# Patient Record
Sex: Male | Born: 1950
Health system: Southern US, Community
[De-identification: ages and names within clinical notes are randomized; demographics above are authoritative.]

## PROBLEM LIST (undated history)

## (undated) DIAGNOSIS — J449 Chronic obstructive pulmonary disease, unspecified: Secondary | ICD-10-CM

## (undated) DIAGNOSIS — M1990 Unspecified osteoarthritis, unspecified site: Secondary | ICD-10-CM

## (undated) DIAGNOSIS — B1920 Unspecified viral hepatitis C without hepatic coma: Secondary | ICD-10-CM

## (undated) DIAGNOSIS — I10 Essential (primary) hypertension: Secondary | ICD-10-CM

## (undated) DIAGNOSIS — M5020 Other cervical disc displacement, unspecified cervical region: Secondary | ICD-10-CM

## (undated) DIAGNOSIS — R519 Headache, unspecified: Secondary | ICD-10-CM

## (undated) DIAGNOSIS — R51 Headache: Secondary | ICD-10-CM

## (undated) HISTORY — PX: KNEE SURGERY: SHX244

---

## 1998-06-09 ENCOUNTER — Ambulatory Visit (HOSPITAL_COMMUNITY): Admission: RE | Admit: 1998-06-09 | Discharge: 1998-06-09 | Payer: Self-pay | Admitting: Gastroenterology

## 1998-06-09 ENCOUNTER — Encounter: Payer: Self-pay | Admitting: Gastroenterology

## 1998-10-21 ENCOUNTER — Emergency Department (HOSPITAL_COMMUNITY): Admission: EM | Admit: 1998-10-21 | Discharge: 1998-10-21 | Payer: Self-pay | Admitting: Emergency Medicine

## 2000-08-06 ENCOUNTER — Encounter: Admission: RE | Admit: 2000-08-06 | Discharge: 2000-08-06 | Payer: Self-pay | Admitting: Infectious Diseases

## 2000-08-27 ENCOUNTER — Encounter: Admission: RE | Admit: 2000-08-27 | Discharge: 2000-08-27 | Payer: Self-pay | Admitting: Infectious Diseases

## 2000-09-16 ENCOUNTER — Encounter: Admission: RE | Admit: 2000-09-16 | Discharge: 2000-09-16 | Payer: Self-pay | Admitting: Infectious Diseases

## 2001-08-10 ENCOUNTER — Encounter: Payer: Self-pay | Admitting: Orthopedic Surgery

## 2001-08-10 ENCOUNTER — Encounter: Admission: RE | Admit: 2001-08-10 | Discharge: 2001-08-10 | Payer: Self-pay | Admitting: Orthopedic Surgery

## 2006-07-21 ENCOUNTER — Emergency Department (HOSPITAL_COMMUNITY): Admission: EM | Admit: 2006-07-21 | Discharge: 2006-07-21 | Payer: Self-pay | Admitting: Emergency Medicine

## 2006-07-23 ENCOUNTER — Emergency Department (HOSPITAL_COMMUNITY): Admission: EM | Admit: 2006-07-23 | Discharge: 2006-07-23 | Payer: Self-pay | Admitting: Emergency Medicine

## 2007-09-07 ENCOUNTER — Ambulatory Visit (HOSPITAL_COMMUNITY): Admission: RE | Admit: 2007-09-07 | Discharge: 2007-09-07 | Payer: Self-pay | Admitting: Family Medicine

## 2009-08-17 ENCOUNTER — Ambulatory Visit (HOSPITAL_COMMUNITY): Admission: RE | Admit: 2009-08-17 | Discharge: 2009-08-17 | Payer: Self-pay | Admitting: Family Medicine

## 2009-10-04 ENCOUNTER — Ambulatory Visit (HOSPITAL_COMMUNITY): Admission: RE | Admit: 2009-10-04 | Discharge: 2009-10-04 | Payer: Self-pay | Admitting: Gastroenterology

## 2010-01-15 ENCOUNTER — Emergency Department (HOSPITAL_COMMUNITY): Admission: EM | Admit: 2010-01-15 | Discharge: 2010-01-15 | Payer: Self-pay | Admitting: Emergency Medicine

## 2010-05-10 ENCOUNTER — Emergency Department (HOSPITAL_COMMUNITY): Admission: EM | Admit: 2010-05-10 | Discharge: 2010-05-10 | Payer: Self-pay | Admitting: Emergency Medicine

## 2010-10-11 ENCOUNTER — Ambulatory Visit: Admit: 2010-10-11 | Payer: Self-pay | Admitting: Gastroenterology

## 2010-10-11 ENCOUNTER — Ambulatory Visit: Payer: Self-pay | Admitting: Gastroenterology

## 2010-11-22 LAB — CBC
HCT: 37.5 % — ABNORMAL LOW (ref 39.0–52.0)
Hemoglobin: 12.5 g/dL — ABNORMAL LOW (ref 13.0–17.0)
RDW: 15.1 % (ref 11.5–15.5)
WBC: 7.9 10*3/uL (ref 4.0–10.5)

## 2010-11-22 LAB — DIFFERENTIAL
Basophils Absolute: 0.1 10*3/uL (ref 0.0–0.1)
Lymphocytes Relative: 46 % (ref 12–46)
Monocytes Absolute: 0.8 10*3/uL (ref 0.1–1.0)
Monocytes Relative: 10 % (ref 3–12)
Neutro Abs: 3.3 10*3/uL (ref 1.7–7.7)

## 2010-11-22 LAB — BASIC METABOLIC PANEL
GFR calc Af Amer: >60 mL/min (ref >60)
GFR calc non Af Amer: 52 mL/min — ABNORMAL LOW (ref >60)
Potassium: 4 mEq/L (ref 3.5–5.1)
Sodium: 139 mEq/L (ref 135–145)

## 2010-11-22 LAB — TRICYCLICS SCREEN, URINE: TCA Scrn: NOT DETECTED

## 2010-11-22 LAB — RAPID URINE DRUG SCREEN, HOSP PERFORMED
Benzodiazepines: POSITIVE — AB
Cocaine: POSITIVE — AB

## 2010-11-22 LAB — ETHANOL: Alcohol, Ethyl (B): <5 mg/dL (ref 0–10)

## 2012-05-07 ENCOUNTER — Ambulatory Visit: Payer: Self-pay | Admitting: Gastroenterology

## 2012-12-22 ENCOUNTER — Other Ambulatory Visit (HOSPITAL_COMMUNITY): Payer: Self-pay | Admitting: Sports Medicine

## 2012-12-22 DIAGNOSIS — M5104 Intervertebral disc disorders with myelopathy, thoracic region: Secondary | ICD-10-CM

## 2012-12-24 ENCOUNTER — Ambulatory Visit (HOSPITAL_COMMUNITY): Admission: RE | Admit: 2012-12-24 | Payer: 59 | Source: Ambulatory Visit

## 2012-12-30 ENCOUNTER — Ambulatory Visit (HOSPITAL_COMMUNITY): Admission: RE | Admit: 2012-12-30 | Payer: 59 | Source: Ambulatory Visit

## 2013-07-06 ENCOUNTER — Other Ambulatory Visit (HOSPITAL_COMMUNITY): Payer: Self-pay | Admitting: Family Medicine

## 2013-07-06 DIAGNOSIS — R296 Repeated falls: Secondary | ICD-10-CM

## 2013-07-06 DIAGNOSIS — R269 Unspecified abnormalities of gait and mobility: Secondary | ICD-10-CM

## 2013-07-06 DIAGNOSIS — R413 Other amnesia: Secondary | ICD-10-CM

## 2013-07-09 ENCOUNTER — Ambulatory Visit (HOSPITAL_COMMUNITY): Admission: RE | Admit: 2013-07-09 | Payer: 59 | Source: Ambulatory Visit

## 2013-07-12 ENCOUNTER — Ambulatory Visit (HOSPITAL_COMMUNITY)
Admission: RE | Admit: 2013-07-12 | Discharge: 2013-07-12 | Disposition: A | Discharge status: Home / Self Care | Payer: 59 | Source: Ambulatory Visit | Attending: Family Medicine | Admitting: Family Medicine

## 2013-07-12 DIAGNOSIS — R279 Unspecified lack of coordination: Secondary | ICD-10-CM | POA: Insufficient documentation

## 2013-07-12 DIAGNOSIS — R296 Repeated falls: Secondary | ICD-10-CM

## 2013-07-12 DIAGNOSIS — R413 Other amnesia: Secondary | ICD-10-CM | POA: Insufficient documentation

## 2013-07-12 DIAGNOSIS — R42 Dizziness and giddiness: Secondary | ICD-10-CM | POA: Insufficient documentation

## 2013-07-12 DIAGNOSIS — R269 Unspecified abnormalities of gait and mobility: Secondary | ICD-10-CM

## 2013-07-14 ENCOUNTER — Ambulatory Visit (HOSPITAL_COMMUNITY): Payer: 59

## 2013-11-05 ENCOUNTER — Emergency Department (HOSPITAL_COMMUNITY)
Admission: EM | Admit: 2013-11-05 | Discharge: 2013-11-05 | Disposition: A | Discharge status: Home / Self Care | Payer: 59 | Attending: Emergency Medicine | Admitting: Emergency Medicine

## 2013-11-05 ENCOUNTER — Encounter (HOSPITAL_COMMUNITY): Payer: Self-pay | Admitting: Emergency Medicine

## 2013-11-05 DIAGNOSIS — I10 Essential (primary) hypertension: Secondary | ICD-10-CM | POA: Insufficient documentation

## 2013-11-05 DIAGNOSIS — L03119 Cellulitis of unspecified part of limb: Principal | ICD-10-CM

## 2013-11-05 DIAGNOSIS — Z87891 Personal history of nicotine dependence: Secondary | ICD-10-CM | POA: Insufficient documentation

## 2013-11-05 DIAGNOSIS — Z23 Encounter for immunization: Secondary | ICD-10-CM | POA: Insufficient documentation

## 2013-11-05 DIAGNOSIS — Z79899 Other long term (current) drug therapy: Secondary | ICD-10-CM | POA: Insufficient documentation

## 2013-11-05 DIAGNOSIS — L02419 Cutaneous abscess of limb, unspecified: Secondary | ICD-10-CM | POA: Insufficient documentation

## 2013-11-05 HISTORY — DX: Essential (primary) hypertension: I10

## 2013-11-05 MED ORDER — CLINDAMYCIN HCL 300 MG PO CAPS
300.0000 mg | ORAL_CAPSULE | Freq: Once | ORAL | Status: AC
  Administered 2013-11-05: 300 mg via ORAL
  Filled 2013-11-05: qty 1

## 2013-11-05 MED ORDER — CLINDAMYCIN HCL 300 MG PO CAPS: 300.0000 mg | ORAL_CAPSULE | Freq: Four times a day (QID) | ORAL | Status: DC

## 2013-11-05 MED ORDER — TETANUS-DIPHTH-ACELL PERTUSSIS 5-2.5-18.5 LF-MCG/0.5 IM SUSP
0.5000 mL | Freq: Once | INTRAMUSCULAR | Status: AC
  Administered 2013-11-05: 0.5 mL via INTRAMUSCULAR
  Filled 2013-11-05: qty 0.5

## 2013-11-05 NOTE — ED Notes (Signed)
Pt reports that yesterday he developed two abscesses to the the right inner, lower leg. Pt states he has attempted to drain them,which he reports he was able to produce a significant amount of white drainage. Pt denies fever, vitals on arrival are WDL.

## 2013-11-05 NOTE — ED Provider Notes (Signed)
CSN: [id number]     Arrival date & time 11/05/13  2706 History  This chart was scribed for non-physician practitioner, Hazel Sams, PA-C,working with Kathalene Frames, MD, by Marlowe Kays, ED Scribe.  This patient was seen in room WTR5/WTR5 and the patient's care was started at 10:29 PM.  Chief Complaint  Patient presents with  . Abscess   The history is provided by the patient. No language interpreter was used.   HPI Comments:  Travis Salinas is a 63 y.o. male who presents to the Emergency Department complaining of two worsening abscesses on his medial right calf that started earlier this morning. Pt reports applying warm compresses and puncturing the area with a needle. He reports producing a large amount of brownish, yellowish purulent drainage. Pt reports moderate throbbing pain and erythema to the area. Pt denies fever, chills, streaking or diaphoresis. Pt denies any injury or trauma to the area. Pt denies h/o abscesses. Pt reports h/o depression. Pt states he is allergic to Ciprofloxacin.    Past Medical History  Diagnosis Date  . Hypertension    History reviewed. No pertinent past surgical history. No family history on file. History  Substance Use Topics  . Smoking status: Former Smoker -- 0.50 packs/day for 25 years    Types: Cigarettes    Quit date: 09/09/2000  . Smokeless tobacco: Never Used  . Alcohol Use: No    Review of Systems  Constitutional: Negative for fever, chills and diaphoresis.  Skin: Positive for color change (erythematous abscess to medial right calf).  All other systems reviewed and are negative.   Allergies  Ciprofloxacin and Depakote  Home Medications   Current Outpatient Rx  Name  Route  Sig  Dispense  Refill  . alprazolam (XANAX) 2 MG tablet   Oral   Take 2 mg by mouth 3 (three) times daily as needed for sleep or anxiety.         Marland Kitchen amLODipine (NORVASC) 10 MG tablet   Oral   Take 10 mg by mouth every morning.         . ARIPiprazole  (ABILIFY) 10 MG tablet   Oral   Take 10 mg by mouth every morning.         Marland Kitchen FLUoxetine (PROZAC) 10 MG capsule   Oral   Take 10 mg by mouth every morning.         . OxyCODONE (OXYCONTIN) 40 mg T12A 12 hr tablet   Oral   Take 40 mg by mouth every 12 (twelve) hours.          Triage Vitals: BP 138/74  Pulse 87  Temp(Src) 98.6 F (37 C) (Oral)  Resp 16  SpO2 95% Physical Exam  Nursing note and vitals reviewed. Constitutional: He is oriented to person, place, and time. He appears well-developed and well-nourished.  HENT:  Head: Normocephalic and atraumatic.  Eyes: EOM are normal.  Neck: Normal range of motion.  Cardiovascular: Normal rate.   Pulmonary/Chest: Effort normal.  Musculoskeletal: Normal range of motion.  Neurological: He is alert and oriented to person, place, and time.  Skin: Skin is warm and dry. There is erythema.  Medial aspect of RLE and calf area with large area of induration and erythema. Two nodular areas present, each with dried blood present.   Psychiatric: He has a normal mood and affect. His behavior is normal.    ED Course  Procedures   DIAGNOSTIC STUDIES: Oxygen Saturation is 95% on RA, normal by  my interpretation.   COORDINATION OF CARE: 10:35 PM- Bedside ultrasound showed two separate pockets of infection. Will I and D the abscesses and prescribe antibiotics. Informed pt that the area needs to be rechecked in two days. Informed pt to return earlier if symptoms worsen. Pt verbalizes understanding and agrees to plan.  INCISION AND DRAINAGE PROCEDURE NOTE: Patient identification was confirmed and verbal consent was obtained. This procedure was performed by Hazel Sams, PA-C at 10:38 PM. Site: right medial calf Sterile procedures observed  Anesthetic used (type and amt): Lidocaine 2% with Epinephrine (5 mL) Drainage: small to moderate Complexity: Complex Site anesthetized, incision made over site, wound drained and explored loculations,  rinsed with copious amounts of normal saline, wound packed with sterile gauze, covered with dry, sterile dressing.  Pt tolerated procedure well without complications.  Instructions for care discussed verbally and pt provided with additional written instructions for homecare and f/u.  Medications  clindamycin (CLEOCIN) capsule 300 mg (not administered)    MDM   Final diagnoses:  Cellulitis and abscess of leg      I personally performed the services described in this documentation, which was scribed in my presence. The recorded information has been reviewed and is accurate.    Martie Lee, PA-C 11/06/13 [phone]

## 2013-11-05 NOTE — ED Notes (Signed)
Pt has redness and swelling to RLE. Pt has two red raised abscessed areas to RLE. Pt states symptoms started 3 days ago. Pt ambulatory to exam room with steady gait.

## 2013-11-05 NOTE — Discharge Instructions (Signed)
You were seen and treated for a skin infection of your lower leg. Keep your leg elevated to reduce swelling. Use warm compresses for 20-30 minutes at a time. Take the antibiotics as prescribed. Have a recheck of your infection in 2 days. Return sooner for any worsening symptoms, fever, chills or sweats.    Abscess An abscess is an infected area that contains a collection of pus and debris.It can occur in almost any part of the body. An abscess is also known as a furuncle or boil. CAUSES  An abscess occurs when tissue gets infected. This can occur from blockage of oil or sweat glands, infection of hair follicles, or a minor injury to the skin. As the body tries to fight the infection, pus collects in the area and creates pressure under the skin. This pressure causes pain. People with weakened immune systems have difficulty fighting infections and get certain abscesses more often.  SYMPTOMS Usually an abscess develops on the skin and becomes a painful mass that is red, warm, and tender. If the abscess forms under the skin, you may feel a moveable soft area under the skin. Some abscesses break open (rupture) on their own, but most will continue to get worse without care. The infection can spread deeper into the body and eventually into the bloodstream, causing you to feel ill.  DIAGNOSIS  Your caregiver will take your medical history and perform a physical exam. A sample of fluid may also be taken from the abscess to determine what is causing your infection. TREATMENT  Your caregiver may prescribe antibiotic medicines to fight the infection. However, taking antibiotics alone usually does not cure an abscess. Your caregiver may need to make a small cut (incision) in the abscess to drain the pus. In some cases, gauze is packed into the abscess to reduce pain and to continue draining the area. HOME CARE INSTRUCTIONS   Only take over-the-counter or prescription medicines for pain, discomfort, or fever as  directed by your caregiver.  If you were prescribed antibiotics, take them as directed. Finish them even if you start to feel better.  If gauze is used, follow your caregiver's directions for changing the gauze.  To avoid spreading the infection:  Keep your draining abscess covered with a bandage.  Wash your hands well.  Do not share personal care items, towels, or whirlpools with others.  Avoid skin contact with others.  Keep your skin and clothes clean around the abscess.  Keep all follow-up appointments as directed by your caregiver. SEEK MEDICAL CARE IF:   You have increased pain, swelling, redness, fluid drainage, or bleeding.  You have muscle aches, chills, or a general ill feeling.  You have a fever. MAKE SURE YOU:   Understand these instructions.  Will watch your condition.  Will get help right away if you are not doing well or get worse. Document Released: 06/05/2005 Document Revised: 02/25/2012 Document Reviewed: 11/08/2011 [facility] Patient Information 2014 [place].    Cellulitis Cellulitis is an infection of the skin and the tissue beneath it. The infected area is usually red and tender. Cellulitis occurs most often in the arms and lower legs.  CAUSES  Cellulitis is caused by bacteria that enter the skin through cracks or cuts in the skin. The most common types of bacteria that cause cellulitis are Staphylococcus and Streptococcus. SYMPTOMS   Redness and warmth.  Swelling.  Tenderness or pain.  Fever. DIAGNOSIS  Your caregiver can usually determine what is wrong based on a  physical exam. Blood tests may also be done. TREATMENT  Treatment usually involves taking an antibiotic medicine. HOME CARE INSTRUCTIONS   Take your antibiotics as directed. Finish them even if you start to feel better.  Keep the infected arm or leg elevated to reduce swelling.  Apply a warm cloth to the affected area up to 4 times per day to relieve pain.  Only take  over-the-counter or prescription medicines for pain, discomfort, or fever as directed by your caregiver.  Keep all follow-up appointments as directed by your caregiver. SEEK MEDICAL CARE IF:   You notice red streaks coming from the infected area.  Your red area gets larger or turns dark in color.  Your bone or joint underneath the infected area becomes painful after the skin has healed.  Your infection returns in the same area or another area.  You notice a swollen bump in the infected area.  You develop new symptoms. SEEK IMMEDIATE MEDICAL CARE IF:   You have a fever.  You feel very sleepy.  You develop vomiting or diarrhea.  You have a general ill feeling (malaise) with muscle aches and pains. MAKE SURE YOU:   Understand these instructions.  Will watch your condition.  Will get help right away if you are not doing well or get worse. Document Released: 06/05/2005 Document Revised: 02/25/2012 Document Reviewed: 11/11/2011 [facility] Patient Information 2014 [place].

## 2013-11-06 NOTE — ED Provider Notes (Signed)
Medical screening examination/treatment/procedure(s) were performed by non-physician practitioner and as supervising physician I was immediately available for consultation/collaboration.    Kathalene Frames, MD 11/06/13 [phone]

## 2013-11-11 ENCOUNTER — Inpatient Hospital Stay (HOSPITAL_COMMUNITY)
Admission: EM | Admit: 2013-11-11 | Discharge: 2013-11-16 | DRG: 581 | Disposition: A | Discharge status: Home / Self Care | Payer: 59 | Attending: Internal Medicine | Admitting: Internal Medicine

## 2013-11-11 ENCOUNTER — Encounter (HOSPITAL_COMMUNITY): Payer: Self-pay | Admitting: Emergency Medicine

## 2013-11-11 DIAGNOSIS — Z888 Allergy status to other drugs, medicaments and biological substances status: Secondary | ICD-10-CM

## 2013-11-11 DIAGNOSIS — L02419 Cutaneous abscess of limb, unspecified: Principal | ICD-10-CM | POA: Diagnosis present

## 2013-11-11 DIAGNOSIS — L03119 Cellulitis of unspecified part of limb: Principal | ICD-10-CM

## 2013-11-11 DIAGNOSIS — I10 Essential (primary) hypertension: Secondary | ICD-10-CM | POA: Diagnosis present

## 2013-11-11 DIAGNOSIS — Z87891 Personal history of nicotine dependence: Secondary | ICD-10-CM

## 2013-11-11 DIAGNOSIS — F418 Other specified anxiety disorders: Secondary | ICD-10-CM

## 2013-11-11 DIAGNOSIS — F329 Major depressive disorder, single episode, unspecified: Secondary | ICD-10-CM | POA: Diagnosis present

## 2013-11-11 DIAGNOSIS — F3289 Other specified depressive episodes: Secondary | ICD-10-CM | POA: Diagnosis present

## 2013-11-11 DIAGNOSIS — F32A Depression, unspecified: Secondary | ICD-10-CM

## 2013-11-11 DIAGNOSIS — Z881 Allergy status to other antibiotic agents status: Secondary | ICD-10-CM

## 2013-11-11 DIAGNOSIS — Z79899 Other long term (current) drug therapy: Secondary | ICD-10-CM

## 2013-11-11 DIAGNOSIS — L0390 Cellulitis, unspecified: Secondary | ICD-10-CM | POA: Diagnosis present

## 2013-11-11 LAB — CBC WITH DIFFERENTIAL/PLATELET
BASOS ABS: 0 10*3/uL (ref 0.0–0.1)
Basophils Relative: 0 % (ref 0–1)
Eosinophils Absolute: 0.4 10*3/uL (ref 0.0–0.7)
Eosinophils Relative: 2 % (ref 0–5)
HEMATOCRIT: 34 % — AB (ref 39.0–52.0)
Hemoglobin: 10.6 g/dL — ABNORMAL LOW (ref 13.0–17.0)
LYMPHS ABS: 3.5 10*3/uL (ref 0.7–4.0)
LYMPHS PCT: 15 % (ref 12–46)
MCH: 25.9 pg — ABNORMAL LOW (ref 26.0–34.0)
MCHC: 31.2 g/dL (ref 30.0–36.0)
MCV: 82.9 fL (ref 78.0–100.0)
Monocytes Absolute: 3.5 10*3/uL — ABNORMAL HIGH (ref 0.1–1.0)
Monocytes Relative: 15 % — ABNORMAL HIGH (ref 3–12)
NEUTROS ABS: 16.4 10*3/uL — AB (ref 1.7–7.7)
Neutrophils Relative %: 69 % (ref 43–77)
PLATELETS: 474 10*3/uL — AB (ref 150–400)
RBC: 4.1 MIL/uL — AB (ref 4.22–5.81)
RDW: 14.9 % (ref 11.5–15.5)
WBC: 23.7 10*3/uL — AB (ref 4.0–10.5)

## 2013-11-11 LAB — COMPREHENSIVE METABOLIC PANEL
ALK PHOS: 160 U/L — AB (ref 39–117)
ALT: 27 U/L (ref 0–53)
AST: 44 U/L — AB (ref 0–37)
Albumin: 2.9 g/dL — ABNORMAL LOW (ref 3.5–5.2)
BILIRUBIN TOTAL: 0.8 mg/dL (ref 0.3–1.2)
BUN: 22 mg/dL (ref 6–23)
CHLORIDE: 100 meq/L (ref 96–112)
CO2: 26 meq/L (ref 19–32)
Calcium: 9.4 mg/dL (ref 8.4–10.5)
Creatinine, Ser: 1.03 mg/dL (ref 0.50–1.35)
GFR calc Af Amer: 89 mL/min — ABNORMAL LOW (ref >90)
GFR, EST NON AFRICAN AMERICAN: 76 mL/min — AB (ref >90)
GLUCOSE: 97 mg/dL (ref 70–99)
POTASSIUM: 4.6 meq/L (ref 3.7–5.3)
SODIUM: 139 meq/L (ref 137–147)
Total Protein: 7.3 g/dL (ref 6.0–8.3)

## 2013-11-11 MED ORDER — DEXTROSE 5 % IV SOLN
1.0000 g | INTRAVENOUS | Status: DC
  Administered 2013-11-11: 1 g via INTRAVENOUS
  Filled 2013-11-11: qty 10

## 2013-11-11 NOTE — ED Provider Notes (Signed)
CSN: [id number]     Arrival date & time 11/11/13  63 History   First MD Initiated Contact with Patient 11/11/13 1930     Chief Complaint  Patient presents with  . Cellulitis     (Consider location/radiation/quality/duration/timing/severity/associated sxs/prior Treatment) Patient is a 63 y.o. male presenting with leg pain. The history is provided by the patient. No language interpreter was used.  Leg Pain Location:  Leg Injury: no   Leg location:  R leg Pain details:    Quality:  Aching   Radiates to:  Does not radiate   Severity:  Moderate   Onset quality:  Gradual   Duration:  7 days   Timing:  Constant Chronicity:  New Worsened by:  Nothing tried Ineffective treatments:  None tried Associated symptoms: swelling   Pt complains of   Past Medical History  Diagnosis Date  . Hypertension    History reviewed. No pertinent past surgical history. History reviewed. No pertinent family history. History  Substance Use Topics  . Smoking status: Former Smoker -- 0.50 packs/day for 25 years    Types: Cigarettes    Quit date: 09/09/2000  . Smokeless tobacco: Never Used  . Alcohol Use: No    Review of Systems  Musculoskeletal: Positive for joint swelling and myalgias.  All other systems reviewed and are negative.      Allergies  Ciprofloxacin and Depakote  Home Medications   Current Outpatient Rx  Name  Route  Sig  Dispense  Refill  . amLODipine (NORVASC) 10 MG tablet   Oral   Take 10 mg by mouth every morning.         . ARIPiprazole (ABILIFY) 10 MG tablet   Oral   Take 10 mg by mouth every morning.         Marland Kitchen atenolol (TENORMIN) 25 MG tablet   Oral   Take 25 mg by mouth at bedtime.         . clindamycin (CLEOCIN) 300 MG capsule   Oral   Take 1 capsule (300 mg total) by mouth 4 (four) times daily. X 7 days   28 capsule   0   . FLUoxetine (PROZAC) 10 MG capsule   Oral   Take 10 mg by mouth at bedtime.          . lamoTRIgine (LAMICTAL) 100 MG  tablet   Oral   Take 50-150 mg by mouth See admin instructions. 50mg  in AM, 150mg  in PM         . naproxen sodium (ANAPROX) 220 MG tablet   Oral   Take 440 mg by mouth as needed (headache/ pain).         . QUEtiapine (SEROQUEL) 50 MG tablet   Oral   Take 50 mg by mouth at bedtime as needed (sleep).          BP 130/65  Pulse 86  Temp(Src) 98.5 F (36.9 C) (Oral)  Resp 14  SpO2 95% Physical Exam  Nursing note and vitals reviewed. Constitutional: He appears well-developed and well-nourished.  HENT:  Head: Normocephalic and atraumatic.  Right Ear: External ear normal.  Nose: Nose normal.  Mouth/Throat: Oropharynx is clear and moist.  Eyes: Conjunctivae are normal. Pupils are equal, round, and reactive to light.  Neck: Normal range of motion. Neck supple.  Cardiovascular: Normal rate and regular rhythm.   Pulmonary/Chest: Effort normal and breath sounds normal.  Abdominal: Soft.  Musculoskeletal: He exhibits tenderness.  Neurological: He is alert.  Skin: Skin  is warm.  Psychiatric: He has a normal mood and affect.    ED Course  Procedures (including critical care time) Labs Review Labs Reviewed  CBC WITH DIFFERENTIAL - Abnormal; Notable for the following:    WBC 23.7 (*)    RBC 4.10 (*)    Hemoglobin 10.6 (*)    HCT 34.0 (*)    MCH 25.9 (*)    Platelets 474 (*)    Neutro Abs 16.4 (*)    Monocytes Relative 15 (*)    Monocytes Absolute 3.5 (*)    All other components within normal limits  COMPREHENSIVE METABOLIC PANEL - Abnormal; Notable for the following:    Albumin 2.9 (*)    AST 44 (*)    Alkaline Phosphatase 160 (*)    GFR calc non Af Amer 76 (*)    GFR calc Af Amer 89 (*)    All other components within normal limits   Imaging Review No results found.   EKG Interpretation None      MDM   Final diagnoses:  Cellulitis  Depression  HTN (hypertension)        Fransico Meadow, PA-C 11/12/13 [phone]

## 2013-11-11 NOTE — ED Notes (Signed)
Per pt, sent from MD with leg sores on rt leg.  MD wants him checked for DVT.  Had recent same infection in December

## 2013-11-12 ENCOUNTER — Encounter (HOSPITAL_COMMUNITY): Payer: Self-pay | Admitting: Anesthesiology

## 2013-11-12 ENCOUNTER — Encounter (HOSPITAL_COMMUNITY)
Admission: EM | Disposition: A | Discharge status: Home / Self Care | Payer: Self-pay | Source: Home / Self Care | Attending: Internal Medicine

## 2013-11-12 ENCOUNTER — Inpatient Hospital Stay (HOSPITAL_COMMUNITY): Payer: 59

## 2013-11-12 ENCOUNTER — Encounter (HOSPITAL_COMMUNITY): Payer: 59 | Admitting: Anesthesiology

## 2013-11-12 ENCOUNTER — Inpatient Hospital Stay (HOSPITAL_COMMUNITY): Payer: 59 | Admitting: Anesthesiology

## 2013-11-12 DIAGNOSIS — L02419 Cutaneous abscess of limb, unspecified: Secondary | ICD-10-CM | POA: Diagnosis present

## 2013-11-12 DIAGNOSIS — M7989 Other specified soft tissue disorders: Secondary | ICD-10-CM

## 2013-11-12 DIAGNOSIS — L03119 Cellulitis of unspecified part of limb: Secondary | ICD-10-CM

## 2013-11-12 DIAGNOSIS — L0390 Cellulitis, unspecified: Secondary | ICD-10-CM | POA: Diagnosis present

## 2013-11-12 DIAGNOSIS — F3289 Other specified depressive episodes: Secondary | ICD-10-CM

## 2013-11-12 DIAGNOSIS — M79609 Pain in unspecified limb: Secondary | ICD-10-CM

## 2013-11-12 DIAGNOSIS — F329 Major depressive disorder, single episode, unspecified: Secondary | ICD-10-CM

## 2013-11-12 DIAGNOSIS — I10 Essential (primary) hypertension: Secondary | ICD-10-CM

## 2013-11-12 DIAGNOSIS — L0291 Cutaneous abscess, unspecified: Secondary | ICD-10-CM

## 2013-11-12 DIAGNOSIS — F418 Other specified anxiety disorders: Secondary | ICD-10-CM

## 2013-11-12 HISTORY — PX: INCISION AND DRAINAGE ABSCESS: SHX5864

## 2013-11-12 LAB — CBC
HCT: 34.1 % — ABNORMAL LOW (ref 39.0–52.0)
HEMOGLOBIN: 11 g/dL — AB (ref 13.0–17.0)
MCH: 26.7 pg (ref 26.0–34.0)
MCHC: 32.3 g/dL (ref 30.0–36.0)
MCV: 82.8 fL (ref 78.0–100.0)
Platelets: 488 10*3/uL — ABNORMAL HIGH (ref 150–400)
RBC: 4.12 MIL/uL — AB (ref 4.22–5.81)
RDW: 15 % (ref 11.5–15.5)
WBC: 23.6 10*3/uL — ABNORMAL HIGH (ref 4.0–10.5)

## 2013-11-12 LAB — BASIC METABOLIC PANEL
BUN: 22 mg/dL (ref 6–23)
CALCIUM: 9 mg/dL (ref 8.4–10.5)
CO2: 24 meq/L (ref 19–32)
Chloride: 101 mEq/L (ref 96–112)
Creatinine, Ser: 1.07 mg/dL (ref 0.50–1.35)
GFR calc Af Amer: 85 mL/min — ABNORMAL LOW (ref >90)
GFR calc non Af Amer: 73 mL/min — ABNORMAL LOW (ref >90)
GLUCOSE: 110 mg/dL — AB (ref 70–99)
POTASSIUM: 4.3 meq/L (ref 3.7–5.3)
SODIUM: 139 meq/L (ref 137–147)

## 2013-11-12 SURGERY — INCISION AND DRAINAGE, ABSCESS
Anesthesia: General | Site: Leg Lower | Laterality: Right

## 2013-11-12 MED ORDER — HYDROMORPHONE HCL PF 1 MG/ML IJ SOLN
1.0000 mg | INTRAMUSCULAR | Status: DC | PRN
  Administered 2013-11-12 – 2013-11-16 (×7): 1 mg via INTRAVENOUS
  Filled 2013-11-12 (×7): qty 1

## 2013-11-12 MED ORDER — LACTATED RINGERS IV SOLN: INTRAVENOUS | Status: DC

## 2013-11-12 MED ORDER — KCL IN DEXTROSE-NACL 20-5-0.45 MEQ/L-%-% IV SOLN
INTRAVENOUS | Status: DC
  Filled 2013-11-12 (×3): qty 1000

## 2013-11-12 MED ORDER — MORPHINE SULFATE 2 MG/ML IJ SOLN
2.0000 mg | INTRAMUSCULAR | Status: DC | PRN
  Administered 2013-11-12 – 2013-11-13 (×2): 2 mg via INTRAVENOUS
  Filled 2013-11-12 (×2): qty 1

## 2013-11-12 MED ORDER — SODIUM CHLORIDE 0.9 % IV SOLN
INTRAVENOUS | Status: AC
  Filled 2013-11-12: qty 1.5

## 2013-11-12 MED ORDER — AMPICILLIN-SULBACTAM SODIUM 1.5 (1-0.5) G IJ SOLR
1.5000 g | Freq: Four times a day (QID) | INTRAMUSCULAR | Status: DC
  Administered 2013-11-12 – 2013-11-16 (×17): 1.5 g via INTRAVENOUS
  Filled 2013-11-12 (×22): qty 1.5

## 2013-11-12 MED ORDER — LAMOTRIGINE 25 MG PO TABS
50.0000 mg | ORAL_TABLET | Freq: Every day | ORAL | Status: DC
Start: 2013-11-12 — End: 2013-11-16
  Administered 2013-11-13 – 2013-11-16 (×4): 50 mg via ORAL
  Filled 2013-11-12 (×5): qty 2

## 2013-11-12 MED ORDER — HYDROMORPHONE HCL PF 2 MG/ML IJ SOLN
INTRAMUSCULAR | Status: AC
  Filled 2013-11-12: qty 1

## 2013-11-12 MED ORDER — ONDANSETRON HCL 4 MG PO TABS: 4.0000 mg | ORAL_TABLET | Freq: Four times a day (QID) | ORAL | Status: DC | PRN

## 2013-11-12 MED ORDER — HYDROMORPHONE HCL PF 1 MG/ML IJ SOLN
INTRAMUSCULAR | Status: DC | PRN
  Administered 2013-11-12 (×2): 1 mg via INTRAVENOUS

## 2013-11-12 MED ORDER — PROMETHAZINE HCL 25 MG/ML IJ SOLN: 6.2500 mg | INTRAMUSCULAR | Status: DC | PRN

## 2013-11-12 MED ORDER — AMLODIPINE BESYLATE 10 MG PO TABS
10.0000 mg | ORAL_TABLET | Freq: Every morning | ORAL | Status: DC
  Administered 2013-11-13 – 2013-11-16 (×4): 10 mg via ORAL
  Filled 2013-11-12 (×5): qty 1

## 2013-11-12 MED ORDER — SODIUM CHLORIDE 0.9 % IV SOLN
INTRAVENOUS | Status: DC
  Administered 2013-11-12: 03:00:00 via INTRAVENOUS

## 2013-11-12 MED ORDER — LACTATED RINGERS IV SOLN
INTRAVENOUS | Status: DC | PRN
  Administered 2013-11-12: 12:00:00 via INTRAVENOUS

## 2013-11-12 MED ORDER — MAGNESIUM CITRATE PO SOLN: 1.0000 | Freq: Once | ORAL | Status: AC | PRN

## 2013-11-12 MED ORDER — ACETAMINOPHEN 10 MG/ML IV SOLN
1000.0000 mg | Freq: Once | INTRAVENOUS | Status: AC
  Administered 2013-11-12: 1000 mg via INTRAVENOUS
  Filled 2013-11-12: qty 100

## 2013-11-12 MED ORDER — ONDANSETRON HCL 4 MG/2ML IJ SOLN
INTRAMUSCULAR | Status: AC
  Filled 2013-11-12: qty 2

## 2013-11-12 MED ORDER — PROPOFOL 10 MG/ML IV BOLUS
INTRAVENOUS | Status: DC | PRN
  Administered 2013-11-12: 200 mg via INTRAVENOUS

## 2013-11-12 MED ORDER — MIDAZOLAM HCL 2 MG/2ML IJ SOLN
INTRAMUSCULAR | Status: AC
  Filled 2013-11-12: qty 2

## 2013-11-12 MED ORDER — ATENOLOL 25 MG PO TABS
25.0000 mg | ORAL_TABLET | Freq: Every day | ORAL | Status: DC
  Administered 2013-11-12 – 2013-11-15 (×4): 25 mg via ORAL
  Filled 2013-11-12 (×5): qty 1

## 2013-11-12 MED ORDER — ONDANSETRON HCL 4 MG/2ML IJ SOLN: 4.0000 mg | Freq: Four times a day (QID) | INTRAMUSCULAR | Status: DC | PRN

## 2013-11-12 MED ORDER — ACETAMINOPHEN 325 MG PO TABS
650.0000 mg | ORAL_TABLET | Freq: Four times a day (QID) | ORAL | Status: DC | PRN
Start: 2013-11-12 — End: 2013-11-16
  Administered 2013-11-14 – 2013-11-15 (×2): 650 mg via ORAL
  Filled 2013-11-12 (×2): qty 2

## 2013-11-12 MED ORDER — FLUOXETINE HCL 10 MG PO CAPS
10.0000 mg | ORAL_CAPSULE | Freq: Every day | ORAL | Status: DC
  Administered 2013-11-12 – 2013-11-15 (×4): 10 mg via ORAL
  Filled 2013-11-12 (×5): qty 1

## 2013-11-12 MED ORDER — HYDROCODONE-ACETAMINOPHEN 5-325 MG PO TABS
1.0000 | ORAL_TABLET | ORAL | Status: DC | PRN
  Administered 2013-11-12 – 2013-11-14 (×5): 2 via ORAL
  Filled 2013-11-12 (×7): qty 2

## 2013-11-12 MED ORDER — OXYCODONE HCL 5 MG PO TABS: 5.0000 mg | ORAL_TABLET | ORAL | Status: DC | PRN

## 2013-11-12 MED ORDER — ENOXAPARIN SODIUM 40 MG/0.4ML ~~LOC~~ SOLN
40.0000 mg | SUBCUTANEOUS | Status: DC
  Administered 2013-11-12 – 2013-11-15 (×4): 40 mg via SUBCUTANEOUS
  Filled 2013-11-12 (×5): qty 0.4

## 2013-11-12 MED ORDER — PROPOFOL 10 MG/ML IV BOLUS
INTRAVENOUS | Status: AC
  Filled 2013-11-12: qty 20

## 2013-11-12 MED ORDER — SENNA 8.6 MG PO TABS
1.0000 | ORAL_TABLET | Freq: Two times a day (BID) | ORAL | Status: DC
Start: 2013-11-12 — End: 2013-11-16
  Administered 2013-11-12 – 2013-11-16 (×6): 8.6 mg via ORAL

## 2013-11-12 MED ORDER — VANCOMYCIN HCL IN DEXTROSE 1-5 GM/200ML-% IV SOLN
1000.0000 mg | Freq: Two times a day (BID) | INTRAVENOUS | Status: DC
  Administered 2013-11-12 – 2013-11-15 (×6): 1000 mg via INTRAVENOUS
  Filled 2013-11-12 (×7): qty 200

## 2013-11-12 MED ORDER — ACETAMINOPHEN 650 MG RE SUPP: 650.0000 mg | Freq: Four times a day (QID) | RECTAL | Status: DC | PRN

## 2013-11-12 MED ORDER — BISACODYL 10 MG RE SUPP: 10.0000 mg | Freq: Every day | RECTAL | Status: DC | PRN

## 2013-11-12 MED ORDER — KCL IN DEXTROSE-NACL 20-5-0.45 MEQ/L-%-% IV SOLN
INTRAVENOUS | Status: AC
  Filled 2013-11-12: qty 1000

## 2013-11-12 MED ORDER — DOCUSATE SODIUM 100 MG PO CAPS
100.0000 mg | ORAL_CAPSULE | Freq: Two times a day (BID) | ORAL | Status: DC
  Administered 2013-11-12 – 2013-11-15 (×6): 100 mg via ORAL

## 2013-11-12 MED ORDER — LAMOTRIGINE 150 MG PO TABS
150.0000 mg | ORAL_TABLET | Freq: Every day | ORAL | Status: DC
  Administered 2013-11-12 – 2013-11-15 (×4): 150 mg via ORAL
  Filled 2013-11-12 (×5): qty 1

## 2013-11-12 MED ORDER — 0.9 % SODIUM CHLORIDE (POUR BTL) OPTIME
TOPICAL | Status: DC | PRN
  Administered 2013-11-12: 1000 mL

## 2013-11-12 MED ORDER — MIDAZOLAM HCL 5 MG/5ML IJ SOLN
INTRAMUSCULAR | Status: DC | PRN
  Administered 2013-11-12: 2 mg via INTRAVENOUS

## 2013-11-12 MED ORDER — ALUM & MAG HYDROXIDE-SIMETH 200-200-20 MG/5ML PO SUSP: 30.0000 mL | Freq: Four times a day (QID) | ORAL | Status: DC | PRN

## 2013-11-12 MED ORDER — VANCOMYCIN HCL IN DEXTROSE 750-5 MG/150ML-% IV SOLN: 750.0000 mg | Freq: Two times a day (BID) | INTRAVENOUS | Status: DC

## 2013-11-12 MED ORDER — FENTANYL CITRATE 0.05 MG/ML IJ SOLN
INTRAMUSCULAR | Status: AC
  Filled 2013-11-12: qty 2

## 2013-11-12 MED ORDER — QUETIAPINE FUMARATE 50 MG PO TABS
50.0000 mg | ORAL_TABLET | Freq: Every evening | ORAL | Status: DC | PRN
  Administered 2013-11-12 – 2013-11-14 (×2): 50 mg via ORAL
  Filled 2013-11-12 (×2): qty 1

## 2013-11-12 MED ORDER — HEPARIN (PORCINE) IN NACL 100-0.45 UNIT/ML-% IJ SOLN
1100.0000 [IU]/h | INTRAMUSCULAR | Status: AC
  Administered 2013-11-12: 1100 [IU]/h via INTRAVENOUS
  Filled 2013-11-12 (×3): qty 250

## 2013-11-12 MED ORDER — FENTANYL CITRATE 0.05 MG/ML IJ SOLN
INTRAMUSCULAR | Status: DC | PRN
  Administered 2013-11-12 (×2): 25 ug via INTRAVENOUS
  Administered 2013-11-12: 50 ug via INTRAVENOUS
  Administered 2013-11-12: 25 ug via INTRAVENOUS
  Administered 2013-11-12: 50 ug via INTRAVENOUS
  Administered 2013-11-12: 25 ug via INTRAVENOUS

## 2013-11-12 MED ORDER — HEPARIN (PORCINE) IN NACL 100-0.45 UNIT/ML-% IJ SOLN: 1100.0000 [IU]/h | INTRAMUSCULAR | Status: DC

## 2013-11-12 MED ORDER — FENTANYL CITRATE 0.05 MG/ML IJ SOLN
25.0000 ug | INTRAMUSCULAR | Status: DC | PRN
  Administered 2013-11-12: 25 ug via INTRAVENOUS
  Administered 2013-11-12: 50 ug via INTRAVENOUS
  Administered 2013-11-12: 25 ug via INTRAVENOUS
  Administered 2013-11-12: 50 ug via INTRAVENOUS

## 2013-11-12 MED ORDER — HEPARIN BOLUS VIA INFUSION
2000.0000 [IU] | Freq: Once | INTRAVENOUS | Status: AC
  Administered 2013-11-12: 2000 [IU] via INTRAVENOUS
  Filled 2013-11-12: qty 2000

## 2013-11-12 MED ORDER — METHOCARBAMOL 100 MG/ML IJ SOLN
500.0000 mg | Freq: Once | INTRAVENOUS | Status: AC
  Administered 2013-11-12: 500 mg via INTRAVENOUS
  Filled 2013-11-12: qty 5

## 2013-11-12 MED ORDER — METOPROLOL TARTRATE 1 MG/ML IV SOLN
INTRAVENOUS | Status: DC | PRN
  Administered 2013-11-12: 1 mg via INTRAVENOUS

## 2013-11-12 MED ORDER — VANCOMYCIN HCL IN DEXTROSE 1-5 GM/200ML-% IV SOLN
1000.0000 mg | Freq: Once | INTRAVENOUS | Status: AC
  Administered 2013-11-12: 1000 mg via INTRAVENOUS
  Filled 2013-11-12: qty 200

## 2013-11-12 MED ORDER — ONDANSETRON HCL 4 MG/2ML IJ SOLN
INTRAMUSCULAR | Status: DC | PRN
  Administered 2013-11-12: 4 mg via INTRAVENOUS

## 2013-11-12 MED ORDER — ARIPIPRAZOLE 10 MG PO TABS
10.0000 mg | ORAL_TABLET | Freq: Every morning | ORAL | Status: DC
  Administered 2013-11-13 – 2013-11-16 (×4): 10 mg via ORAL
  Filled 2013-11-12 (×5): qty 1

## 2013-11-12 SURGICAL SUPPLY — 32 items
BANDAGE ELASTIC 6 VELCRO ST LF (GAUZE/BANDAGES/DRESSINGS) ×3 IMPLANT
BANDAGE GAUZE ELAST BULKY 4 IN (GAUZE/BANDAGES/DRESSINGS) ×3 IMPLANT
BLADE SURG 15 STRL LF DISP TIS (BLADE) ×1 IMPLANT
BLADE SURG 15 STRL SS (BLADE) ×2
BNDG GAUZE ELAST 4 BULKY (GAUZE/BANDAGES/DRESSINGS) ×3 IMPLANT
CANISTER SUCTION 2500CC (MISCELLANEOUS) ×3 IMPLANT
COVER SURGICAL LIGHT HANDLE (MISCELLANEOUS) ×3 IMPLANT
DECANTER SPIKE VIAL GLASS SM (MISCELLANEOUS) IMPLANT
DRAPE LAPAROSCOPIC ABDOMINAL (DRAPES) ×3 IMPLANT
ELECT REM PT RETURN 9FT ADLT (ELECTROSURGICAL) ×3
ELECTRODE REM PT RTRN 9FT ADLT (ELECTROSURGICAL) ×1 IMPLANT
EVACUATOR 1/8 PVC DRAIN (DRAIN) ×3 IMPLANT
GAUZE XEROFORM 1X8 LF (GAUZE/BANDAGES/DRESSINGS) ×3 IMPLANT
GLOVE BIO SURGEON STRL SZ7.5 (GLOVE) ×3 IMPLANT
GOWN STRL REUS W/TWL LRG LVL3 (GOWN DISPOSABLE) ×6 IMPLANT
KIT BASIN OR (CUSTOM PROCEDURE TRAY) ×3 IMPLANT
NEEDLE HYPO 25X1 1.5 SAFETY (NEEDLE) ×3 IMPLANT
NS IRRIG 1000ML POUR BTL (IV SOLUTION) ×3 IMPLANT
PAD ABD 8X10 STRL (GAUZE/BANDAGES/DRESSINGS) ×3 IMPLANT
PENCIL BUTTON HOLSTER BLD 10FT (ELECTRODE) ×3 IMPLANT
SPONGE GAUZE 4X4 12PLY (GAUZE/BANDAGES/DRESSINGS) ×3 IMPLANT
SPONGE LAP 18X18 X RAY DECT (DISPOSABLE) ×3 IMPLANT
SUT MNCRL AB 4-0 PS2 18 (SUTURE) IMPLANT
SUT VIC AB 3-0 SH 27 (SUTURE) ×2
SUT VIC AB 3-0 SH 27XBRD (SUTURE) ×1 IMPLANT
SWAB COLLECTION DEVICE MRSA (MISCELLANEOUS) ×3 IMPLANT
SYR BULB 3OZ (MISCELLANEOUS) ×3 IMPLANT
SYR CONTROL 10ML LL (SYRINGE) ×3 IMPLANT
TOWEL OR 17X26 10 PK STRL BLUE (TOWEL DISPOSABLE) ×3 IMPLANT
TUBE ANAEROBIC SPECIMEN COL (MISCELLANEOUS) ×3 IMPLANT
WATER STERILE IRR 1000ML POUR (IV SOLUTION) IMPLANT
YANKAUER SUCT BULB TIP NO VENT (SUCTIONS) ×3 IMPLANT

## 2013-11-12 NOTE — Anesthesia Preprocedure Evaluation (Signed)
Anesthesia Evaluation  Patient identified by MRN, date of birth, ID band Patient awake    Reviewed: Allergy & Precautions, H&P , NPO status , Patient's Chart, lab work & pertinent test results  Airway Mallampati: II TM Distance: >3 FB Neck ROM: Full    Dental no notable dental hx.    Pulmonary neg pulmonary ROS, former smoker,  breath sounds clear to auscultation  Pulmonary exam normal       Cardiovascular hypertension, Pt. on medications Rhythm:Regular Rate:Normal     Neuro/Psych Depression negative neurological ROS     GI/Hepatic negative GI ROS, Neg liver ROS,   Endo/Other  negative endocrine ROS  Renal/GU negative Renal ROS  negative genitourinary   Musculoskeletal negative musculoskeletal ROS (+)   Abdominal   Peds negative pediatric ROS (+)  Hematology  (+) anemia ,   Anesthesia Other Findings   Reproductive/Obstetrics negative OB ROS                           Anesthesia Physical Anesthesia Plan  ASA: II  Anesthesia Plan: General   Post-op Pain Management:    Induction: Intravenous  Airway Management Planned: Oral ETT and LMA  Additional Equipment:   Intra-op Plan:   Post-operative Plan: Extubation in OR  Informed Consent: I have reviewed the patients History and Physical, chart, labs and discussed the procedure including the risks, benefits and alternatives for the proposed anesthesia with the patient or authorized representative who has indicated his/her understanding and acceptance.   Dental advisory given  Plan Discussed with: CRNA and Surgeon  Anesthesia Plan Comments:         Anesthesia Quick Evaluation

## 2013-11-12 NOTE — Consult Note (Signed)
Reason for Consult:  Possible right calf abscess Referring Physician: Dr. Lottie Rater Travis Salinas is an 63 y.o. male.  HPI:  Pt is a 63 yo male who presents with several days worsening right calf pain.  Consultation is requested form Dr,. Elgergawy regarding this problem.  He has had some cellulitis on and off of this calf in the last few months, requiring small I&D of right inner calf in ED.  He has also been on antibiotics.  He presents with profound swelling and tightness of right calf, with increased symptoms while ambulating.  He denies fevers/ chills.  He states that he has had drainage from the lower scab in the last few days.  He has not had significant redness of the area recently, but has in the past few months.    Past Medical History  Diagnosis Date  . Hypertension     History reviewed. No pertinent past surgical history.  History reviewed. No pertinent family history.  Social History:  reports that he quit smoking about 13 years ago. His smoking use included Cigarettes. He has a 12.5 pack-year smoking history. He has never used smokeless tobacco. He reports that he does not drink alcohol or use illicit drugs.  Allergies:  Allergies  Allergen Reactions  . Ciprofloxacin Nausea Only  . Depakote [Divalproex Sodium] Other (See Comments)    hallucinations    Medications: I have reviewed the patient's current medications.  Results for orders placed during the hospital encounter of 11/11/13 (from the past 48 hour(s))  CBC WITH DIFFERENTIAL     Status: Abnormal   Collection Time    11/11/13  8:50 PM      Result Value Ref Range   WBC 23.7 (*) 4.0 - 10.5 K/uL   RBC 4.10 (*) 4.22 - 5.81 MIL/uL   Hemoglobin 10.6 (*) 13.0 - 17.0 g/dL   HCT 34.0 (*) 39.0 - 52.0 %   MCV 82.9  78.0 - 100.0 fL   MCH 25.9 (*) 26.0 - 34.0 pg   MCHC 31.2  30.0 - 36.0 g/dL   RDW 14.9  11.5 - 15.5 %   Platelets 474 (*) 150 - 400 K/uL   Neutrophils Relative % 69  43 - 77 %   Neutro Abs 16.4 (*)  1.7 - 7.7 K/uL   Lymphocytes Relative 15  12 - 46 %   Lymphs Abs 3.5  0.7 - 4.0 K/uL   Monocytes Relative 15 (*) 3 - 12 %   Monocytes Absolute 3.5 (*) 0.1 - 1.0 K/uL   Eosinophils Relative 2  0 - 5 %   Eosinophils Absolute 0.4  0.0 - 0.7 K/uL   Basophils Relative 0  0 - 1 %   Basophils Absolute 0.0  0.0 - 0.1 K/uL  COMPREHENSIVE METABOLIC PANEL     Status: Abnormal   Collection Time    11/11/13  8:50 PM      Result Value Ref Range   Sodium 139  137 - 147 mEq/L   Potassium 4.6  3.7 - 5.3 mEq/L   Chloride 100  96 - 112 mEq/L   CO2 26  19 - 32 mEq/L   Glucose, Bld 97  70 - 99 mg/dL   BUN 22  6 - 23 mg/dL   Creatinine, Ser 1.03  0.50 - 1.35 mg/dL   Calcium 9.4  8.4 - 10.5 mg/dL   Total Protein 7.3  6.0 - 8.3 g/dL   Albumin 2.9 (*) 3.5 - 5.2 g/dL  AST 44 (*) 0 - 37 U/L   ALT 27  0 - 53 U/L   Alkaline Phosphatase 160 (*) 39 - 117 U/L   Total Bilirubin 0.8  0.3 - 1.2 mg/dL   GFR calc non Af Amer 76 (*) >90 mL/min   GFR calc Af Amer 89 (*) >90 mL/min   Comment: (NOTE)     The eGFR has been calculated using the CKD EPI equation.     This calculation has not been validated in all clinical situations.     eGFR's persistently <90 mL/min signify possible Chronic Kidney     Disease.  CBC     Status: Abnormal   Collection Time    11/12/13  4:35 AM      Result Value Ref Range   WBC 23.6 (*) 4.0 - 10.5 K/uL   RBC 4.12 (*) 4.22 - 5.81 MIL/uL   Hemoglobin 11.0 (*) 13.0 - 17.0 g/dL   HCT 34.1 (*) 39.0 - 52.0 %   MCV 82.8  78.0 - 100.0 fL   MCH 26.7  26.0 - 34.0 pg   MCHC 32.3  30.0 - 36.0 g/dL   RDW 15.0  11.5 - 15.5 %   Platelets 488 (*) 150 - 400 K/uL  BASIC METABOLIC PANEL     Status: Abnormal   Collection Time    11/12/13  4:35 AM      Result Value Ref Range   Sodium 139  137 - 147 mEq/L   Potassium 4.3  3.7 - 5.3 mEq/L   Chloride 101  96 - 112 mEq/L   CO2 24  19 - 32 mEq/L   Glucose, Bld 110 (*) 70 - 99 mg/dL   BUN 22  6 - 23 mg/dL   Creatinine, Ser 1.07  0.50 - 1.35 mg/dL     Calcium 9.0  8.4 - 10.5 mg/dL   GFR calc non Af Amer 73 (*) >90 mL/min   GFR calc Af Amer 85 (*) >90 mL/min   Comment: (NOTE)     The eGFR has been calculated using the CKD EPI equation.     This calculation has not been validated in all clinical situations.     eGFR's persistently <90 mL/min signify possible Chronic Kidney     Disease.    [place] Extrem Low Right Ltd  11/12/2013   CLINICAL DATA:  Cellulitis.  Rule out abscess.  EXAM: ULTRASOUND right calf LOWER EXTREMITY LIMITED  TECHNIQUE: Ultrasound examination of the lower extremity soft tissues was performed in the area of clinical concern.  COMPARISON:  None.  FINDINGS: Within the right calf adjacent to visible sores, there is a multiloculated complex fluid collection spanning over 11.3 x 4.7 x 6.8 cm. In the present clinical setting, this is suspicious for abscess. By ultrasound, other consideration would include hematoma or primary mass.  IMPRESSION: Abscess right calf suspected as detailed above.   Electronically Signed   By: Chauncey Cruel M.D.   On: 11/12/2013 01:50    Review of Systems  Musculoskeletal:       Right calf pain, difficulty walking  Skin:       Lesions on right inner calf.   All other systems reviewed and are negative.   Blood pressure 118/64, pulse 83, temperature 98.5 F (36.9 C), temperature source Oral, resp. rate 20, height 6' (1.829 m), weight 156 lb (70.761 kg), SpO2 91.00%. Physical Exam  Constitutional: He is oriented to person, place, and time. He appears well-developed and well-nourished. He appears  distressed.  HENT:  Head: Normocephalic and atraumatic.  Eyes: Pupils are equal, round, and reactive to light. No scleral icterus.  Neck: Neck supple.  Cardiovascular: Normal rate, regular rhythm and intact distal pulses.   Respiratory: Effort normal. No respiratory distress.  GI: Soft. He exhibits no distension.  Musculoskeletal: He exhibits tenderness (tight tense calf).  Right calf very tense and  swollen, tender.  Faint diffuse erythema over calf, no fluctuance.  No expressible drainage from eschar areas.    Neurological: He is alert and oriented to person, place, and time.  Skin: Skin is warm and dry. He is not diaphoretic. There is erythema.     Right calf much larger than left calf.  Two eschars on right medial calf  Psychiatric: He has a normal mood and affect. His behavior is normal. Judgment and thought content normal.    Assessment/Plan: Right calf abscess  This clinically appears to be deep given the lack of fluctuance.   Recommend 3d imaging with CT or MRI to better assess location of abscess.   Recommend orthopaedic consult.   Discussed with Dr. Harlow Asa who would be general surgery contact today.    Travis Salinas 11/12/2013, 8:28 AM

## 2013-11-12 NOTE — Progress Notes (Signed)
ANTICOAGULATION CONSULT NOTE - Initial Consult  Pharmacy Consult for Heparin Indication: r/o DVT  Allergies  Allergen Reactions  . Ciprofloxacin Nausea Only  . Depakote [Divalproex Sodium] Other (See Comments)    hallucinations    Patient Measurements: Height: 6' (182.9 cm) Weight: 156 lb (70.761 kg) IBW/kg (Calculated) : 77.6 Heparin Dosing Weight:   Vital Signs: Temp: 99 F (37.2 C) (03/06 0215) Temp src: Oral (03/06 0215) BP: 124/67 mmHg (03/06 0215) Pulse Rate: 84 (03/06 0215)  Labs:  Recent Labs  11/11/13 2050  HGB 10.6*  HCT 34.0*  PLT 474*  CREATININE 1.03    Estimated Creatinine Clearance: 75.4 ml/min (by C-G formula based on Cr of 1.03).   Medical History: Past Medical History  Diagnosis Date  . Hypertension     Medications:  Infusions:  . sodium chloride 75 mL/hr at 11/12/13 0230  . heparin 1,100 Units/hr (11/12/13 0230)    Assessment: Patient with possible DVT, MD wants heparin started then stopped at 0600 3/6.    Goal of Therapy:  Heparin level 0.3-0.7 units/ml Monitor platelets by anticoagulation protocol: Yes   Plan:  Heparin bolus 2000 units iv x1 Heparin drip at 1100 units/hr to stop at 0600 3/6 Daily heparin level and CBC if heparin to continue Called RN to verify stop time     Nani Skillern Crowford 11/12/2013,3:25 AM

## 2013-11-12 NOTE — Progress Notes (Signed)
Patient right lower ext [place] is showing loculated abscess, will go for Sx in am, D/W Dr Barry Dienes, keep NPO after 2:00, will change lovenox to heparin drip,(will hold hearin drip after 6:00 am in anticipation of Sx,(venous doppler is pending to R/O DVT.

## 2013-11-12 NOTE — Progress Notes (Signed)
PHARMACY BRIEF NOTE:   VANCOMYCIN  Because patient has persistent abscess rather than a simple cellulitis, vancomycin trough of 15-20 would be optimal.  Have therefore increased vancomycin dosage to 1 gram IV q12h.     Will follow serum creatinine, clinical course. Will check vancomycin trough at steady-state if ongoing vancomycin therapy is required following surgical drainage.  Clayburn Pert, PharmD, BCPS Pager: [phone] 11/12/2013  8:55 AM

## 2013-11-12 NOTE — Transfer of Care (Signed)
Immediate Anesthesia Transfer of Care Note  Patient: Travis Salinas  Procedure(s) Performed: Procedure(s) (LRB): INCISION AND DRAINAGE AND OPEN PACKING OF RIGHT CALF  ABSCESS (Right)  Patient Location: PACU  Anesthesia Type: General  Level of Consciousness: sedated, patient cooperative and responds to stimulation  Airway & Oxygen Therapy: Patient Spontanous Breathing and Patient connected to face mask oxgen  Post-op Assessment: Report given to PACU RN and Post -op Vital signs reviewed and stable  Post vital signs: Reviewed and stable  Complications: No apparent anesthesia complications

## 2013-11-12 NOTE — Progress Notes (Signed)
VASCULAR LAB PRELIMINARY  PRELIMINARY  PRELIMINARY  PRELIMINARY  Right lower extremity venous duplex completed.    Preliminary report:  Right:  No evidence of DVT, superficial thrombosis, or Baker's cyst.  Unable to visualize calf veins at the area of abscess, but they are patent above and below abscess.  Tatyana Biber, RVT 11/12/2013, 8:39 AM

## 2013-11-12 NOTE — Brief Op Note (Signed)
11/11/2013 - 11/12/2013  12:51 PM  PATIENT:  Travis Salinas  63 y.o. male  PRE-OPERATIVE DIAGNOSIS:  right calf abscess  POST-OPERATIVE DIAGNOSIS:  right calf abscess  PROCEDURE:  1. Wound exploration and debridement right medial calf - skin, subcutaneous tissue, and muscle  2. Drainage of abscess with HemoVac drain  SURGEON:  Surgeon(s) and Role:    * Earnstine Regal, MD - Primary      Dr. Zollie Beckers - Co-Surgeon  ANESTHESIA:   general  EBL:     BLOOD ADMINISTERED:none  DRAINS: (1) Hemovact drain(s) in the posterior compartment with  Suction Open   LOCAL MEDICATIONS USED:  NONE  SPECIMEN:  Source of Specimen:  aerobic and anaerobic cultures from abscess cavity  DISPOSITION OF SPECIMEN:  laboratory  COUNTS:  YES  TOURNIQUET:  * No tourniquets in log *  DICTATION: .Other Dictation: Dictation Number [id number]  PLAN OF CARE: Admit for overnight observation  PATIENT DISPOSITION:  PACU - hemodynamically stable.   Delay start of Pharmacological VTE agent (>24hrs) due to surgical blood loss or risk of bleeding: yes  Earnstine Regal, MD, [facility] Office: [phone]

## 2013-11-12 NOTE — H&P (Signed)
Patient Demographics  Travis Salinas, is a 63 y.o. male  MRN: [id number]   DOB - 1950/10/08  Admit Date - 11/11/2013  Outpatient Primary MD for the patient is [name], MD   With History of -  Past Medical History  Diagnosis Date  . Hypertension       History reviewed. No pertinent past surgical history.  in for   Chief Complaint  Patient presents with  . Cellulitis     HPI  Travis Salinas  is a 63 y.o. male, sent by his PCP for worsening right lower extremity edema, erythema, and pain, patient reports he had few episodes of silhouette is over the last 2 months, he has already took 2 courses of by mouth doxycycline in January, as well was in ED few days ago with diagnosis of the cellulitis with an abscess status post I&D in ED, discharge on by mouth clindamycin, patient had no improvement, so his PCP today with worsening swelling and tenderness, unfortunately at time of presentation the vascular lab was already closed and venous Doppler cannot be done to the morning, but patient blood work is significant for leukocytosis at 23,000, patient is afebrile, not tachycardic, started on IV Rocephin in ED, right lower extremity ultrasound is pending to evaluate for abscess.    Review of Systems    In addition to the HPI above,  No Fever-chills, No Headache, No changes with Vision or hearing, No problems swallowing food or Liquids, No Chest pain, Cough or Shortness of Breath, No Abdominal pain, No Nausea or Vommitting, Bowel movements are regular, No Blood in stool or Urine, No dysuria, Has right lower extremity erythema, and swelling and tenderness. No new joints pains-aches,  No new weakness, tingling, numbness in any extremity, No recent weight gain or loss, No polyuria, polydypsia or  polyphagia, No significant Mental Stressors.  A full 10 point Review of Systems was done, except as stated above, all other Review of Systems were negative.   Social History History  Substance Use Topics  . Smoking status: Former Smoker -- 0.50 packs/day for 25 years    Types: Cigarettes    Quit date: 09/09/2000  . Smokeless tobacco: Never Used  . Alcohol Use: No     Family History History reviewed. No pertinent family history.   Prior to Admission medications   Medication Sig Start Date End Date Taking? Authorizing Provider  amLODipine (NORVASC) 10 MG tablet Take 10 mg by mouth every morning.   Yes Historical Provider, MD  ARIPiprazole (ABILIFY) 10 MG tablet Take 10 mg by mouth every morning.   Yes Historical Provider, MD  atenolol (TENORMIN) 25 MG tablet Take 25 mg by mouth at bedtime.   Yes Historical Provider, MD  clindamycin (CLEOCIN) 300 MG capsule Take 1 capsule (300 mg total) by mouth 4 (four) times daily. X 7 days 11/05/13  Yes Ruthell Rummage Dammen, PA-C  FLUoxetine (PROZAC) 10 MG capsule Take 10 mg  by mouth at bedtime.    Yes Historical Provider, MD  lamoTRIgine (LAMICTAL) 100 MG tablet Take 50-150 mg by mouth See admin instructions. 50mg  in AM, 150mg  in PM   Yes Historical Provider, MD  naproxen sodium (ANAPROX) 220 MG tablet Take 440 mg by mouth as needed (headache/ pain).   Yes Historical Provider, MD  QUEtiapine (SEROQUEL) 50 MG tablet Take 50 mg by mouth at bedtime as needed (sleep).   Yes Historical Provider, MD    Allergies  Allergen Reactions  . Ciprofloxacin Nausea Only  . Depakote [Divalproex Sodium] Other (See Comments)    hallucinations    Physical Exam  Vitals  Blood pressure 130/65, pulse 86, temperature 98.5 F (36.9 C), temperature source Oral, resp. rate 14, SpO2 95.00%.   1. General well-nourished male lying in bed in NAD,    2. Normal affect and insight, Not Suicidal or Homicidal, Awake Alert, Oriented X 3.  3. No F.N deficits, ALL C.Nerves  Intact, Strength 5/5 all 4 extremities, Sensation intact all 4 extremities, Plantars down going.  4. Ears and Eyes appear Normal, Conjunctivae clear, PERRLA. Moist Oral Mucosa.  5. Supple Neck, No JVD, No cervical lymphadenopathy appriciated, No Carotid Bruits.  6. Symmetrical Chest wall movement, Good air movement bilaterally, CTAB.  7. RRR, No Gallops, Rubs or Murmurs, No Parasternal Heave.  8. Positive Bowel Sounds, Abdomen Soft, Non tender, No organomegaly appriciated,No rebound -guarding or rigidity.  9.  No Cyanosis, Normal Skin Turgor, has right lower extremity erythema, tenderness, swelling last 2-3,  10. Good muscle tone,  joints appear normal , no effusions, Normal ROM.  11. No Palpable Lymph Nodes in Neck or Axillae    Data Review  CBC  Recent Labs Lab 11/11/13 2050  WBC 23.7*  HGB 10.6*  HCT 34.0*  PLT 474*  MCV 82.9  MCH 25.9*  MCHC 31.2  RDW 14.9  LYMPHSABS 3.5  MONOABS 3.5*  EOSABS 0.4  BASOSABS 0.0   ------------------------------------------------------------------------------------------------------------------  Chemistries   Recent Labs Lab 11/11/13 2050  NA 139  K 4.6  CL 100  CO2 26  GLUCOSE 97  BUN 22  CREATININE 1.03  CALCIUM 9.4  AST 44*  ALT 27  ALKPHOS 160*  BILITOT 0.8   ------------------------------------------------------------------------------------------------------------------ CrCl is unknown because there is no height on file for the current visit. ------------------------------------------------------------------------------------------------------------------ No results found for this basename: TSH, T4TOTAL, FREET3, T3FREE, THYROIDAB,  in the last 72 hours   Coagulation profile No results found for this basename: INR, PROTIME,  in the last 168 hours ------------------------------------------------------------------------------------------------------------------- No results found for this basename: DDIMER,  in  the last 72 hours -------------------------------------------------------------------------------------------------------------------  Cardiac Enzymes No results found for this basename: CK, CKMB, TROPONINI, MYOGLOBIN,  in the last 168 hours ------------------------------------------------------------------------------------------------------------------ No components found with this basename: POCBNP,    ---------------------------------------------------------------------------------------------------------------  Urinalysis No results found for this basename: colorurine, appearanceur, labspec, phurine, glucoseu, hgbur, bilirubinur, ketonesur, proteinur, urobilinogen, nitrite, leukocytesur    ----------------------------------------------------------------------------------------------------------------  Imaging results:   No results found.    Assessment & Plan  Active Problems:   Cellulitis   HTN (hypertension)   Depression    1. right lower extremity cellulitis: Recurrent, as well patient had significant swelling, right lower extremity ultrasound is pending to rule out abscess, if patient has abscess will need IND to be performed, as well will need venous Doppler to be done to rule out DVT, venous Doppler won't be done until the morning, so will start patient empirically on anticoagulation with Lovenox pending  his venous Doppler results in the morning, but will await for his ultrasound results in case he needs IND if he has abscess, meanwhile he'll be started on IV vancomycin and Unasyn. 2. Hypertension. Blood pressure is acceptable, continue with Norvasc 3. History of Depression: Continue with home meds   DVT Prophylaxis lovenox AM Labs Ordered, also please review Full Orders  Family Communication: Admission, patients condition and plan of care including tests being ordered have been discussed with the patient and wife who indicate understanding and agree with the plan and  Code Status.  Code Status Full  Likely DC to  home  Condition GUARDED    Time spent in minutes : 50 min    Tannis Burstein M.D on 11/12/2013 at 12:29 AM   And look for the night coverage person covering me after hours  [facility] Office  [phone]

## 2013-11-12 NOTE — Progress Notes (Signed)
ANTIBIOTIC CONSULT NOTE - INITIAL  Pharmacy Consult for Vancomycin Indication: Cellulitis  Allergies  Allergen Reactions  . Ciprofloxacin Nausea Only  . Depakote [Divalproex Sodium] Other (See Comments)    hallucinations    Patient Measurements: Height: 6' (182.9 cm) Weight: 156 lb (70.761 kg) IBW/kg (Calculated) : 77.6 Adjusted Body Weight:   Vital Signs: Temp: 99 F (37.2 C) (03/06 0215) Temp src: Oral (03/06 0215) BP: 124/67 mmHg (03/06 0215) Pulse Rate: 84 (03/06 0215) Intake/Output from previous day: 03/05 0701 - 03/06 0700 In: -  Out: 300 [Urine:300] Intake/Output from this shift: Total I/O In: -  Out: 300 [Urine:300]  Labs:  Recent Labs  11/11/13 2050  WBC 23.7*  HGB 10.6*  PLT 474*  CREATININE 1.03   Estimated Creatinine Clearance: 75.4 ml/min (by C-G formula based on Cr of 1.03). No results found for this basename: VANCOTROUGH, VANCOPEAK, VANCORANDOM, GENTTROUGH, GENTPEAK, GENTRANDOM, TOBRATROUGH, TOBRAPEAK, TOBRARND, AMIKACINPEAK, AMIKACINTROU, AMIKACIN,  in the last 72 hours   Microbiology: No results found for this or any previous visit (from the past 720 hour(s)).  Medical History: Past Medical History  Diagnosis Date  . Hypertension     Medications:  Anti-infectives   Start     Dose/Rate Route Frequency Ordered Stop   11/12/13 1800  vancomycin (VANCOCIN) IVPB 750 mg/150 ml premix     750 mg 150 mL/hr over 60 Minutes Intravenous Every 12 hours 11/12/13 0329     11/12/13 0045  ampicillin-sulbactam (UNASYN) 1.5 g in sodium chloride 0.9 % 50 mL IVPB     1.5 g 100 mL/hr over 30 Minutes Intravenous 4 times per day 11/12/13 0026     11/12/13 0045  vancomycin (VANCOCIN) IVPB 1000 mg/200 mL premix     1,000 mg 200 mL/hr over 60 Minutes Intravenous  Once 11/12/13 0032     11/11/13 2015  cefTRIAXone (ROCEPHIN) 1 g in dextrose 5 % 50 mL IVPB  Status:  Discontinued     1 g 100 mL/hr over 30 Minutes Intravenous Every 24 hours 11/11/13 2003 11/12/13  0214     Assessment: Patient with cellulitis.  First dose of antibiotics scheduled to start in ED, but not charted or pulled.  Sent missing dose to floor.  Goal of Therapy:  Vancomycin trough level 10-15 mcg/ml  Plan:  Measure antibiotic drug levels at steady state Follow up culture results Vancomycin 750mg  iv q12hr, next dose at [address], [place] 11/12/2013,3:29 AM

## 2013-11-12 NOTE — Progress Notes (Signed)
Patient ID: Travis Salinas, male   DOB: 1951/03/01, 63 y.o.   MRN: [id number]  General Surgery - [facility] - Progress Note  POD# 0  Subjective: Patient seen and examined.  Complains of right calf pain.  Objective: Vital signs in last 24 hours: Temp:  [98.5 F (36.9 C)-99 F (37.2 C)] 98.5 F (36.9 C) (03/06 0552) Pulse Rate:  [82-87] 83 (03/06 0552) Resp:  [14-20] 20 (03/06 0552) BP: (111-130)/(61-67) 118/64 mmHg (03/06 0552) SpO2:  [91 %-95 %] 91 % (03/06 0552) Weight:  [156 lb (70.761 kg)] 156 lb (70.761 kg) (03/06 0118) Last BM Date: 11/10/13  Intake/Output from previous day: 03/05 0701 - 03/06 0700 In: 37.6 [I.V.:37.6] Out: 300 [Urine:300]  Exam: HEENT - clear, not icteric Ext - two dry eschars on medial right calf, tender, +/- fluctuent Neuro - grossly intact, no focal deficits  Lab Results:   Recent Labs  11/11/13 2050 11/12/13 0435  WBC 23.7* 23.6*  HGB 10.6* 11.0*  HCT 34.0* 34.1*  PLT 474* 488*     Recent Labs  11/11/13 2050 11/12/13 0435  NA 139 139  K 4.6 4.3  CL 100 101  CO2 26 24  GLUCOSE 97 110*  BUN 22 22  CREATININE 1.03 1.07  CALCIUM 9.4 9.0    Studies/Results: [place] Extrem Low Right Ltd  11/12/2013   CLINICAL DATA:  Cellulitis.  Rule out abscess.  EXAM: ULTRASOUND right calf LOWER EXTREMITY LIMITED  TECHNIQUE: Ultrasound examination of the lower extremity soft tissues was performed in the area of clinical concern.  COMPARISON:  None.  FINDINGS: Within the right calf adjacent to visible sores, there is a multiloculated complex fluid collection spanning over 11.3 x 4.7 x 6.8 cm. In the present clinical setting, this is suspicious for abscess. By ultrasound, other consideration would include hematoma or primary mass.  IMPRESSION: Abscess right calf suspected as detailed above.   Electronically Signed   By: Chauncey Cruel M.D.   On: 11/12/2013 01:50    Assessment / Plan: 1.  Right calf abscess, loculated,  superficial  Plan incision and drainage and open packing in OR today  The risks and benefits of the procedure have been discussed at length with the patient.  The patient understands the proposed procedure, potential alternative treatments, and the course of recovery to be expected.  All of the patient's questions have been answered at this time.  The patient wishes to proceed with surgery.  Earnstine Regal, MD, [facility] Office: [phone]  11/12/2013

## 2013-11-12 NOTE — Anesthesia Postprocedure Evaluation (Signed)
  Anesthesia Post-op Note  Patient: [place]  Procedure(s) Performed: Procedure(s) (LRB): INCISION AND DRAINAGE AND OPEN PACKING OF RIGHT CALF  ABSCESS (Right)  Patient Location: PACU  Anesthesia Type: General  Level of Consciousness: awake and alert   Airway and Oxygen Therapy: Patient Spontanous Breathing  Post-op Pain: mild  Post-op Assessment: Post-op Vital signs reviewed, Patient's Cardiovascular Status Stable, Respiratory Function Stable, Patent Airway and No signs of Nausea or vomiting  Last Vitals:  Filed Vitals:   11/12/13 1330  BP: 130/61  Pulse: 79  Temp: 36.6 C  Resp: 16    Post-op Vital Signs: stable   Complications: No apparent anesthesia complications

## 2013-11-12 NOTE — Progress Notes (Signed)
UR completed. Nahiara Kretzschmar RN CCM Case Mgmt phone [phone] 

## 2013-11-12 NOTE — Progress Notes (Signed)
[facility] PROGRESS NOTE  Travis Salinas ZOX:[id number] DOB: 1951/05/25 DOA: 11/11/2013 PCP: [name], MD  Assessment/Plan: 63 y.o. male, sent by his PCP for worsening right lower extremity edema, erythema, and pain, patient reports he had few episodes of silhouette is over the last 2 months, he has already took 2 courses of by mouth doxycycline in January, as well was in ED few days ago with diagnosis of the cellulitis with an abscess status post I&D in ED, discharge on by mouth clindamycin, patient had no improvement, so his PCP today with worsening swelling and tenderness is admitted with abscess   Active Problems:  Cellulitis  HTN (hypertension)  Depression   1. Right lower extremity cellulitis, abscess; [place]: Abscess right calf;  -failed outpatient atx;  -pend I&D per surgery; cont IV atx;  2. Hypertension. Blood pressure is acceptable, continue with Norvasc  3. History of Depression: Continue with home meds  DVT prophylaxis; lovenox   Code Status: full Family Communication:  D/w patient,  (indicate person spoken with, relationship, and if by phone, the number) Disposition Plan: home per surgery    Consultants:  Surgery   Procedures:  I&D pend   Antibiotics:  vanc 3/5><<<<  unasyn 3/5>>>>>   (indicate start date, and stop date if known)  HPI/Subjective: alert  Objective: Filed Vitals:   11/12/13 0552  BP: 118/64  Pulse: 83  Temp: 98.5 F (36.9 C)  Resp: 20    Intake/Output Summary (Last 24 hours) at 11/12/13 0936 Last data filed at 11/12/13 0820  Gross per 24 hour  Intake  37.58 ml  Output    300 ml  Net -262.42 ml   Filed Weights   11/12/13 0118  Weight: 70.761 kg (156 lb)    Exam:   General:  alert  Cardiovascular: s1,s2 rrr  Respiratory: CTA BL  Abdomen: soft, nt, nd   Musculoskeletal: no LE edema   Data Reviewed: Basic Metabolic Panel:  Recent Labs Lab 11/11/13 2050 11/12/13 0435  NA 139 139  K 4.6 4.3   CL 100 101  CO2 26 24  GLUCOSE 97 110*  BUN 22 22  CREATININE 1.03 1.07  CALCIUM 9.4 9.0   Liver Function Tests:  Recent Labs Lab 11/11/13 2050  AST 44*  ALT 27  ALKPHOS 160*  BILITOT 0.8  PROT 7.3  ALBUMIN 2.9*   No results found for this basename: LIPASE, AMYLASE,  in the last 168 hours No results found for this basename: AMMONIA,  in the last 168 hours CBC:  Recent Labs Lab 11/11/13 2050 11/12/13 0435  WBC 23.7* 23.6*  NEUTROABS 16.4*  --   HGB 10.6* 11.0*  HCT 34.0* 34.1*  MCV 82.9 82.8  PLT 474* 488*   Cardiac Enzymes: No results found for this basename: CKTOTAL, CKMB, CKMBINDEX, TROPONINI,  in the last 168 hours BNP (last 3 results) No results found for this basename: PROBNP,  in the last 8760 hours CBG: No results found for this basename: GLUCAP,  in the last 168 hours  No results found for this or any previous visit (from the past 240 hour(s)).   Studies: [place] Extrem Low Right Ltd  11/12/2013   CLINICAL DATA:  Cellulitis.  Rule out abscess.  EXAM: ULTRASOUND right calf LOWER EXTREMITY LIMITED  TECHNIQUE: Ultrasound examination of the lower extremity soft tissues was performed in the area of clinical concern.  COMPARISON:  None.  FINDINGS: Within the right calf adjacent to visible sores, there is a multiloculated complex fluid  collection spanning over 11.3 x 4.7 x 6.8 cm. In the present clinical setting, this is suspicious for abscess. By ultrasound, other consideration would include hematoma or primary mass.  IMPRESSION: Abscess right calf suspected as detailed above.   Electronically Signed   By: Chauncey Cruel M.D.   On: 11/12/2013 01:50    Scheduled Meds: . amLODipine  10 mg Oral q morning - 10a  . ampicillin-sulbactam (UNASYN) IV  1.5 g Intravenous 4 times per day  . ARIPiprazole  10 mg Oral q morning - 10a  . atenolol  25 mg Oral QHS  . docusate sodium  100 mg Oral BID  . FLUoxetine  10 mg Oral QHS  . lamoTRIgine  150 mg Oral QHS  . lamoTRIgine  50 mg  Oral Daily  . senna  1 tablet Oral BID  . vancomycin  1,000 mg Intravenous Q12H   Continuous Infusions: . sodium chloride 75 mL/hr at 11/12/13 0230    Principal Problem:   Abscess of calf, right Active Problems:   Cellulitis   HTN (hypertension)   Depression    Time spent: >35 minuets     Kinnie Feil  [facility] Pager [phone]. If 7PM-7AM, please contact night-coverage at [email or web address], password [facility] 11/12/2013, 9:36 AM  LOS: 1 day

## 2013-11-13 LAB — CBC
HCT: 32.6 % — ABNORMAL LOW (ref 39.0–52.0)
HCT: 34.6 % — ABNORMAL LOW (ref 39.0–52.0)
HEMOGLOBIN: 11.1 g/dL — AB (ref 13.0–17.0)
Hemoglobin: 9.9 g/dL — ABNORMAL LOW (ref 13.0–17.0)
MCH: 25.4 pg — AB (ref 26.0–34.0)
MCH: 26.6 pg (ref 26.0–34.0)
MCHC: 30.4 g/dL (ref 30.0–36.0)
MCHC: 32.1 g/dL (ref 30.0–36.0)
MCV: 83.8 fL (ref 78.0–100.0)
MCV: 82.8 fL (ref 78.0–100.0)
PLATELETS: 487 10*3/uL — AB (ref 150–400)
Platelets: 538 10*3/uL — ABNORMAL HIGH (ref 150–400)
RBC: 3.89 MIL/uL — ABNORMAL LOW (ref 4.22–5.81)
RBC: 4.18 MIL/uL — AB (ref 4.22–5.81)
RDW: 14.9 % (ref 11.5–15.5)
RDW: 14.9 % (ref 11.5–15.5)
WBC: 20.1 10*3/uL — ABNORMAL HIGH (ref 4.0–10.5)
WBC: 18.6 10*3/uL — ABNORMAL HIGH (ref 4.0–10.5)

## 2013-11-13 MED ORDER — METHOCARBAMOL 500 MG PO TABS
500.0000 mg | ORAL_TABLET | Freq: Four times a day (QID) | ORAL | Status: DC | PRN
  Administered 2013-11-13 – 2013-11-16 (×5): 500 mg via ORAL
  Filled 2013-11-13 (×5): qty 1

## 2013-11-13 MED ORDER — OXYCODONE HCL 5 MG PO TABS
10.0000 mg | ORAL_TABLET | ORAL | Status: DC | PRN
  Administered 2013-11-13 – 2013-11-16 (×13): 10 mg via ORAL
  Filled 2013-11-13 (×14): qty 2

## 2013-11-13 NOTE — Progress Notes (Signed)
[facility] PROGRESS NOTE  Travis Salinas KPT:[id number] DOB: [birth year] DOA: 11/11/2013 PCP: [name], MD  Assessment/Plan:   Active Problems:  Cellulitis  HTN (hypertension)  Depression   63 y.o. male, sent by his PCP for worsening right lower extremity edema, erythema, and pain, patient reports he had few episodes of silhouette is over the last 2 months, he has already took 2 courses of by mouth doxycycline in January, as well was in ED few days ago with diagnosis of the cellulitis with an abscess status post I&D in ED, discharge on by mouth clindamycin, patient had no improvement, so his PCP today with worsening swelling and tenderness is admitted with abscess   1. Right lower extremity cellulitis, abscess; [place]: Abscess right calf; leukocytosis  -failed outpatient atx;  -s/p I&D per surgery 3/6; pend wound cultures; cont IV atx;  2. Hypertension. Blood pressure is acceptable, continue with Norvasc  3. History of Depression: Continue with home meds  DVT prophylaxis; lovenox   Code Status: full Family Communication:  D/w patient, offered to d/w family he declined  (indicate person spoken with, relationship, and if by phone, the number) Disposition Plan: home per surgery    Consultants:  Surgery   Procedures:  I&D pend   Antibiotics:  vanc 3/5><<<<  unasyn 3/5>>>>>   (indicate start date, and stop date if known)  HPI/Subjective: alert  Objective: Filed Vitals:   11/13/13 1002  BP: 118/70  Pulse: 72  Temp: 98.2 F (36.8 C)  Resp: 16    Intake/Output Summary (Last 24 hours) at 11/13/13 1129 Last data filed at 11/13/13 1003  Gross per 24 hour  Intake   2810 ml  Output   2887 ml  Net    -77 ml   Filed Weights   11/12/13 0118  Weight: 70.761 kg (156 lb)    Exam:   General:  alert  Cardiovascular: s1,s2 rrr  Respiratory: CTA BL  Abdomen: soft, nt, nd   Musculoskeletal: no LE edema   Data Reviewed: Basic Metabolic  Panel:  Recent Labs Lab 11/11/13 2050 11/12/13 0435  NA 139 139  K 4.6 4.3  CL 100 101  CO2 26 24  GLUCOSE 97 110*  BUN 22 22  CREATININE 1.03 1.07  CALCIUM 9.4 9.0   Liver Function Tests:  Recent Labs Lab 11/11/13 2050  AST 44*  ALT 27  ALKPHOS 160*  BILITOT 0.8  PROT 7.3  ALBUMIN 2.9*   No results found for this basename: LIPASE, AMYLASE,  in the last 168 hours No results found for this basename: AMMONIA,  in the last 168 hours CBC:  Recent Labs Lab 11/11/13 2050 11/12/13 0435 11/13/13 0422  WBC 23.7* 23.6* 20.1*  NEUTROABS 16.4*  --   --   HGB 10.6* 11.0* 9.9*  HCT 34.0* 34.1* 32.6*  MCV 82.9 82.8 83.8  PLT 474* 488* 487*   Cardiac Enzymes: No results found for this basename: CKTOTAL, CKMB, CKMBINDEX, TROPONINI,  in the last 168 hours BNP (last 3 results) No results found for this basename: PROBNP,  in the last 8760 hours CBG: No results found for this basename: GLUCAP,  in the last 168 hours  No results found for this or any previous visit (from the past 240 hour(s)).   Studies: [place] Extrem Low Right Ltd  11/12/2013   CLINICAL DATA:  Cellulitis.  Rule out abscess.  EXAM: ULTRASOUND right calf LOWER EXTREMITY LIMITED  TECHNIQUE: Ultrasound examination of the lower extremity soft tissues was performed  in the area of clinical concern.  COMPARISON:  None.  FINDINGS: Within the right calf adjacent to visible sores, there is a multiloculated complex fluid collection spanning over 11.3 x 4.7 x 6.8 cm. In the present clinical setting, this is suspicious for abscess. By ultrasound, other consideration would include hematoma or primary mass.  IMPRESSION: Abscess right calf suspected as detailed above.   Electronically Signed   By: Chauncey Cruel M.D.   On: 11/12/2013 01:50    Scheduled Meds: . amLODipine  10 mg Oral q morning - 10a  . ampicillin-sulbactam (UNASYN) IV  1.5 g Intravenous 4 times per day  . ARIPiprazole  10 mg Oral q morning - 10a  . atenolol  25 mg  Oral QHS  . docusate sodium  100 mg Oral BID  . enoxaparin (LOVENOX) injection  40 mg Subcutaneous Q24H  . FLUoxetine  10 mg Oral QHS  . lamoTRIgine  150 mg Oral QHS  . lamoTRIgine  50 mg Oral Daily  . senna  1 tablet Oral BID  . vancomycin  1,000 mg Intravenous Q12H   Continuous Infusions: . sodium chloride 75 mL/hr at 11/12/13 0230  . dextrose 5 % and 0.45 % NaCl with KCl 20 mEq/L      Principal Problem:   Abscess of calf, right Active Problems:   Cellulitis   HTN (hypertension)   Depression    Time spent: >35 minuets     Kinnie Feil  [facility] Pager [phone]. If 7PM-7AM, please contact night-coverage at [email or web address], password [facility] 11/13/2013, 11:29 AM  LOS: 2 days
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
## 2013-11-13 NOTE — Op Note (Signed)
Travis Salinas, Travis Salinas            ACCOUNT NO.:  [id number]  MEDICAL RECORD NO.:  [id number]  LOCATION:  9417                         FACILITY:  [facility]  PHYSICIAN:  Earnstine Regal, MD      DATE OF BIRTH:  [birth year]  DATE OF PROCEDURE:  11/12/2013                               OPERATIVE REPORT   PREOPERATIVE DIAGNOSIS:  Right calf abscess.  POSTOPERATIVE DIAGNOSIS:  Abscess of deep compartment, right posterior calf.  SURGEON:  Earnstine Regal, MD, FACS  CO-SURGEON:  Lind Guest. Ninfa Linden, MD, Orthopedic Surgery.  ANESTHESIA:  General.  ESTIMATED BLOOD LOSS:  Minimal.  PREPARATION:  ChloraPrep.  COMPLICATIONS:  None.  INDICATIONS:  The patient is a 63 year old male admitted to the Medical Service from the emergency department with an abscess of the right medial calf.  Ultrasound examination showed an 11-cm loculated collection, which appeared to be relatively superficial.  The patient was seen and evaluated by General Surgery and prepared for the operating room.  BODY OF REPORT:  Procedure was done in OR #6 at the [facility].  The patient was brought to the operating room, placed in a supine position on the operating room table.  Following administration of anesthesia, the patient's right leg was placed in a frog-leg position and the medial calf was prepped and draped in the usual aseptic fashion.  After ascertaining that an adequate level of anesthesia had been achieved, 2 small eschars on the medial calf each measuring approximately 1.5 to 2 cm in diameter were debrided with the electrocautery used for hemostasis.  They were excised full-thickness and dissection carried into the deep subcutaneous tissues.  Skin was incised between the 2 lesions down to the underlying muscle fascia.  No significant purulent drainage was identified.  Hemostasis was achieved with the electrocautery.  Palpation reveals infection to be deep within the musculature of  the posterior compartment.  The muscle was incised superficially with the electrocautery.  Using a hemostat, the muscle was explored and the large abscess cavity was entered bluntly.  Muscle was opened for approximately 3-4 cm with the electrocautery and a large volume (80 mL) of creamy yellow purulent fluid was evacuated.  Cavity was explored digitally and loculations were broken up.  Cavity was quite extensive in the posterior compartment of the leg.  At this point, an intraoperative consultation was requested from Dr. Jean Rosenthal from Orthopedic Surgery.  Dr. Ninfa Linden came to the operating room.  After scrubbing into the procedure, Dr. Ninfa Linden explored the wound.  He will dictate his operative report in a separate note.  He was able to find yet another cavity of infection in the anterior portion of the field.  The wound was irrigated.  Hemostasis was achieved with electrocautery.  A Hemovac drain was placed into the wound cavity. Muscle was closed and skin was loosely approximated with sutures. Again, Dr. Ninfa Linden will dictate the details of this portion of the procedure in a separate operative report.  Sterile dressings were applied.  Drain was placed to Hemovac suction. The patient was awakened from anesthesia and brought to the recovery room.  Aerobic and anaerobic cultures were obtained at the time of accessing the abscess  cavity and these were submitted to the laboratory for review.   Earnstine Regal, MD, [facility] Office: [phone]    TMG/MEDQ  D:  11/12/2013  T:  11/13/2013  Job:  [id number]  cc:   Lind Guest. Ninfa Linden, M.D. Fax: [phone]  Kathyrn Lass, M.D. Fax: [phone]

## 2013-11-13 NOTE — Progress Notes (Signed)
1 Day Post-Op  Subjective: Complains of soreness on back of calf but pressure is much better  Objective: Vital signs in last 24 hours: Temp:  [97.2 F (36.2 C)-99 F (37.2 C)] 98.2 F (36.8 C) (03/07 1002) Pulse Rate:  [69-88] 72 (03/07 1002) Resp:  [13-19] 16 (03/07 1002) BP: (105-159)/(50-94) 118/70 mmHg (03/07 1002) SpO2:  [94 %-100 %] 100 % (03/07 1002) Last BM Date: 11/10/13  Intake/Output from previous day: 03/06 0701 - 03/07 0700 In: 2570 [P.O.:720; I.V.:1600; IV Piggyback:250] Out: 2190 [Urine:2125; Drains:65] Intake/Output this shift: Total I/O In: 240 [P.O.:240] Out: 697 [Urine:675; Drains:22]  Resp: clear to auscultation bilaterally Cardio: regular rate and rhythm Extremities: dressing clean. drain output bloody  Lab Results:   Recent Labs  11/12/13 0435 11/13/13 0422  WBC 23.6* 20.1*  HGB 11.0* 9.9*  HCT 34.1* 32.6*  PLT 488* 487*   BMET  Recent Labs  11/11/13 2050 11/12/13 0435  NA 139 139  K 4.6 4.3  CL 100 101  CO2 26 24  GLUCOSE 97 110*  BUN 22 22  CREATININE 1.03 1.07  CALCIUM 9.4 9.0   PT/INR No results found for this basename: LABPROT, INR,  in the last 72 hours ABG No results found for this basename: PHART, PCO2, PO2, HCO3,  in the last 72 hours  Studies/Results: [place] Extrem Low Right Ltd  11/12/2013   CLINICAL DATA:  Cellulitis.  Rule out abscess.  EXAM: ULTRASOUND right calf LOWER EXTREMITY LIMITED  TECHNIQUE: Ultrasound examination of the lower extremity soft tissues was performed in the area of clinical concern.  COMPARISON:  None.  FINDINGS: Within the right calf adjacent to visible sores, there is a multiloculated complex fluid collection spanning over 11.3 x 4.7 x 6.8 cm. In the present clinical setting, this is suspicious for abscess. By ultrasound, other consideration would include hematoma or primary mass.  IMPRESSION: Abscess right calf suspected as detailed above.   Electronically Signed   By: Chauncey Cruel M.D.   On:  11/12/2013 01:50    Anti-infectives: Anti-infectives   Start     Dose/Rate Route Frequency Ordered Stop   11/12/13 1800  vancomycin (VANCOCIN) IVPB 750 mg/150 ml premix  Status:  Discontinued     750 mg 150 mL/hr over 60 Minutes Intravenous Every 12 hours 11/12/13 0329 11/12/13 0856   11/12/13 1600  vancomycin (VANCOCIN) IVPB 1000 mg/200 mL premix     1,000 mg 200 mL/hr over 60 Minutes Intravenous Every 12 hours 11/12/13 0856     11/12/13 0045  ampicillin-sulbactam (UNASYN) 1.5 g in sodium chloride 0.9 % 50 mL IVPB     1.5 g 100 mL/hr over 30 Minutes Intravenous 4 times per day 11/12/13 0026     11/12/13 0045  vancomycin (VANCOCIN) IVPB 1000 mg/200 mL premix     1,000 mg 200 mL/hr over 60 Minutes Intravenous  Once 11/12/13 0032 11/12/13 0506   11/11/13 2015  cefTRIAXone (ROCEPHIN) 1 g in dextrose 5 % 50 mL IVPB  Status:  Discontinued     1 g 100 mL/hr over 30 Minutes Intravenous Every 24 hours 11/11/13 2003 11/12/13 0214      Assessment/Plan: s/p Procedure(s): INCISION AND DRAINAGE AND OPEN PACKING OF RIGHT CALF  ABSCESS (Right) Continue abx Disposition per ortho  LOS: 2 days    TOTH III,Jadi Deyarmin S 11/13/2013

## 2013-11-13 NOTE — Progress Notes (Signed)
Patient ID: Travis Salinas, male   DOB: 05/25/1951, 63 y.o.   MRN: [id number] Postoperative day 1 status post irrigation and debridement abscess right calf. The Hemovac has clear bloody drainage. The Hemovac was removed. Patient will continue his IV antibiotics. Dressing is clean and dry.

## 2013-11-13 NOTE — Progress Notes (Signed)
Patient ID: Travis Salinas, male   DOB: [birth year], 63 y.o.   MRN: [id number] Drain removed.  I took his dressing down.  No redness at all and no drainage.  Suture line intact.  Calf very soft.  WBC still high at 20,000, but hopefully trending down.  Continue IV antibiotics the next 1 - 2 days until improves.

## 2013-11-14 LAB — CBC
HEMATOCRIT: 38.1 % — AB (ref 39.0–52.0)
Hemoglobin: 12 g/dL — ABNORMAL LOW (ref 13.0–17.0)
MCH: 26.2 pg (ref 26.0–34.0)
MCHC: 31.5 g/dL (ref 30.0–36.0)
MCV: 83.2 fL (ref 78.0–100.0)
Platelets: 422 10*3/uL — ABNORMAL HIGH (ref 150–400)
RBC: 4.58 MIL/uL (ref 4.22–5.81)
RDW: 15 % (ref 11.5–15.5)
WBC: 18.6 10*3/uL — ABNORMAL HIGH (ref 4.0–10.5)

## 2013-11-14 LAB — URINALYSIS, ROUTINE W REFLEX MICROSCOPIC
BILIRUBIN URINE: NEGATIVE
Glucose, UA: NEGATIVE mg/dL
HGB URINE DIPSTICK: NEGATIVE
KETONES UR: NEGATIVE mg/dL
Leukocytes, UA: NEGATIVE
Nitrite: NEGATIVE
Protein, ur: NEGATIVE mg/dL
Specific Gravity, Urine: 1.013 (ref 1.005–1.030)
Urobilinogen, UA: 1 mg/dL (ref 0.0–1.0)
pH: 6.5 (ref 5.0–8.0)

## 2013-11-14 NOTE — Progress Notes (Signed)
[facility] PROGRESS NOTE  Travis Salinas SVX:[id number] DOB: 1951/08/26 DOA: 11/11/2013 PCP: [name], MD  Assessment/Plan:   Active Problems:  Cellulitis  HTN (hypertension)  Depression   63 y.o. male, sent by his PCP for worsening right lower extremity edema, erythema, and pain, patient reports he had few episodes of silhouette is over the last 2 months, he has already took 2 courses of by mouth doxycycline in January, as well was in ED few days ago with diagnosis of the cellulitis with an abscess status post I&D in ED, discharge on by mouth clindamycin, patient had no improvement, so his PCP today with worsening swelling and tenderness is admitted with abscess   1. Right lower extremity cellulitis, abscess; [place]: Abscess right calf; leukocytosis  -failed outpatient atx;  -s/p I&D per surgery 3/6; pend final wound cultures; cont IV atx;  2. Hypertension. Blood pressure is acceptable, continue with Norvasc  3. History of Depression: Continue with home meds  DVT prophylaxis; lovenox   Code Status: full Family Communication:  D/w patient, offered to d/w family he declined  (indicate person spoken with, relationship, and if by phone, the number) Disposition Plan: home per surgery    Consultants:  Surgery   Procedures:  I&D pend   Antibiotics:  vanc 3/5><<<<  unasyn 3/5>>>>>   (indicate start date, and stop date if known)  HPI/Subjective: alert  Objective: Filed Vitals:   11/14/13 0544  BP: 132/73  Pulse: 72  Temp: 98.4 F (36.9 C)  Resp: 18    Intake/Output Summary (Last 24 hours) at 11/14/13 1135 Last data filed at 11/14/13 0721  Gross per 24 hour  Intake    840 ml  Output   2375 ml  Net  -1535 ml   Filed Weights   11/12/13 0118  Weight: 70.761 kg (156 lb)    Exam:   General:  alert  Cardiovascular: s1,s2 rrr  Respiratory: CTA BL  Abdomen: soft, nt, nd   Musculoskeletal: no LE edema   Data Reviewed: Basic Metabolic  Panel:  Recent Labs Lab 11/11/13 2050 11/12/13 0435  NA 139 139  K 4.6 4.3  CL 100 101  CO2 26 24  GLUCOSE 97 110*  BUN 22 22  CREATININE 1.03 1.07  CALCIUM 9.4 9.0   Liver Function Tests:  Recent Labs Lab 11/11/13 2050  AST 44*  ALT 27  ALKPHOS 160*  BILITOT 0.8  PROT 7.3  ALBUMIN 2.9*   No results found for this basename: LIPASE, AMYLASE,  in the last 168 hours No results found for this basename: AMMONIA,  in the last 168 hours CBC:  Recent Labs Lab 11/11/13 2050 11/12/13 0435 11/13/13 0422 11/13/13 1353 11/14/13 0538  WBC 23.7* 23.6* 20.1* 18.6* 18.6*  NEUTROABS 16.4*  --   --   --   --   HGB 10.6* 11.0* 9.9* 11.1* 12.0*  HCT 34.0* 34.1* 32.6* 34.6* 38.1*  MCV 82.9 82.8 83.8 82.8 83.2  PLT 474* 488* 487* 538* 422*   Cardiac Enzymes: No results found for this basename: CKTOTAL, CKMB, CKMBINDEX, TROPONINI,  in the last 168 hours BNP (last 3 results) No results found for this basename: PROBNP,  in the last 8760 hours CBG: No results found for this basename: GLUCAP,  in the last 168 hours  Recent Results (from the past 240 hour(s))  CULTURE, ROUTINE-ABSCESS     Status: None   Collection Time    11/12/13 12:13 PM      Result Value Ref  Range Status   Specimen Description ABSCESS RIGHT CALF   Final   Special Requests NONE   Final   Gram Stain PENDING   Incomplete   Culture     Final   Value: Culture reincubated for better growth     Performed at [facility]   Report Status PENDING   Incomplete  ANAEROBIC CULTURE     Status: None   Collection Time    11/12/13 12:13 PM      Result Value Ref Range Status   Specimen Description ABSCESS RIGHT CALF   Final   Special Requests NONE   Final   Gram Stain PENDING   Incomplete   Culture     Final   Value: NO ANAEROBES ISOLATED; CULTURE IN PROGRESS FOR 5 DAYS     Performed at [facility]   Report Status PENDING   Incomplete     Studies: No results found.  Scheduled Meds: . amLODipine   10 mg Oral q morning - 10a  . ampicillin-sulbactam (UNASYN) IV  1.5 g Intravenous 4 times per day  . ARIPiprazole  10 mg Oral q morning - 10a  . atenolol  25 mg Oral QHS  . docusate sodium  100 mg Oral BID  . enoxaparin (LOVENOX) injection  40 mg Subcutaneous Q24H  . FLUoxetine  10 mg Oral QHS  . lamoTRIgine  150 mg Oral QHS  . lamoTRIgine  50 mg Oral Daily  . senna  1 tablet Oral BID  . vancomycin  1,000 mg Intravenous Q12H   Continuous Infusions:    Principal Problem:   Abscess of calf, right Active Problems:   Cellulitis   HTN (hypertension)   Depression    Time spent: >35 minuets     Kinnie Feil  [facility] Pager [phone]. If 7PM-7AM, please contact night-coverage at [email or web address], password [facility] 11/14/2013, 11:35 AM  LOS: 3 days

## 2013-11-14 NOTE — Progress Notes (Signed)
Call received from [place] in pharmacy regarding yesterday's dose of vancomycin not being charted. Epifania Gore rn caring for patient yesterday contacted and she reported hanging drug without scanning. Drug then charted by this rn. Vwilliams,rn.

## 2013-11-14 NOTE — Progress Notes (Signed)
Patient ID: Travis Salinas, male   DOB: [birth year], 63 y.o.   MRN: [id number] Postoperative day 2 status post debridement abscess right thigh. Examination incision is clean and dry. Patient's hemoglobin is improving white blood cell count is decreasing but still elevated. Patient has difficulty with weightbearing. Anticipate once he is safe with ambulation he could be discharged with oral antibiotics.

## 2013-11-15 LAB — CULTURE, ROUTINE-ABSCESS

## 2013-11-15 LAB — BASIC METABOLIC PANEL
BUN: 9 mg/dL (ref 6–23)
CHLORIDE: 106 meq/L (ref 96–112)
CO2: 25 meq/L (ref 19–32)
Calcium: 9 mg/dL (ref 8.4–10.5)
Creatinine, Ser: 1 mg/dL (ref 0.50–1.35)
GFR calc Af Amer: >90 mL/min (ref >90)
GFR calc non Af Amer: 79 mL/min — ABNORMAL LOW (ref >90)
GLUCOSE: 102 mg/dL — AB (ref 70–99)
Potassium: 4.6 mEq/L (ref 3.7–5.3)
Sodium: 143 mEq/L (ref 137–147)

## 2013-11-15 LAB — CBC
HEMATOCRIT: 35.8 % — AB (ref 39.0–52.0)
HEMOGLOBIN: 11.5 g/dL — AB (ref 13.0–17.0)
MCH: 26.4 pg (ref 26.0–34.0)
MCHC: 32.1 g/dL (ref 30.0–36.0)
MCV: 82.3 fL (ref 78.0–100.0)
Platelets: 610 10*3/uL — ABNORMAL HIGH (ref 150–400)
RBC: 4.35 MIL/uL (ref 4.22–5.81)
RDW: 14.8 % (ref 11.5–15.5)
WBC: 12.8 10*3/uL — ABNORMAL HIGH (ref 4.0–10.5)

## 2013-11-15 LAB — VANCOMYCIN, TROUGH: Vancomycin Tr: 14.6 ug/mL (ref 10.0–20.0)

## 2013-11-15 MED ORDER — VANCOMYCIN HCL 10 G IV SOLR
1250.0000 mg | Freq: Two times a day (BID) | INTRAVENOUS | Status: DC
  Administered 2013-11-16: 1250 mg via INTRAVENOUS
  Filled 2013-11-15 (×3): qty 1250

## 2013-11-15 NOTE — Progress Notes (Signed)
   CARE MANAGEMENT NOTE 11/15/2013  Patient:  BALTHAZAR, DOOLY   Account Number:  [id number]  Date Initiated:  11/12/2013  Documentation initiated by:  [facility]  Subjective/Objective Assessment:   abscess on calf, cellulitis     Action/Plan:   going home to stay with wife, [name] [phone]   Anticipated DC Date:  11/17/2013   Anticipated DC Plan:  [place]  CM consult      [facility] Choice  HOME HEALTH   Choice offered to / List presented to:  C-1 Patient        [place] arranged  HH-1 RN  IV Antibiotics      [place].   Status of service:  Completed, signed off Medicare Important Message given?   (If response is "NO", the following Medicare IM given date fields will be blank) Date Medicare IM given:   Date Additional Medicare IM given:    Discharge Disposition:  [place]  Per UR Regulation:    If discussed at Long Length of Stay Meetings, dates discussed:    Comments:  11/15/2013 1420 NCM spoke to pt and offered choice for [facility]. Pt agreeable to [facility] for HH. Waiting for final recommendation for HH and IV abx. Pt states he is going to his wife's home after dc. She will be able to assist with administering IV abx. Jonnie Finner RN CCM Case Mgmt phone [phone]

## 2013-11-15 NOTE — Progress Notes (Signed)
[facility]  Patient Status: New pt for [facility] this admission  [facility] is providing the following services: HHRN and [facility] for home IV antibiotics. [facility] team will provide in hospital teaching regarding IV ABX for pt to be independent at home. [facility] team will support transition home when deemed appropriate by MD>   If patient discharges after hours, please call [phone].   Larry Sierras 11/15/2013, 3:36 PM

## 2013-11-15 NOTE — Progress Notes (Addendum)
[facility] PROGRESS NOTE  Travis Salinas PPJ:[id number] DOB: Mar 23, 1951 DOA: 11/11/2013 PCP: [name], MD  Assessment/Plan:   Active Problems:  Cellulitis  HTN (hypertension)  Depression   63 y.o. male, sent by his PCP for worsening right lower extremity edema, erythema, and pain, patient reports he had few episodes of silhouette is over the last 2 months, he has already took 2 courses of by mouth doxycycline in January, as well was in ED few days ago with diagnosis of the cellulitis with an abscess status post I&D in ED, discharge on by mouth clindamycin, patient had no improvement, so his PCP today with worsening swelling and tenderness is admitted with abscess   1. Right lower extremity cellulitis, abscess; [place]: Abscess right calf; leukocytosis  -failed outpatient atx;  -s/p I&D per surgery 3/6; wound cultures: GPC  and Gr "-" rods pend sensitivity;  -cont IV atx; pend sens; ID eval /? IV /PICC at discharge  2. Hypertension. Blood pressure is acceptable, continue with Norvasc  3. History of Depression: Continue with home meds  DVT prophylaxis; lovenox   Code Status: full Family Communication:  D/w patient, offered to d/w family he declined  (indicate person spoken with, relationship, and if by phone, the number) Disposition Plan: home per surgery    Consultants:  Surgery   Procedures:  I&D pend   Antibiotics:  vanc 3/5><<<<  unasyn 3/5>>>>>   (indicate start date, and stop date if known)  HPI/Subjective: alert  Objective: Filed Vitals:   11/15/13 0835  BP: 143/73  Pulse:   Temp:   Resp:     Intake/Output Summary (Last 24 hours) at 11/15/13 0938 Last data filed at 11/15/13 0558  Gross per 24 hour  Intake   1020 ml  Output    700 ml  Net    320 ml   Filed Weights   11/12/13 0118  Weight: 70.761 kg (156 lb)    Exam:   General:  alert  Cardiovascular: s1,s2 rrr  Respiratory: CTA BL  Abdomen: soft, nt, nd   Musculoskeletal:  no LE edema   Data Reviewed: Basic Metabolic Panel:  Recent Labs Lab 11/11/13 2050 11/12/13 0435 11/15/13 0346  NA 139 139 143  K 4.6 4.3 4.6  CL 100 101 106  CO2 26 24 25   GLUCOSE 97 110* 102*  BUN 22 22 9   CREATININE 1.03 1.07 1.00  CALCIUM 9.4 9.0 9.0   Liver Function Tests:  Recent Labs Lab 11/11/13 2050  AST 44*  ALT 27  ALKPHOS 160*  BILITOT 0.8  PROT 7.3  ALBUMIN 2.9*   No results found for this basename: LIPASE, AMYLASE,  in the last 168 hours No results found for this basename: AMMONIA,  in the last 168 hours CBC:  Recent Labs Lab 11/11/13 2050 11/12/13 0435 11/13/13 0422 11/13/13 1353 11/14/13 0538 11/15/13 0346  WBC 23.7* 23.6* 20.1* 18.6* 18.6* 12.8*  NEUTROABS 16.4*  --   --   --   --   --   HGB 10.6* 11.0* 9.9* 11.1* 12.0* 11.5*  HCT 34.0* 34.1* 32.6* 34.6* 38.1* 35.8*  MCV 82.9 82.8 83.8 82.8 83.2 82.3  PLT 474* 488* 487* 538* 422* 610*   Cardiac Enzymes: No results found for this basename: CKTOTAL, CKMB, CKMBINDEX, TROPONINI,  in the last 168 hours BNP (last 3 results) No results found for this basename: PROBNP,  in the last 8760 hours CBG: No results found for this basename: GLUCAP,  in the last 168 hours  Recent Results (from the past 240 hour(s))  CULTURE, ROUTINE-ABSCESS     Status: None   Collection Time    11/12/13 12:13 PM      Result Value Ref Range Status   Specimen Description ABSCESS RIGHT CALF   Final   Special Requests NONE   Final   Gram Stain     Final   Value: ABUNDANT WBC PRESENT, PREDOMINANTLY PMN     RARE SQUAMOUS EPITHELIAL CELLS PRESENT     ABUNDANT GRAM NEGATIVE RODS     ABUNDANT GRAM POSITIVE COCCI IN PAIRS     Performed at [facility]   Culture     Final   Value: Culture reincubated for better growth     Performed at [facility]   Report Status PENDING   Incomplete  ANAEROBIC CULTURE     Status: None   Collection Time    11/12/13 12:13 PM      Result Value Ref Range Status    Specimen Description ABSCESS RIGHT CALF   Final   Special Requests NONE   Final   Gram Stain     Final   Value: ABUNDANT WBC PRESENT, PREDOMINANTLY PMN     NO SQUAMOUS EPITHELIAL CELLS SEEN     ABUNDANT GRAM NEGATIVE RODS     ABUNDANT GRAM POSITIVE COCCI IN PAIRS     Performed at [facility]   Culture     Final   Value: NO ANAEROBES ISOLATED; CULTURE IN PROGRESS FOR 5 DAYS     Performed at [facility]   Report Status PENDING   Incomplete     Studies: No results found.  Scheduled Meds: . amLODipine  10 mg Oral q morning - 10a  . ampicillin-sulbactam (UNASYN) IV  1.5 g Intravenous 4 times per day  . ARIPiprazole  10 mg Oral q morning - 10a  . atenolol  25 mg Oral QHS  . docusate sodium  100 mg Oral BID  . enoxaparin (LOVENOX) injection  40 mg Subcutaneous Q24H  . FLUoxetine  10 mg Oral QHS  . lamoTRIgine  150 mg Oral QHS  . lamoTRIgine  50 mg Oral Daily  . senna  1 tablet Oral BID  . vancomycin  1,250 mg Intravenous Q12H   Continuous Infusions:    Principal Problem:   Abscess of calf, right Active Problems:   Cellulitis   HTN (hypertension)   Depression    Time spent: >35 minuets     Kinnie Feil  [facility] Pager [phone]. If 7PM-7AM, please contact night-coverage at [email or web address], password [facility] 11/15/2013, 9:38 AM  LOS: 4 days

## 2013-11-15 NOTE — Progress Notes (Signed)
ANTIBIOTIC CONSULT NOTE - FOLLOW UP  Pharmacy Consult for Vancomycin Indication: Cellulitis/abscess  Allergies  Allergen Reactions  . Ciprofloxacin Nausea Only  . Depakote [Divalproex Sodium] Other (See Comments)    hallucinations    Patient Measurements: Height: 6' (182.9 cm) Weight: 156 lb (70.761 kg) IBW/kg (Calculated) : 77.6 Adjusted Body Weight:   Vital Signs: Temp: 97.7 F (36.5 C) (03/09 0452) Temp src: Oral (03/09 0452) BP: 107/59 mmHg (03/09 0452) Pulse Rate: 53 (03/09 0452) Intake/Output from previous day: 03/08 0701 - 03/09 0700 In: 770 [P.O.:720; IV Piggyback:50] Out: 450 [Urine:450] Intake/Output from this shift: Total I/O In: 290 [P.O.:240; IV Piggyback:50] Out: -   Labs:  Recent Labs  11/13/13 1353 11/14/13 0538 11/15/13 0346  WBC 18.6* 18.6* 12.8*  HGB 11.1* 12.0* 11.5*  PLT 538* 422* 610*  CREATININE  --   --  1.00   Estimated Creatinine Clearance: 77.7 ml/min (by C-G formula based on Cr of 1).  Recent Labs  11/15/13 0346  VANCOTROUGH 14.6     Microbiology: Recent Results (from the past 720 hour(s))  CULTURE, ROUTINE-ABSCESS     Status: None   Collection Time    11/12/13 12:13 PM      Result Value Ref Range Status   Specimen Description ABSCESS RIGHT CALF   Final   Special Requests NONE   Final   Gram Stain     Final   Value: ABUNDANT WBC PRESENT, PREDOMINANTLY PMN     RARE SQUAMOUS EPITHELIAL CELLS PRESENT     ABUNDANT GRAM NEGATIVE RODS     ABUNDANT GRAM POSITIVE COCCI IN PAIRS     Performed at [facility]   Culture     Final   Value: Culture reincubated for better growth     Performed at [facility]   Report Status PENDING   Incomplete  ANAEROBIC CULTURE     Status: None   Collection Time    11/12/13 12:13 PM      Result Value Ref Range Status   Specimen Description ABSCESS RIGHT CALF   Final   Special Requests NONE   Final   Gram Stain     Final   Value: ABUNDANT WBC PRESENT, PREDOMINANTLY PMN   NO SQUAMOUS EPITHELIAL CELLS SEEN     ABUNDANT GRAM NEGATIVE RODS     ABUNDANT GRAM POSITIVE COCCI IN PAIRS     Performed at [facility]   Culture     Final   Value: NO ANAEROBES ISOLATED; CULTURE IN PROGRESS FOR 5 DAYS     Performed at [facility]   Report Status PENDING   Incomplete    Anti-infectives   Start     Dose/Rate Route Frequency Ordered Stop   11/15/13 1800  vancomycin (VANCOCIN) 1,250 mg in sodium chloride 0.9 % 250 mL IVPB     1,250 mg 166.7 mL/hr over 90 Minutes Intravenous Every 12 hours 11/15/13 0514     11/12/13 1800  vancomycin (VANCOCIN) IVPB 750 mg/150 ml premix  Status:  Discontinued     750 mg 150 mL/hr over 60 Minutes Intravenous Every 12 hours 11/12/13 0329 11/12/13 0856   11/12/13 1600  vancomycin (VANCOCIN) IVPB 1000 mg/200 mL premix     1,000 mg 200 mL/hr over 60 Minutes Intravenous Every 12 hours 11/12/13 0856     11/12/13 0045  ampicillin-sulbactam (UNASYN) 1.5 g in sodium chloride 0.9 % 50 mL IVPB     1.5 g 100 mL/hr over 30 Minutes Intravenous  4 times per day 11/12/13 0026     11/12/13 0045  vancomycin (VANCOCIN) IVPB 1000 mg/200 mL premix     1,000 mg 200 mL/hr over 60 Minutes Intravenous  Once 11/12/13 0032 11/12/13 0506   11/11/13 2015  cefTRIAXone (ROCEPHIN) 1 g in dextrose 5 % 50 mL IVPB  Status:  Discontinued     1 g 100 mL/hr over 30 Minutes Intravenous Every 24 hours 11/11/13 2003 11/12/13 0214      Assessment: Patient with low vancomycin level.  Goal of Therapy:  Vancomycin trough level 15-20 mcg/ml  Plan:  Measure antibiotic drug levels at steady state Follow up culture results Change vancomycin to 1250mg  iv q12hr  Tyler Deis, Shea Stakes [place] 11/15/2013,5:17 AM

## 2013-11-15 NOTE — Progress Notes (Signed)
Subjective: 3 Days Post-Op Procedure(s) (LRB): INCISION AND DRAINAGE AND OPEN PACKING OF RIGHT CALF  ABSCESS (Right) Patient reports pain as moderate.    Objective: Vital signs in last 24 hours: Temp:  [97.7 F (36.5 C)-97.9 F (36.6 C)] 97.7 F (36.5 C) (03/09 0452) Pulse Rate:  [53-77] 53 (03/09 0452) Resp:  [18] 18 (03/09 0452) BP: (107-143)/(59-73) 143/73 mmHg (03/09 0835) SpO2:  [96 %-97 %] 97 % (03/09 0452)  Intake/Output from previous day: 03/08 0701 - 03/09 0700 In: 1020 [P.O.:720; IV Piggyback:300] Out: 850 [Urine:850] Intake/Output this shift:     Recent Labs  11/13/13 0422 11/13/13 1353 11/14/13 0538 11/15/13 0346  HGB 9.9* 11.1* 12.0* 11.5*    Recent Labs  11/14/13 0538 11/15/13 0346  WBC 18.6* 12.8*  RBC 4.58 4.35  HCT 38.1* 35.8*  PLT 422* 610*    Recent Labs  11/15/13 0346  NA 143  K 4.6  CL 106  CO2 25  BUN 9  CREATININE 1.00  GLUCOSE 102*  CALCIUM 9.0   No results found for this basename: LABPT, INR,  in the last 72 hours  Sensation intact distally Intact pulses distally Compartment soft Incision benign moderate serosanguineous drainage  Assessment/Plan: 3 Days Post-Op Procedure(s) (LRB): INCISION AND DRAINAGE AND OPEN PACKING OF RIGHT CALF  ABSCESS (Right) Continue current care   Travis Salinas, [facility] 11/15/2013, 9:05 AM

## 2013-11-16 MED ORDER — AMOXICILLIN-POT CLAVULANATE 875-125 MG PO TABS: 1.0000 | ORAL_TABLET | Freq: Two times a day (BID) | ORAL | Status: DC

## 2013-11-16 NOTE — Progress Notes (Signed)
Came to visit patient on behalf of [employer] to [employer] program for [employer] employees/dependents with [employer]. States his wife is [employer]. States his HTN is well managed. Does not feel like he needs the Link to Wellness program for HTN. However, he is agreeable to a post hospital discharge call. Left contact information and brochure at bedside. Appreciative of visit.  [name], MSN, RN, BSN- [facility] [phone]

## 2013-11-16 NOTE — Progress Notes (Signed)
   CARE MANAGEMENT NOTE 11/16/2013  Patient:  Travis Salinas, Travis Salinas   Account Number:  [id number]  Date Initiated:  11/12/2013  Documentation initiated by:  [facility]  Subjective/Objective Assessment:   abscess on calf, cellulitis     Action/Plan:   going home to stay with wife, [name] [phone]   Anticipated DC Date:  11/17/2013   Anticipated DC Plan:  [place]  CM consult      [facility] Choice  HOME HEALTH   Choice offered to / List presented to:  C-1 Patient           Status of service:  Completed, signed off Medicare Important Message given?   (If response is "NO", the following Medicare IM given date fields will be blank) Date Medicare IM given:   Date Additional Medicare IM given:    Discharge Disposition:  HOME/SELF CARE  Per UR Regulation:    If discussed at Long Length of Stay Meetings, dates discussed:    Comments:  11/16/2013 1244 No HH needed. Pt will dc home on po abx. [facility] referral cancelled. Jonnie Finner RN CCM Case Mgmt [phone]  11/15/2013 1420 NCM spoke to pt and offered choice for [facility]. Pt agreeable to [facility] for HH. Waiting for final recommendation for HH and IV abx. Pt states he is going to his wife's home after dc. She will be able to assist with administering IV abx. Jonnie Finner RN CCM Case Mgmt phone [phone]

## 2013-11-16 NOTE — Discharge Summary (Signed)
Physician Discharge Summary  Travis Salinas Travis Salinas:[id number] DOB: [birth year] DOA: 11/11/2013  PCP: Travis Solo, MD  Admit date: 11/11/2013 Discharge date: 11/16/2013  Time spent: >35 minutes  Recommendations for Outpatient Follow-up:  F/u with orthopedics in 1 week s Discharge Diagnoses:  Principal Problem:   Abscess of calf, right Active Problems:   Cellulitis   HTN (hypertension)   Depression   Discharge Condition: stable   Diet recommendation: heart healthy   Filed Weights   11/12/13 0118  Weight: 70.761 kg (156 lb)    History of present illness:  Active Problems:  Cellulitis  HTN (hypertension)  Depression  63 y.o. male, sent by his PCP for worsening right lower extremity edema, erythema, and pain, patient reports he had few episodes of silhouette is over the last 2 months, he has already took 2 courses of by mouth doxycycline in January, as well was in ED few days ago with diagnosis of the cellulitis with an abscess status post I&D in ED, discharge on by mouth clindamycin, patient had no improvement, so his PCP today with worsening swelling and tenderness is admitted with abscess   Hospital Course:  1. Right lower extremity cellulitis, abscess; [place]: Abscess right calf; leukocytosis  -failed outpatient atx;  -s/p I&D per surgery 3/6; wound cultures: GPC and Gr "-" rods pend sensitivity;  -improved on IV atx; cultures d/w ID Dr. Megan Salon recommended to start Augmentin PO;   2. Hypertension. Blood pressure is acceptable, continue with Norvasc  3. History of Depression: Continue with home meds   Procedures:  I&D (i.e. Studies not automatically included, echos, thoracentesis, etc; not x-rays)  Consultations:  Ortho   Discharge Exam: Filed Vitals:   11/16/13 0934  BP: 146/68  Pulse:   Temp:   Resp:     General: alert Cardiovascular: s1,s2 rrr Respiratory: CTA BL  Discharge Instructions  Discharge Orders   Future Orders Complete By Expires   Diet -  low sodium heart healthy  As directed    Discharge instructions  As directed    Comments:     Please follow up with orthopedics in 1 week   Increase activity slowly  As directed        Medication List    STOP taking these medications       clindamycin 300 MG capsule  Commonly known as:  CLEOCIN      TAKE these medications       amLODipine 10 MG tablet  Commonly known as:  NORVASC  Take 10 mg by mouth every morning.     amoxicillin-clavulanate 875-125 MG per tablet  Commonly known as:  AUGMENTIN  Take 1 tablet by mouth 2 (two) times daily.     ARIPiprazole 10 MG tablet  Commonly known as:  ABILIFY  Take 10 mg by mouth every morning.     atenolol 25 MG tablet  Commonly known as:  TENORMIN  Take 25 mg by mouth at bedtime.     FLUoxetine 10 MG capsule  Commonly known as:  PROZAC  Take 10 mg by mouth at bedtime.     lamoTRIgine 100 MG tablet  Commonly known as:  LAMICTAL  Take 50-150 mg by mouth See admin instructions. 50mg  in AM, 150mg  in PM     naproxen sodium 220 MG tablet  Commonly known as:  ANAPROX  Take 440 mg by mouth as needed (headache/ pain).     QUEtiapine 50 MG tablet  Commonly known as:  SEROQUEL  Take 50 mg  by mouth at bedtime as needed (sleep).       Allergies  Allergen Reactions  . Ciprofloxacin Nausea Only  . Depakote [Divalproex Sodium] Other (See Comments)    hallucinations       Follow-up Information   Follow up with Salinas,Travis V, MD In 1 week.   Specialty:  Orthopedic Surgery   Contact information:   [place] [address] [phone]        The results of significant diagnostics from this hospitalization (including imaging, microbiology, ancillary and laboratory) are listed below for reference.    Significant Diagnostic Studies: [place] Extrem Low Right Ltd  11/12/2013   CLINICAL DATA:  Cellulitis.  Rule out abscess.  EXAM: ULTRASOUND right calf LOWER EXTREMITY LIMITED  TECHNIQUE: Ultrasound examination of  the lower extremity soft tissues was performed in the area of clinical concern.  COMPARISON:  None.  FINDINGS: Within the right calf adjacent to visible sores, there is a multiloculated complex fluid collection spanning over 11.3 x 4.7 x 6.8 cm. In the present clinical setting, this is suspicious for abscess. By ultrasound, other consideration would include hematoma or primary mass.  IMPRESSION: Abscess right calf suspected as detailed above.   Electronically Signed   By: Chauncey Cruel M.D.   On: 11/12/2013 01:50    Microbiology: Recent Results (from the past 240 hour(s))  CULTURE, ROUTINE-ABSCESS     Status: None   Collection Time    11/12/13 12:13 PM      Result Value Ref Range Status   Specimen Description ABSCESS RIGHT CALF   Final   Special Requests NONE   Final   Gram Stain     Final   Value: ABUNDANT WBC PRESENT, PREDOMINANTLY PMN     RARE SQUAMOUS EPITHELIAL CELLS PRESENT     ABUNDANT GRAM NEGATIVE RODS     ABUNDANT GRAM POSITIVE COCCI IN PAIRS     Performed at [facility]   Culture     Final   Value: MODERATE MICROAEROPHILIC STREPTOCOCCI     Note: Standardized susceptibility testing for this organism is not available.     Performed at [facility]   Report Status 11/15/2013 FINAL   Final  ANAEROBIC CULTURE     Status: None   Collection Time    11/12/13 12:13 PM      Result Value Ref Range Status   Specimen Description ABSCESS RIGHT CALF   Final   Special Requests NONE   Final   Gram Stain     Final   Value: ABUNDANT WBC PRESENT, PREDOMINANTLY PMN     NO SQUAMOUS EPITHELIAL CELLS SEEN     ABUNDANT GRAM NEGATIVE RODS     ABUNDANT GRAM POSITIVE COCCI IN PAIRS     Performed at [facility]   Culture     Final   Value: NO ANAEROBES ISOLATED; CULTURE IN PROGRESS FOR 5 DAYS     Performed at [facility]   Report Status PENDING   Incomplete     Labs: Basic Metabolic Panel:  Recent Labs Lab 11/11/13 2050 11/12/13 0435 11/15/13 0346  NA  139 139 143  K 4.6 4.3 4.6  CL 100 101 106  CO2 26 24 25   GLUCOSE 97 110* 102*  BUN 22 22 9   CREATININE 1.03 1.07 1.00  CALCIUM 9.4 9.0 9.0   Liver Function Tests:  Recent Labs Lab 11/11/13 2050  AST 44*  ALT 27  ALKPHOS 160*  BILITOT 0.8  PROT 7.3  ALBUMIN 2.9*   No results found for this basename: LIPASE, AMYLASE,  in the last 168 hours No results found for this basename: AMMONIA,  in the last 168 hours CBC:  Recent Labs Lab 11/11/13 2050 11/12/13 0435 11/13/13 0422 11/13/13 1353 11/14/13 0538 11/15/13 0346  WBC 23.7* 23.6* 20.1* 18.6* 18.6* 12.8*  NEUTROABS 16.4*  --   --   --   --   --   HGB 10.6* 11.0* 9.9* 11.1* 12.0* 11.5*  HCT 34.0* 34.1* 32.6* 34.6* 38.1* 35.8*  MCV 82.9 82.8 83.8 82.8 83.2 82.3  PLT 474* 488* 487* 538* 422* 610*   Cardiac Enzymes: No results found for this basename: CKTOTAL, CKMB, CKMBINDEX, TROPONINI,  in the last 168 hours BNP: BNP (last 3 results) No results found for this basename: PROBNP,  in the last 8760 hours CBG: No results found for this basename: GLUCAP,  in the last 168 hours     Signed:  Kinnie Feil  [facility] 11/16/2013, 11:51 AM

## 2013-11-17 LAB — ANAEROBIC CULTURE

## 2013-11-18 NOTE — ED Provider Notes (Signed)
Medical screening examination/treatment/procedure(s) were performed by non-physician practitioner and as supervising physician I was immediately available for consultation/collaboration.   EKG Interpretation None        Quamir Willemsen, MD 11/18/13 1052 

## 2014-08-07 ENCOUNTER — Encounter: Payer: Self-pay | Admitting: *Deleted

## 2014-09-08 ENCOUNTER — Other Ambulatory Visit (HOSPITAL_COMMUNITY): Payer: Self-pay | Admitting: Sports Medicine

## 2014-09-08 DIAGNOSIS — M25561 Pain in right knee: Secondary | ICD-10-CM

## 2014-09-08 DIAGNOSIS — R29898 Other symptoms and signs involving the musculoskeletal system: Secondary | ICD-10-CM

## 2014-09-22 ENCOUNTER — Ambulatory Visit (HOSPITAL_COMMUNITY)
Admission: RE | Admit: 2014-09-22 | Discharge: 2014-09-22 | Disposition: A | Discharge status: Home / Self Care | Payer: 59 | Source: Ambulatory Visit | Attending: Sports Medicine | Admitting: Sports Medicine

## 2014-09-22 DIAGNOSIS — M778 Other enthesopathies, not elsewhere classified: Secondary | ICD-10-CM | POA: Diagnosis not present

## 2014-09-22 DIAGNOSIS — M25561 Pain in right knee: Secondary | ICD-10-CM | POA: Insufficient documentation

## 2014-09-22 DIAGNOSIS — R29898 Other symptoms and signs involving the musculoskeletal system: Secondary | ICD-10-CM

## 2015-02-16 ENCOUNTER — Other Ambulatory Visit (HOSPITAL_COMMUNITY): Payer: Self-pay | Admitting: Gastroenterology

## 2015-02-16 DIAGNOSIS — B1920 Unspecified viral hepatitis C without hepatic coma: Secondary | ICD-10-CM

## 2015-03-08 ENCOUNTER — Ambulatory Visit (HOSPITAL_COMMUNITY): Payer: 59

## 2015-03-09 ENCOUNTER — Other Ambulatory Visit (HOSPITAL_COMMUNITY): Payer: 59

## 2015-03-15 ENCOUNTER — Ambulatory Visit (HOSPITAL_COMMUNITY): Payer: 59

## 2015-03-17 ENCOUNTER — Ambulatory Visit (HOSPITAL_COMMUNITY): Payer: 59

## 2015-03-22 ENCOUNTER — Ambulatory Visit (HOSPITAL_COMMUNITY)
Admission: RE | Admit: 2015-03-22 | Discharge: 2015-03-22 | Disposition: A | Discharge status: Home / Self Care | Payer: 59 | Source: Ambulatory Visit | Attending: Gastroenterology | Admitting: Gastroenterology

## 2015-03-22 DIAGNOSIS — B1920 Unspecified viral hepatitis C without hepatic coma: Secondary | ICD-10-CM | POA: Diagnosis present

## 2015-04-19 ENCOUNTER — Other Ambulatory Visit: Payer: Self-pay | Admitting: Internal Medicine

## 2015-04-19 ENCOUNTER — Ambulatory Visit (INDEPENDENT_AMBULATORY_CARE_PROVIDER_SITE_OTHER): Payer: 59 | Admitting: Internal Medicine

## 2015-04-19 ENCOUNTER — Encounter: Payer: Self-pay | Admitting: Internal Medicine

## 2015-04-19 VITALS — BP 127/77 | HR 66 | Temp 97.7°F | Ht 71.0 in | Wt 146.0 lb

## 2015-04-19 DIAGNOSIS — F1910 Other psychoactive substance abuse, uncomplicated: Secondary | ICD-10-CM | POA: Insufficient documentation

## 2015-04-19 DIAGNOSIS — B182 Chronic viral hepatitis C: Secondary | ICD-10-CM | POA: Diagnosis not present

## 2015-04-19 DIAGNOSIS — G8929 Other chronic pain: Secondary | ICD-10-CM

## 2015-04-19 DIAGNOSIS — M542 Cervicalgia: Secondary | ICD-10-CM

## 2015-04-19 NOTE — Progress Notes (Signed)
HPI:  +[place] [address] [name] is a 64 y.o. male who presents for initial evaluation and management of chronic hepatitis C.  Patient tested positive diagnosed last year. Hepatitis C risk factors present are: IV drug abuse (details: last used 1-2 months ago). Patient denies renal dialysis, sexual contact with person with liver disease, tattoos. Patient has had other studies performed. Results: hepatitis C RNA by PCR, result: positive. Patient has not had prior treatment for Hepatitis C. Patient does not have a past history of liver disease. Patient does not have a family history of liver disease.  He has a past history of daily alcohol use but stopped several years ago though has been using regular heroin up until 2 months ago.      Patient does have documented immunity to Hepatitis A. Patient does have documented immunity to Hepatitis B.    Review of Systems:  Constitutional: Negative for fatigue, weight loss.  HENT: Negative for hearing loss, ear pain, neck pain, tinnitus and ear discharge.  Eyes: Negative for icterus, discharge and redness.  Respiratory: Negative fordypsnea, wheezing.  Cardiovascular: Negative for chest pain, palpitations, orthopnea, claudication and leg swelling.  Gastrointestinal: Negative for nausea, vomiting, abdominal distention and abdominal pain.Negative for  Genitourinary: Negative for dysuria, urgency, frequency, hematuria and flank pain.  Musculoskeletal: Negative for arthralgias, arthritis Skin: Negative for itching and rash. Neurological: Negative for dizziness and weakness. Endo/Heme/Allergies: Negative for environmental allergies and polydipsia. Does not bruise/bleed easily.      Past Medical History  Diagnosis Date  . Hypertension     Prior to Admission medications   Medication Sig Start Date End Date Taking? Authorizing Provider  amLODipine (NORVASC) 10 MG tablet Take 10 mg by mouth every morning.   Yes Historical Provider, MD  ARIPiprazole (ABILIFY)  10 MG tablet Take 10 mg by mouth every morning.   Yes Historical Provider, MD  atenolol (TENORMIN) 25 MG tablet Take 25 mg by mouth at bedtime.   Yes Historical Provider, MD  carisoprodol (SOMA) 250 MG tablet Take 350 mg by mouth 2 (two) times daily as needed.   Yes Historical Provider, MD  clonazePAM (KLONOPIN) 1 MG tablet Take 1 mg by mouth 2 (two) times daily.   Yes Historical Provider, MD  FLUoxetine (PROZAC) 10 MG capsule Take 10 mg by mouth at bedtime.    Yes Historical Provider, MD  lamoTRIgine (LAMICTAL) 100 MG tablet Take 50-150 mg by mouth See admin instructions. 50mg  in AM, 150mg  in PM   Yes Historical Provider, MD  naproxen sodium (ANAPROX) 220 MG tablet Take 440 mg by mouth as needed (headache/ pain).   Yes Historical Provider, MD    Allergies  Allergen Reactions  . Ciprofloxacin Nausea Only  . Depakote [Divalproex Sodium] Other (See Comments)    hallucinations    Social History  Substance Use Topics  . Smoking status: Former Smoker -- 0.50 packs/day for 25 years    Types: Cigarettes    Quit date: 09/09/2000  . Smokeless tobacco: Never Used  . Alcohol Use: No    FH: no liver disease, no liver cancer   Objective:   Filed Vitals:   04/19/15 1410  BP: 127/77  Pulse: 66  Temp: 97.7 F (36.5 C)   GEN: in no apparent distress and alert HEENT: anicteric Cardiac: Cor RRR and No murmurs Lungs: clear Abdomen: Bowel sounds are normal, liver is not enlarged, spleen is not enlarged Ext: peripheral pulses normal, no pedal edema, no clubbing or cyanosis Skin: negative for -  jaundice, spider hemangioma, telangiectasia, palmar erythema, ecchymosis and atrophy Musculoskeletal: no joint swelling  Laboratory Genotype: No results found for: HCVGENOTYPE HCV viral load: No results found for: HCVQUANT Lab Results  Component Value Date   WBC 12.8* 11/15/2013   HGB 11.5* 11/15/2013   HCT 35.8* 11/15/2013   MCV 82.3 11/15/2013   PLT 610* 11/15/2013    Lab Results  Component  Value Date   CREATININE 1.00 11/15/2013   BUN 9 11/15/2013   NA 143 11/15/2013   K 4.6 11/15/2013   CL 106 11/15/2013   CO2 25 11/15/2013    Lab Results  Component Value Date   ALT 27 11/11/2013   AST 44* 11/11/2013   ALKPHOS 160* 11/11/2013   BILITOT 0.8 11/11/2013      Assessment: Chronic Hepatitis C genotype 1a I discussed with the patient the natural history and progression of chronic hepatitis C infection including about 30% of people who develop cirrhosis of the liver and once cirrhosis is established there is a 2-7% risk per year of liver cancer and liver failure.    Plan: 1) Patient counseled extensively on limiting acetaminophen to no more than 2 grams daily, avoidance of alcohol. 2) Transmission discussed with patient including sexual transmission, sharing razors and toothbrush.   3) Will need referral to gastroenterology if concern for cirrhosis 4) Will need referral for substance abuse counseling: Yes.   5) Will prescribe Harvoni, Rozann Lesches or Zepatier for 12 weeks once work up complete 6) Hepatitis A vaccine No. 7) Hepatitis B vaccine No.

## 2015-04-19 NOTE — Patient Instructions (Signed)
Date 04/19/2015  Dear Mr Nygel Prokop, As discussed in the [facility], your hepatitis C therapy will include the following medications:          Harvoni 90mg /400mg  tablet:           Take 1 tablet by mouth once daily   Please note that ALL MEDICATIONS WILL START ON THE SAME DATE for a total of 12 weeks. ---------------------------------------------------------------- Your HCV Treatment Start Date: TBA   Your HCV genotype:  1a    Liver Fibrosis: F2/3    ---------------------------------------------------------------- YOUR PHARMACY CONTACT:   [place] [facility] [place] Phone: [phone] Hours: Monday to Friday 7:30 am to 6:00 pm   Please always contact your pharmacy at least 3-4 business days before you run out of medications to ensure your next month's medication is ready or 1 week prior to running out if you receive it by mail.  Remember, each prescription is for 28 days. ---------------------------------------------------------------- GENERAL NOTES REGARDING YOUR HEPATITIS C MEDICATION:  SOFOSBUVIR/LEDIPASVIR (HARVONI): - Harvoni tablet is taken daily with OR without food. - The tablets are orange. - The tablets should be stored at room temperature.  - Acid reducing agents such as H2 blockers (ie. Pepcid (famotidine), Zantac (ranitidine), Tagamet (cimetidine), Axid (nizatidine) and proton pump inhibitors (ie. Prilosec (omeprazole), Protonix (pantoprazole), Nexium (esomeprazole), or Aciphex (rabeprazole)) can decrease effectiveness of Harvoni. Do not take until you have discussed with a health care provider.    -Antacids that contain magnesium and/or aluminum hydroxide (ie. Milk of Magensia, Rolaids, Gaviscon, Maalox, Mylanta, an dArthritis Pain Formula)can reduce absorption of Harvoni, so take them at least 4 hours before or after Harvoni.  -Calcium carbonate (calcium supplements or antacids such as Tums, Caltrate,  Os-Cal)needs to be taken at least 4 hours hours before or after Harvoni.  -St. John's wort or any products that contain St. John's wort like some herbal supplements  Please inform the office prior to starting any of these medications.  - The common side effects with Harvoni:      1. Fatigue      2. Headache      3. Nausea      4. Diarrhea      5. Insomnia   Support Path is a suite of resources designed to help patients start with HARVONI and move toward treatment completion [place] helps patients access therapy and get off to an efficient start  Benefits investigation and prior authorization support Co-pay and other financial assistance A specialty pharmacy finder CO-PAY COUPON The [place] co-pay coupon may help eligible patients lower their out-of-pocket costs. With a co-pay coupon, most eligible patients may pay no more than $5 per co-pay (restrictions apply) www.harvoni.com call 959-560-1927 Not valid for patients enrolled in government healthcare prescription drug programs, such as Medicare Part D and Medicaid. Patients in the coverage gap known as the "donut hole" also are not eligible The HARVONI co-pay coupon program will cover the out-of-pocket costs for HARVONI prescriptions up to a maximum of 25% of the catalog price of a 12-week regimen of HARVONI  Please note that this only lists the most common side effects and is NOT a comprehensive list of the potential side effects of these medications. For more information, please review the drug information sheets that come with your medication package from the pharmacy.  ---------------------------------------------------------------- GENERAL HELPFUL HINTS ON HCV THERAPY: 1. No alcohol. 2. Protect against sun-sensitivity/sunburns (wear sunglasses, hat, long sleeves,  pants and sunscreen). 3. Stay well-hydrated/well-moisturized. 4. Notify the [facility] of any changes in your other over-the-counter/herbal or  prescription medications. 5. If you miss a dose of your medication, take the missed dose as soon as you remember. Return to your regular time/dose schedule the next day.  6.  Do not stop taking your medications without first talking with your healthcare provider. 7.  You may take Tylenol (acetaminophen), as long as the dose is less than 2000 mg (OR no more than 4 tablets of the Tylenol Extra Strengths 500mg  tablet) in 24 hours. 8.  You will need to obtain routine labs and/or office visits at [facility] at weeks 4 and 12 as well as 12 and 24 weeks after completion of treatment.   Scharlene Gloss, [place] for [place] [address] [place], [place]  [id number] [phone]

## 2015-04-19 NOTE — Addendum Note (Signed)
Addended by: Thayer Headings on: 04/19/2015 05:36 PM   Modules accepted: Medications

## 2015-04-20 ENCOUNTER — Encounter: Payer: Self-pay | Admitting: Gastroenterology

## 2015-04-21 MED ORDER — LEDIPASVIR-SOFOSBUVIR 90-400 MG PO TABS: 1.0000 | ORAL_TABLET | Freq: Every day | ORAL | Status: DC

## 2015-04-21 NOTE — Addendum Note (Signed)
Addended by: Thayer Headings on: 04/21/2015 12:58 PM   Modules accepted: Orders

## 2015-04-23 LAB — OPIATES/OPIOIDS (LC/MS-MS)
Codeine Urine: NEGATIVE ng/mL (ref <50)
HYDROCODONE: NEGATIVE ng/mL (ref <50)
HYDROMORPHONE: 65 ng/mL — AB (ref <50)
MORPHINE: 1768 ng/mL — AB (ref <50)
NORHYDROCODONE, UR: NEGATIVE ng/mL (ref <50)
NOROXYCODONE, UR: 582 ng/mL — AB (ref <50)
OXYMORPHONE, URINE: NEGATIVE ng/mL (ref <50)
Oxycodone, ur: NEGATIVE ng/mL (ref <50)

## 2015-04-27 ENCOUNTER — Ambulatory Visit: Payer: 59 | Admitting: *Deleted

## 2015-05-03 ENCOUNTER — Encounter (HOSPITAL_COMMUNITY): Payer: Self-pay | Admitting: Pharmacy Technician

## 2015-05-03 ENCOUNTER — Ambulatory Visit: Payer: 59 | Admitting: *Deleted

## 2015-05-03 DIAGNOSIS — F1110 Opioid abuse, uncomplicated: Secondary | ICD-10-CM

## 2015-05-04 NOTE — BH Specialist Note (Signed)
Travis Salinas was present for his scheduled appointment today.  Client oriented times four with good affect and dress.  Client was a it sluggish but was cognitively present.  Client shared a story about the history of his life.  Client stated that he had an ongoing problem with heroin abuse.  Client stated that he used heroin when he was in high school and then stopped a number of years. Client states that he started using again at age 64.  Client reports that he last used about a month ago.  Client indicates that he use to drink a lot but stopped about 14 years ago.  Client admitted he was diagnosed with Hep C and is ready to get his life on track because he does not want to die prematurely.  Client has no children and is presently separated.  Client says that despite the fact that he and his wife are separated, they talk on the phone almost every day. Client shared that he had substance abuse treatment at [facility] back in 2001 but it did not do a whole lot for him he said.  Client has been active over the years enjoying such activities as: golfing, deep sea fishing, and bowling.  Client said that he cannot do some of these activities now because of his bum knee.  Counselor encouraged client to pursue other activities that he could do to keep himself busy with productive things to avoid relapsing as client reports that boredom is his main trigger for using substances. Client agreed that he tends to hang out with wrong crowd when he is bored.  Counselor educated client about the fact that recovery starts and ends with a life style change.  Counselor explained that will power alone will not beat addiction but being smart will.  Counselor suggested to client to meet regularly to work on recovery.  Client agreed and made another appointment for two weeks out.   Travis Salinas, LPCA, MA [facility]

## 2015-05-12 ENCOUNTER — Telehealth: Payer: Self-pay | Admitting: Lab

## 2015-05-12 NOTE — Telephone Encounter (Signed)
LM for pt to call for appmts- needs appt after med pick up, 05/03/15, 6wks from this date to see Dr Linus Salmons and 4 wks after this date to have labs done

## 2015-05-16 ENCOUNTER — Ambulatory Visit: Payer: 59 | Admitting: *Deleted

## 2015-05-16 DIAGNOSIS — F1110 Opioid abuse, uncomplicated: Secondary | ICD-10-CM

## 2015-05-16 DIAGNOSIS — F32A Depression, unspecified: Secondary | ICD-10-CM

## 2015-05-16 DIAGNOSIS — F329 Major depressive disorder, single episode, unspecified: Secondary | ICD-10-CM

## 2015-05-16 NOTE — Telephone Encounter (Signed)
Pt came in today to see Leveda Anna the counselor, wife stated that all follow ups for meds recd on 08/24 from Dr Linus Salmons will be done by GI DR-Dr Paulita Fujita.  Their insurance required that they see a specialist first but initially the pt was referred to GI Dr-Dr Paulita Fujita.  Informed Inez Catalina in the pharmacy.

## 2015-05-17 NOTE — BH Specialist Note (Signed)
Travis Salinas was present today for his scheduled appointment with counselor.  Client was oriented times four with good affect and dress.  Client was moving slow and appeared sluggish.  Client did not appear in the best of spirits as evidenced by not smiling much, talking negatively, and not having a good attitude or outlook.  Client shared that he has not used any substances since the last session together.  Counselor is doubtful of this report by client.  Client says that he has been in pain because of his neck and his leg and has not felt like doing much of anything.  Client said that he feels like his body has been breaking down a lot lately and he has not been feeling well. Client shared that he has spent some time with a lady friend, going to movies, eating out, kayaking etc.  Client says that they were seeing each other pretty regularly but over the last week or two the phone calls have all but stopped from her.  Client showed today with his ex wife as if nothing was wrong.  Client asked her opinion about when he could make his next appointment.  Client is apparently still very involved with his ex wife despite that they do not live together and sole their mutual home.  Counselor educated client on distraction and falling into a rut by staying at home constantly, isolating one's self, and simply detaching from activities and people outside his home.  Client said he would do a better job of involving himself in activities that would get him out of the home and help with his boredom.  Client made another appointment with counselor in two weeks.   Rolena Infante, LPCA, MA [facility]

## 2015-05-31 ENCOUNTER — Ambulatory Visit: Payer: 59 | Admitting: *Deleted

## 2015-05-31 DIAGNOSIS — F1010 Alcohol abuse, uncomplicated: Secondary | ICD-10-CM

## 2015-06-01 NOTE — BH Specialist Note (Signed)
Travis Salinas was present for his scheduled appointment today.  Client was oriented times four with flat affect but talkative with counselor. Client shared that he was frustrated still about the charge he received for selling Xanax to a friend.  Client said that he is wanting to get his life back in order and was anxious for everything to fall back into place.  Client admitted that he was feeling bored and lonely but also indicated to counselor that he brought it on himself by doing drugs and loosing his wife and home as a result. Counselor asked client if he had done his homework which was to participate in three recreational activities during the two period between appointments.  Client shared that he had gone fishing, played pool and bowling twice.  Client said that it did feel good to get out and do something outside his home.  Counselor reminded client that the weather is going to be changing soon and to make use of being able to be outdoors while he still could. Client agreed.  Counselor talked with client about emotional coping strategies involving rationalization and seeing self as being ok.  Client has a tendency to brag or boast on his past accomplishments and looks to counselor.  It would appear that client is over compensating for the place he finds himself in now with no wife, no home, and poor health....all as client indicates, a result of his long drug abuse history. Client communicated that he wanted to be in a better place health wise as he was traveling to [place] in November to visit his brother. Counselor encouraged client to continue not using any substances other than what was prescribed and to take his medications accordingly. Client denies any substance use at this time.  Another appointment with counselor was made for 3 weeks out.   Rolena Infante, LPCA, MA [facility]

## 2015-06-15 ENCOUNTER — Ambulatory Visit: Payer: 59 | Admitting: *Deleted

## 2015-06-15 DIAGNOSIS — F1110 Opioid abuse, uncomplicated: Secondary | ICD-10-CM

## 2015-06-20 NOTE — BH Specialist Note (Signed)
Travis Salinas was present for his scheduled appointment today.  Client was oriented times four with good affect and dress.  Client was alert and talkative.  Client denied any substance use at this time. Client shared that he was doing ok still just a little bored and didn't have much to do for fun.  Client says that this boredom often leads to using.  Counselor questioned clients boredom for possible depression as client continues to make much effort towards filling his day with recreational pursuits to avoid getting bored to begin with and possibly using.  Counselor educated client on scheduling pleasurable experiences.  Counselor worked with client on restructuring his thoughts as they may be altering his mood with thus alters behavior.  Client said that he does feel like he is in a rut and blames his aching body for not being motivated to go and do things. Counselor encouraged client and provided support accordingly.  Counselor talked with client about automatic thinking which typically is negative.  Client was attentive and agreed that counselor was hitting the nail on the head.  Counselor continued to share with client that scheduling activities and adhering to the schedule was very important to improve his boredom and lack of motivation.  Client agreed. Counselor assisted client making another appointment for two weeks out.   Rolena Infante, LPCA,MA [facility]

## 2015-06-29 ENCOUNTER — Ambulatory Visit: Payer: 59 | Admitting: *Deleted

## 2015-06-29 DIAGNOSIS — F1110 Opioid abuse, uncomplicated: Secondary | ICD-10-CM

## 2015-07-04 NOTE — BH Specialist Note (Signed)
Travis Salinas was present for his scheduled appointment today.  Client was in good spirits and shared that he was feeling pretty good.  Client stated that he had been getting more and more active and staying busy.  Client denied any substance abuse since last session together. Client shared that he had been going fishing, went on a trip to the [place], and doing some walking at his apartment complex.  Client says that he feels like getting out more lately has boosted his morale. Counselor complimented client for making effort to distract himself as opposed to using.  Client thanked counselor for encouraging him to be productive with his time and not stay home all day focusing on negative things.  Client shared that he feels like he is finally seeing above water.  Counselor encouraged client to continue this direction and make the most of his life while he physically still could.  Client was receptive and engaged in topic discussion appropriately. Client made another appointment to check in with counselor in another month.   Rolena Infante, LPCA, MA [facility]

## 2015-08-28 ENCOUNTER — Other Ambulatory Visit: Payer: Self-pay | Admitting: Orthopedic Surgery

## 2015-08-28 DIAGNOSIS — M542 Cervicalgia: Secondary | ICD-10-CM

## 2015-09-06 ENCOUNTER — Ambulatory Visit (HOSPITAL_COMMUNITY): Admission: RE | Admit: 2015-09-06 | Payer: 59 | Source: Ambulatory Visit

## 2015-09-19 DIAGNOSIS — F3341 Major depressive disorder, recurrent, in partial remission: Secondary | ICD-10-CM | POA: Diagnosis not present

## 2015-09-19 DIAGNOSIS — F341 Dysthymic disorder: Secondary | ICD-10-CM | POA: Diagnosis not present

## 2015-09-19 DIAGNOSIS — F411 Generalized anxiety disorder: Secondary | ICD-10-CM | POA: Diagnosis not present

## 2015-09-26 DIAGNOSIS — S83241A Other tear of medial meniscus, current injury, right knee, initial encounter: Secondary | ICD-10-CM | POA: Diagnosis not present

## 2015-10-02 DIAGNOSIS — Z23 Encounter for immunization: Secondary | ICD-10-CM | POA: Diagnosis not present

## 2015-10-25 ENCOUNTER — Ambulatory Visit: Payer: 59 | Admitting: *Deleted

## 2015-10-30 DIAGNOSIS — B1920 Unspecified viral hepatitis C without hepatic coma: Secondary | ICD-10-CM | POA: Diagnosis not present

## 2015-11-01 ENCOUNTER — Ambulatory Visit: Payer: 59 | Admitting: *Deleted

## 2015-11-01 DIAGNOSIS — F1410 Cocaine abuse, uncomplicated: Secondary | ICD-10-CM

## 2015-11-16 DIAGNOSIS — M47812 Spondylosis without myelopathy or radiculopathy, cervical region: Secondary | ICD-10-CM | POA: Diagnosis not present

## 2015-11-17 DIAGNOSIS — Z0000 Encounter for general adult medical examination without abnormal findings: Secondary | ICD-10-CM | POA: Diagnosis not present

## 2015-11-17 DIAGNOSIS — R3912 Poor urinary stream: Secondary | ICD-10-CM | POA: Diagnosis not present

## 2015-11-17 DIAGNOSIS — N401 Enlarged prostate with lower urinary tract symptoms: Secondary | ICD-10-CM | POA: Diagnosis not present

## 2015-11-27 ENCOUNTER — Ambulatory Visit: Payer: 59 | Admitting: *Deleted

## 2015-11-28 DIAGNOSIS — F1320 Sedative, hypnotic or anxiolytic dependence, uncomplicated: Secondary | ICD-10-CM | POA: Diagnosis not present

## 2015-11-28 DIAGNOSIS — F1420 Cocaine dependence, uncomplicated: Secondary | ICD-10-CM | POA: Diagnosis not present

## 2015-11-28 DIAGNOSIS — F1120 Opioid dependence, uncomplicated: Secondary | ICD-10-CM | POA: Diagnosis not present

## 2015-11-29 DIAGNOSIS — F1320 Sedative, hypnotic or anxiolytic dependence, uncomplicated: Secondary | ICD-10-CM | POA: Diagnosis not present

## 2015-11-29 DIAGNOSIS — F1120 Opioid dependence, uncomplicated: Secondary | ICD-10-CM | POA: Diagnosis not present

## 2015-11-29 DIAGNOSIS — F1420 Cocaine dependence, uncomplicated: Secondary | ICD-10-CM | POA: Diagnosis not present

## 2015-11-30 ENCOUNTER — Ambulatory Visit: Payer: 59 | Admitting: *Deleted

## 2015-11-30 DIAGNOSIS — F1120 Opioid dependence, uncomplicated: Secondary | ICD-10-CM | POA: Diagnosis not present

## 2015-11-30 DIAGNOSIS — F1420 Cocaine dependence, uncomplicated: Secondary | ICD-10-CM | POA: Diagnosis not present

## 2015-11-30 DIAGNOSIS — F1320 Sedative, hypnotic or anxiolytic dependence, uncomplicated: Secondary | ICD-10-CM | POA: Diagnosis not present

## 2015-12-01 DIAGNOSIS — F1120 Opioid dependence, uncomplicated: Secondary | ICD-10-CM | POA: Diagnosis not present

## 2015-12-01 DIAGNOSIS — F1320 Sedative, hypnotic or anxiolytic dependence, uncomplicated: Secondary | ICD-10-CM | POA: Diagnosis not present

## 2015-12-01 DIAGNOSIS — F1420 Cocaine dependence, uncomplicated: Secondary | ICD-10-CM | POA: Diagnosis not present

## 2015-12-02 ENCOUNTER — Ambulatory Visit (HOSPITAL_BASED_OUTPATIENT_CLINIC_OR_DEPARTMENT_OTHER): Payer: 59

## 2015-12-02 DIAGNOSIS — F1420 Cocaine dependence, uncomplicated: Secondary | ICD-10-CM | POA: Diagnosis not present

## 2015-12-02 DIAGNOSIS — R5383 Other fatigue: Secondary | ICD-10-CM | POA: Diagnosis not present

## 2015-12-02 DIAGNOSIS — F1320 Sedative, hypnotic or anxiolytic dependence, uncomplicated: Secondary | ICD-10-CM | POA: Diagnosis not present

## 2015-12-02 DIAGNOSIS — I10 Essential (primary) hypertension: Secondary | ICD-10-CM | POA: Diagnosis not present

## 2015-12-02 DIAGNOSIS — Z87891 Personal history of nicotine dependence: Secondary | ICD-10-CM | POA: Diagnosis not present

## 2015-12-02 DIAGNOSIS — F1120 Opioid dependence, uncomplicated: Secondary | ICD-10-CM | POA: Diagnosis not present

## 2015-12-02 DIAGNOSIS — R52 Pain, unspecified: Secondary | ICD-10-CM | POA: Diagnosis not present

## 2015-12-02 DIAGNOSIS — Z79899 Other long term (current) drug therapy: Secondary | ICD-10-CM | POA: Diagnosis not present

## 2015-12-05 ENCOUNTER — Ambulatory Visit (HOSPITAL_COMMUNITY): Payer: 59

## 2015-12-07 ENCOUNTER — Emergency Department (HOSPITAL_COMMUNITY)
Admission: EM | Admit: 2015-12-07 | Discharge: 2015-12-07 | Disposition: A | Discharge status: Home / Self Care | Payer: 59 | Attending: Emergency Medicine | Admitting: Emergency Medicine

## 2015-12-07 DIAGNOSIS — Z87891 Personal history of nicotine dependence: Secondary | ICD-10-CM | POA: Diagnosis not present

## 2015-12-07 DIAGNOSIS — I10 Essential (primary) hypertension: Secondary | ICD-10-CM | POA: Insufficient documentation

## 2015-12-07 DIAGNOSIS — R52 Pain, unspecified: Secondary | ICD-10-CM | POA: Insufficient documentation

## 2015-12-07 DIAGNOSIS — Z79899 Other long term (current) drug therapy: Secondary | ICD-10-CM | POA: Insufficient documentation

## 2015-12-07 DIAGNOSIS — R5383 Other fatigue: Secondary | ICD-10-CM | POA: Diagnosis not present

## 2015-12-07 LAB — I-STAT CHEM 8, ED
BUN: 19 mg/dL (ref 6–20)
CALCIUM ION: 1.18 mmol/L (ref 1.13–1.30)
CREATININE: 0.9 mg/dL (ref 0.61–1.24)
Chloride: 103 mmol/L (ref 101–111)
GLUCOSE: 98 mg/dL (ref 65–99)
HCT: 47 % (ref 39.0–52.0)
HEMOGLOBIN: 16 g/dL (ref 13.0–17.0)
Potassium: 3.7 mmol/L (ref 3.5–5.1)
Sodium: 141 mmol/L (ref 135–145)
TCO2: 24 mmol/L (ref 0–100)

## 2015-12-07 MED ORDER — SODIUM CHLORIDE 0.9 % IV BOLUS (SEPSIS)
1000.0000 mL | Freq: Once | INTRAVENOUS | Status: AC
  Administered 2015-12-07: 1000 mL via INTRAVENOUS

## 2015-12-07 NOTE — ED Notes (Signed)
Pt reports fatigue and body aches, reports is detox from drugs, denies alcohol intake. Also sts feels dehydrated. denies coughing or flu like symptoms. Alert and oriented x 4.

## 2015-12-07 NOTE — ED Provider Notes (Signed)
CSN: [id number]     Arrival date & time 12/07/15  1035 History   First MD Initiated Contact with Patient 12/07/15 1302     Chief Complaint  Patient presents with  . Fatigue  . Generalized Body Aches     (Consider location/radiation/quality/duration/timing/severity/associated sxs/prior Treatment) HPI Travis Salinas is a 65 y.o. male with history of hypertension, substance abuse, presents here from [facility] substance abuse facility for evaluation of fatigue and generalized body aches. Patient reports he is at [facility] for heroin detox, last use 12 days ago. He reports since that time he has had intermittent body aches, fatigue. He reports he was sent to the emergency room for evaluation of possible dehydration. He denies any headache, vision changes, chest pain or shortness of breath, numbness or weakness, dark or bloody stool. Denies any other illicit drug use or alcohol use in that time. No other medical complaints.  Past Medical History  Diagnosis Date  . Hypertension    Past Surgical History  Procedure Laterality Date  . Incision and drainage abscess Right 11/12/2013    Procedure: INCISION AND DRAINAGE AND OPEN PACKING OF RIGHT CALF  ABSCESS;  Surgeon: Earnstine Regal, MD;  Location: [facility];  Service: General;  Laterality: Right;   No family history on file. Social History  Substance Use Topics  . Smoking status: Former Smoker -- 0.50 packs/day for 25 years    Types: Cigarettes    Quit date: 09/09/2000  . Smokeless tobacco: Never Used  . Alcohol Use: No    Review of Systems A 10 point review of systems was completed and was negative except for pertinent positives and negatives as mentioned in the history of present illness     Allergies  Ciprofloxacin and Depakote  Home Medications   Prior to Admission medications   Medication Sig Start Date End Date Taking? Authorizing Provider  amLODipine (NORVASC) 10 MG tablet Take 10 mg by mouth every morning.   Yes Historical Provider, MD   ARIPiprazole (ABILIFY) 15 MG tablet Take 15 mg by mouth daily. 10/12/15  Yes Historical Provider, MD  atenolol (TENORMIN) 25 MG tablet Take 25 mg by mouth at bedtime.   Yes Historical Provider, MD  clonazePAM (KLONOPIN) 1 MG tablet Take 1 mg by mouth 2 (two) times daily.   Yes Historical Provider, MD  FLUoxetine (PROZAC) 20 MG capsule Take 20 mg by mouth daily. 10/12/15  Yes Historical Provider, MD  naproxen sodium (ANAPROX) 220 MG tablet Take 440 mg by mouth as needed (headache/ pain).   Yes Historical Provider, MD  oxyCODONE (OXYCONTIN) 40 mg 12 hr tablet Take 30 mg by mouth 2 (two) times daily as needed (pain).  11/16/15  Yes Historical Provider, MD  PROAIR HFA 108 (90 Base) MCG/ACT inhaler Inhale 2 puffs into the lungs every 6 (six) hours as needed for wheezing or shortness of breath.  11/17/15  Yes Historical Provider, MD  RAPAFLO 8 MG CAPS capsule Take 8 mg by mouth daily. 11/20/15  Yes Historical Provider, MD  Ledipasvir-Sofosbuvir (HARVONI) 90-400 MG TABS Take 1 tablet by mouth daily. 04/21/15   Thayer Headings, MD   BP 146/73 mmHg  Pulse 73  Temp(Src) 98.4 F (36.9 C) (Oral)  Resp 18  SpO2 100% Physical Exam  Constitutional: He is oriented to person, place, and time. He appears well-developed and well-nourished.  Overall well-appearing, thin white male.  HENT:  Head: Normocephalic and atraumatic.  Mouth/Throat: Oropharynx is clear and moist.  Very mild dry lips,  oropharynx is moist  Eyes: Conjunctivae are normal. Pupils are equal, round, and reactive to light. Right eye exhibits no discharge. Left eye exhibits no discharge. No scleral icterus.  Neck: Neck supple.  Cardiovascular: Normal rate, regular rhythm and normal heart sounds.   Pulmonary/Chest: Effort normal and breath sounds normal. No respiratory distress. He has no wheezes. He has no rales.  Abdominal: Soft. There is no tenderness.  Musculoskeletal: He exhibits no tenderness.  Neurological: He is alert and oriented to person,  place, and time.  Cranial Nerves II-XII grossly intact  Skin: Skin is warm and dry. No rash noted.  Psychiatric: He has a normal mood and affect.  Nursing note and vitals reviewed.   ED Course  Procedures (including critical care time) Labs Review Labs Reviewed  I-STAT CHEM 8, ED    Imaging Review No results found. I have personally reviewed and evaluated these images and lab results as part of my medical decision-making.   EKG Interpretation None     Filed Vitals:   12/07/15 1109 12/07/15 1305 12/07/15 1510  BP: 148/73 150/80 146/73  Pulse: 61 58 73  Temp: 98.2 F (36.8 C) 98.8 F (37.1 C) 98.4 F (36.9 C)  TempSrc: Oral Oral Oral  Resp: 16 18 18   SpO2: 99% 100% 100%    MDM  [address] Wilfred Salinas is a 65 y.o. male with history of substance abuse currently in detox facility here for evaluation of fatigue. He feels like he may be dehydrated. No fever, chest pain, shortness of breath, numbness or weakness or other medical complaints. On arrival, he is afebrile and hemodynamically stable. No evidence of overt dehydration on exam however we'll give 1 L normal saline. Basic labs are unremarkable. Patient stable for discharge. Return precautions discussed. He verbalizes understanding of this plan and subsequent discharge. No evidence of other acute or emergent pathology at this time. Final diagnoses:  Other fatigue       Comer Locket, PA-C 12/07/15 [place], MD 12/11/15 [phone]

## 2015-12-07 NOTE — Discharge Instructions (Signed)
There does not appear to be an emergent cause for your symptoms at this time. Your exam is reassuring. Your labs were unremarkable. Please follow-up with your doctor for further evaluation management of your symptoms. Return to ED for new or worsening symptoms.  Fatigue Fatigue is feeling tired all of the time, a lack of energy, or a lack of motivation. Occasional or mild fatigue is often a normal response to activity or life in general. However, long-lasting (chronic) or extreme fatigue may indicate an underlying medical condition. HOME CARE INSTRUCTIONS  Watch your fatigue for any changes. The following actions may help to lessen any discomfort you are feeling:  Talk to your health care provider about how much sleep you need each night. Try to get the required amount every night.  Take medicines only as directed by your health care provider.  Eat a healthy and nutritious diet. Ask your health care provider if you need help changing your diet.  Drink enough fluid to keep your urine clear or pale yellow.  Practice ways of relaxing, such as yoga, meditation, massage therapy, or acupuncture.  Exercise regularly.   Change situations that cause you stress. Try to keep your work and personal routine reasonable.  Do not abuse illegal drugs.  Limit alcohol intake to no more than 1 drink per day for nonpregnant women and 2 drinks per day for men. One drink equals 12 ounces of beer, 5 ounces of wine, or 1 ounces of hard liquor.  Take a multivitamin, if directed by your health care provider. SEEK MEDICAL CARE IF:   Your fatigue does not get better.  You have a fever.   You have unintentional weight loss or gain.  You have headaches.   You have difficulty:   Falling asleep.  Sleeping throughout the night.  You feel angry, guilty, anxious, or sad.   You are unable to have a bowel movement (constipation).   You skin is dry.   Your legs or another part of your body is  swollen.  SEEK IMMEDIATE MEDICAL CARE IF:   You feel confused.   Your vision is blurry.  You feel faint or pass out.   You have a severe headache.   You have severe abdominal, pelvic, or back pain.   You have chest pain, shortness of breath, or an irregular or fast heartbeat.   You are unable to urinate or you urinate less than normal.   You develop abnormal bleeding, such as bleeding from the rectum, vagina, nose, lungs, or nipples.  You vomit blood.   You have thoughts about harming yourself or committing suicide.   You are worried that you might harm someone else.    This information is not intended to replace advice given to you by your health care provider. Make sure you discuss any questions you have with your health care provider.   Document Released: 06/23/2007 Document Revised: 09/16/2014 Document Reviewed: 12/28/2013 Elsevier Interactive Patient Education [employer].

## 2015-12-30 ENCOUNTER — Ambulatory Visit (HOSPITAL_BASED_OUTPATIENT_CLINIC_OR_DEPARTMENT_OTHER): Payer: 59

## 2016-01-13 ENCOUNTER — Ambulatory Visit (HOSPITAL_BASED_OUTPATIENT_CLINIC_OR_DEPARTMENT_OTHER)
Admission: RE | Admit: 2016-01-13 | Discharge: 2016-01-13 | Disposition: A | Discharge status: Home / Self Care | Payer: 59 | Source: Ambulatory Visit | Attending: Orthopedic Surgery | Admitting: Orthopedic Surgery

## 2016-01-13 DIAGNOSIS — M542 Cervicalgia: Secondary | ICD-10-CM

## 2016-01-13 DIAGNOSIS — M5030 Other cervical disc degeneration, unspecified cervical region: Secondary | ICD-10-CM | POA: Diagnosis not present

## 2016-01-13 DIAGNOSIS — M4802 Spinal stenosis, cervical region: Secondary | ICD-10-CM | POA: Diagnosis not present

## 2016-01-13 DIAGNOSIS — M50223 Other cervical disc displacement at C6-C7 level: Secondary | ICD-10-CM | POA: Diagnosis not present

## 2016-01-13 DIAGNOSIS — M47892 Other spondylosis, cervical region: Secondary | ICD-10-CM | POA: Diagnosis not present

## 2016-01-29 DIAGNOSIS — B1920 Unspecified viral hepatitis C without hepatic coma: Secondary | ICD-10-CM | POA: Diagnosis not present

## 2016-01-29 DIAGNOSIS — Z86010 Personal history of colonic polyps: Secondary | ICD-10-CM | POA: Diagnosis not present

## 2016-04-15 ENCOUNTER — Other Ambulatory Visit: Payer: Self-pay | Admitting: Gastroenterology

## 2016-04-15 DIAGNOSIS — K648 Other hemorrhoids: Secondary | ICD-10-CM | POA: Diagnosis not present

## 2016-04-15 DIAGNOSIS — Z86010 Personal history of colonic polyps: Secondary | ICD-10-CM | POA: Diagnosis not present

## 2016-04-15 DIAGNOSIS — D126 Benign neoplasm of colon, unspecified: Secondary | ICD-10-CM | POA: Diagnosis not present

## 2016-04-15 DIAGNOSIS — D123 Benign neoplasm of transverse colon: Secondary | ICD-10-CM | POA: Diagnosis not present

## 2016-04-17 DIAGNOSIS — F3341 Major depressive disorder, recurrent, in partial remission: Secondary | ICD-10-CM | POA: Diagnosis not present

## 2016-04-17 DIAGNOSIS — F411 Generalized anxiety disorder: Secondary | ICD-10-CM | POA: Diagnosis not present

## 2016-04-17 DIAGNOSIS — F341 Dysthymic disorder: Secondary | ICD-10-CM | POA: Diagnosis not present

## 2016-04-20 DIAGNOSIS — H52223 Regular astigmatism, bilateral: Secondary | ICD-10-CM | POA: Diagnosis not present

## 2016-04-20 DIAGNOSIS — H5203 Hypermetropia, bilateral: Secondary | ICD-10-CM | POA: Diagnosis not present

## 2016-04-20 DIAGNOSIS — H524 Presbyopia: Secondary | ICD-10-CM | POA: Diagnosis not present

## 2016-04-23 ENCOUNTER — Encounter: Payer: Self-pay | Admitting: Internal Medicine

## 2016-04-23 ENCOUNTER — Ambulatory Visit (INDEPENDENT_AMBULATORY_CARE_PROVIDER_SITE_OTHER): Payer: 59 | Admitting: Internal Medicine

## 2016-04-23 VITALS — BP 128/70 | HR 76 | Temp 97.4°F | Ht 71.0 in | Wt 149.0 lb

## 2016-04-23 DIAGNOSIS — F1910 Other psychoactive substance abuse, uncomplicated: Secondary | ICD-10-CM

## 2016-04-23 DIAGNOSIS — K7400 Hepatic fibrosis, unspecified: Secondary | ICD-10-CM

## 2016-04-23 DIAGNOSIS — B182 Chronic viral hepatitis C: Secondary | ICD-10-CM | POA: Diagnosis not present

## 2016-04-23 LAB — CBC WITH DIFFERENTIAL/PLATELET
BASOS PCT: 0 %
Basophils Absolute: 0 cells/uL (ref 0–200)
EOS ABS: 73 {cells}/uL (ref 15–500)
Eosinophils Relative: 1 %
HCT: 38.6 % (ref 38.5–50.0)
HEMOGLOBIN: 12.1 g/dL — AB (ref 13.2–17.1)
LYMPHS PCT: 27 %
Lymphs Abs: 1971 cells/uL (ref 850–3900)
MCH: 26.7 pg — AB (ref 27.0–33.0)
MCHC: 31.3 g/dL — ABNORMAL LOW (ref 32.0–36.0)
MCV: 85 fL (ref 80.0–100.0)
MONO ABS: 803 {cells}/uL (ref 200–950)
MPV: 9.2 fL (ref 7.5–12.5)
Monocytes Relative: 11 %
NEUTROS ABS: 4453 {cells}/uL (ref 1500–7800)
Neutrophils Relative %: 61 %
PLATELETS: 251 10*3/uL (ref 140–400)
RBC: 4.54 MIL/uL (ref 4.20–5.80)
RDW: 14.4 % (ref 11.0–15.0)
WBC: 7.3 10*3/uL (ref 3.8–10.8)

## 2016-04-23 LAB — COMPLETE METABOLIC PANEL WITH GFR
ALT: 23 U/L (ref 9–46)
AST: 28 U/L (ref 10–35)
Albumin: 4.3 g/dL (ref 3.6–5.1)
Alkaline Phosphatase: 67 U/L (ref 40–115)
BUN: 13 mg/dL (ref 7–25)
CALCIUM: 9.1 mg/dL (ref 8.6–10.3)
CHLORIDE: 108 mmol/L (ref 98–110)
CO2: 24 mmol/L (ref 20–31)
CREATININE: 1.1 mg/dL (ref 0.70–1.25)
GFR, EST AFRICAN AMERICAN: 82 mL/min (ref >=60)
GFR, EST NON AFRICAN AMERICAN: 71 mL/min (ref >=60)
Glucose, Bld: 105 mg/dL — ABNORMAL HIGH (ref 65–99)
POTASSIUM: 4.2 mmol/L (ref 3.5–5.3)
Sodium: 140 mmol/L (ref 135–146)
Total Bilirubin: 0.3 mg/dL (ref 0.2–1.2)
Total Protein: 6.6 g/dL (ref 6.1–8.1)

## 2016-04-24 DIAGNOSIS — K7400 Hepatic fibrosis, unspecified: Secondary | ICD-10-CM | POA: Insufficient documentation

## 2016-04-24 NOTE — Assessment & Plan Note (Signed)
Will confirm failure and check genotype to be sure it is not a new infection.  Also will check NS5A and NS3/4A for any resistance.  I will try to get Vel/sof/PI or consider vel/sof with rib for 24 weeks.  willhave him return in 2 weeks to go over options after resistance testing.

## 2016-04-24 NOTE — Progress Notes (Signed)
   Subjective:    Patient ID: Travis Salinas, male    DOB: [birth year], 65 y.o.   MRN: [id number]  HPI Here for follow up of HCV. Has genotype 1a, elastography with F2/3 and underwent treatment with Harvoni.  Completed 12 weeks and followed by Dr. Paulita Fujita during treatment.  SVR12 reportedly was negative but SVR24 came back positive (results not available).  Patient denies any current drug use or risk of reinfection.  He continued to follow with our substance abuse counselor through last October but then stopped.    Review of Systems  Constitutional: Negative for fatigue.  Skin: Negative for rash.  Neurological: Negative for dizziness and headaches.       Objective:   Physical Exam  Constitutional: He appears well-developed and well-nourished.  Eyes: No scleral icterus.  Cardiovascular: Normal rate, regular rhythm and normal heart sounds.   Pulmonary/Chest: Effort normal and breath sounds normal. No respiratory distress.  Lymphadenopathy:    He has no cervical adenopathy.  Skin: No rash noted.    Social History   Social History  . Marital status: Married    Spouse name: N/A  . Number of children: N/A  . Years of education: N/A   Occupational History  . Not on file.   Social History Main Topics  . Smoking status: Current Some Day Smoker    Packs/day: 0.50    Years: 25.00    Types: Cigarettes  . Smokeless tobacco: Never Used  . Alcohol use No  . Drug use:     Types: Heroin     Comment: patient states last use5/1/16  . Sexual activity: Not on file   Other Topics Concern  . Not on file   Social History Narrative  . No narrative on file        Assessment & Plan:

## 2016-04-24 NOTE — Assessment & Plan Note (Signed)
He reports continuing to be drug free

## 2016-04-24 NOTE — Assessment & Plan Note (Signed)
Normal platelets and albumin.  No alcohol use.  Followed by Dr. Paulita Fujita

## 2016-04-25 NOTE — Addendum Note (Signed)
Addended by: Dolan Amen D on: 04/25/2016 09:48 AM   Modules accepted: Orders

## 2016-04-27 LAB — HCV RNA,LIPA RFLX NS5A DRUG RESIST

## 2016-04-28 LAB — HCV RNA NS5A DRUG RESISTANCE

## 2016-05-03 DIAGNOSIS — M542 Cervicalgia: Secondary | ICD-10-CM | POA: Diagnosis not present

## 2016-05-07 ENCOUNTER — Ambulatory Visit: Payer: 59 | Admitting: Internal Medicine

## 2016-05-08 LAB — HEPATITIS C VIRAL RNA NS3 GENOTYPE

## 2016-05-15 ENCOUNTER — Ambulatory Visit (INDEPENDENT_AMBULATORY_CARE_PROVIDER_SITE_OTHER): Payer: 59 | Admitting: Internal Medicine

## 2016-05-15 ENCOUNTER — Encounter: Payer: Self-pay | Admitting: Internal Medicine

## 2016-05-15 DIAGNOSIS — B182 Chronic viral hepatitis C: Secondary | ICD-10-CM

## 2016-05-15 DIAGNOSIS — K7400 Hepatic fibrosis, unspecified: Secondary | ICD-10-CM

## 2016-05-15 NOTE — Assessment & Plan Note (Signed)
Moderate.  No follow up needed.

## 2016-05-15 NOTE — Progress Notes (Signed)
   Subjective:    Patient ID: Travis Salinas, male    DOB: 1951/02/01, 65 y.o.   MRN: [id number]  HPI Here for follow up of HCV. Has genotype 1a, elastography with F2/3 and underwent treatment with Harvoni.  Completed 12 weeks and followed by Dr. Paulita Fujita during treatment.  SVR12 reportedly was negative but SVR24 came back positive.  Patient denies any current drug use or risk of reinfection.  I rechecked and is positive with same genotype, 1a.  No significant NS5A resistance.  Eager for a second try.    Review of Systems  Constitutional: Negative for fatigue.  Skin: Negative for rash.  Neurological: Negative for dizziness and headaches.       Objective:   Physical Exam  Constitutional: He appears well-developed and well-nourished.  Eyes: No scleral icterus.  Cardiovascular: Normal rate, regular rhythm and normal heart sounds.   Pulmonary/Chest: Effort normal and breath sounds normal. No respiratory distress.  Lymphadenopathy:    He has no cervical adenopathy.  Skin: No rash noted.    Social History   Social History  . Marital status: Married    Spouse name: N/A  . Number of children: N/A  . Years of education: N/A   Occupational History  . Not on file.   Social History Main Topics  . Smoking status: Current Some Day Smoker    Packs/day: 0.50    Years: 25.00    Types: Cigarettes  . Smokeless tobacco: Never Used  . Alcohol use No  . Drug use:     Types: Heroin     Comment: patient states last use5/1/16  . Sexual activity: Not on file   Other Topics Concern  . Not on file   Social History Narrative  . No narrative on file        Assessment & Plan:

## 2016-05-15 NOTE — Assessment & Plan Note (Signed)
I discussed with him different options for retreatment, especially with newer salvage regimens including Vosevi and Mavyret.  In discussion with our team we have decided to send for [place] approval through his insurance for 16 weeks.  Will let him know when it is approved.

## 2016-05-28 ENCOUNTER — Encounter: Payer: Self-pay | Admitting: Pharmacy Technician

## 2016-05-31 DIAGNOSIS — M25561 Pain in right knee: Secondary | ICD-10-CM | POA: Diagnosis not present

## 2016-05-31 DIAGNOSIS — M545 Low back pain: Secondary | ICD-10-CM | POA: Diagnosis not present

## 2016-06-13 ENCOUNTER — Ambulatory Visit: Payer: 59

## 2016-06-17 DIAGNOSIS — S61213A Laceration without foreign body of left middle finger without damage to nail, initial encounter: Secondary | ICD-10-CM | POA: Diagnosis not present

## 2016-06-17 DIAGNOSIS — J209 Acute bronchitis, unspecified: Secondary | ICD-10-CM | POA: Diagnosis not present

## 2016-06-17 DIAGNOSIS — Z23 Encounter for immunization: Secondary | ICD-10-CM | POA: Diagnosis not present

## 2016-06-28 ENCOUNTER — Ambulatory Visit: Payer: 59

## 2016-07-01 ENCOUNTER — Ambulatory Visit (INDEPENDENT_AMBULATORY_CARE_PROVIDER_SITE_OTHER): Payer: 59 | Admitting: Pharmacist Clinician (PhC)/ Clinical Pharmacy Specialist

## 2016-07-01 DIAGNOSIS — B182 Chronic viral hepatitis C: Secondary | ICD-10-CM

## 2016-07-01 NOTE — Progress Notes (Signed)
Agreed with Emily's note. This is a guy that is likely to be re-infected rather than failure of Harvoni. Apparently, according to outpt pharmacy records, he has a hx of pharmacy shopping. I didn't want to dwell into drug history since his wife was there. I did tell him that this is likely his last chance for therapy. We are going to get a VL today and treat him for a full 16 wks of Mavyret.

## 2016-07-01 NOTE — Patient Instructions (Addendum)
Labs today Follow-up with Dr. Linus Salmons January 11th 3:45 PM Continue to take [place] daily

## 2016-07-01 NOTE — Progress Notes (Signed)
HPI: Travis Salinas is a 65 y.o. male who reports for a follow-up of his chronic Hepatitis C.   Allergies: Allergies  Allergen Reactions  . Ciprofloxacin Nausea Only  . Depakote [Divalproex Sodium] Other (See Comments)    hallucinations    Vitals:    Past Medical History: Past Medical History:  Diagnosis Date  . Hypertension     Social History: Social History   Social History  . Marital status: Married    Spouse name: N/A  . Number of children: N/A  . Years of education: N/A   Social History Main Topics  . Smoking status: Current Some Day Smoker    Packs/day: 0.50    Years: 25.00    Types: Cigarettes  . Smokeless tobacco: Never Used  . Alcohol use No  . Drug use:     Types: Heroin     Comment: patient states last use5/1/16  . Sexual activity: Not on file   Other Topics Concern  . Not on file   Social History Narrative  . No narrative on file    Labs: No results found for: HIV1RNAQUANT, HIV1RNAVL, CD4TABS, HEPBSAB, HEPBSAG, HCVAB  No results found for: HCVGENOTYPE, HEPCGENOTYPE  No flowsheet data found.  AST (U/L)  Date Value  04/23/2016 28  11/11/2013 44 (H)   ALT (U/L)  Date Value  04/23/2016 23  11/11/2013 27     Fibrosis Score: F2/3  as assessed by elastography  Child-Pugh Score: A  Previous Treatment Regimen: Harvoni  Assessment: Travis Salinas reports today with his wife to follow-up after starting salvage therapy with Mavyret on 9/18. He was previously treated with 3 months of Harvoni. The initial 3 month cure labs were negative but his 6 month labs came back positive for Hepatitis C after treatment.  Today he reports no trouble with compliance or with tolerating the [place]. His medication list was reviewed and updated. We reinforced that this will be the last chance he has for cure. We will obtain a Hepatitis C viral load today and schedule him to come back in January for an end of treatment consultation with Dr. Linus Salmons.     Recommendations: Continue taking Mavyret every day  Follow-up with Dr. Linus Salmons on January 11th at [place] [facility], Pharm.D. PGY1 Pharmacy Resident  Pager: [phone] 07/01/2016, 4:28 PM

## 2016-07-03 LAB — HEPATITIS C RNA QUANTITATIVE: HCV QUANT: NOT DETECTED [IU]/mL (ref <15)

## 2016-07-30 DIAGNOSIS — F341 Dysthymic disorder: Secondary | ICD-10-CM | POA: Diagnosis not present

## 2016-07-30 DIAGNOSIS — F332 Major depressive disorder, recurrent severe without psychotic features: Secondary | ICD-10-CM | POA: Diagnosis not present

## 2016-07-30 DIAGNOSIS — M542 Cervicalgia: Secondary | ICD-10-CM | POA: Diagnosis not present

## 2016-07-30 DIAGNOSIS — F411 Generalized anxiety disorder: Secondary | ICD-10-CM | POA: Diagnosis not present

## 2016-08-16 ENCOUNTER — Ambulatory Visit: Payer: 59 | Admitting: Cardiology

## 2016-09-17 ENCOUNTER — Encounter (INDEPENDENT_AMBULATORY_CARE_PROVIDER_SITE_OTHER): Payer: Self-pay

## 2016-09-17 ENCOUNTER — Ambulatory Visit (INDEPENDENT_AMBULATORY_CARE_PROVIDER_SITE_OTHER): Payer: 59 | Admitting: Cardiology

## 2016-09-17 ENCOUNTER — Encounter: Payer: Self-pay | Admitting: Cardiology

## 2016-09-17 VITALS — BP 116/68 | HR 63 | Ht 72.0 in | Wt 147.8 lb

## 2016-09-17 DIAGNOSIS — Z8249 Family history of ischemic heart disease and other diseases of the circulatory system: Secondary | ICD-10-CM | POA: Diagnosis not present

## 2016-09-17 DIAGNOSIS — F17200 Nicotine dependence, unspecified, uncomplicated: Secondary | ICD-10-CM | POA: Diagnosis not present

## 2016-09-17 DIAGNOSIS — I10 Essential (primary) hypertension: Secondary | ICD-10-CM | POA: Diagnosis not present

## 2016-09-17 DIAGNOSIS — I208 Other forms of angina pectoris: Secondary | ICD-10-CM

## 2016-09-17 MED ORDER — ASPIRIN EC 81 MG PO TBEC: 81.0000 mg | DELAYED_RELEASE_TABLET | Freq: Every day | ORAL | 3 refills | Status: DC

## 2016-09-17 NOTE — Patient Instructions (Addendum)
Medication Instructions:  Your physician has recommended you make the following change in your medication:  Start aspirin 81 mg by mouth daily.     Labwork: none  Testing/Procedures: Your physician has requested that you have an exercise stress myoview. For further information please visit [email or web address]. Please follow instruction sheet, as given.   Follow-Up: Your physician recommends that you schedule a follow-up appointment as needed with Dr. Marlou Porch.    Any Other Special Instructions Will Be Listed Below (If Applicable).     If you need a refill on your cardiac medications before your next appointment, please call your pharmacy.

## 2016-09-17 NOTE — Progress Notes (Signed)
Cardiology Office Note    Date:  09/17/2016   ID:  Travis Salinas, DOB 1951-08-11, MRN [id number]  PCP:  [name], MD  Cardiologist:   Candee Furbish, MD     History of Present Illness:  Travis Salinas is a 66 y.o. male here for cardiac evaluation given his family history of coronary artery disease. His mother and father died from heart attacks, siblings have heart disease. He was wondering if he could get a stress test.  Parents dx in 40-50's. They're both operated on by Dr. Dennard Schaumann.  Overall he has been experiencing shortness of breath with activity, such as going up stairs. He has been treating this to his ongoing smoking, states COPD. This could be anginal equivalent.  Past Medical History:  Diagnosis Date  . Hypertension     Past Surgical History:  Procedure Laterality Date  . INCISION AND DRAINAGE ABSCESS Right 11/12/2013   Procedure: INCISION AND DRAINAGE AND OPEN PACKING OF RIGHT CALF  ABSCESS;  Surgeon: Earnstine Regal, MD;  Location: [facility];  Service: General;  Laterality: Right;    Current Medications: Outpatient Medications Prior to Visit  Medication Sig Dispense Refill  . amLODipine (NORVASC) 10 MG tablet Take 10 mg by mouth every morning.    . ARIPiprazole (ABILIFY) 15 MG tablet Take 15 mg by mouth daily.  5  . atenolol (TENORMIN) 25 MG tablet Take 25 mg by mouth at bedtime.    . clonazePAM (KLONOPIN) 1 MG tablet Take 1 mg by mouth 2 (two) times daily.    Marland Kitchen FLUoxetine (PROZAC) 20 MG capsule Take 20 mg by mouth daily.  5  . lamoTRIgine (LAMICTAL) 200 MG tablet Take 200 mg by mouth every morning.    . naproxen sodium (ANAPROX) 220 MG tablet Take 440 mg by mouth as needed (headache/ pain).    Marland Kitchen oxyCODONE (OXYCONTIN) 40 mg 12 hr tablet Take 30 mg by mouth 2 (two) times daily as needed (pain).   0  . PROAIR HFA 108 (90 Base) MCG/ACT inhaler Inhale 2 puffs into the lungs every 6 (six) hours as needed for wheezing or shortness of breath.   1  . RAPAFLO 8 MG  CAPS capsule Take 8 mg by mouth daily.  3   No facility-administered medications prior to visit.      Allergies:   Ciprofloxacin and Depakote [divalproex sodium]   Social History   Social History  . Marital status: Married    Spouse name: N/A  . Number of children: N/A  . Years of education: N/A   Social History Main Topics  . Smoking status: Current Some Day Smoker    Packs/day: 0.50    Years: 25.00    Types: Cigarettes  . Smokeless tobacco: Never Used  . Alcohol use No  . Drug use:     Types: Heroin     Comment: patient states last use5/1/16  . Sexual activity: Not Asked   Other Topics Concern  . None   Social History Narrative  . None     Family History:  Strong family history of early coronary disease, mother and father in their 80s and 66s. They were operated on by Dr. Dennard Schaumann. Multiple siblings with CAD. His brother recently had a stent placed.  ROS:   Please see the history of present illness.   Denies any syncope, bleeding, orthopnea, PND. ROS All other systems reviewed and are negative.   PHYSICAL EXAM:   VS:  BP 116/68  Pulse 63   Ht 6' (1.829 m)   Wt 147 lb 12.8 oz (67 kg)   BMI 20.05 kg/m    GEN: Well nourished, well developed, in no acute distress , smells of smoke HEENT: normal  Neck: no JVD, carotid bruits, or masses Cardiac: RRR; no murmurs, rubs, or gallops,no edema  Respiratory:  clear to auscultation bilaterally, normal work of breathing GI: soft, nontender, nondistended, + BS MS: no deformity or atrophy  Skin: warm and dry, scaly rash, facial noted. Neuro:  Alert and Oriented x 3, Strength and sensation are intact Psych: euthymic mood, full affect  Wt Readings from Last 3 Encounters:  09/17/16 147 lb 12.8 oz (67 kg)  05/15/16 142 lb (64.4 kg)  04/23/16 149 lb (67.6 kg)      Studies/Labs Reviewed:   EKG:  EKG is ordered today.  The ekg ordered today demonstrates 09/17/16-sinus rhythm 62 biatrial enlargement. Personally  viewed  Recent Labs: 04/23/2016: ALT 23; BUN 13; Creat 1.10; Hemoglobin 12.1; Platelets 251; Potassium 4.2; Sodium 140   Lipid Panel No results found for: CHOL, TRIG, HDL, CHOLHDL, VLDL, LDLCALC, LDLDIRECT  Additional studies/ records that were reviewed today include:  Prior office note, history, lab work reviewed. LDL 73, HDL 49    ASSESSMENT:    1. Stable angina pectoris ([place])   2. Current smoker   3. Essential hypertension   4. Family history of early CAD      PLAN:  In order of problems listed above:  Angina  - He is having shortness of breath with activity which could be an anginal equivalent. Of course this could also be COPD/smoking related. Nonetheless given his very strong family history of CAD and ongoing smoking, age I will pursue nuclear stress test. We discussed other options for testing. I think physiologic testing makes the most sense for him.  Current smoker  - Strongly encourage tobacco cessation. This may be single most modifiable risk factor.  Prevention  - Aspirin 81 mg a day. Understands bleeding risk.  Hypertension, essential  - Currently well controlled. Medications reviewed.  - Hold atenolol 25 mg prior to stress test.      Medication Adjustments/Labs and Tests Ordered: Current medicines are reviewed at length with the patient today.  Concerns regarding medicines are outlined above.  Medication changes, Labs and Tests ordered today are listed in the Patient Instructions below. Patient Instructions  Medication Instructions:  Your physician has recommended you make the following change in your medication:  Start aspirin 81 mg by mouth daily.     Labwork: none  Testing/Procedures: Your physician has requested that you have an exercise stress myoview. For further information please visit [email or web address]. Please follow instruction sheet, as given.   Follow-Up: Your physician recommends that you schedule a follow-up appointment as needed  with Dr. Marlou Porch.    Any Other Special Instructions Will Be Listed Below (If Applicable).     If you need a refill on your cardiac medications before your next appointment, please call your pharmacy.      Signed, Candee Furbish, MD  09/17/2016 12:23 PM    [facility] [place], [place],   [address] Phone: [phone]; Fax: [phone]

## 2016-09-19 ENCOUNTER — Encounter: Payer: Self-pay | Admitting: Internal Medicine

## 2016-09-19 ENCOUNTER — Ambulatory Visit (INDEPENDENT_AMBULATORY_CARE_PROVIDER_SITE_OTHER): Payer: 59 | Admitting: Internal Medicine

## 2016-09-19 VITALS — BP 126/56 | HR 71 | Temp 98.3°F | Ht 71.0 in | Wt 147.0 lb

## 2016-09-19 DIAGNOSIS — K7400 Hepatic fibrosis, unspecified: Secondary | ICD-10-CM

## 2016-09-19 DIAGNOSIS — B182 Chronic viral hepatitis C: Secondary | ICD-10-CM

## 2016-09-19 NOTE — Assessment & Plan Note (Signed)
About completed and will check viral load today. rtc 6 months with PharmD for SVR 24

## 2016-09-19 NOTE — Progress Notes (Signed)
   Subjective:    Patient ID: Travis Salinas, male    DOB: Dec 05, 1950, 66 y.o.   MRN: [id number]  HPI Here for follow up of HCV  Has genotype 1a and treated with Harvoni for 12 weeks by Dr. Paulita Fujita and at [facility] noted relapse of HCV (SVR 12 was negative).  He denied any further drug use though did have a recent history of IVDU prior to initial treatment.  He is about to complete retreatement with 16 weeks of Mavyret with early viral load undetectable.  No new issues.    Review of Systems  Constitutional: Negative for fatigue.  Gastrointestinal: Negative for diarrhea.  Neurological: Negative for dizziness and headaches.       Objective:   Physical Exam  Constitutional: He appears well-developed and well-nourished. No distress.  Eyes: No scleral icterus.  Cardiovascular: Normal rate, regular rhythm and normal heart sounds.   Skin: No rash noted.          Assessment & Plan:

## 2016-09-19 NOTE — Assessment & Plan Note (Signed)
Moderate fibrosis. Knows not to drink.

## 2016-09-23 LAB — HEPATITIS C RNA QUANTITATIVE: HCV Quantitative: NOT DETECTED IU/mL (ref <15)

## 2016-09-27 DIAGNOSIS — R109 Unspecified abdominal pain: Secondary | ICD-10-CM | POA: Diagnosis not present

## 2016-10-02 ENCOUNTER — Telehealth (HOSPITAL_COMMUNITY): Payer: Self-pay | Admitting: *Deleted

## 2016-10-02 NOTE — Telephone Encounter (Signed)
Patient given detailed instructions per Myocardial Perfusion Study Information Sheet for the test on 10/04/16. Patient notified to arrive 15 minutes early and that it is imperative to arrive on time for appointment to keep from having the test rescheduled.  If you need to cancel or reschedule your appointment, please call the office within 24 hours of your appointment. Failure to do so may result in a cancellation of your appointment, and a $50 no show fee. Patient verbalized understanding. Kirstie Peri

## 2016-10-04 ENCOUNTER — Ambulatory Visit (HOSPITAL_COMMUNITY): Payer: 59 | Attending: Cardiovascular Disease

## 2016-10-04 DIAGNOSIS — I208 Other forms of angina pectoris: Secondary | ICD-10-CM | POA: Diagnosis not present

## 2016-10-04 LAB — MYOCARDIAL PERFUSION IMAGING
CHL CUP NUCLEAR SRS: 2
CHL CUP NUCLEAR SSS: 4
CSEPPHR: 121 {beats}/min
LV dias vol: 114 mL (ref 62–150)
LV sys vol: 48 mL
RATE: 0.28
Rest HR: 54 {beats}/min
SDS: 3
TID: 0.87

## 2016-10-04 MED ORDER — TECHNETIUM TC 99M TETROFOSMIN IV KIT
10.9000 | PACK | Freq: Once | INTRAVENOUS | Status: AC | PRN
  Administered 2016-10-04: 10.9 via INTRAVENOUS
  Filled 2016-10-04: qty 11

## 2016-10-04 MED ORDER — REGADENOSON 0.4 MG/5ML IV SOLN
0.4000 mg | Freq: Once | INTRAVENOUS | Status: AC
  Administered 2016-10-04: 0.4 mg via INTRAVENOUS

## 2016-10-04 MED ORDER — TECHNETIUM TC 99M TETROFOSMIN IV KIT
33.0000 | PACK | Freq: Once | INTRAVENOUS | Status: AC | PRN
  Administered 2016-10-04: 33 via INTRAVENOUS
  Filled 2016-10-04: qty 33

## 2016-10-29 DIAGNOSIS — N400 Enlarged prostate without lower urinary tract symptoms: Secondary | ICD-10-CM | POA: Diagnosis not present

## 2016-10-29 DIAGNOSIS — N529 Male erectile dysfunction, unspecified: Secondary | ICD-10-CM | POA: Diagnosis not present

## 2016-10-29 DIAGNOSIS — J449 Chronic obstructive pulmonary disease, unspecified: Secondary | ICD-10-CM | POA: Diagnosis not present

## 2016-10-29 DIAGNOSIS — I10 Essential (primary) hypertension: Secondary | ICD-10-CM | POA: Diagnosis not present

## 2016-10-29 DIAGNOSIS — Z0000 Encounter for general adult medical examination without abnormal findings: Secondary | ICD-10-CM | POA: Diagnosis not present

## 2016-10-29 DIAGNOSIS — Z8619 Personal history of other infectious and parasitic diseases: Secondary | ICD-10-CM | POA: Diagnosis not present

## 2016-11-01 ENCOUNTER — Emergency Department (INDEPENDENT_AMBULATORY_CARE_PROVIDER_SITE_OTHER)
Admission: EM | Admit: 2016-11-01 | Discharge: 2016-11-01 | Disposition: A | Discharge status: Home / Self Care | Payer: 59 | Source: Home / Self Care | Attending: Family Medicine | Admitting: Family Medicine

## 2016-11-01 ENCOUNTER — Encounter: Payer: Self-pay | Admitting: Emergency Medicine

## 2016-11-01 DIAGNOSIS — M109 Gout, unspecified: Secondary | ICD-10-CM | POA: Diagnosis not present

## 2016-11-01 MED ORDER — COLCHICINE 0.6 MG PO TABS: ORAL_TABLET | ORAL | 0 refills | Status: DC

## 2016-11-01 MED ORDER — NAPROXEN 375 MG PO TABS: 375.0000 mg | ORAL_TABLET | Freq: Two times a day (BID) | ORAL | 0 refills | Status: DC

## 2016-11-01 MED ORDER — PREDNISONE 20 MG PO TABS: ORAL_TABLET | ORAL | 0 refills | Status: DC

## 2016-11-01 MED ORDER — HYDROCODONE-ACETAMINOPHEN 5-325 MG PO TABS: 1.0000 | ORAL_TABLET | Freq: Four times a day (QID) | ORAL | 0 refills | Status: DC | PRN

## 2016-11-01 NOTE — ED Provider Notes (Signed)
CSN: [id number]     Arrival date & time 11/01/16  0813 History   First MD Initiated Contact with Patient 11/01/16 0840     Chief Complaint  Patient presents with  . Foot Pain   (Consider location/radiation/quality/duration/timing/severity/associated sxs/prior Treatment) HPI Travis Salinas Travis Salinas is a 66 y.o. male presenting to UC with c/o 2 days of worsening pain, redness, warmth, and swelling of Left ankle.  Pain is aching and burning, 10/10, worse with ambulation. No recent injury. Denies hx of gout or blood clots.  He has not taken anything for pain this morning.  Pain was worse last night, difficult to sleep due to burning pain.   Past Medical History:  Diagnosis Date  . Hypertension    Past Surgical History:  Procedure Laterality Date  . INCISION AND DRAINAGE ABSCESS Right 11/12/2013   Procedure: INCISION AND DRAINAGE AND OPEN PACKING OF RIGHT CALF  ABSCESS;  Surgeon: Earnstine Regal, MD;  Location: [facility];  Service: General;  Laterality: Right;   History reviewed. No pertinent family history. Social History  Substance Use Topics  . Smoking status: Current Some Day Smoker    Packs/day: 0.50    Years: 25.00    Types: Cigarettes  . Smokeless tobacco: Never Used  . Alcohol use No    Review of Systems  Constitutional: Negative for chills and fever.  Gastrointestinal: Negative for nausea and vomiting.  Musculoskeletal: Positive for arthralgias, gait problem, joint swelling and myalgias. Negative for back pain.  Skin: Positive for color change. Negative for rash and wound.  Neurological: Negative for weakness and numbness.    Allergies  Ciprofloxacin and Depakote [divalproex sodium]  Home Medications   Prior to Admission medications   Medication Sig Start Date End Date Taking? Authorizing Provider  amLODipine (NORVASC) 10 MG tablet Take 10 mg by mouth every morning.    Historical Provider, MD  ARIPiprazole (ABILIFY) 15 MG tablet Take 15 mg by mouth daily. 10/12/15   Historical  Provider, MD  aspirin EC 81 MG tablet Take 1 tablet (81 mg total) by mouth daily. 09/17/16   Jerline Pain, MD  atenolol (TENORMIN) 25 MG tablet Take 25 mg by mouth at bedtime.    Historical Provider, MD  clonazePAM (KLONOPIN) 1 MG tablet Take 1 mg by mouth 2 (two) times daily.    Historical Provider, MD  colchicine 0.6 MG tablet Take 2 tabs (1.2mg ) followed in 1 hour by 1 tab (0.6mg ). 1 tab (0.6) daily for 2 days 11/01/16   Noland Fordyce, PA-C  FLUoxetine (PROZAC) 20 MG capsule Take 20 mg by mouth daily. 10/12/15   Historical Provider, MD  HYDROcodone-acetaminophen (NORCO/VICODIN) 5-325 MG tablet Take 1-2 tablets by mouth every 6 (six) hours as needed for severe pain. 11/01/16   Noland Fordyce, PA-C  lamoTRIgine (LAMICTAL) 200 MG tablet Take 200 mg by mouth every morning.    Historical Provider, MD  MAVYRET 100-40 MG TABS Take 1 tablet by mouth 3 (three) times daily. 08/14/16   Historical Provider, MD  naproxen (NAPROSYN) 375 MG tablet Take 1 tablet (375 mg total) by mouth 2 (two) times daily with a meal. 11/01/16   Noland Fordyce, PA-C  naproxen sodium (ANAPROX) 220 MG tablet Take 440 mg by mouth as needed (headache/ pain).    Historical Provider, MD  oxyCODONE (OXYCONTIN) 40 mg 12 hr tablet Take 30 mg by mouth 2 (two) times daily as needed (pain).  11/16/15   Historical Provider, MD  predniSONE (DELTASONE) 20 MG tablet 3  tabs po day one, then 2 po daily x 4 days 11/01/16   Noland Fordyce, PA-C  PROAIR HFA 108 [phone] Base) MCG/ACT inhaler Inhale 2 puffs into the lungs every 6 (six) hours as needed for wheezing or shortness of breath.  11/17/15   Historical Provider, MD  RAPAFLO 8 MG CAPS capsule Take 8 mg by mouth daily. 11/20/15   Historical Provider, MD   Meds Ordered and Administered this Visit  Medications - No data to display  BP 181/75 (BP Location: Right Arm)   Pulse 88   Temp 98.2 F (36.8 C) (Oral)   Wt 147 lb (66.7 kg)   SpO2 96%   BMI 20.50 kg/m  No data found.   Physical Exam   Constitutional: He is oriented to person, place, and time. He appears well-developed and well-nourished. No distress.  HENT:  Head: Normocephalic and atraumatic.  Eyes: EOM are normal.  Neck: Normal range of motion.  Cardiovascular: Normal rate.   Pulses:      Dorsalis pedis pulses are 2+ on the left side.       Posterior tibial pulses are 2+ on the left side.  Pulmonary/Chest: Effort normal.  Musculoskeletal: Normal range of motion. He exhibits edema and tenderness.  Left ankle: circumferential mild to moderate edema, diffuse tenderness. Full ROM. Left foot- non-tender. Left calf- soft, non-tender  Neurological: He is alert and oriented to person, place, and time.  Skin: Skin is warm and dry. Capillary refill takes less than 2 seconds. He is not diaphoretic. There is erythema.  Left ankle: skin in tact. Diffuse erythema, warmth and tenderness to light touch of ankle. Erythema does blanch. No red streaking.  Psychiatric: He has a normal mood and affect. His behavior is normal.  Nursing note and vitals reviewed.   Urgent Care Course     Procedures (including critical care time)  Labs Review Labs Reviewed - No data to display  Imaging Review No results found.    MDM   1. Acute gout of left ankle, unspecified cause    Exam c/w gout. Doubt clot due to good strong pulses. Calf is soft, non-tender.  Low concern for cellulitis at this time due to localization of erythema and tenderness. Pt is afebrile. Will treat as gout. Encouraged f/u with his PCP next week. Pt does have hx of substance abuse, 6 tabs norco for pt to take in evening to allow some pain relief until colchicine, prednisone, and naproxen can start helping with pain. Pt has a walker at home he can use to help ambulate.     Noland Fordyce, PA-C 11/01/16 1023

## 2016-11-01 NOTE — Discharge Instructions (Signed)
°  Norco/Vicodin (hydrocodone-acetaminophen) is a narcotic pain medication, do not combine these medications with others containing tylenol. While taking, do not drink alcohol, drive, or perform any other activities that requires focus while taking these medications.  ° °

## 2016-11-01 NOTE — ED Triage Notes (Signed)
Pt c/o left foot and ankle pain, redness, swelling and warmth that started x2 days ago. No hx of gout or clots. Taking aleve with no relief.

## 2016-11-03 ENCOUNTER — Emergency Department (HOSPITAL_BASED_OUTPATIENT_CLINIC_OR_DEPARTMENT_OTHER): Payer: 59

## 2016-11-03 ENCOUNTER — Inpatient Hospital Stay (HOSPITAL_COMMUNITY): Payer: 59 | Admitting: Certified Registered"

## 2016-11-03 ENCOUNTER — Inpatient Hospital Stay (HOSPITAL_BASED_OUTPATIENT_CLINIC_OR_DEPARTMENT_OTHER)
Admission: EM | Admit: 2016-11-03 | Discharge: 2016-11-09 | DRG: 982 | Disposition: A | Discharge status: Skilled Nursing Facility | Payer: 59 | Attending: Internal Medicine | Admitting: Internal Medicine

## 2016-11-03 ENCOUNTER — Encounter (HOSPITAL_COMMUNITY): Payer: Self-pay

## 2016-11-03 ENCOUNTER — Encounter (HOSPITAL_COMMUNITY)
Admission: EM | Disposition: A | Discharge status: Skilled Nursing Facility | Payer: Self-pay | Source: Home / Self Care | Attending: Internal Medicine

## 2016-11-03 DIAGNOSIS — D62 Acute posthemorrhagic anemia: Secondary | ICD-10-CM | POA: Diagnosis not present

## 2016-11-03 DIAGNOSIS — M659 Synovitis and tenosynovitis, unspecified: Secondary | ICD-10-CM | POA: Diagnosis present

## 2016-11-03 DIAGNOSIS — Z8249 Family history of ischemic heart disease and other diseases of the circulatory system: Secondary | ICD-10-CM | POA: Diagnosis not present

## 2016-11-03 DIAGNOSIS — F39 Unspecified mood [affective] disorder: Secondary | ICD-10-CM | POA: Diagnosis present

## 2016-11-03 DIAGNOSIS — E860 Dehydration: Secondary | ICD-10-CM | POA: Diagnosis present

## 2016-11-03 DIAGNOSIS — Z791 Long term (current) use of non-steroidal anti-inflammatories (NSAID): Secondary | ICD-10-CM | POA: Diagnosis not present

## 2016-11-03 DIAGNOSIS — L02416 Cutaneous abscess of left lower limb: Secondary | ICD-10-CM | POA: Diagnosis not present

## 2016-11-03 DIAGNOSIS — F1910 Other psychoactive substance abuse, uncomplicated: Secondary | ICD-10-CM | POA: Diagnosis not present

## 2016-11-03 DIAGNOSIS — B182 Chronic viral hepatitis C: Secondary | ICD-10-CM | POA: Diagnosis present

## 2016-11-03 DIAGNOSIS — N179 Acute kidney failure, unspecified: Secondary | ICD-10-CM | POA: Diagnosis not present

## 2016-11-03 DIAGNOSIS — L02612 Cutaneous abscess of left foot: Secondary | ICD-10-CM | POA: Diagnosis present

## 2016-11-03 DIAGNOSIS — M009 Pyogenic arthritis, unspecified: Secondary | ICD-10-CM | POA: Diagnosis present

## 2016-11-03 DIAGNOSIS — I10 Essential (primary) hypertension: Secondary | ICD-10-CM | POA: Diagnosis present

## 2016-11-03 DIAGNOSIS — J449 Chronic obstructive pulmonary disease, unspecified: Secondary | ICD-10-CM | POA: Diagnosis present

## 2016-11-03 DIAGNOSIS — M65872 Other synovitis and tenosynovitis, left ankle and foot: Secondary | ICD-10-CM | POA: Diagnosis present

## 2016-11-03 DIAGNOSIS — L02419 Cutaneous abscess of limb, unspecified: Secondary | ICD-10-CM | POA: Diagnosis not present

## 2016-11-03 DIAGNOSIS — Z8619 Personal history of other infectious and parasitic diseases: Secondary | ICD-10-CM | POA: Diagnosis not present

## 2016-11-03 DIAGNOSIS — Z888 Allergy status to other drugs, medicaments and biological substances status: Secondary | ICD-10-CM

## 2016-11-03 DIAGNOSIS — Z5181 Encounter for therapeutic drug level monitoring: Secondary | ICD-10-CM | POA: Diagnosis not present

## 2016-11-03 DIAGNOSIS — Z9689 Presence of other specified functional implants: Secondary | ICD-10-CM | POA: Diagnosis not present

## 2016-11-03 DIAGNOSIS — M65972 Unspecified synovitis and tenosynovitis, left ankle and foot: Secondary | ICD-10-CM | POA: Diagnosis present

## 2016-11-03 DIAGNOSIS — M25472 Effusion, left ankle: Secondary | ICD-10-CM | POA: Diagnosis not present

## 2016-11-03 DIAGNOSIS — F329 Major depressive disorder, single episode, unspecified: Secondary | ICD-10-CM | POA: Diagnosis not present

## 2016-11-03 DIAGNOSIS — D72829 Elevated white blood cell count, unspecified: Secondary | ICD-10-CM | POA: Diagnosis not present

## 2016-11-03 DIAGNOSIS — Z7982 Long term (current) use of aspirin: Secondary | ICD-10-CM

## 2016-11-03 DIAGNOSIS — Z881 Allergy status to other antibiotic agents status: Secondary | ICD-10-CM | POA: Diagnosis not present

## 2016-11-03 DIAGNOSIS — B9561 Methicillin susceptible Staphylococcus aureus infection as the cause of diseases classified elsewhere: Secondary | ICD-10-CM | POA: Diagnosis present

## 2016-11-03 DIAGNOSIS — M7989 Other specified soft tissue disorders: Secondary | ICD-10-CM | POA: Diagnosis not present

## 2016-11-03 DIAGNOSIS — Z79899 Other long term (current) drug therapy: Secondary | ICD-10-CM

## 2016-11-03 DIAGNOSIS — M109 Gout, unspecified: Secondary | ICD-10-CM | POA: Diagnosis present

## 2016-11-03 DIAGNOSIS — F17200 Nicotine dependence, unspecified, uncomplicated: Secondary | ICD-10-CM | POA: Diagnosis present

## 2016-11-03 DIAGNOSIS — B954 Other streptococcus as the cause of diseases classified elsewhere: Secondary | ICD-10-CM | POA: Diagnosis not present

## 2016-11-03 DIAGNOSIS — L0291 Cutaneous abscess, unspecified: Secondary | ICD-10-CM | POA: Diagnosis not present

## 2016-11-03 DIAGNOSIS — R825 Elevated urine levels of drugs, medicaments and biological substances: Secondary | ICD-10-CM | POA: Diagnosis present

## 2016-11-03 DIAGNOSIS — L03116 Cellulitis of left lower limb: Secondary | ICD-10-CM | POA: Diagnosis present

## 2016-11-03 DIAGNOSIS — F1990 Other psychoactive substance use, unspecified, uncomplicated: Secondary | ICD-10-CM | POA: Diagnosis not present

## 2016-11-03 DIAGNOSIS — Z885 Allergy status to narcotic agent status: Secondary | ICD-10-CM | POA: Diagnosis not present

## 2016-11-03 HISTORY — DX: Chronic obstructive pulmonary disease, unspecified: J44.9

## 2016-11-03 HISTORY — DX: Unspecified viral hepatitis C without hepatic coma: B19.20

## 2016-11-03 HISTORY — PX: I&D EXTREMITY: SHX5045

## 2016-11-03 HISTORY — DX: Other cervical disc displacement, unspecified cervical region: M50.20

## 2016-11-03 HISTORY — DX: Unspecified osteoarthritis, unspecified site: M19.90

## 2016-11-03 LAB — BASIC METABOLIC PANEL
ANION GAP: 9 (ref 5–15)
BUN: 49 mg/dL — ABNORMAL HIGH (ref 6–20)
CALCIUM: 8.9 mg/dL (ref 8.9–10.3)
CO2: 22 mmol/L (ref 22–32)
CREATININE: 1.66 mg/dL — AB (ref 0.61–1.24)
Chloride: 102 mmol/L (ref 101–111)
GFR calc Af Amer: 49 mL/min — ABNORMAL LOW (ref >60)
GFR calc non Af Amer: 42 mL/min — ABNORMAL LOW (ref >60)
GLUCOSE: 127 mg/dL — AB (ref 65–99)
Potassium: 3.6 mmol/L (ref 3.5–5.1)
Sodium: 133 mmol/L — ABNORMAL LOW (ref 135–145)

## 2016-11-03 LAB — CBC WITH DIFFERENTIAL/PLATELET
BASOS ABS: 0 10*3/uL (ref 0.0–0.1)
Basophils Relative: 0 %
Eosinophils Absolute: 0 10*3/uL (ref 0.0–0.7)
Eosinophils Relative: 0 %
HEMATOCRIT: 40.1 % (ref 39.0–52.0)
HEMOGLOBIN: 13.5 g/dL (ref 13.0–17.0)
LYMPHS PCT: 4 %
Lymphs Abs: 1 10*3/uL (ref 0.7–4.0)
MCH: 26.9 pg (ref 26.0–34.0)
MCHC: 33.7 g/dL (ref 30.0–36.0)
MCV: 80 fL (ref 78.0–100.0)
Monocytes Absolute: 2.5 10*3/uL — ABNORMAL HIGH (ref 0.1–1.0)
Monocytes Relative: 10 %
NEUTROS ABS: 21.8 10*3/uL — AB (ref 1.7–7.7)
NEUTROS PCT: 86 %
Platelets: 284 10*3/uL (ref 150–400)
RBC: 5.01 MIL/uL (ref 4.22–5.81)
RDW: 14.2 % (ref 11.5–15.5)
WBC: 25.3 10*3/uL — AB (ref 4.0–10.5)

## 2016-11-03 LAB — URINALYSIS, ROUTINE W REFLEX MICROSCOPIC
BILIRUBIN URINE: NEGATIVE
Glucose, UA: NEGATIVE mg/dL
Hgb urine dipstick: NEGATIVE
Ketones, ur: NEGATIVE mg/dL
Leukocytes, UA: NEGATIVE
NITRITE: NEGATIVE
PROTEIN: 30 mg/dL — AB
Specific Gravity, Urine: 1.021 (ref 1.005–1.030)
pH: 5 (ref 5.0–8.0)

## 2016-11-03 LAB — RAPID URINE DRUG SCREEN, HOSP PERFORMED
AMPHETAMINES: NOT DETECTED
Barbiturates: NOT DETECTED
Benzodiazepines: NOT DETECTED
COCAINE: POSITIVE — AB
OPIATES: POSITIVE — AB
TETRAHYDROCANNABINOL: POSITIVE — AB

## 2016-11-03 LAB — URINALYSIS, MICROSCOPIC (REFLEX)

## 2016-11-03 LAB — PROTIME-INR
INR: 1.02
Prothrombin Time: 13.4 seconds (ref 11.4–15.2)

## 2016-11-03 LAB — I-STAT CG4 LACTIC ACID, ED: LACTIC ACID, VENOUS: 1.84 mmol/L (ref 0.5–1.9)

## 2016-11-03 SURGERY — IRRIGATION AND DEBRIDEMENT EXTREMITY
Anesthesia: General | Site: Foot | Laterality: Left

## 2016-11-03 MED ORDER — KETAMINE HCL-SODIUM CHLORIDE 100-0.9 MG/10ML-% IV SOSY
PREFILLED_SYRINGE | INTRAVENOUS | Status: AC
  Filled 2016-11-03: qty 10

## 2016-11-03 MED ORDER — DEXAMETHASONE SODIUM PHOSPHATE 10 MG/ML IJ SOLN
INTRAMUSCULAR | Status: DC | PRN
  Administered 2016-11-03: 10 mg via INTRAVENOUS

## 2016-11-03 MED ORDER — PIPERACILLIN-TAZOBACTAM 3.375 G IVPB
3.3750 g | Freq: Three times a day (TID) | INTRAVENOUS | Status: DC
  Administered 2016-11-04 (×2): 3.375 g via INTRAVENOUS
  Filled 2016-11-03 (×5): qty 50

## 2016-11-03 MED ORDER — LACTATED RINGERS IV SOLN
INTRAVENOUS | Status: DC | PRN
  Administered 2016-11-03 (×2): via INTRAVENOUS

## 2016-11-03 MED ORDER — KETAMINE HCL 10 MG/ML IJ SOLN
INTRAMUSCULAR | Status: DC | PRN
  Administered 2016-11-03: 50 mg via INTRAVENOUS

## 2016-11-03 MED ORDER — VANCOMYCIN HCL IN DEXTROSE 1-5 GM/200ML-% IV SOLN
1000.0000 mg | Freq: Once | INTRAVENOUS | Status: AC
  Administered 2016-11-03: 1000 mg via INTRAVENOUS
  Filled 2016-11-03: qty 200

## 2016-11-03 MED ORDER — SUCCINYLCHOLINE CHLORIDE 200 MG/10ML IV SOSY
PREFILLED_SYRINGE | INTRAVENOUS | Status: AC
  Filled 2016-11-03: qty 10

## 2016-11-03 MED ORDER — SODIUM CHLORIDE 0.9 % IJ SOLN
INTRAMUSCULAR | Status: AC
  Filled 2016-11-03: qty 10

## 2016-11-03 MED ORDER — SODIUM CHLORIDE 0.9 % IR SOLN
Status: DC | PRN
  Administered 2016-11-03: 6000 mL

## 2016-11-03 MED ORDER — AMLODIPINE BESYLATE 10 MG PO TABS
10.0000 mg | ORAL_TABLET | Freq: Every morning | ORAL | Status: DC
  Administered 2016-11-04 – 2016-11-09 (×6): 10 mg via ORAL
  Filled 2016-11-03 (×6): qty 1

## 2016-11-03 MED ORDER — PROPOFOL 10 MG/ML IV BOLUS
INTRAVENOUS | Status: AC
  Filled 2016-11-03: qty 20

## 2016-11-03 MED ORDER — TAMSULOSIN HCL 0.4 MG PO CAPS
0.4000 mg | ORAL_CAPSULE | Freq: Every day | ORAL | Status: DC
  Administered 2016-11-04 – 2016-11-08 (×5): 0.4 mg via ORAL
  Filled 2016-11-03 (×5): qty 1

## 2016-11-03 MED ORDER — SODIUM CHLORIDE 0.9 % IV SOLN
INTRAVENOUS | Status: AC
  Administered 2016-11-04: 02:00:00 via INTRAVENOUS

## 2016-11-03 MED ORDER — MEPERIDINE HCL 25 MG/ML IJ SOLN: 6.2500 mg | INTRAMUSCULAR | Status: DC | PRN

## 2016-11-03 MED ORDER — SUFENTANIL CITRATE 50 MCG/ML IV SOLN
INTRAVENOUS | Status: AC
  Filled 2016-11-03: qty 1

## 2016-11-03 MED ORDER — MORPHINE SULFATE (PF) 2 MG/ML IV SOLN
1.0000 mg | INTRAVENOUS | Status: DC | PRN
  Administered 2016-11-03: 1 mg via INTRAVENOUS
  Filled 2016-11-03: qty 1

## 2016-11-03 MED ORDER — ALBUTEROL SULFATE (2.5 MG/3ML) 0.083% IN NEBU: 3.0000 mL | INHALATION_SOLUTION | Freq: Four times a day (QID) | RESPIRATORY_TRACT | Status: DC | PRN

## 2016-11-03 MED ORDER — ACETAMINOPHEN 325 MG PO TABS
650.0000 mg | ORAL_TABLET | Freq: Four times a day (QID) | ORAL | Status: DC | PRN
  Administered 2016-11-07 (×2): 650 mg via ORAL
  Filled 2016-11-03 (×2): qty 2

## 2016-11-03 MED ORDER — ONDANSETRON HCL 4 MG/2ML IJ SOLN
INTRAMUSCULAR | Status: DC | PRN
  Administered 2016-11-03: 4 mg via INTRAVENOUS

## 2016-11-03 MED ORDER — ATENOLOL 50 MG PO TABS
25.0000 mg | ORAL_TABLET | Freq: Every day | ORAL | Status: DC
  Administered 2016-11-04 – 2016-11-08 (×5): 25 mg via ORAL
  Filled 2016-11-03 (×6): qty 1

## 2016-11-03 MED ORDER — LAMOTRIGINE 100 MG PO TABS
200.0000 mg | ORAL_TABLET | Freq: Every morning | ORAL | Status: DC
  Administered 2016-11-04 – 2016-11-09 (×6): 200 mg via ORAL
  Filled 2016-11-03 (×6): qty 2

## 2016-11-03 MED ORDER — SODIUM CHLORIDE 0.9 % IV SOLN
1500.0000 mg | Freq: Once | INTRAVENOUS | Status: DC
  Filled 2016-11-03: qty 1500

## 2016-11-03 MED ORDER — SUCCINYLCHOLINE 20MG/ML (10ML) SYRINGE FOR MEDFUSION PUMP - OPTIME
INTRAMUSCULAR | Status: DC | PRN
  Administered 2016-11-03: 120 mg via INTRAVENOUS

## 2016-11-03 MED ORDER — PROPOFOL 10 MG/ML IV BOLUS
INTRAVENOUS | Status: DC | PRN
  Administered 2016-11-03: 130 mg via INTRAVENOUS
  Administered 2016-11-03: 70 mg via INTRAVENOUS

## 2016-11-03 MED ORDER — SODIUM CHLORIDE 0.9 % IV SOLN
1000.0000 mL | INTRAVENOUS | Status: DC
  Administered 2016-11-03: 1000 mL via INTRAVENOUS

## 2016-11-03 MED ORDER — PIPERACILLIN-TAZOBACTAM 3.375 G IVPB
INTRAVENOUS | Status: AC
  Administered 2016-11-03: 3.375 g
  Filled 2016-11-03: qty 50

## 2016-11-03 MED ORDER — HYDROMORPHONE HCL 1 MG/ML IJ SOLN
1.0000 mg | Freq: Once | INTRAMUSCULAR | Status: AC
  Administered 2016-11-03: 1 mg via INTRAVENOUS
  Filled 2016-11-03: qty 1

## 2016-11-03 MED ORDER — ASPIRIN EC 81 MG PO TBEC
81.0000 mg | DELAYED_RELEASE_TABLET | Freq: Every day | ORAL | Status: DC
  Administered 2016-11-04 – 2016-11-09 (×6): 81 mg via ORAL
  Filled 2016-11-03 (×6): qty 1

## 2016-11-03 MED ORDER — SUFENTANIL CITRATE 50 MCG/ML IV SOLN
INTRAVENOUS | Status: DC | PRN
  Administered 2016-11-03: 25 ug via INTRAVENOUS
  Administered 2016-11-03: 15 ug via INTRAVENOUS
  Administered 2016-11-03: 10 ug via INTRAVENOUS

## 2016-11-03 MED ORDER — VANCOMYCIN HCL IN DEXTROSE 1-5 GM/200ML-% IV SOLN
INTRAVENOUS | Status: AC
  Filled 2016-11-03: qty 200

## 2016-11-03 MED ORDER — ACETAMINOPHEN 650 MG RE SUPP: 650.0000 mg | Freq: Four times a day (QID) | RECTAL | Status: DC | PRN

## 2016-11-03 MED ORDER — ENOXAPARIN SODIUM 40 MG/0.4ML ~~LOC~~ SOLN
40.0000 mg | SUBCUTANEOUS | Status: DC
  Administered 2016-11-04 – 2016-11-08 (×5): 40 mg via SUBCUTANEOUS
  Filled 2016-11-03 (×5): qty 0.4

## 2016-11-03 MED ORDER — SODIUM CHLORIDE 0.9 % IV SOLN
500.0000 mg | Freq: Two times a day (BID) | INTRAVENOUS | Status: DC
  Administered 2016-11-04: 500 mg via INTRAVENOUS
  Filled 2016-11-03 (×2): qty 500

## 2016-11-03 MED ORDER — VANCOMYCIN HCL 1000 MG IV SOLR
INTRAVENOUS | Status: DC | PRN
  Administered 2016-11-03: 1000 mg via INTRAVENOUS

## 2016-11-03 MED ORDER — MIDAZOLAM HCL 2 MG/2ML IJ SOLN
INTRAMUSCULAR | Status: AC
  Filled 2016-11-03: qty 2

## 2016-11-03 MED ORDER — HYDROMORPHONE HCL 1 MG/ML IJ SOLN
1.0000 mg | Freq: Once | INTRAMUSCULAR | Status: AC
Start: 2016-11-03 — End: 2016-11-03
  Administered 2016-11-03: 1 mg via INTRAVENOUS
  Filled 2016-11-03: qty 1

## 2016-11-03 MED ORDER — HYDRALAZINE HCL 20 MG/ML IJ SOLN
10.0000 mg | Freq: Four times a day (QID) | INTRAMUSCULAR | Status: DC | PRN
  Filled 2016-11-03: qty 1

## 2016-11-03 MED ORDER — SODIUM CHLORIDE 0.9 % IV SOLN
1000.0000 mL | Freq: Once | INTRAVENOUS | Status: AC
  Administered 2016-11-03: 1000 mL via INTRAVENOUS

## 2016-11-03 MED ORDER — LORAZEPAM 2 MG/ML IJ SOLN
1.0000 mg | Freq: Once | INTRAMUSCULAR | Status: AC
  Administered 2016-11-03: 1 mg via INTRAVENOUS
  Filled 2016-11-03: qty 1

## 2016-11-03 MED ORDER — ONDANSETRON HCL 4 MG/2ML IJ SOLN: 4.0000 mg | Freq: Once | INTRAMUSCULAR | Status: DC | PRN

## 2016-11-03 MED ORDER — CLONAZEPAM 1 MG PO TABS
1.0000 mg | ORAL_TABLET | Freq: Two times a day (BID) | ORAL | Status: DC
  Administered 2016-11-04 – 2016-11-09 (×11): 1 mg via ORAL
  Filled 2016-11-03 (×12): qty 1

## 2016-11-03 MED ORDER — LIDOCAINE HCL (CARDIAC) 20 MG/ML IV SOLN
INTRAVENOUS | Status: DC | PRN
  Administered 2016-11-03: 100 mg via INTRATRACHEAL

## 2016-11-03 MED ORDER — ARIPIPRAZOLE 5 MG PO TABS
15.0000 mg | ORAL_TABLET | Freq: Every day | ORAL | Status: DC
  Administered 2016-11-04 – 2016-11-09 (×6): 15 mg via ORAL
  Filled 2016-11-03 (×7): qty 3

## 2016-11-03 MED ORDER — FLUOXETINE HCL 20 MG PO CAPS
20.0000 mg | ORAL_CAPSULE | Freq: Every day | ORAL | Status: DC
  Administered 2016-11-04 – 2016-11-09 (×6): 20 mg via ORAL
  Filled 2016-11-03 (×6): qty 1

## 2016-11-03 MED ORDER — LIDOCAINE HCL (PF) 1 % IJ SOLN
5.0000 mL | Freq: Once | INTRAMUSCULAR | Status: AC
  Administered 2016-11-03: 5 mL
  Filled 2016-11-03: qty 5

## 2016-11-03 MED ORDER — ONDANSETRON HCL 4 MG/2ML IJ SOLN
INTRAMUSCULAR | Status: AC
  Filled 2016-11-03: qty 2

## 2016-11-03 MED ORDER — DEXAMETHASONE SODIUM PHOSPHATE 10 MG/ML IJ SOLN
INTRAMUSCULAR | Status: AC
  Filled 2016-11-03: qty 1

## 2016-11-03 MED ORDER — MIDAZOLAM HCL 2 MG/2ML IJ SOLN
INTRAMUSCULAR | Status: DC | PRN
  Administered 2016-11-03: 2 mg via INTRAVENOUS

## 2016-11-03 MED ORDER — LIDOCAINE 2% (20 MG/ML) 5 ML SYRINGE
INTRAMUSCULAR | Status: AC
Start: 2016-11-03 — End: 2016-11-03
  Filled 2016-11-03: qty 5

## 2016-11-03 MED ORDER — PIPERACILLIN-TAZOBACTAM 4.5 G IVPB
4.5000 g | Freq: Once | INTRAVENOUS | Status: DC
  Filled 2016-11-03: qty 100

## 2016-11-03 MED ORDER — HYDROMORPHONE HCL 1 MG/ML IJ SOLN
0.2500 mg | INTRAMUSCULAR | Status: DC | PRN
  Administered 2016-11-03 (×4): 0.5 mg via INTRAVENOUS

## 2016-11-03 SURGICAL SUPPLY — 54 items
BANDAGE ACE 4X5 VEL STRL LF (GAUZE/BANDAGES/DRESSINGS) ×6 IMPLANT
BANDAGE ELASTIC 3 VELCRO ST LF (GAUZE/BANDAGES/DRESSINGS) IMPLANT
BNDG COHESIVE 1X5 TAN STRL LF (GAUZE/BANDAGES/DRESSINGS) IMPLANT
BNDG COHESIVE 4X5 TAN STRL (GAUZE/BANDAGES/DRESSINGS) ×3 IMPLANT
BNDG COHESIVE 6X5 TAN STRL LF (GAUZE/BANDAGES/DRESSINGS) ×6 IMPLANT
BNDG CONFORM 3 STRL LF (GAUZE/BANDAGES/DRESSINGS) IMPLANT
BNDG GAUZE ELAST 4 BULKY (GAUZE/BANDAGES/DRESSINGS) ×6 IMPLANT
BNDG GAUZE STRTCH 6 (GAUZE/BANDAGES/DRESSINGS) ×3 IMPLANT
CONT SPEC 4OZ CLIKSEAL STRL BL (MISCELLANEOUS) ×3 IMPLANT
CORDS BIPOLAR (ELECTRODE) IMPLANT
COVER SURGICAL LIGHT HANDLE (MISCELLANEOUS) ×3 IMPLANT
CUFF TOURNIQUET SINGLE 24IN (TOURNIQUET CUFF) ×3 IMPLANT
CUFF TOURNIQUET SINGLE 34IN LL (TOURNIQUET CUFF) IMPLANT
CUFF TOURNIQUET SINGLE 44IN (TOURNIQUET CUFF) IMPLANT
DRAIN JACKSON PRATT 10MM FLAT (MISCELLANEOUS) ×3 IMPLANT
DRAPE INCISE IOBAN 66X45 STRL (DRAPES) IMPLANT
DRAPE U-SHAPE 47X51 STRL (DRAPES) ×3 IMPLANT
DRSG PAD ABDOMINAL 8X10 ST (GAUZE/BANDAGES/DRESSINGS) ×6 IMPLANT
DURAPREP 26ML APPLICATOR (WOUND CARE) ×3 IMPLANT
ELECT REM PT RETURN 9FT ADLT (ELECTROSURGICAL)
ELECTRODE REM PT RTRN 9FT ADLT (ELECTROSURGICAL) IMPLANT
EVACUATOR SILICONE 100CC (DRAIN) ×3 IMPLANT
GAUZE SPONGE 4X4 12PLY STRL (GAUZE/BANDAGES/DRESSINGS) ×6 IMPLANT
GAUZE XEROFORM 5X9 LF (GAUZE/BANDAGES/DRESSINGS) ×3 IMPLANT
GLOVE SKINSENSE NS SZ7.5 (GLOVE) ×4
GLOVE SKINSENSE STRL SZ7.5 (GLOVE) ×2 IMPLANT
GOWN STRL REIN XL XLG (GOWN DISPOSABLE) ×6 IMPLANT
HANDPIECE INTERPULSE COAX TIP (DISPOSABLE)
KIT BASIN OR (CUSTOM PROCEDURE TRAY) ×3 IMPLANT
KIT ROOM TURNOVER OR (KITS) ×3 IMPLANT
MANIFOLD NEPTUNE II (INSTRUMENTS) ×3 IMPLANT
PACK ORTHO EXTREMITY (CUSTOM PROCEDURE TRAY) ×3 IMPLANT
PAD ABD 8X10 STRL (GAUZE/BANDAGES/DRESSINGS) ×3 IMPLANT
PAD ARMBOARD 7.5X6 YLW CONV (MISCELLANEOUS) ×6 IMPLANT
PAD CAST 4YDX4 CTTN HI CHSV (CAST SUPPLIES) ×1 IMPLANT
PADDING CAST ABS 4INX4YD NS (CAST SUPPLIES) ×2
PADDING CAST ABS COTTON 4X4 ST (CAST SUPPLIES) ×1 IMPLANT
PADDING CAST COTTON 4X4 STRL (CAST SUPPLIES) ×2
PADDING CAST COTTON 6X4 STRL (CAST SUPPLIES) ×3 IMPLANT
SET HNDPC FAN SPRY TIP SCT (DISPOSABLE) IMPLANT
SPONGE LAP 18X18 X RAY DECT (DISPOSABLE) ×3 IMPLANT
STOCKINETTE IMPERVIOUS 9X36 MD (GAUZE/BANDAGES/DRESSINGS) ×3 IMPLANT
SUT ETHILON 2 0 FS 18 (SUTURE) ×15 IMPLANT
SUT PDS AB 0 CT 36 (SUTURE) ×3 IMPLANT
TOWEL OR 17X24 6PK STRL BLUE (TOWEL DISPOSABLE) ×3 IMPLANT
TOWEL OR 17X26 10 PK STRL BLUE (TOWEL DISPOSABLE) ×3 IMPLANT
TUBE ANAEROBIC SPECIMEN COL (MISCELLANEOUS) IMPLANT
TUBE CONNECTING 12'X1/4 (SUCTIONS) ×1
TUBE CONNECTING 12X1/4 (SUCTIONS) ×2 IMPLANT
TUBE FEEDING 5FR 15 INCH (TUBING) IMPLANT
TUBING CYSTO DISP (UROLOGICAL SUPPLIES) ×3 IMPLANT
UNDERPAD 30X30 (UNDERPADS AND DIAPERS) ×6 IMPLANT
WATER STERILE IRR 1000ML POUR (IV SOLUTION) ×3 IMPLANT
YANKAUER SUCT BULB TIP NO VENT (SUCTIONS) ×3 IMPLANT

## 2016-11-03 NOTE — ED Notes (Signed)
Right Radial Artery x 1 attempt for blood draw. No complications noted. Bleeding stopped.

## 2016-11-03 NOTE — Anesthesia Preprocedure Evaluation (Addendum)
Anesthesia Evaluation  Patient identified by MRN, date of birth, ID band Patient awake    Reviewed: Allergy & Precautions, NPO status , Patient's Chart, lab work & pertinent test results  Airway Mallampati: I  TM Distance: >3 FB Neck ROM: Full    Dental   Pulmonary Current Smoker,    Pulmonary exam normal        Cardiovascular hypertension, Pt. on medications Normal cardiovascular exam     Neuro/Psych Depression    GI/Hepatic (+) Hepatitis -, C  Endo/Other    Renal/GU      Musculoskeletal   Abdominal   Peds  Hematology   Anesthesia Other Findings   Reproductive/Obstetrics                             Anesthesia Physical Anesthesia Plan  ASA: II and emergent  Anesthesia Plan: General   Post-op Pain Management:    Induction: Intravenous, Rapid sequence and Cricoid pressure planned  Airway Management Planned: Oral ETT  Additional Equipment:   Intra-op Plan:   Post-operative Plan: Extubation in OR  Informed Consent: I have reviewed the patients History and Physical, chart, labs and discussed the procedure including the risks, benefits and alternatives for the proposed anesthesia with the patient or authorized representative who has indicated his/her understanding and acceptance.     Plan Discussed with: CRNA and Surgeon  Anesthesia Plan Comments:        Anesthesia Quick Evaluation

## 2016-11-03 NOTE — Anesthesia Postprocedure Evaluation (Signed)
Anesthesia Post Note  Patient: [name]  Procedure(s) Performed: Procedure(s) (LRB): IRRIGATION AND DEBRIDEMENT EXTREMITY (Left)  Patient location during evaluation: PACU Anesthesia Type: General Level of consciousness: awake and alert Pain management: pain level controlled Vital Signs Assessment: post-procedure vital signs reviewed and stable Respiratory status: spontaneous breathing, nonlabored ventilation, respiratory function stable and patient connected to nasal cannula oxygen Cardiovascular status: blood pressure returned to baseline and stable Postop Assessment: no signs of nausea or vomiting Anesthetic complications: no       Last Vitals:  Vitals:   11/03/16 2145 11/03/16 2211  BP: (!) 171/86 (!) 168/77  Pulse:  74  Resp:  15  Temp: 36.7 C 36.8 C    Last Pain:  Vitals:   11/03/16 2211  TempSrc: Oral  PainSc:                  Levante Simones DAVID

## 2016-11-03 NOTE — Anesthesia Procedure Notes (Signed)
Procedure Name: Intubation Date/Time: 11/03/2016 7:49 PM Performed by: Claris Che Pre-anesthesia Checklist: Patient identified, Emergency Drugs available, Suction available, Patient being monitored and Timeout performed Patient Re-evaluated:Patient Re-evaluated prior to inductionOxygen Delivery Method: Circle system utilized Preoxygenation: Pre-oxygenation with 100% oxygen Intubation Type: IV induction, Rapid sequence and Cricoid Pressure applied Ventilation: Mask ventilation without difficulty Laryngoscope Size: Mac and 3 Grade View: Grade II Tube type: Oral Tube size: 7.5 mm Number of attempts: 1 Airway Equipment and Method: Stylet Placement Confirmation: ETT inserted through vocal cords under direct vision,  positive ETCO2 and breath sounds checked- equal and bilateral Secured at: 24 cm Tube secured with: Tape Dental Injury: Teeth and Oropharynx as per pre-operative assessment

## 2016-11-03 NOTE — Transfer of Care (Signed)
Immediate Anesthesia Transfer of Care Note  Patient: Travis Salinas  Procedure(s) Performed: Procedure(s): IRRIGATION AND DEBRIDEMENT EXTREMITY (Left)  Patient Location: PACU  Anesthesia Type:General  Level of Consciousness: oriented, sedated, patient cooperative and responds to stimulation  Airway & Oxygen Therapy: Patient Spontanous Breathing and Patient connected to nasal cannula oxygen  Post-op Assessment: Report given to RN, Post -op Vital signs reviewed and stable and Patient moving all extremities X 4  Post vital signs: Reviewed and stable  Last Vitals:  Vitals:   11/03/16 1715 11/03/16 2106  BP: (!) 145/66 (!) (P) 164/87  Pulse: 67   Resp: 18 (P) 11  Temp: 36.7 C     Last Pain:  Vitals:   11/03/16 1715  TempSrc:   PainSc: 8          Complications: No apparent anesthesia complications

## 2016-11-03 NOTE — Progress Notes (Signed)
Returned eyeglasses to patient on arrival to PACU

## 2016-11-03 NOTE — Op Note (Signed)
   Date of Surgery: 11/03/2016  INDICATIONS: Mr. Fiyinfoluwa [place]inta is a 66 y.o.-year-old male with a left ankle abscess;  The patient did consent to the procedure after discussion of the risks and benefits.  PREOPERATIVE DIAGNOSIS: 1. Left ankle abscess, anterior muscular compartment abscess, infectious tenosynovitis of tibialis anterior and extensor digitorum longus 2. Left ankle effusion  POSTOPERATIVE DIAGNOSIS: Same.  PROCEDURE:  1. Debridement of skin, muscle, tendon, fascia, and bone of left lower leg, 30 x 5 x 5 cm (750 sq cm) 2. Arthrotomy of left ankle joint for exploration of effusion 3. Complex wound repair of left lower leg 30 cm  SURGEON: N. Eduard Roux, M.D.  ASSIST: none.  ANESTHESIA:  general  IV FLUIDS AND URINE: See anesthesia.  ESTIMATED BLOOD LOSS: minimal mL.  IMPLANTS: none  DRAINS: JP [id number] x 1  COMPLICATIONS: None.  DESCRIPTION OF PROCEDURE: The patient was brought to the operating room and placed supine on the operating table.  The patient had been signed prior to the procedure and this was documented. The patient had the anesthesia placed by the anesthesiologist.  A time-out was performed to confirm that this was the correct patient, site, side and location. The patient did receive antibiotics prior to the incision and was re-dosed during the procedure as needed at indicated intervals.  A tourniquet was placed.  The patient had the operative extremity prepped and draped in the standard surgical fashion.    I first extended the surgical incision that was made by the ER doctor both proximally and distally.  There was return of frank pus which was cultured.  Blunt dissection was then performed down to the tibialis anterior tendon.  There was evidence of infectious tenosynovitis of that tendon and it tracked proximally into the anterior muscular compartment.  I kept extending the incision up the lower leg until I found the proximal extent of the abscess.  The extensor  retinaculum was incised to fully decompress the TA and EDL tendons.  The infection was all the way down to the anterior cortex of the tibia.  I then performed sharp excisional debridement of all infected and nonviable skin, muscle, tenosynovium, fascia, and bone with knife and rongeur.  After thorough debridement of the leg wound, I then performed an ankle arthrotomy to evaluate for septic arthritis.  There was just return of ankle synovial fluid but no evidence of infection.  I then thoroughly irrigated the wound with 6 liters of normal saline.  The ankle arthrotomy was then closed with 0 PDS.  I then placed a #10 JP drain in the anterior muscular compartment which was brought out of the skin proximally through a stab incision.  I then performed complex wound repair of the lower leg.  Sterile dressings were applied and the leg was placed in a CAM boot.  He tolerated the procedure well and had no immediate complications.  POSTOPERATIVE PLAN: Patient will need infectious disease consult for management of antibiotics.  He has a severe infection and is at risk for significant soft tissue loss or even amputation.    Azucena Cecil, MD [place] 9:38 PM

## 2016-11-03 NOTE — ED Provider Notes (Signed)
[facility]PT [facility] Provider Note   CSN: [id number] Arrival date & time: 11/03/16  1009     History   Chief Complaint No chief complaint on file.   HPI Travis Salinas is a 66 y.o. male.  HPI Patient began noticing a slight amount of pain in his left ankle 5 days ago. There was no specific injury. He started to develop some redness and some increased discomfort and was seen at urgent care on Thursday. At that time, redness was limited to a small area and out was suspected. A short started on gout therapy of colchicine prednisone and naproxen. Patient has been taking this but swelling and redness got significantly worse yesterday and today. He reports is too painful to bear weight now. Redness has now increased and goes up the front of his leg. He reports the swelling on the front of his ankle has increased. Patient does have history of IV drug abuse. He reports last use was about a week or so ago. He denies injecting in his ankle or foot in the area involved. Past Medical History:  Diagnosis Date  . Hypertension     Patient Active Problem List   Diagnosis Date Noted  . Abscess 11/03/2016  . Acute renal failure (ARF) ([place]) 11/03/2016  . Leukocytosis 11/03/2016  . Liver fibrosis ([place]) 04/24/2016  . Chronic hepatitis C without hepatic coma ([place]) 04/19/2015  . Substance abuse 04/19/2015  . Neck pain, chronic 04/19/2015  . Cellulitis 11/12/2013  . HTN (hypertension) 11/12/2013  . Depression 11/12/2013  . Abscess of calf, right 11/12/2013    Past Surgical History:  Procedure Laterality Date  . INCISION AND DRAINAGE ABSCESS Right 11/12/2013   Procedure: INCISION AND DRAINAGE AND OPEN PACKING OF RIGHT CALF  ABSCESS;  Surgeon: Earnstine Regal, MD;  Location: [facility];  Service: General;  Laterality: Right;       Home Medications    Prior to Admission medications   Medication Sig Start Date End Date Taking? Authorizing Provider  amLODipine (NORVASC) 10 MG tablet Take 10 mg  by mouth every morning.    Historical Provider, MD  ARIPiprazole (ABILIFY) 15 MG tablet Take 15 mg by mouth daily. 10/12/15   Historical Provider, MD  aspirin EC 81 MG tablet Take 1 tablet (81 mg total) by mouth daily. 09/17/16   Jerline Pain, MD  atenolol (TENORMIN) 25 MG tablet Take 25 mg by mouth at bedtime.    Historical Provider, MD  clonazePAM (KLONOPIN) 1 MG tablet Take 1 mg by mouth 2 (two) times daily.    Historical Provider, MD  colchicine 0.6 MG tablet Take 2 tabs (1.2mg ) followed in 1 hour by 1 tab (0.6mg ). 1 tab (0.6) daily for 2 days 11/01/16   Noland Fordyce, PA-C  FLUoxetine (PROZAC) 20 MG capsule Take 20 mg by mouth daily. 10/12/15   Historical Provider, MD  HYDROcodone-acetaminophen (NORCO/VICODIN) 5-325 MG tablet Take 1-2 tablets by mouth every 6 (six) hours as needed for severe pain. 11/01/16   Noland Fordyce, PA-C  lamoTRIgine (LAMICTAL) 200 MG tablet Take 200 mg by mouth every morning.    Historical Provider, MD  MAVYRET 100-40 MG TABS Take 1 tablet by mouth 3 (three) times daily. 08/14/16   Historical Provider, MD  naproxen (NAPROSYN) 375 MG tablet Take 1 tablet (375 mg total) by mouth 2 (two) times daily with a meal. 11/01/16   Noland Fordyce, PA-C  naproxen sodium (ANAPROX) 220 MG tablet Take 440 mg by mouth as needed (headache/  pain).    Historical Provider, MD  oxyCODONE (OXYCONTIN) 40 mg 12 hr tablet Take 30 mg by mouth 2 (two) times daily as needed (pain).  11/16/15   Historical Provider, MD  predniSONE (DELTASONE) 20 MG tablet 3 tabs po day one, then 2 po daily x 4 days 11/01/16   Noland Fordyce, PA-C  PROAIR HFA 108 [phone] Base) MCG/ACT inhaler Inhale 2 puffs into the lungs every 6 (six) hours as needed for wheezing or shortness of breath.  11/17/15   Historical Provider, MD  RAPAFLO 8 MG CAPS capsule Take 8 mg by mouth daily. 11/20/15   Historical Provider, MD    Family History No family history on file.  Social History Social History  Substance Use Topics  . Smoking status:  Current Some Day Smoker    Packs/day: 0.50    Years: 25.00    Types: Cigarettes  . Smokeless tobacco: Never Used  . Alcohol use No     Allergies   Ciprofloxacin and Depakote [divalproex sodium]   Review of Systems Review of Systems  10 Systems reviewed and are negative for acute change except as noted in the HPI.  Physical Exam Updated Vital Signs BP 163/73 (BP Location: Right Arm)   Pulse 94   Temp 97.9 F (36.6 C) (Oral)   Resp 18   Ht 5\' 10"  (1.778 m)   Wt 147 lb (66.7 kg)   SpO2 100%   BMI 21.09 kg/m   Physical Exam  Constitutional: He is oriented to person, place, and time. He appears well-developed and well-nourished. No distress.  HENT:  Head: Normocephalic and atraumatic.  Eyes: Conjunctivae and EOM are normal.  Neck: Neck supple.  Cardiovascular: Normal rate, regular rhythm and intact distal pulses.   No murmur heard. Pulmonary/Chest: Effort normal and breath sounds normal. No respiratory distress.  Abdominal: Soft. There is no tenderness.  Musculoskeletal: He exhibits edema and tenderness.  Patient has moderate to large effusion of the left ankle. Erythema diffusely surrounds the ankle and the forefoot. Erythema also extends up to the mid shin. The anterior ankle has increased swelling with fluctuance of an approximately 4 x 4 cm area. No eschar or blister present at this time.  Neurological: He is alert and oriented to person, place, and time. No cranial nerve deficit. He exhibits normal muscle tone. Coordination normal.  Skin: Skin is warm and dry.  Psychiatric: He has a normal mood and affect.  Nursing note and vitals reviewed.    ED Treatments / Results  Labs (all labs ordered are listed, but only abnormal results are displayed) Labs Reviewed  CBC WITH DIFFERENTIAL/PLATELET - Abnormal; Notable for the following:       Result Value   WBC 25.3 (*)    Neutro Abs 21.8 (*)    Monocytes Absolute 2.5 (*)    All other components within normal limits    BASIC METABOLIC PANEL - Abnormal; Notable for the following:    Sodium 133 (*)    Glucose, Bld 127 (*)    BUN 49 (*)    Creatinine, Ser 1.66 (*)    GFR calc non Af Amer 42 (*)    GFR calc Af Amer 49 (*)    All other components within normal limits  CULTURE, BLOOD (ROUTINE X 2)  CULTURE, BLOOD (ROUTINE X 2)  AEROBIC CULTURE (SUPERFICIAL SPECIMEN)  AEROBIC/ANAEROBIC CULTURE (SURGICAL/DEEP WOUND)  PROTIME-INR  URINALYSIS, ROUTINE W REFLEX MICROSCOPIC  RAPID URINE DRUG SCREEN, HOSP PERFORMED  I-STAT CG4 LACTIC ACID,  ED    EKG  EKG Interpretation None       Radiology Dg Ankle Complete Left  Result Date: 11/03/2016 CLINICAL DATA:  Redness, warmth, and swelling of Left ankle. Left ankle pain is aching and burning, worse with ambulation. No recent injury. EXAM: LEFT ANKLE COMPLETE - 3+ VIEW COMPARISON:  None. FINDINGS: No fracture.  No bone lesion. The ankle mortise is normally spaced and aligned. No arthropathic change. There is soft tissue swelling which is most evident anteriorly. No soft tissue air. IMPRESSION: 1. No fracture, bone lesion or ankle joint abnormality. 2. Diffuse soft tissue swelling which predominates anteriorly. Electronically Signed   By: Lajean Manes M.D.   On: 11/03/2016 11:28    Procedures .Marland KitchenIncision and Drainage Date/Time: 11/03/2016 12:56 PM Performed by: Charlesetta Shanks Authorized by: Charlesetta Shanks   Consent:    Consent obtained:  Verbal   Consent given by:  Patient Location:    Type:  Abscess   Size:  4   Location:  Lower extremity   Lower extremity location:  Ankle   Ankle location:  L ankle Pre-procedure details:    Skin preparation:  Betadine Anesthesia (see MAR for exact dosages):    Anesthesia method:  Local infiltration   Local anesthetic:  Lidocaine 1% w/o epi Procedure type:    Complexity:  Simple Procedure details:    Needle aspiration: yes     Needle size:  18 G   Incision types:  Single straight   Scalpel blade:  11    Drainage:  Purulent   Drainage amount:  Copious   Wound treatment:  Wound left open   Packing materials:  None Post-procedure details:    Patient tolerance of procedure:  Tolerated well, no immediate complications Comments:     Sterile prep of the ankle. First an 18-gauge needle was used to aspirate site of fluctuance on anterior ankle. Copious purulent material was present. I then proceeded to do a single skin incision which allowed copious purulent drainage. Pus was also manually expressed. Covered and dressed.   (including critical care time)  Medications Ordered in ED Medications  lidocaine (PF) (XYLOCAINE) 1 % injection 5 mL (not administered)  piperacillin-tazobactam (ZOSYN) IVPB 4.5 g (not administered)  vancomycin (VANCOCIN) IVPB 1000 mg/200 mL premix (not administered)  HYDROmorphone (DILAUDID) injection 1 mg (not administered)  0.9 %  sodium chloride infusion (not administered)    Followed by  0.9 %  sodium chloride infusion (not administered)    Followed by  0.9 %  sodium chloride infusion (not administered)     Initial Impression / Assessment and Plan / ED Course  I have reviewed the triage vital signs and the nursing notes.  Pertinent labs & imaging results that were available during my care of the patient were reviewed by me and considered in my medical decision making (see chart for details).     Consult: Dr.XU consulted for [facility] orthopedics. Requests that culture from the wound be obtained, and proceed with antibiotic administration. Consult: Dr. Maudie Mercury for admission.  Final Clinical Impressions(s) / ED Diagnoses   Final diagnoses:  Abscess of ankle  Cellulitis of left lower extremity  IVDU (intravenous drug user)   Patient has large diffuse swelling and erythema of the ankle as well as the lower leg and foot. There is a focal, large abscess on the anterior aspect of the ankle. This was incised and copious purulent drainage expressed. The concern is however  for possible septic joint as well. Erythema and  swelling encompass all of the ankle and patient reports severe pain with weightbearing. IV antibiotics initiated and culture obtained per consultation with Dr. Erlinda Hong. Patient will be admitted to hospitalist service. New Prescriptions New Prescriptions   No medications on file     Charlesetta Shanks, MD 11/03/16 1304

## 2016-11-03 NOTE — H&P (Addendum)
TRH H&P   Patient Demographics:    Travis Salinas, is a 66 y.o. male  MRN: [id number]   DOB - [birth year]  Admit Date - 11/03/2016  Outpatient Primary MD for the patient is [name], MD  Referring MD/NP/PA: Charna Elizabeth   Outpatient Specialists:  Dr. Candee Furbish (cardiology), Festus Aloe (urology), Dr. Albertine Patricia (psychiatrist)  Patient coming from: home => [facility]  No chief complaint on file.  abscess left foot    HPI:    Travis Salinas  is a 66 y.o. male,  w hypertension, heroine, coccaine abuse, who presents w complaint of left foot pain since this past Wednesday,  Pt denies redness at that time.  Pt states pain was worse and therefore seen in ER on Friday and tx with prednisone and colchicine for gout attack.  Pt states redness might have begun on Thursday and denies any needle or injection.  Pt presented to [place] today due to increase in pain as well as redness.    In ED, pt noted to have abscess on the dorsum of the left foot, I and D performed.  ED apparently spoke with orthopedics who will consult on the case.  Pt given vanco and zosyn iv in ED, and cultures were obtained. Xray negative.  Pt will be admitted for cellulitis and abscess of the left foot.     Review of systems:    In addition to the HPI above,  No Fever-chills, No Headache, No changes with Vision or hearing, No problems swallowing food or Liquids, No Chest pain, Cough or Shortness of Breath, No Abdominal pain, No Nausea or Vommitting, Bowel movements are regular, No Blood in stool or Urine, No dysuria, No new skin rashes or bruises,  No new weakness, tingling, numbness in any extremity, No recent weight gain or loss, No polyuria, polydypsia or polyphagia, No significant Mental Stressors.  A full 10 point Review of Systems was done, except as stated above,  all other Review of Systems were negative.   With Past History of the following :    Past Medical History:  Diagnosis Date  . Arthritis   . Cervical disc herniation   . COPD (chronic obstructive pulmonary disease) ([place])   . Hepatitis C   . Hypertension       Past Surgical History:  Procedure Laterality Date  . INCISION AND DRAINAGE ABSCESS Right 11/12/2013   Procedure: INCISION AND DRAINAGE AND OPEN PACKING OF RIGHT CALF  ABSCESS;  Surgeon: Earnstine Regal, MD;  Location: [facility];  Service: General;  Laterality: Right;  . KNEE SURGERY        Social History:     Social History  Substance Use Topics  . Smoking status: Current Some Day Smoker    Packs/day: 0.50    Years: 25.00    Types: Cigarettes  . Smokeless  tobacco: Never Used  . Alcohol use No     Lives - at home  Mobility - walks typically without difficulty   Family History :     Family History  Problem Relation Age of Onset  . Heart disease Mother   . Stroke Mother   . Heart disease Father   . Stroke Father       Home Medications:   Prior to Admission medications   Medication Sig Start Date End Date Taking? Authorizing Provider  amLODipine (NORVASC) 10 MG tablet Take 10 mg by mouth every morning.    Historical Provider, MD  ARIPiprazole (ABILIFY) 15 MG tablet Take 15 mg by mouth daily. 10/12/15   Historical Provider, MD  aspirin EC 81 MG tablet Take 1 tablet (81 mg total) by mouth daily. 09/17/16   Jerline Pain, MD  atenolol (TENORMIN) 25 MG tablet Take 25 mg by mouth at bedtime.    Historical Provider, MD  clonazePAM (KLONOPIN) 1 MG tablet Take 1 mg by mouth 2 (two) times daily.    Historical Provider, MD  colchicine 0.6 MG tablet Take 2 tabs (1.72m) followed in 1 hour by 1 tab (0.664m. 1 tab (0.6) daily for 2 days 11/01/16   ErNoland FordycePA-C  FLUoxetine (PROZAC) 20 MG capsule Take 20 mg by mouth daily. 10/12/15   Historical Provider, MD  HYDROcodone-acetaminophen (NORCO/VICODIN) 5-325 MG tablet Take 1-2  tablets by mouth every 6 (six) hours as needed for severe pain. 11/01/16   ErNoland FordycePA-C  lamoTRIgine (LAMICTAL) 200 MG tablet Take 200 mg by mouth every morning.    Historical Provider, MD  MAVYRET 100-40 MG TABS Take 1 tablet by mouth 3 (three) times daily. 08/14/16   Historical Provider, MD  naproxen (NAPROSYN) 375 MG tablet Take 1 tablet (375 mg total) by mouth 2 (two) times daily with a meal. 11/01/16   ErNoland FordycePA-C  naproxen sodium (ANAPROX) 220 MG tablet Take 440 mg by mouth as needed (headache/ pain).    Historical Provider, MD  oxyCODONE (OXYCONTIN) 40 mg 12 hr tablet Take 30 mg by mouth 2 (two) times daily as needed (pain).  11/16/15   Historical Provider, MD  predniSONE (DELTASONE) 20 MG tablet 3 tabs po day one, then 2 po daily x 4 days 11/01/16   ErNoland FordycePA-C  PROAIR HFA 108 ([phone]) MCG/ACT inhaler Inhale 2 puffs into the lungs every 6 (six) hours as needed for wheezing or shortness of breath.  11/17/15   Historical Provider, MD  RAPAFLO 8 MG CAPS capsule Take 8 mg by mouth daily. 11/20/15   Historical Provider, MD     Allergies:     Allergies  Allergen Reactions  . Propoxyphene Nausea And Vomiting  . Ciprofloxacin Nausea Only  . Depakote [Divalproex Sodium] Other (See Comments)    hallucinations     Physical Exam:   Vitals  Blood pressure (!) 145/66, pulse 67, temperature 98.1 F (36.7 C), resp. rate 18, height _0  (1.778 m), weight 66.7 kg (147 lb), SpO2 99 %.   1. General   lying in bed in NAD,    2. Normal affect and insight, Not Suicidal or Homicidal, Awake Alert, Oriented X 3.  3. No F.N deficits, ALL C.Nerves Intact, Strength 5/5 all 4 extremities, Sensation intact all 4 extremities, Plantars down going.  4. Ears and Eyes appear Normal, Conjunctivae clear, PERRLA. Moist Oral Mucosa.  5. Supple Neck, No JVD, No cervical lymphadenopathy appriciated, No Carotid Bruits.  6.  Symmetrical Chest wall movement, Good air movement bilaterally,  CTAB.  7. RRR, No Gallops, Rubs or Murmurs, No Parasternal Heave.  8. Positive Bowel Sounds, Abdomen Soft, No tenderness, No organomegaly appriciated,No rebound -guarding or rigidity.  9.  No Cyanosis, Normal Skin Turgor, redness extending from the dorum of the left foot up to about 1/4 up to the knee, evidence of prior I and D on the dorsum of the left foot, w slight swelling of the left ankle  10. Good muscle tone,  joints appear normal , no effusions, Normal ROM.  11. No Palpable Lymph Nodes in Neck or Axillae     Data Review:    CBC  Recent Labs Lab 11/03/16 1109  WBC 25.3*  HGB 13.5  HCT 40.1  PLT 284  MCV 80.0  MCH 26.9  MCHC 33.7  RDW 14.2  LYMPHSABS 1.0  MONOABS 2.5*  EOSABS 0.0  BASOSABS 0.0   ------------------------------------------------------------------------------------------------------------------  Chemistries   Recent Labs Lab 11/03/16 1155  NA 133*  K 3.6  CL 102  CO2 22  GLUCOSE 127*  BUN 49*  CREATININE 1.66*  CALCIUM 8.9   ------------------------------------------------------------------------------------------------------------------ estimated creatinine clearance is 42.4 mL/min (by C-G formula based on SCr of 1.66 mg/dL (H)). ------------------------------------------------------------------------------------------------------------------ No results for input(s): TSH, T4TOTAL, T3FREE, THYROIDAB in the last 72 hours.  Invalid input(s): FREET3  Coagulation profile  Recent Labs Lab 11/03/16 1155  INR 1.02   ------------------------------------------------------------------------------------------------------------------- No results for input(s): DDIMER in the last 72 hours. -------------------------------------------------------------------------------------------------------------------  Cardiac Enzymes No results for input(s): CKMB, TROPONINI, MYOGLOBIN in the last 168 hours.  Invalid input(s):  CK ------------------------------------------------------------------------------------------------------------------ No results found for: BNP   ---------------------------------------------------------------------------------------------------------------  Urinalysis    Component Value Date/Time   COLORURINE YELLOW 11/03/2016 1309   APPEARANCEUR CLOUDY (A) 11/03/2016 1309   LABSPEC 1.021 11/03/2016 1309   PHURINE 5.0 11/03/2016 1309   GLUCOSEU NEGATIVE 11/03/2016 1309   HGBUR NEGATIVE 11/03/2016 1309   BILIRUBINUR NEGATIVE 11/03/2016 1309   KETONESUR NEGATIVE 11/03/2016 1309   PROTEINUR 30 (A) 11/03/2016 1309   UROBILINOGEN 1.0 11/14/2013 1424   NITRITE NEGATIVE 11/03/2016 1309   LEUKOCYTESUR NEGATIVE 11/03/2016 1309    ----------------------------------------------------------------------------------------------------------------   Imaging Results:    Dg Ankle Complete Left  Result Date: 11/03/2016 CLINICAL DATA:  Redness, warmth, and swelling of Left ankle. Left ankle pain is aching and burning, worse with ambulation. No recent injury. EXAM: LEFT ANKLE COMPLETE - 3+ VIEW COMPARISON:  None. FINDINGS: No fracture.  No bone lesion. The ankle mortise is normally spaced and aligned. No arthropathic change. There is soft tissue swelling which is most evident anteriorly. No soft tissue air. IMPRESSION: 1. No fracture, bone lesion or ankle joint abnormality. 2. Diffuse soft tissue swelling which predominates anteriorly. Electronically Signed   By: Lajean Manes M.D.   On: 11/03/2016 11:28       Assessment & Plan:    Principal Problem:   Abscess Active Problems:   HTN (hypertension)   Acute renal failure (ARF) (HCC)   Leukocytosis    1. Abscess L foot/ Cellulitis Wound culture pending Blood culture pending Check esr vanco iv, zosyn iv pharmacy to dose Orthopedic consult appreciated  2. Leukocytosis secondary to #1 Iv abx as above  3. ARF Likely secondary to  dehydration Check renal ultrasound Check urine sodium, urine creatinine, urine eosinophils Dc NSAIDS Hydrate with NS iv  4. Hypertension Cont amlodipine Cont atenolol  5. Hep C Cont Mavyret  6. Mood disorder unspecified Cont lamictal, cont abilify, cont  fluoxetine  7. Gout Cont colchicine, check uric acid   DVT Prophylaxis Lovenox - SCDs  AM Labs Ordered, also please review Full Orders  Family Communication: Admission, patients condition and plan of care including tests being ordered have been discussed with the patient  who indicate understanding and agree with the plan and Code Status.  Code Status FULL CODE  Likely DC to  home  Condition GUARDED    Consults called: orthopedics by ED Dr. Miachel Roux  Admission status: inpatient  Time spent in minutes : 45 minutes   Jani Gravel M.D on 11/03/2016 at 7:21 PM  Between 7am to 7pm - Pager - [phone]. After 7pm go to [email or web address] - password [facility]  [facility] - Office  [phone]

## 2016-11-03 NOTE — Consult Note (Signed)
ORTHOPAEDIC CONSULTATION  REQUESTING PHYSICIAN: Travis Gravel, MD;Travis Salinas  Chief Complaint: Left ankle swelling  HPI: Travis Salinas is a 66 y.o. male who presents with left ankle Salinas, swelling, redness, difficulty weight bearing for about 3-4 days.  Was originally treated as gout but did not respond to steroids.  He presented to the ER and bedside gram stain shows abundant GPC, GNR and GNC.  He also had frank pus coming out of the wound.  He does have a h/o heroin use but his last use was 3 weeks ago.  Ortho consulted.  Past Medical History:  Diagnosis Date  . Arthritis   . Cervical disc herniation   . COPD (chronic obstructive pulmonary disease) ([place])   . Hepatitis C   . Hypertension    Past Surgical History:  Procedure Laterality Date  . INCISION AND DRAINAGE ABSCESS Right 11/12/2013   Procedure: INCISION AND DRAINAGE AND OPEN PACKING OF RIGHT CALF  ABSCESS;  Surgeon: Travis Regal, MD;  Location: [facility];  Service: General;  Laterality: Right;  . KNEE SURGERY     Social History   Social History  . Marital status: Married    Spouse name: N/A  . Number of children: N/A  . Years of education: N/A   Social History Main Topics  . Smoking status: Current Some Day Smoker    Packs/day: 0.50    Years: 25.00    Types: Cigarettes  . Smokeless tobacco: Never Used  . Alcohol use No  . Drug use: Yes    Types: Heroin     Comment:     . Sexual activity: Not Asked   Other Topics Concern  . None   Social History Narrative  . None   Family History  Problem Relation Age of Onset  . Heart disease Mother   . Stroke Mother   . Heart disease Father   . Stroke Father    - negative except otherwise stated in the family history section Allergies  Allergen Reactions  . Propoxyphene Nausea And Vomiting  . Ciprofloxacin Nausea Only  . Depakote [Divalproex Sodium] Other (See Comments)    hallucinations   Prior to Admission medications   Medication Sig Start Date End Date  Taking? Authorizing Provider  amLODipine (NORVASC) 10 MG tablet Take 10 mg by mouth every morning.    Historical Provider, MD  ARIPiprazole (ABILIFY) 15 MG tablet Take 15 mg by mouth daily. 10/12/15   Historical Provider, MD  aspirin EC 81 MG tablet Take 1 tablet (81 mg total) by mouth daily. 09/17/16   Travis Pain, MD  atenolol (TENORMIN) 25 MG tablet Take 25 mg by mouth at bedtime.    Historical Provider, MD  clonazePAM (KLONOPIN) 1 MG tablet Take 1 mg by mouth 2 (two) times daily.    Historical Provider, MD  colchicine 0.6 MG tablet Take 2 tabs (1.2mg ) followed in 1 hour by 1 tab (0.6mg ). 1 tab (0.6) daily for 2 days 11/01/16   Travis Fordyce, PA-C  FLUoxetine (PROZAC) 20 MG capsule Take 20 mg by mouth daily. 10/12/15   Historical Provider, MD  HYDROcodone-acetaminophen (NORCO/VICODIN) 5-325 MG tablet Take 1-2 tablets by mouth every 6 (six) hours as needed for severe Salinas. 11/01/16   Travis Fordyce, PA-C  lamoTRIgine (LAMICTAL) 200 MG tablet Take 200 mg by mouth every morning.    Historical Provider, MD  MAVYRET 100-40 MG TABS Take 1 tablet by mouth 3 (three) times daily. 08/14/16   Historical Provider, MD  naproxen (NAPROSYN) 375 MG tablet Take 1 tablet (375 mg total) by mouth 2 (two) times daily with a meal. 11/01/16   Travis Fordyce, PA-C  naproxen sodium (ANAPROX) 220 MG tablet Take 440 mg by mouth as needed (headache/ Salinas).    Historical Provider, MD  oxyCODONE (OXYCONTIN) 40 mg 12 hr tablet Take 30 mg by mouth 2 (two) times daily as needed (Salinas).  11/16/15   Historical Provider, MD  predniSONE (DELTASONE) 20 MG tablet 3 tabs po day one, then 2 po daily x 4 days 11/01/16   Travis Fordyce, PA-C  PROAIR HFA 108 [phone]) MCG/ACT inhaler Inhale 2 puffs into the lungs every 6 (six) hours as needed for wheezing or shortness of breath.  11/17/15   Historical Provider, MD  RAPAFLO 8 MG CAPS capsule Take 8 mg by mouth daily. 11/20/15   Historical Provider, MD   Dg Ankle Complete Left  Result Date:  11/03/2016 CLINICAL DATA:  Redness, warmth, and swelling of Left ankle. Left ankle Salinas is aching and burning, worse with ambulation. No recent injury. EXAM: LEFT ANKLE COMPLETE - 3+ VIEW COMPARISON:  None. FINDINGS: No fracture.  No bone lesion. The ankle mortise is normally spaced and aligned. No arthropathic change. There is soft tissue swelling which is most evident anteriorly. No soft tissue air. IMPRESSION: 1. No fracture, bone lesion or ankle joint abnormality. 2. Diffuse soft tissue swelling which predominates anteriorly. Electronically Signed   By: Lajean Manes M.D.   On: 11/03/2016 11:28   - pertinent xrays, CT, MRI studies were reviewed and independently interpreted  Positive ROS: All other systems have been reviewed and were otherwise negative with the exception of those mentioned in the HPI and as above.  Physical Exam: General: Alert, no acute distress Cardiovascular: No pedal edema Respiratory: No cyanosis, no use of accessory musculature GI: No organomegaly, abdomen is soft and non-tender Skin: No lesions in the area of chief complaint Neurologic: Sensation intact distally Psychiatric: Patient is competent for consent with normal mood and affect Lymphatic: No axillary or cervical lymphadenopathy  MUSCULOSKELETAL:  - cellulitis, fluctuance, frank pus of left ankle and anterior shin - painful ankle ROM - strong pulses  Assessment: Left ankle abscess, possible septic arthritis  Plan: - needs formal I&D in OR for evacuation of abscess and exploration of ankle joint - consent obtained  Thank you for the consult and the opportunity to see [name]. Travis Roux, MD [place] 7:20 PM

## 2016-11-03 NOTE — Progress Notes (Signed)
Pharmacy Antibiotic Note  Travis Salinas Travis Salinas is a 66 y.o. male admitted on 11/03/2016 with cellulitis.  Pharmacy has been consulted for vancomycin and Zosyn dosing.  Plan: Vancomycin 1500mg  once followed by 500mg   IV every 12 hours.  Goal trough 15-20 mcg/mL. Zosyn 3.375g IV every 8 hours F/u cultures and LOT  Height: 5\' 10"  (177.8 cm) Weight: 147 lb (66.7 kg) IBW/kg (Calculated) : 73  Temp (24hrs), Avg:98 F (36.7 C), Min:97.9 F (36.6 C), Max:98.1 F (36.7 C)   Recent Labs Lab 11/03/16 1109 11/03/16 1127 11/03/16 1155  WBC 25.3*  --   --   CREATININE  --   --  1.66*  LATICACIDVEN  --  1.84  --     Estimated Creatinine Clearance: 42.4 mL/min (by C-G formula based on SCr of 1.66 mg/dL (H)).    Allergies  Allergen Reactions  . Propoxyphene Nausea And Vomiting  . Ciprofloxacin Nausea Only  . Depakote [Divalproex Sodium] Other (See Comments)    hallucinations    Thank you for allowing pharmacy to be a part of this patient's care.  Travis Salinas 11/03/2016 6:58 PM

## 2016-11-04 ENCOUNTER — Encounter (HOSPITAL_COMMUNITY): Payer: Self-pay | Admitting: Orthopaedic Surgery

## 2016-11-04 ENCOUNTER — Inpatient Hospital Stay (HOSPITAL_COMMUNITY): Payer: 59

## 2016-11-04 DIAGNOSIS — L0291 Cutaneous abscess, unspecified: Secondary | ICD-10-CM

## 2016-11-04 DIAGNOSIS — L03116 Cellulitis of left lower limb: Secondary | ICD-10-CM

## 2016-11-04 DIAGNOSIS — M659 Synovitis and tenosynovitis, unspecified: Secondary | ICD-10-CM

## 2016-11-04 LAB — COMPREHENSIVE METABOLIC PANEL
ALK PHOS: 69 U/L (ref 38–126)
ALT: 15 U/L — AB (ref 17–63)
AST: 25 U/L (ref 15–41)
Albumin: 2.8 g/dL — ABNORMAL LOW (ref 3.5–5.0)
Anion gap: 8 (ref 5–15)
BILIRUBIN TOTAL: 0.7 mg/dL (ref 0.3–1.2)
BUN: 30 mg/dL — ABNORMAL HIGH (ref 6–20)
CALCIUM: 8.4 mg/dL — AB (ref 8.9–10.3)
CO2: 23 mmol/L (ref 22–32)
CREATININE: 1.11 mg/dL (ref 0.61–1.24)
Chloride: 106 mmol/L (ref 101–111)
Glucose, Bld: 162 mg/dL — ABNORMAL HIGH (ref 65–99)
Potassium: 4.1 mmol/L (ref 3.5–5.1)
Sodium: 137 mmol/L (ref 135–145)
TOTAL PROTEIN: 5.9 g/dL — AB (ref 6.5–8.1)

## 2016-11-04 LAB — CBC
HCT: 34.3 % — ABNORMAL LOW (ref 39.0–52.0)
Hemoglobin: 11.1 g/dL — ABNORMAL LOW (ref 13.0–17.0)
MCH: 26.6 pg (ref 26.0–34.0)
MCHC: 32.4 g/dL (ref 30.0–36.0)
MCV: 82.3 fL (ref 78.0–100.0)
PLATELETS: 308 10*3/uL (ref 150–400)
RBC: 4.17 MIL/uL — ABNORMAL LOW (ref 4.22–5.81)
RDW: 14.5 % (ref 11.5–15.5)
WBC: 23.5 10*3/uL — AB (ref 4.0–10.5)

## 2016-11-04 LAB — SEDIMENTATION RATE: SED RATE: 46 mm/h — AB (ref 0–16)

## 2016-11-04 LAB — HIV ANTIBODY (ROUTINE TESTING W REFLEX): HIV Screen 4th Generation wRfx: NONREACTIVE

## 2016-11-04 MED ORDER — VANCOMYCIN HCL IN DEXTROSE 1-5 GM/200ML-% IV SOLN
1000.0000 mg | Freq: Two times a day (BID) | INTRAVENOUS | Status: DC
  Administered 2016-11-04 – 2016-11-06 (×4): 1000 mg via INTRAVENOUS
  Filled 2016-11-04 (×5): qty 200

## 2016-11-04 MED ORDER — PIPERACILLIN-TAZOBACTAM 3.375 G IVPB
3.3750 g | Freq: Three times a day (TID) | INTRAVENOUS | Status: DC
  Administered 2016-11-04 – 2016-11-06 (×6): 3.375 g via INTRAVENOUS
  Filled 2016-11-04 (×7): qty 50

## 2016-11-04 MED ORDER — MORPHINE SULFATE (PF) 2 MG/ML IV SOLN
2.0000 mg | INTRAVENOUS | Status: DC | PRN
  Administered 2016-11-04 (×3): 2 mg via INTRAVENOUS
  Filled 2016-11-04 (×3): qty 1

## 2016-11-04 MED ORDER — VANCOMYCIN HCL 500 MG IV SOLR
500.0000 mg | INTRAVENOUS | Status: AC
  Administered 2016-11-04: 500 mg via INTRAVENOUS
  Filled 2016-11-04: qty 500

## 2016-11-04 MED ORDER — KETOROLAC TROMETHAMINE 30 MG/ML IJ SOLN
30.0000 mg | Freq: Four times a day (QID) | INTRAMUSCULAR | Status: DC
  Administered 2016-11-04 – 2016-11-06 (×10): 30 mg via INTRAVENOUS
  Filled 2016-11-04 (×10): qty 1

## 2016-11-04 MED ORDER — OXYCODONE-ACETAMINOPHEN 5-325 MG PO TABS
1.0000 | ORAL_TABLET | ORAL | Status: DC | PRN
  Administered 2016-11-04 – 2016-11-09 (×24): 1 via ORAL
  Filled 2016-11-04 (×24): qty 1

## 2016-11-04 MED ORDER — HYDROCODONE-ACETAMINOPHEN 5-325 MG PO TABS: 1.0000 | ORAL_TABLET | Freq: Four times a day (QID) | ORAL | Status: DC | PRN

## 2016-11-04 MED ORDER — METHOCARBAMOL 500 MG PO TABS
500.0000 mg | ORAL_TABLET | Freq: Three times a day (TID) | ORAL | Status: DC
  Administered 2016-11-04 – 2016-11-09 (×16): 500 mg via ORAL
  Filled 2016-11-04 (×16): qty 1

## 2016-11-04 MED ORDER — OXYCODONE-ACETAMINOPHEN 5-325 MG PO TABS
1.0000 | ORAL_TABLET | Freq: Three times a day (TID) | ORAL | Status: DC | PRN
  Administered 2016-11-04: 1 via ORAL
  Filled 2016-11-04: qty 1

## 2016-11-04 MED ORDER — MORPHINE SULFATE (PF) 4 MG/ML IV SOLN
4.0000 mg | Freq: Once | INTRAVENOUS | Status: AC
  Administered 2016-11-04: 4 mg via INTRAVENOUS
  Filled 2016-11-04: qty 1

## 2016-11-04 NOTE — Progress Notes (Signed)
Orthopedic Tech Progress Note Patient Details:  Travis Salinas 1951/02/26 [id number]  Ortho Devices Type of Ortho Device: CAM walker Ortho Device/Splint Location: dropped off to OR Ortho Device/Splint Interventions: Renard Matter 11/04/2016, 12:20 AM

## 2016-11-04 NOTE — Progress Notes (Signed)
[facility] Progress Note  Patient: Travis Salinas [id number]   PCP: [name], MD DOB: 04-Jul-1951   DOA: 11/03/2016   DOS: 11/04/2016   Date of Service: the patient was seen and examined on 11/04/2016   Subjective: Feeling better, pain still present and not controlled with current regimen. Nausea and vomiting. No diarrhea  Brief hospital course: Pt. with PMH of hypertension, polysubstance abuse with heroin and cocaine; admitted on 11/03/2016, with complaint of left ankle pain and swelling, was found to have cellulitis with left ankle abscess. S/P debridement of the left lower leg, arthrotomy. Currently has a JP drain Currently further plan is continue JP drain for 2-3 days per surgery.  Assessment and Plan: 1. Left ankle cellulitis with abscess. S/P debridement of the left lower leg and arthrotomy, currently has a JP drain. Orthopedics currently following, recommends to continue JP drain for next 2-3 days. Probably polymicrobial infection based on the Gram stain. Cultures are pending. Continue vancomycin and Zosyn. For pain management placing the patient on scheduled Toradol, scheduled Robaxin. Patient was on IV morphine I would discontinue it and place him on oral Percocet. Do not escalate pain regimen  2. Essential hypertension. Continuing home regimen.  3. Mood disorder. Continuing home regimen of Klonopin, Prozac, Abilify, Lamictal.  4. IV drug abuse. UDS is positive for opiates, cocaine, cannabis use. counseled for quitting drug abuse due to severe infection at present. Social worker consulted.  Bowel regimen: last BM 11/04/2016 Diet: Cardiac diet DVT Prophylaxis: subcutaneous Heparin  Advance goals of care discussion: Full code  Family Communication: no family was present at bedside, at the time of interview.  Disposition:  Discharge to home probably with home health 02/29/2018.  Consultants: Orthopedics Procedures: Incision and  debridement  Antibiotics: Anti-infectives    Start     Dose/Rate Route Frequency Ordered Stop   11/04/16 2300  vancomycin (VANCOCIN) IVPB 1000 mg/200 mL premix     1,000 mg 200 mL/hr over 60 Minutes Intravenous Every 12 hours 11/04/16 1010     11/04/16 1015  vancomycin (VANCOCIN) 500 mg in sodium chloride 0.9 % 100 mL IVPB     500 mg 100 mL/hr over 60 Minutes Intravenous STAT 11/04/16 1010 11/04/16 1225   11/04/16 0700  vancomycin (VANCOCIN) 500 mg in sodium chloride 0.9 % 100 mL IVPB  Status:  Discontinued     500 mg 100 mL/hr over 60 Minutes Intravenous Every 12 hours 11/03/16 1857 11/04/16 1010   11/03/16 1938  vancomycin (VANCOCIN) 1-5 GM/200ML-% IVPB    Comments:  Valda Lamb   : cabinet override      11/03/16 1938 11/04/16 0744   11/03/16 1900  piperacillin-tazobactam (ZOSYN) IVPB 3.375 g     3.375 g 12.5 mL/hr over 240 Minutes Intravenous Every 8 hours 11/03/16 1857     11/03/16 1900  vancomycin (VANCOCIN) 1,500 mg in sodium chloride 0.9 % 500 mL IVPB  Status:  Discontinued     1,500 mg 250 mL/hr over 120 Minutes Intravenous  Once 11/03/16 1858 11/04/16 1010   11/03/16 1320  piperacillin-tazobactam (ZOSYN) 3.375 GM/50ML IVPB    Comments:  Modena Morrow   : cabinet override      11/03/16 1320 11/03/16 1509   11/03/16 1245  piperacillin-tazobactam (ZOSYN) IVPB 4.5 g  Status:  Discontinued     4.5 g 200 mL/hr over 30 Minutes Intravenous  Once 11/03/16 1235 11/03/16 1355   11/03/16 1245  vancomycin (VANCOCIN) IVPB 1000 mg/200 mL premix  1,000 mg 200 mL/hr over 60 Minutes Intravenous  Once 11/03/16 1235 11/03/16 1600        Objective: Physical Exam: Vitals:   11/03/16 2145 11/03/16 2211 November 14, 2016 0552 2016-11-14 1500  BP: (!) 171/86 (!) 168/77 129/65 (!) 154/66  Pulse:  74 65 66  Resp:  15 16 16   Temp: 98.1 F (36.7 C) 98.2 F (36.8 C) 97.5 F (36.4 C) 98.1 F (36.7 C)  TempSrc:  Oral Oral Oral  SpO2:  95% 94% 95%  Weight:   70.2 kg (154 lb 11.2 oz)   Height:         Intake/Output Summary (Last 24 hours) at 14-Nov-2016 1721 Last data filed at 2016-11-14 1500  Gross per 24 hour  Intake          2413.75 ml  Output             1582 ml  Net           831.75 ml   Filed Weights   11/03/16 1023 11-14-2016 0552  Weight: 66.7 kg (147 lb) 70.2 kg (154 lb 11.2 oz)    General: Alert, Awake and Oriented to Time, Place and Person. Appear in mild distress, affect appropriate Eyes: PERRL, Conjunctiva normal ENT: Oral Mucosa clear moist. Neck: no JVD, no Abnormal Mass Or lumps Cardiovascular: S1 and S2 Present, no Murmur, Respiratory: Bilateral Air entry equal and Decreased, no use of accessory muscle, Clear to Auscultation, no Crackles, no wheezes Abdomen: Bowel Sound present, Soft and no tenderness Skin: no redness, no Rash, no induration Extremities: Left leg in brace, no Pedal edema, no calf tenderness Neurologic: Grossly no focal neuro deficit. Bilaterally Equal motor strength  Data Reviewed: CBC:  Recent Labs Lab 11/03/16 1109 2016-11-14 0217  WBC 25.3* 23.5*  NEUTROABS 21.8*  --   HGB 13.5 11.1*  HCT 40.1 34.3*  MCV 80.0 82.3  PLT 284 [id number]   Basic Metabolic Panel:  Recent Labs Lab 11/03/16 1155 2016-11-14 0217  NA 133* 137  K 3.6 4.1  CL 102 106  CO2 22 23  GLUCOSE 127* 162*  BUN 49* 30*  CREATININE 1.66* 1.11  CALCIUM 8.9 8.4*    Liver Function Tests:  Recent Labs Lab 11-14-2016 0217  AST 25  ALT 15*  ALKPHOS 69  BILITOT 0.7  PROT 5.9*  ALBUMIN 2.8*   No results for input(s): LIPASE, AMYLASE in the last 168 hours. No results for input(s): AMMONIA in the last 168 hours. Coagulation Profile:  Recent Labs Lab 11/03/16 1155  INR 1.02   Cardiac Enzymes: No results for input(s): CKTOTAL, CKMB, CKMBINDEX, TROPONINI in the last 168 hours. BNP (last 3 results) No results for input(s): PROBNP in the last 8760 hours.  CBG: No results for input(s): GLUCAP in the last 168 hours.  Studies: US Renal  Result Date:  November 14, 2016 CLINICAL DATA:  Acute renal failure. EXAM: RENAL / URINARY TRACT ULTRASOUND COMPLETE COMPARISON:  None. FINDINGS: Right Kidney: Length: 12.4 cm. Echogenicity within normal limits. No mass or hydronephrosis visualized. Left Kidney: Length: 11.7 cm. Echogenicity within normal limits. Mild thinning of renal parenchyma No mass or hydronephrosis visualized. Bladder: Appears normal for degree of bladder distention. Both ureteral jets are visualized. IMPRESSION: 1. Mild left renal parenchymal thinning. 2. Otherwise unremarkable renal ultrasound. No hydronephrosis. Electronically Signed   By: Jeb Levering M.D.   On: 11-14-2016 00:35     Scheduled Meds: . amLODipine  10 mg Oral q morning - 10a  . ARIPiprazole  15  mg Oral Daily  . aspirin EC  81 mg Oral Daily  . atenolol  25 mg Oral QHS  . clonazePAM  1 mg Oral BID  . enoxaparin (LOVENOX) injection  40 mg Subcutaneous Q24H  . FLUoxetine  20 mg Oral Daily  . ketorolac  30 mg Intravenous Q6H  . lamoTRIgine  200 mg Oral q morning - 10a  . methocarbamol  500 mg Oral TID  . piperacillin-tazobactam (ZOSYN)  IV  3.375 g Intravenous Q8H  . tamsulosin  0.4 mg Oral QPC supper  . vancomycin  1,000 mg Intravenous Q12H   Continuous Infusions: . sodium chloride Stopped (11/04/16 1336)   PRN Meds: acetaminophen **OR** acetaminophen, albuterol, hydrALAZINE, oxyCODONE-acetaminophen  Time spent: 30 minutes  Author: Berle Mull, MD [facility] Pager: [phone] 11/04/2016 5:21 PM  If 7PM-7AM, please contact night-coverage at [email or web address], password [facility]

## 2016-11-04 NOTE — Progress Notes (Signed)
   Subjective:  Patient reports pain as mild.    Objective:   VITALS:   Vitals:   11/03/16 2130 11/03/16 2145 11/03/16 2211 11/04/16 0552  BP: (!) 162/86 (!) 171/86 (!) 168/77 129/65  Pulse:   74 65  Resp:   15 16  Temp:  98.1 F (36.7 C) 98.2 F (36.8 C) 97.5 F (36.4 C)  TempSrc:   Oral Oral  SpO2:   95% 94%  Weight:    154 lb 11.2 oz (70.2 kg)  Height:        Neurologically intact Neurovascular intact Sensation intact distally Intact pulses distally Dorsiflexion/Plantar flexion intact Incision: dressing C/D/I and no drainage No cellulitis present Compartment soft   Lab Results  Component Value Date   WBC 23.5 (H) 11/04/2016   HGB 11.1 (L) 11/04/2016   HCT 34.3 (L) 11/04/2016   MCV 82.3 11/04/2016   PLT 308 11/04/2016     Assessment/Plan:  1 Day Post-Op   - Expected postop acute blood loss anemia - will monitor for symptoms - Up with PT/OT - continue abx - cultures pending - will keep JP drain in for 2-3 days to watch the drainage - stressed the importance of drug cessation  Eduard Roux 11/04/2016, 7:22 AM [phone]

## 2016-11-04 NOTE — Progress Notes (Signed)
Pharmacy Antibiotic Note  Travis Salinas Travis Salinas is a 66 y.o. male admitted on 11/03/2016 with cellulitis, then diagnosed with abscess and infectious tenosynovitis of tibialis/digitorum.  S/p I&D on 11/03/16.  Pharmacy has been consulted for vancomycin and Zosyn dosing.  Renal U/S negative for hydronephrosis and patient's renal function has improved.   Plan: - Change vanc to 1gm IV Q12H - Continue Zosyn 3.375g IV Q8H, 4 hr infusion - Monitor renal fxn, micro data, vanc trough at Css   Height: 5\' 10"  (177.8 cm) Weight: 154 lb 11.2 oz (70.2 kg) IBW/kg (Calculated) : 73  Temp (24hrs), Avg:97.9 F (36.6 C), Min:97.5 F (36.4 C), Max:98.2 F (36.8 C)   Recent Labs Lab 11/03/16 1109 11/03/16 1127 11/03/16 1155 11/04/16 0217  WBC 25.3*  --   --  23.5*  CREATININE  --   --  1.66* 1.11  LATICACIDVEN  --  1.84  --   --     Estimated Creatinine Clearance: 66.8 mL/min (by C-G formula based on SCr of 1.11 mg/dL).    Allergies  Allergen Reactions  . Propoxyphene Nausea And Vomiting  . Ciprofloxacin Nausea Only  . Depakote [Divalproex Sodium] Other (See Comments)    hallucinations    Vanc 2/25 >> Zosyn 2/25 >>  2/25 BCx x2 - 2/25 left ankle - GPC, GN coccobacilli, GNR on Gram stain 2/25 left ankle tissue - 2/25 left ankle abscess - GPC on Gram stain   Curry Dulski D. Mina Marble, PharmD, BCPS Pager:  [phone] 11/04/2016, 10:08 AM

## 2016-11-05 LAB — CBC
HEMATOCRIT: 34 % — AB (ref 39.0–52.0)
Hemoglobin: 10.8 g/dL — ABNORMAL LOW (ref 13.0–17.0)
MCH: 26 pg (ref 26.0–34.0)
MCHC: 31.8 g/dL (ref 30.0–36.0)
MCV: 81.7 fL (ref 78.0–100.0)
Platelets: 323 10*3/uL (ref 150–400)
RBC: 4.16 MIL/uL — AB (ref 4.22–5.81)
RDW: 14.1 % (ref 11.5–15.5)
WBC: 12.7 10*3/uL — AB (ref 4.0–10.5)

## 2016-11-05 LAB — BASIC METABOLIC PANEL
ANION GAP: 8 (ref 5–15)
BUN: 18 mg/dL (ref 6–20)
CALCIUM: 8.2 mg/dL — AB (ref 8.9–10.3)
CO2: 25 mmol/L (ref 22–32)
Chloride: 106 mmol/L (ref 101–111)
Creatinine, Ser: 1.05 mg/dL (ref 0.61–1.24)
Glucose, Bld: 92 mg/dL (ref 65–99)
POTASSIUM: 3.5 mmol/L (ref 3.5–5.1)
Sodium: 139 mmol/L (ref 135–145)

## 2016-11-05 MED ORDER — ZOLPIDEM TARTRATE 5 MG PO TABS
5.0000 mg | ORAL_TABLET | Freq: Every evening | ORAL | Status: DC | PRN
  Administered 2016-11-05 – 2016-11-08 (×4): 5 mg via ORAL
  Filled 2016-11-05 (×4): qty 1

## 2016-11-05 NOTE — Progress Notes (Signed)
   Subjective:  Patient reports pain as severe at times.  Objective:   VITALS:   Vitals:   11/05/16 0417 11/05/16 0652 11/05/16 0800 11/05/16 1012  BP: 128/67  (!) 147/74 (!) 152/66  Pulse: 62  64 (!) 55  Resp: 20  20 16   Temp: 97.7 F (36.5 C)  98.1 F (36.7 C) 98.1 F (36.7 C)  TempSrc: Oral  Oral Oral  SpO2: 94%  95% 99%  Weight:  146 lb 6.4 oz (66.4 kg)    Height:        Neurologically intact Neurovascular intact Sensation intact distally Intact pulses distally Dorsiflexion/Plantar flexion intact Incision: dressing C/D/I and no drainage No cellulitis present Compartment soft   Lab Results  Component Value Date   WBC 12.7 (H) 11/05/2016   HGB 10.8 (L) 11/05/2016   HCT 34.0 (L) 11/05/2016   MCV 81.7 11/05/2016   PLT 323 11/05/2016     Assessment/Plan:  2 Days Post-Op   - WBCs has improved. - continue JP drain, following output, serosanguinous drainage - anticipate removing drain tomorrow. - staph and strep on cultures  Eduard Roux 11/05/2016, 12:56 PM [phone]

## 2016-11-05 NOTE — Progress Notes (Signed)
[facility] Progress Note  Patient: Travis Salinas   PCP: [name], MD DOB: 09/21/1950   DOA: 11/03/2016   DOS: 11/05/2016   Date of Service: the patient was seen and examined on 11/05/2016   Subjective: Feeling better, seen walking out in hallway with railing support and lead back to room.  Brief hospital course: Pt. with PMH of hypertension, polysubstance abuse with heroin and cocaine; admitted on 11/03/2016, with complaint of left ankle pain and swelling, was found to have cellulitis with left ankle abscess. S/P debridement of the left lower leg, arthrotomy. Currently has a JP drain Currently further plan is continue JP drain for 2-3 days per surgery.  Assessment and Plan: 1. Left ankle cellulitis with abscess. S/P debridement of the left lower leg and arthrotomy, currently has a JP drain. Staph aureus and group C strep in culture, also possible anaerobe.  Orthopedics currently following, recommends to continue JP drain for today. Probably polymicrobial infection based on the cultures. Continue vancomycin and Zosyn. PT consulted.  For pain management placing the patient on scheduled Toradol, scheduled Robaxin. Do not escalate pain regimen  2. Essential hypertension. Continuing home regimen.  3. Mood disorder. Continuing home regimen of Klonopin, Prozac, Abilify, Lamictal.  4. IV drug abuse. UDS is positive for opiates, cocaine, cannabis use. counseled for quitting drug abuse due to severe infection at present. Social worker consulted.  Bowel regimen: last BM 11/04/2016 Diet: Cardiac diet DVT Prophylaxis: subcutaneous Heparin  Advance goals of care discussion: Full code  Family Communication: no family was present at bedside, at the time of interview.  Disposition:  Discharge to home probably with home health 02/29/2018.  Consultants: Orthopedics Procedures: Incision and debridement  Antibiotics: Anti-infectives    Start     Dose/Rate  Route Frequency Ordered Stop   11/04/16 2300  vancomycin (VANCOCIN) IVPB 1000 mg/200 mL premix     1,000 mg 200 mL/hr over 60 Minutes Intravenous Every 12 hours 11/04/16 1010     11/04/16 2200  piperacillin-tazobactam (ZOSYN) IVPB 3.375 g     3.375 g 12.5 mL/hr over 240 Minutes Intravenous Every 8 hours 11/04/16 1901     11/04/16 1015  vancomycin (VANCOCIN) 500 mg in sodium chloride 0.9 % 100 mL IVPB     500 mg 100 mL/hr over 60 Minutes Intravenous STAT 11/04/16 1010 11/04/16 1225   11/04/16 0700  vancomycin (VANCOCIN) 500 mg in sodium chloride 0.9 % 100 mL IVPB  Status:  Discontinued     500 mg 100 mL/hr over 60 Minutes Intravenous Every 12 hours 11/03/16 1857 11/04/16 1010   11/03/16 1938  vancomycin (VANCOCIN) 1-5 GM/200ML-% IVPB    Comments:  Valda Lamb   : cabinet override      11/03/16 1938 11/04/16 0744   11/03/16 1900  piperacillin-tazobactam (ZOSYN) IVPB 3.375 g  Status:  Discontinued     3.375 g 12.5 mL/hr over 240 Minutes Intravenous Every 8 hours 11/03/16 1857 11/04/16 1901   11/03/16 1900  vancomycin (VANCOCIN) 1,500 mg in sodium chloride 0.9 % 500 mL IVPB  Status:  Discontinued     1,500 mg 250 mL/hr over 120 Minutes Intravenous  Once 11/03/16 1858 11/04/16 1010   11/03/16 1320  piperacillin-tazobactam (ZOSYN) 3.375 GM/50ML IVPB    Comments:  Modena Morrow   : cabinet override      11/03/16 1320 11/03/16 1509   11/03/16 1245  piperacillin-tazobactam (ZOSYN) IVPB 4.5 g  Status:  Discontinued     4.5 g 200 mL/hr  over 30 Minutes Intravenous  Once 11/03/16 1235 11/03/16 1355   11/03/16 1245  vancomycin (VANCOCIN) IVPB 1000 mg/200 mL premix     1,000 mg 200 mL/hr over 60 Minutes Intravenous  Once 11/03/16 1235 11/03/16 1600      Objective: Physical Exam: Vitals:   11/05/16 0652 11/05/16 0800 11/05/16 1012 11/05/16 1420  BP:  (!) 147/74 (!) 152/66 (!) 144/66  Pulse:  64 (!) 55 65  Resp:  20 16 20   Temp:  98.1 F (36.7 C) 98.1 F (36.7 C) 98.2 F (36.8 C)    TempSrc:  Oral Oral Oral  SpO2:  95% 99% 99%  Weight: 66.4 kg (146 lb 6.4 oz)     Height:        Intake/Output Summary (Last 24 hours) at 11/05/16 1449 Last data filed at 11/05/16 1400  Gross per 24 hour  Intake              600 ml  Output             2135 ml  Net            -1535 ml   Filed Weights   11/03/16 1023 11/04/16 0552 11/05/16 0652  Weight: 66.7 kg (147 lb) 70.2 kg (154 lb 11.2 oz) 66.4 kg (146 lb 6.4 oz)    General: Alert, Awake and Oriented to Time, Place and Person. Appear in mild distress, affect appropriate Eyes: PERRL, Conjunctiva normal ENT: Oral Mucosa clear moist. Neck: no JVD, no Abnormal Mass Or lumps Cardiovascular: S1 and S2 Present, no Murmur, Respiratory: Bilateral Air entry equal and Decreased, no use of accessory muscle, Clear to Auscultation, no Crackles, no wheezes Abdomen: Bowel Sound present, Soft and no tenderness Skin: no redness, no Rash, no induration Extremities: Left leg in brace, no Pedal edema, no calf tenderness Neurologic: Grossly no focal neuro deficit. Bilaterally Equal motor strength  Data Reviewed: CBC:  Recent Labs Lab 11/03/16 1109 11/04/16 0217 11/05/16 0604  WBC 25.3* 23.5* 12.7*  NEUTROABS 21.8*  --   --   HGB 13.5 11.1* 10.8*  HCT 40.1 34.3* 34.0*  MCV 80.0 82.3 81.7  PLT 284 308 [id number]   Basic Metabolic Panel:  Recent Labs Lab 11/03/16 1155 11/04/16 0217 11/05/16 0604  NA 133* 137 139  K 3.6 4.1 3.5  CL 102 106 106  CO2 22 23 25   GLUCOSE 127* 162* 92  BUN 49* 30* 18  CREATININE 1.66* 1.11 1.05  CALCIUM 8.9 8.4* 8.2*    Liver Function Tests:  Recent Labs Lab 11/04/16 0217  AST 25  ALT 15*  ALKPHOS 69  BILITOT 0.7  PROT 5.9*  ALBUMIN 2.8*   No results for input(s): LIPASE, AMYLASE in the last 168 hours. No results for input(s): AMMONIA in the last 168 hours. Coagulation Profile:  Recent Labs Lab 11/03/16 1155  INR 1.02   Cardiac Enzymes: No results for input(s): CKTOTAL, CKMB,  CKMBINDEX, TROPONINI in the last 168 hours. BNP (last 3 results) No results for input(s): PROBNP in the last 8760 hours.  CBG: No results for input(s): GLUCAP in the last 168 hours.  Studies: No results found.   Scheduled Meds: . amLODipine  10 mg Oral q morning - 10a  . ARIPiprazole  15 mg Oral Daily  . aspirin EC  81 mg Oral Daily  . atenolol  25 mg Oral QHS  . clonazePAM  1 mg Oral BID  . enoxaparin (LOVENOX) injection  40 mg Subcutaneous Q24H  .  FLUoxetine  20 mg Oral Daily  . ketorolac  30 mg Intravenous Q6H  . lamoTRIgine  200 mg Oral q morning - 10a  . methocarbamol  500 mg Oral TID  . piperacillin-tazobactam (ZOSYN)  IV  3.375 g Intravenous Q8H  . tamsulosin  0.4 mg Oral QPC supper  . vancomycin  1,000 mg Intravenous Q12H   Continuous Infusions:  PRN Meds: acetaminophen **OR** acetaminophen, albuterol, hydrALAZINE, oxyCODONE-acetaminophen  Time spent: 30 minutes  Author: Berle Mull, MD [facility] Pager: [phone] 11/05/2016 2:49 PM  If 7PM-7AM, please contact night-coverage at [email or web address], password [facility]

## 2016-11-06 LAB — CBC
HCT: 37.6 % — ABNORMAL LOW (ref 39.0–52.0)
Hemoglobin: 11.9 g/dL — ABNORMAL LOW (ref 13.0–17.0)
MCH: 25.9 pg — AB (ref 26.0–34.0)
MCHC: 31.6 g/dL (ref 30.0–36.0)
MCV: 81.7 fL (ref 78.0–100.0)
PLATELETS: 392 10*3/uL (ref 150–400)
RBC: 4.6 MIL/uL (ref 4.22–5.81)
RDW: 14 % (ref 11.5–15.5)
WBC: 13.2 10*3/uL — ABNORMAL HIGH (ref 4.0–10.5)

## 2016-11-06 LAB — BASIC METABOLIC PANEL
Anion gap: 9 (ref 5–15)
BUN: 16 mg/dL (ref 6–20)
CHLORIDE: 104 mmol/L (ref 101–111)
CO2: 25 mmol/L (ref 22–32)
CREATININE: 1.13 mg/dL (ref 0.61–1.24)
Calcium: 8.8 mg/dL — ABNORMAL LOW (ref 8.9–10.3)
GFR calc Af Amer: >60 mL/min (ref >60)
GLUCOSE: 103 mg/dL — AB (ref 65–99)
POTASSIUM: 3.4 mmol/L — AB (ref 3.5–5.1)
SODIUM: 138 mmol/L (ref 135–145)

## 2016-11-06 LAB — SODIUM, URINE, RANDOM: SODIUM UR: 125 mmol/L

## 2016-11-06 MED ORDER — LISINOPRIL 5 MG PO TABS
5.0000 mg | ORAL_TABLET | Freq: Every day | ORAL | Status: DC
  Administered 2016-11-06 – 2016-11-09 (×4): 5 mg via ORAL
  Filled 2016-11-06 (×4): qty 1

## 2016-11-06 MED ORDER — KETOROLAC TROMETHAMINE 30 MG/ML IJ SOLN: 30.0000 mg | Freq: Four times a day (QID) | INTRAMUSCULAR | Status: DC | PRN

## 2016-11-06 MED ORDER — POLYETHYLENE GLYCOL 3350 17 G PO PACK: 17.0000 g | PACK | Freq: Every day | ORAL | Status: DC | PRN

## 2016-11-06 MED ORDER — KETOROLAC TROMETHAMINE 30 MG/ML IJ SOLN
30.0000 mg | Freq: Four times a day (QID) | INTRAMUSCULAR | Status: AC
  Administered 2016-11-06 – 2016-11-09 (×12): 30 mg via INTRAVENOUS
  Filled 2016-11-06 (×12): qty 1

## 2016-11-06 MED ORDER — CEFAZOLIN SODIUM-DEXTROSE 2-4 GM/100ML-% IV SOLN
2.0000 g | Freq: Three times a day (TID) | INTRAVENOUS | Status: DC
  Administered 2016-11-06 – 2016-11-09 (×8): 2 g via INTRAVENOUS
  Filled 2016-11-06 (×9): qty 100

## 2016-11-06 NOTE — Consult Note (Signed)
   [facility] CM Inpatient Consult   11/06/2016  Travis Salinas 12-Mar-1951 [id number]    Came to visit [name] on behalf of [place] to Wellness program for [place] employees/dependents with [facility] insurance. He endorses his wife is a Furniture conservator/restorer. Explained Link to [employer]. Provided Link to [employer] along with contact information. Confirmed best contact number as [phone] for post discharge call. Inpatient RNCM aware of visit.   [name], MSN-Ed, RN,BSN [facility]ia[facility] [phone]

## 2016-11-06 NOTE — Evaluation (Signed)
Physical Therapy Evaluation Patient Details Name: Travis Salinas MRN: [id number] DOB: 16-May-1951 Today's Date: 11/06/2016   History of Present Illness  Pt is a 66 yo male admitted through ED for left leg abscess. Pt was being treated for gout initally since 11/01/26 and then returned to ED in [place] and was transferred to [facility] on 11/03/16 where he underwent a debridement of skin, muscle, tendon, fascia and bone, arthrotomy of left ankle and complex wound repair. Pt was found to have leukocytosis, ARF, and cellulitis. PMH significant for HTN, COPD, Hep C, and has a history of IV drug abuse.   Clinical Impression  Pt is POD 3 from the above procedure. Prior to admission, pt was living with a roommate in a single level, first floor apartment and receiving assistance from his ex-wife PRN for transportation to MD appointments and for shopping. Pt is functioning at a supervision to min guard level at this time with a walker. Pt will benefit from continued acute PT services in order to maximize his functional outcomes prior to discharge to recommended venue of care below.     Follow Up Recommendations Home health PT    Equipment Recommendations  None recommended by PT    Recommendations for Other Services       Precautions / Restrictions Precautions Precautions: None Required Braces or Orthoses: Other Brace/Splint Other Brace/Splint: cam walker LLE Restrictions Weight Bearing Restrictions: Yes LLE Weight Bearing: Weight bearing as tolerated      Mobility  Bed Mobility Overal bed mobility: Modified Independent             General bed mobility comments: Able to get OOB without any assistance  Transfers Overall transfer level: Needs assistance Equipment used: Rolling walker (2 wheeled) Transfers: Sit to/from Stand Sit to Stand: Min guard         General transfer comment: Min gaurd for safety from EOB  Ambulation/Gait Ambulation/Gait assistance: Min guard Ambulation  Distance (Feet): 500 Feet Assistive device: Rolling walker (2 wheeled) Gait Pattern/deviations: Step-through pattern;Antalgic Gait velocity: decreased Gait velocity interpretation: Below normal speed for age/gender General Gait Details: Mild antalgic gait with cam walker on LLE. Pain increases to 3/10 with gait.   Stairs            Wheelchair Mobility    Modified Rankin (Stroke Patients Only)       Balance                                             Pertinent Vitals/Pain Pain Assessment: 0-10 Pain Score: 8  Pain Location: right ankle with weight bearing Pain Descriptors / Indicators: Throbbing Pain Intervention(s): Monitored during session;Premedicated before session    [place] expects to be discharged to:: Private residence Living Arrangements: Alone Available Help at Discharge: Family;Available 24 hours/day;Friend(s);Other (Comment) (roomate available to assist) Type of Home: Apartment Home Access: Level entry     Home Layout: One level Home Equipment: Walker - 2 wheels Additional Comments: separated from wife but she is available to help along with a roommate    Prior Function Level of Independence: Independent         Comments: is retired.      Hand Dominance   Dominant Hand: Right    Extremity/Trunk Assessment   Upper Extremity Assessment Upper Extremity Assessment: Overall WFL for tasks assessed    Lower Extremity Assessment  Lower Extremity Assessment: Generalized weakness    Cervical / Trunk Assessment Cervical / Trunk Assessment: Normal  Communication   Communication: No difficulties  Cognition Arousal/Alertness: Awake/alert Behavior During Therapy: WFL for tasks assessed/performed Overall Cognitive Status: Within Functional Limits for tasks assessed                      General Comments      Exercises     Assessment/Plan    PT Assessment Patient needs continued PT services  PT  Problem List Decreased strength;Decreased activity tolerance;Decreased balance;Decreased mobility;Decreased knowledge of use of DME       PT Treatment Interventions DME instruction;Gait training;Stair training;Functional mobility training;Therapeutic activities;Therapeutic exercise;Balance training    PT Goals (Current goals can be found in the Care Plan section)  Acute Rehab PT Goals Patient Stated Goal: to not have pain with walking PT Goal Formulation: With patient Time For Goal Achievement: 11/13/16 Potential to Achieve Goals: Good    Frequency Min 3X/week   Barriers to discharge        Co-evaluation               End of Session Equipment Utilized During Treatment: Gait belt Activity Tolerance: Patient tolerated treatment well Patient left: in chair;with call bell/phone within reach Nurse Communication: Mobility status PT Visit Diagnosis: Difficulty in walking, not elsewhere classified (R26.2);Pain Pain - Right/Left: Left Pain - part of body: Leg         Time: [id number] PT Time Calculation (min) (ACUTE ONLY): 27 min   Charges:   PT Evaluation $PT Eval Low Complexity: 1 Procedure PT Treatments $Gait Training: 8-22 mins   PT G Codes:         Scheryl Marten PT, DPT  [phone]  11/06/2016, 8:58 AM

## 2016-11-06 NOTE — Consult Note (Addendum)
[facility]  Total days of antibiotics 4        Day 4 vanco        Day 4 piptazo               Reason for Consult:MSSA deep tissue infection/tenosynovisitis of L ankle    Referring Physician: patel  Principal Problem:   Abscess Active Problems:   HTN (hypertension)   Acute renal failure (ARF) (HCC)   Leukocytosis   Cellulitis of left lower extremity   IVDU (intravenous drug user)   Abscess of ankle    HPI: Travis Salinas is a 66 y.o. male  With hx of chronic hepatitis C, GT1a, F2/3, treated with harvoni in 2016, shortly reinfected after SVR 14+VL, then started 16 wk course of mayvert in Nov 2017, followed by Dr Linus Salmons. He went to the ED last week for new onset of left ankle pain where it was initially treated for gout. He came back to the ED on 2/25 for worsening symptoms, difficulty to bear weight, erythema to dorsum of foot and development of abscess. He underwent bedside I x D with gram stain shwoing GPC,GNR, then seen by Dr Erlinda Hong who took him to the OR for debridement  Left ankle abscess, anterior muscular compartment abscess, infectious tenosynovitis of tibialis anterior and extensor digitorum longus. He had debridement of skin, muscle, tendon, fascia, and bone of left lower leg, 30 x 5 x 5 cm (750 sq cm); Arthrotomy of left ankle joint for exploration of effusion; Complex wound repair of left lower leg 30 cm. He admitted to Dr Erlinda Hong he last used heroin in early February(using his own syringe and equipment). He denies injecting into his feet. Patient was empirically started on vanco and piptazo but cultures only identified MSSA. ID asked to weight in on abtx and duration of treatment.  Past Medical History:  Diagnosis Date  . Arthritis   . Cervical disc herniation   . COPD (chronic obstructive pulmonary disease) ([place])   . Hepatitis C   . Hypertension     Allergies:  Allergies  Allergen Reactions  . Propoxyphene Nausea And Vomiting  . Ciprofloxacin  Nausea Only  . Depakote [Divalproex Sodium] Other (See Comments)    hallucinations   MEDICATIONS: . amLODipine  10 mg Oral q morning - 10a  . ARIPiprazole  15 mg Oral Daily  . aspirin EC  81 mg Oral Daily  . atenolol  25 mg Oral QHS  .  ceFAZolin (ANCEF) IV  2 g Intravenous Q8H  . clonazePAM  1 mg Oral BID  . enoxaparin (LOVENOX) injection  40 mg Subcutaneous Q24H  . FLUoxetine  20 mg Oral Daily  . ketorolac  30 mg Intravenous Q6H  . lamoTRIgine  200 mg Oral q morning - 10a  . lisinopril  5 mg Oral Daily  . methocarbamol  500 mg Oral TID  . tamsulosin  0.4 mg Oral QPC supper    Social History  Substance Use Topics  . Smoking status: Current Some Day Smoker    Packs/day: 0.50    Years: 25.00    Types: Cigarettes  . Smokeless tobacco: Never Used  . Alcohol use No    Family History  Problem Relation Age of Onset  . Heart disease Mother   . Stroke Mother   . Heart disease Father   . Stroke Father     Review of Systems  Constitutional: Negative for fever, chills, diaphoresis, activity change,  appetite change, fatigue and unexpected weight change.  HENT: Negative for congestion, sore throat, rhinorrhea, sneezing, trouble swallowing and sinus pressure.  Eyes: Negative for photophobia and visual disturbance.  Respiratory: Negative for cough, chest tightness, shortness of breath, wheezing and stridor.  Cardiovascular: Negative for chest pain, palpitations and leg swelling.  Gastrointestinal: Negative for nausea, vomiting, abdominal pain, diarrhea, constipation, blood in stool, abdominal distention and anal bleeding.  Genitourinary: Negative for dysuria, hematuria, flank pain and difficulty urinating.  Musculoskeletal: Negative for myalgias, back pain, joint swelling, arthralgias and gait problem.  Skin: leg pain and erythema Neurological: Negative for dizziness, tremors, weakness and light-headedness.  Hematological: Negative for adenopathy. Does not bruise/bleed easily.    Psychiatric/Behavioral: Negative for behavioral problems, confusion, sleep disturbance, dysphoric mood, decreased concentration and agitation.     OBJECTIVE: Temp:  [98 F (36.7 C)-98.3 F (36.8 C)] 98.1 F (36.7 C) (02/28 1500) Pulse Rate:  [50-72] 64 (02/28 1500) Resp:  [18-20] 18 (02/28 1500) BP: (141-168)/(68-76) 162/68 (02/28 1500) SpO2:  [98 %-99 %] 98 % (02/28 1500) Weight:  [145 lb 12.8 oz (66.1 kg)] 145 lb 12.8 oz (66.1 kg) (02/28 0402) Physical Exam  Constitutional: He is oriented to person, place, and time. He appears well-developed and well-nourished. No distress.  HENT:  Mouth/Throat: Oropharynx is clear and moist. No oropharyngeal exudate.  Cardiovascular: Normal rate, regular rhythm and normal heart sounds. Exam reveals no gallop and no friction rub.  No murmur heard.  Pulmonary/Chest: Effort normal and breath sounds normal. No respiratory distress. He has no wheezes.  Abdominal: Soft. Bowel sounds are normal. He exhibits no distension. There is no tenderness.  Lymphadenopathy:  He has no cervical adenopathy.  Neurological: He is alert and oriented to person, place, and time.  Skin:left foot/ankle wrapped iwht jp drain in place Psychiatric: He has a normal mood and affect. His behavior is normal.     LABS: Results for orders placed or performed during the hospital encounter of 11/03/16 (from the past 48 hour(s))  CBC     Status: Abnormal   Collection Time: 11/05/16  6:04 AM  Result Value Ref Range   WBC 12.7 (H) 4.0 - 10.5 K/uL   RBC 4.16 (L) 4.22 - 5.81 MIL/uL   Hemoglobin 10.8 (L) 13.0 - 17.0 g/dL   HCT 34.0 (L) 39.0 - 52.0 %   MCV 81.7 78.0 - 100.0 fL   MCH 26.0 26.0 - 34.0 pg   MCHC 31.8 30.0 - 36.0 g/dL   RDW 14.1 11.5 - 15.5 %   Platelets 323 150 - 400 K/uL  Basic metabolic panel     Status: Abnormal   Collection Time: 11/05/16  6:04 AM  Result Value Ref Range   Sodium 139 135 - 145 mmol/L   Potassium 3.5 3.5 - 5.1 mmol/L   Chloride 106 101 -  111 mmol/L   CO2 25 22 - 32 mmol/L   Glucose, Bld 92 65 - 99 mg/dL   BUN 18 6 - 20 mg/dL   Creatinine, Ser 1.05 0.61 - 1.24 mg/dL   Calcium 8.2 (L) 8.9 - 10.3 mg/dL   GFR calc non Af Amer >60 >60 mL/min   GFR calc Af Amer >60 >60 mL/min    Comment: (NOTE) The eGFR has been calculated using the CKD EPI equation. This calculation has not been validated in all clinical situations. eGFR's persistently <60 mL/min signify possible Chronic Kidney Disease.    Anion gap 8 5 - 15  CBC     Status:  Abnormal   Collection Time: 11/06/16  4:55 AM  Result Value Ref Range   WBC 13.2 (H) 4.0 - 10.5 K/uL   RBC 4.60 4.22 - 5.81 MIL/uL   Hemoglobin 11.9 (L) 13.0 - 17.0 g/dL   HCT 37.6 (L) 39.0 - 52.0 %   MCV 81.7 78.0 - 100.0 fL   MCH 25.9 (L) 26.0 - 34.0 pg   MCHC 31.6 30.0 - 36.0 g/dL   RDW 14.0 11.5 - 15.5 %   Platelets 392 150 - 400 K/uL  Basic metabolic panel     Status: Abnormal   Collection Time: 11/06/16  4:55 AM  Result Value Ref Range   Sodium 138 135 - 145 mmol/L   Potassium 3.4 (L) 3.5 - 5.1 mmol/L   Chloride 104 101 - 111 mmol/L   CO2 25 22 - 32 mmol/L   Glucose, Bld 103 (H) 65 - 99 mg/dL   BUN 16 6 - 20 mg/dL   Creatinine, Ser 1.13 0.61 - 1.24 mg/dL   Calcium 8.8 (L) 8.9 - 10.3 mg/dL   GFR calc non Af Amer >60 >60 mL/min   GFR calc Af Amer >60 >60 mL/min    Comment: (NOTE) The eGFR has been calculated using the CKD EPI equation. This calculation has not been validated in all clinical situations. eGFR's persistently <60 mL/min signify possible Chronic Kidney Disease.    Anion gap 9 5 - 15    MICRO: 2/25 left ankle tissue MSSA 2/25 left abscess MSSA 2/25 abscess - PENDING group c strep, plus GNR  Assessment/Plan: 66yo M with chronic hep C without hepatic coma, failed harvoni, currently on mavyret (though not taking during this hosp) admitted for left foot abscess, ankle effusion, and cellulitis found to have MSSA deep tissue infection of left leg with possible septic  arthritis  - d/c vancomycin and pip/tazo - recommend to start cefazolin - will follow up wound cx though I suspect this is all due to MSSA - will need minimum of 4 wk to treat septic arthritis, then follow up with oral abtx  Chronic hep C without hepatic coma - plan to check hep c viral load - he had finished treatment roughly 1-2 months  Ongoing IV drug use - this will pose to be a problem for dispo. Patient unsafe to go home with picc line due to active IVDU history  Dr Linus Salmons to provider further recs and knows patient as he manages his hepatitis

## 2016-11-06 NOTE — Progress Notes (Signed)
   Subjective:  Patient reports pain as severe at times.  Objective:   VITALS:   Vitals:   11/05/16 1420 11/05/16 2020 11/05/16 2025 11/06/16 0402  BP: (!) 144/66 (!) 141/76 (!) 141/72 (!) 168/70  Pulse: 65 72 70 (!) 50  Resp: 20 20 20 20   Temp: 98.2 F (36.8 C) 98.2 F (36.8 C) 98 F (36.7 C) 98.3 F (36.8 C)  TempSrc: Oral Oral Oral Oral  SpO2: 99% 99% 98% 98%  Weight:    145 lb 12.8 oz (66.1 kg)  Height:        Neurologically intact Neurovascular intact Sensation intact distally Intact pulses distally Dorsiflexion/Plantar flexion intact Incision: dressing C/D/I and no drainage No cellulitis present Compartment soft   Lab Results  Component Value Date   WBC 13.2 (H) 11/06/2016   HGB 11.9 (L) 11/06/2016   HCT 37.6 (L) 11/06/2016   MCV 81.7 11/06/2016   PLT 392 11/06/2016     Assessment/Plan:  3 Days Post-Op   - MSSA infection - recommend ID consult for treatment recs - continue JP drain for now - dressings changed  Eduard Roux 11/06/2016, 2:43 PM [phone]

## 2016-11-06 NOTE — Progress Notes (Signed)
[facility] Progress Note  Patient: Travis Salinas [id number]   PCP: [name], MD DOB: [birth year]   DOA: 11/03/2016   DOS: 11/06/2016   Date of Service: the patient was seen and examined on 11/06/2016   Subjective: feeling better, no acute complains  Brief hospital course: Pt. with PMH of hypertension, polysubstance abuse with heroin and cocaine; admitted on 11/03/2016, with complaint of left ankle pain and swelling, was found to have cellulitis with left ankle abscess. S/P debridement of the left lower leg, arthrotomy. Currently has a JP drain Currently further plan is continue JP drain for 2-3 days per surgery.  Assessment and Plan: 1. Left ankle cellulitis with abscess. S/P debridement of the left lower leg and arthrotomy, currently has a JP drain. Staph aureus and group C strep in culture, also possible anaerobe.  Orthopedics currently following, recommends to continue JP drain for today. polymicrobial infection based on the cultures. MSSA in deep wounds. ID consulted. Was on vancomycin and Zosyn. Now on cefazolin per ID.  PT recommends home health,  For pain management continuing the patient on scheduled Toradol, scheduled Robaxin. Pt remaining compliant with the regimen and mentions his pain is well controlled.  2. Essential hypertension. Continuing home regimen.  3. Mood disorder. Continuing home regimen of Klonopin, Prozac, Abilify, Lamictal.  4. IV drug abuse. UDS is positive for opiates, cocaine, cannabis use. counseled for quitting drug abuse due to severe infection at present. Social worker consulted.  Bowel regimen: last BM 11/04/2016 Diet: Cardiac diet DVT Prophylaxis: subcutaneous Heparin  Advance goals of care discussion: Full code  Family Communication: no family was present at bedside, at the time of interview.  Disposition:  Discharge to home probably with home health 11/09/2016  Consultants: Orthopedics, ID Procedures: Incision and  debridement  Antibiotics: Anti-infectives    Start     Dose/Rate Route Frequency Ordered Stop   11/06/16 1545  ceFAZolin (ANCEF) IVPB 2g/100 mL premix     2 g 200 mL/hr over 30 Minutes Intravenous Every 8 hours 11/06/16 1544     11/04/16 2300  vancomycin (VANCOCIN) IVPB 1000 mg/200 mL premix  Status:  Discontinued     1,000 mg 200 mL/hr over 60 Minutes Intravenous Every 12 hours 11/04/16 1010 11/06/16 1543   11/04/16 2200  piperacillin-tazobactam (ZOSYN) IVPB 3.375 g  Status:  Discontinued     3.375 g 12.5 mL/hr over 240 Minutes Intravenous Every 8 hours 11/04/16 1901 11/06/16 1543   11/04/16 1015  vancomycin (VANCOCIN) 500 mg in sodium chloride 0.9 % 100 mL IVPB     500 mg 100 mL/hr over 60 Minutes Intravenous STAT 11/04/16 1010 11/04/16 1225   11/04/16 0700  vancomycin (VANCOCIN) 500 mg in sodium chloride 0.9 % 100 mL IVPB  Status:  Discontinued     500 mg 100 mL/hr over 60 Minutes Intravenous Every 12 hours 11/03/16 1857 11/04/16 1010   11/03/16 1938  vancomycin (VANCOCIN) 1-5 GM/200ML-% IVPB    Comments:  Valda Lamb   : cabinet override      11/03/16 1938 11/04/16 0744   11/03/16 1900  piperacillin-tazobactam (ZOSYN) IVPB 3.375 g  Status:  Discontinued     3.375 g 12.5 mL/hr over 240 Minutes Intravenous Every 8 hours 11/03/16 1857 11/04/16 1901   11/03/16 1900  vancomycin (VANCOCIN) 1,500 mg in sodium chloride 0.9 % 500 mL IVPB  Status:  Discontinued     1,500 mg 250 mL/hr over 120 Minutes Intravenous  Once 11/03/16 1858 11/04/16 1010  11/03/16 1320  piperacillin-tazobactam (ZOSYN) 3.375 GM/50ML IVPB    Comments:  Modena Morrow   : cabinet override      11/03/16 1320 11/03/16 1509   11/03/16 1245  piperacillin-tazobactam (ZOSYN) IVPB 4.5 g  Status:  Discontinued     4.5 g 200 mL/hr over 30 Minutes Intravenous  Once 11/03/16 1235 11/03/16 1355   11/03/16 1245  vancomycin (VANCOCIN) IVPB 1000 mg/200 mL premix     1,000 mg 200 mL/hr over 60 Minutes Intravenous  Once  11/03/16 1235 11/03/16 1600      Objective: Physical Exam: Vitals:   11/05/16 2020 11/05/16 2025 11/06/16 0402 11/06/16 1500  BP: (!) 141/76 (!) 141/72 (!) 168/70 (!) 162/68  Pulse: 72 70 (!) 50 64  Resp: 20 20 20 18   Temp: 98.2 F (36.8 C) 98 F (36.7 C) 98.3 F (36.8 C) 98.1 F (36.7 C)  TempSrc: Oral Oral Oral Oral  SpO2: 99% 98% 98% 98%  Weight:   66.1 kg (145 lb 12.8 oz)   Height:        Intake/Output Summary (Last 24 hours) at 11/06/16 1544 Last data filed at 11/06/16 1521  Gross per 24 hour  Intake             2180 ml  Output              430 ml  Net             1750 ml   Filed Weights   11/04/16 0552 11/05/16 0652 11/06/16 0402  Weight: 70.2 kg (154 lb 11.2 oz) 66.4 kg (146 lb 6.4 oz) 66.1 kg (145 lb 12.8 oz)    General: Alert, Awake and Oriented to Time, Place and Person. Appear in mild distress, affect appropriate Eyes: PERRL, Conjunctiva normal ENT: Oral Mucosa clear moist. Neck: no JVD, no Abnormal Mass Or lumps Cardiovascular: S1 and S2 Present, no Murmur, Respiratory: Bilateral Air entry equal and Decreased, no use of accessory muscle, Clear to Auscultation, no Crackles, no wheezes Abdomen: Bowel Sound present, Soft and no tenderness Skin: no redness, no Rash, no induration Extremities: Left leg in brace, no Pedal edema, no calf tenderness Neurologic: Grossly no focal neuro deficit. Bilaterally Equal motor strength  Data Reviewed: CBC:  Recent Labs Lab 11/03/16 1109 11/04/16 0217 11/05/16 0604 11/06/16 0455  WBC 25.3* 23.5* 12.7* 13.2*  NEUTROABS 21.8*  --   --   --   HGB 13.5 11.1* 10.8* 11.9*  HCT 40.1 34.3* 34.0* 37.6*  MCV 80.0 82.3 81.7 81.7  PLT 284 308 323 0000000   Basic Metabolic Panel:  Recent Labs Lab 11/03/16 1155 11/04/16 0217 11/05/16 0604 11/06/16 0455  NA 133* 137 139 138  K 3.6 4.1 3.5 3.4*  CL 102 106 106 104  CO2 22 23 25 25   GLUCOSE 127* 162* 92 103*  BUN 49* 30* 18 16  CREATININE 1.66* 1.11 1.05 1.13  CALCIUM  8.9 8.4* 8.2* 8.8*    Liver Function Tests:  Recent Labs Lab 11/04/16 0217  AST 25  ALT 15*  ALKPHOS 69  BILITOT 0.7  PROT 5.9*  ALBUMIN 2.8*   No results for input(s): LIPASE, AMYLASE in the last 168 hours. No results for input(s): AMMONIA in the last 168 hours. Coagulation Profile:  Recent Labs Lab 11/03/16 1155  INR 1.02   Cardiac Enzymes: No results for input(s): CKTOTAL, CKMB, CKMBINDEX, TROPONINI in the last 168 hours. BNP (last 3 results) No results for input(s): PROBNP in the last 8760  hours.  CBG: No results for input(s): GLUCAP in the last 168 hours.  Studies: No results found.   Scheduled Meds: . amLODipine  10 mg Oral q morning - 10a  . ARIPiprazole  15 mg Oral Daily  . aspirin EC  81 mg Oral Daily  . atenolol  25 mg Oral QHS  .  ceFAZolin (ANCEF) IV  2 g Intravenous Q8H  . clonazePAM  1 mg Oral BID  . enoxaparin (LOVENOX) injection  40 mg Subcutaneous Q24H  . FLUoxetine  20 mg Oral Daily  . ketorolac  30 mg Intravenous Q6H  . lamoTRIgine  200 mg Oral q morning - 10a  . lisinopril  5 mg Oral Daily  . methocarbamol  500 mg Oral TID  . tamsulosin  0.4 mg Oral QPC supper   Continuous Infusions:  PRN Meds: acetaminophen **OR** acetaminophen, albuterol, hydrALAZINE, oxyCODONE-acetaminophen, zolpidem  Time spent: 30 minutes  Author: Berle Mull, MD [facility] Pager: [phone] 11/06/2016 3:44 PM  If 7PM-7AM, please contact night-coverage at [email or web address], password [facility]

## 2016-11-07 DIAGNOSIS — L02416 Cutaneous abscess of left lower limb: Secondary | ICD-10-CM

## 2016-11-07 DIAGNOSIS — B954 Other streptococcus as the cause of diseases classified elsewhere: Secondary | ICD-10-CM

## 2016-11-07 DIAGNOSIS — F1910 Other psychoactive substance abuse, uncomplicated: Secondary | ICD-10-CM

## 2016-11-07 DIAGNOSIS — Z5181 Encounter for therapeutic drug level monitoring: Secondary | ICD-10-CM

## 2016-11-07 DIAGNOSIS — Z9689 Presence of other specified functional implants: Secondary | ICD-10-CM

## 2016-11-07 DIAGNOSIS — B9561 Methicillin susceptible Staphylococcus aureus infection as the cause of diseases classified elsewhere: Secondary | ICD-10-CM

## 2016-11-07 DIAGNOSIS — Z8619 Personal history of other infectious and parasitic diseases: Secondary | ICD-10-CM

## 2016-11-07 LAB — CBC
HCT: 37.6 % — ABNORMAL LOW (ref 39.0–52.0)
Hemoglobin: 12 g/dL — ABNORMAL LOW (ref 13.0–17.0)
MCH: 26.2 pg (ref 26.0–34.0)
MCHC: 31.9 g/dL (ref 30.0–36.0)
MCV: 82.1 fL (ref 78.0–100.0)
PLATELETS: 407 10*3/uL — AB (ref 150–400)
RBC: 4.58 MIL/uL (ref 4.22–5.81)
RDW: 14.1 % (ref 11.5–15.5)
WBC: 13.2 10*3/uL — ABNORMAL HIGH (ref 4.0–10.5)

## 2016-11-07 LAB — BASIC METABOLIC PANEL
Anion gap: 8 (ref 5–15)
BUN: 14 mg/dL (ref 6–20)
CALCIUM: 8.9 mg/dL (ref 8.9–10.3)
CO2: 24 mmol/L (ref 22–32)
CREATININE: 1.1 mg/dL (ref 0.61–1.24)
Chloride: 106 mmol/L (ref 101–111)
GFR calc Af Amer: >60 mL/min (ref >60)
GLUCOSE: 118 mg/dL — AB (ref 65–99)
POTASSIUM: 3.7 mmol/L (ref 3.5–5.1)
SODIUM: 138 mmol/L (ref 135–145)

## 2016-11-07 LAB — AEROBIC/ANAEROBIC CULTURE (SURGICAL/DEEP WOUND)

## 2016-11-07 LAB — CALCIUM / CREATININE RATIO, URINE
CALCIUM UR: 4.7 mg/dL
CALCIUM/CREAT. RATIO: 85 mg/g{creat} (ref 0–260)
Creatinine, Urine: 55.5 mg/dL

## 2016-11-07 LAB — AEROBIC/ANAEROBIC CULTURE W GRAM STAIN (SURGICAL/DEEP WOUND)

## 2016-11-07 MED ORDER — GABAPENTIN 100 MG PO CAPS
100.0000 mg | ORAL_CAPSULE | Freq: Three times a day (TID) | ORAL | Status: DC
  Administered 2016-11-07 – 2016-11-09 (×6): 100 mg via ORAL
  Filled 2016-11-07 (×6): qty 1

## 2016-11-07 NOTE — Progress Notes (Addendum)
[facility] for Infectious Disease   Reason for visit: Follow up on abscess Day 5 antibiotics Cefazolin day 2  Interval History: he has MSSA and Strep in his cultures, blood cultures remain negative.   No associated n/v/d.  Is worried about losing his foot/leg.    Physical Exam: Constitutional:  Vitals:   11/07/16 0533 11/07/16 0834  BP: 135/78 (!) 156/69  Pulse: 77 67  Resp: 18 18  Temp: 97.6 F (36.4 C) 97.8 F (36.6 C)   patient appears in NAD Eyes: anicteric HENT: no thrush Respiratory: Normal respiratory effort; CTA B Cardiovascular: RRR MS: leg wrapped, drain in with blood tinged drainage  Review of Systems: Constitutional: negative for fevers and malaise Gastrointestinal: negative for diarrhea Integument/breast: negative for rash  Lab Results  Component Value Date   WBC 13.2 (H) 11/07/2016   HGB 12.0 (L) 11/07/2016   HCT 37.6 (L) 11/07/2016   MCV 82.1 11/07/2016   PLT 407 (H) 11/07/2016    Lab Results  Component Value Date   CREATININE 1.10 11/07/2016   BUN 14 11/07/2016   NA 138 11/07/2016   K 3.7 11/07/2016   CL 106 11/07/2016   CO2 24 11/07/2016    Lab Results  Component Value Date   ALT 15 (L) 11/04/2016   AST 25 11/04/2016   ALKPHOS 69 11/04/2016     Microbiology: Recent Results (from the past 240 hour(s))  Culture, blood (routine x 2)     Status: None (Preliminary result)   Collection Time: 11/03/16 11:05 AM  Result Value Ref Range Status   Specimen Description BLOOD LEFT UPPER ARM  Final   Special Requests BOTTLES DRAWN AEROBIC AND ANAEROBIC [facility] EACH  Final   Culture   Final    NO GROWTH 3 DAYS Performed at [facility], [address] [place], [place] [address]    Report Status PENDING  Incomplete  Culture, blood (routine x 2)     Status: None (Preliminary result)   Collection Time: 11/03/16 11:40 AM  Result Value Ref Range Status   Specimen Description BLOOD LEFT HAND  Final   Special Requests BOTTLES DRAWN AEROBIC  AND ANAEROBIC 5CC EACH  Final   Culture   Final    NO GROWTH 3 DAYS Performed at [facility], [place] [address], [place], [place] [address]    Report Status PENDING  Incomplete  Aerobic/Anaerobic Culture (surgical/deep wound)     Status: None (Preliminary result)   Collection Time: 11/03/16  1:25 PM  Result Value Ref Range Status   Specimen Description ANKLE LEFT  Final   Special Requests NONE  Final   Gram Stain   Final    MODERATE WBC PRESENT, PREDOMINANTLY PMN RARE SQUAMOUS EPITHELIAL CELLS PRESENT ABUNDANT GRAM POSITIVE COCCI IN PAIRS ABUNDANT GRAM NEGATIVE COCCOBACILLI FEW GRAM NEGATIVE RODS    Culture   Final    MODERATE STREPTOCOCCUS GROUP C HOLDING FOR POSSIBLE ANAEROBE Performed at [facility], [place] [address] [place], [place] [address]    Report Status PENDING  Incomplete  Aerobic/Anaerobic Culture (surgical/deep wound)     Status: None (Preliminary result)   Collection Time: 11/03/16  8:09 PM  Result Value Ref Range Status   Specimen Description ABSCESS LEFT ANKLE  Final   Special Requests ID A PATIENT ON FOLLOWING VANCOMYCIN  Final   Gram Stain   Final    MODERATE WBC PRESENT, PREDOMINANTLY PMN FEW GRAM POSITIVE COCCI IN CLUSTERS    Culture  Final    ABUNDANT STAPHYLOCOCCUS AUREUS NO ANAEROBES ISOLATED; CULTURE IN PROGRESS FOR 5 DAYS    Report Status PENDING  Incomplete   Organism ID, Bacteria STAPHYLOCOCCUS AUREUS  Final      Susceptibility   Staphylococcus aureus - MIC*    CIPROFLOXACIN 2 INTERMEDIATE Intermediate     ERYTHROMYCIN >=8 RESISTANT Resistant     GENTAMICIN <=0.5 SENSITIVE Sensitive     OXACILLIN 0.5 SENSITIVE Sensitive     TETRACYCLINE <=1 SENSITIVE Sensitive     VANCOMYCIN 1 SENSITIVE Sensitive     TRIMETH/SULFA <=10 SENSITIVE Sensitive     CLINDAMYCIN <=0.25 SENSITIVE Sensitive     RIFAMPIN <=0.5 SENSITIVE Sensitive     Inducible Clindamycin NEGATIVE Sensitive     * ABUNDANT STAPHYLOCOCCUS AUREUS  Aerobic/Anaerobic Culture  (surgical/deep wound)     Status: None (Preliminary result)   Collection Time: 11/03/16  8:20 PM  Result Value Ref Range Status   Specimen Description TISSUE LEFT ANKLE  Final   Special Requests SOFT TISSUE ID B PATIENT ON FOLLOWING VANCOMYCIN  Final   Gram Stain   Final    ABUNDANT WBC PRESENT, PREDOMINANTLY PMN ABUNDANT GRAM POSITIVE COCCI IN PAIRS IN CLUSTERS    Culture   Final    ABUNDANT STAPHYLOCOCCUS AUREUS NO ANAEROBES ISOLATED; CULTURE IN PROGRESS FOR 5 DAYS    Report Status PENDING  Incomplete   Organism ID, Bacteria STAPHYLOCOCCUS AUREUS  Final      Susceptibility   Staphylococcus aureus - MIC*    CIPROFLOXACIN 2 INTERMEDIATE Intermediate     ERYTHROMYCIN >=8 RESISTANT Resistant     GENTAMICIN <=0.5 SENSITIVE Sensitive     OXACILLIN 0.5 SENSITIVE Sensitive     TETRACYCLINE <=1 SENSITIVE Sensitive     VANCOMYCIN 1 SENSITIVE Sensitive     TRIMETH/SULFA <=10 SENSITIVE Sensitive     CLINDAMYCIN <=0.25 SENSITIVE Sensitive     RIFAMPIN <=0.5 SENSITIVE Sensitive     Inducible Clindamycin NEGATIVE Sensitive     * ABUNDANT STAPHYLOCOCCUS AUREUS    Impression/Plan:  1. Abscess with effusion - I think optimal treatment for this with tenosynovitis and likely septic arthritis is IV therapy for the intial 2 weeks. He is willing to go to a SNF for continuation of the IV cefazolin for that 2 week period.  He understands that he would not be able to go home with a picc line After 2 weeks, can change to high dose bactrim orally and rifampin 300 mg bid for another 3-4 weeks Remove picc line at discharge from SNF We will arrange follow up with [place] in about 2 weeks I will defer timing of placement of picc to [facility] to be sure he wants to go to SNF.  He was agreeable with me.   2. Hepatitis C - has completed treatment again.  HCV viral load pending.  Will need repeat viral load in about 4-5 months. I discussed with him that if he is reinfected, he would not be a candidate for  retreatment  3.  Medication monitoring - labs per SNF protocol.  As above, 2 weeks of IV cefazolin at SNF and then Bactrim, 2 DS tabs twice a day (4 tabs per day) with rifampin 300 mg twice a day after discharge from the SNF  4. Substance abuse - relapsed.  Counseled on remaining drug free.

## 2016-11-07 NOTE — Progress Notes (Signed)
   Subjective:  Patient reports pain as severe at times.  Objective:   VITALS:   Vitals:   11/07/16 0533 11/07/16 0834 11/07/16 1218 11/07/16 1358  BP: 135/78 (!) 156/69 131/61 135/73  Pulse: 77 67 68 69  Resp: 18 18 18 18   Temp: 97.6 F (36.4 C) 97.8 F (36.6 C) 98.3 F (36.8 C) 98.4 F (36.9 C)  TempSrc: Oral Oral Oral Oral  SpO2: 97% 99% 98% 98%  Weight: 142 lb 11.2 oz (64.7 kg)     Height:        Neurologically intact Neurovascular intact Sensation intact distally Intact pulses distally Dorsiflexion/Plantar flexion intact Incision: dressing C/D/I and no drainage No cellulitis present Compartment soft   Lab Results  Component Value Date   WBC 13.2 (H) 11/07/2016   HGB 12.0 (L) 11/07/2016   HCT 37.6 (L) 11/07/2016   MCV 82.1 11/07/2016   PLT 407 (H) 11/07/2016     Assessment/Plan:  4 Days Post-Op   - JP drain removed today - appreciate ID recs - I'll see him back in the office in 2 weeks  Eduard Roux 11/07/2016, 2:53 PM [phone]

## 2016-11-07 NOTE — Progress Notes (Signed)
Physical Therapy Treatment Patient Details Name: Travis Salinas MRN: [id number] DOB: [birth year] Today's Date: 11/07/2016    History of Present Illness Pt is a 67 yo male admitted through ED for left leg abscess. Pt was being treated for gout initally since 11/01/26 and then returned to ED in [place] and was transferred to [facility] on 11/03/16 where he underwent a debridement of skin, muscle, tendon, fascia and bone, arthrotomy of left ankle and complex wound repair. Pt was found to have leukocytosis, ARF, and cellulitis. PMH significant for HTN, COPD, Hep C, and has a history of IV drug abuse.     PT Comments    Pt progressing well.  Pt required re-education on technique during gait and required use of RW to improve function.  Pt able to donn and doff CAM boot without assistance.  Plan next session for HEP.  Continue to recommend HHPT.    Follow Up Recommendations  Home health PT     Equipment Recommendations  None recommended by PT    Recommendations for Other Services       Precautions / Restrictions Precautions Precautions: None Required Braces or Orthoses: Other Brace/Splint Other Brace/Splint: cam walker LLE Restrictions Weight Bearing Restrictions: Yes LLE Weight Bearing: Weight bearing as tolerated    Mobility  Bed Mobility Overal bed mobility: Modified Independent             General bed mobility comments: Able to get OOB without any assistance  Transfers Overall transfer level: Needs assistance Equipment used: None (Pt impulsively stood without RW.  ) Transfers: Sit to/from Stand Sit to Stand: Supervision         General transfer comment: supervision for safety with education on why the RW is important.    Ambulation/Gait Ambulation/Gait assistance: Supervision;Min guard Ambulation Distance (Feet):  (20 ft (no AD)+ 500 ft (AD).  ) Assistive device: Rolling walker (2 wheeled) Gait Pattern/deviations: Step-through pattern;Antalgic Gait velocity:  decreased Gait velocity interpretation: Below normal speed for age/gender General Gait Details: Mild antalgic gait with cam walker on LLE. Pt reports he wants to try without RW but remains to c/o pain with RW gait improves and appears less painful.     Stairs Stairs: Yes   Stair Management: Two rails;Alternating pattern;Step to pattern Number of Stairs: 10 General stair comments: Pt initially performed alternating pattern.  Cues to perform step to to reduce pain on surgical limb.  Pt able to follow commands during second trial.    Wheelchair Mobility    Modified Rankin (Stroke Patients Only)       Balance Overall balance assessment: No apparent balance deficits (not formally assessed)                                  Cognition Arousal/Alertness: Awake/alert Behavior During Therapy: WFL for tasks assessed/performed (slighty impulsive) Overall Cognitive Status: Within Functional Limits for tasks assessed                      Exercises      General Comments        Pertinent Vitals/Pain Pain Assessment: 0-10 Pain Score: 8  Pain Location: R ankle with weight bearing.   Pain Descriptors / Indicators: Throbbing;Sharp Pain Intervention(s): Monitored during session;Repositioned    Home Living                      Prior Function  PT Goals (current goals can now be found in the care plan section) Acute Rehab PT Goals Patient Stated Goal: to not have pain with walking Potential to Achieve Goals: Good Progress towards PT goals: Progressing toward goals    Frequency    Min 3X/week      PT Plan Current plan remains appropriate    Co-evaluation             End of Session Equipment Utilized During Treatment: Gait belt Activity Tolerance: Patient tolerated treatment well Patient left: in chair;with call bell/phone within reach Nurse Communication: Mobility status PT Visit Diagnosis: Difficulty in walking, not  elsewhere classified (R26.2);Pain Pain - Right/Left: Left Pain - part of body: Leg     Time: [id number] PT Time Calculation (min) (ACUTE ONLY): 23 min  Charges:  $Gait Training: 8-22 mins $Therapeutic Activity: 8-22 mins                    G Codes:       Cristela Blue 12/05/16, 4:01 PM Governor Rooks, PTA pager [phone]

## 2016-11-07 NOTE — Plan of Care (Signed)
Problem: Pain Managment: Goal: General experience of comfort will improve Outcome: Not Progressing Pain not relieved  Problem: Skin Integrity: Goal: Risk for impaired skin integrity will decrease Outcome: Not Progressing Wound continues to drain small amount of serosanguinous drainage  Problem: Tissue Perfusion: Goal: Risk factors for ineffective tissue perfusion will decrease Outcome: Progressing SCDs on, no s/s of DVT noted  Problem: Activity: Goal: Risk for activity intolerance will decrease Outcome: Progressing Tolerates activity well  Problem: Bowel/Gastric: Goal: Will not experience complications related to bowel motility Outcome: Progressing No bowel issues reported

## 2016-11-07 NOTE — Progress Notes (Signed)
[facility] Progress Note  Patient: Travis Salinas [id number]   PCP: [name], MD DOB: Mar 26, 1951   DOA: 11/03/2016   DOS: 11/07/2016   Date of Service: the patient was seen and examined on 11/07/2016   Subjective: feeling better,Complains about continuous burning pain in the leg.  Brief hospital course: Pt. with PMH of hypertension, polysubstance abuse with heroin and cocaine; admitted on 11/03/2016, with complaint of left ankle pain and swelling, was found to have cellulitis with left ankle abscess. S/P debridement of the left lower leg, arthrotomy. Currently has a JP drain Currently further plan is continue JP drain for 2-3 days per surgery.  Assessment and Plan: 1. Left ankle cellulitis with abscess. S/P debridement of the left lower leg and arthrotomy, currently has a JP drain. Staph aureus and group C strep in culture, also possible anaerobe.  Orthopedics currently following, recommends to continue JP drain for today. polymicrobial infection based on the cultures. MSSA in deep wounds. ID consulted. Was on vancomycin and Zosyn. Now on cefazolin per ID. patient will get a PICC line. Will be transitioned to SNF for completion of IV antibiotics for 2 weeks. Discharge pending JP tube removal. PT recommends home health,  For pain management continuing the patient on scheduled Toradol, scheduled Robaxin. Complaining about burning pain, I will add gabapentin.  2. Essential hypertension. Continuing home regimen.  3. Mood disorder. Continuing home regimen of Klonopin, Prozac, Abilify, Lamictal.  4. IV drug abuse. UDS is positive for opiates, cocaine, cannabis use. counseled for quitting drug abuse due to severe infection at present. Social worker consulted.  Bowel regimen: last BM 11/04/2016 Diet: Cardiac diet DVT Prophylaxis: subcutaneous Heparin  Advance goals of care discussion: Full code  Family Communication: no family was present at bedside, at the time of  interview.  Disposition:  Discharge to SNF pending JP tube removal.  Consultants: Orthopedics, ID Procedures: Incision and debridement  Antibiotics: Anti-infectives    Start     Dose/Rate Route Frequency Ordered Stop   11/06/16 2200  ceFAZolin (ANCEF) IVPB 2g/100 mL premix     2 g 200 mL/hr over 30 Minutes Intravenous Every 8 hours 11/06/16 1544     11/04/16 2300  vancomycin (VANCOCIN) IVPB 1000 mg/200 mL premix  Status:  Discontinued     1,000 mg 200 mL/hr over 60 Minutes Intravenous Every 12 hours 11/04/16 1010 11/06/16 1543   11/04/16 2200  piperacillin-tazobactam (ZOSYN) IVPB 3.375 g  Status:  Discontinued     3.375 g 12.5 mL/hr over 240 Minutes Intravenous Every 8 hours 11/04/16 1901 11/06/16 1543   11/04/16 1015  vancomycin (VANCOCIN) 500 mg in sodium chloride 0.9 % 100 mL IVPB     500 mg 100 mL/hr over 60 Minutes Intravenous STAT 11/04/16 1010 11/04/16 1225   11/04/16 0700  vancomycin (VANCOCIN) 500 mg in sodium chloride 0.9 % 100 mL IVPB  Status:  Discontinued     500 mg 100 mL/hr over 60 Minutes Intravenous Every 12 hours 11/03/16 1857 11/04/16 1010   11/03/16 1938  vancomycin (VANCOCIN) 1-5 GM/200ML-% IVPB    Comments:  Valda Lamb   : cabinet override      11/03/16 1938 11/04/16 0744   11/03/16 1900  piperacillin-tazobactam (ZOSYN) IVPB 3.375 g  Status:  Discontinued     3.375 g 12.5 mL/hr over 240 Minutes Intravenous Every 8 hours 11/03/16 1857 11/04/16 1901   11/03/16 1900  vancomycin (VANCOCIN) 1,500 mg in sodium chloride 0.9 % 500 mL IVPB  Status:  Discontinued     1,500 mg 250 mL/hr over 120 Minutes Intravenous  Once 11/03/16 1858 11/04/16 1010   11/03/16 1320  piperacillin-tazobactam (ZOSYN) 3.375 GM/50ML IVPB    Comments:  Modena Morrow   : cabinet override      11/03/16 1320 11/03/16 1509   11/03/16 1245  piperacillin-tazobactam (ZOSYN) IVPB 4.5 g  Status:  Discontinued     4.5 g 200 mL/hr over 30 Minutes Intravenous  Once 11/03/16 1235 11/03/16 1355    11/03/16 1245  vancomycin (VANCOCIN) IVPB 1000 mg/200 mL premix     1,000 mg 200 mL/hr over 60 Minutes Intravenous  Once 11/03/16 1235 11/03/16 1600      Objective: Physical Exam: Vitals:   11/07/16 0533 11/07/16 0834 11/07/16 1218 11/07/16 1358  BP: 135/78 (!) 156/69 131/61 135/73  Pulse: 77 67 68 69  Resp: 18 18 18 18   Temp: 97.6 F (36.4 C) 97.8 F (36.6 C) 98.3 F (36.8 C) 98.4 F (36.9 C)  TempSrc: Oral Oral Oral Oral  SpO2: 97% 99% 98% 98%  Weight: 64.7 kg (142 lb 11.2 oz)     Height:        Intake/Output Summary (Last 24 hours) at 11/07/16 1935 Last data filed at 11/07/16 1600  Gross per 24 hour  Intake              840 ml  Output             2310 ml  Net            -1470 ml   Filed Weights   11/05/16 0652 11/06/16 0402 11/07/16 0533  Weight: 66.4 kg (146 lb 6.4 oz) 66.1 kg (145 lb 12.8 oz) 64.7 kg (142 lb 11.2 oz)    General: Alert, Awake and Oriented to Time, Place and Person. Appear in mild distress, affect appropriate Eyes: PERRL, Conjunctiva normal ENT: Oral Mucosa clear moist. Neck: no JVD, no Abnormal Mass Or lumps Cardiovascular: S1 and S2 Present, no Murmur, Respiratory: Bilateral Air entry equal and Decreased, no use of accessory muscle, Clear to Auscultation, no Crackles, no wheezes Abdomen: Bowel Sound present, Soft and no tenderness Skin: no redness, no Rash, no induration Extremities: Left leg in brace, no Pedal edema, no calf tenderness Neurologic: Grossly no focal neuro deficit. Bilaterally Equal motor strength  Data Reviewed: CBC:  Recent Labs Lab 11/03/16 1109 11/04/16 0217 11/05/16 0604 11/06/16 0455 11/07/16 0543  WBC 25.3* 23.5* 12.7* 13.2* 13.2*  NEUTROABS 21.8*  --   --   --   --   HGB 13.5 11.1* 10.8* 11.9* 12.0*  HCT 40.1 34.3* 34.0* 37.6* 37.6*  MCV 80.0 82.3 81.7 81.7 82.1  PLT 284 308 323 392 [id number]   Basic Metabolic Panel:  Recent Labs Lab 11/03/16 1155 11/04/16 0217 11/05/16 0604 11/06/16 0455 11/07/16 0543  NA  133* 137 139 138 138  K 3.6 4.1 3.5 3.4* 3.7  CL 102 106 106 104 106  CO2 22 23 25 25 24   GLUCOSE 127* 162* 92 103* 118*  BUN 49* 30* 18 16 14   CREATININE 1.66* 1.11 1.05 1.13 1.10  CALCIUM 8.9 8.4* 8.2* 8.8* 8.9    Liver Function Tests:  Recent Labs Lab 11/04/16 0217  AST 25  ALT 15*  ALKPHOS 69  BILITOT 0.7  PROT 5.9*  ALBUMIN 2.8*   No results for input(s): LIPASE, AMYLASE in the last 168 hours. No results for input(s): AMMONIA in the last 168 hours. Coagulation Profile:  Recent Labs  Lab 11/03/16 1155  INR 1.02   Cardiac Enzymes: No results for input(s): CKTOTAL, CKMB, CKMBINDEX, TROPONINI in the last 168 hours. BNP (last 3 results) No results for input(s): PROBNP in the last 8760 hours.  CBG: No results for input(s): GLUCAP in the last 168 hours.  Studies: No results found.   Scheduled Meds: . amLODipine  10 mg Oral q morning - 10a  . ARIPiprazole  15 mg Oral Daily  . aspirin EC  81 mg Oral Daily  . atenolol  25 mg Oral QHS  .  ceFAZolin (ANCEF) IV  2 g Intravenous Q8H  . clonazePAM  1 mg Oral BID  . enoxaparin (LOVENOX) injection  40 mg Subcutaneous Q24H  . FLUoxetine  20 mg Oral Daily  . gabapentin  100 mg Oral TID  . ketorolac  30 mg Intravenous Q6H  . lamoTRIgine  200 mg Oral q morning - 10a  . lisinopril  5 mg Oral Daily  . methocarbamol  500 mg Oral TID  . tamsulosin  0.4 mg Oral QPC supper   Continuous Infusions:  PRN Meds: acetaminophen **OR** acetaminophen, albuterol, hydrALAZINE, oxyCODONE-acetaminophen, polyethylene glycol, zolpidem  Time spent: 30 minutes  Author: Berle Mull, MD [facility] Pager: [phone] 11/07/2016 7:35 PM  If 7PM-7AM, please contact night-coverage at [email or web address], password [facility]

## 2016-11-07 NOTE — Care Management Note (Signed)
Case Management Note  Patient Details  Name: Travis Salinas MRN: [id number] Date of Birth: Sep 12, 1950  Subjective/Objective:  66 yr old male admitted with Left ankle abscess, anterior muscular compartment abscess, infectious tenosynovitis of tibialis anterior and extensor digitorum longus,  Left ankle effusion.                  Action/Plan:  Case manager spoke with patient concerning discharge plan. Patient states Dr. Linus Salmons with Infectious disease said that he will need to go to SNF for IV antibiotics. CM spoke with Education officer, museum. CM will continue to monitor doe discharge plan.    Expected Discharge Date:    pending              Expected Discharge Plan:  Skilled Nursing Facility  In-House Referral:  NA, Clinical Social Work  Discharge planning Services  CM Consult  Post Acute Care Choice:    Choice offered to:     DME Arranged:    DME Agency:  NA  HH Arranged:  NA HH Agency:     Status of Service:  In process, will continue to follow  If discussed at Long Length of Stay Meetings, dates discussed:    Additional Comments:  Ninfa Meeker, RN 11/07/2016, 11:39 AM

## 2016-11-08 LAB — CBC
HEMATOCRIT: 39.5 % (ref 39.0–52.0)
Hemoglobin: 12.5 g/dL — ABNORMAL LOW (ref 13.0–17.0)
MCH: 26.5 pg (ref 26.0–34.0)
MCHC: 31.6 g/dL (ref 30.0–36.0)
MCV: 83.9 fL (ref 78.0–100.0)
Platelets: 435 10*3/uL — ABNORMAL HIGH (ref 150–400)
RBC: 4.71 MIL/uL (ref 4.22–5.81)
RDW: 14.5 % (ref 11.5–15.5)
WBC: 14 10*3/uL — AB (ref 4.0–10.5)

## 2016-11-08 LAB — CULTURE, BLOOD (ROUTINE X 2)
Culture: NO GROWTH
Culture: NO GROWTH

## 2016-11-08 LAB — AEROBIC/ANAEROBIC CULTURE W GRAM STAIN (SURGICAL/DEEP WOUND)

## 2016-11-08 LAB — BASIC METABOLIC PANEL
ANION GAP: 8 (ref 5–15)
BUN: 14 mg/dL (ref 6–20)
CO2: 27 mmol/L (ref 22–32)
Calcium: 9.2 mg/dL (ref 8.9–10.3)
Chloride: 103 mmol/L (ref 101–111)
Creatinine, Ser: 1.17 mg/dL (ref 0.61–1.24)
GFR calc Af Amer: >60 mL/min (ref >60)
GFR calc non Af Amer: >60 mL/min (ref >60)
GLUCOSE: 98 mg/dL (ref 65–99)
POTASSIUM: 3.5 mmol/L (ref 3.5–5.1)
Sodium: 138 mmol/L (ref 135–145)

## 2016-11-08 LAB — AEROBIC/ANAEROBIC CULTURE (SURGICAL/DEEP WOUND)

## 2016-11-08 MED ORDER — FLUOXETINE HCL 20 MG PO CAPS: 20.0000 mg | ORAL_CAPSULE | Freq: Every day | ORAL | 0 refills | Status: DC

## 2016-11-08 MED ORDER — CEFAZOLIN SODIUM-DEXTROSE 2-4 GM/100ML-% IV SOLN: 2.0000 g | Freq: Three times a day (TID) | INTRAVENOUS | 14 refills | Status: DC

## 2016-11-08 MED ORDER — OXYCODONE-ACETAMINOPHEN 5-325 MG PO TABS: 1.0000 | ORAL_TABLET | Freq: Four times a day (QID) | ORAL | 0 refills | Status: DC | PRN

## 2016-11-08 MED ORDER — GABAPENTIN 100 MG PO CAPS: 100.0000 mg | ORAL_CAPSULE | Freq: Three times a day (TID) | ORAL | 0 refills | Status: DC

## 2016-11-08 MED ORDER — POLYETHYLENE GLYCOL 3350 17 G PO PACK: 17.0000 g | PACK | Freq: Every day | ORAL | 0 refills | Status: DC | PRN

## 2016-11-08 MED ORDER — METHOCARBAMOL 500 MG PO TABS: 500.0000 mg | ORAL_TABLET | Freq: Three times a day (TID) | ORAL | 0 refills | Status: DC | PRN

## 2016-11-08 MED ORDER — SODIUM CHLORIDE 0.9% FLUSH
10.0000 mL | INTRAVENOUS | Status: DC | PRN
  Administered 2016-11-09 (×2): 10 mL
  Filled 2016-11-08 (×2): qty 40

## 2016-11-08 MED ORDER — CLONAZEPAM 1 MG PO TABS: 1.0000 mg | ORAL_TABLET | Freq: Two times a day (BID) | ORAL | 0 refills | Status: DC | PRN

## 2016-11-08 NOTE — Progress Notes (Signed)
Physical Therapy Treatment Patient Details Name: Travis Salinas MRN: [id number] DOB: [birth year] Today's Date: 11/08/2016    History of Present Illness Pt is a 66 yo male admitted through ED for left leg abscess. Pt was being treated for gout initally since 11/01/26 and then returned to ED in [place] and was transferred to [facility] on 11/03/16 where he underwent a debridement of skin, muscle, tendon, fascia and bone, arthrotomy of left ankle and complex wound repair. Pt was found to have leukocytosis, ARF, and cellulitis. PMH significant for HTN, COPD, Hep C, and has a history of IV drug abuse.     PT Comments    Pt presents with continued discomfort with weight bearing. Pt is able to perform gait with supervision to min guard with cam walker in place and Rw. Pt continues to progress well with therapy will benefit from continued skilled PT in order to maximize functional outcomes.     Follow Up Recommendations  SNF;Other (comment) (For IV antiobotics)     Equipment Recommendations  None recommended by PT    Recommendations for Other Services       Precautions / Restrictions Precautions Precautions: None Required Braces or Orthoses: Other Brace/Splint Other Brace/Splint: cam walker LLE Restrictions Weight Bearing Restrictions: Yes LLE Weight Bearing: Weight bearing as tolerated    Mobility  Bed Mobility Overal bed mobility: Modified Independent             General bed mobility comments: Able to get OOB without any assistance  Transfers Overall transfer level: Needs assistance Equipment used: Rolling walker (2 wheeled) Transfers: Sit to/from Stand Sit to Stand: Supervision         General transfer comment: supervision for safety   Ambulation/Gait Ambulation/Gait assistance: Supervision;Min guard Ambulation Distance (Feet): 500 Feet Assistive device: Rolling walker (2 wheeled) Gait Pattern/deviations: Step-through pattern;Antalgic Gait velocity:  decreased Gait velocity interpretation: Below normal speed for age/gender General Gait Details: Mild antalgic gait with cam walker on LLE.    Stairs            Wheelchair Mobility    Modified Rankin (Stroke Patients Only)       Balance Overall balance assessment: No apparent balance deficits (not formally assessed)                                  Cognition Arousal/Alertness: Awake/alert Behavior During Therapy: WFL for tasks assessed/performed Overall Cognitive Status: Within Functional Limits for tasks assessed                      Exercises      General Comments        Pertinent Vitals/Pain Pain Assessment: 0-10 Pain Score: 7  Pain Location: R ankle with weight bearing.   Pain Descriptors / Indicators: Throbbing;Sharp Pain Intervention(s): Monitored during session;Repositioned    Home Living                      Prior Function            PT Goals (current goals can now be found in the care plan section) Acute Rehab PT Goals Patient Stated Goal: to not have pain with walking Progress towards PT goals: Progressing toward goals    Frequency    Min 3X/week      PT Plan Discharge plan needs to be updated    Co-evaluation  End of Session Equipment Utilized During Treatment: Gait belt Activity Tolerance: Patient tolerated treatment well Patient left: in chair;with call bell/phone within reach Nurse Communication: Mobility status PT Visit Diagnosis: Difficulty in walking, not elsewhere classified (R26.2);Pain Pain - Right/Left: Left Pain - part of body: Leg     Time: 1335-1350 PT Time Calculation (min) (ACUTE ONLY): 15 min  Charges:  $Gait Training: 8-22 mins                    G Codes:       Scheryl Marten PT, DPT  [phone]  11/08/2016, 1:54 PM

## 2016-11-08 NOTE — NC FL2 (Signed)
[place] LEVEL OF CARE SCREENING TOOL     IDENTIFICATION  Patient Name: Travis Salinas Birthdate: 04-05-1951 Sex: male Admission Date (Current Location): 11/03/2016  [facility] and [place] Number:  Herbalist and Address:  [facility], [place] [address] [place], [place] [address]      Provider Number: [id number]  Attending Physician Name and Address:  Lavina Hamman, MD  Relative Name and Phone Number:       Current Level of Care: Hospital Recommended Level of Care: [place] Prior Approval Number:    Date Approved/Denied:   PASRR Number: [id number]  Discharge Plan: SNF (Administer IV medications)    Current Diagnoses: Patient Active Problem List   Diagnosis Date Noted  . Abscess 11/03/2016  . Acute renal failure (ARF) ([place]) 11/03/2016  . Leukocytosis 11/03/2016  . Cellulitis of left lower extremity   . IVDU (intravenous drug user)   . Abscess of ankle   . Liver fibrosis ([place]) 04/24/2016  . Chronic hepatitis C without hepatic coma ([place]) 04/19/2015  . Substance abuse 04/19/2015  . Neck pain, chronic 04/19/2015  . Cellulitis 11/12/2013  . HTN (hypertension) 11/12/2013  . Depression 11/12/2013  . Abscess of calf, right 11/12/2013    Orientation RESPIRATION BLADDER Height & Weight     Self, Time, Situation, Place  Normal Continent Weight: 142 lb 11.2 oz (64.7 kg) Height:  5\' 10"  (177.8 cm)  BEHAVIORAL SYMPTOMS/MOOD NEUROLOGICAL BOWEL NUTRITION STATUS      Continent  (Please see discharge summary)  AMBULATORY STATUS COMMUNICATION OF NEEDS Skin   Limited Assist Verbally Surgical wounds (Closed incision left leg, compression wrapped.)                       Personal Care Assistance Level of Assistance  Bathing, Feeding, Dressing Bathing Assistance: Limited assistance Feeding assistance: Independent Dressing Assistance: Limited assistance     Functional Limitations Info  Sight, Hearing,  Speech Sight Info: Adequate Hearing Info: Adequate Speech Info: Adequate    SPECIAL CARE FACTORS FREQUENCY  PT (By licensed PT), OT (By licensed OT)     PT Frequency: 3x week OT Frequency: 3x week            Contractures Contractures Info: Not present    Additional Factors Info  Code Status, Allergies Code Status Info: Full code Allergies Info: Propoxyphene, Ciprofloxacin, Depakote Divalproex Sodium           Current Medications (11/08/2016):  This is the current hospital active medication list Current Facility-Administered Medications  Medication Dose Route Frequency Provider Last Rate Last Dose  . acetaminophen (TYLENOL) tablet 650 mg  650 mg Oral Q6H PRN Jani Gravel, MD   650 mg at 11/07/16 1347   Or  . acetaminophen (TYLENOL) suppository 650 mg  650 mg Rectal Q6H PRN Jani Gravel, MD      . albuterol (PROVENTIL) (2.5 MG/3ML) 0.083% nebulizer solution 3 mL  3 mL Inhalation Q6H PRN Jani Gravel, MD      . amLODipine (NORVASC) tablet 10 mg  10 mg Oral q morning - 10a Jani Gravel, MD   10 mg at 11/08/16 0802  . ARIPiprazole (ABILIFY) tablet 15 mg  15 mg Oral Daily Jani Gravel, MD   15 mg at 11/08/16 0802  . aspirin EC tablet 81 mg  81 mg Oral Daily Jani Gravel, MD   81 mg at 11/08/16 0801  . atenolol (TENORMIN) tablet 25 mg  25 mg Oral QHS Jani Gravel, MD   25 mg at 11/07/16 2050  . ceFAZolin (ANCEF) IVPB 2g/100 mL premix  2 g Intravenous Q8H Carlyle Basques, MD   2 g at 11/08/16 0506  . clonazePAM (KLONOPIN) tablet 1 mg  1 mg Oral BID Jani Gravel, MD   1 mg at 11/08/16 0803  . enoxaparin (LOVENOX) injection 40 mg  40 mg Subcutaneous Q24H Jani Gravel, MD   40 mg at 11/07/16 2049  . FLUoxetine (PROZAC) capsule 20 mg  20 mg Oral Daily Jani Gravel, MD   20 mg at 11/08/16 0803  . gabapentin (NEURONTIN) capsule 100 mg  100 mg Oral TID Lavina Hamman, MD   100 mg at 11/08/16 0803  . hydrALAZINE (APRESOLINE) injection 10 mg  10 mg Intravenous Q6H PRN Jani Gravel, MD      . ketorolac (TORADOL) 30 MG/ML  injection 30 mg  30 mg Intravenous Q6H Lavina Hamman, MD   30 mg at 11/08/16 1118  . lamoTRIgine (LAMICTAL) tablet 200 mg  200 mg Oral q morning - 10a Jani Gravel, MD   200 mg at 11/08/16 0801  . lisinopril (PRINIVIL,ZESTRIL) tablet 5 mg  5 mg Oral Daily Lavina Hamman, MD   5 mg at 11/08/16 0802  . methocarbamol (ROBAXIN) tablet 500 mg  500 mg Oral TID Lavina Hamman, MD   500 mg at 11/08/16 0802  . oxyCODONE-acetaminophen (PERCOCET/ROXICET) 5-325 MG per tablet 1 tablet  1 tablet Oral Q4H PRN Lavina Hamman, MD   1 tablet at 11/08/16 1246  . polyethylene glycol (MIRALAX / GLYCOLAX) packet 17 g  17 g Oral Daily PRN Lavina Hamman, MD      . sodium chloride flush (NS) 0.9 % injection 10-40 mL  10-40 mL Intracatheter PRN Lavina Hamman, MD      . tamsulosin [facility]) capsule 0.4 mg  0.4 mg Oral QPC supper Jani Gravel, MD   0.4 mg at 11/07/16 1648  . zolpidem (AMBIEN) tablet 5 mg  5 mg Oral QHS PRN Gardiner Barefoot, NP   5 mg at 11/07/16 2050     Discharge Medications: Please see discharge summary for a list of discharge medications.  Relevant Imaging Results:  Relevant Lab Results:   Additional Information SSN: [id number]  Alla German, LCSW

## 2016-11-08 NOTE — Progress Notes (Signed)
[facility] Progress Note  Patient: Travis Salinas [id number]   PCP: [name], MD DOB: 1950-09-14   DOA: 11/03/2016   DOS: 11/08/2016   Date of Service: the patient was seen and examined on 11/08/2016   Subjective: feeling better, Improvement in burning pain in the leg.  Brief hospital course: Pt. with PMH of hypertension, polysubstance abuse with heroin and cocaine; admitted on 11/03/2016, with complaint of left ankle pain and swelling, was found to have cellulitis with left ankle abscess. S/P debridement of the left lower leg, arthrotomy. Currently has a JP drain Currently further plan is continue JP drain for 2-3 days per surgery.  Assessment and Plan: 1. Left ankle cellulitis with abscess. S/P debridement of the left lower leg and arthrotomy, currently has a JP drain. Staph aureus and group C strep in culture, also possible anaerobe.  Orthopedics currently following, recommends to continue JP drain for today. polymicrobial infection based on the cultures. MSSA in deep wounds. ID consulted. Was on vancomycin and Zosyn. Now on cefazolin per ID.  Now has PICC line. Will be transitioned to SNF for completion of IV antibiotics for 2 weeks. Last dose 11/20/2016 JP tube removal, follow-up with orthopedics in 2 weeks. For pain management continuing the patient on scheduled Toradol, scheduled Robaxin. Complaining about burning pain, I will add gabapentin.  2. Essential hypertension. Continuing home regimen.  3. Mood disorder. Continuing home regimen of Klonopin, Prozac, Abilify, Lamictal.  4. IV drug abuse. UDS is positive for opiates, cocaine, cannabis use. counseled for quitting drug abuse due to severe infection at present. Social worker consulted.  Bowel regimen: last BM 11/07/2016 Diet: Cardiac diet DVT Prophylaxis: subcutaneous Heparin  Advance goals of care discussion: Full code  Family Communication: no family was present at bedside, at the time of  interview.  Disposition:  Discharge to SNF tomorrow  Consultants: Orthopedics, ID Procedures: Incision and debridement  Antibiotics: Anti-infectives    Start     Dose/Rate Route Frequency Ordered Stop   11/08/16 0000  ceFAZolin (ANCEF) 2-4 GM/100ML-% IVPB     2 g 200 mL/hr over 30 Minutes Intravenous Every 8 hours 11/08/16 1559 11/20/16 2359   11/06/16 2200  ceFAZolin (ANCEF) IVPB 2g/100 mL premix     2 g 200 mL/hr over 30 Minutes Intravenous Every 8 hours 11/06/16 1544     11/04/16 2300  vancomycin (VANCOCIN) IVPB 1000 mg/200 mL premix  Status:  Discontinued     1,000 mg 200 mL/hr over 60 Minutes Intravenous Every 12 hours 11/04/16 1010 11/06/16 1543   11/04/16 2200  piperacillin-tazobactam (ZOSYN) IVPB 3.375 g  Status:  Discontinued     3.375 g 12.5 mL/hr over 240 Minutes Intravenous Every 8 hours 11/04/16 1901 11/06/16 1543   11/04/16 1015  vancomycin (VANCOCIN) 500 mg in sodium chloride 0.9 % 100 mL IVPB     500 mg 100 mL/hr over 60 Minutes Intravenous STAT 11/04/16 1010 11/04/16 1225   11/04/16 0700  vancomycin (VANCOCIN) 500 mg in sodium chloride 0.9 % 100 mL IVPB  Status:  Discontinued     500 mg 100 mL/hr over 60 Minutes Intravenous Every 12 hours 11/03/16 1857 11/04/16 1010   11/03/16 1938  vancomycin (VANCOCIN) 1-5 GM/200ML-% IVPB    Comments:  Valda Lamb   : cabinet override      11/03/16 1938 11/04/16 0744   11/03/16 1900  piperacillin-tazobactam (ZOSYN) IVPB 3.375 g  Status:  Discontinued     3.375 g 12.5 mL/hr over 240 Minutes Intravenous  Every 8 hours 11/03/16 1857 11/04/16 1901   11/03/16 1900  vancomycin (VANCOCIN) 1,500 mg in sodium chloride 0.9 % 500 mL IVPB  Status:  Discontinued     1,500 mg 250 mL/hr over 120 Minutes Intravenous  Once 11/03/16 1858 11/04/16 1010   11/03/16 1320  piperacillin-tazobactam (ZOSYN) 3.375 GM/50ML IVPB    Comments:  Modena Morrow   : cabinet override      11/03/16 1320 11/03/16 1509   11/03/16 1245  piperacillin-tazobactam  (ZOSYN) IVPB 4.5 g  Status:  Discontinued     4.5 g 200 mL/hr over 30 Minutes Intravenous  Once 11/03/16 1235 11/03/16 1355   11/03/16 1245  vancomycin (VANCOCIN) IVPB 1000 mg/200 mL premix     1,000 mg 200 mL/hr over 60 Minutes Intravenous  Once 11/03/16 1235 11/03/16 1600      Objective: Physical Exam: Vitals:   11/07/16 1218 11/07/16 1358 11/07/16 2029 11/08/16 0500  BP: 131/61 135/73 (!) 143/75 (!) 146/69  Pulse: 68 69 63 63  Resp: 18 18 18 18   Temp: 98.3 F (36.8 C) 98.4 F (36.9 C) 98.2 F (36.8 C) 97.5 F (36.4 C)  TempSrc: Oral Oral Oral Oral  SpO2: 98% 98% 98% 97%  Weight:      Height:        Intake/Output Summary (Last 24 hours) at 11/08/16 1928 Last data filed at 11/08/16 1120  Gross per 24 hour  Intake              540 ml  Output             1200 ml  Net             -660 ml   Filed Weights   11/05/16 0652 11/06/16 0402 11/07/16 0533  Weight: 66.4 kg (146 lb 6.4 oz) 66.1 kg (145 lb 12.8 oz) 64.7 kg (142 lb 11.2 oz)    General: Alert, Awake and Oriented to Time, Place and Person. Appear in mild distress, affect appropriate Eyes: PERRL, Conjunctiva normal ENT: Oral Mucosa clear moist. Neck: no JVD, no Abnormal Mass Or lumps Cardiovascular: S1 and S2 Present, no Murmur, Respiratory: Bilateral Air entry equal and Decreased, no use of accessory muscle, Clear to Auscultation, no Crackles, no wheezes Abdomen: Bowel Sound present, Soft and no tenderness Skin: no redness, no Rash, no induration Extremities: Left leg in brace, no Pedal edema, no calf tenderness Neurologic: Grossly no focal neuro deficit. Bilaterally Equal motor strength  Data Reviewed: CBC:  Recent Labs Lab 11/03/16 1109 11/04/16 0217 11/05/16 0604 11/06/16 0455 11/07/16 0543 11/08/16 0418  WBC 25.3* 23.5* 12.7* 13.2* 13.2* 14.0*  NEUTROABS 21.8*  --   --   --   --   --   HGB 13.5 11.1* 10.8* 11.9* 12.0* 12.5*  HCT 40.1 34.3* 34.0* 37.6* 37.6* 39.5  MCV 80.0 82.3 81.7 81.7 82.1 83.9    PLT 284 308 323 392 407* [id number]*   Basic Metabolic Panel:  Recent Labs Lab 11/04/16 0217 11/05/16 0604 11/06/16 0455 11/07/16 0543 11/08/16 0418  NA 137 139 138 138 138  K 4.1 3.5 3.4* 3.7 3.5  CL 106 106 104 106 103  CO2 23 25 25 24 27   GLUCOSE 162* 92 103* 118* 98  BUN 30* 18 16 14 14   CREATININE 1.11 1.05 1.13 1.10 1.17  CALCIUM 8.4* 8.2* 8.8* 8.9 9.2    Liver Function Tests:  Recent Labs Lab 11/04/16 0217  AST 25  ALT 15*  ALKPHOS 69  BILITOT  0.7  PROT 5.9*  ALBUMIN 2.8*   No results for input(s): LIPASE, AMYLASE in the last 168 hours. No results for input(s): AMMONIA in the last 168 hours. Coagulation Profile:  Recent Labs Lab 11/03/16 1155  INR 1.02   Cardiac Enzymes: No results for input(s): CKTOTAL, CKMB, CKMBINDEX, TROPONINI in the last 168 hours. BNP (last 3 results) No results for input(s): PROBNP in the last 8760 hours.  CBG: No results for input(s): GLUCAP in the last 168 hours.  Studies: No results found.   Scheduled Meds: . amLODipine  10 mg Oral q morning - 10a  . ARIPiprazole  15 mg Oral Daily  . aspirin EC  81 mg Oral Daily  . atenolol  25 mg Oral QHS  .  ceFAZolin (ANCEF) IV  2 g Intravenous Q8H  . clonazePAM  1 mg Oral BID  . enoxaparin (LOVENOX) injection  40 mg Subcutaneous Q24H  . FLUoxetine  20 mg Oral Daily  . gabapentin  100 mg Oral TID  . ketorolac  30 mg Intravenous Q6H  . lamoTRIgine  200 mg Oral q morning - 10a  . lisinopril  5 mg Oral Daily  . methocarbamol  500 mg Oral TID  . tamsulosin  0.4 mg Oral QPC supper   Continuous Infusions:  PRN Meds: acetaminophen **OR** acetaminophen, albuterol, hydrALAZINE, oxyCODONE-acetaminophen, polyethylene glycol, sodium chloride flush, zolpidem  Time spent: 30 minutes  Author: Berle Mull, MD [facility] Pager: [phone] 11/08/2016 7:28 PM  If 7PM-7AM, please contact night-coverage at [email or web address], password [facility]

## 2016-11-08 NOTE — Progress Notes (Signed)
Peripherally Inserted Central Catheter/Midline Placement  The IV Nurse has discussed with the patient and/or persons authorized to consent for the patient, the purpose of this procedure and the potential benefits and risks involved with this procedure.  The benefits include less needle sticks, lab draws from the catheter, and the patient may be discharged home with the catheter. Risks include, but not limited to, infection, bleeding, blood clot (thrombus formation), and puncture of an artery; nerve damage and irregular heartbeat and possibility to perform a PICC exchange if needed/ordered by physician.  Alternatives to this procedure were also discussed.  [employer] PICC patient education guide, fact sheet on infection prevention and patient information card has been provided to patient /or left at bedside.    PICC/Midline Placement Documentation        Travis Salinas 11/08/2016, 9:24 AM Consent obtained by Jule Economy, RN

## 2016-11-08 NOTE — Clinical Social Work Note (Signed)
CSW is searching for a SNF bed for pt in order for facility to administer pt's IV medication. CSW has contact [facility], [place], and [facility]. Pt's wife updated. CSW will continue to search for placement.   [address], [place]

## 2016-11-08 NOTE — Clinical Social Work Note (Signed)
Pt is agreeable and will go to [facility] tomorrow. Pt will need transport. Pt will be a LOG. Facility prepared to take pt in the AM. CSW faxed prescriptions on 3/2. MD notified of placement.  [address] [place]

## 2016-11-09 DIAGNOSIS — M659 Synovitis and tenosynovitis, unspecified: Secondary | ICD-10-CM | POA: Diagnosis present

## 2016-11-09 DIAGNOSIS — M65972 Unspecified synovitis and tenosynovitis, left ankle and foot: Secondary | ICD-10-CM | POA: Diagnosis present

## 2016-11-09 DIAGNOSIS — M009 Pyogenic arthritis, unspecified: Secondary | ICD-10-CM | POA: Diagnosis present

## 2016-11-09 MED ORDER — HEPARIN SOD (PORK) LOCK FLUSH 100 UNIT/ML IV SOLN
250.0000 [IU] | INTRAVENOUS | Status: AC | PRN
  Administered 2016-11-09: 250 [IU]

## 2016-11-09 MED ORDER — SULFAMETHOXAZOLE-TRIMETHOPRIM 800-160 MG PO TABS: 1.0000 | ORAL_TABLET | Freq: Two times a day (BID) | ORAL | 0 refills | Status: AC

## 2016-11-09 MED ORDER — RIFAMPIN 300 MG PO CAPS: 300.0000 mg | ORAL_CAPSULE | Freq: Two times a day (BID) | ORAL | 0 refills | Status: DC

## 2016-11-09 MED ORDER — CEFAZOLIN SODIUM-DEXTROSE 2-4 GM/100ML-% IV SOLN
2.0000 g | Freq: Three times a day (TID) | INTRAVENOUS | Status: DC
Start: 2016-11-09 — End: 2016-11-09
  Administered 2016-11-09: 2 g via INTRAVENOUS
  Filled 2016-11-09: qty 100

## 2016-11-09 MED ORDER — CEFAZOLIN IV (FOR PTA / DISCHARGE USE ONLY): 2.0000 g | Freq: Three times a day (TID) | INTRAVENOUS | 0 refills | Status: AC

## 2016-11-09 NOTE — Clinical Social Work Note (Signed)
CSW facilitated patient discharge including contacting patient family and facility to confirm patient discharge plans. Clinical information faxed to facility and family agreeable with plan. CSW arranged ambulance transport via [facility] ([facility] after hours) to [facility] at 1:00. RN to call report prior to discharge [phone]).  CSW will sign off for now as social work intervention is no longer needed. Please consult [place] again if new needs arise.  [name], [place]

## 2016-11-09 NOTE — Discharge Instructions (Signed)
[facility] [address] [place], [place] [address] [phone]

## 2016-11-09 NOTE — Discharge Summary (Addendum)
[facility] Discharge Summary   Patient: Travis Salinas TLX:[id number]   PCP: [name], MD DOB: May 19, 1951   Date of admission: 11/03/2016   Date of discharge:  11/09/2016    Discharge Diagnoses:  Principal Problem:   Tenosynovitis of left ankle Active Problems:   HTN (hypertension)   Chronic hepatitis C without hepatic coma (HCC)   Abscess   Acute renal failure (ARF) (HCC)   Leukocytosis   Cellulitis of left lower extremity   IVDU (intravenous drug user)   Abscess of ankle   Septic arthritis of ankle ([place])  Admitted From: home Disposition:  SNF  Recommendations for Outpatient Follow-up:  1. Please follow up with orthopedics and PCP in 2 week    Contact information for follow-up providers    Eduard Roux, MD Follow up in 2 week(s).   Specialty:  Orthopedic Surgery Contact information: [place] [address] [phone]        [name], MD. Schedule an appointment as soon as possible for a visit in 1 week(s).   Specialty:  Family Medicine Contact information: [place] [address] [phone]        Scharlene Gloss, MD. Schedule an appointment as soon as possible for a visit in 2 week(s).   Specialty:  Infectious Diseases Contact information: [address] [place] [address] [phone]            Contact information for after-discharge care    [facility] Follow up.   Specialty:  [place] information: [address] [place] [phone]                 Diet recommendation: cardiac died  Activity: The patient is advised to gradually reintroduce usual activities.  Discharge Condition: good  Code Status: full code  History of present illness: As per the H and P dictated on admission, "Travis Salinas  is a 66 y.o. male,  w hypertension, heroine, coccaine abuse, who presents w  complaint of left foot pain since this past Wednesday,  Pt denies redness at that time.  Pt states pain was worse and therefore seen in ER on Friday and tx with prednisone and colchicine for gout attack.  Pt states redness might have begun on Thursday and denies any needle or injection.  Pt presented to [facility] today due to increase in pain as well as redness.    In ED, pt noted to have abscess on the dorsum of the left foot, I and D performed.  ED apparently spoke with orthopedics who will consult on the case.  Pt given vanco and zosyn iv in ED, and cultures were obtained. Xray negative.  Pt will be admitted for cellulitis and abscess of the left foot. "  Hospital Course:   Summary of his active problems in the hospital is as following. 1. Left ankle cellulitis with abscess.    Septic arthritis of ankle, Tenosynovitis of left ankle S/P debridement of the left lower leg and arthrotomy, currently has a JP drain. Staph aureus and group C strep in culture, also possible anaerobe.  Orthopedics currently following, recommends to continue JP drain for today. polymicrobial infection based on the cultures. MSSA in deep wounds. ID consulted. Was on vancomycin and Zosyn. Now on cefazolin per ID.  Now has PICC line. Will be transitioned to SNF for completion of IV antibiotics for 2 weeks. Last  dose 11/20/2016 JP tube removed 11/07/2016, follow-up with orthopedics in 2 weeks. For pain management continuing the patient on  scheduled Robaxin. Complaining about burning pain, I will add gabapentin. After 2 weeks, can change to high dose bactrim orally and rifampin 300 mg bid for another 3-4 weeks Remove picc line at discharge from SNF.  2. Essential hypertension. Continuing home regimen.  3. Mood disorder. Continuing home regimen of Klonopin, Prozac, Abilify, Lamictal.  4. IV drug abuse. UDS is positive for opiates, cocaine, cannabis use. counseled for quitting drug abuse due to severe  infection at present.  Social worker consulted. Pt currently willing and denies any help needed.   All other chronic medical condition were stable during the hospitalization.  Patient was seen by physical therapy, who recommended home health, due to his h/o IVDA pt was Recommended to go to SNF while receiving IV Antibiotics, which was arranged by Education officer, museum and case Freight forwarder. On the day of the discharge the patient's vitals were stable, and no other acute medical condition were reported by patient. the patient was felt safe to be discharge at SNF with therapy.  Procedures and Results: 1. Debridement of skin, muscle, tendon, fascia, and bone of left lower leg, 30 x 5 x 5 cm (750 sq cm) 2. Arthrotomy of left ankle joint for exploration of effusion 3. Complex wound repair of left lower leg 30 cm  4. PICC line placement   Consultations:  Orthopedics  Infectious disease  DISCHARGE MEDICATION: Current Discharge Medication List    START taking these medications   Details  ceFAZolin (ANCEF) IVPB Inject 2 g into the vein every 8 (eight) hours. Indication:  Wound Infection Last Day of Therapy:  11/20/2016 Labs - Once weekly:  CBC/D and BMP, Labs - Every other week:  ESR and CRP Qty: 33 Units, Refills: 0    gabapentin (NEURONTIN) 100 MG capsule Take 1 capsule (100 mg total) by mouth 3 (three) times daily. Qty: 90 capsule, Refills: 0    methocarbamol (ROBAXIN) 500 MG tablet Take 1 tablet (500 mg total) by mouth every 8 (eight) hours as needed for muscle spasms. Qty: 15 tablet, Refills: 0    oxyCODONE-acetaminophen (PERCOCET/ROXICET) 5-325 MG tablet Take 1 tablet by mouth every 6 (six) hours as needed for severe pain. Qty: 20 tablet, Refills: 0    polyethylene glycol (MIRALAX / GLYCOLAX) packet Take 17 g by mouth daily as needed for mild constipation. Qty: 14 each, Refills: 0    rifampin (RIFADIN) 300 MG capsule Take 1 capsule (300 mg total) by mouth 2 (two) times daily. Qty: 60  capsule, Refills: 0    sulfamethoxazole-trimethoprim (BACTRIM DS,SEPTRA DS) 800-160 MG tablet Take 1 tablet by mouth 2 (two) times daily. Qty: 60 tablet, Refills: 0      CONTINUE these medications which have CHANGED   Details  clonazePAM (KLONOPIN) 1 MG tablet Take 1 tablet (1 mg total) by mouth 2 (two) times daily as needed for anxiety. Qty: 4 tablet, Refills: 0    FLUoxetine (PROZAC) 20 MG capsule Take 1 capsule (20 mg total) by mouth daily. Qty: 30 capsule, Refills: 0      CONTINUE these medications which have NOT CHANGED   Details  amLODipine (NORVASC) 10 MG tablet Take 10 mg by mouth every morning.    ARIPiprazole (ABILIFY) 15 MG tablet Take 15 mg by mouth daily. Refills: 5    aspirin EC 81 MG tablet Take 1 tablet (81 mg total) by mouth daily. Qty: 90 tablet, Refills:  3    atenolol (TENORMIN) 50 MG tablet Take 25 mg by mouth daily. Refills: 0    lamoTRIgine (LAMICTAL) 200 MG tablet Take 200 mg by mouth every morning.    RAPAFLO 8 MG CAPS capsule Take 8 mg by mouth daily. Refills: 3    naproxen (NAPROSYN) 500 MG tablet Take 500 mg by mouth 2 (two) times daily as needed for pain. Refills: 0      STOP taking these medications     alprazolam (XANAX) 2 MG tablet      carisoprodol (SOMA) 250 MG tablet      PROAIR HFA 108 (90 Base) MCG/ACT inhaler        Allergies  Allergen Reactions  . Propoxyphene Nausea And Vomiting  . Ciprofloxacin Nausea Only  . Depakote [Divalproex Sodium] Other (See Comments)    hallucinations   Discharge Instructions    AMB Referral to [place] Management    Complete by:  As directed    Please assign UMR member for post discharge call. Currently at [facility]. [name], [place], [facility] [phone]   Reason for consult:  Please assign UMR member for post discharge   Expected date of contact:  1-3 days (reserved for hospital discharges)   Diet - low sodium heart healthy    Complete by:  As  directed    Discharge instructions    Complete by:  As directed    It is important that you read following instructions as well as go over your medication list with RN to help you understand your care after this hospitalization.  Discharge Instructions: Please follow-up with PCP in one week  Please request your primary care physician to go over all Hospital Tests and Procedure/Radiological results at the follow up,  Please get all Hospital records sent to your PCP by signing hospital release before you go home.   Do not drive, operating heavy machinery, perform activities at heights, swimming or participation in water activities or provide baby sitting services while your are on Pain, Sleep and Anxiety Medications; until you have been seen by Primary Care Physician or a Neurologist and advised to do so again. Do not take more than prescribed Pain, Sleep and Anxiety Medications. You were cared for by a hospitalist during your hospital stay. If you have any questions about your discharge medications or the care you received while you were in the hospital after you are discharged, you can call the unit and ask to speak with the hospitalist on call if the hospitalist that took care of you is not available.  Once you are discharged, your primary care physician will handle any further medical issues. Please note that NO REFILLS for any discharge medications will be authorized once you are discharged, as it is imperative that you return to your primary care physician (or establish a relationship with a primary care physician if you do not have one) for your aftercare needs so that they can reassess your need for medications and monitor your lab values. You Must read complete instructions/literature along with all the possible adverse reactions/side effects for all the Medicines you take and that have been prescribed to you. Take any new Medicines after you have completely understood and accept all the  possible adverse reactions/side effects. Wear Seat belts while driving. If you have smoked or chewed Tobacco in the last 2 yrs please stop smoking and/or stop any Recreational drug use.   Home infusion instructions [facility] May follow  [facility] Dosing Protocol; May administer Cathflo as needed to maintain patency of vascular access device.; Flushing of vascular access device: per [facility] Protocol: 0.9% NaCl pre/post medica...    Complete by:  As directed    Instructions:  May follow [place] Dosing Protocol   Instructions:  May administer Cathflo as needed to maintain patency of vascular access device.   Instructions:  Flushing of vascular access device: per [facility] Protocol: 0.9% NaCl pre/post medication administration and prn patency; Heparin 100 u/ml, 74m for implanted ports and Heparin 10u/ml, 577mfor all other central venous catheters.   Instructions:  May follow [facility] Anaphylaxis Protocol for First Dose Administration in the home: 0.9% NaCl at 25-50 ml/hr to maintain IV access for protocol meds. Epinephrine 0.3 ml IV/IM PRN and Benadryl 25-50 IV/IM PRN s/s of anaphylaxis.   Instructions:  [facility] Coordinator (RN) to assist per patient IV care needs in the home PRN.   Increase activity slowly    Complete by:  As directed      Discharge Exam: Filed Weights   11/06/16 0402 11/07/16 0533 11/09/16 0536  Weight: 66.1 kg (145 lb 12.8 oz) 64.7 kg (142 lb 11.2 oz) 64.9 kg (143 lb 1.6 oz)   Vitals:   11/08/16 2229 11/09/16 0536  BP: (!) 156/69 (!) 161/73  Pulse: 63 60  Resp:    Temp: 97.5 F (36.4 C) 97.9 F (36.6 C)   General: Appear in no distress, no Rash; Oral Mucosa moist. Cardiovascular: S1 and S2 Present, no Murmur, no JVD Respiratory: Bilateral Air entry present and Clear to Auscultation, no Crackles, no wheezes Abdomen: Bowel Sound present, Soft and no tenderness Extremities: no Pedal edema, no calf tenderness Neurology: Grossly no focal neuro  deficit.  The results of significant diagnostics from this hospitalization (including imaging, microbiology, ancillary and laboratory) are listed below for reference.    Significant Diagnostic Studies: Dg Ankle Complete Left  Result Date: 11/03/2016 CLINICAL DATA:  Redness, warmth, and swelling of Left ankle. Left ankle pain is aching and burning, worse with ambulation. No recent injury. EXAM: LEFT ANKLE COMPLETE - 3+ VIEW COMPARISON:  None. FINDINGS: No fracture.  No bone lesion. The ankle mortise is normally spaced and aligned. No arthropathic change. There is soft tissue swelling which is most evident anteriorly. No soft tissue air. IMPRESSION: 1. No fracture, bone lesion or ankle joint abnormality. 2. Diffuse soft tissue swelling which predominates anteriorly. Electronically Signed   By: DaLajean Manes.D.   On: 11/03/2016 11:28   Us[place]al  Result Date: 11/04/2016 CLINICAL DATA:  Acute renal failure. EXAM: RENAL / URINARY TRACT ULTRASOUND COMPLETE COMPARISON:  None. FINDINGS: Right Kidney: Length: 12.4 cm. Echogenicity within normal limits. No mass or hydronephrosis visualized. Left Kidney: Length: 11.7 cm. Echogenicity within normal limits. Mild thinning of renal parenchyma No mass or hydronephrosis visualized. Bladder: Appears normal for degree of bladder distention. Both ureteral jets are visualized. IMPRESSION: 1. Mild left renal parenchymal thinning. 2. Otherwise unremarkable renal ultrasound. No hydronephrosis. Electronically Signed   By: MeJeb Levering.D.   On: 11/04/2016 00:35    Microbiology: Recent Results (from the past 240 hour(s))  Culture, blood (routine x 2)     Status: None   Collection Time: 11/03/16 11:05 AM  Result Value Ref Range Status   Specimen Description BLOOD LEFT UPPER ARM  Final   Special Requests BOTTLES DRAWN AEROBIC AND ANAEROBIC [id number]   Culture   Final    NO  GROWTH 5 DAYS Performed at [facility], [place] [address] [place], [place]  [address]    Report Status 11/08/2016 FINAL  Final  Culture, blood (routine x 2)     Status: None   Collection Time: 11/03/16 11:40 AM  Result Value Ref Range Status   Specimen Description BLOOD LEFT HAND  Final   Special Requests BOTTLES DRAWN AEROBIC AND ANAEROBIC 5CC EACH  Final   Culture   Final    NO GROWTH 5 DAYS Performed at [facility], [place] [address], [place], [place] [address]    Report Status 11/08/2016 FINAL  Final  Aerobic/Anaerobic Culture (surgical/deep wound)     Status: None   Collection Time: 11/03/16  1:25 PM  Result Value Ref Range Status   Specimen Description ANKLE LEFT  Final   Special Requests NONE  Final   Gram Stain   Final    MODERATE WBC PRESENT, PREDOMINANTLY PMN RARE SQUAMOUS EPITHELIAL CELLS PRESENT ABUNDANT GRAM POSITIVE COCCI IN PAIRS ABUNDANT GRAM NEGATIVE COCCOBACILLI FEW GRAM NEGATIVE RODS Performed at [facility], [place] [address] [place],  [address]    Culture   Final    MODERATE STREPTOCOCCUS GROUP C MIXED ANAEROBIC FLORA PRESENT.  CALL LAB IF FURTHER IID REQUIRED.    Report Status 11/07/2016 FINAL  Final  Aerobic/Anaerobic Culture (surgical/deep wound)     Status: None   Collection Time: 11/03/16  8:09 PM  Result Value Ref Range Status   Specimen Description ABSCESS LEFT ANKLE  Final   Special Requests ID A PATIENT ON FOLLOWING VANCOMYCIN  Final   Gram Stain   Final    MODERATE WBC PRESENT, PREDOMINANTLY PMN FEW GRAM POSITIVE COCCI IN CLUSTERS    Culture   Final    ABUNDANT STAPHYLOCOCCUS AUREUS NO ANAEROBES ISOLATED    Report Status 11/08/2016 FINAL  Final   Organism ID, Bacteria STAPHYLOCOCCUS AUREUS  Final      Susceptibility   Staphylococcus aureus - MIC*    CIPROFLOXACIN 2 INTERMEDIATE Intermediate     ERYTHROMYCIN >=8 RESISTANT Resistant     GENTAMICIN <=0.5 SENSITIVE Sensitive     OXACILLIN 0.5 SENSITIVE Sensitive     TETRACYCLINE <=1 SENSITIVE Sensitive     VANCOMYCIN 1 SENSITIVE Sensitive      TRIMETH/SULFA <=10 SENSITIVE Sensitive     CLINDAMYCIN <=0.25 SENSITIVE Sensitive     RIFAMPIN <=0.5 SENSITIVE Sensitive     Inducible Clindamycin NEGATIVE Sensitive     * ABUNDANT STAPHYLOCOCCUS AUREUS  Aerobic/Anaerobic Culture (surgical/deep wound)     Status: None   Collection Time: 11/03/16  8:20 PM  Result Value Ref Range Status   Specimen Description TISSUE LEFT ANKLE  Final   Special Requests SOFT TISSUE ID B PATIENT ON FOLLOWING VANCOMYCIN  Final   Gram Stain   Final    ABUNDANT WBC PRESENT, PREDOMINANTLY PMN ABUNDANT GRAM POSITIVE COCCI IN PAIRS IN CLUSTERS    Culture   Final    ABUNDANT STAPHYLOCOCCUS AUREUS NO ANAEROBES ISOLATED    Report Status 11/08/2016 FINAL  Final   Organism ID, Bacteria STAPHYLOCOCCUS AUREUS  Final      Susceptibility   Staphylococcus aureus - MIC*    CIPROFLOXACIN 2 INTERMEDIATE Intermediate     ERYTHROMYCIN >=8 RESISTANT Resistant     GENTAMICIN <=0.5 SENSITIVE Sensitive     OXACILLIN 0.5 SENSITIVE Sensitive     TETRACYCLINE <=1 SENSITIVE Sensitive     VANCOMYCIN 1 SENSITIVE Sensitive     TRIMETH/SULFA <=10  SENSITIVE Sensitive     CLINDAMYCIN <=0.25 SENSITIVE Sensitive     RIFAMPIN <=0.5 SENSITIVE Sensitive     Inducible Clindamycin NEGATIVE Sensitive     * ABUNDANT STAPHYLOCOCCUS AUREUS     Labs: CBC:  Recent Labs Lab 11/03/16 1109 11/04/16 0217 11/05/16 0604 11/06/16 0455 11/07/16 0543 11/08/16 0418  WBC 25.3* 23.5* 12.7* 13.2* 13.2* 14.0*  NEUTROABS 21.8*  --   --   --   --   --   HGB 13.5 11.1* 10.8* 11.9* 12.0* 12.5*  HCT 40.1 34.3* 34.0* 37.6* 37.6* 39.5  MCV 80.0 82.3 81.7 81.7 82.1 83.9  PLT 284 308 323 392 407* 291*   Basic Metabolic Panel:  Recent Labs Lab 11/04/16 0217 11/05/16 0604 11/06/16 0455 11/07/16 0543 11/08/16 0418  NA 137 139 138 138 138  K 4.1 3.5 3.4* 3.7 3.5  CL 106 106 104 106 103  CO2 _0 GLUCOSE 162* 92 103* 118* 98  BUN 30* _1 CREATININE 1.11 1.05 1.13 1.10 1.17   CALCIUM 8.4* 8.2* 8.8* 8.9 9.2   Liver Function Tests:  Recent Labs Lab 11/04/16 0217  AST 25  ALT 15*  ALKPHOS 69  BILITOT 0.7  PROT 5.9*  ALBUMIN 2.8*   Time spent: 30 minutes  Signed:  Iley Deignan  [facility]  11/09/2016  , 11:30 AM

## 2016-11-09 NOTE — Clinical Social Work Placement (Signed)
   CLINICAL SOCIAL WORK PLACEMENT  NOTE  Date:  11/09/2016  Patient Details  Name: Travis Salinas MRN: [id number] Date of Birth: 08-15-1951  Clinical Social Work is seeking post-discharge placement for this patient at the [place] level of care (*CSW will initial, date and re-position this form in  chart as items are completed):      Patient/family provided with [place] Work Department's list of facilities offering this level of care within the geographic area requested by the patient (or if unable, by the patient's family).      Patient/family informed of their freedom to choose among providers that offer the needed level of care, that participate in Medicare, Medicaid or managed care program needed by the patient, have an available bed and are willing to accept the patient.      Patient/family informed of [place]'s ownership interest in [facility] and [facility], as well as of the fact that they are under no obligation to receive care at these facilities.  PASRR submitted to EDS on       PASRR number received on       Existing PASRR number confirmed on       FL2 transmitted to all facilities in geographic area requested by pt/family on       FL2 transmitted to all facilities within larger geographic area on       Patient informed that his/her managed care company has contracts with or will negotiate with certain facilities, including the following:            Patient/family informed of bed offers received.  Patient chooses bed at [facility]     Physician recommends and patient chooses bed at      Patient to be transferred to [facility] on 11/09/16.  Patient to be transferred to facility by [facility] ([facility] After hours)     Patient family notified on 11/09/16 of transfer.  Name of family member notified:  [place]  PHYSICIAN       Additional Comment:     _______________________________________________ Candie Chroman, LCSW 11/09/2016, 11:14 AM

## 2016-11-09 NOTE — Clinical Social Work Note (Addendum)
CSW has left voicemail for weekend social worker at SNF to see when CSW can call transport.  [name], [place] [phone]  10:05 am Received confirmation from admissions that patient can transport when ready.  [name], [place] [phone]  10:32 am [facility] unable to transport patient today. [place] is closed. Per RNCM earlier this morning, patient had asked if his wife could transport him but was told no because of his PICC line. Discussed with Surveyor, quantity of social work who told CSW to discuss with team and if everyone, including the facility is okay with his wife transporting him, that is fine. If not, will have Mudlogger of social work department call the ambulance company but this could be expensive. CSW has left voicemail for [facility].  [name], CSW [phone]  10:55 am MD is not agreeable to patient's wife transporting him. Discussed with Mudlogger of social work. She has no way of contacting [facility] as they are not open on the weekend to authorization the transportation. CSW called [facility] to see if they could do the transport later today. They will have someone call CSW back.  [name], [place] [phone]  11:19 am [facility] has agreed to transport. Director of social work department notified. RN will call CSW after patient has seen MD so CSW can set up transport. CSW will notify patient and his wife that he will go by ambulance.  [name], [place]

## 2016-11-10 LAB — HCV RNA QUANT: HCV QUANT: UNDETERMINED [IU]/mL (ref >50)

## 2016-11-14 ENCOUNTER — Other Ambulatory Visit: Payer: Self-pay | Admitting: *Deleted

## 2016-11-14 NOTE — Patient Outreach (Addendum)
[facility]) Care Management  11/14/2016  Travis Salinas May 06, 1951 [id number]   Case discussed on 11/14/16 [facility] CM Call.   RNCM advised the group patient discharged to [facility] ([place] facility) on 11/09/16 for IV antibiotics for 2 weeks.  Status post debridement of the left lower leg and arthrotomy on 11/03/16.   [facility] states they have not received authorization request from skilled nursing facility to date and if received will refer to internal UMR CM if appropriate.  No further [facility] Care Management needs at this time and will proceed with case closure.  RNCM will send case closure due to patient currently inpatient at SNF (skilled nursing facility), does not meet program criteria request to [facility] [place] [facility].

## 2016-11-19 ENCOUNTER — Telehealth (INDEPENDENT_AMBULATORY_CARE_PROVIDER_SITE_OTHER): Payer: Self-pay | Admitting: *Deleted

## 2016-11-19 NOTE — Telephone Encounter (Signed)
Called no answer. Could not leave a Voicemail, Mailbox was too full.  If they call back please ask what questions they have. Thanks.  Will try to call pt again later.

## 2016-11-19 NOTE — Telephone Encounter (Signed)
Tell him to put neosporin on the blisters and keep his foot elevated.

## 2016-11-19 NOTE — Telephone Encounter (Signed)
Patient called back and is concerned. He states he has "blisters" in the middle of ankle not on the incision line but just wanted to let you know. They drain some. He changes bandage once a day. He has appt Friday.   Also he states his Great toe is numb- he can wiggle all his toes and can walk.    Please advise. Thank you.

## 2016-11-19 NOTE — Telephone Encounter (Signed)
Patient called in this morning in regards to having a few concerns from his surgery? He has some questions regarding his sutures he had surgery two weeks ago. His Cb# [phone]. Thank you

## 2016-11-20 NOTE — Telephone Encounter (Signed)
Called pt to advise. Pt aware

## 2016-11-21 DIAGNOSIS — F341 Dysthymic disorder: Secondary | ICD-10-CM | POA: Diagnosis not present

## 2016-11-21 DIAGNOSIS — F411 Generalized anxiety disorder: Secondary | ICD-10-CM | POA: Diagnosis not present

## 2016-11-21 DIAGNOSIS — F3341 Major depressive disorder, recurrent, in partial remission: Secondary | ICD-10-CM | POA: Diagnosis not present

## 2016-11-22 ENCOUNTER — Encounter (INDEPENDENT_AMBULATORY_CARE_PROVIDER_SITE_OTHER): Payer: Self-pay | Admitting: Orthopaedic Surgery

## 2016-11-22 ENCOUNTER — Ambulatory Visit (INDEPENDENT_AMBULATORY_CARE_PROVIDER_SITE_OTHER): Payer: 59 | Admitting: Orthopaedic Surgery

## 2016-11-22 DIAGNOSIS — L02419 Cutaneous abscess of limb, unspecified: Secondary | ICD-10-CM

## 2016-11-22 DIAGNOSIS — M659 Synovitis and tenosynovitis, unspecified: Secondary | ICD-10-CM

## 2016-11-22 NOTE — Progress Notes (Signed)
Patient is status post debridement of large lower leg abscess and infected did tenosynovium but his. He returns today for a wound check. He has 2 spots of wound dehiscence with exposed tendon. He is currently on oral Bactrim. Patient does have history of IV drug use and heroin use.  He is finished his 2 week course of IV antibiotics and now he is on oral antibiotics. He denies any constitutional symptoms. The 2 spots of wound dehiscence or proximally 3 mm and 5 mm in diameter. At this point going to refer him to plastic surgery with Dr. Marla Roe for further evaluation of the wound.  He may follow up with me as needed. For now we'll dress the wounds with wet-to-dry dressings. Questions encouraged and answered

## 2016-11-25 ENCOUNTER — Encounter: Payer: Self-pay | Admitting: Internal Medicine

## 2016-11-25 ENCOUNTER — Ambulatory Visit (INDEPENDENT_AMBULATORY_CARE_PROVIDER_SITE_OTHER): Payer: Self-pay | Admitting: Orthopaedic Surgery

## 2016-11-25 ENCOUNTER — Ambulatory Visit (INDEPENDENT_AMBULATORY_CARE_PROVIDER_SITE_OTHER): Payer: 59 | Admitting: Internal Medicine

## 2016-11-25 VITALS — BP 169/81 | HR 99 | Temp 98.3°F | Wt 144.0 lb

## 2016-11-25 DIAGNOSIS — A499 Bacterial infection, unspecified: Secondary | ICD-10-CM | POA: Diagnosis not present

## 2016-11-25 DIAGNOSIS — T887XXA Unspecified adverse effect of drug or medicament, initial encounter: Secondary | ICD-10-CM | POA: Diagnosis not present

## 2016-11-25 DIAGNOSIS — Z5181 Encounter for therapeutic drug level monitoring: Secondary | ICD-10-CM | POA: Insufficient documentation

## 2016-11-25 DIAGNOSIS — L02419 Cutaneous abscess of limb, unspecified: Secondary | ICD-10-CM | POA: Diagnosis not present

## 2016-11-25 DIAGNOSIS — M01X72 Direct infection of left ankle and foot in infectious and parasitic diseases classified elsewhere: Secondary | ICD-10-CM | POA: Diagnosis not present

## 2016-11-25 DIAGNOSIS — F1910 Other psychoactive substance abuse, uncomplicated: Secondary | ICD-10-CM | POA: Diagnosis not present

## 2016-11-25 DIAGNOSIS — T50905A Adverse effect of unspecified drugs, medicaments and biological substances, initial encounter: Secondary | ICD-10-CM

## 2016-11-25 DIAGNOSIS — B182 Chronic viral hepatitis C: Secondary | ICD-10-CM | POA: Diagnosis not present

## 2016-11-25 DIAGNOSIS — M00872 Arthritis due to other bacteria, left ankle and foot: Secondary | ICD-10-CM

## 2016-11-25 LAB — BASIC METABOLIC PANEL
BUN: 22 mg/dL (ref 7–25)
CALCIUM: 9.8 mg/dL (ref 8.6–10.3)
CO2: 21 mmol/L (ref 20–31)
Chloride: 105 mmol/L (ref 98–110)
Creat: 1.22 mg/dL (ref 0.70–1.25)
GLUCOSE: 105 mg/dL — AB (ref 65–99)
Potassium: 4.5 mmol/L (ref 3.5–5.3)
Sodium: 139 mmol/L (ref 135–146)

## 2016-11-25 LAB — CBC WITH DIFFERENTIAL/PLATELET
BASOS ABS: 90 {cells}/uL (ref 0–200)
Basophils Relative: 1 %
EOS ABS: 90 {cells}/uL (ref 15–500)
Eosinophils Relative: 1 %
HEMATOCRIT: 37.4 % — AB (ref 38.5–50.0)
HEMOGLOBIN: 11.6 g/dL — AB (ref 13.2–17.1)
LYMPHS ABS: 1890 {cells}/uL (ref 850–3900)
Lymphocytes Relative: 21 %
MCH: 26.4 pg — AB (ref 27.0–33.0)
MCHC: 31 g/dL — AB (ref 32.0–36.0)
MCV: 85.2 fL (ref 80.0–100.0)
MONO ABS: 900 {cells}/uL (ref 200–950)
MPV: 9 fL (ref 7.5–12.5)
Monocytes Relative: 10 %
NEUTROS PCT: 67 %
Neutro Abs: 6030 cells/uL (ref 1500–7800)
Platelets: 341 10*3/uL (ref 140–400)
RBC: 4.39 MIL/uL (ref 4.20–5.80)
RDW: 15.1 % — ABNORMAL HIGH (ref 11.0–15.0)
WBC: 9 10*3/uL (ref 3.8–10.8)

## 2016-11-25 NOTE — Assessment & Plan Note (Signed)
He will return in July for SVR24 with Pharm D

## 2016-11-25 NOTE — Assessment & Plan Note (Signed)
Encouraged him to remain drug free

## 2016-11-25 NOTE — Assessment & Plan Note (Signed)
Will check a bmp today 

## 2016-11-25 NOTE — Assessment & Plan Note (Signed)
Significant headaches.  Acute since starting pills.  No concerning signs.  As above, will stop rifampin.

## 2016-11-25 NOTE — Assessment & Plan Note (Signed)
Doing well to date.  With his side effects, I most suspect the rifampin and will have him stop that, continue with bactrim for 1 month and stopp.  He will let me know if he is unable to tolerate the Bactrim.   Will check ESR, CRP

## 2016-11-25 NOTE — Progress Notes (Signed)
   Subjective:    Patient ID: Travis Salinas, male    DOB: Aug 05, 1951, 66 y.o.   MRN: [id number]  HPI Here for hsfu.  Admitted earlier this month with septic arthritis and tenosynovitis of ankle and culture with MSSA.  Treated with 2 weeks of IV cefazolin and now on Bactrim and rifampin.  Saw Dr. Erlinda Hong and 2 areas of wound dehiscence and referred to plastic surgery.  Having headaches, shaking.  No rashes.  No associated n/v/d.  No rashes.  Has not been on narcotic pain medications.     Review of Systems  Constitutional: Positive for appetite change. Negative for chills, fatigue and fever.  Skin: Negative for rash.  Neurological: Positive for headaches. Negative for light-headedness and numbness.  Psychiatric/Behavioral: Positive for sleep disturbance.       Due to headaches       Objective:   Physical Exam  Constitutional: He appears well-developed and well-nourished.  Eyes: No scleral icterus.  Cardiovascular: Normal rate, regular rhythm and normal heart sounds.   Psychiatric: He has a normal mood and affect.  anxious   SH: remains drug free now       Assessment & Plan:

## 2016-11-26 LAB — C-REACTIVE PROTEIN: CRP: 4.8 mg/L (ref <8.0)

## 2016-11-26 LAB — SEDIMENTATION RATE: Sed Rate: 8 mm/hr (ref 0–20)

## 2016-11-28 ENCOUNTER — Telehealth (INDEPENDENT_AMBULATORY_CARE_PROVIDER_SITE_OTHER): Payer: Self-pay | Admitting: Radiology

## 2016-11-28 ENCOUNTER — Ambulatory Visit: Payer: 59 | Admitting: Internal Medicine

## 2016-11-28 DIAGNOSIS — I10 Essential (primary) hypertension: Secondary | ICD-10-CM | POA: Diagnosis not present

## 2016-11-28 DIAGNOSIS — Z8619 Personal history of other infectious and parasitic diseases: Secondary | ICD-10-CM | POA: Diagnosis not present

## 2016-11-28 DIAGNOSIS — J449 Chronic obstructive pulmonary disease, unspecified: Secondary | ICD-10-CM | POA: Diagnosis not present

## 2016-11-28 DIAGNOSIS — N400 Enlarged prostate without lower urinary tract symptoms: Secondary | ICD-10-CM | POA: Diagnosis not present

## 2016-11-28 DIAGNOSIS — F1910 Other psychoactive substance abuse, uncomplicated: Secondary | ICD-10-CM | POA: Diagnosis not present

## 2016-11-28 DIAGNOSIS — F39 Unspecified mood [affective] disorder: Secondary | ICD-10-CM | POA: Diagnosis not present

## 2016-11-28 DIAGNOSIS — M65872 Other synovitis and tenosynovitis, left ankle and foot: Secondary | ICD-10-CM | POA: Diagnosis not present

## 2016-11-28 NOTE — Telephone Encounter (Signed)
s/w Crystal at Dr. Marla Roe office and states Dr. Marla Roe has the referral is reviewing it and will call us/pt if approved to schedule appt.  I called pt back s/w wife and advised her the above and once we hear something will be calling to schedule appt

## 2016-11-28 NOTE — Telephone Encounter (Signed)
Patient called concerned that he has not been scheduled for the appt with plastic surgery, he says the tendon is showing in his leg and he needs an appt ASAP.  Can you call them to check status of appt for him?  And please call patient to advise?  Thanks

## 2016-12-02 DIAGNOSIS — S81802A Unspecified open wound, left lower leg, initial encounter: Secondary | ICD-10-CM | POA: Diagnosis not present

## 2016-12-03 ENCOUNTER — Ambulatory Visit (INDEPENDENT_AMBULATORY_CARE_PROVIDER_SITE_OTHER): Payer: 59 | Admitting: Orthopaedic Surgery

## 2016-12-03 ENCOUNTER — Telehealth: Payer: Self-pay | Admitting: *Deleted

## 2016-12-03 ENCOUNTER — Encounter (INDEPENDENT_AMBULATORY_CARE_PROVIDER_SITE_OTHER): Payer: Self-pay | Admitting: Orthopaedic Surgery

## 2016-12-03 DIAGNOSIS — L02419 Cutaneous abscess of limb, unspecified: Secondary | ICD-10-CM

## 2016-12-03 NOTE — Progress Notes (Signed)
Patient follows up today for left ankle redness and wants evaluation. He is scheduled for surgery with Dr. Marla Roe on Thursday. He is currently on Bactrim only. This is managed by Dr. Linus Salmons.  On exam he does have dependent rubor around the distal aspect of the incision and ankle. There is no drainage. There is no cellulitis. There is pitting edema. He has an open wound on the anterior aspect of the leg with exposed extensor tendon. From my standpoint I do not appreciate any signs of deep infection. I appreciate Dr. Eusebio Friendly assistance with his wound. Follow-up with me as needed.

## 2016-12-03 NOTE — Telephone Encounter (Signed)
Patient wife called and left message that the patient has an area on his leg/ankle that is red, swollen and painful. She has called the orthopedic office to see if they will see him today and she will call back to let us know how it goes. Saw in the system where the patient has an appointment scheduled today 12/03/16 with ortho.

## 2016-12-04 ENCOUNTER — Encounter (HOSPITAL_COMMUNITY): Payer: Self-pay | Admitting: *Deleted

## 2016-12-04 ENCOUNTER — Ambulatory Visit: Payer: Self-pay | Admitting: Plastic Surgery

## 2016-12-04 DIAGNOSIS — S81802A Unspecified open wound, left lower leg, initial encounter: Secondary | ICD-10-CM | POA: Diagnosis not present

## 2016-12-04 NOTE — Progress Notes (Signed)
Pt denies SOB and chest pain. Pt under the care of Dr. Marlou Porch, Cardiology. Pt denies having an echo and cardiac cath. Pt stated that a chest x ray was performed by PCP, Dr. Kathyrn Lass of [facility]; records requested.Pt made aware to stop taking vitamins, fish oil and herbal medications. Do not take any NSAIDs ie: Ibuprofen, Advil, Naproxen, BC and Goody Powder. Pt verbalized understanding of all pre-op instructions.

## 2016-12-05 ENCOUNTER — Encounter (HOSPITAL_COMMUNITY): Payer: Self-pay | Admitting: *Deleted

## 2016-12-05 ENCOUNTER — Ambulatory Visit (HOSPITAL_COMMUNITY)
Admission: RE | Admit: 2016-12-05 | Discharge: 2016-12-05 | Disposition: A | Discharge status: Home / Self Care | Payer: 59 | Source: Ambulatory Visit | Attending: Plastic Surgery | Admitting: Plastic Surgery

## 2016-12-05 ENCOUNTER — Encounter (HOSPITAL_COMMUNITY)
Admission: RE | Disposition: A | Discharge status: Home / Self Care | Payer: Self-pay | Source: Ambulatory Visit | Attending: Plastic Surgery

## 2016-12-05 ENCOUNTER — Ambulatory Visit (HOSPITAL_COMMUNITY): Payer: 59 | Admitting: Certified Registered Nurse Anesthetist

## 2016-12-05 DIAGNOSIS — X58XXXD Exposure to other specified factors, subsequent encounter: Secondary | ICD-10-CM | POA: Insufficient documentation

## 2016-12-05 DIAGNOSIS — J449 Chronic obstructive pulmonary disease, unspecified: Secondary | ICD-10-CM | POA: Diagnosis not present

## 2016-12-05 DIAGNOSIS — Z87891 Personal history of nicotine dependence: Secondary | ICD-10-CM | POA: Insufficient documentation

## 2016-12-05 DIAGNOSIS — Z881 Allergy status to other antibiotic agents status: Secondary | ICD-10-CM | POA: Diagnosis not present

## 2016-12-05 DIAGNOSIS — Z7982 Long term (current) use of aspirin: Secondary | ICD-10-CM | POA: Diagnosis not present

## 2016-12-05 DIAGNOSIS — I10 Essential (primary) hypertension: Secondary | ICD-10-CM | POA: Diagnosis not present

## 2016-12-05 DIAGNOSIS — S81802A Unspecified open wound, left lower leg, initial encounter: Secondary | ICD-10-CM | POA: Diagnosis not present

## 2016-12-05 DIAGNOSIS — B1920 Unspecified viral hepatitis C without hepatic coma: Secondary | ICD-10-CM | POA: Diagnosis not present

## 2016-12-05 DIAGNOSIS — D72829 Elevated white blood cell count, unspecified: Secondary | ICD-10-CM | POA: Diagnosis not present

## 2016-12-05 DIAGNOSIS — S81802D Unspecified open wound, left lower leg, subsequent encounter: Secondary | ICD-10-CM | POA: Insufficient documentation

## 2016-12-05 DIAGNOSIS — M542 Cervicalgia: Secondary | ICD-10-CM | POA: Diagnosis not present

## 2016-12-05 HISTORY — DX: Headache: R51

## 2016-12-05 HISTORY — DX: Headache, unspecified: R51.9

## 2016-12-05 HISTORY — PX: APPLICATION OF A-CELL OF EXTREMITY: SHX6303

## 2016-12-05 HISTORY — PX: I&D EXTREMITY: SHX5045

## 2016-12-05 HISTORY — PX: APPLICATION OF WOUND VAC: SHX5189

## 2016-12-05 SURGERY — IRRIGATION AND DEBRIDEMENT EXTREMITY
Anesthesia: General | Site: Leg Lower | Laterality: Left

## 2016-12-05 MED ORDER — ACETAMINOPHEN 325 MG PO TABS
ORAL_TABLET | ORAL | Status: AC
  Filled 2016-12-05: qty 1

## 2016-12-05 MED ORDER — ACETAMINOPHEN 650 MG RE SUPP: 650.0000 mg | RECTAL | Status: DC | PRN

## 2016-12-05 MED ORDER — OXYCODONE HCL 5 MG PO TABS
5.0000 mg | ORAL_TABLET | Freq: Once | ORAL | Status: AC | PRN
  Administered 2016-12-05: 5 mg via ORAL

## 2016-12-05 MED ORDER — OXYCODONE HCL 5 MG PO TABS
ORAL_TABLET | ORAL | Status: AC
  Administered 2016-12-05: 5 mg via ORAL
  Filled 2016-12-05: qty 1

## 2016-12-05 MED ORDER — SODIUM CHLORIDE 0.9% FLUSH: 3.0000 mL | Freq: Two times a day (BID) | INTRAVENOUS | Status: DC

## 2016-12-05 MED ORDER — LACTATED RINGERS IV SOLN
INTRAVENOUS | Status: DC
  Administered 2016-12-05: 08:00:00 via INTRAVENOUS

## 2016-12-05 MED ORDER — ACETAMINOPHEN 325 MG PO TABS: 650.0000 mg | ORAL_TABLET | ORAL | Status: DC | PRN

## 2016-12-05 MED ORDER — EPHEDRINE SULFATE 50 MG/ML IJ SOLN
INTRAMUSCULAR | Status: DC | PRN
  Administered 2016-12-05 (×3): 5 mg via INTRAVENOUS

## 2016-12-05 MED ORDER — FENTANYL CITRATE (PF) 100 MCG/2ML IJ SOLN
INTRAMUSCULAR | Status: AC
  Administered 2016-12-05: 50 ug via INTRAVENOUS
  Filled 2016-12-05: qty 2

## 2016-12-05 MED ORDER — SUGAMMADEX SODIUM 200 MG/2ML IV SOLN
INTRAVENOUS | Status: AC
  Filled 2016-12-05: qty 2

## 2016-12-05 MED ORDER — PROPOFOL 10 MG/ML IV BOLUS
INTRAVENOUS | Status: AC
  Filled 2016-12-05: qty 20

## 2016-12-05 MED ORDER — PHENYLEPHRINE HCL 10 MG/ML IJ SOLN
INTRAMUSCULAR | Status: DC | PRN
  Administered 2016-12-05: 80 ug via INTRAVENOUS
  Administered 2016-12-05: 120 ug via INTRAVENOUS
  Administered 2016-12-05: 40 ug via INTRAVENOUS
  Administered 2016-12-05: 80 ug via INTRAVENOUS
  Administered 2016-12-05: 120 ug via INTRAVENOUS
  Administered 2016-12-05: 80 ug via INTRAVENOUS

## 2016-12-05 MED ORDER — SODIUM CHLORIDE 0.9 % IR SOLN
Status: DC | PRN
  Administered 2016-12-05: 1000 mL

## 2016-12-05 MED ORDER — OXYCODONE HCL 5 MG/5ML PO SOLN: 5.0000 mg | Freq: Once | ORAL | Status: AC | PRN

## 2016-12-05 MED ORDER — FENTANYL CITRATE (PF) 100 MCG/2ML IJ SOLN
25.0000 ug | INTRAMUSCULAR | Status: DC | PRN
  Administered 2016-12-05: 50 ug via INTRAVENOUS

## 2016-12-05 MED ORDER — SODIUM CHLORIDE 0.9 % IR SOLN
Status: DC | PRN
  Administered 2016-12-05: 500 mL

## 2016-12-05 MED ORDER — SODIUM CHLORIDE 0.9 % IV SOLN: 250.0000 mL | INTRAVENOUS | Status: DC | PRN

## 2016-12-05 MED ORDER — ACETAMINOPHEN 325 MG PO TABS
ORAL_TABLET | ORAL | Status: AC
  Administered 2016-12-05: 650 mg via ORAL
  Filled 2016-12-05: qty 2

## 2016-12-05 MED ORDER — SODIUM CHLORIDE 0.9% FLUSH: 3.0000 mL | INTRAVENOUS | Status: DC | PRN

## 2016-12-05 MED ORDER — FENTANYL CITRATE (PF) 250 MCG/5ML IJ SOLN
INTRAMUSCULAR | Status: AC
  Filled 2016-12-05: qty 5

## 2016-12-05 MED ORDER — LIDOCAINE HCL (CARDIAC) 20 MG/ML IV SOLN
INTRAVENOUS | Status: DC | PRN
  Administered 2016-12-05: 60 mg via INTRAVENOUS

## 2016-12-05 MED ORDER — MIDAZOLAM HCL 2 MG/2ML IJ SOLN
INTRAMUSCULAR | Status: DC | PRN
  Administered 2016-12-05: 2 mg via INTRAVENOUS

## 2016-12-05 MED ORDER — CEFAZOLIN SODIUM-DEXTROSE 2-4 GM/100ML-% IV SOLN
2.0000 g | INTRAVENOUS | Status: AC
  Administered 2016-12-05: 2 g via INTRAVENOUS
  Filled 2016-12-05: qty 100

## 2016-12-05 MED ORDER — MIDAZOLAM HCL 2 MG/2ML IJ SOLN
INTRAMUSCULAR | Status: AC
Start: 2016-12-05 — End: 2016-12-05
  Filled 2016-12-05: qty 2

## 2016-12-05 MED ORDER — PROPOFOL 10 MG/ML IV BOLUS
INTRAVENOUS | Status: DC | PRN
  Administered 2016-12-05: 160 mg via INTRAVENOUS
  Administered 2016-12-05: 40 mg via INTRAVENOUS

## 2016-12-05 MED ORDER — ONDANSETRON HCL 4 MG/2ML IJ SOLN
INTRAMUSCULAR | Status: DC | PRN
  Administered 2016-12-05: 4 mg via INTRAVENOUS

## 2016-12-05 MED ORDER — ACETAMINOPHEN 325 MG PO TABS
650.0000 mg | ORAL_TABLET | ORAL | Status: DC | PRN
  Administered 2016-12-05: 650 mg via ORAL

## 2016-12-05 MED ORDER — PHENYLEPHRINE HCL 10 MG/ML IJ SOLN
INTRAVENOUS | Status: DC | PRN
  Administered 2016-12-05: 50 ug/min via INTRAVENOUS

## 2016-12-05 MED ORDER — FENTANYL CITRATE (PF) 100 MCG/2ML IJ SOLN
INTRAMUSCULAR | Status: DC | PRN
  Administered 2016-12-05: 25 ug via INTRAVENOUS

## 2016-12-05 SURGICAL SUPPLY — 56 items
BANDAGE ACE 4X5 VEL STRL LF (GAUZE/BANDAGES/DRESSINGS) IMPLANT
BANDAGE ELASTIC 4 VELCRO ST LF (GAUZE/BANDAGES/DRESSINGS) ×6 IMPLANT
BLADE CLIPPER SURG (BLADE) IMPLANT
BNDG GAUZE ELAST 4 BULKY (GAUZE/BANDAGES/DRESSINGS) ×6 IMPLANT
CANISTER SUCT 3000ML PPV (MISCELLANEOUS) ×3 IMPLANT
CANISTER WOUND CARE 500ML ATS (WOUND CARE) ×3 IMPLANT
CHLORAPREP W/TINT 26ML (MISCELLANEOUS) IMPLANT
CONT SPEC 4OZ CLIKSEAL STRL BL (MISCELLANEOUS) ×6 IMPLANT
COVER SURGICAL LIGHT HANDLE (MISCELLANEOUS) ×3 IMPLANT
DRAPE HALF SHEET 40X57 (DRAPES) IMPLANT
DRAPE INCISE IOBAN 66X45 STRL (DRAPES) ×3 IMPLANT
DRAPE ORTHO SPLIT 77X108 STRL (DRAPES)
DRAPE SURG ORHT 6 SPLT 77X108 (DRAPES) IMPLANT
DRESSING HYDROCOLLOID 4X4 (GAUZE/BANDAGES/DRESSINGS) ×3 IMPLANT
DRESSING HYDROCOLLOID 4X4 XTH (GAUZE/BANDAGES/DRESSINGS) ×3 IMPLANT
DRSG ADAPTIC 3X8 NADH LF (GAUZE/BANDAGES/DRESSINGS) IMPLANT
DRSG CUTIMED SORBACT 7X9 (GAUZE/BANDAGES/DRESSINGS) ×3 IMPLANT
DRSG PAD ABDOMINAL 8X10 ST (GAUZE/BANDAGES/DRESSINGS) IMPLANT
DRSG VAC ATS LRG SENSATRAC (GAUZE/BANDAGES/DRESSINGS) IMPLANT
DRSG VAC ATS MED SENSATRAC (GAUZE/BANDAGES/DRESSINGS) ×3 IMPLANT
DRSG VAC ATS SM SENSATRAC (GAUZE/BANDAGES/DRESSINGS) IMPLANT
ELECT CAUTERY BLADE 6.4 (BLADE) ×3 IMPLANT
ELECT REM PT RETURN 9FT ADLT (ELECTROSURGICAL) ×3
ELECTRODE REM PT RTRN 9FT ADLT (ELECTROSURGICAL) ×1 IMPLANT
GAUZE SPONGE 4X4 12PLY STRL (GAUZE/BANDAGES/DRESSINGS) IMPLANT
GEL ULTRASOUND 20GR AQUASONIC (MISCELLANEOUS) IMPLANT
GLOVE BIO SURGEON STRL SZ 6.5 (GLOVE) ×8 IMPLANT
GLOVE BIO SURGEONS STRL SZ 6.5 (GLOVE) ×4
GOWN STRL REUS W/ TWL LRG LVL3 (GOWN DISPOSABLE) ×3 IMPLANT
GOWN STRL REUS W/TWL LRG LVL3 (GOWN DISPOSABLE) ×6
HANDPIECE INTERPULSE COAX TIP (DISPOSABLE)
KIT BASIN OR (CUSTOM PROCEDURE TRAY) ×3 IMPLANT
KIT ROOM TURNOVER OR (KITS) ×3 IMPLANT
MATRIX SURGICAL PSM 5X5CM (Tissue) ×3 IMPLANT
MICROMATRIX 1000MG (Tissue) ×3 IMPLANT
NS IRRIG 1000ML POUR BTL (IV SOLUTION) ×3 IMPLANT
PACK GENERAL/GYN (CUSTOM PROCEDURE TRAY) ×3 IMPLANT
PACK ORTHO EXTREMITY (CUSTOM PROCEDURE TRAY) ×3 IMPLANT
PAD ARMBOARD 7.5X6 YLW CONV (MISCELLANEOUS) ×6 IMPLANT
PAD NEG PRESSURE SENSATRAC (MISCELLANEOUS) IMPLANT
SET HNDPC FAN SPRY TIP SCT (DISPOSABLE) IMPLANT
SOLUTION PARTIC MCRMTRX 1000MG (Tissue) ×1 IMPLANT
STAPLER VISISTAT 35W (STAPLE) IMPLANT
STOCKINETTE IMPERVIOUS 9X36 MD (GAUZE/BANDAGES/DRESSINGS) IMPLANT
STOCKINETTE IMPERVIOUS LG (DRAPES) IMPLANT
SUT MNCRL AB 4-0 PS2 18 (SUTURE) ×3 IMPLANT
SUT MON AB 5-0 PS2 18 (SUTURE) ×3 IMPLANT
SUT SILK 4 0 P 3 (SUTURE) IMPLANT
SUT SILK 4 0 PS 2 (SUTURE) IMPLANT
SUT VIC AB 5-0 PS2 18 (SUTURE) IMPLANT
TOWEL OR 17X24 6PK STRL BLUE (TOWEL DISPOSABLE) ×3 IMPLANT
TOWEL OR 17X26 10 PK STRL BLUE (TOWEL DISPOSABLE) ×3 IMPLANT
TUBE CONNECTING 12'X1/4 (SUCTIONS) ×1
TUBE CONNECTING 12X1/4 (SUCTIONS) ×2 IMPLANT
UNDERPAD 30X30 (UNDERPADS AND DIAPERS) IMPLANT
YANKAUER SUCT BULB TIP NO VENT (SUCTIONS) ×3 IMPLANT

## 2016-12-05 NOTE — Anesthesia Preprocedure Evaluation (Addendum)
Anesthesia Evaluation  Patient identified by MRN, date of birth, ID band Patient awake    Reviewed: Allergy & Precautions, NPO status , Patient's Chart, lab work & pertinent test results  History of Anesthesia Complications Negative for: history of anesthetic complications  Airway Mallampati: II  TM Distance: >3 FB Neck ROM: Full    Dental  (+) Poor Dentition, Missing, Dental Advisory Given   Pulmonary COPD, former smoker,    breath sounds clear to auscultation       Cardiovascular hypertension, Pt. on medications and Pt. on home beta blockers  Rhythm:Regular     Neuro/Psych  Headaches, PSYCHIATRIC DISORDERS Depression    GI/Hepatic (+)     substance abuse (last used 6 weeks ago)  IV drug use, Hepatitis -, C  Endo/Other    Renal/GU Renal InsufficiencyRenal disease     Musculoskeletal   Abdominal   Peds  Hematology   Anesthesia Other Findings   Reproductive/Obstetrics                            Anesthesia Physical Anesthesia Plan  ASA: II  Anesthesia Plan: General   Post-op Pain Management:    Induction: Intravenous  Airway Management Planned: LMA  Additional Equipment: None  Intra-op Plan:   Post-operative Plan: Extubation in OR  Informed Consent: I have reviewed the patients History and Physical, chart, labs and discussed the procedure including the risks, benefits and alternatives for the proposed anesthesia with the patient or authorized representative who has indicated his/her understanding and acceptance.   Dental advisory given  Plan Discussed with: CRNA and Anesthesiologist  Anesthesia Plan Comments:        Anesthesia Quick Evaluation

## 2016-12-05 NOTE — Transfer of Care (Signed)
Immediate Anesthesia Transfer of Care Note  Patient: Travis Salinas  Procedure(s) Performed: Procedure(s): IRRIGATION AND DEBRIDEMENT EXTREMITY (Left) APPLICATION OF A-CELL OF EXTREMITY (Left) APPLICATION OF WOUND VAC (Left)  Patient Location: PACU  Anesthesia Type:General  Level of Consciousness: awake, alert  and oriented  Airway & Oxygen Therapy: Patient Spontanous Breathing  Post-op Assessment: Report given to RN and Post -op Vital signs reviewed and stable  Post vital signs: Reviewed and stable  Last Vitals:  Vitals:   12/05/16 0749  BP: (!) 121/55  Pulse: 70  Resp: 20  Temp: 36.4 C    Last Pain:  Vitals:   12/05/16 0749  TempSrc: Oral      Patients Stated Pain Goal: 7 (94/85/[id number])  Complications: No apparent anesthesia complications

## 2016-12-05 NOTE — Op Note (Signed)
DATE OF OPERATION: 12/05/2016  LOCATION: [facility]  PREOPERATIVE DIAGNOSIS: Chronic Left leg wound  POSTOPERATIVE DIAGNOSIS: Same  PROCEDURE:  1. Excisional debridement of left leg wound 2 x 7 cm skin and tendon 2. Layered closure of 5 cm  3. Acell placement (5 x 5 cm and 500 mg powder) and VAC  SURGEON: Joslynn Jamroz [place]anger Shae Augello, DO  ASSISTANT: Shawn Rayburn, PA  EBL: 20 cc  CONDITION: Stable  COMPLICATIONS: None  INDICATION: The patient, Travis Salinas, is a 66 y.o. male born on [birth year], is here for treatment of a chronic left leg wound after drainage of an abscess. He now has loss of skin over his tendon and a wound.  PROCEDURE DETAILS:  The patient was seen prior to surgery and marked.  The IV antibiotics were given. The patient was taken to the operating room and given a general anesthetic. A standard time out was performed and all information was confirmed by those in the room. SCD was placed on the right leg.   The nonviable skin was excised with a #15 blade and scissors that included 2 x 7 cm.  There was 2 cm of a medial portion of the tendon and peritenon that was nonviable and this was excised with the scissors.  The area was irrigated with antibiotic solution and saline.  Hemostasis was achieved with electrocautery.  The lateral and medial flap were undermined 3 cm to advance the tissue over the tendon. All of the Acell powder and sheet were placed on the area and secured with the 5-0 Vicryl under the skin flaps.  The distal 2.5 and proximal 2.5 cm of the wound were closed in layers with the 4-0 and 5-0 Monocryl over the tendon.  The central portion was left open to allow for drainage.  The sorbact was applied and secured with the 5-0 Vicryl. The KY gel and the VAC were applied and secured with the [place].  There was an excellent seal.  The leg was wrapped with kerlex and ace wrap. The patient was allowed to wake up and taken to recovery room in stable  condition at the end of the case. The family was notified at the end of the case.

## 2016-12-05 NOTE — Discharge Instructions (Signed)
Keep VAC connected. Do not get wet. Do not take a shower. Continue vitamins and protein intake. VAC to be changed in one week.

## 2016-12-05 NOTE — Anesthesia Procedure Notes (Signed)
Procedure Name: LMA Insertion Date/Time: 12/05/2016 9:53 AM Performed by: Merdis Delay Pre-anesthesia Checklist: Patient identified, Emergency Drugs available, Suction available, Patient being monitored and Timeout performed Patient Re-evaluated:Patient Re-evaluated prior to inductionOxygen Delivery Method: Circle system utilized Preoxygenation: Pre-oxygenation with 100% oxygen Intubation Type: IV induction Ventilation: Mask ventilation without difficulty LMA: LMA inserted LMA Size: 4.0 Number of attempts: 1 Placement Confirmation: positive ETCO2 and breath sounds checked- equal and bilateral Tube secured with: Tape Dental Injury: Teeth and Oropharynx as per pre-operative assessment

## 2016-12-05 NOTE — H&P (Signed)
Travis Salinas Travis Salinas is an 66 y.o. male.   Chief Complaint: left leg wound HPI: The patient is a 66 yrs old wm here for treatment of his left leg wound.  He had an abscess that was drained and he was treated with antibiotics.  He now has exposed tendon (~1 x 6 cm) on the left lower leg.  He has a history of IV drug abuse.  Past Medical History:  Diagnosis Date  . Arthritis   . Cervical disc herniation   . COPD (chronic obstructive pulmonary disease) ([place])   . Headache    " due to antibiotics"  . Hepatitis C   . Hypertension     Past Surgical History:  Procedure Laterality Date  . I&D EXTREMITY Left 11/03/2016   Procedure: IRRIGATION AND DEBRIDEMENT EXTREMITY;  Surgeon: Leandrew Koyanagi, MD;  Location: [facility];  Service: Orthopedics;  Laterality: Left;  . INCISION AND DRAINAGE ABSCESS Right 11/12/2013   Procedure: INCISION AND DRAINAGE AND OPEN PACKING OF RIGHT CALF  ABSCESS;  Surgeon: Earnstine Regal, MD;  Location: [facility];  Service: General;  Laterality: Right;  . KNEE SURGERY      Family History  Problem Relation Age of Onset  . Heart disease Mother   . Stroke Mother   . Heart disease Father   . Stroke Father    Social History:  reports that he has quit smoking. His smoking use included Cigarettes. He has a 12.50 pack-year smoking history. He has never used smokeless tobacco. He reports that he uses drugs, including Heroin. He reports that he does not drink alcohol.  Allergies:  Allergies  Allergen Reactions  . Propoxyphene Nausea And Vomiting  . Ciprofloxacin Nausea Only  . Depakote [Divalproex Sodium] Other (See Comments)    hallucinations    Medications Prior to Admission  Medication Sig Dispense Refill  . amLODipine (NORVASC) 10 MG tablet Take 10 mg by mouth every morning.    . ARIPiprazole (ABILIFY) 15 MG tablet Take 15 mg by mouth daily.  5  . aspirin EC 81 MG tablet Take 1 tablet (81 mg total) by mouth daily. 90 tablet 3  . atenolol (TENORMIN) 50 MG tablet Take 25 mg by  mouth daily.  0  . clonazePAM (KLONOPIN) 1 MG tablet Take 1 tablet (1 mg total) by mouth 2 (two) times daily as needed for anxiety. 4 tablet 0  . FLUoxetine (PROZAC) 20 MG capsule Take 1 capsule (20 mg total) by mouth daily. 30 capsule 0  . lamoTRIgine (LAMICTAL) 200 MG tablet Take 200 mg by mouth every morning.    Marland Kitchen RAPAFLO 8 MG CAPS capsule Take 8 mg by mouth daily.  3  . sulfamethoxazole-trimethoprim (BACTRIM DS,SEPTRA DS) 800-160 MG tablet Take 1 tablet by mouth 2 (two) times daily. 60 tablet 0    No results found for this or any previous visit (from the past 48 hour(s)). No results found.  Review of Systems  Constitutional: Negative.   HENT: Negative.   Eyes: Negative.   Respiratory: Negative.   Cardiovascular: Negative.   Gastrointestinal: Negative.   Genitourinary: Negative.   Musculoskeletal: Negative.   Skin: Negative.   Psychiatric/Behavioral: Negative.     Blood pressure (!) 121/55, pulse 70, temperature 97.6 F (36.4 C), temperature source Oral, resp. rate 20, height 5\' 10"  (1.778 m), weight 65.3 kg (144 lb), SpO2 100 %. Physical Exam  Constitutional: He appears well-developed.  HENT:  Head: Normocephalic and atraumatic.  Eyes: EOM are normal. Pupils are  equal, round, and reactive to light.  Cardiovascular: Normal rate.   Respiratory: Effort normal. No respiratory distress.  GI: Soft. He exhibits no distension.  Musculoskeletal: He exhibits edema and tenderness.  Neurological: He is alert.  Skin: Skin is warm.  Psychiatric: He has a normal mood and affect. His behavior is normal. Judgment and thought content normal.     Assessment/Plan Plan for debridement of left leg wound with Acell and VAC placement.  Wallace Going, DO 12/05/2016, 9:11 AM

## 2016-12-06 ENCOUNTER — Encounter (HOSPITAL_COMMUNITY): Payer: Self-pay | Admitting: Plastic Surgery

## 2016-12-06 NOTE — Anesthesia Postprocedure Evaluation (Addendum)
Anesthesia Post Note  Patient: Travis Salinas  Procedure(s) Performed: Procedure(s) (LRB): IRRIGATION AND DEBRIDEMENT EXTREMITY (Left) APPLICATION OF A-CELL OF EXTREMITY (Left) APPLICATION OF WOUND VAC (Left)  Patient location during evaluation: PACU Anesthesia Type: General Level of consciousness: awake and alert Pain management: pain level controlled Vital Signs Assessment: post-procedure vital signs reviewed and stable Respiratory status: spontaneous breathing, nonlabored ventilation, respiratory function stable and patient connected to nasal cannula oxygen Cardiovascular status: blood pressure returned to baseline and stable Postop Assessment: no signs of nausea or vomiting Anesthetic complications: no       Last Vitals:  Vitals:   12/05/16 1125 12/05/16 1130  BP:  135/74  Pulse: 66 69  Resp: 15 13  Temp: 36.4 C 36.4 C    Last Pain:  Vitals:   12/05/16 1100  TempSrc:   PainSc: 8                  Selyna Klahn

## 2016-12-13 DIAGNOSIS — S81802A Unspecified open wound, left lower leg, initial encounter: Secondary | ICD-10-CM | POA: Diagnosis not present

## 2016-12-20 DIAGNOSIS — S81802D Unspecified open wound, left lower leg, subsequent encounter: Secondary | ICD-10-CM | POA: Diagnosis not present

## 2016-12-30 DIAGNOSIS — L080 Pyoderma: Secondary | ICD-10-CM | POA: Diagnosis not present

## 2016-12-30 DIAGNOSIS — H9193 Unspecified hearing loss, bilateral: Secondary | ICD-10-CM | POA: Diagnosis not present

## 2016-12-30 DIAGNOSIS — M65872 Other synovitis and tenosynovitis, left ankle and foot: Secondary | ICD-10-CM | POA: Diagnosis not present

## 2016-12-30 DIAGNOSIS — S81802D Unspecified open wound, left lower leg, subsequent encounter: Secondary | ICD-10-CM | POA: Diagnosis not present

## 2016-12-30 DIAGNOSIS — F1910 Other psychoactive substance abuse, uncomplicated: Secondary | ICD-10-CM | POA: Diagnosis not present

## 2016-12-31 ENCOUNTER — Other Ambulatory Visit: Payer: Self-pay | Admitting: Family Medicine

## 2016-12-31 DIAGNOSIS — L080 Pyoderma: Secondary | ICD-10-CM | POA: Diagnosis not present

## 2017-01-03 DIAGNOSIS — S81802A Unspecified open wound, left lower leg, initial encounter: Secondary | ICD-10-CM | POA: Diagnosis not present

## 2017-01-06 DIAGNOSIS — S81802D Unspecified open wound, left lower leg, subsequent encounter: Secondary | ICD-10-CM | POA: Diagnosis not present

## 2017-01-07 DIAGNOSIS — S81802A Unspecified open wound, left lower leg, initial encounter: Secondary | ICD-10-CM | POA: Diagnosis not present

## 2017-01-13 DIAGNOSIS — S81802D Unspecified open wound, left lower leg, subsequent encounter: Secondary | ICD-10-CM | POA: Diagnosis not present

## 2017-01-15 DIAGNOSIS — M47812 Spondylosis without myelopathy or radiculopathy, cervical region: Secondary | ICD-10-CM | POA: Diagnosis not present

## 2017-01-15 DIAGNOSIS — M545 Low back pain: Secondary | ICD-10-CM | POA: Diagnosis not present

## 2017-01-15 DIAGNOSIS — M1711 Unilateral primary osteoarthritis, right knee: Secondary | ICD-10-CM | POA: Diagnosis not present

## 2017-01-21 DIAGNOSIS — Z125 Encounter for screening for malignant neoplasm of prostate: Secondary | ICD-10-CM | POA: Diagnosis not present

## 2017-01-21 DIAGNOSIS — N400 Enlarged prostate without lower urinary tract symptoms: Secondary | ICD-10-CM | POA: Diagnosis not present

## 2017-01-24 DIAGNOSIS — S81802D Unspecified open wound, left lower leg, subsequent encounter: Secondary | ICD-10-CM | POA: Diagnosis not present

## 2017-01-25 ENCOUNTER — Encounter (HOSPITAL_BASED_OUTPATIENT_CLINIC_OR_DEPARTMENT_OTHER): Payer: Self-pay | Admitting: Emergency Medicine

## 2017-01-25 ENCOUNTER — Emergency Department (HOSPITAL_BASED_OUTPATIENT_CLINIC_OR_DEPARTMENT_OTHER): Payer: 59

## 2017-01-25 ENCOUNTER — Inpatient Hospital Stay (HOSPITAL_BASED_OUTPATIENT_CLINIC_OR_DEPARTMENT_OTHER)
Admission: EM | Admit: 2017-01-25 | Discharge: 2017-01-31 | DRG: 571 | Disposition: A | Discharge status: Home / Self Care | Payer: 59 | Attending: Internal Medicine | Admitting: Internal Medicine

## 2017-01-25 DIAGNOSIS — Z7982 Long term (current) use of aspirin: Secondary | ICD-10-CM | POA: Diagnosis not present

## 2017-01-25 DIAGNOSIS — L02416 Cutaneous abscess of left lower limb: Secondary | ICD-10-CM | POA: Diagnosis present

## 2017-01-25 DIAGNOSIS — N183 Chronic kidney disease, stage 3 (moderate): Secondary | ICD-10-CM | POA: Diagnosis present

## 2017-01-25 DIAGNOSIS — M65869 Other synovitis and tenosynovitis, unspecified lower leg: Secondary | ICD-10-CM | POA: Diagnosis not present

## 2017-01-25 DIAGNOSIS — Z888 Allergy status to other drugs, medicaments and biological substances status: Secondary | ICD-10-CM | POA: Diagnosis not present

## 2017-01-25 DIAGNOSIS — N179 Acute kidney failure, unspecified: Secondary | ICD-10-CM | POA: Diagnosis present

## 2017-01-25 DIAGNOSIS — F1410 Cocaine abuse, uncomplicated: Secondary | ICD-10-CM | POA: Diagnosis present

## 2017-01-25 DIAGNOSIS — F419 Anxiety disorder, unspecified: Secondary | ICD-10-CM | POA: Diagnosis present

## 2017-01-25 DIAGNOSIS — N400 Enlarged prostate without lower urinary tract symptoms: Secondary | ICD-10-CM | POA: Diagnosis present

## 2017-01-25 DIAGNOSIS — F329 Major depressive disorder, single episode, unspecified: Secondary | ICD-10-CM | POA: Diagnosis not present

## 2017-01-25 DIAGNOSIS — J440 Chronic obstructive pulmonary disease with acute lower respiratory infection: Secondary | ICD-10-CM | POA: Diagnosis present

## 2017-01-25 DIAGNOSIS — D72829 Elevated white blood cell count, unspecified: Secondary | ICD-10-CM | POA: Diagnosis not present

## 2017-01-25 DIAGNOSIS — L0291 Cutaneous abscess, unspecified: Secondary | ICD-10-CM | POA: Diagnosis not present

## 2017-01-25 DIAGNOSIS — I129 Hypertensive chronic kidney disease with stage 1 through stage 4 chronic kidney disease, or unspecified chronic kidney disease: Secondary | ICD-10-CM | POA: Diagnosis present

## 2017-01-25 DIAGNOSIS — F14929 Cocaine use, unspecified with intoxication, unspecified: Secondary | ICD-10-CM | POA: Diagnosis not present

## 2017-01-25 DIAGNOSIS — K740 Hepatic fibrosis: Secondary | ICD-10-CM | POA: Diagnosis not present

## 2017-01-25 DIAGNOSIS — F1990 Other psychoactive substance use, unspecified, uncomplicated: Secondary | ICD-10-CM | POA: Diagnosis present

## 2017-01-25 DIAGNOSIS — B1920 Unspecified viral hepatitis C without hepatic coma: Secondary | ICD-10-CM | POA: Diagnosis present

## 2017-01-25 DIAGNOSIS — Z87891 Personal history of nicotine dependence: Secondary | ICD-10-CM | POA: Diagnosis not present

## 2017-01-25 DIAGNOSIS — F418 Other specified anxiety disorders: Secondary | ICD-10-CM | POA: Diagnosis present

## 2017-01-25 DIAGNOSIS — F1190 Opioid use, unspecified, uncomplicated: Secondary | ICD-10-CM | POA: Diagnosis not present

## 2017-01-25 DIAGNOSIS — L03116 Cellulitis of left lower limb: Principal | ICD-10-CM | POA: Diagnosis present

## 2017-01-25 DIAGNOSIS — L0390 Cellulitis, unspecified: Secondary | ICD-10-CM | POA: Diagnosis present

## 2017-01-25 DIAGNOSIS — S81802A Unspecified open wound, left lower leg, initial encounter: Secondary | ICD-10-CM | POA: Diagnosis not present

## 2017-01-25 DIAGNOSIS — I10 Essential (primary) hypertension: Secondary | ICD-10-CM | POA: Diagnosis not present

## 2017-01-25 DIAGNOSIS — L02419 Cutaneous abscess of limb, unspecified: Secondary | ICD-10-CM

## 2017-01-25 DIAGNOSIS — T8189XA Other complications of procedures, not elsewhere classified, initial encounter: Secondary | ICD-10-CM | POA: Diagnosis not present

## 2017-01-25 DIAGNOSIS — N189 Chronic kidney disease, unspecified: Secondary | ICD-10-CM | POA: Diagnosis not present

## 2017-01-25 LAB — CBC WITH DIFFERENTIAL/PLATELET
Basophils Absolute: 0 10*3/uL (ref 0.0–0.1)
Basophils Relative: 0 %
EOS PCT: 3 %
Eosinophils Absolute: 0.4 10*3/uL (ref 0.0–0.7)
HCT: 36.6 % — ABNORMAL LOW (ref 39.0–52.0)
Hemoglobin: 11.5 g/dL — ABNORMAL LOW (ref 13.0–17.0)
Lymphocytes Relative: 27 %
Lymphs Abs: 3.7 10*3/uL (ref 0.7–4.0)
MCH: 28.1 pg (ref 26.0–34.0)
MCHC: 31.4 g/dL (ref 30.0–36.0)
MCV: 89.5 fL (ref 78.0–100.0)
MONOS PCT: 12 %
Monocytes Absolute: 1.6 10*3/uL — ABNORMAL HIGH (ref 0.1–1.0)
Neutro Abs: 8 10*3/uL — ABNORMAL HIGH (ref 1.7–7.7)
Neutrophils Relative %: 58 %
PLATELETS: ADEQUATE 10*3/uL (ref 150–400)
RBC: 4.09 MIL/uL — AB (ref 4.22–5.81)
RDW: 14.8 % (ref 11.5–15.5)
WBC: 13.7 10*3/uL — ABNORMAL HIGH (ref 4.0–10.5)

## 2017-01-25 LAB — BASIC METABOLIC PANEL
ANION GAP: 7 (ref 5–15)
BUN: 17 mg/dL (ref 6–20)
CO2: 25 mmol/L (ref 22–32)
Calcium: 9.2 mg/dL (ref 8.9–10.3)
Chloride: 107 mmol/L (ref 101–111)
Creatinine, Ser: 1.34 mg/dL — ABNORMAL HIGH (ref 0.61–1.24)
GFR, EST NON AFRICAN AMERICAN: 54 mL/min — AB (ref >60)
GLUCOSE: 89 mg/dL (ref 65–99)
POTASSIUM: 4.9 mmol/L (ref 3.5–5.1)
Sodium: 139 mmol/L (ref 135–145)

## 2017-01-25 MED ORDER — ARIPIPRAZOLE 10 MG PO TABS
15.0000 mg | ORAL_TABLET | Freq: Every day | ORAL | Status: DC
  Administered 2017-01-26 – 2017-01-31 (×6): 15 mg via ORAL
  Filled 2017-01-25: qty 2
  Filled 2017-01-25: qty 1
  Filled 2017-01-25 (×4): qty 2

## 2017-01-25 MED ORDER — ASPIRIN EC 81 MG PO TBEC
81.0000 mg | DELAYED_RELEASE_TABLET | Freq: Every day | ORAL | Status: DC
  Administered 2017-01-26 – 2017-01-31 (×6): 81 mg via ORAL
  Filled 2017-01-25 (×6): qty 1

## 2017-01-25 MED ORDER — SODIUM CHLORIDE 0.9 % IV SOLN
INTRAVENOUS | Status: DC
Start: 2017-01-25 — End: 2017-01-27
  Administered 2017-01-26 – 2017-01-27 (×2): via INTRAVENOUS

## 2017-01-25 MED ORDER — VANCOMYCIN HCL IN DEXTROSE 1-5 GM/200ML-% IV SOLN
1000.0000 mg | Freq: Once | INTRAVENOUS | Status: AC
  Administered 2017-01-25: 1000 mg via INTRAVENOUS
  Filled 2017-01-25: qty 200

## 2017-01-25 MED ORDER — ENOXAPARIN SODIUM 40 MG/0.4ML ~~LOC~~ SOLN
40.0000 mg | Freq: Every day | SUBCUTANEOUS | Status: DC
Start: 2017-01-25 — End: 2017-01-31
  Administered 2017-01-26 – 2017-01-30 (×5): 40 mg via SUBCUTANEOUS
  Filled 2017-01-25 (×6): qty 0.4

## 2017-01-25 MED ORDER — ONDANSETRON HCL 4 MG/2ML IJ SOLN
4.0000 mg | Freq: Once | INTRAMUSCULAR | Status: AC
  Administered 2017-01-25: 4 mg via INTRAVENOUS
  Filled 2017-01-25: qty 2

## 2017-01-25 MED ORDER — LAMOTRIGINE 100 MG PO TABS
200.0000 mg | ORAL_TABLET | Freq: Every morning | ORAL | Status: DC
  Administered 2017-01-26 – 2017-01-31 (×6): 200 mg via ORAL
  Filled 2017-01-25 (×6): qty 2

## 2017-01-25 MED ORDER — SODIUM CHLORIDE 0.9 % IV BOLUS (SEPSIS)
1000.0000 mL | Freq: Once | INTRAVENOUS | Status: AC
  Administered 2017-01-25: 1000 mL via INTRAVENOUS

## 2017-01-25 MED ORDER — MORPHINE SULFATE (PF) 4 MG/ML IV SOLN
4.0000 mg | Freq: Once | INTRAVENOUS | Status: AC
  Administered 2017-01-25: 4 mg via INTRAVENOUS
  Filled 2017-01-25: qty 1

## 2017-01-25 MED ORDER — PIPERACILLIN-TAZOBACTAM 3.375 G IVPB 30 MIN
3.3750 g | Freq: Once | INTRAVENOUS | Status: AC
  Administered 2017-01-25: 3.375 g via INTRAVENOUS
  Filled 2017-01-25 (×2): qty 50

## 2017-01-25 MED ORDER — ONDANSETRON HCL 4 MG PO TABS: 4.0000 mg | ORAL_TABLET | Freq: Four times a day (QID) | ORAL | Status: DC | PRN

## 2017-01-25 MED ORDER — OXYCODONE-ACETAMINOPHEN 5-325 MG PO TABS
1.0000 | ORAL_TABLET | Freq: Four times a day (QID) | ORAL | Status: DC | PRN
  Administered 2017-01-26 – 2017-01-27 (×6): 2 via ORAL
  Filled 2017-01-25 (×6): qty 2

## 2017-01-25 MED ORDER — ONDANSETRON HCL 4 MG/2ML IJ SOLN
4.0000 mg | Freq: Four times a day (QID) | INTRAMUSCULAR | Status: DC | PRN
Start: 2017-01-25 — End: 2017-01-31
  Administered 2017-01-29: 4 mg via INTRAVENOUS

## 2017-01-25 MED ORDER — CLONAZEPAM 1 MG PO TABS
1.0000 mg | ORAL_TABLET | Freq: Two times a day (BID) | ORAL | Status: DC | PRN
  Administered 2017-01-26 – 2017-01-31 (×9): 1 mg via ORAL
  Filled 2017-01-25 (×9): qty 1

## 2017-01-25 MED ORDER — FLUOXETINE HCL 20 MG PO CAPS
20.0000 mg | ORAL_CAPSULE | Freq: Every day | ORAL | Status: DC
Start: 2017-01-26 — End: 2017-01-31
  Administered 2017-01-26 – 2017-01-31 (×6): 20 mg via ORAL
  Filled 2017-01-25 (×6): qty 1

## 2017-01-25 MED ORDER — TAMSULOSIN HCL 0.4 MG PO CAPS
0.4000 mg | ORAL_CAPSULE | Freq: Every day | ORAL | Status: DC
  Administered 2017-01-26 – 2017-01-30 (×5): 0.4 mg via ORAL
  Filled 2017-01-25 (×5): qty 1

## 2017-01-25 MED ORDER — ACETAMINOPHEN 650 MG RE SUPP: 650.0000 mg | Freq: Four times a day (QID) | RECTAL | Status: DC | PRN

## 2017-01-25 MED ORDER — ATENOLOL 25 MG PO TABS
25.0000 mg | ORAL_TABLET | Freq: Every day | ORAL | Status: DC
  Administered 2017-01-27 – 2017-01-30 (×4): 25 mg via ORAL
  Filled 2017-01-25 (×6): qty 1

## 2017-01-25 MED ORDER — ACETAMINOPHEN 325 MG PO TABS: 650.0000 mg | ORAL_TABLET | Freq: Four times a day (QID) | ORAL | Status: DC | PRN

## 2017-01-25 MED ORDER — PIPERACILLIN-TAZOBACTAM 3.375 G IVPB
3.3750 g | Freq: Three times a day (TID) | INTRAVENOUS | Status: DC
  Administered 2017-01-26 – 2017-01-28 (×8): 3.375 g via INTRAVENOUS
  Filled 2017-01-25 (×8): qty 50

## 2017-01-25 MED ORDER — AMLODIPINE BESYLATE 10 MG PO TABS
10.0000 mg | ORAL_TABLET | Freq: Every morning | ORAL | Status: DC
  Administered 2017-01-27 – 2017-01-31 (×5): 10 mg via ORAL
  Filled 2017-01-25 (×6): qty 1

## 2017-01-25 NOTE — ED Notes (Signed)
Attempted report second time. No answer after being transferred.

## 2017-01-25 NOTE — ED Triage Notes (Signed)
Patient states that he has had multiple surgeries to his left leg and foot  - patient states that he has a wound to the area and started to have acute pain to it about an hour and half ago. He is to have surgery on the 31st of this month

## 2017-01-25 NOTE — Progress Notes (Signed)
Pharmacy Antibiotic Note  Travis Salinas Travis Salinas is a 66 y.o. male with hx of left ankle infection now with worsening redness, swelling and purulent discharge admitted on 01/25/2017 with cellulitis.  Pharmacy has been consulted for zosy/vancomycin dosing.  Plan: Zosyn 3.375g IV q8h (4 hour infusion).  Vancomycin 1 Gm x1 then 500 mg IV q12h VT=15-20 mg/L Daily Scr     Temp (24hrs), Avg:98.7 F (37.1 C), Min:98.5 F (36.9 C), Max:98.8 F (37.1 C)   Recent Labs Lab 01/25/17 1642  WBC 13.7*  CREATININE 1.34*    CrCl cannot be calculated (Unknown ideal weight.).    Allergies  Allergen Reactions  . Propoxyphene Nausea And Vomiting  . Ciprofloxacin Nausea Only  . Depakote [Divalproex Sodium] Other (See Comments)    hallucinations    Antimicrobials this admission: 5/19 zosyn >>  5/19 vancomycin >>   Dose adjustments this admission:   Microbiology results:  BCx:   UCx:    Sputum:    MRSA PCR:   Thank you for allowing pharmacy to be a part of this patient's care.  Dorrene German 01/25/2017 11:01 PM

## 2017-01-25 NOTE — ED Notes (Signed)
Pt states he has an infection in his left ankle that was treated with antibiotics on Feb 25th. Then on March 29th, he had a wound vac placed. Seen in office yesterday and told to continue cream. Presents today with increased swelling, redness and purulent drainage noted. Moves toes. Feels touch except on medial aspect of foot. Cap refill < 3 sec.

## 2017-01-25 NOTE — ED Notes (Signed)
Unsuccessful IV attempt x1 to pt's L forearm. (Blood return noted; infiltrated with flushing.)

## 2017-01-25 NOTE — ED Notes (Signed)
ED Provider at bedside. 

## 2017-01-25 NOTE — ED Provider Notes (Signed)
[facility] Provider Note   CSN: [id number] Arrival date & time: 01/25/17  1527  By signing my name below, I, Jaquelyn Bitter., attest that this documentation has been prepared under the direction and in the presence of Cristal Ford, DO. Electronically signed: Jaquelyn Bitter., ED Scribe. 01/26/17. 10:37 PM.   History   Chief Complaint Chief Complaint  Patient presents with  . Leg Pain    HPI [facility] [name] is a 66 y.o. male with pshx to the L lower leg who presents to the Emergency Department complaining of worsening, moderate L leg pain with onset x1 day. Per wife, pt had an abscess drained to the L lower leg x2 months ago the incision could not be closed so he was told to use Collagen cream and scheduled for follow-up on May 7th. Pt was seen at the wound clinic x1 day ago and told that his tendon could be dead but to continue Collagen cream until surgery on May 31st. Per wife, x2 hours ago pt complained of the L lower leg burning and swelling. Wife states that the swelling occurred over night as there was no swelling present x1 day ago. Pt denies any modifying factors. He denies fever. Of note, pt reports past antibiotic use from March 15th to April 15. He reports known allergy to Ciprofloxacin, Depakote, Propoxyphene   The history is provided by the spouse and the patient. No language interpreter was used.  Leg Pain   This is a new problem. The current episode started 12 to 24 hours ago. The problem has been rapidly worsening. The pain is present in the left lower leg. Quality: burning. The pain is moderate. He has tried nothing for the symptoms. The treatment provided no relief. There has been no history of extremity trauma.    Past Medical History:  Diagnosis Date  . Arthritis   . Cervical disc herniation   . COPD (chronic obstructive pulmonary disease) ([place])   . Headache    " due to antibiotics"  . Hepatitis C   . Hypertension      Patient Active Problem List   Diagnosis Date Noted  . Medication monitoring encounter 11/25/2016  . Medication side effect 11/25/2016  . Tenosynovitis of left ankle 11/09/2016  . Septic arthritis of ankle ([place]) 11/09/2016  . Abscess 11/03/2016  . Acute renal failure (ARF) ([place]) 11/03/2016  . Leukocytosis 11/03/2016  . Cellulitis of left lower extremity   . IVDU (intravenous drug user)   . Abscess of ankle   . Liver fibrosis 04/24/2016  . Chronic hepatitis C without hepatic coma ([place]) 04/19/2015  . Substance abuse 04/19/2015  . Neck pain, chronic 04/19/2015  . Cellulitis 11/12/2013  . HTN (hypertension) 11/12/2013  . Depression 11/12/2013  . Abscess of calf, right 11/12/2013    Past Surgical History:  Procedure Laterality Date  . APPLICATION OF A-CELL OF EXTREMITY Left 12/05/2016   Procedure: APPLICATION OF A-CELL OF EXTREMITY;  Surgeon: Loel Lofty Dillingham, DO;  Location: [place];  Service: Plastics;  Laterality: Left;  . APPLICATION OF WOUND VAC Left 12/05/2016   Procedure: APPLICATION OF WOUND VAC;  Surgeon: Loel Lofty Dillingham, DO;  Location: [place];  Service: Plastics;  Laterality: Left;  . I&D EXTREMITY Left 11/03/2016   Procedure: IRRIGATION AND DEBRIDEMENT EXTREMITY;  Surgeon: Leandrew Koyanagi, MD;  Location: [place];  Service: Orthopedics;  Laterality: Left;  . I&D EXTREMITY Left 12/05/2016   Procedure: IRRIGATION AND DEBRIDEMENT EXTREMITY;  Surgeon:  Loel Lofty Dillingham, DO;  Location: [place];  Service: Plastics;  Laterality: Left;  . INCISION AND DRAINAGE ABSCESS Right 11/12/2013   Procedure: INCISION AND DRAINAGE AND OPEN PACKING OF RIGHT CALF  ABSCESS;  Surgeon: Earnstine Regal, MD;  Location: [facility];  Service: General;  Laterality: Right;  . KNEE SURGERY         Home Medications    Prior to Admission medications   Medication Sig Start Date End Date Taking? Authorizing Provider  amLODipine (NORVASC) 10 MG tablet Take 10 mg by mouth every morning.   Yes [provider]  ARIPiprazole (ABILIFY) 15 MG tablet Take 15 mg by mouth every morning.  10/12/15  Yes [provider]  Ascorbic Acid (VITAMIN C) 100 MG tablet Take 500 mg by mouth every morning.   Yes [provider]  aspirin EC 81 MG tablet Take 1 tablet (81 mg total) by mouth daily. Patient taking differently: Take 81 mg by mouth every morning.  09/17/16  Yes Jerline Pain, MD  atenolol (TENORMIN) 50 MG tablet Take 25 mg by mouth every morning.  09/30/16  Yes [provider]  carisoprodol (SOMA) 250 MG tablet Take 250 mg by mouth every 8 (eight) hours as needed (neck pain, spasms).  12/26/16  Yes [provider]  clonazePAM (KLONOPIN) 1 MG tablet Take 1 tablet (1 mg total) by mouth 2 (two) times daily as needed for anxiety. Patient taking differently: Take 1 mg by mouth 2 (two) times daily.  11/08/16  Yes Lavina Hamman, MD  FLUoxetine (PROZAC) 20 MG capsule Take 1 capsule (20 mg total) by mouth daily. Patient taking differently: Take 20 mg by mouth every morning.  11/09/16  Yes Lavina Hamman, MD  lamoTRIgine (LAMICTAL) 200 MG tablet Take 200 mg by mouth every morning.   Yes [provider]  Multiple Vitamin (MULTIVITAMIN) capsule Take 1 capsule by mouth every morning.   Yes [provider]  oxyCODONE (OXYCONTIN) 40 mg 12 hr tablet Take 40 mg by mouth every 8 (eight) hours as needed (pain).  01/15/17  Yes [provider]  PROAIR HFA 108 (90 Base) MCG/ACT inhaler Inhale 2 puffs into the lungs every 6 (six) hours as needed for wheezing or shortness of breath.  12/27/16  Yes [provider]  RAPAFLO 8 MG CAPS capsule Take 8 mg by mouth daily with breakfast.  11/20/15  Yes [provider]  sildenafil (VIAGRA) 50 MG tablet Take 50 mg by mouth as needed for erectile dysfunction.  01/20/17  Yes [provider]  zinc gluconate 50 MG tablet Take 250 mg by mouth every morning.   Yes [provider]    Family History Family  History  Problem Relation Age of Onset  . Heart disease Mother   . Stroke Mother   . Heart disease Father   . Stroke Father     Social History Social History  Substance Use Topics  . Smoking status: Former Smoker    Packs/day: 0.50    Years: 25.00    Types: Cigarettes  . Smokeless tobacco: Never Used  . Alcohol use No     Allergies   Propoxyphene; Ciprofloxacin; and Depakote [divalproex sodium]   Review of Systems Review of Systems  Constitutional: Negative for fever.  Cardiovascular: Positive for leg swelling (L).  Musculoskeletal: Positive for myalgias (L lower leg).  All other systems reviewed and are negative.    Physical Exam Updated Vital Signs BP (!) 143/72 (BP  Location: Right Arm)   Pulse 72   Temp 97.8 F (36.6 C) (Oral)   Resp 19   Ht 6' (1.829 m)   Wt 156 lb (70.8 kg)   SpO2 97%   BMI 21.16 kg/m  Vitals reviewed Physical Exam Physical Examination: General appearance - alert, well appearing, and in no distress Mental status - alert, oriented to person, place, and time Eyes - no conjunctival injection, no scleral icterus Chest - clear to auscultation, no wheezes, rales or rhonchi, symmetric air entry Heart - normal rate, regular rhythm, normal S1, S2, no murmurs, rubs, clicks or gallops Abdomen - soft, nontender, nondistended, no masses or organomegaly Neurological - alert, oriented, normal speech Musculoskeletal - no joint tenderness, deformity or swelling Extremities - peripheral pulses normal, no pedal edema, no clubbing or cyanosis Skin - normal coloration and turgor, no rashes- other than on left lower extremity there is approx 6cm area of erythema surrounding chronic approx 1cm open wound with exposed tendon- white granulation tissue present, no fluctuance to suggest abscess, cellutis area marked with indelible marker  ED Treatments / Results   DIAGNOSTIC STUDIES: Oxygen Saturation is 98% on RA, normal by my interpretation.    COORDINATION OF CARE: 10:37 PM-Discussed next steps with pt. Pt verbalized understanding and is agreeable with the plan.    Labs (all labs ordered are listed, but only abnormal results are displayed) Labs Reviewed  CBC WITH DIFFERENTIAL/PLATELET - Abnormal; Notable for the following:       Result Value   WBC 13.7 (*)    RBC 4.09 (*)    Hemoglobin 11.5 (*)    HCT 36.6 (*)    Neutro Abs 8.0 (*)    Monocytes Absolute 1.6 (*)    All other components within normal limits  BASIC METABOLIC PANEL - Abnormal; Notable for the following:    Creatinine, Ser 1.34 (*)    GFR calc non Af Amer 54 (*)    All other components within normal limits  CBC WITH DIFFERENTIAL/PLATELET - Abnormal; Notable for the following:    WBC 11.7 (*)    RBC 3.97 (*)    Hemoglobin 11.0 (*)    HCT 35.8 (*)    Lymphs Abs 4.4 (*)    Monocytes Absolute 1.8 (*)    All other components within normal limits  COMPREHENSIVE METABOLIC PANEL - Abnormal; Notable for the following:    Glucose, Bld 111 (*)    Calcium 8.4 (*)    Total Protein 6.0 (*)    Albumin 3.4 (*)    ALT 10 (*)    All other components within normal limits  RAPID URINE DRUG SCREEN, HOSP PERFORMED - Abnormal; Notable for the following:    Opiates POSITIVE (*)    Cocaine POSITIVE (*)    All other components within normal limits  CULTURE, BLOOD (ROUTINE X 2)  AEROBIC/ANAEROBIC CULTURE (SURGICAL/DEEP WOUND)  CULTURE, BLOOD (ROUTINE X 2)  PROTIME-INR  URINALYSIS, ROUTINE W REFLEX MICROSCOPIC  CBC  BASIC METABOLIC PANEL    EKG  EKG Interpretation None       Radiology Dg Tibia/fibula Left  Result Date: 01/25/2017 CLINICAL DATA:  Open wound to anterior distal 1/3 of left tibia. Wound x 3 months; has worsened and gotten larger since yest. No fever. EXAM: LEFT TIBIA AND FIBULA - 2 VIEW COMPARISON:  Ankle radiographs, 11/03/2016 FINDINGS: No fracture. No bone lesion. No areas of bone resorption is seen to suggest osteomyelitis. There is  nonspecific subcutaneous edema and/or cellulitis extending from  the mid leg to the ankle. No soft tissue air. IMPRESSION: 1. No fracture or bone lesion. 2. No evidence of osteomyelitis. 3. No soft tissue air. Electronically Signed   By: Lajean Manes M.D.   On: 01/25/2017 17:54    Procedures Procedures (including critical care time)  Medications Ordered in ED Medications  oxyCODONE-acetaminophen (PERCOCET/ROXICET) 5-325 MG per tablet 1-2 tablet (2 tablets Oral Given 01/26/17 2219)  atenolol (TENORMIN) tablet 25 mg (25 mg Oral Not Given 01/26/17 1007)  clonazePAM (KLONOPIN) tablet 1 mg (1 mg Oral Given 01/26/17 1720)  FLUoxetine (PROZAC) capsule 20 mg (20 mg Oral Given 01/26/17 1003)  aspirin EC tablet 81 mg (81 mg Oral Given 01/26/17 1004)  lamoTRIgine (LAMICTAL) tablet 200 mg (200 mg Oral Given 01/26/17 1004)  ARIPiprazole (ABILIFY) tablet 15 mg (15 mg Oral Given 01/26/17 1005)  tamsulosin (FLOMAX) capsule 0.4 mg (0.4 mg Oral Given 01/26/17 1720)  amLODipine (NORVASC) tablet 10 mg (10 mg Oral Not Given 01/26/17 1007)  enoxaparin (LOVENOX) injection 40 mg (40 mg Subcutaneous Given 01/26/17 2223)  acetaminophen (TYLENOL) tablet 650 mg (not administered)    Or  acetaminophen (TYLENOL) suppository 650 mg (not administered)  ondansetron (ZOFRAN) tablet 4 mg (not administered)    Or  ondansetron (ZOFRAN) injection 4 mg (not administered)  0.9 %  sodium chloride infusion ( Intravenous New Bag/Given 01/26/17 0015)  piperacillin-tazobactam (ZOSYN) IVPB 3.375 g (0 g Intravenous Stopped 01/26/17 1958)  vancomycin (VANCOCIN) IVPB 1000 mg/200 mL premix (0 mg Intravenous Stopped 01/26/17 1700)  vancomycin (VANCOCIN) IVPB 1000 mg/200 mL premix (0 mg Intravenous Stopped 01/25/17 1856)  piperacillin-tazobactam (ZOSYN) IVPB 3.375 g (0 g Intravenous Stopped 01/25/17 1754)  morphine 4 MG/ML injection 4 mg (4 mg Intravenous Given 01/25/17 1709)  ondansetron (ZOFRAN) injection 4 mg (4 mg Intravenous Given 01/25/17 1704)   sodium chloride 0.9 % bolus 1,000 mL (1,000 mLs Intravenous Transfusing/Transfer 01/25/17 2115)  morphine 4 MG/ML injection 4 mg (4 mg Intravenous Given 01/25/17 2010)     Initial Impression / Assessment and Plan / ED Course  I have reviewed the triage vital signs and the nursing notes.  Pertinent labs & imaging results that were available during my care of the patient were reviewed by me and considered in my medical decision making (see chart for details).    7:59 PM d/w Dr. Roel Cluck for transfer to [facility], she has accepted patient for IV abx, he will be admitted to med/surg bed.  D/w patient and this is what he would prefer.    Pt presenting with acute onset of redness and pain of chronic wound of left lower extremity- pt has elevation of WBC, pt started on IV abx, blood cultures and wound cultures obtained.  Currently has no abscess, xray shows no osteomyelitis or gas in tissues.  Pt has had complicated history with this wound- so will admit for further management.  Pt is agreeable with this plan.    Final Clinical Impressions(s) / ED Diagnoses   Final diagnoses:  Cellulitis of left lower extremity  Leukocytosis, unspecified type    New Prescriptions Current Discharge Medication List     I personally performed the services described in this documentation, which was scribed in my presence. The recorded information has been reviewed and is accurate.      Alfonzo Beers, MD 01/26/17 2250

## 2017-01-25 NOTE — H&P (Signed)
History and Physical    Italo Banton FKC:[id number] DOB: Jan 13, 1951 DOA: 01/25/2017  PCP: Kathyrn Lass, MD  Plastic Surgery: [facility]  Patient coming from: [facility]  Chief Complaint: Acute skin changes related to chronic wound  HPI: [facility] [name] is a 66 y.o. gentleman with a history of HTN, hepatitis C (treated), COPD, and IV drug use (he says that he is clean now) who has had a chronic wound just above his left ankle since February.  Initial injury was related to IV drug injection.  He has had multiple surgeries since February, most recent with Dr. Marla Roe on 3/29.  He wore a wound vac for about one month after that, and they have been trying other wound management modalities to move him closer to complete closure.  He is actually scheduled for surgery on 5/31.  The patient reports acute spontaneous skin changes today.  He denies any recurrent injury or triggers.  He reports increased drainage, increase pain, increased odor, increased redness.  No fever, chills, or sweats.  No nausea or vomiting.  ED Course: Xray of left leg does not show fracture, osteo, or soft tissue free air.  Blood cultures are pending.  WBC count 13.7.  Hgb 11.5.  Creatinine 1.34 (baseline around 1.1).  The patient received vanc and zosyn as well as several dose of IV morphine in the ED at [facility].  Hospitalist asked to admit.  Review of Systems: As per HPI otherwise 10 systems reviewed and negative.   Past Medical History:  Diagnosis Date  . Arthritis   . Cervical disc herniation   . COPD (chronic obstructive pulmonary disease) ([place])   . Headache    " due to antibiotics"  . Hepatitis C   . Hypertension     Past Surgical History:  Procedure Laterality Date  . APPLICATION OF A-CELL OF EXTREMITY Left 12/05/2016   Procedure: APPLICATION OF A-CELL OF EXTREMITY;  Surgeon: Loel Lofty Dillingham, DO;  Location: [place];  Service: Plastics;  Laterality: Left;  . APPLICATION OF WOUND VAC Left  12/05/2016   Procedure: APPLICATION OF WOUND VAC;  Surgeon: Loel Lofty Dillingham, DO;  Location: [place];  Service: Plastics;  Laterality: Left;  . I&D EXTREMITY Left 11/03/2016   Procedure: IRRIGATION AND DEBRIDEMENT EXTREMITY;  Surgeon: Leandrew Koyanagi, MD;  Location: [place];  Service: Orthopedics;  Laterality: Left;  . I&D EXTREMITY Left 12/05/2016   Procedure: IRRIGATION AND DEBRIDEMENT EXTREMITY;  Surgeon: Loel Lofty Dillingham, DO;  Location: [place];  Service: Plastics;  Laterality: Left;  . INCISION AND DRAINAGE ABSCESS Right 11/12/2013   Procedure: INCISION AND DRAINAGE AND OPEN PACKING OF RIGHT CALF  ABSCESS;  Surgeon: Earnstine Regal, MD;  Location: [facility];  Service: General;  Laterality: Right;  . KNEE SURGERY       reports that he has quit smoking. His smoking use included Cigarettes. He has a 12.50 pack-year smoking history. He has never used smokeless tobacco. He reports that he uses drugs, including Heroin. He reports that he does not drink alcohol.  He says that he has abstained from IV drug use since February. He is married.  Allergies  Allergen Reactions  . Propoxyphene Nausea And Vomiting  . Ciprofloxacin Nausea Only  . Depakote [Divalproex Sodium] Other (See Comments)    hallucinations    Family History  Problem Relation Age of Onset  . Heart disease Mother   . Stroke Mother   . Heart disease Father   . Stroke  Father      Prior to Admission medications   Medication Sig Start Date End Date Taking? Authorizing Provider  amLODipine (NORVASC) 10 MG tablet Take 10 mg by mouth every morning.    [provider]  ARIPiprazole (ABILIFY) 15 MG tablet Take 15 mg by mouth daily. 10/12/15   [provider]  aspirin EC 81 MG tablet Take 1 tablet (81 mg total) by mouth daily. 09/17/16   Jerline Pain, MD  atenolol (TENORMIN) 50 MG tablet Take 25 mg by mouth daily. 09/30/16   [provider]  clonazePAM (KLONOPIN) 1 MG tablet Take 1 tablet (1 mg total) by mouth 2 (two)  times daily as needed for anxiety. 11/08/16   Lavina Hamman, MD  FLUoxetine (PROZAC) 20 MG capsule Take 1 capsule (20 mg total) by mouth daily. 11/09/16   Lavina Hamman, MD  lamoTRIgine (LAMICTAL) 200 MG tablet Take 200 mg by mouth every morning.    [provider]  RAPAFLO 8 MG CAPS capsule Take 8 mg by mouth daily. 11/20/15   [provider]    Physical Exam: Vitals:   01/25/17 2016 01/25/17 2030 01/25/17 2050 01/25/17 2118  BP:  (!) 147/75  (!) 148/78  Pulse: 62  (!) 59 63  Resp:    18  Temp:      TempSrc:      SpO2: 93%  94% 94%      Constitutional: NAD, calm, comfortable Vitals:   01/25/17 2016 01/25/17 2030 01/25/17 2050 01/25/17 2118  BP:  (!) 147/75  (!) 148/78  Pulse: 62  (!) 59 63  Resp:    18  Temp:      TempSrc:      SpO2: 93%  94% 94%   Eyes: PERRL, lids and conjunctivae normal ENMT: Mucous membranes are moist. Posterior pharynx clear of any exudate or lesions. Normal dentition.  Neck: normal appearance, supple, no masses Respiratory: clear to auscultation bilaterally, no wheezing, no crackles. Normal respiratory effort. No accessory muscle use.  Cardiovascular: Normal rate, regular rhythm, no murmurs / rubs / gallops. No extremity edema. 2+ pedal pulses. GI: abdomen is soft and compressible.  No distention.  No tenderness.  Bowel sounds are present. Musculoskeletal:  No joint deformity in upper and lower extremities. Good ROM, no contractures. Normal muscle tone.  Skin: no rashes, warm and dry. Just above the left ankle, on the anterior aspect of the leg, there is a small opening with yellow-green purulent drainage with an odor.  There is surrounding erythema and induration.  There is streaking above and below the wound. Neurologic: CN 2-12 grossly intact. Sensation intact, Strength symmetric bilaterally, 5/5. Psychiatric: Hard of hearing but normal judgment and insight. Alert and oriented x 3. Normal mood.     Labs on Admission: I have  personally reviewed following labs and imaging studies  CBC:  Recent Labs Lab 01/25/17 1642  WBC 13.7*  NEUTROABS 8.0*  HGB 11.5*  HCT 36.6*  MCV 89.5  PLT PLATELET CLUMPS NOTED ON SMEAR, COUNT APPEARS ADEQUATE   Basic Metabolic Panel:  Recent Labs Lab 01/25/17 1642  NA 139  K 4.9  CL 107  CO2 25  GLUCOSE 89  BUN 17  CREATININE 1.34*  CALCIUM 9.2   GFR: CrCl cannot be calculated (Unknown ideal weight.).    Radiological Exams on Admission: Dg Tibia/fibula Left  Result Date: 01/25/2017 CLINICAL DATA:  Open wound to anterior distal 1/3 of left tibia. Wound x 3 months; has worsened  and gotten larger since yest. No fever. EXAM: LEFT TIBIA AND FIBULA - 2 VIEW COMPARISON:  Ankle radiographs, 11/03/2016 FINDINGS: No fracture. No bone lesion. No areas of bone resorption is seen to suggest osteomyelitis. There is nonspecific subcutaneous edema and/or cellulitis extending from the mid leg to the ankle. No soft tissue air. IMPRESSION: 1. No fracture or bone lesion. 2. No evidence of osteomyelitis. 3. No soft tissue air. Electronically Signed   By: Lajean Manes M.D.   On: 01/25/2017 17:54    Assessment/Plan Principal Problem:   Cellulitis Active Problems:   HTN (hypertension)   Depression   Abscess   Leukocytosis      Left lower extremity celluitis/abscess; history of IV drug injection, chronic, slow-healing wound --IV vanc and zosyn for now --Dr. Iran Planas on call; will see patient in the AM --No additional imaging recommended at this time --Analgesics as needed  HTN --Atenolol, norvasc  BPH --Flomax substituted for Rapaflo  Mood disorder --Prozac, lamictal, klonopin prn, Ability   DVT prophylaxis: Lovenox Code Status: FULL Family Communication: Wife present in the ED at time of admission. Disposition Plan: Expect he will go home when ready for discharge. Consults called: Plastic Surgery (Dr. Iran Planas covering for Dr. Marla Roe); I called her  tonight. Admission status: Inpatient, med surg   TIME SPENT: 65 minutes   Eber Jones MD [facility] Pager [phone]  If 7PM-7AM, please contact night-coverage [email or web address] Password [email or web address]  01/25/2017, 10:45 PM

## 2017-01-25 NOTE — ED Notes (Signed)
Attempted report with floor at [facility]. No answer.

## 2017-01-26 DIAGNOSIS — F329 Major depressive disorder, single episode, unspecified: Secondary | ICD-10-CM

## 2017-01-26 LAB — CBC WITH DIFFERENTIAL/PLATELET
BASOS ABS: 0.1 10*3/uL (ref 0.0–0.1)
Basophils Relative: 0 %
EOS PCT: 5 %
Eosinophils Absolute: 0.6 10*3/uL (ref 0.0–0.7)
HCT: 35.8 % — ABNORMAL LOW (ref 39.0–52.0)
Hemoglobin: 11 g/dL — ABNORMAL LOW (ref 13.0–17.0)
Lymphocytes Relative: 37 %
Lymphs Abs: 4.4 10*3/uL — ABNORMAL HIGH (ref 0.7–4.0)
MCH: 27.7 pg (ref 26.0–34.0)
MCHC: 30.7 g/dL (ref 30.0–36.0)
MCV: 90.2 fL (ref 78.0–100.0)
Monocytes Absolute: 1.8 10*3/uL — ABNORMAL HIGH (ref 0.1–1.0)
Monocytes Relative: 15 %
NEUTROS ABS: 4.9 10*3/uL (ref 1.7–7.7)
Neutrophils Relative %: 43 %
PLATELETS: 276 10*3/uL (ref 150–400)
RBC: 3.97 MIL/uL — AB (ref 4.22–5.81)
RDW: 15 % (ref 11.5–15.5)
WBC: 11.7 10*3/uL — AB (ref 4.0–10.5)

## 2017-01-26 LAB — COMPREHENSIVE METABOLIC PANEL
ALBUMIN: 3.4 g/dL — AB (ref 3.5–5.0)
ALT: 10 U/L — AB (ref 17–63)
AST: 16 U/L (ref 15–41)
Alkaline Phosphatase: 57 U/L (ref 38–126)
Anion gap: 8 (ref 5–15)
BUN: 17 mg/dL (ref 6–20)
CHLORIDE: 107 mmol/L (ref 101–111)
CO2: 25 mmol/L (ref 22–32)
Calcium: 8.4 mg/dL — ABNORMAL LOW (ref 8.9–10.3)
Creatinine, Ser: 1.2 mg/dL (ref 0.61–1.24)
GFR calc Af Amer: >60 mL/min (ref >60)
GFR calc non Af Amer: >60 mL/min (ref >60)
GLUCOSE: 111 mg/dL — AB (ref 65–99)
Potassium: 3.9 mmol/L (ref 3.5–5.1)
Sodium: 140 mmol/L (ref 135–145)
Total Bilirubin: 0.3 mg/dL (ref 0.3–1.2)
Total Protein: 6 g/dL — ABNORMAL LOW (ref 6.5–8.1)

## 2017-01-26 LAB — URINALYSIS, ROUTINE W REFLEX MICROSCOPIC
BILIRUBIN URINE: NEGATIVE
GLUCOSE, UA: NEGATIVE mg/dL
HGB URINE DIPSTICK: NEGATIVE
Ketones, ur: NEGATIVE mg/dL
Leukocytes, UA: NEGATIVE
Nitrite: NEGATIVE
PH: 5 (ref 5.0–8.0)
Protein, ur: NEGATIVE mg/dL
Specific Gravity, Urine: 1.017 (ref 1.005–1.030)

## 2017-01-26 LAB — RAPID URINE DRUG SCREEN, HOSP PERFORMED
AMPHETAMINES: NOT DETECTED
Barbiturates: NOT DETECTED
Benzodiazepines: NOT DETECTED
Cocaine: POSITIVE — AB
Opiates: POSITIVE — AB
Tetrahydrocannabinol: NOT DETECTED

## 2017-01-26 LAB — PROTIME-INR
INR: 1.03
Prothrombin Time: 13.5 seconds (ref 11.4–15.2)

## 2017-01-26 MED ORDER — VANCOMYCIN HCL IN DEXTROSE 1-5 GM/200ML-% IV SOLN
1000.0000 mg | Freq: Two times a day (BID) | INTRAVENOUS | Status: DC
  Administered 2017-01-26 – 2017-01-28 (×4): 1000 mg via INTRAVENOUS
  Filled 2017-01-26 (×4): qty 200

## 2017-01-26 MED ORDER — VANCOMYCIN HCL 500 MG IV SOLR
500.0000 mg | Freq: Two times a day (BID) | INTRAVENOUS | Status: DC
  Administered 2017-01-26: 500 mg via INTRAVENOUS
  Filled 2017-01-26 (×2): qty 500

## 2017-01-26 NOTE — Progress Notes (Signed)
PROGRESS NOTE    Travis Salinas  TOI:[id number] DOB: 16-Oct-1950 DOA: 01/25/2017 PCP: Kathyrn Lass, MD   Chief Complaint  Patient presents with  . Leg Pain    Brief Narrative:  HPI on 01/25/2017 by Dr. Rosealee Albee Travis Salinas is a 66 y.o. gentleman with a history of HTN, hepatitis C (treated), COPD, and IV drug use (he says that he is clean now) who has had a chronic wound just above his left ankle since February.  Initial injury was related to IV drug injection.  He has had multiple surgeries since February, most recent with Dr. Marla Roe on 3/29.  He wore a wound vac for about one month after that, and they have been trying other wound management modalities to move him closer to complete closure.  He is actually scheduled for surgery on 5/31.  The patient reports acute spontaneous skin changes today.  He denies any recurrent injury or triggers.  He reports increased drainage, increase pain, increased odor, increased redness.  No fever, chills, or sweats.  No nausea or vomiting. Assessment & Plan   Left lower leg extremity abscess/chronic slow healing wound/cellulitis -Patient has been following up with Dr. Marla Roe, plastic surgery, as an outpatient -Plastics consulted and appreciated -Patient presented to the emergency department with complaints of swelling and increased pain which started on Saturday. Patient recently saw Dr. Marla Roe on 01/24/2017. -Continue IV vancomycin and Zosyn -Dr. Iran Planas preformed bedside debridement with iodoform packing and recommended continuing antibiotics as well as leg elevation. Wound cultures that were obtained or likely contaminated, recommended possibly switching patient to ceftriaxone or Ancef over the next day. -Blood cultures pending  Essential hypertension -Continue amlodipine, atenolol  BPH -Continue Flomax  Mood disorder, anxiety and depression -Continue Prozac, Lamictal, Abilify, Klonopin as needed  Drug abuse -Tox screen  positive for cocaine and opiates  DVT Prophylaxis  Lovenox  Code Status: Full  Family Communication: None  Disposition Plan: Admitted, pending further recommendations from plastic surgery  Consultants Plastic surgery  Procedures  Bedside debridement  Antibiotics   Anti-infectives    Start     Dose/Rate Route Frequency Ordered Stop   01/26/17 0800  vancomycin (VANCOCIN) 500 mg in sodium chloride 0.9 % 100 mL IVPB     500 mg 100 mL/hr over 60 Minutes Intravenous Every 12 hours 01/26/17 0102     01/25/17 2359  piperacillin-tazobactam (ZOSYN) IVPB 3.375 g     3.375 g 12.5 mL/hr over 240 Minutes Intravenous Every 8 hours 01/25/17 2312     01/25/17 1615  vancomycin (VANCOCIN) IVPB 1000 mg/200 mL premix     1,000 mg 200 mL/hr over 60 Minutes Intravenous  Once 01/25/17 1604 01/25/17 1856   01/25/17 1615  piperacillin-tazobactam (ZOSYN) IVPB 3.375 g     3.375 g 100 mL/hr over 30 Minutes Intravenous  Once 01/25/17 1604 01/25/17 1754      Subjective:   Travis Salinas seen and examined today.  Continues complain of leg pain and swelling. States this started suddenly on Saturday. Does not recall any trauma to this area. Denies chest pain, shortness of breath, abdominal pain, nausea or vomiting, diarrhea or constipation.  Objective:   Vitals:   01/25/17 2050 01/25/17 2118 01/25/17 2300 01/26/17 0507  BP:  (!) 148/78 127/60 (!) 114/55  Pulse: (!) 59 63 61 65  Resp:  18 16 15   Temp:   97.7 F (36.5 C) 97.9 F (36.6 C)  TempSrc:   Oral Oral  SpO2: 94%  94% 93% 93%  Weight:   70.8 kg (156 lb)   Height:   6' (1.829 m)     Intake/Output Summary (Last 24 hours) at 01/26/17 1254 Last data filed at 01/26/17 1000  Gross per 24 hour  Intake              887 ml  Output              550 ml  Net              337 ml   Filed Weights   01/25/17 2300  Weight: 70.8 kg (156 lb)    Exam  General: Well developed, well nourished, NAD, appears stated age  HEENT: NCAT, mucous  membranes moist.   Cardiovascular: S1 S2 auscultated, no rubs, murmurs or gallops. Regular rate and rhythm.  Respiratory: Clear to auscultation bilaterally with equal chest rise  Abdomen: Soft, nontender, nondistended, + bowel sounds  Extremities: warm dry without cyanosis clubbing or edema of RLE, LLE wound with edema  Neuro: AAOx3, Nonfocal  Psych: Normal affect and demeanor    Data Reviewed: I have personally reviewed following labs and imaging studies  CBC:  Recent Labs Lab 01/25/17 1642 01/26/17 0424  WBC 13.7* 11.7*  NEUTROABS 8.0* 4.9  HGB 11.5* 11.0*  HCT 36.6* 35.8*  MCV 89.5 90.2  PLT PLATELET CLUMPS NOTED ON SMEAR, COUNT APPEARS ADEQUATE 761   Basic Metabolic Panel:  Recent Labs Lab 01/25/17 1642 01/26/17 0424  NA 139 140  K 4.9 3.9  CL 107 107  CO2 25 25  GLUCOSE 89 111*  BUN 17 17  CREATININE 1.34* 1.20  CALCIUM 9.2 8.4*   GFR: Estimated Creatinine Clearance: 62.3 mL/min (by C-G formula based on SCr of 1.2 mg/dL). Liver Function Tests:  Recent Labs Lab 01/26/17 0424  AST 16  ALT 10*  ALKPHOS 57  BILITOT 0.3  PROT 6.0*  ALBUMIN 3.4*   No results for input(s): LIPASE, AMYLASE in the last 168 hours. No results for input(s): AMMONIA in the last 168 hours. Coagulation Profile:  Recent Labs Lab 01/26/17 0424  INR 1.03   Cardiac Enzymes: No results for input(s): CKTOTAL, CKMB, CKMBINDEX, TROPONINI in the last 168 hours. BNP (last 3 results) No results for input(s): PROBNP in the last 8760 hours. HbA1C: No results for input(s): HGBA1C in the last 72 hours. CBG: No results for input(s): GLUCAP in the last 168 hours. Lipid Profile: No results for input(s): CHOL, HDL, LDLCALC, TRIG, CHOLHDL, LDLDIRECT in the last 72 hours. Thyroid Function Tests: No results for input(s): TSH, T4TOTAL, FREET4, T3FREE, THYROIDAB in the last 72 hours. Anemia Panel: No results for input(s): VITAMINB12, FOLATE, FERRITIN, TIBC, IRON, RETICCTPCT in the last  72 hours. Urine analysis:    Component Value Date/Time   COLORURINE YELLOW 01/26/2017 0800   APPEARANCEUR CLEAR 01/26/2017 0800   LABSPEC 1.017 01/26/2017 0800   PHURINE 5.0 01/26/2017 0800   GLUCOSEU NEGATIVE 01/26/2017 0800   HGBUR NEGATIVE 01/26/2017 0800   BILIRUBINUR NEGATIVE 01/26/2017 0800   KETONESUR NEGATIVE 01/26/2017 0800   PROTEINUR NEGATIVE 01/26/2017 0800   UROBILINOGEN 1.0 11/14/2013 1424   NITRITE NEGATIVE 01/26/2017 0800   LEUKOCYTESUR NEGATIVE 01/26/2017 0800   Sepsis Labs: @LABRCNTIP (procalcitonin:4,lacticidven:4)  ) Recent Results (from the past 240 hour(s))  Blood culture (routine x 2)     Status: None (Preliminary result)   Collection Time: 01/25/17  4:41 PM  Result Value Ref Range Status   Specimen Description BLOOD LEFT ANTECUBITAL  Final  Special Requests IN PEDIATRIC BOTTLE Blood Culture adequate volume  Final   Culture PENDING  Incomplete   Report Status PENDING  Incomplete  Aerobic/Anaerobic Culture (surgical/deep wound)     Status: None (Preliminary result)   Collection Time: 01/25/17  4:55 PM  Result Value Ref Range Status   Specimen Description WOUND LEFT LOWER LEG  Final   Special Requests NONE  Final   Gram Stain   Final    FEW WBC PRESENT,BOTH PMN AND MONONUCLEAR ABUNDANT GRAM NEGATIVE RODS MODERATE GRAM POSITIVE COCCI IN PAIRS FEW GRAM POSITIVE RODS Performed at [facility], [place] [address], [place], [place] [address]    Culture PENDING  Incomplete   Report Status PENDING  Incomplete      Radiology Studies: Dg Tibia/fibula Left  Result Date: 01/25/2017 CLINICAL DATA:  Open wound to anterior distal 1/3 of left tibia. Wound x 3 months; has worsened and gotten larger since yest. No fever. EXAM: LEFT TIBIA AND FIBULA - 2 VIEW COMPARISON:  Ankle radiographs, 11/03/2016 FINDINGS: No fracture. No bone lesion. No areas of bone resorption is seen to suggest osteomyelitis. There is nonspecific subcutaneous edema and/or cellulitis  extending from the mid leg to the ankle. No soft tissue air. IMPRESSION: 1. No fracture or bone lesion. 2. No evidence of osteomyelitis. 3. No soft tissue air. Electronically Signed   By: Lajean Manes M.D.   On: 01/25/2017 17:54     Scheduled Meds: . amLODipine  10 mg Oral q morning - 10a  . ARIPiprazole  15 mg Oral Daily  . aspirin EC  81 mg Oral Daily  . atenolol  25 mg Oral Daily  . enoxaparin (LOVENOX) injection  40 mg Subcutaneous QHS  . FLUoxetine  20 mg Oral Daily  . lamoTRIgine  200 mg Oral q morning - 10a  . tamsulosin  0.4 mg Oral QPC supper   Continuous Infusions: . sodium chloride 100 mL/hr at 01/26/17 0015  . piperacillin-tazobactam (ZOSYN)  IV Stopped (01/26/17 1226)  . vancomycin Stopped (01/26/17 0921)     LOS: 1 day   Time Spent in minutes   30 minutes  Jayona Mccaig D.O. on 01/26/2017 at 12:54 PM  Between 7am to 7pm - Pager - [phone]  After 7pm go to [email or web address] - password [email or web address]  And look for the night coverage person covering for me after hours  [facility] Office  [phone]

## 2017-01-26 NOTE — Plan of Care (Signed)
Problem: Pain Managment: Goal: General experience of comfort will improve Administer pain medications as ordered. Patient has a history of heroin abuse  Monitor left leg for increase swelling and/or drainage

## 2017-01-26 NOTE — Consult Note (Signed)
[facility] Nurse wound consult note Reason for Consult: Chronic, non-healing left leg abscess (post debridement) with new onset pain and cellulitis. Wound type: Traumatic  Patient is followed by Plastics and we were simultaneously consulted for this wound by admitting Hospitalist. Plastics has since provided Nursing with wound care orders and assessed and redressed wound today. [place] nursing team will not follow, but will remain available to this patient, the nursing and medical teams.  Please re-consult if needed. Thanks, Maudie Flakes, MSN, RN, [place], Arther Abbott  Pager# [phone]

## 2017-01-26 NOTE — Progress Notes (Signed)
Pharmacy Antibiotic Note  Travis Salinas [name] is a 66 y.o. male with hx of left ankle infection now with worsening redness, swelling and purulent discharge admitted on 01/25/2017 with left lower leg abscess/cellulitis/chronic slow healing wound. Pharmacy has been consulted for Zosyn and Vancomycin dosing.  Plan: Continue Zosyn 3.375g IV q8h (infuse over 4 hours). Adjust Vancomycin to 1g IV q12h per current weight, renal function.  Vancomycin trough level if remains on > 3 days.  Daily Scr. F/u renal function, cultures, clinical course, and ability to de-escalate as appropriate.   Height: 6' (182.9 cm) Weight: 156 lb (70.8 kg) IBW/kg (Calculated) : 77.6  Temp (24hrs), Avg:98.2 F (36.8 C), Min:97.7 F (36.5 C), Max:98.8 F (37.1 C)   Recent Labs Lab 01/25/17 1642 01/26/17 0424  WBC 13.7* 11.7*  CREATININE 1.34* 1.20    Estimated Creatinine Clearance: 62.3 mL/min (by C-G formula based on SCr of 1.2 mg/dL).    Allergies  Allergen Reactions  . Propoxyphene Nausea And Vomiting  . Ciprofloxacin Nausea Only  . Depakote [Divalproex Sodium] Other (See Comments)    hallucinations    Antimicrobials this admission: 5/19 zosyn >>  5/19 vancomycin >>   Microbiology results: 5/19 BCx: sent  5/19 left lower leg wound: pending (plastic surgery notes indicate that wound cx likely contaminated)   Thank you for allowing pharmacy to be a part of this patient's care.   Lindell [facility], PharmD, BCPS Pager: [phone] 01/26/2017 1:49 PM

## 2017-01-26 NOTE — Plan of Care (Signed)
Problem: Skin Integrity: Goal: Skin integrity will improve Outcome: Progressing Cleanse left leg wound daily with normal saline and fill wound base with 1/4 inch iodoform packing strip. Cover with dry . 4x4 and secure with kerlix  Keep left leg elevated on pillows when in bed

## 2017-01-26 NOTE — Consult Note (Signed)
Reason for Consult: chronic leg wound, admission for cellulitis Referring Physician: Abner Greenspan MD Location: [facility] Date: 5.20.18  Travis Salinas [name] is an 66 y.o. male.  HPI: Patient known to practice, transferred from [facility] ED for admission 5.19.18 for IV antibiotics fo new onset pain cellulitis left leg. Patient with history IVDU and left leg abscess post debridement with Dr. Erlinda Hong 2.2018 and Dr. Marla Roe 3.2018. Chart review indicated tenosynovitis noted of tibialis anterior and EDL tendons. Current IV Vanc/Zosyn.  Last seen 5.18.18 in our clinic and collagen topical ointment management prior to admission. Iodoform packing placed today. Plan films from [facility] negative for osteomyelitis.  Of note on admission UDS positive for cocaine and opiates. Patient himself denies any drug use.  Scheduled for procedure 5.31.18 with Dr. Marla Roe.   Past Medical History:  Diagnosis Date  . Arthritis   . Cervical disc herniation   . COPD (chronic obstructive pulmonary disease) ([place])   . Headache    " due to antibiotics"  . Hepatitis C   . Hypertension     Past Surgical History:  Procedure Laterality Date  . APPLICATION OF A-CELL OF EXTREMITY Left 12/05/2016   Procedure: APPLICATION OF A-CELL OF EXTREMITY;  Surgeon: Loel Lofty Dillingham, DO;  Location: [facility];  Service: Plastics;  Laterality: Left;  . APPLICATION OF WOUND VAC Left 12/05/2016   Procedure: APPLICATION OF WOUND VAC;  Surgeon: Loel Lofty Dillingham, DO;  Location: [facility];  Service: Plastics;  Laterality: Left;  . I&D EXTREMITY Left 11/03/2016   Procedure: IRRIGATION AND DEBRIDEMENT EXTREMITY;  Surgeon: Leandrew Koyanagi, MD;  Location: [facility];  Service: Orthopedics;  Laterality: Left;  . I&D EXTREMITY Left 12/05/2016   Procedure: IRRIGATION AND DEBRIDEMENT EXTREMITY;  Surgeon: Loel Lofty Dillingham, DO;  Location: [facility];  Service: Plastics;  Laterality: Left;  . INCISION AND DRAINAGE ABSCESS Right 11/12/2013   Procedure:  INCISION AND DRAINAGE AND OPEN PACKING OF RIGHT CALF  ABSCESS;  Surgeon: Earnstine Regal, MD;  Location: [facility];  Service: General;  Laterality: Right;  . KNEE SURGERY      Family History  Problem Relation Age of Onset  . Heart disease Mother   . Stroke Mother   . Heart disease Father   . Stroke Father     Social History:  reports that he has quit smoking. His smoking use included Cigarettes. He has a 12.50 pack-year smoking history. He has never used smokeless tobacco. He reports that he uses drugs, including Heroin. He reports that he does not drink alcohol.  Allergies:  Allergies  Allergen Reactions  . Propoxyphene Nausea And Vomiting  . Ciprofloxacin Nausea Only  . Depakote [Divalproex Sodium] Other (See Comments)    hallucinations    Medications: I have reviewed the patient's current medications.  Results for orders placed or performed during the hospital encounter of 01/25/17 (from the past 48 hour(s))  Blood culture (routine x 2)     Status: None (Preliminary result)   Collection Time: 01/25/17  4:41 PM  Result Value Ref Range   Specimen Description BLOOD LEFT ANTECUBITAL    Special Requests IN PEDIATRIC BOTTLE Blood Culture adequate volume    Culture PENDING    Report Status PENDING   CBC with Differential/Platelet     Status: Abnormal   Collection Time: 01/25/17  4:42 PM  Result Value Ref Range   WBC 13.7 (H) 4.0 - 10.5 K/uL   RBC 4.09 (L) 4.22 - 5.81 MIL/uL   Hemoglobin  11.5 (L) 13.0 - 17.0 g/dL   HCT 36.6 (L) 39.0 - 52.0 %   MCV 89.5 78.0 - 100.0 fL   MCH 28.1 26.0 - 34.0 pg   MCHC 31.4 30.0 - 36.0 g/dL   RDW 14.8 11.5 - 15.5 %   Platelets  150 - 400 K/uL    PLATELET CLUMPS NOTED ON SMEAR, COUNT APPEARS ADEQUATE   Neutrophils Relative % 58 %   Lymphocytes Relative 27 %   Monocytes Relative 12 %   Eosinophils Relative 3 %   Basophils Relative 0 %   Neutro Abs 8.0 (H) 1.7 - 7.7 K/uL   Lymphs Abs 3.7 0.7 - 4.0 K/uL   Monocytes Absolute 1.6 (H) 0.1 - 1.0 K/uL    Eosinophils Absolute 0.4 0.0 - 0.7 K/uL   Basophils Absolute 0.0 0.0 - 0.1 K/uL   Smear Review MORPHOLOGY UNREMARKABLE   Basic metabolic panel     Status: Abnormal   Collection Time: 01/25/17  4:42 PM  Result Value Ref Range   Sodium 139 135 - 145 mmol/L   Potassium 4.9 3.5 - 5.1 mmol/L   Chloride 107 101 - 111 mmol/L   CO2 25 22 - 32 mmol/L   Glucose, Bld 89 65 - 99 mg/dL   BUN 17 6 - 20 mg/dL   Creatinine, Ser 1.34 (H) 0.61 - 1.24 mg/dL   Calcium 9.2 8.9 - 10.3 mg/dL   GFR calc non Af Amer 54 (L) >60 mL/min   GFR calc Af Amer >60 >60 mL/min    Comment: (NOTE) The eGFR has been calculated using the CKD EPI equation. This calculation has not been validated in all clinical situations. eGFR's persistently <60 mL/min signify possible Chronic Kidney Disease.    Anion gap 7 5 - 15  Aerobic/Anaerobic Culture (surgical/deep wound)     Status: None (Preliminary result)   Collection Time: 01/25/17  4:55 PM  Result Value Ref Range   Specimen Description WOUND LEFT LOWER LEG    Special Requests NONE    Gram Stain      FEW WBC PRESENT,BOTH PMN AND MONONUCLEAR ABUNDANT GRAM NEGATIVE RODS MODERATE GRAM POSITIVE COCCI IN PAIRS FEW GRAM POSITIVE RODS Performed at [facility], [place] [address], [place], [place] [address]    Culture PENDING    Report Status PENDING   Protime-INR     Status: None   Collection Time: 01/26/17  4:24 AM  Result Value Ref Range   Prothrombin Time 13.5 11.4 - 15.2 seconds   INR 1.03   CBC with Differential/Platelet     Status: Abnormal   Collection Time: 01/26/17  4:24 AM  Result Value Ref Range   WBC 11.7 (H) 4.0 - 10.5 K/uL   RBC 3.97 (L) 4.22 - 5.81 MIL/uL   Hemoglobin 11.0 (L) 13.0 - 17.0 g/dL   HCT 35.8 (L) 39.0 - 52.0 %   MCV 90.2 78.0 - 100.0 fL   MCH 27.7 26.0 - 34.0 pg   MCHC 30.7 30.0 - 36.0 g/dL   RDW 15.0 11.5 - 15.5 %   Platelets 276 150 - 400 K/uL   Neutrophils Relative % 43 %   Neutro Abs 4.9 1.7 - 7.7 K/uL   Lymphocytes Relative  37 %   Lymphs Abs 4.4 (H) 0.7 - 4.0 K/uL   Monocytes Relative 15 %   Monocytes Absolute 1.8 (H) 0.1 - 1.0 K/uL   Eosinophils Relative 5 %   Eosinophils Absolute 0.6 0.0 - 0.7 K/uL  Basophils Relative 0 %   Basophils Absolute 0.1 0.0 - 0.1 K/uL  Comprehensive metabolic panel     Status: Abnormal   Collection Time: 01/26/17  4:24 AM  Result Value Ref Range   Sodium 140 135 - 145 mmol/L   Potassium 3.9 3.5 - 5.1 mmol/L   Chloride 107 101 - 111 mmol/L   CO2 25 22 - 32 mmol/L   Glucose, Bld 111 (H) 65 - 99 mg/dL   BUN 17 6 - 20 mg/dL   Creatinine, Ser 1.20 0.61 - 1.24 mg/dL   Calcium 8.4 (L) 8.9 - 10.3 mg/dL   Total Protein 6.0 (L) 6.5 - 8.1 g/dL   Albumin 3.4 (L) 3.5 - 5.0 g/dL   AST 16 15 - 41 U/L   ALT 10 (L) 17 - 63 U/L   Alkaline Phosphatase 57 38 - 126 U/L   Total Bilirubin 0.3 0.3 - 1.2 mg/dL   GFR calc non Af Amer >60 >60 mL/min   GFR calc Af Amer >60 >60 mL/min    Comment: (NOTE) The eGFR has been calculated using the CKD EPI equation. This calculation has not been validated in all clinical situations. eGFR's persistently <60 mL/min signify possible Chronic Kidney Disease.    Anion gap 8 5 - 15  Urinalysis, Routine w reflex microscopic     Status: None   Collection Time: 01/26/17  8:00 AM  Result Value Ref Range   Color, Urine YELLOW YELLOW   APPearance CLEAR CLEAR   Specific Gravity, Urine 1.017 1.005 - 1.030   pH 5.0 5.0 - 8.0   Glucose, UA NEGATIVE NEGATIVE mg/dL   Hgb urine dipstick NEGATIVE NEGATIVE   Bilirubin Urine NEGATIVE NEGATIVE   Ketones, ur NEGATIVE NEGATIVE mg/dL   Protein, ur NEGATIVE NEGATIVE mg/dL   Nitrite NEGATIVE NEGATIVE   Leukocytes, UA NEGATIVE NEGATIVE  Urine rapid drug screen (hosp performed)     Status: Abnormal   Collection Time: 01/26/17  8:00 AM  Result Value Ref Range   Opiates POSITIVE (A) NONE DETECTED   Cocaine POSITIVE (A) NONE DETECTED   Benzodiazepines NONE DETECTED NONE DETECTED   Amphetamines NONE DETECTED NONE DETECTED    Tetrahydrocannabinol NONE DETECTED NONE DETECTED   Barbiturates NONE DETECTED NONE DETECTED    Comment:        DRUG SCREEN FOR MEDICAL PURPOSES ONLY.  IF CONFIRMATION IS NEEDED FOR ANY PURPOSE, NOTIFY LAB WITHIN 5 DAYS.        LOWEST DETECTABLE LIMITS FOR URINE DRUG SCREEN Drug Class       Cutoff (ng/mL) Amphetamine      1000 Barbiturate      200 Benzodiazepine   784 Tricyclics       696 Opiates          300 Cocaine          300 THC              50     Dg Tibia/fibula Left  Result Date: 01/25/2017 CLINICAL DATA:  Open wound to anterior distal 1/3 of left tibia. Wound x 3 months; has worsened and gotten larger since yest. No fever. EXAM: LEFT TIBIA AND FIBULA - 2 VIEW COMPARISON:  Ankle radiographs, 11/03/2016 FINDINGS: No fracture. No bone lesion. No areas of bone resorption is seen to suggest osteomyelitis. There is nonspecific subcutaneous edema and/or cellulitis extending from the mid leg to the ankle. No soft tissue air. IMPRESSION: 1. No fracture or bone lesion. 2. No evidence of osteomyelitis. 3.  No soft tissue air. Electronically Signed   By: Lajean Manes M.D.   On: 01/25/2017 17:54    ROS Blood pressure (!) 114/55, pulse 65, temperature 97.9 F (36.6 C), temperature source Oral, resp. rate 15, height 6' (1.829 m), weight 70.8 kg (156 lb), SpO2 93 %. Physical Exam Sleeping easily arousable C/o pain leg LLE 1 x 1 x 0.5 cm open wound with slough and small amount necrotic tendon base- this was excised. Undermining circumferential at 12 and 6 o clock up to 1 cm; rubor around wound and mild cellulitis superior to wound Able to dorsiflex ankle and dorsi /plantar flex toes  Assessment/Plan: Bedside debridement completed. Continue iodoform packing. Will let Dr. Marla Roe know of admission. No urgent OR needed. Leg elevation and IV antibiotics- the wound cultures obtained are likely contaminated and can consider switch to Ceftriaxone or Ancef over next day. Ok to shower, no  soaking of left leg.  Positive UDS for cocaine.  Irene Limbo, MD [facility] Plastic & Reconstructive Surgery [phone], pin [phone]

## 2017-01-27 ENCOUNTER — Ambulatory Visit: Payer: Self-pay | Admitting: Plastic Surgery

## 2017-01-27 DIAGNOSIS — S81802A Unspecified open wound, left lower leg, initial encounter: Secondary | ICD-10-CM

## 2017-01-27 LAB — CBC
HCT: 36.5 % — ABNORMAL LOW (ref 39.0–52.0)
HEMOGLOBIN: 11.3 g/dL — AB (ref 13.0–17.0)
MCH: 27.1 pg (ref 26.0–34.0)
MCHC: 31 g/dL (ref 30.0–36.0)
MCV: 87.5 fL (ref 78.0–100.0)
PLATELETS: 251 10*3/uL (ref 150–400)
RBC: 4.17 MIL/uL — ABNORMAL LOW (ref 4.22–5.81)
RDW: 14.3 % (ref 11.5–15.5)
WBC: 11.9 10*3/uL — ABNORMAL HIGH (ref 4.0–10.5)

## 2017-01-27 LAB — BASIC METABOLIC PANEL
Anion gap: 6 (ref 5–15)
BUN: 11 mg/dL (ref 6–20)
CHLORIDE: 106 mmol/L (ref 101–111)
CO2: 26 mmol/L (ref 22–32)
CREATININE: 1.03 mg/dL (ref 0.61–1.24)
Calcium: 9 mg/dL (ref 8.9–10.3)
GFR calc Af Amer: >60 mL/min (ref >60)
GFR calc non Af Amer: >60 mL/min (ref >60)
GLUCOSE: 111 mg/dL — AB (ref 65–99)
Potassium: 3.9 mmol/L (ref 3.5–5.1)
SODIUM: 138 mmol/L (ref 135–145)

## 2017-01-27 MED ORDER — OXYCODONE-ACETAMINOPHEN 5-325 MG PO TABS
1.0000 | ORAL_TABLET | ORAL | Status: DC | PRN
  Administered 2017-01-27: 1 via ORAL
  Administered 2017-01-27 – 2017-01-31 (×17): 2 via ORAL
  Administered 2017-01-31: 1 via ORAL
  Administered 2017-01-31: 2 via ORAL
  Filled 2017-01-27 (×21): qty 2

## 2017-01-27 MED ORDER — ALBUTEROL SULFATE (2.5 MG/3ML) 0.083% IN NEBU
2.5000 mg | INHALATION_SOLUTION | Freq: Four times a day (QID) | RESPIRATORY_TRACT | Status: DC | PRN
  Administered 2017-01-27 – 2017-01-28 (×2): 2.5 mg via RESPIRATORY_TRACT
  Filled 2017-01-27 (×2): qty 3

## 2017-01-27 MED ORDER — MORPHINE SULFATE (PF) 4 MG/ML IV SOLN
2.0000 mg | INTRAVENOUS | Status: DC | PRN
  Administered 2017-01-29 – 2017-01-30 (×4): 2 mg via INTRAVENOUS
  Filled 2017-01-27: qty 0.5
  Filled 2017-01-27: qty 1
  Filled 2017-01-27: qty 0.5
  Filled 2017-01-27 (×2): qty 1
  Filled 2017-01-27: qty 0.5
  Filled 2017-01-27 (×2): qty 1

## 2017-01-27 NOTE — Progress Notes (Signed)
PROGRESS NOTE    Travis Salinas  HUD:[id number] DOB: March 16, 1951 DOA: 01/25/2017 PCP: Kathyrn Lass, MD   Chief Complaint  Patient presents with  . Leg Pain    Brief Narrative:  HPI on 01/25/2017 by Dr. Rosealee Albee Travis Salinas is a 66 y.o. gentleman with a history of HTN, hepatitis C (treated), COPD, and IV drug use (he says that he is clean now) who has had a chronic wound just above his left ankle since February.  Initial injury was related to IV drug injection.  He has had multiple surgeries since February, most recent with Dr. Marla Roe on 3/29.  He wore a wound vac for about one month after that, and they have been trying other wound management modalities to move him closer to complete closure.  He is actually scheduled for surgery on 5/31.  The patient reports acute spontaneous skin changes today.  He denies any recurrent injury or triggers.  He reports increased drainage, increase pain, increased odor, increased redness.  No fever, chills, or sweats.  No nausea or vomiting. Assessment & Plan   Left lower leg extremity abscess/chronic slow healing wound/cellulitis -Patient has been following up with Dr. Marla Roe, plastic surgery, as an outpatient -Plastics consulted and appreciated -Patient presented to the emergency department with complaints of swelling and increased pain which started on Saturday. Patient recently saw Dr. Marla Roe on 01/24/2017. -Continue IV vancomycin and Zosyn -Dr. Iran Planas preformed bedside debridement with iodoform packing and recommended continuing antibiotics as well as leg elevation. Wound cultures that were obtained or likely contaminated, recommended possibly switching patient to ceftriaxone or Ancef over the next day. -Blood cultures pending -Wound culture shows GNR, GPC, GPR -Discussed with Dr. Marla Roe, plan is for surgery on 01/29/2017 -Leukocytosis improving   Essential hypertension -Continue amlodipine, atenolol  BPH -Continue  Flomax  Mood disorder, anxiety and depression -Continue Prozac, Lamictal, Abilify, Klonopin as needed  Drug abuse -Tox screen positive for cocaine and opiates  DVT Prophylaxis  Lovenox  Code Status: Full  Family Communication: Wife at bedside.  Disposition Plan: Admitted, pending surgery on 01/29/2017  Consultants Plastic surgery  Procedures  Bedside debridement  Antibiotics   Anti-infectives    Start     Dose/Rate Route Frequency Ordered Stop   01/26/17 1600  vancomycin (VANCOCIN) IVPB 1000 mg/200 mL premix     1,000 mg 200 mL/hr over 60 Minutes Intravenous Every 12 hours 01/26/17 1345     01/26/17 0800  vancomycin (VANCOCIN) 500 mg in sodium chloride 0.9 % 100 mL IVPB  Status:  Discontinued     500 mg 100 mL/hr over 60 Minutes Intravenous Every 12 hours 01/26/17 0102 01/26/17 1345   01/25/17 2359  piperacillin-tazobactam (ZOSYN) IVPB 3.375 g     3.375 g 12.5 mL/hr over 240 Minutes Intravenous Every 8 hours 01/25/17 2312     01/25/17 1615  vancomycin (VANCOCIN) IVPB 1000 mg/200 mL premix     1,000 mg 200 mL/hr over 60 Minutes Intravenous  Once 01/25/17 1604 01/25/17 1856   01/25/17 1615  piperacillin-tazobactam (ZOSYN) IVPB 3.375 g     3.375 g 100 mL/hr over 30 Minutes Intravenous  Once 01/25/17 1604 01/25/17 1754      Subjective:   [name] seen and examined today.  He continues to complain of left leg pain and swelling. Denies any current chest pain, shortness breath, abdominal pain, nausea or vomiting, diarrhea, constipation.   Objective:   Vitals:   01/26/17 1326 01/26/17 2057 01/27/17 8588  01/27/17 0917  BP: 140/67 (!) 143/72 131/68 135/66  Pulse: 71 72 65 75  Resp: 20 19 18    Temp: 98.1 F (36.7 C) 97.8 F (36.6 C) 98 F (36.7 C)   TempSrc: Oral Oral Oral   SpO2: 95% 97% 98%   Weight:      Height:        Intake/Output Summary (Last 24 hours) at 01/27/17 1213 Last data filed at 01/27/17 1000  Gross per 24 hour  Intake           2890.2 ml   Output             3225 ml  Net           -334.8 ml   Filed Weights   01/25/17 2300  Weight: 70.8 kg (156 lb)   Exam  General: Well developed, well nourished, NAD, appears stated age  HEENT: NCAT, mucous membranes moist.   Cardiovascular: S1 S2 auscultated, no rubs, murmurs or gallops. Regular rate and rhythm.  Respiratory: Clear to auscultation bilaterally  Abdomen: Soft, nontender, nondistended, + bowel sounds  Extremities: warm dry without cyanosis clubbing or edema of the RLE. LLE mild edema, dressing in place  Neuro: AAOx3, nonfocal  Psych: Anxious   Data Reviewed: I have personally reviewed following labs and imaging studies  CBC:  Recent Labs Lab 01/25/17 1642 01/26/17 0424 01/27/17 0422  WBC 13.7* 11.7* 11.9*  NEUTROABS 8.0* 4.9  --   HGB 11.5* 11.0* 11.3*  HCT 36.6* 35.8* 36.5*  MCV 89.5 90.2 87.5  PLT PLATELET CLUMPS NOTED ON SMEAR, COUNT APPEARS ADEQUATE 276 443   Basic Metabolic Panel:  Recent Labs Lab 01/25/17 1642 01/26/17 0424 01/27/17 0422  NA 139 140 138  K 4.9 3.9 3.9  CL 107 107 106  CO2 25 25 26   GLUCOSE 89 111* 111*  BUN 17 17 11   CREATININE 1.34* 1.20 1.03  CALCIUM 9.2 8.4* 9.0   GFR: Estimated Creatinine Clearance: 72.6 mL/min (by C-G formula based on SCr of 1.03 mg/dL). Liver Function Tests:  Recent Labs Lab 01/26/17 0424  AST 16  ALT 10*  ALKPHOS 57  BILITOT 0.3  PROT 6.0*  ALBUMIN 3.4*   No results for input(s): LIPASE, AMYLASE in the last 168 hours. No results for input(s): AMMONIA in the last 168 hours. Coagulation Profile:  Recent Labs Lab 01/26/17 0424  INR 1.03   Cardiac Enzymes: No results for input(s): CKTOTAL, CKMB, CKMBINDEX, TROPONINI in the last 168 hours. BNP (last 3 results) No results for input(s): PROBNP in the last 8760 hours. HbA1C: No results for input(s): HGBA1C in the last 72 hours. CBG: No results for input(s): GLUCAP in the last 168 hours. Lipid Profile: No results for input(s):  CHOL, HDL, LDLCALC, TRIG, CHOLHDL, LDLDIRECT in the last 72 hours. Thyroid Function Tests: No results for input(s): TSH, T4TOTAL, FREET4, T3FREE, THYROIDAB in the last 72 hours. Anemia Panel: No results for input(s): VITAMINB12, FOLATE, FERRITIN, TIBC, IRON, RETICCTPCT in the last 72 hours. Urine analysis:    Component Value Date/Time   COLORURINE YELLOW 01/26/2017 0800   APPEARANCEUR CLEAR 01/26/2017 0800   LABSPEC 1.017 01/26/2017 0800   PHURINE 5.0 01/26/2017 0800   GLUCOSEU NEGATIVE 01/26/2017 0800   HGBUR NEGATIVE 01/26/2017 0800   BILIRUBINUR NEGATIVE 01/26/2017 0800   KETONESUR NEGATIVE 01/26/2017 0800   PROTEINUR NEGATIVE 01/26/2017 0800   UROBILINOGEN 1.0 11/14/2013 1424   NITRITE NEGATIVE 01/26/2017 0800   LEUKOCYTESUR NEGATIVE 01/26/2017 0800   Sepsis Labs: @  LABRCNTIP(procalcitonin:4,lacticidven:4)  ) Recent Results (from the past 240 hour(s))  Blood culture (routine x 2)     Status: None (Preliminary result)   Collection Time: 01/25/17  4:32 PM  Result Value Ref Range Status   Specimen Description BLOOD RIGHT UPPER ARM  Final   Special Requests   Final    BOTTLES DRAWN AEROBIC AND ANAEROBIC Blood Culture adequate volume   Culture   Final    NO GROWTH 1 DAY Performed at [facility], [address] [place], [place] [address]    Report Status PENDING  Incomplete  Blood culture (routine x 2)     Status: None (Preliminary result)   Collection Time: 01/25/17  4:41 PM  Result Value Ref Range Status   Specimen Description BLOOD LEFT ANTECUBITAL  Final   Special Requests IN PEDIATRIC BOTTLE Blood Culture adequate volume  Final   Culture PENDING  Incomplete   Report Status PENDING  Incomplete  Aerobic/Anaerobic Culture (surgical/deep wound)     Status: None (Preliminary result)   Collection Time: 01/25/17  4:55 PM  Result Value Ref Range Status   Specimen Description WOUND LEFT LOWER LEG  Final   Special Requests NONE  Final   Gram Stain   Final    FEW WBC  PRESENT,BOTH PMN AND MONONUCLEAR ABUNDANT GRAM NEGATIVE RODS MODERATE GRAM POSITIVE COCCI IN PAIRS FEW GRAM POSITIVE RODS    Culture   Final    TOO YOUNG TO READ Performed at [facility], [place] [address] [place], [place] [address]    Report Status PENDING  Incomplete      Radiology Studies: Dg Tibia/fibula Left  Result Date: 01/25/2017 CLINICAL DATA:  Open wound to anterior distal 1/3 of left tibia. Wound x 3 months; has worsened and gotten larger since yest. No fever. EXAM: LEFT TIBIA AND FIBULA - 2 VIEW COMPARISON:  Ankle radiographs, 11/03/2016 FINDINGS: No fracture. No bone lesion. No areas of bone resorption is seen to suggest osteomyelitis. There is nonspecific subcutaneous edema and/or cellulitis extending from the mid leg to the ankle. No soft tissue air. IMPRESSION: 1. No fracture or bone lesion. 2. No evidence of osteomyelitis. 3. No soft tissue air. Electronically Signed   By: Lajean Manes M.D.   On: 01/25/2017 17:54     Scheduled Meds: . amLODipine  10 mg Oral q morning - 10a  . ARIPiprazole  15 mg Oral Daily  . aspirin EC  81 mg Oral Daily  . atenolol  25 mg Oral Daily  . enoxaparin (LOVENOX) injection  40 mg Subcutaneous QHS  . FLUoxetine  20 mg Oral Daily  . lamoTRIgine  200 mg Oral q morning - 10a  . tamsulosin  0.4 mg Oral QPC supper   Continuous Infusions: . sodium chloride 100 mL/hr at 01/27/17 0435  . piperacillin-tazobactam (ZOSYN)  IV 3.375 g (01/27/17 0800)  . vancomycin Stopped (01/27/17 0441)     LOS: 2 days   Time Spent in minutes   30 minutes  Consandra Laske D.O. on 01/27/2017 at 12:13 PM  Between 7am to 7pm - Pager - [phone]  After 7pm go to [email or web address] - password [email or web address]  And look for the night coverage person covering for me after hours  [facility] Office  [phone]

## 2017-01-27 NOTE — Progress Notes (Signed)
Subjective/Chief Complaint: Patient reports less redness over the area today.  Wbc improved. No fever. He is asking for something stronger for pain.  Patient anxious to have something done to the area in the OR.  Wife at beside.   Objective: Vital signs in last 24 hours: Temp:  [97.8 F (36.6 C)-98.1 F (36.7 C)] 98 F (36.7 C) (05/21 0443) Pulse Rate:  [65-75] 75 (05/21 0917) Resp:  [18-20] 18 (05/21 0443) BP: (131-143)/(66-72) 135/66 (05/21 0917) SpO2:  [95 %-98 %] 98 % (05/21 0443) Last BM Date: 01/25/17  Intake/Output from previous day: 05/20 0701 - 05/21 0700 In: 2890.2 [P.O.:750; I.V.:1490.2; IV Piggyback:650] Out: 3075 [Urine:3075] Intake/Output this shift: Total I/O In: 360 [P.O.:360] Out: 300 [Urine:300]  General appearance: alert, cooperative and anxious The left anterior lower leg wound is open with visible tendon and peri wound erythema/cellulitis. Minimal tan drainage on packing.   Lab Results:   Recent Labs  01/26/17 0424 01/27/17 0422  WBC 11.7* 11.9*  HGB 11.0* 11.3*  HCT 35.8* 36.5*  PLT 276 251   BMET  Recent Labs  01/26/17 0424 01/27/17 0422  NA 140 138  K 3.9 3.9  CL 107 106  CO2 25 26  GLUCOSE 111* 111*  BUN 17 11  CREATININE 1.20 1.03  CALCIUM 8.4* 9.0   PT/INR  Recent Labs  01/26/17 0424  LABPROT 13.5  INR 1.03   ABG No results for input(s): PHART, HCO3 in the last 72 hours.  Invalid input(s): PCO2, PO2  Studies/Results: Dg Tibia/fibula Left  Result Date: 01/25/2017 CLINICAL DATA:  Open wound to anterior distal 1/3 of left tibia. Wound x 3 months; has worsened and gotten larger since yest. No fever. EXAM: LEFT TIBIA AND FIBULA - 2 VIEW COMPARISON:  Ankle radiographs, 11/03/2016 FINDINGS: No fracture. No bone lesion. No areas of bone resorption is seen to suggest osteomyelitis. There is nonspecific subcutaneous edema and/or cellulitis extending from the mid leg to the ankle. No soft tissue air. IMPRESSION: 1. No  fracture or bone lesion. 2. No evidence of osteomyelitis. 3. No soft tissue air. Electronically Signed   By: Lajean Manes M.D.   On: 01/25/2017 17:54    Anti-infectives: Anti-infectives    Start     Dose/Rate Route Frequency Ordered Stop   01/26/17 1600  vancomycin (VANCOCIN) IVPB 1000 mg/200 mL premix     1,000 mg 200 mL/hr over 60 Minutes Intravenous Every 12 hours 01/26/17 1345     01/26/17 0800  vancomycin (VANCOCIN) 500 mg in sodium chloride 0.9 % 100 mL IVPB  Status:  Discontinued     500 mg 100 mL/hr over 60 Minutes Intravenous Every 12 hours 01/26/17 0102 01/26/17 1345   01/25/17 2359  piperacillin-tazobactam (ZOSYN) IVPB 3.375 g     3.375 g 12.5 mL/hr over 240 Minutes Intravenous Every 8 hours 01/25/17 2312     01/25/17 1615  vancomycin (VANCOCIN) IVPB 1000 mg/200 mL premix     1,000 mg 200 mL/hr over 60 Minutes Intravenous  Once 01/25/17 1604 01/25/17 1856   01/25/17 1615  piperacillin-tazobactam (ZOSYN) IVPB 3.375 g     3.375 g 100 mL/hr over 30 Minutes Intravenous  Once 01/25/17 1604 01/25/17 1754      Assessment/Plan: s/p * No surgery found * Plan for OR Wednesday 01/29/17 @ 7:30 am .with Dr. Marla Roe.  Patient and wife agreeable to proceed.  Counseled patient that we would not know the extent of the damage to the tendon and surrounding tissues until exploration  in the OR. Counseled he will likely need the [facility] again and will ask Case Management to start home VAC process again.   LOS: 2 days   Bellatrix Devonshire,PA-C Plastic Surgery [phone]

## 2017-01-28 DIAGNOSIS — N189 Chronic kidney disease, unspecified: Secondary | ICD-10-CM

## 2017-01-28 LAB — CREATININE, SERUM
CREATININE: 1.28 mg/dL — AB (ref 0.61–1.24)
GFR calc Af Amer: >60 mL/min (ref >60)
GFR, EST NON AFRICAN AMERICAN: 58 mL/min — AB (ref >60)

## 2017-01-28 MED ORDER — CEFAZOLIN SODIUM-DEXTROSE 1-4 GM/50ML-% IV SOLN
1.0000 g | Freq: Three times a day (TID) | INTRAVENOUS | Status: DC
  Administered 2017-01-28: 1 g via INTRAVENOUS
  Administered 2017-01-29: 2 g via INTRAVENOUS
  Administered 2017-01-29 – 2017-01-31 (×8): 1 g via INTRAVENOUS
  Filled 2017-01-28 (×10): qty 50

## 2017-01-28 MED ORDER — CHLORHEXIDINE GLUCONATE CLOTH 2 % EX PADS
6.0000 | MEDICATED_PAD | Freq: Once | CUTANEOUS | Status: AC
  Administered 2017-01-29: 6 via TOPICAL

## 2017-01-28 MED ORDER — CHLORHEXIDINE GLUCONATE CLOTH 2 % EX PADS
6.0000 | MEDICATED_PAD | Freq: Once | CUTANEOUS | Status: AC
  Administered 2017-01-28: 6 via TOPICAL

## 2017-01-28 MED ORDER — ALBUTEROL SULFATE (2.5 MG/3ML) 0.083% IN NEBU
2.5000 mg | INHALATION_SOLUTION | Freq: Two times a day (BID) | RESPIRATORY_TRACT | Status: DC
  Administered 2017-01-28 – 2017-01-31 (×5): 2.5 mg via RESPIRATORY_TRACT
  Filled 2017-01-28 (×5): qty 3

## 2017-01-28 NOTE — Progress Notes (Signed)
I evaluated the patient at bedside.  I agree with Dr. Marla Roe that he needs I&D of the wound.  I agree the extensor tendon is likely nonviable and should be debrided as well. He continues to be in denial of his drug use.  I've discussed the risks of cocaine use on his potential to heal his wound.  Appreciate Dr. Eusebio Friendly assistance with this complex wound.    Azucena Cecil, MD [place] 7:56 AM

## 2017-01-28 NOTE — Progress Notes (Signed)
Pt does not want to sign permit until speaks with wife and lets her read permit.permit. Instructed permit needs to be signed in front of RN when he is ready. Unsigned permit left in room with patient.

## 2017-01-28 NOTE — Progress Notes (Signed)
PROGRESS NOTE    Travis Salinas  UUV:[id number] DOB: 11/09/1950 DOA: 01/25/2017 PCP: Kathyrn Lass, MD   Chief Complaint  Patient presents with  . Leg Pain    Brief Narrative:  HPI on 01/25/2017 by Dr. Rosealee Albee Travis Salinas is a 66 y.o. gentleman with a history of HTN, hepatitis C (treated), COPD, and IV drug use (he says that he is clean now) who has had a chronic wound just above his left ankle since February.  Initial injury was related to IV drug injection.  He has had multiple surgeries since February, most recent with Dr. Marla Roe on 3/29.  He wore a wound vac for about one month after that, and they have been trying other wound management modalities to move him closer to complete closure.  He is actually scheduled for surgery on 5/31.  The patient reports acute spontaneous skin changes today.  He denies any recurrent injury or triggers.  He reports increased drainage, increase pain, increased odor, increased redness.  No fever, chills, or sweats.  No nausea or vomiting. Assessment & Plan   Left lower leg extremity abscess/chronic slow healing wound/cellulitis -Patient has been following up with Dr. Marla Roe, plastic surgery, as an outpatient -Plastics consulted and appreciated -Patient presented to the emergency department with complaints of swelling and increased pain which started on Saturday. Patient recently saw Dr. Marla Roe on 01/24/2017. -Initially placed on  IV vancomycin and Zosyn, will transition to Ancef today. -Dr. Iran Planas preformed bedside debridement with iodoform packing and recommended continuing antibiotics as well as leg elevation. Wound cultures that were obtained or likely contaminated, recommended possibly switching patient to ceftriaxone or Ancef over the next day. -Blood cultures pending -Wound culture shows GNR, GPC, GPR -Discussed with Dr. Marla Roe, plan is for surgery on 01/29/2017 -Leukocytosis improving   Essential  hypertension -Continue amlodipine, atenolol  BPH -Continue Flomax  Mood disorder, anxiety and depression -Continue Prozac, Lamictal, Abilify, Klonopin as needed  Drug abuse -Tox screen positive for cocaine and opiates -Supposedly initial injury of LLE due to heroin injection.  Chronic kidney disease, stage II-III -creatinine mildly elevated, 1.28 (baseline creatinine 1.1) -Likely secondary to vancomycin and zosyn -Have transitioned to Ancef -Continue to monitor BMP  DVT Prophylaxis  Lovenox  Code Status: Full  Family Communication: Wife at bedside.  Disposition Plan: Admitted, pending surgery on 01/29/2017  Consultants Plastic surgery  Procedures  Bedside debridement  Antibiotics   Anti-infectives    Start     Dose/Rate Route Frequency Ordered Stop   01/28/17 1600  ceFAZolin (ANCEF) IVPB 1 g/50 mL premix     1 g 100 mL/hr over 30 Minutes Intravenous Every 8 hours 01/28/17 0922     01/26/17 1600  vancomycin (VANCOCIN) IVPB 1000 mg/200 mL premix  Status:  Discontinued     1,000 mg 200 mL/hr over 60 Minutes Intravenous Every 12 hours 01/26/17 1345 01/28/17 0922   01/26/17 0800  vancomycin (VANCOCIN) 500 mg in sodium chloride 0.9 % 100 mL IVPB  Status:  Discontinued     500 mg 100 mL/hr over 60 Minutes Intravenous Every 12 hours 01/26/17 0102 01/26/17 1345   01/25/17 2359  piperacillin-tazobactam (ZOSYN) IVPB 3.375 g  Status:  Discontinued     3.375 g 12.5 mL/hr over 240 Minutes Intravenous Every 8 hours 01/25/17 2312 01/28/17 0922   01/25/17 1615  vancomycin (VANCOCIN) IVPB 1000 mg/200 mL premix     1,000 mg 200 mL/hr over 60 Minutes Intravenous  Once 01/25/17 1604 01/25/17  1856   01/25/17 1615  piperacillin-tazobactam (ZOSYN) IVPB 3.375 g     3.375 g 100 mL/hr over 30 Minutes Intravenous  Once 01/25/17 1604 01/25/17 1754      Subjective:   [name] seen and examined today.  Continues to complain of left leg pain and swelling. Denies chest pain,  shortness of breath, abdominal pin, nausea, vomiting.    Objective:   Vitals:   01/27/17 2102 01/28/17 0457 01/28/17 0855 01/28/17 1155  BP: 137/73 129/68 138/83   Pulse: 73 61 71   Resp: 18 18 18    Temp: 98.3 F (36.8 C) 98.2 F (36.8 C) 98.4 F (36.9 C)   TempSrc: Oral Oral Oral   SpO2: 96% 97% 97% 95%  Weight:      Height:        Intake/Output Summary (Last 24 hours) at 01/28/17 1204 Last data filed at 01/28/17 1035  Gross per 24 hour  Intake             1930 ml  Output             4225 ml  Net            -2295 ml   Filed Weights   01/25/17 2300  Weight: 70.8 kg (156 lb)   Exam  General: Well developed, well nourished, NAD, appears stated age  HEENT: NCAT,  mucous membranes moist.   Cardiovascular: S1 S2 auscultated, no rubs, murmurs or gallops. Regular rate and rhythm.  Respiratory: Clear to auscultation bilaterally with equal chest rise  Abdomen: Soft, nontender, nondistended, + bowel sounds  Extremities: warm dry without cyanosis clubbing or edema or RLE. LLE mild edema and erythema, dressing in place  Neuro: AAOx3, nonfocal  Psych: Anxious but appropriate  Data Reviewed: I have personally reviewed following labs and imaging studies  CBC:  Recent Labs Lab 01/25/17 1642 01/26/17 0424 01/27/17 0422  WBC 13.7* 11.7* 11.9*  NEUTROABS 8.0* 4.9  --   HGB 11.5* 11.0* 11.3*  HCT 36.6* 35.8* 36.5*  MCV 89.5 90.2 87.5  PLT PLATELET CLUMPS NOTED ON SMEAR, COUNT APPEARS ADEQUATE 276 174   Basic Metabolic Panel:  Recent Labs Lab 01/25/17 1642 01/26/17 0424 01/27/17 0422 01/28/17 0407  NA 139 140 138  --   K 4.9 3.9 3.9  --   CL 107 107 106  --   CO2 25 25 26   --   GLUCOSE 89 111* 111*  --   BUN 17 17 11   --   CREATININE 1.34* 1.20 1.03 1.28*  CALCIUM 9.2 8.4* 9.0  --    GFR: Estimated Creatinine Clearance: 58.4 mL/min (A) (by C-G formula based on SCr of 1.28 mg/dL (H)). Liver Function Tests:  Recent Labs Lab 01/26/17 0424  AST 16  ALT  10*  ALKPHOS 57  BILITOT 0.3  PROT 6.0*  ALBUMIN 3.4*   No results for input(s): LIPASE, AMYLASE in the last 168 hours. No results for input(s): AMMONIA in the last 168 hours. Coagulation Profile:  Recent Labs Lab 01/26/17 0424  INR 1.03   Cardiac Enzymes: No results for input(s): CKTOTAL, CKMB, CKMBINDEX, TROPONINI in the last 168 hours. BNP (last 3 results) No results for input(s): PROBNP in the last 8760 hours. HbA1C: No results for input(s): HGBA1C in the last 72 hours. CBG: No results for input(s): GLUCAP in the last 168 hours. Lipid Profile: No results for input(s): CHOL, HDL, LDLCALC, TRIG, CHOLHDL, LDLDIRECT in the last 72 hours. Thyroid Function Tests: No  results for input(s): TSH, T4TOTAL, FREET4, T3FREE, THYROIDAB in the last 72 hours. Anemia Panel: No results for input(s): VITAMINB12, FOLATE, FERRITIN, TIBC, IRON, RETICCTPCT in the last 72 hours. Urine analysis:    Component Value Date/Time   COLORURINE YELLOW 01/26/2017 0800   APPEARANCEUR CLEAR 01/26/2017 0800   LABSPEC 1.017 01/26/2017 0800   PHURINE 5.0 01/26/2017 0800   GLUCOSEU NEGATIVE 01/26/2017 0800   HGBUR NEGATIVE 01/26/2017 0800   BILIRUBINUR NEGATIVE 01/26/2017 0800   KETONESUR NEGATIVE 01/26/2017 0800   PROTEINUR NEGATIVE 01/26/2017 0800   UROBILINOGEN 1.0 11/14/2013 1424   NITRITE NEGATIVE 01/26/2017 0800   LEUKOCYTESUR NEGATIVE 01/26/2017 0800   Sepsis Labs: @LABRCNTIP (procalcitonin:4,lacticidven:4)  ) Recent Results (from the past 240 hour(s))  Blood culture (routine x 2)     Status: None (Preliminary result)   Collection Time: 01/25/17  4:32 PM  Result Value Ref Range Status   Specimen Description BLOOD RIGHT UPPER ARM  Final   Special Requests   Final    BOTTLES DRAWN AEROBIC AND ANAEROBIC Blood Culture adequate volume   Culture   Final    NO GROWTH 1 DAY Performed at [facility], [place] [address], [place], [place] [address]    Report Status PENDING  Incomplete  Blood  culture (routine x 2)     Status: None (Preliminary result)   Collection Time: 01/25/17  4:41 PM  Result Value Ref Range Status   Specimen Description BLOOD LEFT ANTECUBITAL  Final   Special Requests IN PEDIATRIC BOTTLE Blood Culture adequate volume  Final   Culture PENDING  Incomplete   Report Status PENDING  Incomplete  Aerobic/Anaerobic Culture (surgical/deep wound)     Status: None (Preliminary result)   Collection Time: 01/25/17  4:55 PM  Result Value Ref Range Status   Specimen Description WOUND LEFT LOWER LEG  Final   Special Requests NONE  Final   Gram Stain   Final    FEW WBC PRESENT,BOTH PMN AND MONONUCLEAR ABUNDANT GRAM NEGATIVE RODS MODERATE GRAM POSITIVE COCCI IN PAIRS FEW GRAM POSITIVE RODS    Culture   Final    CULTURE REINCUBATED FOR BETTER GROWTH Performed at [facility], [place] [address] [place],  [address]    Report Status PENDING  Incomplete      Radiology Studies: No results found.   Scheduled Meds: . albuterol  2.5 mg Nebulization BID  . amLODipine  10 mg Oral q morning - 10a  . ARIPiprazole  15 mg Oral Daily  . aspirin EC  81 mg Oral Daily  . atenolol  25 mg Oral Daily  . enoxaparin (LOVENOX) injection  40 mg Subcutaneous QHS  . FLUoxetine  20 mg Oral Daily  . lamoTRIgine  200 mg Oral q morning - 10a  . tamsulosin  0.4 mg Oral QPC supper   Continuous Infusions: .  ceFAZolin (ANCEF) IV       LOS: 3 days   Time Spent in minutes   30 minutes  Donnivan Villena D.O. on 01/28/2017 at 12:04 PM  Between 7am to 7pm - Pager - [phone]  After 7pm go to [email or web address] - password [email or web address]  And look for the night coverage person covering for me after hours  [facility] Office  [phone]

## 2017-01-28 NOTE — Consult Note (Signed)
   [facility] CM Inpatient Consult   01/28/2017  [place] [birth year] [phone]   Came to visit Mr. Amol Domanski on behalf of Link to [facility] Care Management program for [employer] employees/dependents with [facility] insurance.  Mr. Delando Satter states he is not sure what the plan will be at this time. Will continue to follow along. He currently denies any Link to Wellness program needs at this time. Provided [employer], contact information, and 24-hr nurse line magnet. Confirmed best contact number as [phone] for post discharge call. Expressed appreciation of visit.   [name], MSN-Ed, RN,BSN [facility] Liaison [phone]

## 2017-01-29 ENCOUNTER — Ambulatory Visit (HOSPITAL_COMMUNITY): Admission: RE | Admit: 2017-01-29 | Payer: 59 | Source: Ambulatory Visit | Admitting: Plastic Surgery

## 2017-01-29 ENCOUNTER — Inpatient Hospital Stay (HOSPITAL_COMMUNITY): Payer: 59 | Admitting: Anesthesiology

## 2017-01-29 ENCOUNTER — Encounter (HOSPITAL_COMMUNITY)
Admission: EM | Disposition: A | Discharge status: Home / Self Care | Payer: Self-pay | Source: Home / Self Care | Attending: Internal Medicine

## 2017-01-29 ENCOUNTER — Encounter (HOSPITAL_COMMUNITY): Payer: Self-pay | Admitting: Anesthesiology

## 2017-01-29 DIAGNOSIS — S81802A Unspecified open wound, left lower leg, initial encounter: Secondary | ICD-10-CM

## 2017-01-29 HISTORY — PX: INCISION AND DRAINAGE OF WOUND: SHX1803

## 2017-01-29 LAB — BASIC METABOLIC PANEL
ANION GAP: 10 (ref 5–15)
BUN: 12 mg/dL (ref 6–20)
CO2: 25 mmol/L (ref 22–32)
Calcium: 9.3 mg/dL (ref 8.9–10.3)
Chloride: 104 mmol/L (ref 101–111)
Creatinine, Ser: 1.1 mg/dL (ref 0.61–1.24)
GFR calc Af Amer: >60 mL/min (ref >60)
Glucose, Bld: 101 mg/dL — ABNORMAL HIGH (ref 65–99)
POTASSIUM: 4.4 mmol/L (ref 3.5–5.1)
SODIUM: 139 mmol/L (ref 135–145)

## 2017-01-29 LAB — CBC
HCT: 37.1 % — ABNORMAL LOW (ref 39.0–52.0)
Hemoglobin: 11.8 g/dL — ABNORMAL LOW (ref 13.0–17.0)
MCH: 28 pg (ref 26.0–34.0)
MCHC: 31.8 g/dL (ref 30.0–36.0)
MCV: 87.9 fL (ref 78.0–100.0)
PLATELETS: 296 10*3/uL (ref 150–400)
RBC: 4.22 MIL/uL (ref 4.22–5.81)
RDW: 14.3 % (ref 11.5–15.5)
WBC: 10.6 10*3/uL — AB (ref 4.0–10.5)

## 2017-01-29 LAB — SURGICAL PCR SCREEN
MRSA, PCR: NEGATIVE
Staphylococcus aureus: NEGATIVE

## 2017-01-29 SURGERY — IRRIGATION AND DEBRIDEMENT WOUND
Anesthesia: General | Site: Leg Lower | Laterality: Left

## 2017-01-29 MED ORDER — MIDAZOLAM HCL 5 MG/5ML IJ SOLN
INTRAMUSCULAR | Status: DC | PRN
  Administered 2017-01-29: 2 mg via INTRAVENOUS

## 2017-01-29 MED ORDER — SODIUM CHLORIDE 0.9 % IV SOLN
INTRAVENOUS | Status: DC | PRN
  Administered 2017-01-29: 07:00:00 via INTRAVENOUS

## 2017-01-29 MED ORDER — BUPIVACAINE HCL (PF) 0.25 % IJ SOLN
INTRAMUSCULAR | Status: AC
  Filled 2017-01-29: qty 30

## 2017-01-29 MED ORDER — FENTANYL CITRATE (PF) 100 MCG/2ML IJ SOLN
INTRAMUSCULAR | Status: AC
  Filled 2017-01-29: qty 2

## 2017-01-29 MED ORDER — 0.9 % SODIUM CHLORIDE (POUR BTL) OPTIME
TOPICAL | Status: DC | PRN
  Administered 2017-01-29: 1000 mL

## 2017-01-29 MED ORDER — PHENYLEPHRINE 40 MCG/ML (10ML) SYRINGE FOR IV PUSH (FOR BLOOD PRESSURE SUPPORT)
PREFILLED_SYRINGE | INTRAVENOUS | Status: DC | PRN
  Administered 2017-01-29 (×6): 80 ug via INTRAVENOUS

## 2017-01-29 MED ORDER — FENTANYL CITRATE (PF) 100 MCG/2ML IJ SOLN
25.0000 ug | INTRAMUSCULAR | Status: DC | PRN
  Administered 2017-01-29: 25 ug via INTRAVENOUS
  Administered 2017-01-29: 50 ug via INTRAVENOUS

## 2017-01-29 MED ORDER — PROPOFOL 10 MG/ML IV BOLUS
INTRAVENOUS | Status: DC | PRN
  Administered 2017-01-29: 170 mg via INTRAVENOUS

## 2017-01-29 MED ORDER — DEXAMETHASONE SODIUM PHOSPHATE 10 MG/ML IJ SOLN
INTRAMUSCULAR | Status: DC | PRN
  Administered 2017-01-29: 10 mg via INTRAVENOUS

## 2017-01-29 MED ORDER — SODIUM CHLORIDE 0.9 % IR SOLN
Status: AC
  Filled 2017-01-29: qty 500000

## 2017-01-29 MED ORDER — SUCCINYLCHOLINE CHLORIDE 200 MG/10ML IV SOSY
PREFILLED_SYRINGE | INTRAVENOUS | Status: DC | PRN
  Administered 2017-01-29: 100 mg via INTRAVENOUS

## 2017-01-29 MED ORDER — FENTANYL CITRATE (PF) 100 MCG/2ML IJ SOLN
INTRAMUSCULAR | Status: DC | PRN
  Administered 2017-01-29: 100 ug via INTRAVENOUS
  Administered 2017-01-29: 50 ug via INTRAVENOUS
  Administered 2017-01-29: 100 ug via INTRAVENOUS

## 2017-01-29 MED ORDER — GLYCOPYRROLATE 0.2 MG/ML IV SOSY
PREFILLED_SYRINGE | INTRAVENOUS | Status: DC | PRN
  Administered 2017-01-29: .2 mg via INTRAVENOUS

## 2017-01-29 MED ORDER — LIDOCAINE 2% (20 MG/ML) 5 ML SYRINGE
INTRAMUSCULAR | Status: DC | PRN
  Administered 2017-01-29: 80 mg via INTRAVENOUS

## 2017-01-29 MED ORDER — LACTATED RINGERS IV SOLN
INTRAVENOUS | Status: DC | PRN
  Administered 2017-01-29: 06:00:00 via INTRAVENOUS

## 2017-01-29 MED ORDER — SODIUM CHLORIDE 0.9 % IR SOLN
Status: DC | PRN
  Administered 2017-01-29: 500 mL

## 2017-01-29 SURGICAL SUPPLY — 38 items
ABRA ×4 IMPLANT
BANDAGE ACE 4X5 VEL STRL LF (GAUZE/BANDAGES/DRESSINGS) ×2 IMPLANT
BNDG GAUZE ELAST 4 BULKY (GAUZE/BANDAGES/DRESSINGS) ×2 IMPLANT
CONT SPECI 4OZ STER CLIK (MISCELLANEOUS) ×2 IMPLANT
DECANTER SPIKE VIAL GLASS SM (MISCELLANEOUS) ×2 IMPLANT
DRAPE INCISE IOBAN 66X45 STRL (DRAPES) ×2 IMPLANT
DRAPE LAPAROSCOPIC ABDOMINAL (DRAPES) ×2 IMPLANT
DRAPE LAPAROTOMY T 102X78X121 (DRAPES) ×2 IMPLANT
DRAPE UTILITY XL STRL (DRAPES) ×2 IMPLANT
DRESSING DUODERM 4X4 STERILE (GAUZE/BANDAGES/DRESSINGS) ×2 IMPLANT
DRSG ADAPTIC 3X8 NADH LF (GAUZE/BANDAGES/DRESSINGS) ×2 IMPLANT
DRSG PAD ABDOMINAL 8X10 ST (GAUZE/BANDAGES/DRESSINGS) IMPLANT
DRSG VAC ATS MED SENSATRAC (GAUZE/BANDAGES/DRESSINGS) ×2 IMPLANT
ELECT REM PT RETURN 15FT ADLT (MISCELLANEOUS) IMPLANT
GAUZE SPONGE 4X4 12PLY STRL (GAUZE/BANDAGES/DRESSINGS) ×2 IMPLANT
GLOVE BIO SURGEON STRL SZ 6.5 (GLOVE) ×6 IMPLANT
GOWN STRL REUS W/TWL LRG LVL3 (GOWN DISPOSABLE) ×4 IMPLANT
HANDPIECE INTERPULSE COAX TIP (DISPOSABLE)
KIT BASIN OR (CUSTOM PROCEDURE TRAY) ×2 IMPLANT
MATRIX WOUND 3-LAYER 7X10 (Tissue) ×2 IMPLANT
MICROMATRIX 1000MG (Tissue) ×2 IMPLANT
NEEDLE HYPO 22GX1.5 SAFETY (NEEDLE) IMPLANT
PACK GENERAL/GYN (CUSTOM PROCEDURE TRAY) ×2 IMPLANT
PAD CAST 4YDX4 CTTN HI CHSV (CAST SUPPLIES) ×1 IMPLANT
PADDING CAST COTTON 4X4 STRL (CAST SUPPLIES) ×1
PADDING CAST COTTON 6X4 STRL (CAST SUPPLIES) ×2 IMPLANT
SET ADHESIVE SKIN CLSR ABRA (MISCELLANEOUS) ×4 IMPLANT
SET HNDPC FAN SPRY TIP SCT (DISPOSABLE) IMPLANT
SOL PREP PROV IODINE SCRUB 4OZ (MISCELLANEOUS) ×2 IMPLANT
SOLUTION PARTIC MCRMTRX 1000MG (Tissue) ×1 IMPLANT
STAPLER VISISTAT 35W (STAPLE) ×2 IMPLANT
SUT MNCRL AB 4-0 PS2 18 (SUTURE) ×2 IMPLANT
SUT SILK 4 0 PS 2 (SUTURE) IMPLANT
SUT VIC AB 5-0 PS2 18 (SUTURE) ×2 IMPLANT
SWAB COLLECTION DEVICE MRSA (MISCELLANEOUS) ×2 IMPLANT
SWAB CULTURE ESWAB REG 1ML (MISCELLANEOUS) ×2 IMPLANT
SYR CONTROL 10ML LL (SYRINGE) ×2 IMPLANT
TOWEL OR 17X26 10 PK STRL BLUE (TOWEL DISPOSABLE) ×2 IMPLANT

## 2017-01-29 NOTE — Op Note (Signed)
DATE OF OPERATION: 01/29/2017  LOCATION: [facility]  PREOPERATIVE DIAGNOSIS: left leg wound chronic  POSTOPERATIVE DIAGNOSIS: Same  PROCEDURE: Excisional debridement of left leg wound skin, subcutaneous tissue and tendon 1.5 x 3 x 1 cm with Acell placement (sheet 7 x 10 cm and 1 gm powder) and ABRA  SURGEON: Taunja Brickner Sanger Kaliq Lege, DO  ASSISTANT: Shawn Rayburn, PA  EBL: 10 cc  CONDITION: Stable  COMPLICATIONS: None  INDICATION: The patient, Travis Salinas, is a 66 y.o. male born on [birth year], is here for treatment of a chronic left leg wound.   PROCEDURE DETAILS:  The patient was seen prior to surgery and marked.  The IV antibiotics were given. The patient was taken to the operating room and given a general anesthetic. A standard time out was performed and all information was confirmed by those in the room.  The leg was prepped and draped in the usual sterile fashion.  There was purulent drainage coming from the tendon area.  Cultures were obtained. The leg was irrigated with antibiotic solution and saline.  The tendon was not intact.  The remnants proximal and distal were debrided with the #10 blade.  The skin and subcutaneous tissue was excised with the #10 blade for the 1.5 x 3 cm wound.  Hemostasis was achieved with electrocautery.  The area was then packed with the Acell powder.  All of it was used. The sheet was applied and tucked under the skin for a 3 x 6 cm piece.  The adaptic was applied and secured with 5-0 Vicryl.  The VAC was applied with restore around the wound.  The ABRA was placed.  The patient was allowed to wake up and taken to recovery room in stable condition at the end of the case. The family was notified at the end of the case.

## 2017-01-29 NOTE — Progress Notes (Signed)
Spoke with patient and spouse at bedside. Patient has wound vac, has had this previously with [facility]. Dr. Marla Roe has sign vac auth form and this has been faxed to [facility]. Patient states he did not have [place] previously, the doctor changed the vac when he went to the office. Awaiting final cultures, no picc line currently.

## 2017-01-29 NOTE — Transfer of Care (Signed)
Immediate Anesthesia Transfer of Care Note  Patient: [place]  Procedure(s) Performed: Procedure(s): IRRIGATION AND DEBRIDEMENT OF LEFT LEG WOUND WITH ABRA PLACEMENT AND POSSIBLE CLOSURE (Left)  Patient Location: PACU  Anesthesia Type:General  Level of Consciousness:  sedated, patient cooperative and responds to stimulation  Airway & Oxygen Therapy:Patient Spontanous Breathing and Patient connected to face mask oxgen  Post-op Assessment:  Report given to PACU RN and Post -op Vital signs reviewed and stable  Post vital signs:  Reviewed and stable  Last Vitals:  Vitals:   01/28/17 2113 01/29/17 0541  BP: (!) 144/52 (!) 151/63  Pulse: 70 77  Resp: 18 18  Temp: 80.0 C     Complications: No apparent anesthesia complications

## 2017-01-29 NOTE — Anesthesia Preprocedure Evaluation (Signed)
Anesthesia Evaluation  Patient identified by MRN, date of birth, ID band Patient awake    Reviewed: Allergy & Precautions, NPO status , Patient's Chart, lab work & pertinent test results  Airway Mallampati: II  TM Distance: >3 FB     Dental   Pulmonary COPD, former smoker,    breath sounds clear to auscultation       Cardiovascular hypertension,  Rhythm:Regular Rate:Normal     Neuro/Psych  Headaches,    GI/Hepatic Bowel prep,(+) Hepatitis -  Endo/Other    Renal/GU Renal disease     Musculoskeletal   Abdominal   Peds  Hematology   Anesthesia Other Findings   Reproductive/Obstetrics                             Anesthesia Physical Anesthesia Plan  ASA: III  Anesthesia Plan: General   Post-op Pain Management:    Induction: Intravenous  Airway Management Planned: Oral ETT  Additional Equipment:   Intra-op Plan:   Post-operative Plan: Extubation in OR  Informed Consent: I have reviewed the patients History and Physical, chart, labs and discussed the procedure including the risks, benefits and alternatives for the proposed anesthesia with the patient or authorized representative who has indicated his/her understanding and acceptance.   Dental advisory given  Plan Discussed with: CRNA and Anesthesiologist  Anesthesia Plan Comments:         Anesthesia Quick Evaluation

## 2017-01-29 NOTE — H&P (Signed)
Travis Salinas Travis Salinas is an 66 y.o. male.   Chief Complaint: left leg wound HPI: The patient is a 66 yrs old wm here for treatment of his left leg wound.  He has undergone intervention in the past and had the leg wound closed.  It has opened again and he was admitted with concerns of infection / cellulitis.  He is receiving antibiotics.   Past Medical History:  Diagnosis Date  . Arthritis   . Cervical disc herniation   . COPD (chronic obstructive pulmonary disease) ([place])   . Headache    " due to antibiotics"  . Hepatitis C   . Hypertension     Past Surgical History:  Procedure Laterality Date  . APPLICATION OF A-CELL OF EXTREMITY Left 12/05/2016   Procedure: APPLICATION OF A-CELL OF EXTREMITY;  Surgeon: Loel Lofty Dillingham, DO;  Location: [facility];  Service: Plastics;  Laterality: Left;  . APPLICATION OF WOUND VAC Left 12/05/2016   Procedure: APPLICATION OF WOUND VAC;  Surgeon: Loel Lofty Dillingham, DO;  Location: [facility];  Service: Plastics;  Laterality: Left;  . I&D EXTREMITY Left 11/03/2016   Procedure: IRRIGATION AND DEBRIDEMENT EXTREMITY;  Surgeon: Leandrew Koyanagi, MD;  Location: [facility];  Service: Orthopedics;  Laterality: Left;  . I&D EXTREMITY Left 12/05/2016   Procedure: IRRIGATION AND DEBRIDEMENT EXTREMITY;  Surgeon: Loel Lofty Dillingham, DO;  Location: [facility];  Service: Plastics;  Laterality: Left;  . INCISION AND DRAINAGE ABSCESS Right 11/12/2013   Procedure: INCISION AND DRAINAGE AND OPEN PACKING OF RIGHT CALF  ABSCESS;  Surgeon: Earnstine Regal, MD;  Location: [facility];  Service: General;  Laterality: Right;  . KNEE SURGERY      Family History  Problem Relation Age of Onset  . Heart disease Mother   . Stroke Mother   . Heart disease Father   . Stroke Father    Social History:  reports that he has quit smoking. His smoking use included Cigarettes. He has a 12.50 pack-year smoking history. He has never used smokeless tobacco. He reports that he uses drugs, including Heroin. He reports that he  does not drink alcohol.  Allergies:  Allergies  Allergen Reactions  . Propoxyphene Nausea And Vomiting  . Ciprofloxacin Nausea Only  . Depakote [Divalproex Sodium] Other (See Comments)    hallucinations    Medications Prior to Admission  Medication Sig Dispense Refill  . amLODipine (NORVASC) 10 MG tablet Take 10 mg by mouth every morning.    . ARIPiprazole (ABILIFY) 15 MG tablet Take 15 mg by mouth every morning.   5  . Ascorbic Acid (VITAMIN C) 100 MG tablet Take 500 mg by mouth every morning.    Marland Kitchen aspirin EC 81 MG tablet Take 1 tablet (81 mg total) by mouth daily. (Patient taking differently: Take 81 mg by mouth every morning. ) 90 tablet 3  . atenolol (TENORMIN) 50 MG tablet Take 25 mg by mouth every morning.   0  . carisoprodol (SOMA) 250 MG tablet Take 250 mg by mouth every 8 (eight) hours as needed (neck pain, spasms).   0  . clonazePAM (KLONOPIN) 1 MG tablet Take 1 tablet (1 mg total) by mouth 2 (two) times daily as needed for anxiety. (Patient taking differently: Take 1 mg by mouth 2 (two) times daily. ) 4 tablet 0  . FLUoxetine (PROZAC) 20 MG capsule Take 1 capsule (20 mg total) by mouth daily. (Patient taking differently: Take 20 mg by mouth every morning. ) 30 capsule  0  . lamoTRIgine (LAMICTAL) 200 MG tablet Take 200 mg by mouth every morning.    . Multiple Vitamin (MULTIVITAMIN) capsule Take 1 capsule by mouth every morning.    Marland Kitchen oxyCODONE (OXYCONTIN) 40 mg 12 hr tablet Take 40 mg by mouth every 8 (eight) hours as needed (pain).   0  . PROAIR HFA 108 (90 Base) MCG/ACT inhaler Inhale 2 puffs into the lungs every 6 (six) hours as needed for wheezing or shortness of breath.   0  . RAPAFLO 8 MG CAPS capsule Take 8 mg by mouth daily with breakfast.   3  . sildenafil (VIAGRA) 50 MG tablet Take 50 mg by mouth as needed for erectile dysfunction.     Marland Kitchen zinc gluconate 50 MG tablet Take 250 mg by mouth every morning.      Results for orders placed or performed during the hospital  encounter of 01/25/17 (from the past 48 hour(s))  Creatinine, serum     Status: Abnormal   Collection Time: 01/28/17  4:07 AM  Result Value Ref Range   Creatinine, Ser 1.28 (H) 0.61 - 1.24 mg/dL   GFR calc non Af Amer 58 (L) >60 mL/min   GFR calc Af Amer >60 >60 mL/min    Comment: (NOTE) The eGFR has been calculated using the CKD EPI equation. This calculation has not been validated in all clinical situations. eGFR's persistently <60 mL/min signify possible Chronic Kidney Disease.   CBC     Status: Abnormal   Collection Time: 01/29/17  5:11 AM  Result Value Ref Range   WBC 10.6 (H) 4.0 - 10.5 K/uL   RBC 4.22 4.22 - 5.81 MIL/uL   Hemoglobin 11.8 (L) 13.0 - 17.0 g/dL   HCT 37.1 (L) 39.0 - 52.0 %   MCV 87.9 78.0 - 100.0 fL   MCH 28.0 26.0 - 34.0 pg   MCHC 31.8 30.0 - 36.0 g/dL   RDW 14.3 11.5 - 15.5 %   Platelets 296 150 - 400 K/uL  Basic metabolic panel     Status: Abnormal   Collection Time: 01/29/17  5:11 AM  Result Value Ref Range   Sodium 139 135 - 145 mmol/L   Potassium 4.4 3.5 - 5.1 mmol/L   Chloride 104 101 - 111 mmol/L   CO2 25 22 - 32 mmol/L   Glucose, Bld 101 (H) 65 - 99 mg/dL   BUN 12 6 - 20 mg/dL   Creatinine, Ser 1.10 0.61 - 1.24 mg/dL   Calcium 9.3 8.9 - 10.3 mg/dL   GFR calc non Af Amer >60 >60 mL/min   GFR calc Af Amer >60 >60 mL/min    Comment: (NOTE) The eGFR has been calculated using the CKD EPI equation. This calculation has not been validated in all clinical situations. eGFR's persistently <60 mL/min signify possible Chronic Kidney Disease.    Anion gap 10 5 - 15  Surgical pcr screen     Status: None   Collection Time: 01/29/17  5:40 AM  Result Value Ref Range   MRSA, PCR NEGATIVE NEGATIVE   Staphylococcus aureus NEGATIVE NEGATIVE    Comment:        The Xpert SA Assay (FDA approved for NASAL specimens in patients over 49 years of age), is one component of a comprehensive surveillance program.  Test performance has been validated by  [facility] for patients greater than or equal to 68 year old. It is not intended to diagnose infection nor to guide or monitor treatment.  No results found.  Review of Systems  Constitutional: Positive for fever.  HENT: Negative.   Eyes: Negative.   Respiratory: Negative.   Cardiovascular: Negative.   Gastrointestinal: Negative.   Genitourinary: Negative.   Musculoskeletal: Negative.   Neurological: Positive for weakness.  Psychiatric/Behavioral: Negative.     Blood pressure (!) 151/63, pulse 77, temperature 98 F (36.7 C), temperature source Oral, resp. rate 18, height 6' (1.829 m), weight 70.8 kg (156 lb), SpO2 96 %. Physical Exam  Constitutional: He is oriented to person, place, and time. He appears well-developed and well-nourished.  HENT:  Head: Normocephalic and atraumatic.  Eyes: EOM are normal. Pupils are equal, round, and reactive to light.  Cardiovascular: Normal rate.   Respiratory: Effort normal. No respiratory distress.  GI: Soft. He exhibits no distension.  Musculoskeletal: He exhibits edema and tenderness.  Neurological: He is alert and oriented to person, place, and time.  Skin: Skin is warm. There is erythema.  Psychiatric: He has a normal mood and affect. His behavior is normal. Judgment and thought content normal.     Assessment/Plan Plan for debridement with possible Acell, VAC and ABRA placement to left leg.  Wallace Going, DO 01/29/2017, 7:16 AM

## 2017-01-29 NOTE — Anesthesia Procedure Notes (Signed)
Procedure Name: Intubation Date/Time: 01/29/2017 7:46 AM Performed by: Ott Zimmerle, Virgel Gess Pre-anesthesia Checklist: Patient identified, Emergency Drugs available, Suction available, Patient being monitored and Timeout performed Patient Re-evaluated:Patient Re-evaluated prior to inductionOxygen Delivery Method: Circle system utilized Preoxygenation: Pre-oxygenation with 100% oxygen Intubation Type: IV induction Ventilation: Mask ventilation without difficulty Laryngoscope Size: Mac and 4 Grade View: Grade I Tube type: Oral Tube size: 7.5 mm Number of attempts: 1 Airway Equipment and Method: Stylet Placement Confirmation: ETT inserted through vocal cords under direct vision,  positive ETCO2,  CO2 detector and breath sounds checked- equal and bilateral Secured at: 22 cm Tube secured with: Tape Dental Injury: Teeth and Oropharynx as per pre-operative assessment

## 2017-01-29 NOTE — Progress Notes (Addendum)
Patient ID: Travis Salinas, male   DOB: [birth year], 66 y.o.   MRN: [id number]    PROGRESS NOTE  Travis Salinas  NTZ:[id number] DOB: 11-13-1950 DOA: 01/25/2017  PCP: Kathyrn Lass, MD   Brief Narrative:  Pt is 66 yo male with known hep C and polysubstance abuse including cocaine and opiates, COPD, presented with recurrent and persistent infection in the left lower extremity, just recently completed course of ABX in April and was initially scheduled for surgery on 5/31. However, the wound in left extremity started to look worse and he presented to [facility] ED.   Assessment & Plan:   Principal Problem:   Left lower extremity abscess/cellulitis in the setting of chronic and slow healing wound - placed on Vanc and Zosyn initially, transitioned to Ancef on 5/22 - keep on same regimen for now and follow up on culture report  - pt is now s/p I&D, done this AM, allow analgesia as needed - advance diet, will discuss with ID and plastic surgery duration of ABX (pt has seen Dr. Linus Salmons int he past) - CBC In AM to follow up on WBC  Active Problems:   HTN (hypertension), essential  - reasonable inpatient control - continue Norvasc and Atenolol     CKD stage II - III - Cr is now WNL - BMP iN AM    Polysubstance abuse - tox screen positive for cocaine and opiates  - cessation consultation provided     Depression - stable clinical status this am - continue home regimen with Prozac, Klonopin, Lamictal     BPH - continue Flomax   DVT prophylaxis: Lovenox SQ Code Status: Full  Family Communication: Patient and wife at bedside  Disposition Plan: Home once wound cultures are back   Consultants:   Plastic surgery   Procedures:   I&D 5/23 -->  Antimicrobials:   Ancef -->   Subjective: Just came back from I&D, reports feeling better, wants to start eating.   Objective: Vitals:   01/28/17 1155 01/28/17 1444 01/28/17 2113 01/29/17 0541  BP:  (!) 129/57 (!) 144/52 (!) 151/63  Pulse:   74 70 77  Resp:  18 18 18   Temp:  98.3 F (36.8 C) 98 F (36.7 C)   TempSrc:  Oral Oral Oral  SpO2: 95% 96% 95% 96%  Weight:      Height:        Intake/Output Summary (Last 24 hours) at 01/29/17 0826 Last data filed at 01/29/17 0810  Gross per 24 hour  Intake             1470 ml  Output             2600 ml  Net            -1130 ml   Filed Weights   01/25/17 2300  Weight: 70.8 kg (156 lb)   Examination:  General exam: Appears calm and comfortable  Respiratory system: Clear to auscultation. Respiratory effort normal. Cardiovascular system: S1 & S2 heard, RRR. No JVD, murmurs, rubs, gallops or clicks. No pedal edema. Gastrointestinal system: Abdomen is nondistended, soft and nontender. No organomegaly or masses felt.  Central nervous system: Alert and oriented. No focal neurological deficits. Extremities: Symmetric 5 x 5 power. Skin: LLE in wrap  Data Reviewed: I have personally reviewed following labs and imaging studies  CBC:  Recent Labs Lab 01/25/17 1642 01/26/17 0424 01/27/17 0422 01/29/17 0511  WBC 13.7* 11.7* 11.9* 10.6*  NEUTROABS 8.0* 4.9  --   --  HGB 11.5* 11.0* 11.3* 11.8*  HCT 36.6* 35.8* 36.5* 37.1*  MCV 89.5 90.2 87.5 87.9  PLT PLATELET CLUMPS NOTED ON SMEAR, COUNT APPEARS ADEQUATE [phone]   Basic Metabolic Panel:  Recent Labs Lab 01/25/17 1642 01/26/17 0424 01/27/17 0422 01/28/17 0407 01/29/17 0511  NA 139 140 138  --  139  K 4.9 3.9 3.9  --  4.4  CL 107 107 106  --  104  CO2 25 25 26   --  25  GLUCOSE 89 111* 111*  --  101*  BUN 17 17 11   --  12  CREATININE 1.34* 1.20 1.03 1.28* 1.10  CALCIUM 9.2 8.4* 9.0  --  9.3   Liver Function Tests:  Recent Labs Lab 01/26/17 0424  AST 16  ALT 10*  ALKPHOS 57  BILITOT 0.3  PROT 6.0*  ALBUMIN 3.4*   Coagulation Profile:  Recent Labs Lab 01/26/17 0424  INR 1.03   Urine analysis:    Component Value Date/Time   COLORURINE YELLOW 01/26/2017 0800   APPEARANCEUR CLEAR 01/26/2017  0800   LABSPEC 1.017 01/26/2017 0800   PHURINE 5.0 01/26/2017 0800   GLUCOSEU NEGATIVE 01/26/2017 0800   HGBUR NEGATIVE 01/26/2017 0800   BILIRUBINUR NEGATIVE 01/26/2017 0800   KETONESUR NEGATIVE 01/26/2017 0800   PROTEINUR NEGATIVE 01/26/2017 0800   UROBILINOGEN 1.0 11/14/2013 1424   NITRITE NEGATIVE 01/26/2017 0800   LEUKOCYTESUR NEGATIVE 01/26/2017 0800   Recent Results (from the past 240 hour(s))  Blood culture (routine x 2)     Status: None (Preliminary result)   Collection Time: 01/25/17  4:32 PM  Result Value Ref Range Status   Specimen Description BLOOD RIGHT UPPER ARM  Final   Special Requests   Final    BOTTLES DRAWN AEROBIC AND ANAEROBIC Blood Culture adequate volume   Culture   Final    NO GROWTH 2 DAYS Performed at [facility], [address], [place], [place] [address]    Report Status PENDING  Incomplete  Blood culture (routine x 2)     Status: None (Preliminary result)   Collection Time: 01/25/17  4:41 PM  Result Value Ref Range Status   Specimen Description BLOOD LEFT ANTECUBITAL  Final   Special Requests IN PEDIATRIC BOTTLE Blood Culture adequate volume  Final   Culture   Final    NO GROWTH 2 DAYS Performed at [facility], [place] [address] [place], [place] [address]    Report Status PENDING  Incomplete  Aerobic/Anaerobic Culture (surgical/deep wound)     Status: None (Preliminary result)   Collection Time: 01/25/17  4:55 PM  Result Value Ref Range Status   Specimen Description WOUND LEFT LOWER LEG  Final   Special Requests NONE  Final   Gram Stain   Final    FEW WBC PRESENT,BOTH PMN AND MONONUCLEAR ABUNDANT GRAM NEGATIVE RODS MODERATE GRAM POSITIVE COCCI IN PAIRS FEW GRAM POSITIVE RODS    Culture   Final    CULTURE REINCUBATED FOR BETTER GROWTH Performed at [facility], [place] [address], [place], [place] [address]    Report Status PENDING  Incomplete  Surgical pcr screen     Status: None   Collection Time: 01/29/17  5:40 AM    Result Value Ref Range Status   MRSA, PCR NEGATIVE NEGATIVE Final   Staphylococcus aureus NEGATIVE NEGATIVE Final    Radiology Studies: No results found.  Scheduled Meds: . albuterol  2.5 mg Nebulization BID  . amLODipine  10 mg Oral q morning - 10a  . ARIPiprazole  15 mg Oral Daily  . aspirin EC  81 mg Oral Daily  . atenolol  25 mg Oral Daily  . enoxaparin (LOVENOX) injection  40 mg Subcutaneous QHS  . fentaNYL      . FLUoxetine  20 mg Oral Daily  . lamoTRIgine  200 mg Oral q morning - 10a  . tamsulosin  0.4 mg Oral QPC supper    Continuous Infusions: . [MAR Hold]  ceFAZolin (ANCEF) IV 0 g (01/29/17 0100)     LOS: 4 days   Time spent: 20 minutes   Faye Ramsay, MD [facility] Pager [phone]  If 7PM-7AM, please contact night-coverage [email or web address] Password [facility] 01/29/2017, 8:26 AM

## 2017-01-29 NOTE — Anesthesia Postprocedure Evaluation (Signed)
Anesthesia Post Note  Patient: Travis Salinas  Procedure(s) Performed: Procedure(s) (LRB): IRRIGATION AND DEBRIDEMENT OF LEFT LEG WOUND WITH ABRA PLACEMENT AND PLACEMENT OF WOUND VAC (Left)  Patient location during evaluation: PACU Anesthesia Type: General Level of consciousness: awake Pain management: pain level controlled Vital Signs Assessment: post-procedure vital signs reviewed and stable Respiratory status: spontaneous breathing Cardiovascular status: stable Anesthetic complications: no       Last Vitals:  Vitals:   01/29/17 0850 01/29/17 0900  BP:  (!) 148/74  Pulse: 87 91  Resp: (!) 21 18  Temp:      Last Pain:  Vitals:   01/29/17 0541  TempSrc: Oral  PainSc:                  Travis Salinas

## 2017-01-30 ENCOUNTER — Encounter (HOSPITAL_COMMUNITY): Payer: Self-pay | Admitting: Plastic Surgery

## 2017-01-30 LAB — BASIC METABOLIC PANEL
Anion gap: 9 (ref 5–15)
BUN: 18 mg/dL (ref 6–20)
CALCIUM: 9.4 mg/dL (ref 8.9–10.3)
CO2: 24 mmol/L (ref 22–32)
CREATININE: 1.08 mg/dL (ref 0.61–1.24)
Chloride: 103 mmol/L (ref 101–111)
Glucose, Bld: 133 mg/dL — ABNORMAL HIGH (ref 65–99)
Potassium: 4.3 mmol/L (ref 3.5–5.1)
SODIUM: 136 mmol/L (ref 135–145)

## 2017-01-30 LAB — CBC
HCT: 35.9 % — ABNORMAL LOW (ref 39.0–52.0)
Hemoglobin: 11.3 g/dL — ABNORMAL LOW (ref 13.0–17.0)
MCH: 27.6 pg (ref 26.0–34.0)
MCHC: 31.5 g/dL (ref 30.0–36.0)
MCV: 87.6 fL (ref 78.0–100.0)
PLATELETS: 311 10*3/uL (ref 150–400)
RBC: 4.1 MIL/uL — ABNORMAL LOW (ref 4.22–5.81)
RDW: 14.2 % (ref 11.5–15.5)
WBC: 15.9 10*3/uL — ABNORMAL HIGH (ref 4.0–10.5)

## 2017-01-30 MED ORDER — MORPHINE SULFATE (PF) 10 MG/ML IV SOLN: 2.0000 mg | INTRAVENOUS | Status: DC | PRN

## 2017-01-30 MED ORDER — MORPHINE SULFATE (PF) 2 MG/ML IV SOLN
2.0000 mg | INTRAVENOUS | Status: DC | PRN
  Administered 2017-01-30 – 2017-01-31 (×4): 2 mg via INTRAVENOUS
  Filled 2017-01-30 (×4): qty 1

## 2017-01-30 NOTE — Progress Notes (Signed)
Patient ID: Travis Salinas, male   DOB: Nov 14, 1950, 66 y.o.   MRN: [id number]    PROGRESS NOTE    Nikolus Marczak  EXH:[id number] DOB: [birth year] DOA: 01/25/2017  PCP: Kathyrn Lass, MD   Brief Narrative:  Pt is 66 yo male with known hep C and polysubstance abuse including cocaine and opiates, COPD, presented with recurrent and persistent infection in the left lower extremity, just recently completed course of ABX in April and was initially scheduled for surgery on 5/31. However, the wound in left extremity started to look worse and he presented to [facility] ED.  Assessment & Plan:  Principal Problem:   Left lower extremity abscess/cellulitis in the setting of chronic and slow healing wound - Patient is status post I&D, 5/23, now postop day 1 - Patient reports feeling better however reported fever overnight, I do not see any temperature higher than 98.37F - White blood cell count is up compared to yesterday and suspect this is related to postop reaction - patient is currently on Ancef, started on 5/22 - Prior to that was done bank and Zosyn which have been discontinued - So far wound culture report is pending, blood cultures negative to date - We will discuss his infectious disease, choice of antibiotic - Suspect that patient will likely need PICC line however, known history of cocaine abuse, raises concern of recurrent abuse  Active Problems:   HTN (hypertension), essential  - SBP 130's this AM    CKD stage II - III - Cr is stable and WNL this AM     Polysubstance abuse - tox screen positive for cocaine and opiates     Depression - clinical status stable this AM     BPH - asymptomatic this AM   DVT prophylaxis: Lovenox SQ Code Status: Full  Family Communication: Patient at bedside  Disposition Plan: Home once WBC improves   Consultants:   Plastic surgery   Procedures:   I&D 5/23 -->  Antimicrobials:   Ancef 5/22 -->   Subjective: Pt reports having some  chills and ? Fevers.   Objective: Vitals:   01/30/17 0129 01/30/17 0557 01/30/17 0916 01/30/17 0934  BP: 136/60 (!) 150/57  137/77  Pulse: 61 (!) 59  66  Resp: 16 16  16   Temp: 98.4 F (36.9 C) 97.8 F (36.6 C)  97.9 F (36.6 C)  TempSrc: Oral Oral  Axillary  SpO2: 97% 97% 97% 99%  Weight:      Height:        Intake/Output Summary (Last 24 hours) at 01/30/17 1243 Last data filed at 01/30/17 0934  Gross per 24 hour  Intake             1830 ml  Output             2950 ml  Net            -1120 ml   Filed Weights   01/25/17 2300  Weight: 70.8 kg (156 lb)    Examination:  General exam: Appears calm and comfortable  Respiratory system: Clear to auscultation. Respiratory effort normal. Cardiovascular system: S1 & S2 heard, RRR. No JVD, murmurs, rubs, gallops or clicks. No pedal edema. Gastrointestinal system: Abdomen is nondistended, soft and nontender. No organomegaly or masses felt.  Extremities: Symmetric 5 x 5 power.LLE in wrap  Data Reviewed: I have personally reviewed following labs and imaging studies  CBC:  Recent Labs Lab 01/25/17 1642 01/26/17 0424 01/27/17 0422 01/29/17 3810  01/30/17 0445  WBC 13.7* 11.7* 11.9* 10.6* 15.9*  NEUTROABS 8.0* 4.9  --   --   --   HGB 11.5* 11.0* 11.3* 11.8* 11.3*  HCT 36.6* 35.8* 36.5* 37.1* 35.9*  MCV 89.5 90.2 87.5 87.9 87.6  PLT PLATELET CLUMPS NOTED ON SMEAR, COUNT APPEARS ADEQUATE 276 251 296 062   Basic Metabolic Panel:  Recent Labs Lab 01/25/17 1642 01/26/17 0424 01/27/17 0422 01/28/17 0407 01/29/17 0511 01/30/17 0445  NA 139 140 138  --  139 136  K 4.9 3.9 3.9  --  4.4 4.3  CL 107 107 106  --  104 103  CO2 25 25 26   --  25 24  GLUCOSE 89 111* 111*  --  101* 133*  BUN 17 17 11   --  12 18  CREATININE 1.34* 1.20 1.03 1.28* 1.10 1.08  CALCIUM 9.2 8.4* 9.0  --  9.3 9.4   Liver Function Tests:  Recent Labs Lab 01/26/17 0424  AST 16  ALT 10*  ALKPHOS 57  BILITOT 0.3  PROT 6.0*  ALBUMIN 3.4*    Coagulation Profile:  Recent Labs Lab 01/26/17 0424  INR 1.03   Urine analysis:    Component Value Date/Time   COLORURINE YELLOW 01/26/2017 0800   APPEARANCEUR CLEAR 01/26/2017 0800   LABSPEC 1.017 01/26/2017 0800   PHURINE 5.0 01/26/2017 0800   GLUCOSEU NEGATIVE 01/26/2017 0800   HGBUR NEGATIVE 01/26/2017 0800   BILIRUBINUR NEGATIVE 01/26/2017 0800   KETONESUR NEGATIVE 01/26/2017 0800   PROTEINUR NEGATIVE 01/26/2017 0800   UROBILINOGEN 1.0 11/14/2013 1424   NITRITE NEGATIVE 01/26/2017 0800   LEUKOCYTESUR NEGATIVE 01/26/2017 0800   Recent Results (from the past 240 hour(s))  Blood culture (routine x 2)     Status: None (Preliminary result)   Collection Time: 01/25/17  4:32 PM  Result Value Ref Range Status   Specimen Description BLOOD RIGHT UPPER ARM  Final   Special Requests   Final    BOTTLES DRAWN AEROBIC AND ANAEROBIC Blood Culture adequate volume   Culture   Final    NO GROWTH 3 DAYS Performed at [facility], [address], [place], [place] [address]    Report Status PENDING  Incomplete  Blood culture (routine x 2)     Status: None (Preliminary result)   Collection Time: 01/25/17  4:41 PM  Result Value Ref Range Status   Specimen Description BLOOD LEFT ANTECUBITAL  Final   Special Requests IN PEDIATRIC BOTTLE Blood Culture adequate volume  Final   Culture   Final    NO GROWTH 3 DAYS Performed at [facility], [place] [address], [place], [place] [address]    Report Status PENDING  Incomplete  Aerobic/Anaerobic Culture (surgical/deep wound)     Status: Abnormal (Preliminary result)   Collection Time: 01/25/17  4:55 PM  Result Value Ref Range Status   Specimen Description WOUND LEFT LOWER LEG  Final   Special Requests NONE  Final   Gram Stain   Final    FEW WBC PRESENT,BOTH PMN AND MONONUCLEAR ABUNDANT GRAM NEGATIVE RODS MODERATE GRAM POSITIVE COCCI IN PAIRS FEW GRAM POSITIVE RODS Performed at [facility], [place] [address]  [place], [place] [address]    Culture (A)  Final    MULTIPLE ORGANISMS PRESENT, NONE PREDOMINANT NO ANAEROBES ISOLATED; CULTURE IN PROGRESS FOR 5 DAYS    Report Status PENDING  Incomplete  Surgical pcr screen     Status: None   Collection Time: 01/29/17  5:40 AM  Result Value Ref Range Status   MRSA, PCR NEGATIVE NEGATIVE Final   Staphylococcus aureus NEGATIVE NEGATIVE Final    Comment:        The Xpert SA Assay (FDA approved for NASAL specimens in patients over 64 years of age), is one component of a comprehensive surveillance program.  Test performance has been validated by [facility] for patients greater than or equal to 56 year old. It is not intended to diagnose infection nor to guide or monitor treatment.   Aerobic/Anaerobic Culture (surgical/deep wound)     Status: None (Preliminary result)   Collection Time: 01/29/17  8:41 AM  Result Value Ref Range Status   Specimen Description WOUND LEFT LEG  Final   Special Requests PATIENT ON FOLLOWING ANCEF  Final   Gram Stain   Final    MODERATE WBC PRESENT, PREDOMINANTLY PMN NO ORGANISMS SEEN Performed at [facility], [address], [place], [place] [address]    Culture PENDING  Incomplete   Report Status PENDING  Incomplete    Radiology Studies: No results found.  Scheduled Meds: . albuterol  2.5 mg Nebulization BID  . amLODipine  10 mg Oral q morning - 10a  . ARIPiprazole  15 mg Oral Daily  . aspirin EC  81 mg Oral Daily  . atenolol  25 mg Oral Daily  . enoxaparin (LOVENOX) injection  40 mg Subcutaneous QHS  . FLUoxetine  20 mg Oral Daily  . lamoTRIgine  200 mg Oral q morning - 10a  . tamsulosin  0.4 mg Oral QPC supper   Continuous Infusions: .  ceFAZolin (ANCEF) IV 1 g (01/30/17 0944)    LOS: 5 days   Time spent: 20 minutes   Faye Ramsay, MD [facility] Pager [phone]  If 7PM-7AM, please contact night-coverage [email or web address] Password [facility] 01/30/2017, 12:43 PM

## 2017-01-30 NOTE — Progress Notes (Signed)
Wound vac has been delivered to room. Nurse paged Dr. Doyle Askew to make Dr. Doyle Askew aware of delivery and to check discharge status.   Per Dr. Doyle Askew patient is not being discharged today due to elevated WBC. Nurse will inform patient.

## 2017-01-31 LAB — BASIC METABOLIC PANEL
Anion gap: 10 (ref 5–15)
Anion gap: 7 (ref 5–15)
BUN: 22 mg/dL — AB (ref 6–20)
BUN: 20 mg/dL (ref 6–20)
CALCIUM: 9.6 mg/dL (ref 8.9–10.3)
CALCIUM: 9.5 mg/dL (ref 8.9–10.3)
CO2: 26 mmol/L (ref 22–32)
CO2: 28 mmol/L (ref 22–32)
CREATININE: 1.41 mg/dL — AB (ref 0.61–1.24)
CREATININE: 1.2 mg/dL (ref 0.61–1.24)
Chloride: 103 mmol/L (ref 101–111)
Chloride: 105 mmol/L (ref 101–111)
GFR calc Af Amer: 59 mL/min — ABNORMAL LOW (ref >60)
GFR calc Af Amer: >60 mL/min (ref >60)
GFR calc non Af Amer: 51 mL/min — ABNORMAL LOW (ref >60)
GFR calc non Af Amer: >60 mL/min (ref >60)
GLUCOSE: 92 mg/dL (ref 65–99)
GLUCOSE: 94 mg/dL (ref 65–99)
Potassium: 4.8 mmol/L (ref 3.5–5.1)
Potassium: 3.9 mmol/L (ref 3.5–5.1)
Sodium: 139 mmol/L (ref 135–145)
Sodium: 140 mmol/L (ref 135–145)

## 2017-01-31 LAB — CBC
HEMATOCRIT: 38.7 % — AB (ref 39.0–52.0)
Hemoglobin: 12.1 g/dL — ABNORMAL LOW (ref 13.0–17.0)
MCH: 27.6 pg (ref 26.0–34.0)
MCHC: 31.3 g/dL (ref 30.0–36.0)
MCV: 88.2 fL (ref 78.0–100.0)
Platelets: 330 10*3/uL (ref 150–400)
RBC: 4.39 MIL/uL (ref 4.22–5.81)
RDW: 14 % (ref 11.5–15.5)
WBC: 12.9 10*3/uL — ABNORMAL HIGH (ref 4.0–10.5)

## 2017-01-31 LAB — CULTURE, BLOOD (ROUTINE X 2)
CULTURE: NO GROWTH
Culture: NO GROWTH
SPECIAL REQUESTS: ADEQUATE
Special Requests: ADEQUATE

## 2017-01-31 LAB — AEROBIC/ANAEROBIC CULTURE (SURGICAL/DEEP WOUND)

## 2017-01-31 LAB — AEROBIC/ANAEROBIC CULTURE W GRAM STAIN (SURGICAL/DEEP WOUND)

## 2017-01-31 MED ORDER — AMOXICILLIN-POT CLAVULANATE 875-125 MG PO TABS: 1.0000 | ORAL_TABLET | Freq: Two times a day (BID) | ORAL | 0 refills | Status: AC

## 2017-01-31 MED ORDER — ACETAMINOPHEN 325 MG PO TABS: 650.0000 mg | ORAL_TABLET | Freq: Four times a day (QID) | ORAL | Status: DC | PRN

## 2017-01-31 NOTE — Discharge Instructions (Signed)

## 2017-01-31 NOTE — Progress Notes (Signed)
Per attending, patient will likely need IV antibiotics at d/c. Awaiting final decision on antibiotic regimen, still needs PICC placed prior to d/c. [facility] following for [facility] services, awaiting final orders.

## 2017-01-31 NOTE — Progress Notes (Signed)
Discharge instructions discussed with patient and family, verbalized agreement and understanding 

## 2017-01-31 NOTE — Discharge Summary (Addendum)
Physician Discharge Summary  Travis Salinas VEH:[id number] DOB: [birth year] DOA: 01/25/2017  PCP: Kathyrn Lass, MD  Admit date: 01/25/2017 Discharge date: 01/31/2017  Recommendations for Outpatient Follow-up:  1. Pt will need to follow up with PCP in 1-2 weeks post discharge 2. Please obtain BMP to evaluate electrolytes and kidney function 3. Please also check CBC to evaluate Hg and Hct levels 4. Please also note that pt was discharged on Augmentin BID for total 6 weeks therapy 5. Please also note that initial recommendations was for pt to be discharged on IV ABX with PICC line but due to recurrent drug abuse, more specifically cocaine, it was determined that pt is unsafe to be discharged home with PICC line due to potential abuse and worrisome for developing possible endocarditis from abuse, the only option was to discharge him home on oral ABX.  6. Please note surgical wound cultures pending on discharge 7. If pt does indeed require IV ABX, it was determined that the only safe option would be to have placement arranged at SNF so that IV ABX can be given via PICC line  8. Once the final surgical wound cultures are back, there may be a need for change in ABX but since pt wants to go home today will have to follow up on this closely, ID team and plastic surgery team were OK with pt being discharged and close follow up 9. Pt also discharged with wound vac   Discharge Diagnoses:  Principal Problem:   Cellulitis Active Problems:   HTN (hypertension)   Depression   Abscess   Leukocytosis  Discharge Condition: Stable  Diet recommendation: Heart healthy diet discussed in details   History of present illness:  Pt is 66 yo male with known hep C and polysubstance abuse including cocaine and opiates, COPD, presented with recurrent and persistent infection in the left lower extremity, just recently completed course of ABX in April and was initially scheduled for surgery on 5/31. However, the wound  in left extremity started to look worse and he presented to [facility] ED.  Assessment & Plan:  Principal Problem: Left lower extremity abscess/cellulitis in the setting of chronic and slow healing wound - Patient is status post I&D, 5/23, now postop day 3 - pt reports feeling better and wants to go home  - WBC is trending down  - pt was on vanc and zosyn, transitioned to ancef and after discussion with ID doctor, pt determined to be at high risk for recurrent drug abuse - for that reason, no PICC line was recommended and the only option was to place pt on oral ABX  - surgical wound cultures from 5/23 I&D, still pending and will need to be followed up closely - change in ABX may be needed once final wound cultures are back but at this time pt wants to go home and ID/surgery team was ok with that as long as pt follows closely and pt was advised of that   Active Problems: HTN (hypertension), essential  - reasonable inpatient control     Acute kidney injury imposed on CKD stage II  - suspect pre renal etiology - oral intake encouraged and pt tolerating well - Cr is trending down and is WNL on discharge   Polysubstance abuse - counseled on cessation   Depression - no signs of depression this AM   BPH - remains asymptomatic   DVT prophylaxis: Lovenox SQ Code Status: Full  Family Communication: Patient at bedside  Disposition Plan: Home  Procedures/Studies: Dg Tibia/fibula Left  Result Date: 01/25/2017 CLINICAL DATA:  Open wound to anterior distal 1/3 of left tibia. Wound x 3 months; has worsened and gotten larger since yest. No fever. EXAM: LEFT TIBIA AND FIBULA - 2 VIEW COMPARISON:  Ankle radiographs, 11/03/2016 FINDINGS: No fracture. No bone lesion. No areas of bone resorption is seen to suggest osteomyelitis. There is nonspecific subcutaneous edema and/or cellulitis extending from the mid leg to the ankle. No soft tissue air. IMPRESSION: 1. No fracture or bone  lesion. 2. No evidence of osteomyelitis. 3. No soft tissue air. Electronically Signed   By: Lajean Manes M.D.   On: 01/25/2017 17:54   Consultations:  Plastic surgery  ID team over the phone, Dr. Tommy Medal   Antibiotics:  Initially on vanc and zosyn, change to Ancef and again changed to Augmentin on discharge to complete therapy for 6 more weeks per ID team recommendations   Discharge Exam: Vitals:   01/30/17 2051 01/31/17 0543  BP: (!) 142/66 (!) 141/71  Pulse: (!) 58 (!) 53  Resp: 16 16  Temp: 98 F (36.7 C) 97.8 F (36.6 C)   Vitals:   01/30/17 2051 01/30/17 2110 01/31/17 0543 01/31/17 0855  BP: (!) 142/66  (!) 141/71   Pulse: (!) 58  (!) 53   Resp: 16  16   Temp: 98 F (36.7 C)  97.8 F (36.6 C)   TempSrc: Oral  Oral   SpO2: 97% 97% 95% 95%  Weight:      Height:        General: Pt is alert, follows commands appropriately, not in acute distress Cardiovascular: Regular rate and rhythm, S1/S2 +, no murmurs, no rubs, no gallops Respiratory: Clear to auscultation bilaterally, no wheezing, no crackles, no rhonchi Abdominal: Soft, non tender, non distended, bowel sounds +, no guarding Extremities: LLL with wound vac   Discharge Instructions  Discharge Instructions    AMB Referral to [facility]    Complete by:  As directed    Please assign UMR member for post discharge call. Currently at [facility]. Denies any Link to Wellness program needs at this time. Please call with questions. [name], [place], RN,BSN-THN [facility] [id number]-[phone]   Reason for consult:  Please assign UMR member for post discharge call   Diagnoses of:  COPD/ Pneumonia   Expected date of contact:  1-3 days (reserved for hospital discharges)   Diet - low sodium heart healthy    Complete by:  As directed    Increase activity slowly    Complete by:  As directed      Allergies as of 01/31/2017      Reactions   Propoxyphene Nausea And Vomiting   Ciprofloxacin Nausea  Only   Depakote [divalproex Sodium] Other (See Comments)   hallucinations      Medication List    TAKE these medications   acetaminophen 325 MG tablet Commonly known as:  TYLENOL Take 2 tablets (650 mg total) by mouth every 6 (six) hours as needed for mild pain (or Fever >/= 101).   amLODipine 10 MG tablet Commonly known as:  NORVASC Take 10 mg by mouth every morning.   amoxicillin-clavulanate 875-125 MG tablet Commonly known as:  AUGMENTIN Take 1 tablet by mouth 2 (two) times daily.   ARIPiprazole 15 MG tablet Commonly known as:  ABILIFY Take 15 mg by mouth every morning.   aspirin EC 81 MG tablet Take 1 tablet (81 mg total) by mouth daily.  What changed:  when to take this   atenolol 50 MG tablet Commonly known as:  TENORMIN Take 25 mg by mouth every morning.   carisoprodol 250 MG tablet Commonly known as:  SOMA Take 250 mg by mouth every 8 (eight) hours as needed (neck pain, spasms).   clonazePAM 1 MG tablet Commonly known as:  KLONOPIN Take 1 tablet (1 mg total) by mouth 2 (two) times daily as needed for anxiety. What changed:  when to take this   FLUoxetine 20 MG capsule Commonly known as:  PROZAC Take 1 capsule (20 mg total) by mouth daily. What changed:  when to take this   lamoTRIgine 200 MG tablet Commonly known as:  LAMICTAL Take 200 mg by mouth every morning.   multivitamin capsule Take 1 capsule by mouth every morning.   oxyCODONE 40 mg 12 hr tablet Commonly known as:  OXYCONTIN Take 40 mg by mouth every 8 (eight) hours as needed (pain).   PROAIR HFA 108 (90 Base) MCG/ACT inhaler Generic drug:  albuterol Inhale 2 puffs into the lungs every 6 (six) hours as needed for wheezing or shortness of breath.   RAPAFLO 8 MG Caps capsule Generic drug:  silodosin Take 8 mg by mouth daily with breakfast.   sildenafil 50 MG tablet Commonly known as:  VIAGRA Take 50 mg by mouth as needed for erectile dysfunction.   vitamin C 100 MG tablet Take 500 mg  by mouth every morning.   zinc gluconate 50 MG tablet Take 250 mg by mouth every morning.      Follow-up Information    Dillingham, Loel Lofty, DO Follow up in 1 week(s).   Specialty:  Plastic Surgery Contact information: [place] [address] [phone]        Theodis Blaze, MD Follow up.   Specialty:  Internal Medicine Contact information: [address] [place] [address] [phone]            The results of significant diagnostics from this hospitalization (including imaging, microbiology, ancillary and laboratory) are listed below for reference.     Microbiology: Recent Results (from the past 240 hour(s))  Blood culture (routine x 2)     Status: None   Collection Time: 01/25/17  4:32 PM  Result Value Ref Range Status   Specimen Description BLOOD RIGHT UPPER ARM  Final   Special Requests   Final    BOTTLES DRAWN AEROBIC AND ANAEROBIC Blood Culture adequate volume   Culture   Final    NO GROWTH 5 DAYS Performed at [facility], [address], [place], [place] [address]    Report Status 01/31/2017 FINAL  Final  Blood culture (routine x 2)     Status: None   Collection Time: 01/25/17  4:41 PM  Result Value Ref Range Status   Specimen Description BLOOD LEFT ANTECUBITAL  Final   Special Requests IN PEDIATRIC BOTTLE Blood Culture adequate volume  Final   Culture   Final    NO GROWTH 5 DAYS Performed at [facility], [place] [address], [place], [place] [address]    Report Status 01/31/2017 FINAL  Final  Aerobic/Anaerobic Culture (surgical/deep wound)     Status: Abnormal   Collection Time: 01/25/17  4:55 PM  Result Value Ref Range Status   Specimen Description WOUND LEFT LOWER LEG  Final   Special Requests NONE  Final   Gram Stain   Final    FEW WBC  PRESENT,BOTH PMN AND MONONUCLEAR ABUNDANT GRAM NEGATIVE RODS MODERATE GRAM POSITIVE COCCI IN PAIRS FEW GRAM POSITIVE RODS Performed at [facility], [place] [address], [place], [place] [address]    Culture (A)  Final    MULTIPLE ORGANISMS PRESENT, NONE PREDOMINANT MIXED ANAEROBIC FLORA PRESENT.  CALL LAB IF FURTHER IID REQUIRED.    Report Status 01/31/2017 FINAL  Final  Surgical pcr screen     Status: None   Collection Time: 01/29/17  5:40 AM  Result Value Ref Range Status   MRSA, PCR NEGATIVE NEGATIVE Final   Staphylococcus aureus NEGATIVE NEGATIVE Final    Comment:        The Xpert SA Assay (FDA approved for NASAL specimens in patients over 34 years of age), is one component of a comprehensive surveillance program.  Test performance has been validated by [facility] for patients greater than or equal to 14 year old. It is not intended to diagnose infection nor to guide or monitor treatment.   Aerobic/Anaerobic Culture (surgical/deep wound)     Status: None (Preliminary result)   Collection Time: 01/29/17  8:41 AM  Result Value Ref Range Status   Specimen Description WOUND LEFT LEG  Final   Special Requests PATIENT ON FOLLOWING ANCEF  Final   Gram Stain   Final    MODERATE WBC PRESENT, PREDOMINANTLY PMN NO ORGANISMS SEEN    Culture   Final    NO GROWTH 2 DAYS Performed at [facility], [address], [place], [place] [address]    Report Status PENDING  Incomplete     Labs: Basic Metabolic Panel:  Recent Labs Lab 01/27/17 0422 01/28/17 0407 01/29/17 0511 01/30/17 0445 01/31/17 0353 01/31/17 1139  NA 138  --  139 136 139 140  K 3.9  --  4.4 4.3 4.8 3.9  CL 106  --  104 103 103 105  CO2 26  --  25 24 26 28   GLUCOSE 111*  --  101* 133* 92 94  BUN 11  --  12 18 22* 20  CREATININE 1.03 1.28* 1.10 1.08 1.41* 1.20  CALCIUM 9.0  --  9.3 9.4 9.6 9.5   Liver Function Tests:  Recent Labs Lab 01/26/17 0424  AST 16  ALT 10*  ALKPHOS 57  BILITOT 0.3  PROT 6.0*  ALBUMIN 3.4*   CBC:  Recent Labs Lab 01/25/17 1642 01/26/17 0424 01/27/17 0422 01/29/17 0511 01/30/17 0445 01/31/17 0353   WBC 13.7* 11.7* 11.9* 10.6* 15.9* 12.9*  NEUTROABS 8.0* 4.9  --   --   --   --   HGB 11.5* 11.0* 11.3* 11.8* 11.3* 12.1*  HCT 36.6* 35.8* 36.5* 37.1* 35.9* 38.7*  MCV 89.5 90.2 87.5 87.9 87.6 88.2  PLT PLATELET CLUMPS NOTED ON SMEAR, COUNT APPEARS ADEQUATE 276 251 296 311 330    SIGNED: Time coordinating discharge: 30 minutes  Faye Ramsay, MD  [facility] 01/31/2017, 2:32 PM Pager [phone]  If 7PM-7AM, please contact night-coverage [email or web address] Password [email or web address]

## 2017-02-04 ENCOUNTER — Other Ambulatory Visit: Payer: Self-pay | Admitting: *Deleted

## 2017-02-04 ENCOUNTER — Encounter: Payer: Self-pay | Admitting: *Deleted

## 2017-02-04 NOTE — Patient Outreach (Signed)
[facility]) Care Management  02/04/2017  [place] 1951-05-26 [id number]   Subjective: Telephone call to patient's home number, no answer, left HIPAA compliant voicemail message, and requested call back.   Objective: Per chart review, patient hospitalized 01/25/17 - 01/31/17 for Cellulitis left lower extremity.  Status post Excisional debridement of left leg wound skin, subcutaneous tissue and tendon 1.5 x 3 x 1 cm with Acell placement (sheet 7 x 10 cm and 1 gm powder) on 01/29/17.   Patient also has a history of hypertension, polysubstance abuse, and chronic kidney disease stage 2.    Assessment: Received [facility]MR Transition of care referral on 01/28/17.  Transition of care follow up pending patient contact.    Plan: RNCM will call patient for 2nd telephone outreach attempt, transition of care follow up, within 10 business days if no return call.   Kirsten Spearing H. Annia Friendly, BSN, [facility] Telephonic CM Phone: [phone] Fax: [phone]

## 2017-02-04 NOTE — Patient Outreach (Addendum)
[facility]) Care Management  02/04/2017  Travis Salinas 12-29-1950 [id number]   Subjective: Received voicemail message from patient's wife, states she is returning call for husband, and request call back.  Telephone call to patient's mobile number, spoke with patient, and HIPAA verified.  Discussed [facility] Care Management UMR Transition of care follow up, patient voiced understanding, and is in agreement to follow up.   Patient states he is doing well and has a follow up appointment with MD on 02/07/17.  States he is in good health other than bag leg.   States he has a history of arthritis, COPD, and ruptured disc.  He gave RNCM verbal consent to speak with wife Lasandra Beech) regarding healthcare needs as needed.  States has been legally separated for 14 years, lives with a room mate, wife takes care of all of his medical, business, and personal needs.  Patient states he does not have any transition of care, care coordination, disease management, disease monitoring, transportation, community resource, or pharmacy needs at this time.  States he is very appreciative of the follow up and is in agreement to receive [place] Management information.  Telephone call to patient's wife, per patient's request.   Wife stated patient's name, date of birth, and address.   Discussed [facility] Care Management UMR Transition of care follow up.  Wife voices understanding, is appreciative of follow up, does not have any questions or care management needs on patient's behalf.   Objective: Per chart review, patient hospitalized 01/25/17 - 01/31/17 for Cellulitis left lower extremity.  Status post Excisional debridement of left leg wound skin, subcutaneous tissue and tendon 1.5 x 3 x 1 cm with Acell placement (sheet 7 x 10 cm and 1 gm powder) on 01/29/17.   Patient also has a history of hypertension, polysubstance abuse, and chronic kidney disease stage 2.    Assessment: Received UMR Transition of care  referral on 01/28/17. Transition of care follow up completed, no care management needs, and will proceed with case closure.    Plan: RNCM will send patient successful outreach letter, [facility] pamphlet, and magnet. RNCM will send case closure due to follow up completed / no care management needs request to [facility].   Galdino Hinchman H. Annia Friendly, BSN, [place] Management [facility] Telephonic CM Phone: [phone] Fax: [phone]

## 2017-02-05 ENCOUNTER — Ambulatory Visit: Payer: Self-pay | Admitting: *Deleted

## 2017-02-07 ENCOUNTER — Encounter (HOSPITAL_COMMUNITY): Payer: Self-pay | Admitting: Plastic Surgery

## 2017-02-07 DIAGNOSIS — S81802D Unspecified open wound, left lower leg, subsequent encounter: Secondary | ICD-10-CM | POA: Diagnosis not present

## 2017-02-07 LAB — AEROBIC/ANAEROBIC CULTURE W GRAM STAIN (SURGICAL/DEEP WOUND): Culture: NO GROWTH

## 2017-02-07 LAB — AEROBIC/ANAEROBIC CULTURE (SURGICAL/DEEP WOUND)

## 2017-02-07 NOTE — Addendum Note (Signed)
Addendum  created 02/07/17 1824 by Belinda Block, MD   Anesthesia Event edited, Anesthesia Staff edited

## 2017-02-10 NOTE — Addendum Note (Signed)
Addendum  created 02/10/17 1259 by Oleta Mouse, MD   Sign clinical note

## 2017-02-14 DIAGNOSIS — S81802D Unspecified open wound, left lower leg, subsequent encounter: Secondary | ICD-10-CM | POA: Diagnosis not present

## 2017-02-18 DIAGNOSIS — S81802D Unspecified open wound, left lower leg, subsequent encounter: Secondary | ICD-10-CM | POA: Diagnosis not present

## 2017-02-24 DIAGNOSIS — S81802D Unspecified open wound, left lower leg, subsequent encounter: Secondary | ICD-10-CM | POA: Diagnosis not present

## 2017-03-01 DIAGNOSIS — T8189XA Other complications of procedures, not elsewhere classified, initial encounter: Secondary | ICD-10-CM | POA: Diagnosis not present

## 2017-03-03 DIAGNOSIS — S81802D Unspecified open wound, left lower leg, subsequent encounter: Secondary | ICD-10-CM | POA: Diagnosis not present

## 2017-03-10 DIAGNOSIS — M47812 Spondylosis without myelopathy or radiculopathy, cervical region: Secondary | ICD-10-CM | POA: Diagnosis not present

## 2017-03-10 DIAGNOSIS — S81802D Unspecified open wound, left lower leg, subsequent encounter: Secondary | ICD-10-CM | POA: Diagnosis not present

## 2017-03-17 DIAGNOSIS — S81802D Unspecified open wound, left lower leg, subsequent encounter: Secondary | ICD-10-CM | POA: Diagnosis not present

## 2017-03-19 ENCOUNTER — Other Ambulatory Visit: Payer: 59

## 2017-03-19 DIAGNOSIS — B182 Chronic viral hepatitis C: Secondary | ICD-10-CM | POA: Diagnosis not present

## 2017-03-19 DIAGNOSIS — H903 Sensorineural hearing loss, bilateral: Secondary | ICD-10-CM | POA: Diagnosis not present

## 2017-03-20 DIAGNOSIS — I10 Essential (primary) hypertension: Secondary | ICD-10-CM | POA: Diagnosis not present

## 2017-03-20 DIAGNOSIS — N529 Male erectile dysfunction, unspecified: Secondary | ICD-10-CM | POA: Diagnosis not present

## 2017-03-21 LAB — HEPATITIS C RNA QUANTITATIVE
HCV QUANT: NOT DETECTED [IU]/mL
HCV Quantitative Log: <1.18 Log IU/mL

## 2017-03-25 DIAGNOSIS — S81802D Unspecified open wound, left lower leg, subsequent encounter: Secondary | ICD-10-CM | POA: Diagnosis not present

## 2017-03-26 ENCOUNTER — Ambulatory Visit (INDEPENDENT_AMBULATORY_CARE_PROVIDER_SITE_OTHER): Payer: 59 | Admitting: Pharmacist Clinician (PhC)/ Clinical Pharmacy Specialist

## 2017-03-26 DIAGNOSIS — B182 Chronic viral hepatitis C: Secondary | ICD-10-CM | POA: Diagnosis not present

## 2017-03-26 DIAGNOSIS — H903 Sensorineural hearing loss, bilateral: Secondary | ICD-10-CM | POA: Diagnosis not present

## 2017-03-26 NOTE — Patient Instructions (Signed)
You do not need to follow up with [place] anymore

## 2017-03-26 NOTE — Progress Notes (Signed)
HPI: Phuoc Huy is a 66 y.o. male who is here for he is cure visit with pharmacy.   No results found for: HCVGENOTYPE, HEPCGENOTYPE  Allergies: Allergies  Allergen Reactions  . Propoxyphene Nausea And Vomiting  . Ciprofloxacin Nausea Only  . Depakote [Divalproex Sodium] Other (See Comments)    hallucinations    Vitals:    Past Medical History: Past Medical History:  Diagnosis Date  . Arthritis   . Cervical disc herniation   . COPD (chronic obstructive pulmonary disease) ([place])   . Headache    " due to antibiotics"  . Hepatitis C   . Hypertension     Social History: Social History   Social History  . Marital status: Married    Spouse name: N/A  . Number of children: N/A  . Years of education: N/A   Social History Main Topics  . Smoking status: Former Smoker    Packs/day: 0.50    Years: 25.00    Types: Cigarettes  . Smokeless tobacco: Never Used  . Alcohol use No  . Drug use: Yes    Types: Heroin     Comment: " relapsed once this year " 2018  . Sexual activity: Not on file   Other Topics Concern  . Not on file   Social History Narrative  . No narrative on file    Labs: No results found for: HIV1RNAQUANT, HIV1RNAVL, CD4TABS, HEPBSAB, HEPBSAG, HCVAB  No results found for: HCVGENOTYPE, HEPCGENOTYPE  Hepatitis C RNA quantitative Latest Ref Rng & Units 03/19/2017 11/07/2016 09/19/2016 07/01/2016  HCV Quantitative NOT DETECTED IU/mL <15 NOT DETECTED QUANTITY NOT SUFFICIENT, UNABLE TO PERFORM TEST Not Detected Not Detected  HCV Quantitative Log NOT DETECTED Log IU/mL <1.18 NOT DETECTED - NOT CALC NOT CALC    AST (U/L)  Date Value  01/26/2017 16  11/04/2016 25  04/23/2016 28   ALT (U/L)  Date Value  01/26/2017 10 (L)  11/04/2016 15 (L)  04/23/2016 23   INR (no units)  Date Value  01/26/2017 1.03  11/03/2016 1.02    CrCl: CrCl cannot be calculated (Patient's most recent lab result is older than the maximum 21 days allowed.).  Fibrosis  Score: F2/3 as assessed by F2/3  Child-Pugh Score: Class A  Previous Treatment Regimen: Harvoni  Assessment: Berlie was re-infected with hep C after a course of Harvoni. His SVR24 was positive because of IV drug use. He is here again with is his wife, who is a Marine scientist at [employer]. This time the SVR24 is neg. Explained to both of them that this will be his last chance for treatment because this cure is not a vaccine. He could still be re-infected again if he uses drugs again. They were thankful to hear the news. Explained to him that time could reverse some of his fibrosis. Encouraged him to stay off of ETOH. He stopped drinking about 6 months ago.   Recommendations:  No long have to f/u with [place]  [facility], [place].D., BCPS, AAHIVP Clinical Infectious [place] [facility] 03/26/2017, 4:04 PM

## 2017-04-01 DIAGNOSIS — S81802D Unspecified open wound, left lower leg, subsequent encounter: Secondary | ICD-10-CM | POA: Diagnosis not present

## 2017-04-15 DIAGNOSIS — S81802D Unspecified open wound, left lower leg, subsequent encounter: Secondary | ICD-10-CM | POA: Diagnosis not present

## 2017-04-25 DIAGNOSIS — S81802D Unspecified open wound, left lower leg, subsequent encounter: Secondary | ICD-10-CM | POA: Diagnosis not present

## 2017-04-30 DIAGNOSIS — S81802D Unspecified open wound, left lower leg, subsequent encounter: Secondary | ICD-10-CM | POA: Diagnosis not present

## 2017-05-06 DIAGNOSIS — S81802D Unspecified open wound, left lower leg, subsequent encounter: Secondary | ICD-10-CM | POA: Diagnosis not present

## 2017-05-20 DIAGNOSIS — F39 Unspecified mood [affective] disorder: Secondary | ICD-10-CM | POA: Diagnosis not present

## 2017-05-20 DIAGNOSIS — S81802D Unspecified open wound, left lower leg, subsequent encounter: Secondary | ICD-10-CM | POA: Diagnosis not present

## 2017-05-20 DIAGNOSIS — F411 Generalized anxiety disorder: Secondary | ICD-10-CM | POA: Diagnosis not present

## 2017-05-20 DIAGNOSIS — F3341 Major depressive disorder, recurrent, in partial remission: Secondary | ICD-10-CM | POA: Diagnosis not present

## 2017-05-23 DIAGNOSIS — J441 Chronic obstructive pulmonary disease with (acute) exacerbation: Secondary | ICD-10-CM | POA: Diagnosis not present

## 2017-05-27 ENCOUNTER — Inpatient Hospital Stay (HOSPITAL_COMMUNITY): Payer: 59

## 2017-05-27 ENCOUNTER — Encounter (HOSPITAL_COMMUNITY): Payer: Self-pay | Admitting: Emergency Medicine

## 2017-05-27 ENCOUNTER — Emergency Department (HOSPITAL_COMMUNITY): Payer: 59

## 2017-05-27 ENCOUNTER — Inpatient Hospital Stay (HOSPITAL_COMMUNITY)
Admission: EM | Admit: 2017-05-27 | Discharge: 2017-06-13 | DRG: 871 | Disposition: A | Discharge status: Other Acute Inpatient Hospital | Payer: 59 | Attending: Internal Medicine | Admitting: Internal Medicine

## 2017-05-27 DIAGNOSIS — R6521 Severe sepsis with septic shock: Secondary | ICD-10-CM | POA: Diagnosis not present

## 2017-05-27 DIAGNOSIS — D649 Anemia, unspecified: Secondary | ICD-10-CM | POA: Diagnosis not present

## 2017-05-27 DIAGNOSIS — Z7951 Long term (current) use of inhaled steroids: Secondary | ICD-10-CM | POA: Diagnosis not present

## 2017-05-27 DIAGNOSIS — E874 Mixed disorder of acid-base balance: Secondary | ICD-10-CM | POA: Diagnosis present

## 2017-05-27 DIAGNOSIS — D6489 Other specified anemias: Secondary | ICD-10-CM | POA: Diagnosis present

## 2017-05-27 DIAGNOSIS — Z823 Family history of stroke: Secondary | ICD-10-CM

## 2017-05-27 DIAGNOSIS — F1910 Other psychoactive substance abuse, uncomplicated: Secondary | ICD-10-CM | POA: Diagnosis not present

## 2017-05-27 DIAGNOSIS — Z888 Allergy status to other drugs, medicaments and biological substances status: Secondary | ICD-10-CM | POA: Diagnosis not present

## 2017-05-27 DIAGNOSIS — Z7982 Long term (current) use of aspirin: Secondary | ICD-10-CM

## 2017-05-27 DIAGNOSIS — R0602 Shortness of breath: Secondary | ICD-10-CM | POA: Diagnosis not present

## 2017-05-27 DIAGNOSIS — J811 Chronic pulmonary edema: Secondary | ICD-10-CM | POA: Diagnosis not present

## 2017-05-27 DIAGNOSIS — Z8619 Personal history of other infectious and parasitic diseases: Secondary | ICD-10-CM | POA: Diagnosis not present

## 2017-05-27 DIAGNOSIS — J80 Acute respiratory distress syndrome: Secondary | ICD-10-CM | POA: Diagnosis not present

## 2017-05-27 DIAGNOSIS — F1410 Cocaine abuse, uncomplicated: Secondary | ICD-10-CM | POA: Diagnosis not present

## 2017-05-27 DIAGNOSIS — R0902 Hypoxemia: Secondary | ICD-10-CM

## 2017-05-27 DIAGNOSIS — Z79899 Other long term (current) drug therapy: Secondary | ICD-10-CM

## 2017-05-27 DIAGNOSIS — J449 Chronic obstructive pulmonary disease, unspecified: Secondary | ICD-10-CM | POA: Diagnosis present

## 2017-05-27 DIAGNOSIS — F410 Panic disorder [episodic paroxysmal anxiety] without agoraphobia: Secondary | ICD-10-CM | POA: Diagnosis present

## 2017-05-27 DIAGNOSIS — R7881 Bacteremia: Secondary | ICD-10-CM | POA: Diagnosis present

## 2017-05-27 DIAGNOSIS — E876 Hypokalemia: Secondary | ICD-10-CM | POA: Diagnosis present

## 2017-05-27 DIAGNOSIS — Z7952 Long term (current) use of systemic steroids: Secondary | ICD-10-CM

## 2017-05-27 DIAGNOSIS — J13 Pneumonia due to Streptococcus pneumoniae: Secondary | ICD-10-CM | POA: Diagnosis present

## 2017-05-27 DIAGNOSIS — D72829 Elevated white blood cell count, unspecified: Secondary | ICD-10-CM | POA: Diagnosis not present

## 2017-05-27 DIAGNOSIS — J9600 Acute respiratory failure, unspecified whether with hypoxia or hypercapnia: Secondary | ICD-10-CM | POA: Diagnosis present

## 2017-05-27 DIAGNOSIS — Z8249 Family history of ischemic heart disease and other diseases of the circulatory system: Secondary | ICD-10-CM | POA: Diagnosis not present

## 2017-05-27 DIAGNOSIS — I5031 Acute diastolic (congestive) heart failure: Secondary | ICD-10-CM | POA: Diagnosis not present

## 2017-05-27 DIAGNOSIS — F329 Major depressive disorder, single episode, unspecified: Secondary | ICD-10-CM | POA: Diagnosis present

## 2017-05-27 DIAGNOSIS — N400 Enlarged prostate without lower urinary tract symptoms: Secondary | ICD-10-CM | POA: Diagnosis present

## 2017-05-27 DIAGNOSIS — G8929 Other chronic pain: Secondary | ICD-10-CM | POA: Diagnosis present

## 2017-05-27 DIAGNOSIS — T380X5A Adverse effect of glucocorticoids and synthetic analogues, initial encounter: Secondary | ICD-10-CM | POA: Diagnosis present

## 2017-05-27 DIAGNOSIS — F1110 Opioid abuse, uncomplicated: Secondary | ICD-10-CM | POA: Diagnosis not present

## 2017-05-27 DIAGNOSIS — J440 Chronic obstructive pulmonary disease with acute lower respiratory infection: Secondary | ICD-10-CM | POA: Diagnosis present

## 2017-05-27 DIAGNOSIS — F1990 Other psychoactive substance use, unspecified, uncomplicated: Secondary | ICD-10-CM | POA: Diagnosis present

## 2017-05-27 DIAGNOSIS — J181 Lobar pneumonia, unspecified organism: Secondary | ICD-10-CM | POA: Diagnosis not present

## 2017-05-27 DIAGNOSIS — A419 Sepsis, unspecified organism: Secondary | ICD-10-CM

## 2017-05-27 DIAGNOSIS — N182 Chronic kidney disease, stage 2 (mild): Secondary | ICD-10-CM | POA: Diagnosis present

## 2017-05-27 DIAGNOSIS — Z681 Body mass index (BMI) 19 or less, adult: Secondary | ICD-10-CM | POA: Diagnosis not present

## 2017-05-27 DIAGNOSIS — I130 Hypertensive heart and chronic kidney disease with heart failure and stage 1 through stage 4 chronic kidney disease, or unspecified chronic kidney disease: Secondary | ICD-10-CM | POA: Diagnosis present

## 2017-05-27 DIAGNOSIS — F1920 Other psychoactive substance dependence, uncomplicated: Secondary | ICD-10-CM | POA: Diagnosis not present

## 2017-05-27 DIAGNOSIS — J9690 Respiratory failure, unspecified, unspecified whether with hypoxia or hypercapnia: Secondary | ICD-10-CM

## 2017-05-27 DIAGNOSIS — R05 Cough: Secondary | ICD-10-CM | POA: Diagnosis not present

## 2017-05-27 DIAGNOSIS — I10 Essential (primary) hypertension: Secondary | ICD-10-CM | POA: Diagnosis present

## 2017-05-27 DIAGNOSIS — J189 Pneumonia, unspecified organism: Secondary | ICD-10-CM | POA: Diagnosis not present

## 2017-05-27 DIAGNOSIS — J9601 Acute respiratory failure with hypoxia: Secondary | ICD-10-CM | POA: Diagnosis not present

## 2017-05-27 DIAGNOSIS — R6510 Systemic inflammatory response syndrome (SIRS) of non-infectious origin without acute organ dysfunction: Secondary | ICD-10-CM

## 2017-05-27 DIAGNOSIS — N179 Acute kidney failure, unspecified: Secondary | ICD-10-CM | POA: Diagnosis present

## 2017-05-27 DIAGNOSIS — Z87891 Personal history of nicotine dependence: Secondary | ICD-10-CM | POA: Diagnosis not present

## 2017-05-27 DIAGNOSIS — R6520 Severe sepsis without septic shock: Secondary | ICD-10-CM

## 2017-05-27 DIAGNOSIS — E43 Unspecified severe protein-calorie malnutrition: Secondary | ICD-10-CM | POA: Diagnosis not present

## 2017-05-27 DIAGNOSIS — Z79891 Long term (current) use of opiate analgesic: Secondary | ICD-10-CM

## 2017-05-27 DIAGNOSIS — B181 Chronic viral hepatitis B without delta-agent: Secondary | ICD-10-CM | POA: Diagnosis not present

## 2017-05-27 DIAGNOSIS — Z8701 Personal history of pneumonia (recurrent): Secondary | ICD-10-CM

## 2017-05-27 DIAGNOSIS — R7989 Other specified abnormal findings of blood chemistry: Secondary | ICD-10-CM | POA: Diagnosis not present

## 2017-05-27 DIAGNOSIS — Z881 Allergy status to other antibiotic agents status: Secondary | ICD-10-CM | POA: Diagnosis not present

## 2017-05-27 DIAGNOSIS — R918 Other nonspecific abnormal finding of lung field: Secondary | ICD-10-CM | POA: Diagnosis not present

## 2017-05-27 DIAGNOSIS — I471 Supraventricular tachycardia: Secondary | ICD-10-CM | POA: Diagnosis not present

## 2017-05-27 DIAGNOSIS — Z9981 Dependence on supplemental oxygen: Secondary | ICD-10-CM | POA: Diagnosis not present

## 2017-05-27 DIAGNOSIS — A403 Sepsis due to Streptococcus pneumoniae: Secondary | ICD-10-CM | POA: Diagnosis not present

## 2017-05-27 DIAGNOSIS — E440 Moderate protein-calorie malnutrition: Secondary | ICD-10-CM | POA: Diagnosis present

## 2017-05-27 DIAGNOSIS — N401 Enlarged prostate with lower urinary tract symptoms: Secondary | ICD-10-CM | POA: Diagnosis not present

## 2017-05-27 DIAGNOSIS — R9431 Abnormal electrocardiogram [ECG] [EKG]: Secondary | ICD-10-CM | POA: Diagnosis not present

## 2017-05-27 DIAGNOSIS — J441 Chronic obstructive pulmonary disease with (acute) exacerbation: Secondary | ICD-10-CM | POA: Diagnosis not present

## 2017-05-27 DIAGNOSIS — J188 Other pneumonia, unspecified organism: Secondary | ICD-10-CM | POA: Diagnosis not present

## 2017-05-27 DIAGNOSIS — B953 Streptococcus pneumoniae as the cause of diseases classified elsewhere: Secondary | ICD-10-CM | POA: Diagnosis not present

## 2017-05-27 DIAGNOSIS — I361 Nonrheumatic tricuspid (valve) insufficiency: Secondary | ICD-10-CM | POA: Diagnosis not present

## 2017-05-27 DIAGNOSIS — I5033 Acute on chronic diastolic (congestive) heart failure: Secondary | ICD-10-CM | POA: Diagnosis not present

## 2017-05-27 DIAGNOSIS — B9789 Other viral agents as the cause of diseases classified elsewhere: Secondary | ICD-10-CM | POA: Diagnosis present

## 2017-05-27 DIAGNOSIS — F418 Other specified anxiety disorders: Secondary | ICD-10-CM | POA: Diagnosis not present

## 2017-05-27 DIAGNOSIS — B182 Chronic viral hepatitis C: Secondary | ICD-10-CM | POA: Diagnosis present

## 2017-05-27 LAB — URINALYSIS, ROUTINE W REFLEX MICROSCOPIC
BACTERIA UA: NONE SEEN
BILIRUBIN URINE: NEGATIVE
GLUCOSE, UA: NEGATIVE mg/dL
HGB URINE DIPSTICK: NEGATIVE
KETONES UR: NEGATIVE mg/dL
LEUKOCYTES UA: NEGATIVE
NITRITE: NEGATIVE
PROTEIN: 100 mg/dL — AB
Specific Gravity, Urine: 1.014 (ref 1.005–1.030)
Squamous Epithelial / HPF: NONE SEEN
pH: 7 (ref 5.0–8.0)

## 2017-05-27 LAB — CBC WITH DIFFERENTIAL/PLATELET
BASOS ABS: 0 10*3/uL (ref 0.0–0.1)
BASOS PCT: 0 %
EOS ABS: 0 10*3/uL (ref 0.0–0.7)
Eosinophils Relative: 0 %
HEMATOCRIT: 36.2 % — AB (ref 39.0–52.0)
HEMOGLOBIN: 12.5 g/dL — AB (ref 13.0–17.0)
Lymphocytes Relative: 5 %
Lymphs Abs: 0.4 10*3/uL — ABNORMAL LOW (ref 0.7–4.0)
MCH: 28.2 pg (ref 26.0–34.0)
MCHC: 34.5 g/dL (ref 30.0–36.0)
MCV: 81.7 fL (ref 78.0–100.0)
MONO ABS: 0.2 10*3/uL (ref 0.1–1.0)
MONOS PCT: 2 %
NEUTROS ABS: 7.4 10*3/uL (ref 1.7–7.7)
NEUTROS PCT: 93 %
Platelets: 255 10*3/uL (ref 150–400)
RBC: 4.43 MIL/uL (ref 4.22–5.81)
RDW: 13.9 % (ref 11.5–15.5)
WBC: 8 10*3/uL (ref 4.0–10.5)

## 2017-05-27 LAB — BLOOD GAS, ARTERIAL
ACID-BASE DEFICIT: 2.6 mmol/L — AB (ref 0.0–2.0)
ACID-BASE DEFICIT: 3.2 mmol/L — AB (ref 0.0–2.0)
Bicarbonate: 19.8 mmol/L — ABNORMAL LOW (ref 20.0–28.0)
Bicarbonate: 19.6 mmol/L — ABNORMAL LOW (ref 20.0–28.0)
DRAWN BY: 295031
Drawn by: 295031
FIO2: 100
FIO2: 100
O2 Content: 10 L/min
O2 SAT: 89.1 %
O2 Saturation: 82.5 %
PATIENT TEMPERATURE: 98.6
PATIENT TEMPERATURE: 98.6
PH ART: 7.442 (ref 7.350–7.450)
PO2 ART: 48.6 mmHg — AB (ref 83.0–108.0)
pCO2 arterial: 28.2 mmHg — ABNORMAL LOW (ref 32.0–48.0)
pCO2 arterial: 29.2 mmHg — ABNORMAL LOW (ref 32.0–48.0)
pH, Arterial: 7.461 — ABNORMAL HIGH (ref 7.350–7.450)
pO2, Arterial: 58.5 mmHg — ABNORMAL LOW (ref 83.0–108.0)

## 2017-05-27 LAB — RAPID URINE DRUG SCREEN, HOSP PERFORMED
Amphetamines: NOT DETECTED
BARBITURATES: NOT DETECTED
Benzodiazepines: NOT DETECTED
Cocaine: POSITIVE — AB
Opiates: POSITIVE — AB
Tetrahydrocannabinol: NOT DETECTED

## 2017-05-27 LAB — COMPREHENSIVE METABOLIC PANEL
ALBUMIN: 3.8 g/dL (ref 3.5–5.0)
ALT: 15 U/L — ABNORMAL LOW (ref 17–63)
ANION GAP: 12 (ref 5–15)
AST: 49 U/L — AB (ref 15–41)
Alkaline Phosphatase: 49 U/L (ref 38–126)
BUN: 25 mg/dL — AB (ref 6–20)
CO2: 22 mmol/L (ref 22–32)
Calcium: 9.8 mg/dL (ref 8.9–10.3)
Chloride: 100 mmol/L — ABNORMAL LOW (ref 101–111)
Creatinine, Ser: 1.4 mg/dL — ABNORMAL HIGH (ref 0.61–1.24)
GFR calc Af Amer: 59 mL/min — ABNORMAL LOW (ref >60)
GFR calc non Af Amer: 51 mL/min — ABNORMAL LOW (ref >60)
GLUCOSE: 121 mg/dL — AB (ref 65–99)
POTASSIUM: 3.4 mmol/L — AB (ref 3.5–5.1)
SODIUM: 134 mmol/L — AB (ref 135–145)
Total Bilirubin: 0.7 mg/dL (ref 0.3–1.2)
Total Protein: 7.3 g/dL (ref 6.5–8.1)

## 2017-05-27 LAB — I-STAT CG4 LACTIC ACID, ED
Lactic Acid, Venous: 3.6 mmol/L (ref 0.5–1.9)
Lactic Acid, Venous: 2.34 mmol/L (ref 0.5–1.9)

## 2017-05-27 LAB — BRAIN NATRIURETIC PEPTIDE: B Natriuretic Peptide: 134.3 pg/mL — ABNORMAL HIGH (ref 0.0–100.0)

## 2017-05-27 LAB — PROCALCITONIN: PROCALCITONIN: 3.97 ng/mL

## 2017-05-27 LAB — MRSA PCR SCREENING: MRSA by PCR: NEGATIVE

## 2017-05-27 LAB — TROPONIN I: Troponin I: <0.03 ng/mL (ref <0.03)

## 2017-05-27 LAB — D-DIMER, QUANTITATIVE (NOT AT ARMC): D DIMER QUANT: 2.45 ug{FEU}/mL — AB (ref 0.00–0.50)

## 2017-05-27 LAB — INFLUENZA PANEL BY PCR (TYPE A & B)
INFLAPCR: NEGATIVE
Influenza B By PCR: NEGATIVE

## 2017-05-27 MED ORDER — PREDNISONE 20 MG PO TABS
50.0000 mg | ORAL_TABLET | Freq: Every day | ORAL | Status: DC
  Filled 2017-05-27: qty 1

## 2017-05-27 MED ORDER — FLUOXETINE HCL 20 MG PO CAPS
20.0000 mg | ORAL_CAPSULE | Freq: Every day | ORAL | Status: DC
  Administered 2017-05-27 – 2017-06-13 (×17): 20 mg via ORAL
  Filled 2017-05-27 (×18): qty 1

## 2017-05-27 MED ORDER — BENZONATATE 100 MG PO CAPS
200.0000 mg | ORAL_CAPSULE | Freq: Three times a day (TID) | ORAL | Status: DC | PRN
  Administered 2017-05-30 – 2017-06-08 (×4): 200 mg via ORAL
  Filled 2017-05-27 (×4): qty 2

## 2017-05-27 MED ORDER — LAMOTRIGINE 100 MG PO TABS
200.0000 mg | ORAL_TABLET | Freq: Every morning | ORAL | Status: DC
  Administered 2017-05-27 – 2017-06-13 (×18): 200 mg via ORAL
  Filled 2017-05-27 (×18): qty 2

## 2017-05-27 MED ORDER — TECHNETIUM TC 99M DIETHYLENETRIAME-PENTAACETIC ACID
29.9000 | Freq: Once | INTRAVENOUS | Status: AC | PRN
  Administered 2017-05-27: 29.9 via INTRAVENOUS

## 2017-05-27 MED ORDER — IBUPROFEN 200 MG PO TABS
400.0000 mg | ORAL_TABLET | Freq: Once | ORAL | Status: AC
  Administered 2017-05-27: 400 mg via ORAL
  Filled 2017-05-27: qty 2

## 2017-05-27 MED ORDER — FENTANYL CITRATE (PF) 100 MCG/2ML IJ SOLN
50.0000 ug | INTRAMUSCULAR | Status: DC | PRN
  Administered 2017-05-27: 50 ug via INTRAVENOUS
  Filled 2017-05-27: qty 2

## 2017-05-27 MED ORDER — DEXTROSE 5 % IV SOLN: 1.0000 g | INTRAVENOUS | Status: DC

## 2017-05-27 MED ORDER — LEVALBUTEROL HCL 1.25 MG/0.5ML IN NEBU
1.2500 mg | INHALATION_SOLUTION | Freq: Four times a day (QID) | RESPIRATORY_TRACT | Status: DC | PRN
Start: 2017-05-27 — End: 2017-05-28

## 2017-05-27 MED ORDER — SODIUM CHLORIDE 0.9 % IV BOLUS (SEPSIS)
30.0000 mL/kg | Freq: Once | INTRAVENOUS | Status: AC
  Administered 2017-05-27: 2000 mL via INTRAVENOUS

## 2017-05-27 MED ORDER — DM-GUAIFENESIN ER 30-600 MG PO TB12
1.0000 | ORAL_TABLET | Freq: Two times a day (BID) | ORAL | Status: DC
  Administered 2017-05-27 – 2017-06-13 (×34): 1 via ORAL
  Filled 2017-05-27: qty 1
  Filled 2017-05-27: qty 2
  Filled 2017-05-27 (×33): qty 1

## 2017-05-27 MED ORDER — SODIUM CHLORIDE 0.9 % IV SOLN
INTRAVENOUS | Status: DC
  Administered 2017-05-27 (×2): via INTRAVENOUS
  Administered 2017-05-28: 1000 mL via INTRAVENOUS
  Administered 2017-05-29: 15:00:00 via INTRAVENOUS

## 2017-05-27 MED ORDER — ASPIRIN EC 81 MG PO TBEC
81.0000 mg | DELAYED_RELEASE_TABLET | Freq: Every day | ORAL | Status: DC
  Administered 2017-05-27 – 2017-06-13 (×18): 81 mg via ORAL
  Filled 2017-05-27 (×18): qty 1

## 2017-05-27 MED ORDER — IPRATROPIUM BROMIDE 0.02 % IN SOLN
0.5000 mg | Freq: Four times a day (QID) | RESPIRATORY_TRACT | Status: DC
  Administered 2017-05-27 – 2017-05-28 (×2): 0.5 mg via RESPIRATORY_TRACT
  Filled 2017-05-27 (×2): qty 2.5

## 2017-05-27 MED ORDER — OXYCODONE HCL ER 10 MG PO T12A
40.0000 mg | EXTENDED_RELEASE_TABLET | Freq: Two times a day (BID) | ORAL | Status: DC
  Administered 2017-05-27: 40 mg via ORAL
  Filled 2017-05-27: qty 4

## 2017-05-27 MED ORDER — DEXTROSE 5 % IV SOLN
500.0000 mg | Freq: Once | INTRAVENOUS | Status: AC
  Administered 2017-05-27: 500 mg via INTRAVENOUS
  Filled 2017-05-27: qty 500

## 2017-05-27 MED ORDER — TAMSULOSIN HCL 0.4 MG PO CAPS
0.4000 mg | ORAL_CAPSULE | Freq: Every day | ORAL | Status: DC
  Administered 2017-05-27 – 2017-06-13 (×18): 0.4 mg via ORAL
  Filled 2017-05-27 (×18): qty 1

## 2017-05-27 MED ORDER — LEVALBUTEROL HCL 1.25 MG/0.5ML IN NEBU
1.2500 mg | INHALATION_SOLUTION | Freq: Four times a day (QID) | RESPIRATORY_TRACT | Status: DC
  Administered 2017-05-27 – 2017-05-28 (×4): 1.25 mg via RESPIRATORY_TRACT
  Filled 2017-05-27 (×4): qty 0.5

## 2017-05-27 MED ORDER — BISACODYL 5 MG PO TBEC
5.0000 mg | DELAYED_RELEASE_TABLET | Freq: Every day | ORAL | Status: DC | PRN
  Administered 2017-06-02: 5 mg via ORAL
  Filled 2017-05-27 (×2): qty 1

## 2017-05-27 MED ORDER — PIPERACILLIN-TAZOBACTAM 3.375 G IVPB
3.3750 g | Freq: Three times a day (TID) | INTRAVENOUS | Status: DC
  Administered 2017-05-27 – 2017-05-28 (×2): 3.375 g via INTRAVENOUS
  Filled 2017-05-27 (×2): qty 50

## 2017-05-27 MED ORDER — METHYLPREDNISOLONE SODIUM SUCC 125 MG IJ SOLR
60.0000 mg | Freq: Four times a day (QID) | INTRAMUSCULAR | Status: DC
  Administered 2017-05-27 – 2017-05-30 (×11): 60 mg via INTRAVENOUS
  Filled 2017-05-27 (×11): qty 2

## 2017-05-27 MED ORDER — CEFTRIAXONE SODIUM 1 G IJ SOLR
1.0000 g | Freq: Once | INTRAMUSCULAR | Status: AC
  Administered 2017-05-27: 1 g via INTRAVENOUS
  Filled 2017-05-27: qty 10

## 2017-05-27 MED ORDER — CLONAZEPAM 0.5 MG PO TABS
0.5000 mg | ORAL_TABLET | Freq: Two times a day (BID) | ORAL | Status: DC
  Administered 2017-05-27 – 2017-06-13 (×35): 0.5 mg via ORAL
  Filled 2017-05-27 (×36): qty 1

## 2017-05-27 MED ORDER — ATENOLOL 25 MG PO TABS
25.0000 mg | ORAL_TABLET | ORAL | Status: DC
  Administered 2017-05-27 – 2017-06-13 (×18): 25 mg via ORAL
  Filled 2017-05-27 (×22): qty 1

## 2017-05-27 MED ORDER — TECHNETIUM TO 99M ALBUMIN AGGREGATED
3.8000 | Freq: Once | INTRAVENOUS | Status: AC | PRN
  Administered 2017-05-27: 3.8 via INTRAVENOUS

## 2017-05-27 MED ORDER — ONDANSETRON HCL 4 MG/2ML IJ SOLN: 4.0000 mg | Freq: Four times a day (QID) | INTRAMUSCULAR | Status: DC | PRN

## 2017-05-27 MED ORDER — ARIPIPRAZOLE 10 MG PO TABS
20.0000 mg | ORAL_TABLET | Freq: Every day | ORAL | Status: DC
  Administered 2017-05-27 – 2017-06-13 (×17): 20 mg via ORAL
  Filled 2017-05-27: qty 4
  Filled 2017-05-27 (×17): qty 2

## 2017-05-27 MED ORDER — ACETAMINOPHEN 325 MG PO TABS
650.0000 mg | ORAL_TABLET | Freq: Four times a day (QID) | ORAL | Status: DC | PRN
  Administered 2017-05-31 – 2017-06-12 (×3): 650 mg via ORAL
  Filled 2017-05-27 (×4): qty 2

## 2017-05-27 MED ORDER — ONDANSETRON HCL 4 MG PO TABS
4.0000 mg | ORAL_TABLET | Freq: Four times a day (QID) | ORAL | Status: DC | PRN
  Administered 2017-05-28 (×2): 4 mg via ORAL
  Filled 2017-05-27 (×2): qty 1

## 2017-05-27 MED ORDER — AMLODIPINE BESYLATE 10 MG PO TABS
10.0000 mg | ORAL_TABLET | Freq: Every morning | ORAL | Status: DC
  Administered 2017-05-27 – 2017-06-03 (×8): 10 mg via ORAL
  Filled 2017-05-27 (×2): qty 1
  Filled 2017-05-27: qty 2
  Filled 2017-05-27 (×5): qty 1

## 2017-05-27 MED ORDER — DEXTROSE 5 % IV SOLN
500.0000 mg | INTRAVENOUS | Status: DC
  Administered 2017-05-28: 500 mg via INTRAVENOUS
  Filled 2017-05-27: qty 500

## 2017-05-27 MED ORDER — OXYCODONE-ACETAMINOPHEN 5-325 MG PO TABS
1.0000 | ORAL_TABLET | ORAL | Status: DC | PRN
  Administered 2017-05-27 – 2017-06-08 (×13): 2 via ORAL
  Filled 2017-05-27 (×7): qty 2
  Filled 2017-05-27: qty 1
  Filled 2017-05-27 (×7): qty 2

## 2017-05-27 MED ORDER — ENOXAPARIN SODIUM 40 MG/0.4ML ~~LOC~~ SOLN
40.0000 mg | SUBCUTANEOUS | Status: DC
  Administered 2017-05-27 – 2017-06-12 (×17): 40 mg via SUBCUTANEOUS
  Filled 2017-05-27 (×18): qty 0.4

## 2017-05-27 NOTE — Progress Notes (Signed)
Pharmacy Antibiotic Note  WINDSOR GOEKEN [name] is a 66 y.o. male admitted on 05/27/2017 with sepsis.  Pharmacy has been consulted for Zosyn dosing.  Plan: Zosyn 3.375g IV q8h (4 hour infusion time).     Temp (24hrs), Avg:99.3 F (37.4 C), Min:98 F (36.7 C), Max:101.7 F (38.7 C)   Recent Labs Lab 05/27/17 0419 05/27/17 0429 05/27/17 0837  WBC 8.0  --   --   CREATININE 1.40*  --   --   LATICACIDVEN  --  3.60* 2.34*    CrCl cannot be calculated (Unknown ideal weight.).    Allergies  Allergen Reactions  . Propoxyphene Nausea And Vomiting  . Amoxicillin Nausea And Vomiting  . Ciprofloxacin Nausea Only  . Depakote [Divalproex Sodium] Other (See Comments)    hallucinations     Thank you for allowing pharmacy to be a part of this patient's care.  Hershal Coria 05/27/2017 6:02 PM

## 2017-05-27 NOTE — ED Notes (Signed)
Per Nuc Med, VQ scan will take place in 2-3hrs.

## 2017-05-27 NOTE — Progress Notes (Signed)
This is a no charge note  Pending admission per Dr. Florina Ou  66 year old male with past medical history of COPD, hypertension, depression, anxiety, hepatitis C (treated), who presents with fever, chills, shortness of breath and cough. Chest x-ray showed bilateral perihilar infiltration, consistent with pneumonia. IV Rocephin and azithromycin were started.  Patient was found to have WBC 8.0, lactic acid is 3.60, potassium 3.4, creatinine 1.40, temperature 101.7, tachycardia, tachypnea, oxygen saturation of 74% on room air. Patient is admitted to telemetry bed as inpatient.    Travis Costa, MD  [facility] Pager [phone]  If 7PM-7AM, please contact night-coverage [email or web address] Password [email or web address] 05/27/2017, 6:06 AM

## 2017-05-27 NOTE — Progress Notes (Signed)
Pt. Transported to/from CT uneventfully.

## 2017-05-27 NOTE — ED Notes (Signed)
Call report to [facility], RN at [phone] at [phone].

## 2017-05-27 NOTE — Progress Notes (Signed)
Patient admitted to room 1426. Pt noted to be SOB. RR 41, o2 sat 70% on 6L nasal canula, HR 120. Pt placed on non-breather with little improvement (o2 sat 81%). Rapid Response called and Dr. Patrecia Pour, MD called and at bedside to evaluate patient. Pt transferred to step-down for further monitoring.

## 2017-05-27 NOTE — ED Notes (Signed)
Patient transported to X-ray 

## 2017-05-27 NOTE — ED Provider Notes (Signed)
[place] DEPT Provider Note: Georgena Spurling, MD, FACEP  CSN: [id number] MRN: [id number] ARRIVAL: 05/27/17 at [place]: RESB/RESB   CHIEF COMPLAINT  Shortness of Breath   HISTORY OF PRESENT ILLNESS  05/27/17 4:13 AM Travis Salinas is a 66 y.o. male with a history of COPD. He has had a several day history of cough and shortness of breath. He was seen by his PCP 4 days ago and diagnosed with COPD exacerbation. He was placed on doxycycline, prednisone and a codeine-based cough syrup. The symptoms worsened over the past several days but he refused to be reevaluated. He called his estranged wife this morning about 2 AM stating his breathing had gotten acutely worse, he was having fever and chills. His breathing had worsened to the point that he was having difficulty speaking. At home he was found to have a temperature of 102. He did not take anything for the fever. He had been using his inhaler without adequate relief of his dyspnea. On arrival he was noted to be tachypneic, tachycardic, febrile and hypoxic. His oxygen saturation was 74% on room air which is down from 97% on room air at his PCPs office.  He is also complaining of pain in his ribs when he coughs or takes a deep breath. He was also having a headache for the past few days but denies one presently.   Past Medical History:  Diagnosis Date  . Arthritis   . Cervical disc herniation   . COPD (chronic obstructive pulmonary disease) ([place])   . Headache    " due to antibiotics"  . Hepatitis C   . Hypertension     Past Surgical History:  Procedure Laterality Date  . APPLICATION OF A-CELL OF EXTREMITY Left 12/05/2016   Procedure: APPLICATION OF A-CELL OF EXTREMITY;  Surgeon: Loel Lofty Dillingham, DO;  Location: [place];  Service: Plastics;  Laterality: Left;  . APPLICATION OF WOUND VAC Left 12/05/2016   Procedure: APPLICATION OF WOUND VAC;  Surgeon: Loel Lofty Dillingham, DO;  Location: [place];  Service: Plastics;  Laterality:  Left;  . I&D EXTREMITY Left 11/03/2016   Procedure: IRRIGATION AND DEBRIDEMENT EXTREMITY;  Surgeon: Leandrew Koyanagi, MD;  Location: [place];  Service: Orthopedics;  Laterality: Left;  . I&D EXTREMITY Left 12/05/2016   Procedure: IRRIGATION AND DEBRIDEMENT EXTREMITY;  Surgeon: Loel Lofty Dillingham, DO;  Location: [place];  Service: Plastics;  Laterality: Left;  . INCISION AND DRAINAGE ABSCESS Right 11/12/2013   Procedure: INCISION AND DRAINAGE AND OPEN PACKING OF RIGHT CALF  ABSCESS;  Surgeon: Earnstine Regal, MD;  Location: [facility];  Service: General;  Laterality: Right;  . INCISION AND DRAINAGE OF WOUND Left 01/29/2017   Procedure: IRRIGATION AND DEBRIDEMENT OF LEFT LEG WOUND WITH ABRA PLACEMENT AND PLACEMENT OF WOUND VAC;  Surgeon: Wallace Going, DO;  Location: [facility];  Service: Plastics;  Laterality: Left;  . KNEE SURGERY      Family History  Problem Relation Age of Onset  . Heart disease Mother   . Stroke Mother   . Heart disease Father   . Stroke Father     Social History  Substance Use Topics  . Smoking status: Former Smoker    Packs/day: 0.50    Years: 25.00    Types: Cigarettes  . Smokeless tobacco: Never Used  . Alcohol use No    Prior to Admission medications   Medication Sig Start Date End Date Taking? Authorizing Provider  acetaminophen (TYLENOL) 325 MG  tablet Take 2 tablets (650 mg total) by mouth every 6 (six) hours as needed for mild pain (or Fever >/= 101). 01/31/17  Yes Theodis Blaze, MD  amLODipine (NORVASC) 10 MG tablet Take 10 mg by mouth every morning.   Yes [provider]  ARIPiprazole (ABILIFY) 20 MG tablet Take 20 mg by mouth daily.   Yes [provider]  Ascorbic Acid (VITAMIN C) 100 MG tablet Take 500 mg by mouth every morning.   Yes [provider]  aspirin EC 81 MG tablet Take 1 tablet (81 mg total) by mouth daily. Patient taking differently: Take 81 mg by mouth every morning.  09/17/16  Yes Jerline Pain, MD  atenolol (TENORMIN) 50  MG tablet Take 25 mg by mouth every morning.  09/30/16  Yes [provider]  clonazePAM (KLONOPIN) 1 MG tablet Take 1 tablet (1 mg total) by mouth 2 (two) times daily as needed for anxiety. Patient taking differently: Take 1 mg by mouth 2 (two) times daily.  11/08/16  Yes Lavina Hamman, MD  doxycycline (VIBRA-TABS) 100 MG tablet Take 100 mg by mouth 2 (two) times daily.   Yes [provider]  FLUoxetine (PROZAC) 20 MG capsule Take 1 capsule (20 mg total) by mouth daily. Patient taking differently: Take 20 mg by mouth every morning.  11/09/16  Yes Lavina Hamman, MD  guaiFENesin-codeine [facility]) 100-10 MG/5ML syrup Take 5 mLs by mouth 3 (three) times daily as needed for cough.   Yes [provider]  lamoTRIgine (LAMICTAL) 200 MG tablet Take 200 mg by mouth every morning.   Yes [provider]  Multiple Vitamin (MULTIVITAMIN) capsule Take 1 capsule by mouth every morning.   Yes [provider]  oxyCODONE (OXYCONTIN) 40 mg 12 hr tablet Take 40 mg by mouth every 8 (eight) hours as needed (pain).  01/15/17  Yes [provider]  predniSONE (DELTASONE) 50 MG tablet Take 50 mg by mouth daily with breakfast.   Yes [provider]  PROAIR HFA 108 (90 Base) MCG/ACT inhaler Inhale 2 puffs into the lungs every 6 (six) hours as needed for wheezing or shortness of breath.  12/27/16  Yes [provider]  RAPAFLO 8 MG CAPS capsule Take 8 mg by mouth daily with breakfast.  11/20/15  Yes [provider]  zinc gluconate 50 MG tablet Take 250 mg by mouth every morning.   Yes [provider]  sildenafil (VIAGRA) 50 MG tablet Take 50 mg by mouth as needed for erectile dysfunction.  01/20/17   [provider]    Allergies Propoxyphene; Amoxicillin; Ciprofloxacin; and Depakote [divalproex sodium]   REVIEW OF SYSTEMS  Negative except as noted here or in the History of Present Illness.   PHYSICAL EXAMINATION  Initial  Vital Signs Blood pressure (!) 136/119, pulse (!) 122, temperature (!) 100.4 F (38 C), temperature source Oral, resp. rate (!) 22, SpO2 (!) 74 %.  Examination General: Well-developed, well-nourished male in no acute distress; appearance consistent with age of record HENT: normocephalic; atraumatic Eyes: pupils equal, round and reactive to light; extraocular muscles intact Neck: supple Heart: regular rate and rhythm; Tachycardia Lungs: clear to auscultation bilaterally; tachypnea Abdomen: soft; nondistended; nontender; no masses or hepatosplenomegaly; bowel sounds present Extremities: No deformity; full range of motion; pulses normal; left ankle bandaged Neurologic: Awake, alert and oriented; motor function intact in all extremities and symmetric; no facial droop; tremulous Skin: Warm and dry Psychiatric: Normal mood and affect  RESULTS  Summary of this visit's results, reviewed by myself:   EKG Interpretation  Date/Time:  Tuesday May 27 2017 03:48:01 EDT Ventricular Rate:  119 PR Interval:    QRS Duration: 98 QT Interval:  324 QTC Calculation: 456 R Axis:   -16 Text Interpretation:  Sinus tachycardia LVH with secondary repolarization abnormality Confirmed by Jream Broyles, Jenny Reichmann [phone]) on 05/27/2017 4:30:30 AM      Laboratory Studies: Results for orders placed or performed during the hospital encounter of 05/27/17 (from the past 24 hour(s))  CBC with Differential/Platelet     Status: Abnormal   Collection Time: 05/27/17  4:19 AM  Result Value Ref Range   WBC 8.0 4.0 - 10.5 K/uL   RBC 4.43 4.22 - 5.81 MIL/uL   Hemoglobin 12.5 (L) 13.0 - 17.0 g/dL   HCT 36.2 (L) 39.0 - 52.0 %   MCV 81.7 78.0 - 100.0 fL   MCH 28.2 26.0 - 34.0 pg   MCHC 34.5 30.0 - 36.0 g/dL   RDW 13.9 11.5 - 15.5 %   Platelets 255 150 - 400 K/uL   Neutrophils Relative % 93 %   Neutro Abs 7.4 1.7 - 7.7 K/uL   Lymphocytes Relative 5 %   Lymphs Abs 0.4 (L) 0.7 - 4.0 K/uL   Monocytes Relative 2 %    Monocytes Absolute 0.2 0.1 - 1.0 K/uL   Eosinophils Relative 0 %   Eosinophils Absolute 0.0 0.0 - 0.7 K/uL   Basophils Relative 0 %   Basophils Absolute 0.0 0.0 - 0.1 K/uL  Comprehensive metabolic panel     Status: Abnormal   Collection Time: 05/27/17  4:19 AM  Result Value Ref Range   Sodium 134 (L) 135 - 145 mmol/L   Potassium 3.4 (L) 3.5 - 5.1 mmol/L   Chloride 100 (L) 101 - 111 mmol/L   CO2 22 22 - 32 mmol/L   Glucose, Bld 121 (H) 65 - 99 mg/dL   BUN 25 (H) 6 - 20 mg/dL   Creatinine, Ser 1.40 (H) 0.61 - 1.24 mg/dL   Calcium 9.8 8.9 - 10.3 mg/dL   Total Protein 7.3 6.5 - 8.1 g/dL   Albumin 3.8 3.5 - 5.0 g/dL   AST 49 (H) 15 - 41 U/L   ALT 15 (L) 17 - 63 U/L   Alkaline Phosphatase 49 38 - 126 U/L   Total Bilirubin 0.7 0.3 - 1.2 mg/dL   GFR calc non Af Amer 51 (L) >60 mL/min   GFR calc Af Amer 59 (L) >60 mL/min   Anion gap 12 5 - 15  I-Stat CG4 Lactic Acid, ED     Status: Abnormal   Collection Time: 05/27/17  4:29 AM  Result Value Ref Range   Lactic Acid, Venous 3.60 (HH) 0.5 - 1.9 mmol/L   Comment NOTIFIED PHYSICIAN    Imaging Studies: Dg Chest 2 View  Result Date: 05/27/2017 CLINICAL DATA:  Anxiety and shortness of breath. History of hypertension. COPD. EXAM: CHEST  2 VIEW COMPARISON:  09/27/2016 FINDINGS: Normal heart size and pulmonary vascularity. Bilateral perihilar airspace infiltrates, greater on the right. These are new since previous study in could indicate edema, pneumonia, or aspiration depending on the clinical setting. No blunting of costophrenic angles. No pneumothorax. Mediastinal contours appear intact. Calcification of the aorta. IMPRESSION: Bilateral perihilar infiltrates may indicate edema, pneumonia, or aspiration. Electronically Signed   By: Lucienne Capers M.D.   On: 05/27/2017 05:17    ED COURSE  Nursing notes and initial vitals signs,  including pulse oximetry, reviewed.  Vitals:   05/27/17 0338 05/27/17 0423 05/27/17 0435  BP: (!) 136/119 (!)  160/75   Pulse: (!) 122 (!) 119   Resp: (!) 22 (!) 21   Temp: (!) 100.4 F (38 C) 98.3 F (36.8 C) (!) 101.7 F (38.7 C)  TempSrc: Oral Oral Oral  SpO2: (!) 74% (!) 83%    4:45 AM Sepsis protocol initiated. IV fluid bolus initiated.  5:41 AM IV Rocephin and Zithromax ordered for what appears to be multifocal pneumonia.   PROCEDURES   CRITICAL CARE Performed by: Shanon Rosser L Total critical care time: 30 minutes Critical care time was exclusive of separately billable procedures and treating other patients. Critical care was necessary to treat or prevent imminent or life-threatening deterioration. Critical care was time spent personally by me on the following activities: development of treatment plan with patient and/or surrogate as well as nursing, discussions with consultants, evaluation of patient's response to treatment, examination of patient, obtaining history from patient or surrogate, ordering and performing treatments and interventions, ordering and review of laboratory studies, ordering and review of radiographic studies, pulse oximetry and re-evaluation of patient's condition.   ED DIAGNOSES     ICD-10-CM   1. SIRS (systemic inflammatory response syndrome) (HCC) R65.10   2. Multifocal pneumonia J18.9        Amon Costilla, Jenny Reichmann, MD 05/27/17 [phone]

## 2017-05-27 NOTE — ED Notes (Signed)
Bed: WTR8 Expected date:  Expected time:  Means of arrival:  Comments: 

## 2017-05-27 NOTE — H&P (Addendum)
History and Physical    Travis Salinas:[id number] DOB: [birth year]  DOA: 05/27/2017 PCP: Kathyrn Lass, MD  Patient coming from: Home   Chief Complaint: SOB and cough   HPI: Travis Salinas is a 66 y.o. male with medical history significant of hypertension, COPD, depression/anxiety and BPH who presented to the emergency department complaining of progressive worsening shortness of breath for the past 4 days. Patient was seen by his PCP 4 days ago and was diagnosed with COPD exacerbation. She was placed on doxycycline, prednisone and cough syrup. The morning of admission breathing got acutely worse and he developed fevers and chills. Other associated symptoms include pleuritic chest pain, weakness, palpitations and abdominal pain. Patient denies sick contact. Reported use medications as prescribed also using his inhaler without symptoms relief.  ED Course: ED found to be tachypneic, tachycardic, febrile and hypoxic with oxygen saturation in the high 70s. Temperature 101.7. Lab workup shows creatinine of 1.4 and chest x-ray with bilateral infiltrates. Patient was given Rocephin and azithromycin.   Review of Systems:   General:  positive for fever/chills and weakness  HEENT: no blurry vision, hearing changes or sore throat Respiratory:  see history of present illness, positive for productive cough  CV: Positive for pleuritic chest pain, palpitation  GI: no nausea, vomiting, diarrhea, constipation GU: no dysuria, burning on urination, increased urinary frequency, hematuria  Ext:. No deformities Neuro: no unilateral weakness, numbness, or tingling, no vision change or hearing loss Skin: No rashes, lesions or wounds. MSK: No muscle spasm, no deformity, no limitation of range of movement in spin Heme: No easy bruising.  Travel history: No recent long distant travel.   Past Medical History:  Diagnosis Date  . Arthritis   . Cervical disc herniation   . COPD (chronic obstructive  pulmonary disease) ([place])   . Headache    " due to antibiotics"  . Hepatitis C   . Hypertension     Past Surgical History:  Procedure Laterality Date  . APPLICATION OF A-CELL OF EXTREMITY Left 12/05/2016   Procedure: APPLICATION OF A-CELL OF EXTREMITY;  Surgeon: Loel Lofty Dillingham, DO;  Location: [place];  Service: Plastics;  Laterality: Left;  . APPLICATION OF WOUND VAC Left 12/05/2016   Procedure: APPLICATION OF WOUND VAC;  Surgeon: Loel Lofty Dillingham, DO;  Location: [place];  Service: Plastics;  Laterality: Left;  . I&D EXTREMITY Left 11/03/2016   Procedure: IRRIGATION AND DEBRIDEMENT EXTREMITY;  Surgeon: Leandrew Koyanagi, MD;  Location: [place];  Service: Orthopedics;  Laterality: Left;  . I&D EXTREMITY Left 12/05/2016   Procedure: IRRIGATION AND DEBRIDEMENT EXTREMITY;  Surgeon: Loel Lofty Dillingham, DO;  Location: [place];  Service: Plastics;  Laterality: Left;  . INCISION AND DRAINAGE ABSCESS Right 11/12/2013   Procedure: INCISION AND DRAINAGE AND OPEN PACKING OF RIGHT CALF  ABSCESS;  Surgeon: Earnstine Regal, MD;  Location: [facility];  Service: General;  Laterality: Right;  . INCISION AND DRAINAGE OF WOUND Left 01/29/2017   Procedure: IRRIGATION AND DEBRIDEMENT OF LEFT LEG WOUND WITH ABRA PLACEMENT AND PLACEMENT OF WOUND VAC;  Surgeon: Wallace Going, DO;  Location: [facility];  Service: Plastics;  Laterality: Left;  . KNEE SURGERY       reports that he has quit smoking. His smoking use included Cigarettes. He has a 12.50 pack-year smoking history. He has never used smokeless tobacco. He reports that he uses drugs, including Heroin and Cocaine. He reports that he does not drink alcohol.  Allergies  Allergen Reactions  . Propoxyphene Nausea And Vomiting  . Amoxicillin Nausea And Vomiting  . Ciprofloxacin Nausea Only  . Depakote [Divalproex Sodium] Other (See Comments)    hallucinations    Family History  Problem Relation Age of Onset  . Heart disease Mother   . Stroke Mother   . Heart disease  Father   . Stroke Father     Prior to Admission medications   Medication Sig Start Date End Date Taking? Authorizing Provider  acetaminophen (TYLENOL) 325 MG tablet Take 2 tablets (650 mg total) by mouth every 6 (six) hours as needed for mild pain (or Fever >/= 101). 01/31/17  Yes Theodis Blaze, MD  amLODipine (NORVASC) 10 MG tablet Take 10 mg by mouth every morning.   Yes [provider]  ARIPiprazole (ABILIFY) 20 MG tablet Take 20 mg by mouth daily.   Yes [provider]  Ascorbic Acid (VITAMIN C) 100 MG tablet Take 500 mg by mouth every morning.   Yes [provider]  aspirin EC 81 MG tablet Take 1 tablet (81 mg total) by mouth daily. Patient taking differently: Take 81 mg by mouth every morning.  09/17/16  Yes Jerline Pain, MD  atenolol (TENORMIN) 50 MG tablet Take 25 mg by mouth every morning.  09/30/16  Yes [provider]  clonazePAM (KLONOPIN) 1 MG tablet Take 1 tablet (1 mg total) by mouth 2 (two) times daily as needed for anxiety. Patient taking differently: Take 1 mg by mouth 2 (two) times daily.  11/08/16  Yes Lavina Hamman, MD  doxycycline (VIBRA-TABS) 100 MG tablet Take 100 mg by mouth 2 (two) times daily.   Yes [provider]  FLUoxetine (PROZAC) 20 MG capsule Take 1 capsule (20 mg total) by mouth daily. Patient taking differently: Take 20 mg by mouth every morning.  11/09/16  Yes Lavina Hamman, MD  guaiFENesin-codeine [facility]) 100-10 MG/5ML syrup Take 5 mLs by mouth 3 (three) times daily as needed for cough.   Yes [provider]  lamoTRIgine (LAMICTAL) 200 MG tablet Take 200 mg by mouth every morning.   Yes [provider]  Multiple Vitamin (MULTIVITAMIN) capsule Take 1 capsule by mouth every morning.   Yes [provider]  oxyCODONE (OXYCONTIN) 40 mg 12 hr tablet Take 40 mg by mouth every 8 (eight) hours as needed (pain).  01/15/17  Yes [provider]  predniSONE (DELTASONE) 50 MG tablet  Take 50 mg by mouth daily with breakfast.   Yes [provider]  PROAIR HFA 108 (90 Base) MCG/ACT inhaler Inhale 2 puffs into the lungs every 6 (six) hours as needed for wheezing or shortness of breath.  12/27/16  Yes [provider]  RAPAFLO 8 MG CAPS capsule Take 8 mg by mouth daily with breakfast.  11/20/15  Yes [provider]  zinc gluconate 50 MG tablet Take 250 mg by mouth every morning.   Yes [provider]  sildenafil (VIAGRA) 50 MG tablet Take 50 mg by mouth as needed for erectile dysfunction.  01/20/17   [provider]    Physical Exam: Vitals:   05/27/17 0338 05/27/17 0423 05/27/17 0435 05/27/17 0800  BP: (!) 136/119 (!) 160/75  122/65  Pulse: (!) 122 (!) 119  94  Resp: (!) 22 (!) 21  (!) 27  Temp: (!) 100.4 F (38 C) 98.3 F (36.8 C) (!) 101.7 F (38.7 C)   TempSrc: Oral Oral Oral   SpO2: (!) 74% [facility] Kitchen)  83%  91%     Constitutional: NAD, on 4 L nasal cannula Eyes: PERRL, lids and conjunctivae normal ENMT: Mucous membranes are dry. Posterior pharynx clear of any exudate or lesions. Neck: normal, supple, no masses, no thyromegaly Respiratory: Decreased air entry at bilateral bases, left lower lobe rales. No wheezing or crackles, tender to palpation on left chest.  Cardiovascular: Regular rate and rhythm, no murmurs / rubs / gallops. No extremity edema. 2+ pedal pulses.  Abdomen: no tenderness, no masses palpated. No hepatosplenomegaly. Bowel sounds positive.  Musculoskeletal: no clubbing / cyanosis. No joint deformity upper and lower extremities.  Skin: no rashes, lesions, ulcers. No induration Neurologic: CN 2-12 grossly intact. Strength 5/5 in all 4.  Psychiatric: Normal judgment and insight. Alert and oriented x 3. Normal mood.   Labs on Admission: I have personally reviewed following labs and imaging studies  CBC:  Recent Labs Lab 05/27/17 0419  WBC 8.0  NEUTROABS 7.4  HGB 12.5*  HCT 36.2*  MCV 81.7  PLT 621    Basic Metabolic Panel:  Recent Labs Lab 05/27/17 0419  NA 134*  K 3.4*  CL 100*  CO2 22  GLUCOSE 121*  BUN 25*  CREATININE 1.40*  CALCIUM 9.8   GFR: CrCl cannot be calculated (Unknown ideal weight.). Liver Function Tests:  Recent Labs Lab 05/27/17 0419  AST 49*  ALT 15*  ALKPHOS 49  BILITOT 0.7  PROT 7.3  ALBUMIN 3.8   No results for input(s): LIPASE, AMYLASE in the last 168 hours. No results for input(s): AMMONIA in the last 168 hours. Coagulation Profile: No results for input(s): INR, PROTIME in the last 168 hours. Cardiac Enzymes: No results for input(s): CKTOTAL, CKMB, CKMBINDEX, TROPONINI in the last 168 hours. BNP (last 3 results) No results for input(s): PROBNP in the last 8760 hours. HbA1C: No results for input(s): HGBA1C in the last 72 hours. CBG: No results for input(s): GLUCAP in the last 168 hours. Lipid Profile: No results for input(s): CHOL, HDL, LDLCALC, TRIG, CHOLHDL, LDLDIRECT in the last 72 hours. Thyroid Function Tests: No results for input(s): TSH, T4TOTAL, FREET4, T3FREE, THYROIDAB in the last 72 hours. Anemia Panel: No results for input(s): VITAMINB12, FOLATE, FERRITIN, TIBC, IRON, RETICCTPCT in the last 72 hours. Urine analysis:    Component Value Date/Time   COLORURINE YELLOW 01/26/2017 0800   APPEARANCEUR CLEAR 01/26/2017 0800   LABSPEC 1.017 01/26/2017 0800   PHURINE 5.0 01/26/2017 0800   GLUCOSEU NEGATIVE 01/26/2017 0800   HGBUR NEGATIVE 01/26/2017 0800   BILIRUBINUR NEGATIVE 01/26/2017 0800   KETONESUR NEGATIVE 01/26/2017 0800   PROTEINUR NEGATIVE 01/26/2017 0800   UROBILINOGEN 1.0 11/14/2013 1424   NITRITE NEGATIVE 01/26/2017 0800   LEUKOCYTESUR NEGATIVE 01/26/2017 0800   Sepsis Labs: !!!!!!!!!!!!!!!!!!!!!!!!!!!!!!!!!!!!!!!!!!!! @LABRCNTIP (procalcitonin:4,lacticidven:4) )No results found for this or any previous visit (from the past 240 hour(s)).   Radiological Exams on Admission: Dg Chest 2 View  Result Date:  05/27/2017 CLINICAL DATA:  Anxiety and shortness of breath. History of hypertension. COPD. EXAM: CHEST  2 VIEW COMPARISON:  09/27/2016 FINDINGS: Normal heart size and pulmonary vascularity. Bilateral perihilar airspace infiltrates, greater on the right. These are new since previous study in could indicate edema, pneumonia, or aspiration depending on the clinical setting. No blunting of costophrenic angles. No pneumothorax. Mediastinal contours appear intact. Calcification of the aorta. IMPRESSION: Bilateral perihilar infiltrates may indicate edema, pneumonia, or aspiration. Electronically Signed   By: Lucienne Capers M.D.   On: 05/27/2017 05:17    EKG: Independently reviewed. Sinus  tachycardia @ 119 BPM, No ST changes   Assessment/Plan Severe Sepsis secondary to Acute respiratory failure with hypoxia due to community-acquired pneumonia Meets sepsis criteria with fever, tachycardia and source of infection. Admit to telemetry ABG shows mild respiratory alkalosis, as patient is having chest pain and requiring high flows of oxygen will need to rule out PE. - Will order d-dimer - Addendum d-dime positive, VQ scan ordered   Agree with empiric abx - cont rocephin and azythro  Get Procalcitonin - adjust abx therapy pending results  Follow up blood cultures  Continue supportive treatment with Xopenex, Tessalon pearls, Tylenol and IV fluids  Repeat CXR after hydration  Trend Lactic acid   COPD  Unlikely to be an exacerbation  Patient was on prednisone PTA - will continue for 3 more days - effective also on PNA tx  Nebs q 6 hrs  O2 supplement - wean as tolerated   AKI on CKD stage 2  Likely prerenal from sepsis physiology  Cr on admission 1.40 Continue IVF  Check BMP in AM   HTN  BP stable  Resume home medications - Atenolol and Norvasc  Continue to monitor   Hypokalemia  Replete  Check in AM   Depression and anxiety  No SI/HI  Continue home medications - Abilify, Lamotrigine, Prozac  and Klonopin   BPH  Asymptomatic  Continue Rapaflo   Polysubstance abuse  UDS positive for cocaine  Also on chronic opioids for back pain - Oxy resume to avoid withdrawal  Counseled on cessation   DVT prophylaxis: Lovenox  Code Status: FULL  Family Communication: None at bedside  Disposition Plan: Anticipate discharge to previous home environment.  Consults called: None  Admission status: Inpatient/Tele    Chipper Oman MD [facility] Pager: Text Page via [email or web address]  [phone]  If 7PM-7AM, please contact night-coverage [email or web address] Password [email or web address]  05/27/2017, 9:05 AM

## 2017-05-27 NOTE — Consult Note (Addendum)
Name: Travis Salinas MRN: [id number] DOB: June 19, 1951    ADMISSION DATE:  05/27/2017 CONSULTATION DATE:  05/27/2017  REFERRING MD :  Dr. Patrecia Pour   CHIEF COMPLAINT:  Hypoxia   HISTORY OF PRESENT ILLNESS:   66 year old male with PMH of COPD, Hep C, HTN, Polysubstance Abuse +Cocaine   Presents to ED on 9/18 with progressive dyspnea for the last 4 days. Patient was seen by PCP four days ago and diagnosed with COPD exacerbation. Placed on doxycycline, prednisone, and cough syrup. However, over the last few days developed fever, chills, and weakness. Upon arrival to ED with tachypneic, tachycardiac, febrile (101.7), and hypoxic. CXR with bilateral infiltrates. PCCM consulted for progressive hypoxia.   SIGNIFICANT EVENTS  9/18 > Presents to ED with dyspnea   STUDIES:  CXR 9/18 > Bilateral perihilar infiltrates may indicate edema, pneumonia, or aspiration. NM 9/18 > Very low probability for pulmonary embolism (0 - 9%). CXR 9/18 > Increasing bilateral infiltrates, left greater than right, most consistent with the multifocal infectious process. Non-cardiogenic edema is a possibility. The heart, hila, and mediastinum are otherwise unchanged and unremarkable.  PAST MEDICAL HISTORY :   has a past medical history of Arthritis; Cervical disc herniation; COPD (chronic obstructive pulmonary disease) ([place]); Headache; Hepatitis C; and Hypertension.  has a past surgical history that includes Incision and drainage abscess (Right, 11/12/2013); Knee surgery; I&D extremity (Left, 11/03/2016); I&D extremity (Left, 12/05/2016); Application of a-cell of extremity (Left, 12/05/2016); Application if wound vac (Left, 12/05/2016); and Incision and drainage of wound (Left, 01/29/2017). Prior to Admission medications   Medication Sig Start Date End Date Taking? Authorizing Provider  acetaminophen (TYLENOL) 325 MG tablet Take 2 tablets (650 mg total) by mouth every 6 (six) hours as needed for mild pain (or Fever >/=  101). 01/31/17  Yes Theodis Blaze, MD  amLODipine (NORVASC) 10 MG tablet Take 10 mg by mouth every morning.   Yes [provider]  ARIPiprazole (ABILIFY) 20 MG tablet Take 20 mg by mouth daily.   Yes [provider]  Ascorbic Acid (VITAMIN C) 100 MG tablet Take 500 mg by mouth every morning.   Yes [provider]  aspirin EC 81 MG tablet Take 1 tablet (81 mg total) by mouth daily. Patient taking differently: Take 81 mg by mouth every morning.  09/17/16  Yes Jerline Pain, MD  atenolol (TENORMIN) 50 MG tablet Take 25 mg by mouth every morning.  09/30/16  Yes [provider]  clonazePAM (KLONOPIN) 1 MG tablet Take 1 tablet (1 mg total) by mouth 2 (two) times daily as needed for anxiety. Patient taking differently: Take 1 mg by mouth 2 (two) times daily.  11/08/16  Yes Lavina Hamman, MD  doxycycline (VIBRA-TABS) 100 MG tablet Take 100 mg by mouth 2 (two) times daily.   Yes [provider]  FLUoxetine (PROZAC) 20 MG capsule Take 1 capsule (20 mg total) by mouth daily. Patient taking differently: Take 20 mg by mouth every morning.  11/09/16  Yes Lavina Hamman, MD  guaiFENesin-codeine [facility]) 100-10 MG/5ML syrup Take 5 mLs by mouth 3 (three) times daily as needed for cough.   Yes [provider]  lamoTRIgine (LAMICTAL) 200 MG tablet Take 200 mg by mouth every morning.   Yes [provider]  Multiple Vitamin (MULTIVITAMIN) capsule Take 1 capsule by mouth every morning.   Yes [provider]  oxyCODONE (OXYCONTIN) 40 mg 12 hr tablet Take 40 mg by  mouth every 8 (eight) hours as needed (pain).  01/15/17  Yes [provider]  predniSONE (DELTASONE) 50 MG tablet Take 50 mg by mouth daily with breakfast.   Yes [provider]  PROAIR HFA 108 (90 Base) MCG/ACT inhaler Inhale 2 puffs into the lungs every 6 (six) hours as needed for wheezing or shortness of breath.  12/27/16  Yes [provider]  RAPAFLO 8 MG CAPS  capsule Take 8 mg by mouth daily with breakfast.  11/20/15  Yes [provider]  zinc gluconate 50 MG tablet Take 250 mg by mouth every morning.   Yes [provider]  sildenafil (VIAGRA) 50 MG tablet Take 50 mg by mouth as needed for erectile dysfunction.  01/20/17   [provider]   Allergies  Allergen Reactions  . Propoxyphene Nausea And Vomiting  . Amoxicillin Nausea And Vomiting  . Ciprofloxacin Nausea Only  . Depakote [Divalproex Sodium] Other (See Comments)    hallucinations    FAMILY HISTORY:  family history includes Heart disease in his father and mother; Stroke in his father and mother. SOCIAL HISTORY:  reports that he has quit smoking. His smoking use included Cigarettes. He has a 12.50 pack-year smoking history. He has never used smokeless tobacco. He reports that he uses drugs, including Heroin and Cocaine. He reports that he does not drink alcohol.  REVIEW OF SYSTEMS:   All negative; except for those that are bolded, which indicate positives.  Constitutional: weight loss, weight gain, night sweats, fevers, chills, fatigue, weakness.  HEENT: headaches, sore throat, sneezing, nasal congestion, post nasal drip, difficulty swallowing, tooth/dental problems, visual complaints, visual changes, ear aches. Neuro: difficulty with speech, weakness, numbness, ataxia. CV:  chest pain, orthopnea, PND, swelling in lower extremities, dizziness, palpitations, syncope.  Resp: cough, hemoptysis, dyspnea, wheezing. GI: heartburn, indigestion, abdominal pain, nausea, vomiting, diarrhea, constipation, change in bowel habits, loss of appetite, hematemesis, melena, hematochezia.  GU: dysuria, change in color of urine, urgency or frequency, flank pain, hematuria. MSK: joint pain or swelling, decreased range of motion. Psych: change in mood or affect, depression, anxiety, suicidal ideations, homicidal ideations. Skin: rash, itching, bruising.  SUBJECTIVE:   VITAL  SIGNS: Temp:  [98 F (36.7 C)-101.7 F (38.7 C)] 98.8 F (37.1 C) (09/18 1939) Pulse Rate:  [91-122] 92 (09/18 1900) Resp:  [19-41] 25 (09/18 1900) BP: (117-162)/(11-119) 117/52 (09/18 1900) SpO2:  [71 %-96 %] 96 % (09/18 1900) FiO2 (%):  [80 %] 80 % (09/18 1953)  PHYSICAL EXAMINATION: General:  Adult male, no distress  Neuro:  Alert, oriented, follows commands  HEENT:  BiPAP in place Cardiovascular:  RRR, no MRG  Lungs:  Exp Wheeze, Diminished to bases, non-labored  Abdomen:  Active bowel sounds, non-tender  Musculoskeletal:  -edema  Skin:  Warm, dry, intact    Recent Labs Lab 05/27/17 0419  NA 134*  K 3.4*  CL 100*  CO2 22  BUN 25*  CREATININE 1.40*  GLUCOSE 121*    Recent Labs Lab 05/27/17 0419  HGB 12.5*  HCT 36.2*  WBC 8.0  PLT 255   Dg Chest 2 View  Result Date: 05/27/2017 CLINICAL DATA:  Anxiety and shortness of breath. History of hypertension. COPD. EXAM: CHEST  2 VIEW COMPARISON:  09/27/2016 FINDINGS: Normal heart size and pulmonary vascularity. Bilateral perihilar airspace infiltrates, greater on the right. These are new since previous study in could indicate edema, pneumonia, or aspiration depending on the clinical setting. No blunting of costophrenic angles. No pneumothorax. Mediastinal contours  appear intact. Calcification of the aorta. IMPRESSION: Bilateral perihilar infiltrates may indicate edema, pneumonia, or aspiration. Electronically Signed   By: Lucienne Capers M.D.   On: 05/27/2017 05:17   Nm Pulmonary Perf And Vent  Result Date: 05/27/2017 CLINICAL DATA:  Chest pain. Dyspnea. COPD. Former smoker. Elevated D-dimer. EXAM: NUCLEAR MEDICINE VENTILATION - PERFUSION LUNG SCAN TECHNIQUE: Ventilation images were obtained in multiple projections using inhaled aerosol Tc-90m DTPA. Perfusion images were obtained in multiple projections after intravenous injection of Tc-2m MAA. RADIOPHARMACEUTICALS:  29.9 mCi Technetium-81m DTPA aerosol inhalation and 3.8  mCi Technetium-76m MAA IV COMPARISON:  Chest radiograph from earlier today. FINDINGS: Ventilation: Heterogeneous radiotracer uptake throughout the lungs with central airway accumulation, compatible with airways disease. Perfusion: Mildly heterogeneous perfusion to the left lower lobe, with the tiny regions of decreased perfusion less than the corresponding opacity on chest radiograph. No wedge-shaped peripheral perfusion defects. IMPRESSION: Very low probability for pulmonary embolism (0 - 9%). Electronically Signed   By: Ilona Sorrel M.D.   On: 05/27/2017 16:46   Dg Chest Port 1 View  Result Date: 05/27/2017 CLINICAL DATA:  Increased shortness of breath. EXAM: PORTABLE CHEST 1 VIEW COMPARISON:  May 27, 2017 FINDINGS: Increasing bilateral infiltrates, left greater than right, most consistent with the multifocal infectious process. Noncardiogenic edema is a possibility. The heart, hila, and mediastinum are otherwise unchanged and unremarkable. IMPRESSION: Worsening bilateral pulmonary infiltrates as above. Electronically Signed   By: Dorise Bullion III M.D   On: 05/27/2017 18:25    ASSESSMENT / PLAN:  Acute Hypoxic Respiratory Failure in setting of multilobar PNA +/-AECOPD  ?Hypersensity Pneumonitis in setting of cocaine use  -VQ scan negative for PE  Plan  -Monitor for need for intubation  -BiPAP PRN -Maintain Oxygen Saturation >92  -Trend ABG  > will obtain at midnight  -Trend CXR  -Change Prednisone to Solu-medrol  -Continue tessalon and mucinex dm  -Obtain CT Chest W/O Contrast  -Scheduled Atrovent and Xopenex   Septic Shock secondary to CAP -Flu negative  -PCT 3.97 >  -LA 2.34 >  Plan  -Trend Lactic Acid and PCT  -Continue Antibiotics (Azithromycin and Zosyn)  -RVP pending   -Follow culture data   Acute on Chronic KD stage 2 Plan  -Trend BMP  -Replace electrolytes as indicated  -NS @ 100 ml/hr   Polysubstance Abuse  -UDS positive for cocaine and opiates  Plan    -Monitor for withdrawal  -Counseling/Education   Hayden Pedro, AGACNP-BC [facility]  Pgr: [phone]  PCCM Pgr: [phone]  STAFF NOTE  I, Dr Seward Carol have personally reviewed patient's available data, including medical history, events of note, physical examination and test results as part of my evaluation. I have discussed with resident/NP and other care providers such as pharmacist, RN and RRT.  In addition,  I personally evaluated patient  66 yr old male with a PMHx of COPD , Chronic Hep C , HTN, CKD II/III h/o LLE cellulitis previously debrided treated with oral antibiotics  and polysubstance abuse presents after being seen by PCP for possible Acute exacerbation of COPD started on doxycycline, prednisone and cough syrup.  Now with worsening of symptoms Sat 70% started on NIPPV IPAP 20 EPAP 6 during my evaluation and latest ABG 7.44/29/48/19 On FiO2 100% CXR shows bilateral infiltrates in multiple lung fields L>R + crackles and wheezing on exam +egophony on left upper.  Patient is high risk for intubation. Poor PF ratio explained to patient that he may be  intubated due to the severity of his illness he and his wife understand risks/benefits.  CT scan of chest . Pt also positive for cocaine-> Acute hypoxic respiratory failure/ARDS secondary to AECOPD/ Hypersensitivity Pneumonitis/ Multilobar PNA  negative for influenza , pending RVP, elevated PCT. started on empiric antibiotics (Zosyn and Azithromycin) started on Solumedrol IV 60 Q 6, bronchodilators and pulm toilet. CT Chest. AKI on CKD on IVF trend Creat. Replace necessary electrolytes and avoid nephrotoxic meds.    Rest per NP whose note is outlined above and that I agree with  The patient is critically ill with multiple organ systems failure and requires high complexity decision making for assessment and support, frequent evaluation and titration of therapies, application of advanced monitoring  technologies and extensive interpretation of multiple databases. Critical Care Time devoted to patient care services described in this note is 35 Minutes. This time reflects time of care of this signee Dr Seward Carol. This critical care time does not reflect procedure time, or teaching time or supervisory time of PA/NP/Med student/Med Resident etc but could involve care discussion time   Disposition: [facility] Family: wife Code status : Full Critical care Time 35 mins  Dr. Seward Carol [facility]  05/27/2017 10:03 PM

## 2017-05-27 NOTE — Progress Notes (Signed)
PHARMACY NOTE -  [facility] has been consulted for dosing of Rocephin for CAP. Rocephin 1gm IV q24h has been placed and need for further dosage adjustment appears unlikely at present.    Will sign off at this time.  Please reconsult if a change in clinical status warrants re-evaluation of dosage.  Netta Cedars, PharmD, BCPS 05/27/2017@8 :14 AM

## 2017-05-27 NOTE — Progress Notes (Signed)
[facility] Progress Note Patient Name: Travis Salinas DOB: July 07, 1951 MRN: [id number]   Date of Service  05/27/2017  HPI/Events of Note  Patient transferred from floor d/t hypoxia with sat down to 82% on NRBM. CXR reveal multilobar pneumonia. L lung and RLL/RML. PCCM hasn't been consulted yet. However, it appears that PCCM will be consulted   eICU Interventions  Will order: 1. BIPAP - IPAP - 12 and EPAP - 5. Keep sat >= 93%.  2. Monitor closely for need for intubation and mechanical ventilation.      Intervention Category Major Interventions: Hypoxemia - evaluation and management  Lysle Dingwall 05/27/2017, 6:27 PM

## 2017-05-28 DIAGNOSIS — R7881 Bacteremia: Secondary | ICD-10-CM | POA: Diagnosis present

## 2017-05-28 DIAGNOSIS — R6510 Systemic inflammatory response syndrome (SIRS) of non-infectious origin without acute organ dysfunction: Secondary | ICD-10-CM | POA: Insufficient documentation

## 2017-05-28 DIAGNOSIS — J9601 Acute respiratory failure with hypoxia: Secondary | ICD-10-CM

## 2017-05-28 DIAGNOSIS — R0902 Hypoxemia: Secondary | ICD-10-CM | POA: Insufficient documentation

## 2017-05-28 LAB — BLOOD CULTURE ID PANEL (REFLEXED)
Acinetobacter baumannii: NOT DETECTED
CANDIDA GLABRATA: NOT DETECTED
CANDIDA TROPICALIS: NOT DETECTED
Candida albicans: NOT DETECTED
Candida krusei: NOT DETECTED
Candida parapsilosis: NOT DETECTED
Carbapenem resistance: NOT DETECTED
ENTEROCOCCUS SPECIES: NOT DETECTED
ESCHERICHIA COLI: NOT DETECTED
Enterobacter cloacae complex: NOT DETECTED
Enterobacteriaceae species: NOT DETECTED
HAEMOPHILUS INFLUENZAE: NOT DETECTED
KLEBSIELLA PNEUMONIAE: NOT DETECTED
Klebsiella oxytoca: NOT DETECTED
LISTERIA MONOCYTOGENES: NOT DETECTED
METHICILLIN RESISTANCE: NOT DETECTED
Neisseria meningitidis: NOT DETECTED
PROTEUS SPECIES: NOT DETECTED
Pseudomonas aeruginosa: NOT DETECTED
SERRATIA MARCESCENS: NOT DETECTED
STREPTOCOCCUS PYOGENES: NOT DETECTED
Staphylococcus aureus (BCID): NOT DETECTED
Staphylococcus species: NOT DETECTED
Streptococcus agalactiae: NOT DETECTED
Streptococcus pneumoniae: DETECTED — AB
Streptococcus species: DETECTED — AB
Vancomycin resistance: NOT DETECTED

## 2017-05-28 LAB — CBC
HCT: 30.8 % — ABNORMAL LOW (ref 39.0–52.0)
HEMOGLOBIN: 10.5 g/dL — AB (ref 13.0–17.0)
MCH: 27.9 pg (ref 26.0–34.0)
MCHC: 34.1 g/dL (ref 30.0–36.0)
MCV: 81.9 fL (ref 78.0–100.0)
Platelets: 216 10*3/uL (ref 150–400)
RBC: 3.76 MIL/uL — ABNORMAL LOW (ref 4.22–5.81)
RDW: 13.9 % (ref 11.5–15.5)
WBC: 6.7 10*3/uL (ref 4.0–10.5)

## 2017-05-28 LAB — BLOOD GAS, ARTERIAL
ACID-BASE DEFICIT: 1.3 mmol/L (ref 0.0–2.0)
BICARBONATE: 22.6 mmol/L (ref 20.0–28.0)
Delivery systems: POSITIVE
Drawn by: 225631
Expiratory PAP: 7
FIO2: 80
INSPIRATORY PAP: 14
LHR: 18 {breaths}/min
MECHANICAL RATE: 18
O2 Saturation: 89.1 %
PATIENT TEMPERATURE: 97.8
PO2 ART: 58.6 mmHg — AB (ref 83.0–108.0)
VT: 0.929 mL
pCO2 arterial: 36.4 mmHg (ref 32.0–48.0)
pH, Arterial: 7.408 (ref 7.350–7.450)

## 2017-05-28 LAB — COMPREHENSIVE METABOLIC PANEL
ALBUMIN: 2.7 g/dL — AB (ref 3.5–5.0)
ALK PHOS: 39 U/L (ref 38–126)
ALT: 12 U/L — AB (ref 17–63)
AST: 50 U/L — ABNORMAL HIGH (ref 15–41)
Anion gap: 9 (ref 5–15)
BUN: 25 mg/dL — ABNORMAL HIGH (ref 6–20)
CALCIUM: 8.2 mg/dL — AB (ref 8.9–10.3)
CO2: 22 mmol/L (ref 22–32)
CREATININE: 1.04 mg/dL (ref 0.61–1.24)
Chloride: 106 mmol/L (ref 101–111)
GFR calc Af Amer: >60 mL/min (ref >60)
GFR calc non Af Amer: >60 mL/min (ref >60)
Glucose, Bld: 140 mg/dL — ABNORMAL HIGH (ref 65–99)
Potassium: 3.7 mmol/L (ref 3.5–5.1)
SODIUM: 137 mmol/L (ref 135–145)
Total Bilirubin: 0.9 mg/dL (ref 0.3–1.2)
Total Protein: 5.8 g/dL — ABNORMAL LOW (ref 6.5–8.1)

## 2017-05-28 LAB — RESPIRATORY PANEL BY PCR
Adenovirus: NOT DETECTED
BORDETELLA PERTUSSIS-RVPCR: NOT DETECTED
CHLAMYDOPHILA PNEUMONIAE-RVPPCR: NOT DETECTED
CORONAVIRUS NL63-RVPPCR: NOT DETECTED
Coronavirus 229E: NOT DETECTED
Coronavirus HKU1: NOT DETECTED
Coronavirus OC43: NOT DETECTED
INFLUENZA A-RVPPCR: NOT DETECTED
Influenza A H1 2009: NOT DETECTED
Influenza A H1: NOT DETECTED
Influenza A H3: NOT DETECTED
Influenza B: NOT DETECTED
Metapneumovirus: NOT DETECTED
Mycoplasma pneumoniae: NOT DETECTED
PARAINFLUENZA VIRUS 3-RVPPCR: NOT DETECTED
PARAINFLUENZA VIRUS 4-RVPPCR: NOT DETECTED
Parainfluenza Virus 1: NOT DETECTED
Parainfluenza Virus 2: NOT DETECTED
RHINOVIRUS / ENTEROVIRUS - RVPPCR: DETECTED — AB
Respiratory Syncytial Virus: NOT DETECTED

## 2017-05-28 LAB — MAGNESIUM: MAGNESIUM: 1.9 mg/dL (ref 1.7–2.4)

## 2017-05-28 LAB — PHOSPHORUS: PHOSPHORUS: 3.1 mg/dL (ref 2.5–4.6)

## 2017-05-28 LAB — TROPONIN I: Troponin I: <0.03 ng/mL (ref <0.03)

## 2017-05-28 MED ORDER — ALBUTEROL SULFATE (2.5 MG/3ML) 0.083% IN NEBU
2.5000 mg | INHALATION_SOLUTION | RESPIRATORY_TRACT | Status: DC | PRN
  Administered 2017-06-02: 2.5 mg via RESPIRATORY_TRACT
  Filled 2017-05-28: qty 3

## 2017-05-28 MED ORDER — LORAZEPAM 2 MG/ML IJ SOLN
0.5000 mg | Freq: Once | INTRAMUSCULAR | Status: AC
  Administered 2017-05-28: 0.5 mg via INTRAVENOUS
  Filled 2017-05-28: qty 1

## 2017-05-28 MED ORDER — DEXTROSE 5 % IV SOLN
2.0000 g | INTRAVENOUS | Status: DC
  Administered 2017-05-28 – 2017-06-01 (×5): 2 g via INTRAVENOUS
  Filled 2017-05-28 (×6): qty 2

## 2017-05-28 MED ORDER — IPRATROPIUM-ALBUTEROL 0.5-2.5 (3) MG/3ML IN SOLN
3.0000 mL | Freq: Four times a day (QID) | RESPIRATORY_TRACT | Status: DC
  Administered 2017-05-28 – 2017-06-06 (×39): 3 mL via RESPIRATORY_TRACT
  Filled 2017-05-28 (×38): qty 3

## 2017-05-28 MED ORDER — MORPHINE SULFATE (PF) 4 MG/ML IV SOLN
2.0000 mg | Freq: Once | INTRAVENOUS | Status: AC
  Administered 2017-05-28: 2 mg via INTRAVENOUS
  Filled 2017-05-28: qty 1

## 2017-05-28 MED ORDER — ENSURE ENLIVE PO LIQD
237.0000 mL | Freq: Two times a day (BID) | ORAL | Status: DC
  Administered 2017-05-29 – 2017-06-12 (×13): 237 mL via ORAL

## 2017-05-28 MED ORDER — MORPHINE SULFATE (PF) 4 MG/ML IV SOLN
2.0000 mg | INTRAVENOUS | Status: DC | PRN
  Administered 2017-05-28 – 2017-05-29 (×5): 2 mg via INTRAVENOUS
  Filled 2017-05-28 (×5): qty 1

## 2017-05-28 NOTE — Progress Notes (Signed)
Rapid Response Event Note  Overview: called to patients room for low o2 saturations      Initial Focused Assessment: when arrived in room, pt on NRB mask at 15L. Lung sounds diminished bilaterally and patient reported that his breathing had improved since being placed on the NRB. 1715 VS- 98.2 F temp, RR 25, HR 123, 73% pulse ox on L hand.    Interventions: paged MD, who came to bedside for an evaluation.   Plan of Care (if not transferred): Will transfer to SDU  Event Summary:          Jaynie Bream

## 2017-05-28 NOTE — Progress Notes (Signed)
PHARMACY - PHYSICIAN COMMUNICATION CRITICAL VALUE ALERT - BLOOD CULTURE IDENTIFICATION (BCID)  Results for orders placed or performed during the hospital encounter of 05/27/17  Blood Culture ID Panel (Reflexed) (Collected: 05/27/2017  4:34 AM)  Result Value Ref Range   Enterococcus species NOT DETECTED NOT DETECTED   Vancomycin resistance NOT DETECTED NOT DETECTED   Listeria monocytogenes NOT DETECTED NOT DETECTED   Staphylococcus species NOT DETECTED NOT DETECTED   Staphylococcus aureus NOT DETECTED NOT DETECTED   Methicillin resistance NOT DETECTED NOT DETECTED   Streptococcus species DETECTED (A) NOT DETECTED   Streptococcus agalactiae NOT DETECTED NOT DETECTED   Streptococcus pneumoniae DETECTED (A) NOT DETECTED   Streptococcus pyogenes NOT DETECTED NOT DETECTED   Acinetobacter baumannii NOT DETECTED NOT DETECTED   Enterobacteriaceae species NOT DETECTED NOT DETECTED   Enterobacter cloacae complex NOT DETECTED NOT DETECTED   Escherichia coli NOT DETECTED NOT DETECTED   Klebsiella oxytoca NOT DETECTED NOT DETECTED   Klebsiella pneumoniae NOT DETECTED NOT DETECTED   Proteus species NOT DETECTED NOT DETECTED   Serratia marcescens NOT DETECTED NOT DETECTED   Carbapenem resistance NOT DETECTED NOT DETECTED   Haemophilus influenzae NOT DETECTED NOT DETECTED   Neisseria meningitidis NOT DETECTED NOT DETECTED   Pseudomonas aeruginosa NOT DETECTED NOT DETECTED   Candida albicans NOT DETECTED NOT DETECTED   Candida glabrata NOT DETECTED NOT DETECTED   Candida krusei NOT DETECTED NOT DETECTED   Candida parapsilosis NOT DETECTED NOT DETECTED   Candida tropicalis NOT DETECTED NOT DETECTED    Name of physician (or Provider) Contacted: Sood  Changes to prescribed antibiotics required: change Zosyn and Zithromax to ceftriaxone 2g IV q24h  Peggyann Juba, PharmD, BCPS Pager: [phone] 05/28/2017  8:52 AM

## 2017-05-28 NOTE — Progress Notes (Addendum)
Name: Travis Salinas MRN: [id number] DOB: Nov 15, 1950    ADMISSION DATE:  05/27/2017 CONSULTATION DATE:  05/27/2017  REFERRING MD :  Dr. Patrecia Pour   CHIEF COMPLAINT:  Hypoxia   HISTORY OF PRESENT ILLNESS:   66 year old male with PMH of COPD, Hep C, HTN, Polysubstance Abuse +Cocaine   Presents to ED on 9/18 with progressive dyspnea for the last 4 days. Patient was seen by PCP four days ago and diagnosed with COPD exacerbation. Placed on doxycycline, prednisone, and cough syrup. However, over the last few days developed fever, chills, and weakness. Upon arrival to ED with tachypneic, tachycardiac, febrile (101.7), and hypoxic. CXR with bilateral infiltrates. PCCM consulted for progressive hypoxia.   SUBJECTIVE:  Wants something to drink.  Feels that Bipap is helping.  VITAL SIGNS: Temp:  [97.8 F (36.6 C)-98.8 F (37.1 C)] 98 F (36.7 C) (09/19 0319) Pulse Rate:  [75-122] 79 (09/19 0523) Resp:  [19-41] 23 (09/19 0523) BP: (103-162)/(11-73) 119/54 (09/19 0400) SpO2:  [71 %-100 %] 100 % (09/19 0523) FiO2 (%):  [80 %-90 %] 90 % (09/19 0227)  PHYSICAL EXAMINATION:  General - alert, thin Eyes - pupils reactive ENT - Bipap mask on Cardiac - regular, no murmur Chest - faint b/l crackles, no wheeze Abd - soft, non tender Ext - no edema, decreased muscle bulk Skin - dressing Lt leg clean Neuro - normal strength   CMP Latest Ref Rng & Units 05/28/2017 05/27/2017 01/31/2017  Glucose 65 - 99 mg/dL 140(H) 121(H) 94  BUN 6 - 20 mg/dL 25(H) 25(H) 20  Creatinine 0.61 - 1.24 mg/dL 1.04 1.40(H) 1.20  Sodium 135 - 145 mmol/L 137 134(L) 140  Potassium 3.5 - 5.1 mmol/L 3.7 3.4(L) 3.9  Chloride 101 - 111 mmol/L 106 100(L) 105  CO2 22 - 32 mmol/L 22 22 28   Calcium 8.9 - 10.3 mg/dL 8.2(L) 9.8 9.5  Total Protein 6.5 - 8.1 g/dL 5.8(L) 7.3 -  Total Bilirubin 0.3 - 1.2 mg/dL 0.9 0.7 -  Alkaline Phos 38 - 126 U/L 39 49 -  AST 15 - 41 U/L 50(H) 49(H) -  ALT 17 - 63 U/L 12(L) 15(L) -     CBC Latest Ref Rng & Units 05/28/2017 05/27/2017 01/31/2017  WBC 4.0 - 10.5 K/uL 6.7 8.0 12.9(H)  Hemoglobin 13.0 - 17.0 g/dL 10.5(L) 12.5(L) 12.1(L)  Hematocrit 39.0 - 52.0 % 30.8(L) 36.2(L) 38.7(L)  Platelets 150 - 400 K/uL 216 255 330    ABG    Component Value Date/Time   PHART 7.408 05/28/2017 0015   PCO2ART 36.4 05/28/2017 0015   PO2ART 58.6 (L) 05/28/2017 0015   HCO3 22.6 05/28/2017 0015   TCO2 24 12/07/2015 1403   ACIDBASEDEF 1.3 05/28/2017 0015   O2SAT 89.1 05/28/2017 0015    CBG (last 3)  No results for input(s): GLUCAP in the last 72 hours.   Dg Chest 2 View  Result Date: 05/27/2017 CLINICAL DATA:  Anxiety and shortness of breath. History of hypertension. COPD. EXAM: CHEST  2 VIEW COMPARISON:  09/27/2016 FINDINGS: Normal heart size and pulmonary vascularity. Bilateral perihilar airspace infiltrates, greater on the right. These are new since previous study in could indicate edema, pneumonia, or aspiration depending on the clinical setting. No blunting of costophrenic angles. No pneumothorax. Mediastinal contours appear intact. Calcification of the aorta. IMPRESSION: Bilateral perihilar infiltrates may indicate edema, pneumonia, or aspiration. Electronically Signed   By: Lucienne Capers M.D.   On: 05/27/2017 05:17   Ct Chest Wo  Contrast  Result Date: 05/27/2017 CLINICAL DATA:  Hypoxia EXAM: CT CHEST WITHOUT CONTRAST TECHNIQUE: Multidetector CT imaging of the chest was performed following the standard protocol without IV contrast. COMPARISON:  Radiograph 05/27/2017, 06/04/2014 FINDINGS: Cardiovascular: Limited evaluation without intravenous contrast. Ectasia of the ascending segment without aneurysmal dilatation. Aortic atherosclerotic calcifications. Coronary artery calcifications. Heart size upper normal. No large pericardial effusion. Mediastinum/Nodes: Mild mediastinal adenopathy. Pretracheal lymph node measures 11 mm. A left paratracheal lymph node measures 9 mm in size.  Limited assessment for hilar adenopathy. Esophagus is within normal limits. Lungs/Pleura: Moderate emphysema. Partial consolidations within the bilateral lower lobes. Extensive bilateral ground-glass densities. No significant pleural effusion. Upper Abdomen: No acute abnormality. Musculoskeletal: No acute or suspicious bone lesion. IMPRESSION: 1. Moderate emphysema with partial consolidations within the bilateral lower lobes and extensive bilateral ground-glass densities, overall findings are suspicious for multifocal pneumonia. No significant pleural effusion. 2. Mild mediastinal adenopathy, likely reactive 3. Borderline cardiomegaly Aortic Atherosclerosis (ICD10-I70.0) and Emphysema (ICD10-J43.9). Electronically Signed   By: Donavan Foil M.D.   On: 05/27/2017 22:17   Nm Pulmonary Perf And Vent  Result Date: 05/27/2017 CLINICAL DATA:  Chest pain. Dyspnea. COPD. Former smoker. Elevated D-dimer. EXAM: NUCLEAR MEDICINE VENTILATION - PERFUSION LUNG SCAN TECHNIQUE: Ventilation images were obtained in multiple projections using inhaled aerosol Tc-36m DTPA. Perfusion images were obtained in multiple projections after intravenous injection of Tc-79m MAA. RADIOPHARMACEUTICALS:  29.9 mCi Technetium-70m DTPA aerosol inhalation and 3.8 mCi Technetium-83m MAA IV COMPARISON:  Chest radiograph from earlier today. FINDINGS: Ventilation: Heterogeneous radiotracer uptake throughout the lungs with central airway accumulation, compatible with airways disease. Perfusion: Mildly heterogeneous perfusion to the left lower lobe, with the tiny regions of decreased perfusion less than the corresponding opacity on chest radiograph. No wedge-shaped peripheral perfusion defects. IMPRESSION: Very low probability for pulmonary embolism (0 - 9%). Electronically Signed   By: Ilona Sorrel M.D.   On: 05/27/2017 16:46   Dg Chest Port 1 View  Result Date: 05/27/2017 CLINICAL DATA:  Increased shortness of breath. EXAM: PORTABLE CHEST 1 VIEW  COMPARISON:  May 27, 2017 FINDINGS: Increasing bilateral infiltrates, left greater than right, most consistent with the multifocal infectious process. Noncardiogenic edema is a possibility. The heart, hila, and mediastinum are otherwise unchanged and unremarkable. IMPRESSION: Worsening bilateral pulmonary infiltrates as above. Electronically Signed   By: Dorise Bullion III M.D   On: 05/27/2017 18:25    Drugs of Abuse     Component Value Date/Time   LABOPIA POSITIVE (A) 05/27/2017 0837   COCAINSCRNUR POSITIVE (A) 05/27/2017 0837   LABBENZ NONE DETECTED 05/27/2017 0837   AMPHETMU NONE DETECTED 05/27/2017 0837   THCU NONE DETECTED 05/27/2017 0837   LABBARB NONE DETECTED 05/27/2017 0837     CULTURES: Blood 9/18 >> Pneumococcus RSV 9/18 >> Influenza PCR 9/18 >> negative  ANTIBIOTICS: Zosyn 9/18 >> 9/19 Zithromax 9/18 >> 9/19 Rocephin 9/19 >>   SIGNIFICANT EVENTS  9/18 > Presents to ED with dyspnea   STUDIES:  V/Q 9/18 > Very low probability for pulmonary embolism (0 - 9%). CT chest 9/18 >> moderate emphysema, b/l patchy consolidation, extensive b/l GGO  DISCUSSION: 66 yo male former smoker with acute hypoxic respiratory failure from PNA, cocaine induced pneumonitis, and COPD exacerbation with emphysema.  PMHx of HTN, Hep C.  ASSESSMENT / PLAN:  Acute hypoxic respiratory failure. AECOPD. Cocaine induced pneumonitis. - oxygen to keep SpO2 > 90% - Bipap as needed - monitor need for intubation - scheduled BDs - continue solumedrol 60 mg q6h -  f/u CXR  Community acquired pneumonia and bacteremia with Pneumococcus. Left leg wound. - change Abx to rocephin - wound care  Hx of HTN. - norvasc, ASA, tenormin  Lactic acidosis in setting of sepsis. - f/u lactic acid - continue IV fluids  Anemia of critical illness. - f/u CBC  Moderate protein calorie malnutrition. - advance diet as able once respiratory status improves  Hx of depression. - abilify, klonopin,  prozac, lamictal  DVT prophylaxis - Lovenox SUP - not indicated Nutrition - Ice chips Goals of care - full code  CC time 45 minutes  Chesley Mires, MD [facility] 05/28/2017, 7:59 AM Pager:  [phone] After 3pm call: [phone]

## 2017-05-28 NOTE — Care Management Note (Signed)
Case Management Note  Patient Details  Name: Valin Massie MRN: [id number] Date of Birth: Jan 22, 1951  Subjective/Objective:       copd             Action/Plan: Date:  May 28, 2017 Chart reviewed for concurrent status and case management needs. Will continue to follow patient progress. Discharge Planning: following for needs Expected discharge date: [id number] Velva Harman, BSN, [place], [place]  Expected Discharge Date:   (unknown)               Expected Discharge Plan:  Home/Self Care  In-House Referral:     Discharge planning Services  CM Consult  Post Acute Care Choice:    Choice offered to:     DME Arranged:    DME Agency:     HH Arranged:    [place] Agency:     Status of Service:  In process, will continue to follow  If discussed at Long Length of Stay Meetings, dates discussed:    Additional Comments:  Leeroy Cha, RN 05/28/2017, 8:53 AM

## 2017-05-28 NOTE — Progress Notes (Addendum)
Initial Nutrition Assessment  DOCUMENTATION CODES:   Non-severe (moderate) malnutrition in context of social or environmental circumstances  INTERVENTION:   Ensure Enlive po BID, each supplement provides 350 kcal and 20 grams of protein  NUTRITION DIAGNOSIS:   Malnutrition (Moderate) related to social / environmental circumstances (polysubstance abuse) as evidenced by energy intake < or equal to 50% for > or equal to 5 days.  GOAL:   Patient will meet greater than or equal to 90% of their needs  MONITOR:   PO intake, Supplement acceptance, Diet advancement, Labs, Weight trends  REASON FOR ASSESSMENT:   Malnutrition Screening Tool    ASSESSMENT:   Pt with PMH of HTN, COPD, polysubstance abuse, depression, BPH. Presents this admission with severe sepsis secondary to acute respiratory failure due to HCAP.   Spoke with pt at bedside who is currently on CPAP. Will try to obtain more information once CPAP d/c.  Reports having decrease in appetite for two weeks related to unknown etiology. Pt typically consumes 1-2 meals per day, but could not elaborate. Pt consuming 3 Ensure's daily at home. Records show pt has maintained a stable wt of 145-150 lb for over a year. Reports a UBW of 140-142 lb during his life span. Nutrition-Focused physical exam completed. Findings are no fat depletion, moderate muscle depletion, and no edema. Suspect pt has some form of malnutrition given his intake recall and muscle depletions. RD to monitor for diet advancement and provide supplementation this hospital stay.   Medications reviewed and include: solumedrol, NS @ 100 ml/hr, IV abx Labs reviewed: AST 50 (H) ALT 12 (L)   Diet Order:  Diet NPO time specified Except for: Sips with Meds, Ice Chips  Skin:  Reviewed, no issues  Last BM:  PTA  Height:   Ht Readings from Last 1 Encounters:  05/28/17 6' (1.829 m)    Weight:   Wt Readings from Last 1 Encounters:  05/28/17 149 lb 14.6 oz (68 kg)     Ideal Body Weight:  80.9 kg  BMI:  Body mass index is 20.33 kg/m.  Estimated Nutritional Needs:   Kcal:  1900-2100 (28-31 kcal/kg)  Protein:  105-115 grams (1.5-1.7 g/kg)  Fluid:  >1.9 L/day  EDUCATION NEEDS:   No education needs identified at this time  [place], LDN Clinical Nutrition Pager # - [phone]

## 2017-05-28 NOTE — Progress Notes (Signed)
PROGRESS NOTE  Travis Salinas XVQ:[id number] DOB: 04-06-1951 DOA: 05/27/2017 PCP: Kathyrn Lass, MD   LOS: 1 day   Brief Narrative / Interim history: Travis Salinas is a 66 y.o. male with medical history significant of hypertension, COPD, depression/anxiety and BPH who presented to the emergency department complaining of progressive worsening shortness of breath for the past 4 days. Patient was seen by his PCP 4 days ago and was diagnosed with COPD exacerbation. She was placed on doxycycline, prednisone and cough syrup. The morning of admission breathing got acutely worse and he developed fevers and chills.  CT scan showed multifocal pneumonia.  Given profound hypoxia he was admitted to stepdown and PCCM was consulted.  Assessment & Plan: Active Problems:   HTN (hypertension)   Multifocal pneumonia   Hypoxia   Sepsis due to pneumococcus ([place])   Sepsis in the setting of multifocal pneumonia -Blood cultures today with gram-positive cocci 1 out of 2 bottles.  ID panel reflects positive for Streptococcus pneumoniae, which is less likely to be a contaminant -Continue ceftriaxone -Respiratory virus panel sent, still pending  COPD exacerbation -In the setting of #1, no further wheezing today, continue duo nebs, continue Solu-Medrol -Critical care following  Acute hypoxic respiratory failure -Due to #1 and #2, on BiPAP, was unable to tolerate high flow nasal cannula started to desat this morning, continue BiPAP for now.  Currently no need for intubation per critical care, d/w Dr. Halford Chessman this morning  AKI on CKD 2 -Creatinine improved 1.04 this morning with fluids  HTN -Blood pressure is stable, continue home medications  Depression/anxiety -Continue home medications  BPH -Continue home medications  Polysubstance abuse -UDS positive for cocaine.   DVT prophylaxis: Lovenox Code Status: Full code Family Communication: Discussed with wife over the phone Disposition Plan:  Remain stepdown  Consultants:   PCCM  Procedures:   BiPAP  Antimicrobials:  Zosyn 9/18 >> 9/19  Ceftriaxone 9/19 >>  Subjective: -feels OK on the BiPAP, no shortness of breath. Denies chest pain. No nausea/vomiting.  Objective: Vitals:   05/28/17 0949 05/28/17 1000 05/28/17 1058 05/28/17 1100  BP: (!) 102/53 (!) 111/53 (!) 112/57   Pulse:  76 75 72  Resp:  15 17 14   Temp:      TempSrc:      SpO2:  91% (!) 88% 96%  Weight:      Height:        Intake/Output Summary (Last 24 hours) at 05/28/17 1154 Last data filed at 05/28/17 0800  Gross per 24 hour  Intake          1963.33 ml  Output              775 ml  Net          1188.33 ml   Filed Weights   05/28/17 0815  Weight: 68 kg (149 lb 14.6 oz)    Examination:  Vitals:   05/28/17 0949 05/28/17 1000 05/28/17 1058 05/28/17 1100  BP: (!) 102/53 (!) 111/53 (!) 112/57   Pulse:  76 75 72  Resp:  15 17 14   Temp:      TempSrc:      SpO2:  91% (!) 88% 96%  Weight:      Height:        Constitutional: Ill-appearing Caucasian male with BiPAP on Eyes: PERRL, lids and conjunctivae normal ENMT: Mucous membranes are dry. Neck: normal, supple Respiratory: bilateral rhonchi, coarse breath sounds, no wheezing, no crackles.  Increased  respiratory effort.  Increased accessory muscle use.  Cardiovascular: Regular rate and rhythm, no murmurs / rubs / gallops. No LE edema. 2+ pedal pulses. No carotid bruits.  Abdomen: no tenderness. Bowel sounds positive.  Musculoskeletal: no clubbing / cyanosis.  Skin: no rashes, lesions, ulcers. No induration Neurologic: CN 2-12 grossly intact. Strength 5/5 in all 4.  Psychiatric: Normal judgment and insight. Alert and oriented x 3. Normal mood.    Data Reviewed: I have independently reviewed following labs and imaging studies   CBC:  Recent Labs Lab 05/27/17 0419 05/28/17 0350  WBC 8.0 6.7  NEUTROABS 7.4  --   HGB 12.5* 10.5*  HCT 36.2* 30.8*  MCV 81.7 81.9  PLT 255 947    Basic Metabolic Panel:  Recent Labs Lab 05/27/17 0419 05/28/17 0350  NA 134* 137  K 3.4* 3.7  CL 100* 106  CO2 22 22  GLUCOSE 121* 140*  BUN 25* 25*  CREATININE 1.40* 1.04  CALCIUM 9.8 8.2*  MG  --  1.9  PHOS  --  3.1   GFR: Estimated Creatinine Clearance: 68.1 mL/min (by C-G formula based on SCr of 1.04 mg/dL). Liver Function Tests:  Recent Labs Lab 05/27/17 0419 05/28/17 0350  AST 49* 50*  ALT 15* 12*  ALKPHOS 49 39  BILITOT 0.7 0.9  PROT 7.3 5.8*  ALBUMIN 3.8 2.7*   No results for input(s): LIPASE, AMYLASE in the last 168 hours. No results for input(s): AMMONIA in the last 168 hours. Coagulation Profile: No results for input(s): INR, PROTIME in the last 168 hours. Cardiac Enzymes:  Recent Labs Lab 05/27/17 1648 05/27/17 2239 05/28/17 0350  TROPONINI <0.03 <0.03 <0.03   BNP (last 3 results) No results for input(s): PROBNP in the last 8760 hours. HbA1C: No results for input(s): HGBA1C in the last 72 hours. CBG: No results for input(s): GLUCAP in the last 168 hours. Lipid Profile: No results for input(s): CHOL, HDL, LDLCALC, TRIG, CHOLHDL, LDLDIRECT in the last 72 hours. Thyroid Function Tests: No results for input(s): TSH, T4TOTAL, FREET4, T3FREE, THYROIDAB in the last 72 hours. Anemia Panel: No results for input(s): VITAMINB12, FOLATE, FERRITIN, TIBC, IRON, RETICCTPCT in the last 72 hours. Urine analysis:    Component Value Date/Time   COLORURINE YELLOW 05/27/2017 0837   APPEARANCEUR CLEAR 05/27/2017 0837   LABSPEC 1.014 05/27/2017 0837   PHURINE 7.0 05/27/2017 0837   GLUCOSEU NEGATIVE 05/27/2017 0837   HGBUR NEGATIVE 05/27/2017 0837   BILIRUBINUR NEGATIVE 05/27/2017 0837   KETONESUR NEGATIVE 05/27/2017 0837   PROTEINUR 100 (A) 05/27/2017 0837   UROBILINOGEN 1.0 11/14/2013 1424   NITRITE NEGATIVE 05/27/2017 0837   LEUKOCYTESUR NEGATIVE 05/27/2017 0837   Sepsis Labs: Invalid input(s): PROCALCITONIN, LACTICIDVEN  Recent Results (from the  past 240 hour(s))  Blood culture (routine x 2)     Status: None (Preliminary result)   Collection Time: 05/27/17  4:34 AM  Result Value Ref Range Status   Specimen Description BLOOD RIGHT FOREARM  Final   Special Requests   Final    BOTTLES DRAWN AEROBIC AND ANAEROBIC Blood Culture adequate volume   Culture  Setup Time   Final    GRAM POSITIVE COCCI ANAEROBIC BOTTLE ONLY CRITICAL RESULT CALLED TO, READ BACK BY AND VERIFIED WITH: Damian Leavell [id number] MLM Performed at [facility], [place] [address], [place], [place] [address]    Culture [facility] POSITIVE COCCI  Final   Report Status PENDING  Incomplete  Blood Culture ID Panel (Reflexed)  Status: Abnormal   Collection Time: 05/27/17  4:34 AM  Result Value Ref Range Status   Enterococcus species NOT DETECTED NOT DETECTED Final   Vancomycin resistance NOT DETECTED NOT DETECTED Final   Listeria monocytogenes NOT DETECTED NOT DETECTED Final   Staphylococcus species NOT DETECTED NOT DETECTED Final   Staphylococcus aureus NOT DETECTED NOT DETECTED Final   Methicillin resistance NOT DETECTED NOT DETECTED Final   Streptococcus species DETECTED (A) NOT DETECTED Final    Comment: CRITICAL RESULT CALLED TO, READ BACK BY AND VERIFIED WITH: PHARMD J GADHIA [id number] MLM    Streptococcus agalactiae NOT DETECTED NOT DETECTED Final   Streptococcus pneumoniae DETECTED (A) NOT DETECTED Final    Comment: CRITICAL RESULT CALLED TO, READ BACK BY AND VERIFIED WITH: PHARMD J GADHIA [id number] MLM    Streptococcus pyogenes NOT DETECTED NOT DETECTED Final   Acinetobacter baumannii NOT DETECTED NOT DETECTED Final   Enterobacteriaceae species NOT DETECTED NOT DETECTED Final   Enterobacter cloacae complex NOT DETECTED NOT DETECTED Final   Escherichia coli NOT DETECTED NOT DETECTED Final   Klebsiella oxytoca NOT DETECTED NOT DETECTED Final   Klebsiella pneumoniae NOT DETECTED NOT DETECTED Final   Proteus species NOT DETECTED NOT DETECTED Final     Serratia marcescens NOT DETECTED NOT DETECTED Final   Carbapenem resistance NOT DETECTED NOT DETECTED Final   Haemophilus influenzae NOT DETECTED NOT DETECTED Final   Neisseria meningitidis NOT DETECTED NOT DETECTED Final   Pseudomonas aeruginosa NOT DETECTED NOT DETECTED Final   Candida albicans NOT DETECTED NOT DETECTED Final   Candida glabrata NOT DETECTED NOT DETECTED Final   Candida krusei NOT DETECTED NOT DETECTED Final   Candida parapsilosis NOT DETECTED NOT DETECTED Final   Candida tropicalis NOT DETECTED NOT DETECTED Final    Comment: Performed at [facility], [address] [place], [place] [address]  MRSA PCR Screening     Status: None   Collection Time: 05/27/17  6:17 PM  Result Value Ref Range Status   MRSA by PCR NEGATIVE NEGATIVE Final    Comment:        The GeneXpert MRSA Assay (FDA approved for NASAL specimens only), is one component of a comprehensive MRSA colonization surveillance program. It is not intended to diagnose MRSA infection nor to guide or monitor treatment for MRSA infections.       Radiology Studies: Dg Chest 2 View  Result Date: 05/27/2017 CLINICAL DATA:  Anxiety and shortness of breath. History of hypertension. COPD. EXAM: CHEST  2 VIEW COMPARISON:  09/27/2016 FINDINGS: Normal heart size and pulmonary vascularity. Bilateral perihilar airspace infiltrates, greater on the right. These are new since previous study in could indicate edema, pneumonia, or aspiration depending on the clinical setting. No blunting of costophrenic angles. No pneumothorax. Mediastinal contours appear intact. Calcification of the aorta. IMPRESSION: Bilateral perihilar infiltrates may indicate edema, pneumonia, or aspiration. Electronically Signed   By: Lucienne Capers M.D.   On: 05/27/2017 05:17   Ct Chest Wo Contrast  Result Date: 05/27/2017 CLINICAL DATA:  Hypoxia EXAM: CT CHEST WITHOUT CONTRAST TECHNIQUE: Multidetector CT imaging of the chest was performed  following the standard protocol without IV contrast. COMPARISON:  Radiograph 05/27/2017, 06/04/2014 FINDINGS: Cardiovascular: Limited evaluation without intravenous contrast. Ectasia of the ascending segment without aneurysmal dilatation. Aortic atherosclerotic calcifications. Coronary artery calcifications. Heart size upper normal. No large pericardial effusion. Mediastinum/Nodes: Mild mediastinal adenopathy. Pretracheal lymph node measures 11 mm. A left paratracheal lymph node measures 9 mm in size. Limited  assessment for hilar adenopathy. Esophagus is within normal limits. Lungs/Pleura: Moderate emphysema. Partial consolidations within the bilateral lower lobes. Extensive bilateral ground-glass densities. No significant pleural effusion. Upper Abdomen: No acute abnormality. Musculoskeletal: No acute or suspicious bone lesion. IMPRESSION: 1. Moderate emphysema with partial consolidations within the bilateral lower lobes and extensive bilateral ground-glass densities, overall findings are suspicious for multifocal pneumonia. No significant pleural effusion. 2. Mild mediastinal adenopathy, likely reactive 3. Borderline cardiomegaly Aortic Atherosclerosis (ICD10-I70.0) and Emphysema (ICD10-J43.9). Electronically Signed   By: Donavan Foil M.D.   On: 05/27/2017 22:17   Nm Pulmonary Perf And Vent  Result Date: 05/27/2017 CLINICAL DATA:  Chest pain. Dyspnea. COPD. Former smoker. Elevated D-dimer. EXAM: NUCLEAR MEDICINE VENTILATION - PERFUSION LUNG SCAN TECHNIQUE: Ventilation images were obtained in multiple projections using inhaled aerosol Tc-57m DTPA. Perfusion images were obtained in multiple projections after intravenous injection of Tc-26m MAA. RADIOPHARMACEUTICALS:  29.9 mCi Technetium-98m DTPA aerosol inhalation and 3.8 mCi Technetium-47m MAA IV COMPARISON:  Chest radiograph from earlier today. FINDINGS: Ventilation: Heterogeneous radiotracer uptake throughout the lungs with central airway accumulation,  compatible with airways disease. Perfusion: Mildly heterogeneous perfusion to the left lower lobe, with the tiny regions of decreased perfusion less than the corresponding opacity on chest radiograph. No wedge-shaped peripheral perfusion defects. IMPRESSION: Very low probability for pulmonary embolism (0 - 9%). Electronically Signed   By: Ilona Sorrel M.D.   On: 05/27/2017 16:46   Dg Chest Port 1 View  Result Date: 05/27/2017 CLINICAL DATA:  Increased shortness of breath. EXAM: PORTABLE CHEST 1 VIEW COMPARISON:  May 27, 2017 FINDINGS: Increasing bilateral infiltrates, left greater than right, most consistent with the multifocal infectious process. Noncardiogenic edema is a possibility. The heart, hila, and mediastinum are otherwise unchanged and unremarkable. IMPRESSION: Worsening bilateral pulmonary infiltrates as above. Electronically Signed   By: Dorise Bullion III M.D   On: 05/27/2017 18:25     Scheduled Meds: . amLODipine  10 mg Oral q morning - 10a  . ARIPiprazole  20 mg Oral Daily  . aspirin EC  81 mg Oral Daily  . atenolol  25 mg Oral BH-q7a  . clonazePAM  0.5 mg Oral BID  . dextromethorphan-guaiFENesin  1 tablet Oral BID  . enoxaparin (LOVENOX) injection  40 mg Subcutaneous Q24H  . FLUoxetine  20 mg Oral Daily  . ipratropium-albuterol  3 mL Nebulization Q6H  . lamoTRIgine  200 mg Oral q morning - 10a  . methylPREDNISolone (SOLU-MEDROL) injection  60 mg Intravenous Q6H  . tamsulosin  0.4 mg Oral Daily   Continuous Infusions: . sodium chloride 100 mL/hr at 05/28/17 0400  . cefTRIAXone (ROCEPHIN)  IV Stopped (05/28/17 1015)    Marzetta Board, MD, PhD [facility] Pager [phone]  If 7PM-7AM, please contact night-coverage [email or web address] Password [email or web address] 05/28/2017, 11:54 AM

## 2017-05-28 NOTE — Progress Notes (Signed)
Pt taken off BIPAP and placed on 15 LPM [place],  Pt tolerating well at this time, RT to monitor and assess as needed.

## 2017-05-29 ENCOUNTER — Inpatient Hospital Stay (HOSPITAL_COMMUNITY): Payer: 59

## 2017-05-29 DIAGNOSIS — A403 Sepsis due to Streptococcus pneumoniae: Principal | ICD-10-CM

## 2017-05-29 LAB — BASIC METABOLIC PANEL
ANION GAP: 9 (ref 5–15)
BUN: 31 mg/dL — ABNORMAL HIGH (ref 6–20)
CHLORIDE: 109 mmol/L (ref 101–111)
CO2: 22 mmol/L (ref 22–32)
CREATININE: 0.93 mg/dL (ref 0.61–1.24)
Calcium: 8.2 mg/dL — ABNORMAL LOW (ref 8.9–10.3)
GFR calc non Af Amer: >60 mL/min (ref >60)
Glucose, Bld: 125 mg/dL — ABNORMAL HIGH (ref 65–99)
Potassium: 3.8 mmol/L (ref 3.5–5.1)
SODIUM: 140 mmol/L (ref 135–145)

## 2017-05-29 LAB — CBC
HCT: 32.4 % — ABNORMAL LOW (ref 39.0–52.0)
HEMOGLOBIN: 10.8 g/dL — AB (ref 13.0–17.0)
MCH: 27 pg (ref 26.0–34.0)
MCHC: 33.3 g/dL (ref 30.0–36.0)
MCV: 81 fL (ref 78.0–100.0)
PLATELETS: 265 10*3/uL (ref 150–400)
RBC: 4 MIL/uL — AB (ref 4.22–5.81)
RDW: 14.5 % (ref 11.5–15.5)
WBC: 11.3 10*3/uL — AB (ref 4.0–10.5)

## 2017-05-29 LAB — PROCALCITONIN: Procalcitonin: 2.77 ng/mL

## 2017-05-29 MED ORDER — FENTANYL CITRATE (PF) 100 MCG/2ML IJ SOLN
50.0000 ug | INTRAMUSCULAR | Status: DC | PRN
  Administered 2017-05-29 (×3): 100 ug via INTRAVENOUS
  Administered 2017-05-29: 50 ug via INTRAVENOUS
  Administered 2017-05-30 – 2017-06-06 (×48): 100 ug via INTRAVENOUS
  Administered 2017-06-06: 50 ug via INTRAVENOUS
  Administered 2017-06-06 (×2): 100 ug via INTRAVENOUS
  Administered 2017-06-06 (×3): 50 ug via INTRAVENOUS
  Administered 2017-06-06: 100 ug via INTRAVENOUS
  Administered 2017-06-07 (×4): 50 ug via INTRAVENOUS
  Administered 2017-06-07: 25 ug via INTRAVENOUS
  Administered 2017-06-07 (×2): 50 ug via INTRAVENOUS
  Administered 2017-06-08 (×2): 100 ug via INTRAVENOUS
  Administered 2017-06-08: 50 ug via INTRAVENOUS
  Administered 2017-06-08 (×2): 100 ug via INTRAVENOUS
  Administered 2017-06-08: 50 ug via INTRAVENOUS
  Administered 2017-06-08: 100 ug via INTRAVENOUS
  Administered 2017-06-09: 50 ug via INTRAVENOUS
  Administered 2017-06-09 (×2): 100 ug via INTRAVENOUS
  Administered 2017-06-09 (×2): 50 ug via INTRAVENOUS
  Administered 2017-06-09 (×4): 100 ug via INTRAVENOUS
  Administered 2017-06-09: 50 ug via INTRAVENOUS
  Administered 2017-06-10 – 2017-06-11 (×15): 100 ug via INTRAVENOUS
  Administered 2017-06-11: 50 ug via INTRAVENOUS
  Administered 2017-06-11: 100 ug via INTRAVENOUS
  Administered 2017-06-12 (×2): 50 ug via INTRAVENOUS
  Administered 2017-06-12: 100 ug via INTRAVENOUS
  Administered 2017-06-12: 50 ug via INTRAVENOUS
  Administered 2017-06-12: 100 ug via INTRAVENOUS
  Administered 2017-06-12 (×3): 50 ug via INTRAVENOUS
  Administered 2017-06-12 (×2): 100 ug via INTRAVENOUS
  Administered 2017-06-12 – 2017-06-13 (×5): 50 ug via INTRAVENOUS
  Filled 2017-05-29 (×119): qty 2

## 2017-05-29 MED ORDER — LORAZEPAM 2 MG/ML IJ SOLN
0.5000 mg | Freq: Once | INTRAMUSCULAR | Status: AC
  Administered 2017-05-29: 0.5 mg via INTRAVENOUS
  Filled 2017-05-29: qty 1

## 2017-05-29 NOTE — Consult Note (Signed)
   [facility] CM Inpatient Consult   05/29/2017  Olly Shiner 09-Dec-1950 [id number]    Patient screened for [facility] Care Management/Link to Wellness program for [employer] employees/dependents with [facility] insurance.  Noted Mr. Lavel Rieman is currently on stepdown unit. Will continue to follow and attempt to speak with him at a more appropriate time.  [name], MSN-Ed, RN,BSN [facility] Liaison [phone]

## 2017-05-29 NOTE — Progress Notes (Signed)
PROGRESS NOTE  Ezekiel Menzer WEX:[id number] DOB: [birth year] DOA: 05/27/2017 PCP: Kathyrn Lass, MD   LOS: 2 days   Brief Narrative / Interim history: Travis Salinas is a 66 y.o. male with medical history significant of hypertension, COPD, depression/anxiety and BPH who presented to the emergency department complaining of progressive worsening shortness of breath for the past 4 days. Patient was seen by his PCP 4 days ago and was diagnosed with COPD exacerbation. She was placed on doxycycline, prednisone and cough syrup. The morning of admission breathing got acutely worse and he developed fevers and chills.  CT scan showed multifocal pneumonia.  Given profound hypoxia he was admitted to stepdown and PCCM was consulted.  Assessment & Plan: Active Problems:   HTN (hypertension)   Multifocal pneumonia   Hypoxia   Sepsis due to pneumococcus ([place])   Sepsis in the setting of multifocal pneumonia -Blood cultures with gram-positive cocci, Streptococcus pneumonia 1 out of 2 bottles.  ID panel also reflects positive for Streptococcus pneumoniae, which is less likely to be a contaminant.  Surveillance cultures today.  No routine 2D echo is required for Streptococcus pneumonia bacteremia, discussed with ID today.  Sensitivities pending -Continue ceftriaxone -Respiratory virus panel sent, came back positive with rhinovirus.  Supportive treatment  COPD exacerbation -In the setting of #1, no further wheezing today, continue duo nebs, continue Solu-Medrol -Critical care following  Acute hypoxic respiratory failure -Due to #1 and #2, on BiPAP, continues to require BiPAP overnight on 50% FiO2 which is an improvement from admission.  We will attempt to wean off today, place patient on high flow oxygen  AKI on CKD 2 -Creatinine improved to 0.93 this morning with fluids  HTN -Blood pressure is stable, continue home medications  Depression/anxiety -Continue home medications  BPH -Continue  home medications  Polysubstance abuse -UDS positive for cocaine.   DVT prophylaxis: Lovenox Code Status: Full code Family Communication: Discussed with wife at bedside Disposition Plan: Remain in stepdown  Consultants:   PCCM  Procedures:   BiPAP  Antimicrobials:  Zosyn 9/18 >> 9/19  Ceftriaxone 9/19 >>  Subjective: -Feels a little bit better, still short of breath.  Chest pain is improved.  Objective: Vitals:   05/29/17 0834 05/29/17 1025 05/29/17 1034 05/29/17 1140  BP:  (!) 142/73    Pulse: 91 92 95 (!) 102  Resp: 19 20 (!) 24 (!) 34  Temp:      TempSrc:      SpO2: 92% 90% (!) 89% (!) 84%  Weight:      Height:        Intake/Output Summary (Last 24 hours) at 05/29/17 1204 Last data filed at 05/29/17 1022  Gross per 24 hour  Intake             2850 ml  Output             1025 ml  Net             1825 ml   Filed Weights   05/28/17 0815  Weight: 68 kg (149 lb 14.6 oz)    Examination:  Vitals:   05/29/17 0834 05/29/17 1025 05/29/17 1034 05/29/17 1140  BP:  (!) 142/73    Pulse: 91 92 95 (!) 102  Resp: 19 20 (!) 24 (!) 34  Temp:      TempSrc:      SpO2: 92% 90% (!) 89% (!) 84%  Weight:      Height:  Constitutional: more comfortable this morning Eyes: lids and conjunctivae normal Respiratory: Bilateral rhonchi, coarse breath sounds, no wheezing.  Normal respiratory effort. Cardiovascular: Regular rate and rhythm, no murmurs / rubs / gallops. No LE edema. 2+ pedal pulses.  Abdomen: no tenderness. Bowel sounds positive.  Skin: no rashes, lesions, ulcers. No induration Neurologic: non focal    Data Reviewed: I have independently reviewed following labs and imaging studies   CBC:  Recent Labs Lab 05/27/17 0419 05/28/17 0350 05/29/17 0741  WBC 8.0 6.7 11.3*  NEUTROABS 7.4  --   --   HGB 12.5* 10.5* 10.8*  HCT 36.2* 30.8* 32.4*  MCV 81.7 81.9 81.0  PLT 255 216 341   Basic Metabolic Panel:  Recent Labs Lab 05/27/17 0419  05/28/17 0350 05/29/17 0741  NA 134* 137 140  K 3.4* 3.7 3.8  CL 100* 106 109  CO2 22 22 22   GLUCOSE 121* 140* 125*  BUN 25* 25* 31*  CREATININE 1.40* 1.04 0.93  CALCIUM 9.8 8.2* 8.2*  MG  --  1.9  --   PHOS  --  3.1  --    GFR: Estimated Creatinine Clearance: 76.2 mL/min (by C-G formula based on SCr of 0.93 mg/dL). Liver Function Tests:  Recent Labs Lab 05/27/17 0419 05/28/17 0350  AST 49* 50*  ALT 15* 12*  ALKPHOS 49 39  BILITOT 0.7 0.9  PROT 7.3 5.8*  ALBUMIN 3.8 2.7*   No results for input(s): LIPASE, AMYLASE in the last 168 hours. No results for input(s): AMMONIA in the last 168 hours. Coagulation Profile: No results for input(s): INR, PROTIME in the last 168 hours. Cardiac Enzymes:  Recent Labs Lab 05/27/17 1648 05/27/17 2239 05/28/17 0350  TROPONINI <0.03 <0.03 <0.03   BNP (last 3 results) No results for input(s): PROBNP in the last 8760 hours. HbA1C: No results for input(s): HGBA1C in the last 72 hours. CBG: No results for input(s): GLUCAP in the last 168 hours. Lipid Profile: No results for input(s): CHOL, HDL, LDLCALC, TRIG, CHOLHDL, LDLDIRECT in the last 72 hours. Thyroid Function Tests: No results for input(s): TSH, T4TOTAL, FREET4, T3FREE, THYROIDAB in the last 72 hours. Anemia Panel: No results for input(s): VITAMINB12, FOLATE, FERRITIN, TIBC, IRON, RETICCTPCT in the last 72 hours. Urine analysis:    Component Value Date/Time   COLORURINE YELLOW 05/27/2017 0837   APPEARANCEUR CLEAR 05/27/2017 0837   LABSPEC 1.014 05/27/2017 0837   PHURINE 7.0 05/27/2017 0837   GLUCOSEU NEGATIVE 05/27/2017 0837   HGBUR NEGATIVE 05/27/2017 0837   BILIRUBINUR NEGATIVE 05/27/2017 0837   KETONESUR NEGATIVE 05/27/2017 0837   PROTEINUR 100 (A) 05/27/2017 0837   UROBILINOGEN 1.0 11/14/2013 1424   NITRITE NEGATIVE 05/27/2017 0837   LEUKOCYTESUR NEGATIVE 05/27/2017 0837   Sepsis Labs: Invalid input(s): PROCALCITONIN, LACTICIDVEN  Recent Results (from the  past 240 hour(s))  Blood culture (routine x 2)     Status: None (Preliminary result)   Collection Time: 05/27/17  4:30 AM  Result Value Ref Range Status   Specimen Description BLOOD LEFT ARM  Final   Special Requests   Final    BOTTLES DRAWN AEROBIC AND ANAEROBIC Blood Culture adequate volume   Culture   Final    NO GROWTH 1 DAY Performed at [facility], [address], [place], [place] [address]    Report Status PENDING  Incomplete  Blood culture (routine x 2)     Status: Abnormal (Preliminary result)   Collection Time: 05/27/17  4:34 AM  Result Value Ref  Range Status   Specimen Description BLOOD RIGHT FOREARM  Final   Special Requests   Final    BOTTLES DRAWN AEROBIC AND ANAEROBIC Blood Culture adequate volume   Culture  Setup Time   Final    GRAM POSITIVE COCCI ANAEROBIC BOTTLE ONLY CRITICAL RESULT CALLED TO, READ BACK BY AND VERIFIED WITH: PHARMD Melodye Ped [id number] MLM    Culture (A)  Final    STREPTOCOCCUS PNEUMONIAE SUSCEPTIBILITIES TO FOLLOW Performed at [facility], [place] [address] [place], [place] [address]    Report Status PENDING  Incomplete  Blood Culture ID Panel (Reflexed)     Status: Abnormal   Collection Time: 05/27/17  4:34 AM  Result Value Ref Range Status   Enterococcus species NOT DETECTED NOT DETECTED Final   Vancomycin resistance NOT DETECTED NOT DETECTED Final   Listeria monocytogenes NOT DETECTED NOT DETECTED Final   Staphylococcus species NOT DETECTED NOT DETECTED Final   Staphylococcus aureus NOT DETECTED NOT DETECTED Final   Methicillin resistance NOT DETECTED NOT DETECTED Final   Streptococcus species DETECTED (A) NOT DETECTED Final    Comment: CRITICAL RESULT CALLED TO, READ BACK BY AND VERIFIED WITH: PHARMD J GADHIA [id number] MLM    Streptococcus agalactiae NOT DETECTED NOT DETECTED Final   Streptococcus pneumoniae DETECTED (A) NOT DETECTED Final    Comment: CRITICAL RESULT CALLED TO, READ BACK BY AND VERIFIED WITH: PHARMD J  GADHIA [id number] 0815 MLM    Streptococcus pyogenes NOT DETECTED NOT DETECTED Final   Acinetobacter baumannii NOT DETECTED NOT DETECTED Final   Enterobacteriaceae species NOT DETECTED NOT DETECTED Final   Enterobacter cloacae complex NOT DETECTED NOT DETECTED Final   Escherichia coli NOT DETECTED NOT DETECTED Final   Klebsiella oxytoca NOT DETECTED NOT DETECTED Final   Klebsiella pneumoniae NOT DETECTED NOT DETECTED Final   Proteus species NOT DETECTED NOT DETECTED Final   Serratia marcescens NOT DETECTED NOT DETECTED Final   Carbapenem resistance NOT DETECTED NOT DETECTED Final   Haemophilus influenzae NOT DETECTED NOT DETECTED Final   Neisseria meningitidis NOT DETECTED NOT DETECTED Final   Pseudomonas aeruginosa NOT DETECTED NOT DETECTED Final   Candida albicans NOT DETECTED NOT DETECTED Final   Candida glabrata NOT DETECTED NOT DETECTED Final   Candida krusei NOT DETECTED NOT DETECTED Final   Candida parapsilosis NOT DETECTED NOT DETECTED Final   Candida tropicalis NOT DETECTED NOT DETECTED Final    Comment: Performed at [facility], [address] [place], [place] [address]  MRSA PCR Screening     Status: None   Collection Time: 05/27/17  6:17 PM  Result Value Ref Range Status   MRSA by PCR NEGATIVE NEGATIVE Final    Comment:        The GeneXpert MRSA Assay (FDA approved for NASAL specimens only), is one component of a comprehensive MRSA colonization surveillance program. It is not intended to diagnose MRSA infection nor to guide or monitor treatment for MRSA infections.   Respiratory Panel by PCR     Status: Abnormal   Collection Time: 05/27/17 10:55 PM  Result Value Ref Range Status   Adenovirus NOT DETECTED NOT DETECTED Final   Coronavirus 229E NOT DETECTED NOT DETECTED Final   Coronavirus HKU1 NOT DETECTED NOT DETECTED Final   Coronavirus NL63 NOT DETECTED NOT DETECTED Final   Coronavirus OC43 NOT DETECTED NOT DETECTED Final   Metapneumovirus NOT DETECTED NOT  DETECTED Final   Rhinovirus / Enterovirus DETECTED (A) NOT DETECTED Final  Influenza A NOT DETECTED NOT DETECTED Final   Influenza A H1 NOT DETECTED NOT DETECTED Final   Influenza A H1 2009 NOT DETECTED NOT DETECTED Final   Influenza A H3 NOT DETECTED NOT DETECTED Final   Influenza B NOT DETECTED NOT DETECTED Final   Parainfluenza Virus 1 NOT DETECTED NOT DETECTED Final   Parainfluenza Virus 2 NOT DETECTED NOT DETECTED Final   Parainfluenza Virus 3 NOT DETECTED NOT DETECTED Final   Parainfluenza Virus 4 NOT DETECTED NOT DETECTED Final   Respiratory Syncytial Virus NOT DETECTED NOT DETECTED Final   Bordetella pertussis NOT DETECTED NOT DETECTED Final   Chlamydophila pneumoniae NOT DETECTED NOT DETECTED Final   Mycoplasma pneumoniae NOT DETECTED NOT DETECTED Final    Comment: Performed at [facility], [place] [address], [place], [place] [address]      Radiology Studies: Ct Chest Wo Contrast  Result Date: 05/27/2017 CLINICAL DATA:  Hypoxia EXAM: CT CHEST WITHOUT CONTRAST TECHNIQUE: Multidetector CT imaging of the chest was performed following the standard protocol without IV contrast. COMPARISON:  Radiograph 05/27/2017, 06/04/2014 FINDINGS: Cardiovascular: Limited evaluation without intravenous contrast. Ectasia of the ascending segment without aneurysmal dilatation. Aortic atherosclerotic calcifications. Coronary artery calcifications. Heart size upper normal. No large pericardial effusion. Mediastinum/Nodes: Mild mediastinal adenopathy. Pretracheal lymph node measures 11 mm. A left paratracheal lymph node measures 9 mm in size. Limited assessment for hilar adenopathy. Esophagus is within normal limits. Lungs/Pleura: Moderate emphysema. Partial consolidations within the bilateral lower lobes. Extensive bilateral ground-glass densities. No significant pleural effusion. Upper Abdomen: No acute abnormality. Musculoskeletal: No acute or suspicious bone lesion. IMPRESSION: 1. Moderate emphysema  with partial consolidations within the bilateral lower lobes and extensive bilateral ground-glass densities, overall findings are suspicious for multifocal pneumonia. No significant pleural effusion. 2. Mild mediastinal adenopathy, likely reactive 3. Borderline cardiomegaly Aortic Atherosclerosis (ICD10-I70.0) and Emphysema (ICD10-J43.9). Electronically Signed   By: Donavan Foil M.D.   On: 05/27/2017 22:17   Nm Pulmonary Perf And Vent  Result Date: 05/27/2017 CLINICAL DATA:  Chest pain. Dyspnea. COPD. Former smoker. Elevated D-dimer. EXAM: NUCLEAR MEDICINE VENTILATION - PERFUSION LUNG SCAN TECHNIQUE: Ventilation images were obtained in multiple projections using inhaled aerosol Tc-72m DTPA. Perfusion images were obtained in multiple projections after intravenous injection of Tc-9m MAA. RADIOPHARMACEUTICALS:  29.9 mCi Technetium-2m DTPA aerosol inhalation and 3.8 mCi Technetium-59m MAA IV COMPARISON:  Chest radiograph from earlier today. FINDINGS: Ventilation: Heterogeneous radiotracer uptake throughout the lungs with central airway accumulation, compatible with airways disease. Perfusion: Mildly heterogeneous perfusion to the left lower lobe, with the tiny regions of decreased perfusion less than the corresponding opacity on chest radiograph. No wedge-shaped peripheral perfusion defects. IMPRESSION: Very low probability for pulmonary embolism (0 - 9%). Electronically Signed   By: Ilona Sorrel M.D.   On: 05/27/2017 16:46   Dg Chest Port 1 View  Result Date: 05/29/2017 CLINICAL DATA:  Respiratory failure. EXAM: PORTABLE CHEST 1 VIEW COMPARISON:  CT 05/27/2017.  Chest x-ray 05/27/2017. FINDINGS: Mediastinum hilar structures are normal. Heart size normal. Diffuse bilateral pulmonary infiltrates again noted. No significant change. Stable cardiomegaly. No pleural effusion or pneumothorax. IMPRESSION: 1. Diffuse multifocal bilateral pulmonary infiltrates without significant change. 2. Stable cardiomegaly.  Electronically Signed   By: Marcello Moores  Register   On: 05/29/2017 06:10   Dg Chest Port 1 View  Result Date: 05/27/2017 CLINICAL DATA:  Increased shortness of breath. EXAM: PORTABLE CHEST 1 VIEW COMPARISON:  May 27, 2017 FINDINGS: Increasing bilateral infiltrates, left greater than right, most consistent with the  multifocal infectious process. Noncardiogenic edema is a possibility. The heart, hila, and mediastinum are otherwise unchanged and unremarkable. IMPRESSION: Worsening bilateral pulmonary infiltrates as above. Electronically Signed   By: Dorise Bullion III M.D   On: 05/27/2017 18:25     Scheduled Meds: . amLODipine  10 mg Oral q morning - 10a  . ARIPiprazole  20 mg Oral Daily  . aspirin EC  81 mg Oral Daily  . atenolol  25 mg Oral BH-q7a  . clonazePAM  0.5 mg Oral BID  . dextromethorphan-guaiFENesin  1 tablet Oral BID  . enoxaparin (LOVENOX) injection  40 mg Subcutaneous Q24H  . feeding supplement (ENSURE ENLIVE)  237 mL Oral BID BM  . FLUoxetine  20 mg Oral Daily  . ipratropium-albuterol  3 mL Nebulization Q6H  . lamoTRIgine  200 mg Oral q morning - 10a  . methylPREDNISolone (SOLU-MEDROL) injection  60 mg Intravenous Q6H  . tamsulosin  0.4 mg Oral Daily   Continuous Infusions: . sodium chloride 50 mL/hr at 05/29/17 0833  . cefTRIAXone (ROCEPHIN)  IV Stopped (05/29/17 1052)    Marzetta Board, MD, PhD [facility] Pager [phone]  If 7PM-7AM, please contact night-coverage [email or web address] Password [email or web address] 05/29/2017, 12:04 PM

## 2017-05-29 NOTE — Progress Notes (Addendum)
Pt tolerated HFNC on 15 LPM well for the majority of the night. Around 0315 , Pt became restless with c/o anxiety. Pt desat into the 80s.  Pt tends to require more 02 when anxious. RN re-started Bi-pap. MD and RT notified. Current O2 Sat is 95%.  RN will continue to monitor.

## 2017-05-29 NOTE — Progress Notes (Signed)
Name: Travis Salinas MRN: [id number] DOB: 1951/08/03    ADMISSION DATE:  05/27/2017 CONSULTATION DATE:  05/27/2017  REFERRING MD :  Dr. Patrecia Pour   CHIEF COMPLAINT:  Hypoxia   HISTORY OF PRESENT ILLNESS:   66 yo male with dyspnea, fever (101.5), chills, weakness, hypoxia from CAP with AECOPD.  Hx of Hep C, HTN, polysubstance abuse.  SUBJECTIVE:  Breathing better.  Still has diffuse pain.  Needed Bipap overnight.  VITAL SIGNS: Temp:  [97.4 F (36.3 C)-98.2 F (36.8 C)] 97.4 F (36.3 C) (09/20 0700) Pulse Rate:  [69-85] 77 (09/20 0600) Resp:  [14-27] 25 (09/20 0600) BP: (98-123)/(42-68) 121/60 (09/20 0600) SpO2:  [85 %-100 %] 98 % (09/20 0600) FiO2 (%):  [50 %-90 %] 50 % (09/20 0414)  PHYSICAL EXAMINATION:  General - alert Eyes - pupils reactive ENT - bipap mask on Cardiac - regular, no murmur Chest - no wheeze, rales Abd - soft, non tender Ext - no edema Skin - no rashes Neuro - normal strength    CMP Latest Ref Rng & Units 05/28/2017 05/27/2017 01/31/2017  Glucose 65 - 99 mg/dL 140(H) 121(H) 94  BUN 6 - 20 mg/dL 25(H) 25(H) 20  Creatinine 0.61 - 1.24 mg/dL 1.04 1.40(H) 1.20  Sodium 135 - 145 mmol/L 137 134(L) 140  Potassium 3.5 - 5.1 mmol/L 3.7 3.4(L) 3.9  Chloride 101 - 111 mmol/L 106 100(L) 105  CO2 22 - 32 mmol/L 22 22 28   Calcium 8.9 - 10.3 mg/dL 8.2(L) 9.8 9.5  Total Protein 6.5 - 8.1 g/dL 5.8(L) 7.3 -  Total Bilirubin 0.3 - 1.2 mg/dL 0.9 0.7 -  Alkaline Phos 38 - 126 U/L 39 49 -  AST 15 - 41 U/L 50(H) 49(H) -  ALT 17 - 63 U/L 12(L) 15(L) -    CBC Latest Ref Rng & Units 05/28/2017 05/27/2017 01/31/2017  WBC 4.0 - 10.5 K/uL 6.7 8.0 12.9(H)  Hemoglobin 13.0 - 17.0 g/dL 10.5(L) 12.5(L) 12.1(L)  Hematocrit 39.0 - 52.0 % 30.8(L) 36.2(L) 38.7(L)  Platelets 150 - 400 K/uL 216 255 330    ABG    Component Value Date/Time   PHART 7.408 05/28/2017 0015   PCO2ART 36.4 05/28/2017 0015   PO2ART 58.6 (L) 05/28/2017 0015   HCO3 22.6 05/28/2017 0015   TCO2  24 12/07/2015 1403   ACIDBASEDEF 1.3 05/28/2017 0015   O2SAT 89.1 05/28/2017 0015    CBG (last 3)  No results for input(s): GLUCAP in the last 72 hours.   Ct Chest Wo Contrast  Result Date: 05/27/2017 CLINICAL DATA:  Hypoxia EXAM: CT CHEST WITHOUT CONTRAST TECHNIQUE: Multidetector CT imaging of the chest was performed following the standard protocol without IV contrast. COMPARISON:  Radiograph 05/27/2017, 06/04/2014 FINDINGS: Cardiovascular: Limited evaluation without intravenous contrast. Ectasia of the ascending segment without aneurysmal dilatation. Aortic atherosclerotic calcifications. Coronary artery calcifications. Heart size upper normal. No large pericardial effusion. Mediastinum/Nodes: Mild mediastinal adenopathy. Pretracheal lymph node measures 11 mm. A left paratracheal lymph node measures 9 mm in size. Limited assessment for hilar adenopathy. Esophagus is within normal limits. Lungs/Pleura: Moderate emphysema. Partial consolidations within the bilateral lower lobes. Extensive bilateral ground-glass densities. No significant pleural effusion. Upper Abdomen: No acute abnormality. Musculoskeletal: No acute or suspicious bone lesion. IMPRESSION: 1. Moderate emphysema with partial consolidations within the bilateral lower lobes and extensive bilateral ground-glass densities, overall findings are suspicious for multifocal pneumonia. No significant pleural effusion. 2. Mild mediastinal adenopathy, likely reactive 3. Borderline cardiomegaly Aortic Atherosclerosis (ICD10-I70.0) and Emphysema (ICD10-J43.9).  Electronically Signed   By: Donavan Foil M.D.   On: 05/27/2017 22:17   Nm Pulmonary Perf And Vent  Result Date: 05/27/2017 CLINICAL DATA:  Chest pain. Dyspnea. COPD. Former smoker. Elevated D-dimer. EXAM: NUCLEAR MEDICINE VENTILATION - PERFUSION LUNG SCAN TECHNIQUE: Ventilation images were obtained in multiple projections using inhaled aerosol Tc-56m DTPA. Perfusion images were obtained in  multiple projections after intravenous injection of Tc-63m MAA. RADIOPHARMACEUTICALS:  29.9 mCi Technetium-37m DTPA aerosol inhalation and 3.8 mCi Technetium-53m MAA IV COMPARISON:  Chest radiograph from earlier today. FINDINGS: Ventilation: Heterogeneous radiotracer uptake throughout the lungs with central airway accumulation, compatible with airways disease. Perfusion: Mildly heterogeneous perfusion to the left lower lobe, with the tiny regions of decreased perfusion less than the corresponding opacity on chest radiograph. No wedge-shaped peripheral perfusion defects. IMPRESSION: Very low probability for pulmonary embolism (0 - 9%). Electronically Signed   By: Ilona Sorrel M.D.   On: 05/27/2017 16:46   Dg Chest Port 1 View  Result Date: 05/29/2017 CLINICAL DATA:  Respiratory failure. EXAM: PORTABLE CHEST 1 VIEW COMPARISON:  CT 05/27/2017.  Chest x-ray 05/27/2017. FINDINGS: Mediastinum hilar structures are normal. Heart size normal. Diffuse bilateral pulmonary infiltrates again noted. No significant change. Stable cardiomegaly. No pleural effusion or pneumothorax. IMPRESSION: 1. Diffuse multifocal bilateral pulmonary infiltrates without significant change. 2. Stable cardiomegaly. Electronically Signed   By: Marcello Moores  Register   On: 05/29/2017 06:10   Dg Chest Port 1 View  Result Date: 05/27/2017 CLINICAL DATA:  Increased shortness of breath. EXAM: PORTABLE CHEST 1 VIEW COMPARISON:  May 27, 2017 FINDINGS: Increasing bilateral infiltrates, left greater than right, most consistent with the multifocal infectious process. Noncardiogenic edema is a possibility. The heart, hila, and mediastinum are otherwise unchanged and unremarkable. IMPRESSION: Worsening bilateral pulmonary infiltrates as above. Electronically Signed   By: Dorise Bullion III M.D   On: 05/27/2017 18:25    Drugs of Abuse     Component Value Date/Time   LABOPIA POSITIVE (A) 05/27/2017 0837   COCAINSCRNUR POSITIVE (A) 05/27/2017 0837    LABBENZ NONE DETECTED 05/27/2017 0837   AMPHETMU NONE DETECTED 05/27/2017 0837   THCU NONE DETECTED 05/27/2017 0837   LABBARB NONE DETECTED 05/27/2017 0837     CULTURES: Blood 9/18 >> Pneumococcus RSV 9/18 >> Rhinovirus Influenza PCR 9/18 >> negative  ANTIBIOTICS: Zosyn 9/18 >> 9/19 Zithromax 9/18 >> 9/19 Rocephin 9/19 >>   SIGNIFICANT EVENTS  9/18 > Presents to ED with dyspnea   STUDIES:  V/Q 9/18 > Very low probability for pulmonary embolism (0 - 9%). CT chest 9/18 >> moderate emphysema, b/l patchy consolidation, extensive b/l GGO  DISCUSSION: 66 yo male former smoker with acute hypoxic respiratory failure from PNA, cocaine induced pneumonitis, and COPD exacerbation with emphysema.  PMHx of HTN, Hep C.  ASSESSMENT / PLAN:  Acute hypoxic respiratory failure. AECOPD. Cocaine induced pneumonitis. - oxygen to keep SpO2 > 90% - Bipap prn - scheduled BDs - change solumedrol to 40 mg q8h - f/u CXR  Community acquired pneumonia and bacteremia with Pneumococcus, and rhinovirus. Left leg wound. - Day 3 of Abx, currently on rocephin - wound care  Hx of HTN. - norvasc, ASA, tenormin  Lactic acidosis in setting of sepsis. - continue IV fluids  Anemia of critical illness. - f/u CBC  Moderate protein calorie malnutrition. - advance diet as able  Hx of depression. - abilify, klonopin, prozac, lamictal  DVT prophylaxis - Lovenox SUP - not indicated Nutrition - full liquids Goals of care - full  code  Chesley Mires, MD [facility] Pulmonary/Critical Care 05/29/2017, 8:15 AM Pager:  [phone] After 3pm call: [phone]

## 2017-05-30 ENCOUNTER — Inpatient Hospital Stay (HOSPITAL_COMMUNITY): Payer: 59

## 2017-05-30 LAB — CBC
HCT: 29.2 % — ABNORMAL LOW (ref 39.0–52.0)
Hemoglobin: 10.1 g/dL — ABNORMAL LOW (ref 13.0–17.0)
MCH: 28 pg (ref 26.0–34.0)
MCHC: 34.6 g/dL (ref 30.0–36.0)
MCV: 80.9 fL (ref 78.0–100.0)
PLATELETS: 263 10*3/uL (ref 150–400)
RBC: 3.61 MIL/uL — AB (ref 4.22–5.81)
RDW: 14.2 % (ref 11.5–15.5)
WBC: 15.6 10*3/uL — ABNORMAL HIGH (ref 4.0–10.5)

## 2017-05-30 LAB — CULTURE, BLOOD (ROUTINE X 2): SPECIAL REQUESTS: ADEQUATE

## 2017-05-30 LAB — BASIC METABOLIC PANEL
Anion gap: 7 (ref 5–15)
BUN: 31 mg/dL — AB (ref 6–20)
CALCIUM: 8.2 mg/dL — AB (ref 8.9–10.3)
CO2: 23 mmol/L (ref 22–32)
CREATININE: 0.96 mg/dL (ref 0.61–1.24)
Chloride: 106 mmol/L (ref 101–111)
GFR calc non Af Amer: >60 mL/min (ref >60)
Glucose, Bld: 137 mg/dL — ABNORMAL HIGH (ref 65–99)
Potassium: 4 mmol/L (ref 3.5–5.1)
SODIUM: 136 mmol/L (ref 135–145)

## 2017-05-30 MED ORDER — PREDNISONE 20 MG PO TABS
30.0000 mg | ORAL_TABLET | Freq: Every day | ORAL | Status: DC
  Administered 2017-05-31 – 2017-06-03 (×4): 30 mg via ORAL
  Filled 2017-05-30 (×4): qty 1

## 2017-05-30 MED ORDER — LORAZEPAM 2 MG/ML IJ SOLN
0.5000 mg | Freq: Four times a day (QID) | INTRAMUSCULAR | Status: DC | PRN
  Administered 2017-05-30 – 2017-06-13 (×27): 0.5 mg via INTRAVENOUS
  Filled 2017-05-30 (×28): qty 1

## 2017-05-30 MED ORDER — SODIUM CHLORIDE 0.9 % IV SOLN
INTRAVENOUS | Status: DC | PRN
  Administered 2017-06-02 – 2017-06-12 (×4): via INTRAVENOUS

## 2017-05-30 NOTE — Progress Notes (Signed)
Pt on 15 L HFNC for most of the night. Tolerated well but was placed back on Bipap by RT around 0130 r/t O2 Sats going into the 80s. Pt did well on Bipap, until around 0530 when he removed Bipap himself. Pt O2 sat dropped to low 80s.Pt has been very anxious and increasingly irritated/ restless. MD notified. RN will continue to monitor.

## 2017-05-30 NOTE — Progress Notes (Signed)
Name: Travis Salinas MRN: [id number] DOB: [birth year]    ADMISSION DATE:  05/27/2017 CONSULTATION DATE:  05/27/2017  REFERRING MD :  Dr. Patrecia Pour   CHIEF COMPLAINT:  Hypoxia   HISTORY OF PRESENT ILLNESS:   66 yo male with dyspnea, fever (101.5), chills, weakness, hypoxia from CAP with AECOPD.  Hx of Hep C, HTN, polysubstance abuse.  SUBJECTIVE:  Still intermittently needing Bipap, but less often.  Still feels weak.  Not as much cough.  VITAL SIGNS: Temp:  [97.7 F (36.5 C)-98.5 F (36.9 C)] 97.7 F (36.5 C) (09/21 0345) Pulse Rate:  [79-104] 104 (09/21 0823) Resp:  [12-34] 21 (09/21 0400) BP: (122-158)/(54-110) 158/60 (09/21 0823) SpO2:  [84 %-97 %] 96 % (09/21 0752) FiO2 (%):  [50 %] 50 % (09/21 0049) Weight:  [154 lb 8.7 oz (70.1 kg)] 154 lb 8.7 oz (70.1 kg) (09/21 0345)  PHYSICAL EXAMINATION:  General - pleasant Eyes - pupils reactive ENT - no sinus tenderness, no oral exudate, no LAN Cardiac - regular, no murmur Chest - no wheeze, rales Abd - soft, non tender Ext - no edema Skin - no rashes Neuro - normal strength Psych - normal mood     CMP Latest Ref Rng & Units 05/30/2017 05/29/2017 05/28/2017  Glucose 65 - 99 mg/dL 137(H) 125(H) 140(H)  BUN 6 - 20 mg/dL 31(H) 31(H) 25(H)  Creatinine 0.61 - 1.24 mg/dL 0.96 0.93 1.04  Sodium 135 - 145 mmol/L 136 140 137  Potassium 3.5 - 5.1 mmol/L 4.0 3.8 3.7  Chloride 101 - 111 mmol/L 106 109 106  CO2 22 - 32 mmol/L 23 22 22   Calcium 8.9 - 10.3 mg/dL 8.2(L) 8.2(L) 8.2(L)  Total Protein 6.5 - 8.1 g/dL - - 5.8(L)  Total Bilirubin 0.3 - 1.2 mg/dL - - 0.9  Alkaline Phos 38 - 126 U/L - - 39  AST 15 - 41 U/L - - 50(H)  ALT 17 - 63 U/L - - 12(L)    CBC Latest Ref Rng & Units 05/30/2017 05/29/2017 05/28/2017  WBC 4.0 - 10.5 K/uL 15.6(H) 11.3(H) 6.7  Hemoglobin 13.0 - 17.0 g/dL 10.1(L) 10.8(L) 10.5(L)  Hematocrit 39.0 - 52.0 % 29.2(L) 32.4(L) 30.8(L)  Platelets 150 - 400 K/uL 263 265 216    ABG    Component Value  Date/Time   PHART 7.408 05/28/2017 0015   PCO2ART 36.4 05/28/2017 0015   PO2ART 58.6 (L) 05/28/2017 0015   HCO3 22.6 05/28/2017 0015   TCO2 24 12/07/2015 1403   ACIDBASEDEF 1.3 05/28/2017 0015   O2SAT 89.1 05/28/2017 0015    CBG (last 3)  No results for input(s): GLUCAP in the last 72 hours.   Dg Chest Port 1 View  Result Date: 05/30/2017 CLINICAL DATA:  Community acquired pneumonia. EXAM: PORTABLE CHEST 1 VIEW COMPARISON:  05/29/2017. FINDINGS: Mediastinum hilar structures normal. Rounded density noted over the upper chest of undetermined etiology. This is most likely extrinsic to the patient. Stable cardiomegaly. Diffuse bilateral interstitial infiltrates again noted without interim change. No pleural effusion or pneumothorax . IMPRESSION: 1. Diffuse bilateral from interstitial infiltrates, no change from prior exam. 2. Stable cardiomegaly . Electronically Signed   By: Marcello Moores  Register   On: 05/30/2017 07:03   Dg Chest Port 1 View  Result Date: 05/29/2017 CLINICAL DATA:  Respiratory failure. EXAM: PORTABLE CHEST 1 VIEW COMPARISON:  CT 05/27/2017.  Chest x-ray 05/27/2017. FINDINGS: Mediastinum hilar structures are normal. Heart size normal. Diffuse bilateral pulmonary infiltrates again noted. No significant change. Stable  cardiomegaly. No pleural effusion or pneumothorax. IMPRESSION: 1. Diffuse multifocal bilateral pulmonary infiltrates without significant change. 2. Stable cardiomegaly. Electronically Signed   By: Marcello Moores  Register   On: 05/29/2017 06:10    Drugs of Abuse     Component Value Date/Time   LABOPIA POSITIVE (A) 05/27/2017 0837   COCAINSCRNUR POSITIVE (A) 05/27/2017 0837   LABBENZ NONE DETECTED 05/27/2017 0837   AMPHETMU NONE DETECTED 05/27/2017 0837   THCU NONE DETECTED 05/27/2017 0837   LABBARB NONE DETECTED 05/27/2017 0837     CULTURES: Blood 9/18 >> Pneumococcus RSV 9/18 >> Rhinovirus Influenza PCR 9/18 >> negative  ANTIBIOTICS: Zosyn 9/18 >> 9/19 Zithromax  9/18 >> 9/19 Rocephin 9/19 >>   SIGNIFICANT EVENTS  9/18 > Presents to ED with dyspnea   STUDIES:  V/Q 9/18 > Very low probability for pulmonary embolism (0 - 9%). CT chest 9/18 >> moderate emphysema, b/l patchy consolidation, extensive b/l GGO  DISCUSSION: 66 yo male former smoker with acute hypoxic respiratory failure from PNA, cocaine induced pneumonitis, and COPD exacerbation with emphysema.  PMHx of HTN, Hep C.  ASSESSMENT / PLAN:  Acute hypoxic respiratory failure. AECOPD. Cocaine induced pneumonitis. - oxygen to keep SpO2 > 90% - Bipap prn >> might be able to d/c in next 24 to 48 hrs - d/c solumedrol and change to prednisone 30 mg daily >> wean off over next 5 days as able - scheduled BDs - f/u CXR intermittently to document clearing of infiltrates   Community acquired pneumonia and bacteremia with Pneumococcus, and rhinovirus. Left leg wound. - Day 4 of rocephin  Hx of HTN. - norvasc, ASA, tenormin  Lactic acidosis in setting of sepsis. - KVO IV fluids  Anemia of critical illness. - f/u CBC intermittently  Moderate protein calorie malnutrition. - tolerating diet  Hx of depression. - abilify, klonopin, prozac, lamictal  Deconditioning. - mobilize  DVT prophylaxis - Lovenox SUP - not indicated Nutrition - regular diet Goals of care - full code  Pulmonary plan in place.  PCCM will sign off.  Please call if additional help needed.  Chesley Mires, MD [facility] Pulmonary/Critical Care 05/30/2017, 8:37 AM Pager:  [phone] After 3pm call: [phone]

## 2017-05-30 NOTE — Progress Notes (Signed)
PROGRESS NOTE  Travis Salinas WUJ:[id number] DOB: May 24, 1951 DOA: 05/27/2017 PCP: Kathyrn Lass, MD   LOS: 3 days   Brief Narrative / Interim history: Travis Salinas is a 66 y.o. male with medical history significant of hypertension, COPD, depression/anxiety and BPH who presented to the emergency department complaining of progressive worsening shortness of breath for the past 4 days. Patient was seen by his PCP 4 days ago and was diagnosed with COPD exacerbation. She was placed on doxycycline, prednisone and cough syrup. The morning of admission breathing got acutely worse and he developed fevers and chills.  CT scan showed multifocal pneumonia.  Given profound hypoxia he was admitted to stepdown and PCCM was consulted.  Patient is slow to improve, however improving.  Still requiring BiPAP.  Assessment & Plan: Active Problems:   HTN (hypertension)   Multifocal pneumonia   Hypoxia   Sepsis due to pneumococcus ([place])   Sepsis in the setting of multifocal pneumonia due to Streptococcus pneumoniae -Blood cultures with gram-positive cocci, Streptococcus pneumoniae 1 out of 2 bottles.  ID panel also reflects positive for Streptococcus pneumoniae, which is less likely to be a contaminant.  Surveillance cultures today.  No routine 2D echo is required for Streptococcus pneumonia bacteremia, discussed with ID on 9/20.  Sensitivities show that is sensitive to Levaquin, will transition to that on discharge, meanwhile keep on ceftriaxone -Continue ceftriaxone -Respiratory virus panel sent, came back positive with rhinovirus.  Supportive treatment  COPD exacerbation -In the setting of #1, no further wheezing, continue duo nebs, change Solu-Medrol to prednisone -Critical care followed patient, signed off on 9/21  Acute hypoxic respiratory failure -Due to #1 and #2, on BiPAP, continues to require BiPAP overnight intermittently however less than before -Attempt to wean off as tolerated  AKI on CKD  2 -Acute kidney injury resolved, creatinine is back to his baseline today  HTN -Blood pressure is stable, continue home medications  Depression/anxiety -Continue home medications  BPH -Continue home medications  Polysubstance abuse -UDS positive for cocaine.   DVT prophylaxis: Lovenox Code Status: Full code Family Communication: No family at bedside today Disposition Plan: Remain in stepdown  Consultants:   PCCM-signed off 9/21  Procedures:   BiPAP  Antimicrobials:  Zosyn 9/18 >> 9/19  Ceftriaxone 9/19 >>  Subjective: -His anxious this morning, just got back on BiPAP, no chest pain.  No abdominal pain nausea or vomiting.  Objective: Vitals:   05/30/17 0400 05/30/17 0752 05/30/17 0823 05/30/17 1023  BP: 131/61  (!) 158/60 (!) 158/60  Pulse: 79  (!) 104   Resp: (!) 21     Temp:  97.9 F (36.6 C)    TempSrc:  Oral    SpO2: 96% 96%    Weight:      Height:        Intake/Output Summary (Last 24 hours) at 05/30/17 1212 Last data filed at 05/30/17 0800  Gross per 24 hour  Intake              850 ml  Output             1700 ml  Net             -850 ml   Filed Weights   05/28/17 0815 05/30/17 0345  Weight: 68 kg (149 lb 14.6 oz) 70.1 kg (154 lb 8.7 oz)    Examination:  Vitals:   05/30/17 0400 05/30/17 0752 05/30/17 0823 05/30/17 1023  BP: 131/61  (!) 158/60 Marland Kitchen)  158/60  Pulse: 79  (!) 104   Resp: (!) 21     Temp:  97.9 F (36.6 C)    TempSrc:  Oral    SpO2: 96% 96%    Weight:      Height:        Constitutional: Anxious Eyes: lids and conjunctivae normal Neck: normal, supple Respiratory: Rhonchi bilaterally, coarse breath sounds, no wheezing.  Increased respiratory effort Cardiovascular: Regular rate and rhythm, no murmurs / rubs / gallops. No LE edema. 2+ pedal pulses.  Abdomen: no tenderness. Bowel sounds positive.  Skin: no rashes, lesions, ulcers. No induration Neurologic: non focal     Data Reviewed: I have independently reviewed  following labs and imaging studies   CBC:  Recent Labs Lab 05/27/17 0419 05/28/17 0350 05/29/17 0741 05/30/17 0307  WBC 8.0 6.7 11.3* 15.6*  NEUTROABS 7.4  --   --   --   HGB 12.5* 10.5* 10.8* 10.1*  HCT 36.2* 30.8* 32.4* 29.2*  MCV 81.7 81.9 81.0 80.9  PLT 255 216 265 465   Basic Metabolic Panel:  Recent Labs Lab 05/27/17 0419 05/28/17 0350 05/29/17 0741 05/30/17 0307  NA 134* 137 140 136  K 3.4* 3.7 3.8 4.0  CL 100* 106 109 106  CO2 22 22 22 23   GLUCOSE 121* 140* 125* 137*  BUN 25* 25* 31* 31*  CREATININE 1.40* 1.04 0.93 0.96  CALCIUM 9.8 8.2* 8.2* 8.2*  MG  --  1.9  --   --   PHOS  --  3.1  --   --    GFR: Estimated Creatinine Clearance: 76.1 mL/min (by C-G formula based on SCr of 0.96 mg/dL). Liver Function Tests:  Recent Labs Lab 05/27/17 0419 05/28/17 0350  AST 49* 50*  ALT 15* 12*  ALKPHOS 49 39  BILITOT 0.7 0.9  PROT 7.3 5.8*  ALBUMIN 3.8 2.7*   No results for input(s): LIPASE, AMYLASE in the last 168 hours. No results for input(s): AMMONIA in the last 168 hours. Coagulation Profile: No results for input(s): INR, PROTIME in the last 168 hours. Cardiac Enzymes:  Recent Labs Lab 05/27/17 1648 05/27/17 2239 05/28/17 0350  TROPONINI <0.03 <0.03 <0.03   BNP (last 3 results) No results for input(s): PROBNP in the last 8760 hours. HbA1C: No results for input(s): HGBA1C in the last 72 hours. CBG: No results for input(s): GLUCAP in the last 168 hours. Lipid Profile: No results for input(s): CHOL, HDL, LDLCALC, TRIG, CHOLHDL, LDLDIRECT in the last 72 hours. Thyroid Function Tests: No results for input(s): TSH, T4TOTAL, FREET4, T3FREE, THYROIDAB in the last 72 hours. Anemia Panel: No results for input(s): VITAMINB12, FOLATE, FERRITIN, TIBC, IRON, RETICCTPCT in the last 72 hours. Urine analysis:    Component Value Date/Time   COLORURINE YELLOW 05/27/2017 0837   APPEARANCEUR CLEAR 05/27/2017 0837   LABSPEC 1.014 05/27/2017 0837   PHURINE  7.0 05/27/2017 0837   GLUCOSEU NEGATIVE 05/27/2017 0837   HGBUR NEGATIVE 05/27/2017 0837   BILIRUBINUR NEGATIVE 05/27/2017 0837   KETONESUR NEGATIVE 05/27/2017 0837   PROTEINUR 100 (A) 05/27/2017 0837   UROBILINOGEN 1.0 11/14/2013 1424   NITRITE NEGATIVE 05/27/2017 0837   LEUKOCYTESUR NEGATIVE 05/27/2017 0837   Sepsis Labs: Invalid input(s): PROCALCITONIN, LACTICIDVEN  Recent Results (from the past 240 hour(s))  Blood culture (routine x 2)     Status: None (Preliminary result)   Collection Time: 05/27/17  4:30 AM  Result Value Ref Range Status   Specimen Description BLOOD LEFT ARM  Final  Special Requests   Final    BOTTLES DRAWN AEROBIC AND ANAEROBIC Blood Culture adequate volume   Culture   Final    NO GROWTH 2 DAYS Performed at [facility], [place] [address], [place],  [place] [address]    Report Status PENDING  Incomplete  Blood culture (routine x 2)     Status: Abnormal   Collection Time: 05/27/17  4:34 AM  Result Value Ref Range Status   Specimen Description BLOOD RIGHT FOREARM  Final   Special Requests   Final    BOTTLES DRAWN AEROBIC AND ANAEROBIC Blood Culture adequate volume   Culture  Setup Time   Final    GRAM POSITIVE COCCI ANAEROBIC BOTTLE ONLY CRITICAL RESULT CALLED TO, READ BACK BY AND VERIFIED WITH: Damian Leavell [id number] MLM Performed at [facility], [place] [address], [place], [place] [address]    Culture STREPTOCOCCUS PNEUMONIAE (A)  Final   Report Status 05/30/2017 FINAL  Final   Organism ID, Bacteria STREPTOCOCCUS PNEUMONIAE  Final      Susceptibility   Streptococcus pneumoniae - MIC*    ERYTHROMYCIN >=8 RESISTANT Resistant     LEVOFLOXACIN 1 SENSITIVE Sensitive     PENICILLIN (meningitis) 0.12 RESISTANT Resistant     PENICILLIN (non-meningitis) 0.12 SENSITIVE Sensitive     CEFTRIAXONE (non-meningitis) 0.25 SENSITIVE Sensitive     CEFTRIAXONE (meningitis) 0.25 SENSITIVE Sensitive     * STREPTOCOCCUS PNEUMONIAE  Blood Culture ID Panel  (Reflexed)     Status: Abnormal   Collection Time: 05/27/17  4:34 AM  Result Value Ref Range Status   Enterococcus species NOT DETECTED NOT DETECTED Final   Vancomycin resistance NOT DETECTED NOT DETECTED Final   Listeria monocytogenes NOT DETECTED NOT DETECTED Final   Staphylococcus species NOT DETECTED NOT DETECTED Final   Staphylococcus aureus NOT DETECTED NOT DETECTED Final   Methicillin resistance NOT DETECTED NOT DETECTED Final   Streptococcus species DETECTED (A) NOT DETECTED Final    Comment: CRITICAL RESULT CALLED TO, READ BACK BY AND VERIFIED WITH: PHARMD J GADHIA [id number] MLM    Streptococcus agalactiae NOT DETECTED NOT DETECTED Final   Streptococcus pneumoniae DETECTED (A) NOT DETECTED Final    Comment: CRITICAL RESULT CALLED TO, READ BACK BY AND VERIFIED WITH: PHARMD J GADHIA [id number] 0815 MLM    Streptococcus pyogenes NOT DETECTED NOT DETECTED Final   Acinetobacter baumannii NOT DETECTED NOT DETECTED Final   Enterobacteriaceae species NOT DETECTED NOT DETECTED Final   Enterobacter cloacae complex NOT DETECTED NOT DETECTED Final   Escherichia coli NOT DETECTED NOT DETECTED Final   Klebsiella oxytoca NOT DETECTED NOT DETECTED Final   Klebsiella pneumoniae NOT DETECTED NOT DETECTED Final   Proteus species NOT DETECTED NOT DETECTED Final   Serratia marcescens NOT DETECTED NOT DETECTED Final   Carbapenem resistance NOT DETECTED NOT DETECTED Final   Haemophilus influenzae NOT DETECTED NOT DETECTED Final   Neisseria meningitidis NOT DETECTED NOT DETECTED Final   Pseudomonas aeruginosa NOT DETECTED NOT DETECTED Final   Candida albicans NOT DETECTED NOT DETECTED Final   Candida glabrata NOT DETECTED NOT DETECTED Final   Candida krusei NOT DETECTED NOT DETECTED Final   Candida parapsilosis NOT DETECTED NOT DETECTED Final   Candida tropicalis NOT DETECTED NOT DETECTED Final    Comment: Performed at [facility], [place]. [address] [place],  [address]  MRSA PCR  Screening     Status: None   Collection Time: 05/27/17  6:17 PM  Result Value  Ref Range Status   MRSA by PCR NEGATIVE NEGATIVE Final    Comment:        The GeneXpert MRSA Assay (FDA approved for NASAL specimens only), is one component of a comprehensive MRSA colonization surveillance program. It is not intended to diagnose MRSA infection nor to guide or monitor treatment for MRSA infections.   Respiratory Panel by PCR     Status: Abnormal   Collection Time: 05/27/17 10:55 PM  Result Value Ref Range Status   Adenovirus NOT DETECTED NOT DETECTED Final   Coronavirus 229E NOT DETECTED NOT DETECTED Final   Coronavirus HKU1 NOT DETECTED NOT DETECTED Final   Coronavirus NL63 NOT DETECTED NOT DETECTED Final   Coronavirus OC43 NOT DETECTED NOT DETECTED Final   Metapneumovirus NOT DETECTED NOT DETECTED Final   Rhinovirus / Enterovirus DETECTED (A) NOT DETECTED Final   Influenza A NOT DETECTED NOT DETECTED Final   Influenza A H1 NOT DETECTED NOT DETECTED Final   Influenza A H1 2009 NOT DETECTED NOT DETECTED Final   Influenza A H3 NOT DETECTED NOT DETECTED Final   Influenza B NOT DETECTED NOT DETECTED Final   Parainfluenza Virus 1 NOT DETECTED NOT DETECTED Final   Parainfluenza Virus 2 NOT DETECTED NOT DETECTED Final   Parainfluenza Virus 3 NOT DETECTED NOT DETECTED Final   Parainfluenza Virus 4 NOT DETECTED NOT DETECTED Final   Respiratory Syncytial Virus NOT DETECTED NOT DETECTED Final   Bordetella pertussis NOT DETECTED NOT DETECTED Final   Chlamydophila pneumoniae NOT DETECTED NOT DETECTED Final   Mycoplasma pneumoniae NOT DETECTED NOT DETECTED Final    Comment: Performed at [facility], [place]. [address] [place], [place] [address]      Radiology Studies: Dg Chest Port 1 View  Result Date: 05/30/2017 CLINICAL DATA:  Community acquired pneumonia. EXAM: PORTABLE CHEST 1 VIEW COMPARISON:  05/29/2017. FINDINGS: Mediastinum hilar structures normal. Rounded density noted over the  upper chest of undetermined etiology. This is most likely extrinsic to the patient. Stable cardiomegaly. Diffuse bilateral interstitial infiltrates again noted without interim change. No pleural effusion or pneumothorax . IMPRESSION: 1. Diffuse bilateral from interstitial infiltrates, no change from prior exam. 2. Stable cardiomegaly . Electronically Signed   By: Marcello Moores  Register   On: 05/30/2017 07:03   Dg Chest Port 1 View  Result Date: 05/29/2017 CLINICAL DATA:  Respiratory failure. EXAM: PORTABLE CHEST 1 VIEW COMPARISON:  CT 05/27/2017.  Chest x-ray 05/27/2017. FINDINGS: Mediastinum hilar structures are normal. Heart size normal. Diffuse bilateral pulmonary infiltrates again noted. No significant change. Stable cardiomegaly. No pleural effusion or pneumothorax. IMPRESSION: 1. Diffuse multifocal bilateral pulmonary infiltrates without significant change. 2. Stable cardiomegaly. Electronically Signed   By: Marcello Moores  Register   On: 05/29/2017 06:10     Scheduled Meds: . amLODipine  10 mg Oral q morning - 10a  . ARIPiprazole  20 mg Oral Daily  . aspirin EC  81 mg Oral Daily  . atenolol  25 mg Oral BH-q7a  . clonazePAM  0.5 mg Oral BID  . dextromethorphan-guaiFENesin  1 tablet Oral BID  . enoxaparin (LOVENOX) injection  40 mg Subcutaneous Q24H  . feeding supplement (ENSURE ENLIVE)  237 mL Oral BID BM  . FLUoxetine  20 mg Oral Daily  . ipratropium-albuterol  3 mL Nebulization Q6H  . lamoTRIgine  200 mg Oral q morning - 10a  . predniSONE  30 mg Oral Q breakfast  . tamsulosin  0.4 mg Oral Daily   Continuous Infusions: . sodium chloride 10  mL/hr at 05/30/17 1028  . cefTRIAXone (ROCEPHIN)  IV 2 g (05/30/17 1022)    Marzetta Board, MD, PhD [facility] Pager [phone]  If 7PM-7AM, please contact night-coverage [email or web address] Password [email or web address] 05/30/2017, 12:12 PM

## 2017-05-31 ENCOUNTER — Inpatient Hospital Stay (HOSPITAL_COMMUNITY): Payer: 59

## 2017-05-31 DIAGNOSIS — I361 Nonrheumatic tricuspid (valve) insufficiency: Secondary | ICD-10-CM

## 2017-05-31 DIAGNOSIS — R0902 Hypoxemia: Secondary | ICD-10-CM

## 2017-05-31 LAB — BLOOD GAS, ARTERIAL
Acid-Base Excess: 2.1 mmol/L — ABNORMAL HIGH (ref 0.0–2.0)
BICARBONATE: 25.1 mmol/L (ref 20.0–28.0)
DRAWN BY: 105521
Delivery systems: POSITIVE
Expiratory PAP: 7
FIO2: 100
INSPIRATORY PAP: 14
LHR: 18 {breaths}/min
MODE: POSITIVE
O2 Saturation: 98.7 %
PATIENT TEMPERATURE: 98.6
PCO2 ART: 35.3 mmHg (ref 32.0–48.0)
pH, Arterial: 7.467 — ABNORMAL HIGH (ref 7.350–7.450)
pO2, Arterial: 137 mmHg — ABNORMAL HIGH (ref 83.0–108.0)

## 2017-05-31 LAB — COMPREHENSIVE METABOLIC PANEL
ALBUMIN: 2.5 g/dL — AB (ref 3.5–5.0)
ALT: 18 U/L (ref 17–63)
ANION GAP: 9 (ref 5–15)
AST: 47 U/L — AB (ref 15–41)
Alkaline Phosphatase: 66 U/L (ref 38–126)
BUN: 25 mg/dL — AB (ref 6–20)
CHLORIDE: 106 mmol/L (ref 101–111)
CO2: 24 mmol/L (ref 22–32)
Calcium: 8.5 mg/dL — ABNORMAL LOW (ref 8.9–10.3)
Creatinine, Ser: 0.87 mg/dL (ref 0.61–1.24)
GFR calc Af Amer: >60 mL/min (ref >60)
GFR calc non Af Amer: >60 mL/min (ref >60)
GLUCOSE: 119 mg/dL — AB (ref 65–99)
POTASSIUM: 3.3 mmol/L — AB (ref 3.5–5.1)
SODIUM: 139 mmol/L (ref 135–145)
Total Bilirubin: 0.6 mg/dL (ref 0.3–1.2)
Total Protein: 5.6 g/dL — ABNORMAL LOW (ref 6.5–8.1)

## 2017-05-31 LAB — CBC
HCT: 30.9 % — ABNORMAL LOW (ref 39.0–52.0)
HEMOGLOBIN: 10.5 g/dL — AB (ref 13.0–17.0)
MCH: 27.6 pg (ref 26.0–34.0)
MCHC: 34 g/dL (ref 30.0–36.0)
MCV: 81.1 fL (ref 78.0–100.0)
Platelets: 291 10*3/uL (ref 150–400)
RBC: 3.81 MIL/uL — ABNORMAL LOW (ref 4.22–5.81)
RDW: 14.3 % (ref 11.5–15.5)
WBC: 15.3 10*3/uL — ABNORMAL HIGH (ref 4.0–10.5)

## 2017-05-31 LAB — PROCALCITONIN: Procalcitonin: 0.75 ng/mL

## 2017-05-31 LAB — ECHOCARDIOGRAM COMPLETE
Height: 72 in
Weight: 2444.46 oz

## 2017-05-31 MED ORDER — FUROSEMIDE 10 MG/ML IJ SOLN
INTRAMUSCULAR | Status: AC
  Filled 2017-05-31: qty 4

## 2017-05-31 MED ORDER — FUROSEMIDE 10 MG/ML IJ SOLN
40.0000 mg | Freq: Once | INTRAMUSCULAR | Status: AC
  Administered 2017-05-31: 40 mg via INTRAVENOUS

## 2017-05-31 NOTE — Progress Notes (Signed)
  Echocardiogram 2D Echocardiogram has been performed.  Travis Salinas 05/31/2017, 10:51 AM

## 2017-05-31 NOTE — Progress Notes (Signed)
Patient complaining having a difficulty breathing, on Bipap O2 Sats 88%-90%. Administered 100mg  of fentanyl given per order. Notified MD Gherghe in regards to respiratory distress. MD Gherghe ordered to place STAT chest x-ray, increase FIO2 from 50 to 100%. Notified MD Sood with PCCM, MD [facility] ordered for 40mg  of Lasix IV. Will continue to monitor and assess. After interventions, patient's respiratory status has improved slightly.

## 2017-05-31 NOTE — Progress Notes (Signed)
Pt came off bipap at 9am  And placed on 8L HF and non rebreather at 15L.  Pt a mouth breather and the non rebreather was loose on face.  Stayed off BIPAP till 1445 pt was aggitated and working hard to breath.

## 2017-05-31 NOTE — Progress Notes (Signed)
This note also relates to the following rows which could not be included: SpO2 - Cannot attach notes to unvalidated device data  Upon entry, RT found pt on HFNC at 10LPM and a PRB at 15lpm and sat at 98%.  RT will try to titrate as tolerated.  Pt wife states he has been off bipap since 530pm.

## 2017-05-31 NOTE — Progress Notes (Signed)
Name: Travis Salinas MRN: [id number] DOB: 20-Oct-1950    ADMISSION DATE:  05/27/2017 CONSULTATION DATE:  05/27/2017  REFERRING MD :  Dr. Patrecia Pour   CHIEF COMPLAINT:  Hypoxia   HISTORY OF PRESENT ILLNESS:   66 yo male with dyspnea, fever (101.5), chills, weakness, hypoxia from CAP with AECOPD.  Hx of Hep C, HTN, polysubstance abuse.  SUBJECTIVE:  Developed worsening shortness of breath this morning.  More hypoxic.  Placed on Bipap and given lasix.  VITAL SIGNS: BP (!) 168/64   Pulse 92   Temp 100.3 F (37.9 C) (Axillary)   Resp (!) 23   Ht 6' (1.829 m)   Wt 152 lb 12.5 oz (69.3 kg)   SpO2 92%   BMI 20.72 kg/m   INTAKE/OUTPUT: I/O last 3 completed shifts: In: 870.3 [P.O.:10; I.V.:810.3; IV Piggyback:50] Out: 2950 [Urine:2950]  PHYSICAL EXAMINATION:  General - alert Eyes - pupils reactive ENT - Bipap mask on Cardiac - regular, no murmur Chest - faint b/l wheeze Abd - soft, non tender Ext - no edema Skin - no rashes Neuro - normal strength      CMP Latest Ref Rng & Units 05/31/2017 05/30/2017 05/29/2017  Glucose 65 - 99 mg/dL 119(H) 137(H) 125(H)  BUN 6 - 20 mg/dL 25(H) 31(H) 31(H)  Creatinine 0.61 - 1.24 mg/dL 0.87 0.96 0.93  Sodium 135 - 145 mmol/L 139 136 140  Potassium 3.5 - 5.1 mmol/L 3.3(L) 4.0 3.8  Chloride 101 - 111 mmol/L 106 106 109  CO2 22 - 32 mmol/L 24 23 22   Calcium 8.9 - 10.3 mg/dL 8.5(L) 8.2(L) 8.2(L)  Total Protein 6.5 - 8.1 g/dL 5.6(L) - -  Total Bilirubin 0.3 - 1.2 mg/dL 0.6 - -  Alkaline Phos 38 - 126 U/L 66 - -  AST 15 - 41 U/L 47(H) - -  ALT 17 - 63 U/L 18 - -    CBC Latest Ref Rng & Units 05/31/2017 05/30/2017 05/29/2017  WBC 4.0 - 10.5 K/uL 15.3(H) 15.6(H) 11.3(H)  Hemoglobin 13.0 - 17.0 g/dL 10.5(L) 10.1(L) 10.8(L)  Hematocrit 39.0 - 52.0 % 30.9(L) 29.2(L) 32.4(L)  Platelets 150 - 400 K/uL 291 263 265    ABG    Component Value Date/Time   PHART 7.467 (H) 05/31/2017 0820   PCO2ART 35.3 05/31/2017 0820   PO2ART 137 (H)  05/31/2017 0820   HCO3 25.1 05/31/2017 0820   TCO2 24 12/07/2015 1403   ACIDBASEDEF 1.3 05/28/2017 0015   O2SAT 98.7 05/31/2017 0820    CBG (last 3)  No results for input(s): GLUCAP in the last 72 hours.   Dg Chest Port 1 View  Result Date: 05/31/2017 CLINICAL DATA:  Worsening shortness of breath EXAM: PORTABLE CHEST 1 VIEW COMPARISON:  Chest x-rays dated 05/30/2017 and 05/29/2017. FINDINGS: Heart size and mediastinal contours are within normal limits. Atherosclerotic changes noted at the aortic arch. The diffuse bilateral opacities are not significantly changed. No new lung findings. No pleural effusion or pneumothorax seen. No acute or suspicious osseous finding. IMPRESSION: 1. Stable chest x-ray. Diffuse bilateral lung opacities are unchanged, predominantly interstitial, corresponding to the diffuse ground-glass opacities demonstrated on chest CT of 05/27/2017, presumably multifocal pneumonia. 2. Aortic atherosclerosis. Electronically Signed   By: Franki Cabot M.D.   On: 05/31/2017 08:16   Dg Chest Port 1 View  Result Date: 05/30/2017 CLINICAL DATA:  Community acquired pneumonia. EXAM: PORTABLE CHEST 1 VIEW COMPARISON:  05/29/2017. FINDINGS: Mediastinum hilar structures normal. Rounded density noted over the upper chest  of undetermined etiology. This is most likely extrinsic to the patient. Stable cardiomegaly. Diffuse bilateral interstitial infiltrates again noted without interim change. No pleural effusion or pneumothorax . IMPRESSION: 1. Diffuse bilateral from interstitial infiltrates, no change from prior exam. 2. Stable cardiomegaly . Electronically Signed   By: Marcello Moores  Register   On: 05/30/2017 07:03    Drugs of Abuse     Component Value Date/Time   LABOPIA POSITIVE (A) 05/27/2017 0837   COCAINSCRNUR POSITIVE (A) 05/27/2017 0837   LABBENZ NONE DETECTED 05/27/2017 0837   AMPHETMU NONE DETECTED 05/27/2017 0837   THCU NONE DETECTED 05/27/2017 0837   LABBARB NONE DETECTED 05/27/2017  0837     CULTURES: Blood 9/18 >> Pneumococcus RSV 9/18 >> Rhinovirus Influenza PCR 9/18 >> negative  ANTIBIOTICS: Zosyn 9/18 >> 9/19 Zithromax 9/18 >> 9/19 Rocephin 9/19 >>   SIGNIFICANT EVENTS  9/18 Presents to ED with dyspnea   STUDIES:  V/Q 9/18 > Very low probability for pulmonary embolism (0 - 9%). CT chest 9/18 >> moderate emphysema, b/l patchy consolidation, extensive b/l GGO  DISCUSSION: 66 yo male former smoker with acute hypoxic respiratory failure from PNA, cocaine induced pneumonitis, and COPD exacerbation with emphysema.  PMHx of HTN, Hep C.  Worsening dyspnea, hypoxia 9/22 likely from acute diastolic dysfunction and pulmonary edema.  Improved with Bipap and diuresis.  ASSESSMENT / PLAN:  Acute hypoxic respiratory failure. AECOPD. Cocaine induced pneumonitis. - oxygen to keep SpO2 > 90% - Bipap qhs and prn during the day - continue prednisone 30 mg daily - scheduled BDs  Acute diastolic CHF 5/85 with acute pulmonary edema. - negative fluid balance as able - check Echo - continue ASA, norvasc, tenormin  Community acquired pneumonia and bacteremia with Pneumococcus, and rhinovirus. Left leg wound. - Day 5 of rocephin  Lactic acidosis in setting of sepsis. - resolved  Anemia of critical illness. - f/u CBC intermittently  Moderate protein calorie malnutrition. - tolerating diet  Hx of depression. - abilify, klonopin, prozac, lamictal  Deconditioning. - mobilize  DVT prophylaxis - Lovenox SUP - not indicated Nutrition - regular diet Goals of care - full code  CC time 31 minutes  Chesley Mires, MD [place] [facility] 05/31/2017, 8:51 AM Pager:  [phone] After 3pm call: [phone]

## 2017-05-31 NOTE — Progress Notes (Signed)
RN asked that RT come and eval pt.who wants to come off bipap.  Entered room and pt. Is agitated and states that he only had a coughing spell but feels like he isnt getting enough air.  Sats 93 on Bipap.  Suggested to RN that we could take mask off but RN states that when pt. removed mask earlier that he desated into the 49s.  Explained to pt. that he would need to remain on bipap at this time because of failed attempt to remove mask by nursing.

## 2017-05-31 NOTE — Progress Notes (Signed)
PROGRESS NOTE  Travis Salinas ZOX:[id number] DOB: Sep 05, 1951 DOA: 05/27/2017 PCP: Kathyrn Lass, MD   LOS: 4 days   Brief Narrative / Interim history: Travis Salinas is a 66 y.o. male with medical history significant of hypertension, COPD, depression/anxiety and BPH who presented to the emergency department complaining of progressive worsening shortness of breath for the past 4 days. Patient was seen by his PCP 4 days ago and was diagnosed with COPD exacerbation. She was placed on doxycycline, prednisone and cough syrup. The morning of admission breathing got acutely worse and he developed fevers and chills.  CT scan showed multifocal pneumonia.  Given profound hypoxia he was admitted to stepdown and PCCM was consulted.  Patient is slow to improve, however improving.  Still requiring BiPAP.  Assessment & Plan: Active Problems:   HTN (hypertension)   Multifocal pneumonia   Hypoxia   Sepsis due to pneumococcus ([place])   Sepsis in the setting of multifocal pneumonia due to Streptococcus pneumoniae -Blood cultures with gram-positive cocci, Streptococcus pneumoniae 1 out of 2 bottles.  ID panel also reflects positive for Streptococcus pneumoniae, which is less likely to be a contaminant.  Surveillance cultures today.  No routine 2D echo is required for Streptococcus pneumonia bacteremia, discussed with ID on 9/20.  Sensitivities show that is sensitive to Levaquin, will transition to that on discharge, meanwhile keep on ceftriaxone -Respiratory virus panel sent, came back positive with rhinovirus.  Supportive treatment -Febrile again today, will repeat blood cultures, if persistent bacteremia will consult infectious disease  COPD exacerbation -In the setting of #1, no further wheezing, continue duo nebs, change Solu-Medrol to prednisone -Critical care followed patient, signed off on 9/21, re-consulted 9/22 due to worsening respiratory status  Acute hypoxic respiratory failure -Due to #1 and  #2, on BiPAP, continues to require BiPAP intermittently -Attempt to wean off oxygen failed, started desatting again this morning requiring increase to 100%.  PCCM notified.  Given Lasix due to suspected pulmonary edema, chest x-ray however was essentially unchanged  AKI on CKD 2 -Acute kidney injury resolved, creatinine is back to his baseline  HTN -Blood pressure is stable, continue home medications  Depression/anxiety -Continue home medications  BPH -Continue home medications  Polysubstance abuse -UDS positive for cocaine.   DVT prophylaxis: Lovenox Code Status: Full code Family Communication: No family at bedside today Disposition Plan: Remain in stepdown  Consultants:   PCCM-signed off 9/21  Procedures:   BiPAP  Antimicrobials:  Zosyn 9/18 >> 9/19  Ceftriaxone 9/19 >>  Subjective: -He is working hard to breathe this morning, appears anxious  Objective: Vitals:   05/31/17 0740 05/31/17 0800 05/31/17 0900 05/31/17 1108  BP:  (!) 163/81  (!) 163/74  Pulse: (!) 117 (!) 113 (!) 104 (!) 120  Resp: (!) 30 (!) 23 (!) 23   Temp:      TempSrc:      SpO2: 92% 90% 100%   Weight:      Height:        Intake/Output Summary (Last 24 hours) at 05/31/17 1148 Last data filed at 05/31/17 1115  Gross per 24 hour  Intake               90 ml  Output             3900 ml  Net            -3810 ml   Filed Weights   05/28/17 0815 05/30/17 0345 05/31/17 0404  Weight: 68  kg (149 lb 14.6 oz) 70.1 kg (154 lb 8.7 oz) 69.3 kg (152 lb 12.5 oz)    Examination:  Vitals:   05/31/17 0740 05/31/17 0800 05/31/17 0900 05/31/17 1108  BP:  (!) 163/81  (!) 163/74  Pulse: (!) 117 (!) 113 (!) 104 (!) 120  Resp: (!) 30 (!) 23 (!) 23   Temp:      TempSrc:      SpO2: 92% 90% 100%   Weight:      Height:       Constitutional: Appears in distress, increased work of breathing Eyes: PERRL, lids and conjunctivae normal ENMT: Mucous membranes are moist.  Neck: normal,  supple Respiratory: Rhonchi bilaterally, very coarse breath sounds, no wheezing.  Increased respiratory effort.  Cardiovascular: Regular rate and rhythm, no murmurs / rubs / gallops. No LE edema.  Abdomen: no tenderness. Bowel sounds positive.  Neurologic: non focal   Data Reviewed: I have independently reviewed following labs and imaging studies   CBC:  Recent Labs Lab 05/27/17 0419 05/28/17 0350 05/29/17 0741 05/30/17 0307 05/31/17 0306  WBC 8.0 6.7 11.3* 15.6* 15.3*  NEUTROABS 7.4  --   --   --   --   HGB 12.5* 10.5* 10.8* 10.1* 10.5*  HCT 36.2* 30.8* 32.4* 29.2* 30.9*  MCV 81.7 81.9 81.0 80.9 81.1  PLT 255 216 265 263 834   Basic Metabolic Panel:  Recent Labs Lab 05/27/17 0419 05/28/17 0350 05/29/17 0741 05/30/17 0307 05/31/17 0306  NA 134* 137 140 136 139  K 3.4* 3.7 3.8 4.0 3.3*  CL 100* 106 109 106 106  CO2 22 22 22 23 24   GLUCOSE 121* 140* 125* 137* 119*  BUN 25* 25* 31* 31* 25*  CREATININE 1.40* 1.04 0.93 0.96 0.87  CALCIUM 9.8 8.2* 8.2* 8.2* 8.5*  MG  --  1.9  --   --   --   PHOS  --  3.1  --   --   --    GFR: Estimated Creatinine Clearance: 83 mL/min (by C-G formula based on SCr of 0.87 mg/dL). Liver Function Tests:  Recent Labs Lab 05/27/17 0419 05/28/17 0350 05/31/17 0306  AST 49* 50* 47*  ALT 15* 12* 18  ALKPHOS 49 39 66  BILITOT 0.7 0.9 0.6  PROT 7.3 5.8* 5.6*  ALBUMIN 3.8 2.7* 2.5*   No results for input(s): LIPASE, AMYLASE in the last 168 hours. No results for input(s): AMMONIA in the last 168 hours. Coagulation Profile: No results for input(s): INR, PROTIME in the last 168 hours. Cardiac Enzymes:  Recent Labs Lab 05/27/17 1648 05/27/17 2239 05/28/17 0350  TROPONINI <0.03 <0.03 <0.03   BNP (last 3 results) No results for input(s): PROBNP in the last 8760 hours. HbA1C: No results for input(s): HGBA1C in the last 72 hours. CBG: No results for input(s): GLUCAP in the last 168 hours. Lipid Profile: No results for input(s):  CHOL, HDL, LDLCALC, TRIG, CHOLHDL, LDLDIRECT in the last 72 hours. Thyroid Function Tests: No results for input(s): TSH, T4TOTAL, FREET4, T3FREE, THYROIDAB in the last 72 hours. Anemia Panel: No results for input(s): VITAMINB12, FOLATE, FERRITIN, TIBC, IRON, RETICCTPCT in the last 72 hours. Urine analysis:    Component Value Date/Time   COLORURINE YELLOW 05/27/2017 [place] 05/27/2017 0837   LABSPEC 1.014 05/27/2017 [place] 7.0 05/27/2017 [place] 05/27/2017 [place] 05/27/2017 [place] 05/27/2017 [place] 05/27/2017 1962  PROTEINUR 100 (A) 05/27/2017 0837   UROBILINOGEN 1.0 11/14/2013 1424   NITRITE NEGATIVE 05/27/2017 0837   LEUKOCYTESUR NEGATIVE 05/27/2017 0837   Sepsis Labs: Invalid input(s): PROCALCITONIN, LACTICIDVEN  Recent Results (from the past 240 hour(s))  Blood culture (routine x 2)     Status: None (Preliminary result)   Collection Time: 05/27/17  4:30 AM  Result Value Ref Range Status   Specimen Description BLOOD LEFT ARM  Final   Special Requests   Final    BOTTLES DRAWN AEROBIC AND ANAEROBIC Blood Culture adequate volume   Culture   Final    NO GROWTH 4 DAYS Performed at [facility], [address] [place], [place] [address]    Report Status PENDING  Incomplete  Blood culture (routine x 2)     Status: Abnormal   Collection Time: 05/27/17  4:34 AM  Result Value Ref Range Status   Specimen Description BLOOD RIGHT FOREARM  Final   Special Requests   Final    BOTTLES DRAWN AEROBIC AND ANAEROBIC Blood Culture adequate volume   Culture  Setup Time   Final    GRAM POSITIVE COCCI ANAEROBIC BOTTLE ONLY CRITICAL RESULT CALLED TO, READ BACK BY AND VERIFIED WITH: Damian Leavell [id number] MLM Performed at [facility], [place] [address], [place], [place] [address]    Culture STREPTOCOCCUS PNEUMONIAE (A)  Final   Report Status 05/30/2017 FINAL  Final   Organism  ID, Bacteria STREPTOCOCCUS PNEUMONIAE  Final      Susceptibility   Streptococcus pneumoniae - MIC*    ERYTHROMYCIN >=8 RESISTANT Resistant     LEVOFLOXACIN 1 SENSITIVE Sensitive     PENICILLIN (meningitis) 0.12 RESISTANT Resistant     PENICILLIN (non-meningitis) 0.12 SENSITIVE Sensitive     CEFTRIAXONE (non-meningitis) 0.25 SENSITIVE Sensitive     CEFTRIAXONE (meningitis) 0.25 SENSITIVE Sensitive     * STREPTOCOCCUS PNEUMONIAE  Blood Culture ID Panel (Reflexed)     Status: Abnormal   Collection Time: 05/27/17  4:34 AM  Result Value Ref Range Status   Enterococcus species NOT DETECTED NOT DETECTED Final   Vancomycin resistance NOT DETECTED NOT DETECTED Final   Listeria monocytogenes NOT DETECTED NOT DETECTED Final   Staphylococcus species NOT DETECTED NOT DETECTED Final   Staphylococcus aureus NOT DETECTED NOT DETECTED Final   Methicillin resistance NOT DETECTED NOT DETECTED Final   Streptococcus species DETECTED (A) NOT DETECTED Final    Comment: CRITICAL RESULT CALLED TO, READ BACK BY AND VERIFIED WITH: PHARMD J GADHIA [id number] MLM    Streptococcus agalactiae NOT DETECTED NOT DETECTED Final   Streptococcus pneumoniae DETECTED (A) NOT DETECTED Final    Comment: CRITICAL RESULT CALLED TO, READ BACK BY AND VERIFIED WITH: PHARMD J GADHIA [id number] 0815 MLM    Streptococcus pyogenes NOT DETECTED NOT DETECTED Final   Acinetobacter baumannii NOT DETECTED NOT DETECTED Final   Enterobacteriaceae species NOT DETECTED NOT DETECTED Final   Enterobacter cloacae complex NOT DETECTED NOT DETECTED Final   Escherichia coli NOT DETECTED NOT DETECTED Final   Klebsiella oxytoca NOT DETECTED NOT DETECTED Final   Klebsiella pneumoniae NOT DETECTED NOT DETECTED Final   Proteus species NOT DETECTED NOT DETECTED Final   Serratia marcescens NOT DETECTED NOT DETECTED Final   Carbapenem resistance NOT DETECTED NOT DETECTED Final   Haemophilus influenzae NOT DETECTED NOT DETECTED Final   Neisseria  meningitidis NOT DETECTED NOT DETECTED Final   Pseudomonas aeruginosa NOT DETECTED NOT DETECTED Final   Candida albicans NOT  DETECTED NOT DETECTED Final   Candida glabrata NOT DETECTED NOT DETECTED Final   Candida krusei NOT DETECTED NOT DETECTED Final   Candida parapsilosis NOT DETECTED NOT DETECTED Final   Candida tropicalis NOT DETECTED NOT DETECTED Final    Comment: Performed at [facility], [place] [address], [place], [place] [address]  MRSA PCR Screening     Status: None   Collection Time: 05/27/17  6:17 PM  Result Value Ref Range Status   MRSA by PCR NEGATIVE NEGATIVE Final    Comment:        The GeneXpert MRSA Assay (FDA approved for NASAL specimens only), is one component of a comprehensive MRSA colonization surveillance program. It is not intended to diagnose MRSA infection nor to guide or monitor treatment for MRSA infections.   Respiratory Panel by PCR     Status: Abnormal   Collection Time: 05/27/17 10:55 PM  Result Value Ref Range Status   Adenovirus NOT DETECTED NOT DETECTED Final   Coronavirus 229E NOT DETECTED NOT DETECTED Final   Coronavirus HKU1 NOT DETECTED NOT DETECTED Final   Coronavirus NL63 NOT DETECTED NOT DETECTED Final   Coronavirus OC43 NOT DETECTED NOT DETECTED Final   Metapneumovirus NOT DETECTED NOT DETECTED Final   Rhinovirus / Enterovirus DETECTED (A) NOT DETECTED Final   Influenza A NOT DETECTED NOT DETECTED Final   Influenza A H1 NOT DETECTED NOT DETECTED Final   Influenza A H1 2009 NOT DETECTED NOT DETECTED Final   Influenza A H3 NOT DETECTED NOT DETECTED Final   Influenza B NOT DETECTED NOT DETECTED Final   Parainfluenza Virus 1 NOT DETECTED NOT DETECTED Final   Parainfluenza Virus 2 NOT DETECTED NOT DETECTED Final   Parainfluenza Virus 3 NOT DETECTED NOT DETECTED Final   Parainfluenza Virus 4 NOT DETECTED NOT DETECTED Final   Respiratory Syncytial Virus NOT DETECTED NOT DETECTED Final   Bordetella pertussis NOT DETECTED NOT DETECTED  Final   Chlamydophila pneumoniae NOT DETECTED NOT DETECTED Final   Mycoplasma pneumoniae NOT DETECTED NOT DETECTED Final    Comment: Performed at [facility], [place]. [address], [place], [place] [address]      Radiology Studies: Dg Chest Port 1 View  Result Date: 05/31/2017 CLINICAL DATA:  Worsening shortness of breath EXAM: PORTABLE CHEST 1 VIEW COMPARISON:  Chest x-rays dated 05/30/2017 and 05/29/2017. FINDINGS: Heart size and mediastinal contours are within normal limits. Atherosclerotic changes noted at the aortic arch. The diffuse bilateral opacities are not significantly changed. No new lung findings. No pleural effusion or pneumothorax seen. No acute or suspicious osseous finding. IMPRESSION: 1. Stable chest x-ray. Diffuse bilateral lung opacities are unchanged, predominantly interstitial, corresponding to the diffuse ground-glass opacities demonstrated on chest CT of 05/27/2017, presumably multifocal pneumonia. 2. Aortic atherosclerosis. Electronically Signed   By: Franki Cabot M.D.   On: 05/31/2017 08:16   Dg Chest Port 1 View  Result Date: 05/30/2017 CLINICAL DATA:  Community acquired pneumonia. EXAM: PORTABLE CHEST 1 VIEW COMPARISON:  05/29/2017. FINDINGS: Mediastinum hilar structures normal. Rounded density noted over the upper chest of undetermined etiology. This is most likely extrinsic to the patient. Stable cardiomegaly. Diffuse bilateral interstitial infiltrates again noted without interim change. No pleural effusion or pneumothorax . IMPRESSION: 1. Diffuse bilateral from interstitial infiltrates, no change from prior exam. 2. Stable cardiomegaly . Electronically Signed   By: [place]   On: 05/30/2017 07:03     Scheduled Meds: . amLODipine  10 mg Oral q morning - 10a  .  ARIPiprazole  20 mg Oral Daily  . aspirin EC  81 mg Oral Daily  . atenolol  25 mg Oral BH-q7a  . clonazePAM  0.5 mg Oral BID  . dextromethorphan-guaiFENesin  1 tablet Oral BID  . enoxaparin  (LOVENOX) injection  40 mg Subcutaneous Q24H  . feeding supplement (ENSURE ENLIVE)  237 mL Oral BID BM  . FLUoxetine  20 mg Oral Daily  . ipratropium-albuterol  3 mL Nebulization Q6H  . lamoTRIgine  200 mg Oral q morning - 10a  . predniSONE  30 mg Oral Q breakfast  . tamsulosin  0.4 mg Oral Daily   Continuous Infusions: . sodium chloride 10 mL/hr at 05/30/17 1028  . cefTRIAXone (ROCEPHIN)  IV Stopped (05/31/17 1115)    Marzetta Board, MD, PhD [facility] Pager [phone]  If 7PM-7AM, please contact night-coverage [email or web address] Password [facility] 05/31/2017, 11:48 AM

## 2017-05-31 NOTE — Progress Notes (Signed)
RT left pt on HFNC at 10LPM and removed PRB at this time.  RN aware and RT will continue to monitor.  Plan will be to replace bipap later tonight.

## 2017-06-01 LAB — CBC
HEMATOCRIT: 29.1 % — AB (ref 39.0–52.0)
HEMOGLOBIN: 9.8 g/dL — AB (ref 13.0–17.0)
MCH: 27.6 pg (ref 26.0–34.0)
MCHC: 33.7 g/dL (ref 30.0–36.0)
MCV: 82 fL (ref 78.0–100.0)
Platelets: 308 10*3/uL (ref 150–400)
RBC: 3.55 MIL/uL — AB (ref 4.22–5.81)
RDW: 14 % (ref 11.5–15.5)
WBC: 18.1 10*3/uL — AB (ref 4.0–10.5)

## 2017-06-01 LAB — EXPECTORATED SPUTUM ASSESSMENT W GRAM STAIN, RFLX TO RESP C

## 2017-06-01 LAB — BASIC METABOLIC PANEL
ANION GAP: 8 (ref 5–15)
BUN: 25 mg/dL — AB (ref 6–20)
CHLORIDE: 100 mmol/L — AB (ref 101–111)
CO2: 29 mmol/L (ref 22–32)
Calcium: 8.1 mg/dL — ABNORMAL LOW (ref 8.9–10.3)
Creatinine, Ser: 0.88 mg/dL (ref 0.61–1.24)
Glucose, Bld: 92 mg/dL (ref 65–99)
POTASSIUM: 3.7 mmol/L (ref 3.5–5.1)
SODIUM: 137 mmol/L (ref 135–145)

## 2017-06-01 LAB — CULTURE, BLOOD (ROUTINE X 2)
Culture: NO GROWTH
Special Requests: ADEQUATE

## 2017-06-01 LAB — MAGNESIUM: MAGNESIUM: 2.3 mg/dL (ref 1.7–2.4)

## 2017-06-01 LAB — EXPECTORATED SPUTUM ASSESSMENT W REFEX TO RESP CULTURE

## 2017-06-01 NOTE — Progress Notes (Signed)
Took pt off BIPAP and placed on HF and non rebreather   sats 88-92

## 2017-06-01 NOTE — Progress Notes (Signed)
PROGRESS NOTE  Travis Salinas PJA:[id number] DOB: [birth year] DOA: 05/27/2017 PCP: Kathyrn Lass, MD   LOS: 5 days   Brief Narrative / Interim history: Travis Salinas is a 66 y.o. male with medical history significant of hypertension, COPD, depression/anxiety and BPH who presented to the emergency department complaining of progressive worsening shortness of breath for the past 4 days. Patient was seen by his PCP 4 days ago and was diagnosed with COPD exacerbation. She was placed on doxycycline, prednisone and cough syrup. The morning of admission breathing got acutely worse and he developed fevers and chills.  CT scan showed multifocal pneumonia.  Given profound hypoxia he was admitted to stepdown and PCCM was consulted.  Patient is slow to improve, however improving.  Still requiring BiPAP.  Assessment & Plan: Active Problems:   HTN (hypertension)   Multifocal pneumonia   Hypoxia   Sepsis due to pneumococcus (Travis Salinas)   Sepsis in the setting of multifocal pneumonia due to Streptococcus pneumoniae -Blood cultures with gram-positive cocci, Streptococcus pneumoniae 1 out of 2 bottles.  ID panel also reflects positive for Streptococcus pneumoniae, which is less likely to be a contaminant.  Surveillance cultures today.  No routine 2D echo is required for Streptococcus pneumonia bacteremia, discussed with ID on 9/20.  Sensitivities show that is sensitive to Levaquin, will transition to that on discharge, meanwhile keep on ceftriaxone -Respiratory virus panel sent, came back positive with rhinovirus.  Supportive treatment -Patient was febrile on admission, fever has resolved for a few days however starting 9/22 he started spiking again.  He also has increased leukocytosis today. Blood cultures were sent on 9/22 and are still pending.  Continue strep pneumo coverage unless cultures will show different organisms. Send sputum culture as well  COPD exacerbation -In the setting of #1, no further  wheezing, continue duo nebs, change Solu-Medrol to prednisone -Critical care followed patient, signed off on 9/21, re-consulted 9/22 due to worsening respiratory status -Discussed with Dr. Halford Chessman this morning  Acute hypoxic respiratory failure -Due to #1 and #2, on BiPAP, continues to require BiPAP intermittently -Attempt to wean off oxygen failed, started desatting again this morning requiring increase to 100%.  PCCM notified.  Given Lasix due to suspected pulmonary edema, chest x-ray however was essentially unchanged -He remains critical, still requiring BiPAP and is desatting on a nonrebreather  AKI on CKD 2 -Acute kidney injury resolved, creatinine is back to his baseline, 0.88 this morning  HTN -Blood pressure is stable, continue home medications, 143/51 today   Depression/anxiety -Continue home medications  BPH -Continue home medications  Polysubstance abuse -UDS positive for cocaine.   DVT prophylaxis: Lovenox Code Status: Full code Family Communication: Updated wife at bedside Disposition Plan: Remain in stepdown  Consultants:   PCCM  Procedures:   BiPAP  Antimicrobials:  Zosyn 9/18 >> 9/19  Ceftriaxone 9/19 >>  Subjective: -Still working hard to breathe, appears comfortable on the BiPAP  Objective: Vitals:   06/01/17 0815 06/01/17 0904 06/01/17 0905 06/01/17 1000  BP:  108/80 108/80 (!) 143/51  Pulse:    96  Resp:    (!) 21  Temp:      TempSrc:      SpO2: 92%   (!) 88%  Weight:      Height:        Intake/Output Summary (Last 24 hours) at 06/01/17 1104 Last data filed at 06/01/17 1000  Gross per 24 hour  Intake  490 ml  Output             2325 ml  Net            -1835 ml   Filed Weights   05/30/17 0345 05/31/17 0404 06/01/17 0339  Weight: 70.1 kg (154 lb 8.7 oz) 69.3 kg (152 lb 12.5 oz) 63 kg (138 lb 14.2 oz)    Examination:  Vitals:   06/01/17 0815 06/01/17 0904 06/01/17 0905 06/01/17 1000  BP:  108/80 108/80 (!) 143/51    Pulse:    96  Resp:    (!) 21  Temp:      TempSrc:      SpO2: 92%   (!) 88%  Weight:      Height:       Constitutional: anxious, breathing hard Eyes: lids and conjunctivae normal ENMT: Mucous membranes are moist.  Respiratory: coarse breath sounds, no wheezing. Using accessory muscles to breathe  Cardiovascular: Regular rate and rhythm, no murmurs / rubs / gallops. No LE edema. 2+ pedal pulses.  Abdomen: no tenderness. Bowel sounds positive.  Neurologic: non focal   Data Reviewed: I have independently reviewed following labs and imaging studies   CBC:  Recent Labs Lab 05/27/17 0419 05/28/17 0350 05/29/17 0741 05/30/17 0307 05/31/17 0306 06/01/17 0309  WBC 8.0 6.7 11.3* 15.6* 15.3* 18.1*  NEUTROABS 7.4  --   --   --   --   --   HGB 12.5* 10.5* 10.8* 10.1* 10.5* 9.8*  HCT 36.2* 30.8* 32.4* 29.2* 30.9* 29.1*  MCV 81.7 81.9 81.0 80.9 81.1 82.0  PLT 255 216 265 263 291 856   Basic Metabolic Panel:  Recent Labs Lab 05/28/17 0350 05/29/17 0741 05/30/17 0307 05/31/17 0306 06/01/17 0309  NA 137 140 136 139 137  K 3.7 3.8 4.0 3.3* 3.7  CL 106 109 106 106 100*  CO2 22 22 23 24 29   GLUCOSE 140* 125* 137* 119* 92  BUN 25* 31* 31* 25* 25*  CREATININE 1.04 0.93 0.96 0.87 0.88  CALCIUM 8.2* 8.2* 8.2* 8.5* 8.1*  MG 1.9  --   --   --  2.3  PHOS 3.1  --   --   --   --    GFR: Estimated Creatinine Clearance: 74.6 mL/min (by C-G formula based on SCr of 0.88 mg/dL). Liver Function Tests:  Recent Labs Lab 05/27/17 0419 05/28/17 0350 05/31/17 0306  AST 49* 50* 47*  ALT 15* 12* 18  ALKPHOS 49 39 66  BILITOT 0.7 0.9 0.6  PROT 7.3 5.8* 5.6*  ALBUMIN 3.8 2.7* 2.5*   No results for input(s): LIPASE, AMYLASE in the last 168 hours. No results for input(s): AMMONIA in the last 168 hours. Coagulation Profile: No results for input(s): INR, PROTIME in the last 168 hours. Cardiac Enzymes:  Recent Labs Lab 05/27/17 1648 05/27/17 2239 05/28/17 0350  TROPONINI <0.03  <0.03 <0.03   BNP (last 3 results) No results for input(s): PROBNP in the last 8760 hours. HbA1C: No results for input(s): HGBA1C in the last 72 hours. CBG: No results for input(s): GLUCAP in the last 168 hours. Lipid Profile: No results for input(s): CHOL, HDL, LDLCALC, TRIG, CHOLHDL, LDLDIRECT in the last 72 hours. Thyroid Function Tests: No results for input(s): TSH, T4TOTAL, FREET4, T3FREE, THYROIDAB in the last 72 hours. Anemia Panel: No results for input(s): VITAMINB12, FOLATE, FERRITIN, TIBC, IRON, RETICCTPCT in the last 72 hours. Urine analysis:    Component Value Date/Time   COLORURINE YELLOW 05/27/2017  [place] 05/27/2017 0837   LABSPEC 1.014 05/27/2017 [place] 7.0 05/27/2017 0837   GLUCOSEU NEGATIVE 05/27/2017 0837   HGBUR NEGATIVE 05/27/2017 [place] 05/27/2017 [place] 05/27/2017 0837   PROTEINUR 100 (A) 05/27/2017 0837   UROBILINOGEN 1.0 11/14/2013 1424   NITRITE NEGATIVE 05/27/2017 0837   LEUKOCYTESUR NEGATIVE 05/27/2017 0837   Sepsis Labs: Invalid input(s): PROCALCITONIN, LACTICIDVEN  Recent Results (from the past 240 hour(s))  Blood culture (routine x 2)     Status: None (Preliminary result)   Collection Time: 05/27/17  4:30 AM  Result Value Ref Range Status   Specimen Description BLOOD LEFT ARM  Final   Special Requests   Final    BOTTLES DRAWN AEROBIC AND ANAEROBIC Blood Culture adequate volume   Culture   Final    NO GROWTH 4 DAYS Performed at [facility], [address], [place], [place] [address]    Report Status PENDING  Incomplete  Blood culture (routine x 2)     Status: Abnormal   Collection Time: 05/27/17  4:34 AM  Result Value Ref Range Status   Specimen Description BLOOD RIGHT FOREARM  Final   Special Requests   Final    BOTTLES DRAWN AEROBIC AND ANAEROBIC Blood Culture adequate volume   Culture  Setup Time   Final    GRAM POSITIVE COCCI ANAEROBIC BOTTLE ONLY CRITICAL  RESULT CALLED TO, READ BACK BY AND VERIFIED WITH: Damian Leavell [id number] MLM Performed at [facility], [place] [address] [place], [place] [address]    Culture STREPTOCOCCUS PNEUMONIAE (A)  Final   Report Status 05/30/2017 FINAL  Final   Organism ID, Bacteria STREPTOCOCCUS PNEUMONIAE  Final      Susceptibility   Streptococcus pneumoniae - MIC*    ERYTHROMYCIN >=8 RESISTANT Resistant     LEVOFLOXACIN 1 SENSITIVE Sensitive     PENICILLIN (meningitis) 0.12 RESISTANT Resistant     PENICILLIN (non-meningitis) 0.12 SENSITIVE Sensitive     CEFTRIAXONE (non-meningitis) 0.25 SENSITIVE Sensitive     CEFTRIAXONE (meningitis) 0.25 SENSITIVE Sensitive     * STREPTOCOCCUS PNEUMONIAE  Blood Culture ID Panel (Reflexed)     Status: Abnormal   Collection Time: 05/27/17  4:34 AM  Result Value Ref Range Status   Enterococcus species NOT DETECTED NOT DETECTED Final   Vancomycin resistance NOT DETECTED NOT DETECTED Final   Listeria monocytogenes NOT DETECTED NOT DETECTED Final   Staphylococcus species NOT DETECTED NOT DETECTED Final   Staphylococcus aureus NOT DETECTED NOT DETECTED Final   Methicillin resistance NOT DETECTED NOT DETECTED Final   Streptococcus species DETECTED (A) NOT DETECTED Final    Comment: CRITICAL RESULT CALLED TO, READ BACK BY AND VERIFIED WITH: PHARMD J GADHIA [id number] 0815 MLM    Streptococcus agalactiae NOT DETECTED NOT DETECTED Final   Streptococcus pneumoniae DETECTED (A) NOT DETECTED Final    Comment: CRITICAL RESULT CALLED TO, READ BACK BY AND VERIFIED WITH: PHARMD J GADHIA [id number] 0815 MLM    Streptococcus pyogenes NOT DETECTED NOT DETECTED Final   Acinetobacter baumannii NOT DETECTED NOT DETECTED Final   Enterobacteriaceae species NOT DETECTED NOT DETECTED Final   Enterobacter cloacae complex NOT DETECTED NOT DETECTED Final   Escherichia coli NOT DETECTED NOT DETECTED Final   Klebsiella oxytoca NOT DETECTED NOT DETECTED Final   Klebsiella pneumoniae NOT DETECTED  NOT DETECTED Final   Proteus species NOT DETECTED NOT DETECTED Final   Serratia marcescens NOT  DETECTED NOT DETECTED Final   Carbapenem resistance NOT DETECTED NOT DETECTED Final   Haemophilus influenzae NOT DETECTED NOT DETECTED Final   Neisseria meningitidis NOT DETECTED NOT DETECTED Final   Pseudomonas aeruginosa NOT DETECTED NOT DETECTED Final   Candida albicans NOT DETECTED NOT DETECTED Final   Candida glabrata NOT DETECTED NOT DETECTED Final   Candida krusei NOT DETECTED NOT DETECTED Final   Candida parapsilosis NOT DETECTED NOT DETECTED Final   Candida tropicalis NOT DETECTED NOT DETECTED Final    Comment: Performed at [facility], [place] [address] [place], [place] [address]  MRSA PCR Screening     Status: None   Collection Time: 05/27/17  6:17 PM  Result Value Ref Range Status   MRSA by PCR NEGATIVE NEGATIVE Final    Comment:        The GeneXpert MRSA Assay (FDA approved for NASAL specimens only), is one component of a comprehensive MRSA colonization surveillance program. It is not intended to diagnose MRSA infection nor to guide or monitor treatment for MRSA infections.   Respiratory Panel by PCR     Status: Abnormal   Collection Time: 05/27/17 10:55 PM  Result Value Ref Range Status   Adenovirus NOT DETECTED NOT DETECTED Final   Coronavirus 229E NOT DETECTED NOT DETECTED Final   Coronavirus HKU1 NOT DETECTED NOT DETECTED Final   Coronavirus NL63 NOT DETECTED NOT DETECTED Final   Coronavirus OC43 NOT DETECTED NOT DETECTED Final   Metapneumovirus NOT DETECTED NOT DETECTED Final   Rhinovirus / Enterovirus DETECTED (A) NOT DETECTED Final   Influenza A NOT DETECTED NOT DETECTED Final   Influenza A H1 NOT DETECTED NOT DETECTED Final   Influenza A H1 2009 NOT DETECTED NOT DETECTED Final   Influenza A H3 NOT DETECTED NOT DETECTED Final   Influenza B NOT DETECTED NOT DETECTED Final   Parainfluenza Virus 1 NOT DETECTED NOT DETECTED Final   Parainfluenza Virus 2 NOT  DETECTED NOT DETECTED Final   Parainfluenza Virus 3 NOT DETECTED NOT DETECTED Final   Parainfluenza Virus 4 NOT DETECTED NOT DETECTED Final   Respiratory Syncytial Virus NOT DETECTED NOT DETECTED Final   Bordetella pertussis NOT DETECTED NOT DETECTED Final   Chlamydophila pneumoniae NOT DETECTED NOT DETECTED Final   Mycoplasma pneumoniae NOT DETECTED NOT DETECTED Final    Comment: Performed at [facility], [place], [place], [place] [address]      Radiology Studies: Dg Chest Port 1 View  Result Date: 05/31/2017 CLINICAL DATA:  Worsening shortness of breath EXAM: PORTABLE CHEST 1 VIEW COMPARISON:  Chest x-rays dated 05/30/2017 and 05/29/2017. FINDINGS: Heart size and mediastinal contours are within normal limits. Atherosclerotic changes noted at the aortic arch. The diffuse bilateral opacities are not significantly changed. No new lung findings. No pleural effusion or pneumothorax seen. No acute or suspicious osseous finding. IMPRESSION: 1. Stable chest x-ray. Diffuse bilateral lung opacities are unchanged, predominantly interstitial, corresponding to the diffuse ground-glass opacities demonstrated on chest CT of 05/27/2017, presumably multifocal pneumonia. 2. Aortic atherosclerosis. Electronically Signed   By: Franki Cabot M.D.   On: 05/31/2017 08:16     Scheduled Meds: . amLODipine  10 mg Oral q morning - 10a  . ARIPiprazole  20 mg Oral Daily  . aspirin EC  81 mg Oral Daily  . atenolol  25 mg Oral BH-q7a  . clonazePAM  0.5 mg Oral BID  . dextromethorphan-guaiFENesin  1 tablet Oral BID  . enoxaparin (LOVENOX) injection  40 mg  Subcutaneous Q24H  . feeding supplement (ENSURE ENLIVE)  237 mL Oral BID BM  . FLUoxetine  20 mg Oral Daily  . ipratropium-albuterol  3 mL Nebulization Q6H  . lamoTRIgine  200 mg Oral q morning - 10a  . predniSONE  30 mg Oral Q breakfast  . tamsulosin  0.4 mg Oral Daily   Continuous Infusions: . sodium chloride 10 mL/hr at 05/31/17 1400  .  cefTRIAXone (ROCEPHIN)  IV Stopped (06/01/17 0941)    Marzetta Board, MD, PhD [facility] Pager [phone]  If 7PM-7AM, please contact night-coverage [email or web address] Password [email or web address] 06/01/2017, 11:03 AM

## 2017-06-01 NOTE — Progress Notes (Signed)
Name: Travis Salinas MRN: [id number] DOB: 1951-06-08    ADMISSION DATE:  05/27/2017 CONSULTATION DATE:  05/27/2017  REFERRING MD :  Dr. Patrecia Pour   CHIEF COMPLAINT:  Hypoxia   HISTORY OF PRESENT ILLNESS:   66 yo male with dyspnea, fever (101.5), chills, weakness, hypoxia from CAP with AECOPD.  Hx of Hep C, HTN, polysubstance abuse.  SUBJECTIVE:  Spiked fever again.  Still needing increased FiO2.    VITAL SIGNS: BP (!) 143/51   Pulse 96   Temp (!) 101.1 F (38.4 C) (Axillary)   Resp (!) 21   Ht 6' (1.829 m)   Wt 138 lb 14.2 oz (63 kg)   SpO2 (!) 88%   BMI 18.84 kg/m   INTAKE/OUTPUT: I/O last 3 completed shifts: In: 190 [I.V.:190] Out: 5450 [Urine:5450]  PHYSICAL EXAMINATION:  General - alert Eyes - pupils reactive ENT - wearing NRB and [place] Cardiac - regular, no murmur Chest - scattered rhonchi, better air movement Abd - soft, non tender Ext - no edema, decreased muscle bulk Skin - small laceration Lt lower leg healing w/o drainage/redness/tenderness Neuro - normal strength, fidgety   CMP Latest Ref Rng & Units 06/01/2017 05/31/2017 05/30/2017  Glucose 65 - 99 mg/dL 92 119(H) 137(H)  BUN 6 - 20 mg/dL 25(H) 25(H) 31(H)  Creatinine 0.61 - 1.24 mg/dL 0.88 0.87 0.96  Sodium 135 - 145 mmol/L 137 139 136  Potassium 3.5 - 5.1 mmol/L 3.7 3.3(L) 4.0  Chloride 101 - 111 mmol/L 100(L) 106 106  CO2 22 - 32 mmol/L 29 24 23   Calcium 8.9 - 10.3 mg/dL 8.1(L) 8.5(L) 8.2(L)  Total Protein 6.5 - 8.1 g/dL - 5.6(L) -  Total Bilirubin 0.3 - 1.2 mg/dL - 0.6 -  Alkaline Phos 38 - 126 U/L - 66 -  AST 15 - 41 U/L - 47(H) -  ALT 17 - 63 U/L - 18 -    CBC Latest Ref Rng & Units 06/01/2017 05/31/2017 05/30/2017  WBC 4.0 - 10.5 K/uL 18.1(H) 15.3(H) 15.6(H)  Hemoglobin 13.0 - 17.0 g/dL 9.8(L) 10.5(L) 10.1(L)  Hematocrit 39.0 - 52.0 % 29.1(L) 30.9(L) 29.2(L)  Platelets 150 - 400 K/uL 308 291 263    ABG    Component Value Date/Time   PHART 7.467 (H) 05/31/2017 0820   PCO2ART  35.3 05/31/2017 0820   PO2ART 137 (H) 05/31/2017 0820   HCO3 25.1 05/31/2017 0820   TCO2 24 12/07/2015 1403   ACIDBASEDEF 1.3 05/28/2017 0015   O2SAT 98.7 05/31/2017 0820    IMAGING: Dg Chest Port 1 View  Result Date: 05/31/2017 CLINICAL DATA:  Worsening shortness of breath EXAM: PORTABLE CHEST 1 VIEW COMPARISON:  Chest x-rays dated 05/30/2017 and 05/29/2017. FINDINGS: Heart size and mediastinal contours are within normal limits. Atherosclerotic changes noted at the aortic arch. The diffuse bilateral opacities are not significantly changed. No new lung findings. No pleural effusion or pneumothorax seen. No acute or suspicious osseous finding. IMPRESSION: 1. Stable chest x-ray. Diffuse bilateral lung opacities are unchanged, predominantly interstitial, corresponding to the diffuse ground-glass opacities demonstrated on chest CT of 05/27/2017, presumably multifocal pneumonia. 2. Aortic atherosclerosis. Electronically Signed   By: Franki Cabot M.D.   On: 05/31/2017 08:16    Drugs of Abuse     Component Value Date/Time   LABOPIA POSITIVE (A) 05/27/2017 0837   COCAINSCRNUR POSITIVE (A) 05/27/2017 0837   LABBENZ NONE DETECTED 05/27/2017 0837   AMPHETMU NONE DETECTED 05/27/2017 [place] DETECTED 05/27/2017 4656  LABBARB NONE DETECTED 05/27/2017 0837     STUDIES: V/Q 9/18 > Very low probability for pulmonary embolism (0 - 9%). CT chest 9/18 >> moderate emphysema, b/l patchy consolidation, extensive b/l GGO Echo 9/22 >> EF 60 to 65%, grade 1 DD, PAS 32 mmHg  CULTURES: Blood 9/18 >> Pneumococcus RSV 9/18 >> Rhinovirus Influenza PCR 9/18 >> negative Blood 9/22 >>   ANTIBIOTICS: Zosyn 9/18 >> 9/19 Zithromax 9/18 >> 9/19 Rocephin 9/19 >>   SIGNIFICANT EVENTS  9/18 Presents to ED with dyspnea 9/22 Worsening hypoxia >> better after dose of lasix 9/23 Recurrent fever  DISCUSSION: 66 yo male former smoker with acute hypoxic respiratory failure from PNA, cocaine induced  pneumonitis, and COPD exacerbation with emphysema.  PMHx of HTN, Hep C.  He has recurrent fever 9/23, but otherwise the same.  Continue current Abx and f/u cultures.  ASSESSMENT / PLAN:  Acute hypoxic respiratory failure. AECOPD. Cocaine induced pneumonitis. - oxygen to keep SpO2 > 88% - Bipap as needed - f/u CXR - scheduled BDs - continue prednisone 30 mg daily  Acute diastolic CHF 9/41 with acute pulmonary edema. - even to negative fluid balance as able - continue ASA, norvasc, tenormin  Community acquired pneumonia and bacteremia with Pneumococcus, and rhinovirus. Left leg wound. Recurrent fever 9/23 - day 6 of rocephin - monitor fever curve and f/u repeat cultures >> adjust abx if fever persists, his condition gets worse, or new findings in cultures  Lactic acidosis in setting of sepsis. - resolved  Anemia of critical illness. - f/u CBC intermittently  Moderate protein calorie malnutrition. - tolerating diet  Hx of depression. Cocaine abuse. - he has requested that his family not be informed about his illicit drug use - abilify, klonopin, prozac, lamictal  Deconditioning. - mobilize  DVT prophylaxis - Lovenox SUP - not indicated Nutrition - regular diet Goals of care - full code  Updated pt's wife at bedside (works as Therapist, sports at [employer] in recovery unit).  Chesley Mires, MD [facility] Pulmonary/Critical Care 06/01/2017, 10:22 AM Pager:  [phone] After 3pm call: [phone]

## 2017-06-01 NOTE — Consult Note (Signed)
[place] Nurse wound consult note Reason for Consult: Chronic, healing full thickness wound.  Patient has been seen and followed (is followed by) Plastics MD, C. Dillingham and her PA, S. Rayburn since April of this year (5 months). Wife reports that inadvertently, a small scab was removed recently. Wound type: Full thickness Pressure Injury POA: N/A Measurement: 2cm x 0.4cm x 0.2cm Wound EBR:AXEN pink, moist Drainage (amount, consistency, odor) scant serous Periwound:intact with evidence of previous wound healing Dressing procedure/placement/frequency: I will continue the POC in place by Dr. Marla Roe.  Wife has been performing care at home, RN to perform in house. Dressing regimen is cleanse with NS, pat dry.  Cover wound with silver hydrofiber, top with gauze, secure with a few turns of roll gauze (Kerlix) and secure with paper tape, without applying tape to skin. Bedside RN to initiate tonight; dressing changes will be daily. Thank you for the opportunity to meet these nice people and to participate in their POC. [facility] nursing team will not follow, but will remain available to this patient, the nursing and medical teams.  Please re-consult if needed. Thanks, Maudie Flakes, MSN, RN, [place], Arther Abbott  Pager# [phone]

## 2017-06-02 ENCOUNTER — Inpatient Hospital Stay (HOSPITAL_COMMUNITY): Payer: 59

## 2017-06-02 DIAGNOSIS — J9600 Acute respiratory failure, unspecified whether with hypoxia or hypercapnia: Secondary | ICD-10-CM | POA: Diagnosis present

## 2017-06-02 LAB — BASIC METABOLIC PANEL
ANION GAP: 11 (ref 5–15)
BUN: 26 mg/dL — AB (ref 6–20)
CALCIUM: 8.3 mg/dL — AB (ref 8.9–10.3)
CHLORIDE: 100 mmol/L — AB (ref 101–111)
CO2: 23 mmol/L (ref 22–32)
CREATININE: 0.95 mg/dL (ref 0.61–1.24)
GLUCOSE: 127 mg/dL — AB (ref 65–99)
POTASSIUM: 4.3 mmol/L (ref 3.5–5.1)
Sodium: 134 mmol/L — ABNORMAL LOW (ref 135–145)

## 2017-06-02 LAB — CBC
HCT: 33.6 % — ABNORMAL LOW (ref 39.0–52.0)
Hemoglobin: 11.1 g/dL — ABNORMAL LOW (ref 13.0–17.0)
MCH: 27.1 pg (ref 26.0–34.0)
MCHC: 33 g/dL (ref 30.0–36.0)
MCV: 82 fL (ref 78.0–100.0)
PLATELETS: 286 10*3/uL (ref 150–400)
RBC: 4.1 MIL/uL — ABNORMAL LOW (ref 4.22–5.81)
RDW: 14.3 % (ref 11.5–15.5)
WBC: 24.5 10*3/uL — AB (ref 4.0–10.5)

## 2017-06-02 LAB — PROCALCITONIN: Procalcitonin: 0.43 ng/mL

## 2017-06-02 MED ORDER — VANCOMYCIN HCL IN DEXTROSE 750-5 MG/150ML-% IV SOLN
750.0000 mg | Freq: Two times a day (BID) | INTRAVENOUS | Status: DC
  Administered 2017-06-02 – 2017-06-03 (×4): 750 mg via INTRAVENOUS
  Filled 2017-06-02 (×6): qty 150

## 2017-06-02 MED ORDER — DEXTROSE 5 % IV SOLN
1.0000 g | Freq: Three times a day (TID) | INTRAVENOUS | Status: DC
  Administered 2017-06-02: 1 g via INTRAVENOUS
  Filled 2017-06-02: qty 1

## 2017-06-02 MED ORDER — HALOPERIDOL LACTATE 5 MG/ML IJ SOLN
2.0000 mg | Freq: Four times a day (QID) | INTRAMUSCULAR | Status: DC | PRN
  Administered 2017-06-02 – 2017-06-04 (×2): 2 mg via INTRAVENOUS
  Filled 2017-06-02 (×2): qty 1

## 2017-06-02 MED ORDER — FUROSEMIDE 10 MG/ML IJ SOLN
40.0000 mg | Freq: Four times a day (QID) | INTRAMUSCULAR | Status: AC
  Administered 2017-06-02 (×2): 40 mg via INTRAVENOUS
  Filled 2017-06-02 (×2): qty 4

## 2017-06-02 MED ORDER — IBUPROFEN 800 MG PO TABS
800.0000 mg | ORAL_TABLET | Freq: Four times a day (QID) | ORAL | Status: DC
  Administered 2017-06-02 (×4): 800 mg via ORAL
  Filled 2017-06-02 (×4): qty 1

## 2017-06-02 MED ORDER — POTASSIUM CHLORIDE CRYS ER 20 MEQ PO TBCR
40.0000 meq | EXTENDED_RELEASE_TABLET | Freq: Once | ORAL | Status: AC
Start: 2017-06-02 — End: 2017-06-02
  Administered 2017-06-02: 40 meq via ORAL
  Filled 2017-06-02: qty 2

## 2017-06-02 MED ORDER — DEXTROSE 5 % IV SOLN
2.0000 g | Freq: Three times a day (TID) | INTRAVENOUS | Status: DC
  Administered 2017-06-02 – 2017-06-04 (×6): 2 g via INTRAVENOUS
  Filled 2017-06-02 (×7): qty 2

## 2017-06-02 NOTE — Progress Notes (Signed)
BiPAP V-60 pulled from pts. room, remains available if needed.

## 2017-06-02 NOTE — Progress Notes (Addendum)
Pharmacy Antibiotic Note  Travis Salinas Travis Salinas is a 66 y.o. male currently on ceftriaxone day #7 for CAP.  To broaden abx to vancomycin and ceftazidime on 9/24 for HCAP.  - 9/24 CXR: Worsening bilateral airspace opacities concerning for progressive pneumonia - Tmax 101.1, wbc up 24.5, scr trending up 0.95 (crcl~69)   Plan: - cefepime 1gm IV q8h - vancomycin 750 mg IV q12h - monitor renal closely since patient's on scheduled ibuprofen - scr daily  Adden: abx inadvertently entered as cefepime instead of ceftazidime. Informed Dr. Milinda Hirschfeld -- ok with cefepime for first dose.  Will change subsequent doses to ceftazidime 2gm IV q8h _________________________________  Height: 6' (182.9 cm) Weight: 138 lb 14.2 oz (63 kg) IBW/kg (Calculated) : 77.6  Temp (24hrs), Avg:99.1 F (37.3 C), Min:98.2 F (36.8 C), Max:100.6 F (38.1 C)   Recent Labs Lab 05/27/17 0429 05/27/17 0837  05/29/17 0741 05/30/17 0307 05/31/17 0306 06/01/17 0309 06/02/17 0306  WBC  --   --   < > 11.3* 15.6* 15.3* 18.1* 24.5*  CREATININE  --   --   < > 0.93 0.96 0.87 0.88 0.95  LATICACIDVEN 3.60* 2.34*  --   --   --   --   --   --   < > = values in this interval not displayed.  Estimated Creatinine Clearance: 69.1 mL/min (by C-G formula based on SCr of 0.95 mg/dL).    Allergies  Allergen Reactions  . Propoxyphene Nausea And Vomiting  . Amoxicillin Nausea And Vomiting  . Ciprofloxacin Nausea Only  . Depakote [Divalproex Sodium] Other (See Comments)    hallucinations    Antimicrobials this admission:  PTA doxy 9/18 Rocephin x 1 9/18 Azithro >> 9/19 9/18 Zosyn >> 9/19 9/19 Rocephin 2g >>9/24 9/24 vanc>> 9/24 cefepime>>  Dose adjustments this admission:  --  Microbiology results:  9/18 BCx: 1/4 bottles GPC (BCID = Strep pneumoniae), strep penu (R =erytho, PCN) FINAL 9/18 Influenza panel: neg 9/18 Respiratory panel:+rhinovirus 9/18 MRSA PCR: neg 9/23 sputum: few GPC in clusters 9/22 bcx x2:   Thank  you for allowing pharmacy to be a part of this patient's care.  Lynelle Doctor 06/02/2017 9:50 AM

## 2017-06-02 NOTE — Progress Notes (Signed)
Nutrition Follow-up  DOCUMENTATION CODES:   Non-severe (moderate) malnutrition in context of social or environmental circumstances  INTERVENTION:  - Continue Ensure Enlive BID. - Continue to encourage PO intakes of meals and supplements.  - RD will continue to monitor for additional needs.  NUTRITION DIAGNOSIS:   Malnutrition (Moderate) related to social / environmental circumstances (polysubstance abuse) as evidenced by energy intake < or equal to 50% for > or equal to 5 days. -ongoing  GOAL:   Patient will meet greater than or equal to 90% of their needs -unmet at this time time, improving.   MONITOR:   PO intake, Supplement acceptance, Weight trends, Labs, Skin  ASSESSMENT:   Pt with PMH of HTN, COPD, polysubstance abuse, depression, BPH. Presents this admission with severe sepsis secondary to acute respiratory failure due to HCAP.   9/24 Diet was advanced from NPO to [place] on 9/20 at 8:20 AM and then to Regular at 8:47 AM. No intakes documented since diet advancements that day. Wife at bedside and was setting pt up for breakfast (bacon, scrambled eggs, and grits). Pt fairly drowsy and nodding off during RD's brief visit. Wife reports that intakes have improved since pt has transitioned from NRB mask to HFNC. When he had NRB he would be able to remove mask for one bite and then have to quickly replace it between bites. She is hopeful that he will eat ok for breakfast this AM. He has been drinking Ensure Enlive ~50% of the time that he is provided with it. Per chart review, pt has lost 11 lbs/5 kg since admission. Will monitor weight trends closely.   Medications reviewed; 40 mg IV Lasix QID, 30 mg Deltasone/day.  Labs reviewed; Na: 134 mmol/L, Cl: 100 mmol/L, BUN: 26 mg/dL, Ca: 8.3 mg/dL.    9/19 - Spoke with pt at bedside who is currently on CPAP.  - Will try to obtain more information once CPAP d/c.   - Reports having decrease in appetite for two weeks related to unknown  etiology.  - Pt typically consumes 1-2 meals per day, but could not elaborate.  - Pt consuming 3 Ensures daily at home.  - Records show pt has maintained a stable wt of 145-150 lb for over a year. - Reports a UBW of 140-142 lb during his life span.  - Nutrition-Focused physical exam completed.  - Findings are no fat depletion, moderate muscle depletion, and no edema.  - Suspect pt has some form of malnutrition given his intake recall and muscle depletions. - RD to monitor for diet advancement and provide supplementation this hospital stay.    Diet Order:  Diet regular Room service appropriate? Yes; Fluid consistency: Thin  Skin:  Wound (see comment) (L leg full thickness wound)  Last BM:  9/18  Height:   Ht Readings from Last 1 Encounters:  05/28/17 6' (1.829 m)    Weight:   Wt Readings from Last 1 Encounters:  06/01/17 138 lb 14.2 oz (63 kg)    Ideal Body Weight:  80.9 kg  BMI:  Body mass index is 18.84 kg/m.  Estimated Nutritional Needs:   Kcal:  1900-2100 (28-31 kcal/kg)  Protein:  105-115 grams (1.5-1.7 g/kg)  Fluid:  >1.9 L/day  EDUCATION NEEDS:   No education needs identified at this time    Jarome Matin, MS, RD, LDN, CNSC Inpatient Clinical Dietitian Pager # [phone] After hours/weekend pager # [phone]

## 2017-06-02 NOTE — Progress Notes (Signed)
Name: Travis Salinas MRN: [id number] DOB: 1951/03/19    ADMISSION DATE:  05/27/2017 CONSULTATION DATE:  05/27/2017  REFERRING MD :  Dr. Patrecia Pour   CHIEF COMPLAINT:  Hypoxia   HISTORY OF PRESENT ILLNESS:   66 yo male with dyspnea, fever (101.5), chills, weakness, hypoxia from CAP with AECOPD.  Hx of Hep C, HTN, polysubstance abuse.  SUBJECTIVE:  C/o chest pain w/ breathing   VITAL SIGNS: BP (!) 165/69 (BP Location: Right Arm)   Pulse (!) 108   Temp 99.2 F (37.3 C) (Axillary)   Resp 19   Ht 6' (1.829 m)   Wt 138 lb 14.2 oz (63 kg)   SpO2 90%   BMI 18.84 kg/m   INTAKE/OUTPUT:  Intake/Output Summary (Last 24 hours) at 06/02/17 4098 Last data filed at 06/02/17 0843  Gross per 24 hour  Intake              600 ml  Output              700 ml  Net             -100 ml    PHYSICAL EXAMINATION: General appearance:  66 Year old  Male well nourished NAD,   conversant  Eyes: anicteric sclerae, moist conjunctivae; PERRL, EOMI bilaterally. Mouth: moist membranes and no mucosal ulcerations;  Neck: Trachea midline; neck supple, no JVD Lungs/chest: basilar rales, with normal respiratory effort and no intercostal retractions, no wheezing  CV: RRR, no MRGs  Abdomen: Soft, non-tender; no masses  Extremities: No peripheral edema Skin: Normal temperature, turgor and texture; no rash, ulcers Psych: Appropriate affect, alert and oriented to person, place and time   CMP Latest Ref Rng & Units 06/02/2017 06/01/2017 05/31/2017  Glucose 65 - 99 mg/dL 127(H) 92 119(H)  BUN 6 - 20 mg/dL 26(H) 25(H) 25(H)  Creatinine 0.61 - 1.24 mg/dL 0.95 0.88 0.87  Sodium 135 - 145 mmol/L 134(L) 137 139  Potassium 3.5 - 5.1 mmol/L 4.3 3.7 3.3(L)  Chloride 101 - 111 mmol/L 100(L) 100(L) 106  CO2 22 - 32 mmol/L 23 29 24   Calcium 8.9 - 10.3 mg/dL 8.3(L) 8.1(L) 8.5(L)  Total Protein 6.5 - 8.1 g/dL - - 5.6(L)  Total Bilirubin 0.3 - 1.2 mg/dL - - 0.6  Alkaline Phos 38 - 126 U/L - - 66  AST 15 - 41 U/L  - - 47(H)  ALT 17 - 63 U/L - - 18    CBC Latest Ref Rng & Units 06/02/2017 06/01/2017 05/31/2017  WBC 4.0 - 10.5 K/uL 24.5(H) 18.1(H) 15.3(H)  Hemoglobin 13.0 - 17.0 g/dL 11.1(L) 9.8(L) 10.5(L)  Hematocrit 39.0 - 52.0 % 33.6(L) 29.1(L) 30.9(L)  Platelets 150 - 400 K/uL 286 308 291    ABG    Component Value Date/Time   PHART 7.467 (H) 05/31/2017 0820   PCO2ART 35.3 05/31/2017 0820   PO2ART 137 (H) 05/31/2017 0820   HCO3 25.1 05/31/2017 0820   TCO2 24 12/07/2015 1403   ACIDBASEDEF 1.3 05/28/2017 0015   O2SAT 98.7 05/31/2017 0820    IMAGING: Dg Chest Port 1 View  Result Date: 06/02/2017 CLINICAL DATA:  Community-acquired pneumonia. EXAM: PORTABLE CHEST 1 VIEW COMPARISON:  Radiograph 05/31/2017.  CT 01/24/2017 FINDINGS: Worsening diffuse bilateral airspace disease with increasing opacities in the upper lung zones. Unchanged heart size and mediastinal contour. No pleural fluid. No pneumothorax. No acute osseous abnormalities are seen. IMPRESSION: Worsening bilateral airspace opacities concerning for progressive pneumonia. ARDS or less likely pulmonary edema  could have a similar appearance. Electronically Signed   By: Jeb Levering M.D.   On: 06/02/2017 06:00    Drugs of Abuse     Component Value Date/Time   LABOPIA POSITIVE (A) 05/27/2017 0837   COCAINSCRNUR POSITIVE (A) 05/27/2017 0837   LABBENZ NONE DETECTED 05/27/2017 0837   AMPHETMU NONE DETECTED 05/27/2017 0837   THCU NONE DETECTED 05/27/2017 0837   LABBARB NONE DETECTED 05/27/2017 0837     STUDIES: V/Q 9/18 > Very low probability for pulmonary embolism (0 - 9%). CT chest 9/18 >> moderate emphysema, b/l patchy consolidation, extensive b/l GGO Echo 9/22 >> EF 60 to 65%, grade 1 DD, PAS 32 mmHg  CULTURES: Blood 9/18 >> Pneumococcus RSV 9/18 >> Rhinovirus Influenza PCR 9/18 >> negative Blood 9/22 >>   ANTIBIOTICS: Zosyn 9/18 >> 9/19 Zithromax 9/18 >> 9/19 Rocephin 9/19 >> 9/24  SIGNIFICANT EVENTS  9/18 Presents  to ED with dyspnea 9/22 Worsening hypoxia >> better after dose of lasix 9/23 Recurrent fever  DISCUSSION: 66 yo male former smoker with acute hypoxic respiratory failure from PNA, cocaine induced pneumonitis, and COPD exacerbation with emphysema.  PMHx of HTN, Hep C.   ASSESSMENT / PLAN:  Acute hypoxic respiratory failure. CAP (pneumococal)  AECOPD. Pleuritic CP Possible Cocaine induced pneumonitis. PCXR personally reviewed w/ diffuse worsening of bilateral airspace disease c/w prior film.  Plan change to high flow oxygen Scheduled BDs Cont pred at 30mg /d Repeat lasix Add scheduled ibuprofen for pleuritic discomfort  May need to consider repeat CT chest if CXR not improving   Community acquired pneumonia and bacteremia with Pneumococcus, and rhinovirus. Left leg wound. Recurrent fever 9/23; WBC trending UP; f/u sputum form 9/23 w/ GPC in clusters Plan Day #7 abx Change to HCAP coverage (vanc and fortaz) Trend PCT algo  Acute diastolic CHF 8/75 with acute pulmonary edema. Plan Cont lasix for negative volume status goal Cont asa, norvasc and tenormin   Anemia of critical illness. Plan Serial cbc Transfuse per protocol  Moderate protein calorie malnutrition. Plan Cont DAT   Hx of depression. Cocaine abuse. - he has requested that his family not be informed about his illicit drug use Plan Cont abilify, klonopin,prozac and lamictal   Deconditioning. Plan Mobilize   DVT prophylaxis - Lovenox SUP - not indicated Nutrition - regular diet Goals of care - full code  my cct 45 min   Erick Colace ACNP-BC [place] Pager # [phone] OR # [phone] if no answer

## 2017-06-02 NOTE — Progress Notes (Signed)
PROGRESS NOTE  Travis Salinas FAO:[id number] DOB: 1951-07-22 DOA: 05/27/2017 PCP: Kathyrn Lass, MD   LOS: 6 days   Brief Narrative / Interim history: Travis Salinas is a 66 year old male with a medical history of hypertension, substance abuse, COPD, depression/anxiety, and hepatitis C who presented to the ED on 9/18 for fever and shortness of breath. He was seen at his PCP office 4 days prior and was diagnosed with a COPD flare and treated with doxycyline, prednisone, and a cough syrup. CT scan revealed multifocal pneumonia. Cultures revealed strepococcus pneumoniae and he was started on ceftriaxone. He worsened on 9/22 with an elevation in WBC and was febrile on 9/23. Repeat cultures drawn. Continuing ceftriaxone and BiPAP.   Assessment & Plan: Active Problems:   HTN (hypertension)   Multifocal pneumonia   Hypoxia   Sepsis due to pneumococcus ([place])  Sepsis from  multifocal pneumonia due to Streptococcus pneumoniae -Worsening WBC count since 9/22, today 24.5. Spiked fever of up to 101.1 yesterday. No fevers today. -Original cultures on 9/18 revealed streptococcus pneumoniae. Given increase in WBC and fevers, repeat blood cultures on 9/22 revealed no growth at 24 hours- final pending. Respiratory cultures obtained 9/23 revealed no WBC, rare squamous epithelial cells, and few gram positive cocci in clusters- final pending.  -Continue Ceftriaxone until final culture results come back.  -Respiratory panel positive for Rhinovirus. Continue supportive treatment. -CXR done this morning concerning for worsening pneumonia vs ARDS -PCCM notified  COPD exacerbation -Due to #1 -Continue duo-neb and predisone  Acute hypoxic respiratory failure -Due to #1 and #2, requiring BiPAP -CXR as above  Acute Kidney Injury -Resolved, Cr 0.95 this morning  Hypertension -Elevated likely from pain and infection -Continue current medications  Substance Abuse -Urine drug screen positive for  Cocaine  Anxiety/Depression -Nurses report patient was very agitated yesterday and was trying to pull off his oxygen mask. Gave dose of haldol. Patient states he was just trying to reposition the mask. Seems less agitated today.    DVT prophylaxis: lovenox Code Status: Full Family Communication: None at bedside Disposition Plan: Once medically stable  Consultants:   PCCM  Procedures:   BiPAP  Antimicrobials:  Zosyn 9/18 - 9/19  Ceftriaxone 9/19   Subjective: Patient still short of breath and having difficulty breathing. Reports left sided chest pain since admission, unchanged. No other complaints.  Objective: Vitals:   06/02/17 0600 06/02/17 0700 06/02/17 0800 06/02/17 0931  BP: (!) 164/53 (!) 186/79 (!) 165/69 (!) 161/64  Pulse: (!) 125 (!) 124 (!) 108   Resp: (!) 21 (!) 23 19   Temp:   99.2 F (37.3 C)   TempSrc:   Axillary   SpO2:  (!) 79% 90%   Weight:      Height:        Intake/Output Summary (Last 24 hours) at 06/02/17 0950 Last data filed at 06/02/17 0843  Gross per 24 hour  Intake              600 ml  Output              700 ml  Net             -100 ml   Filed Weights   05/30/17 0345 05/31/17 0404 06/01/17 0339  Weight: 70.1 kg (154 lb 8.7 oz) 69.3 kg (152 lb 12.5 oz) 63 kg (138 lb 14.2 oz)    Examination: Constitutional: Appears uncomfortable, wearing BiPAP Eyes: lids and conjunctivae normal Respiratory: Diffuse  coarse breath sounds. No wheezing. Using accessory muscles.  Cardiovascular: Regular rate and rhythm, no murmurs / rubs / gallops. No LE edema. 2+ pedal pulses.   Abdomen: no tenderness. Bowel sounds positive.  Psychiatric: Alert and oriented x 3.  Data Reviewed: I have independently reviewed following labs and imaging studies.  CBC:  Recent Labs Lab 05/27/17 0419  05/29/17 0741 05/30/17 0307 05/31/17 0306 06/01/17 0309 06/02/17 0306  WBC 8.0  < > 11.3* 15.6* 15.3* 18.1* 24.5*  NEUTROABS 7.4  --   --   --   --   --   --   HGB  12.5*  < > 10.8* 10.1* 10.5* 9.8* 11.1*  HCT 36.2*  < > 32.4* 29.2* 30.9* 29.1* 33.6*  MCV 81.7  < > 81.0 80.9 81.1 82.0 82.0  PLT 255  < > 265 263 291 308 286  < > = values in this interval not displayed. Basic Metabolic Panel:  Recent Labs Lab 05/28/17 0350 05/29/17 0741 05/30/17 0307 05/31/17 0306 06/01/17 0309 06/02/17 0306  NA 137 140 136 139 137 134*  K 3.7 3.8 4.0 3.3* 3.7 4.3  CL 106 109 106 106 100* 100*  CO2 22 22 23 24 29 23   GLUCOSE 140* 125* 137* 119* 92 127*  BUN 25* 31* 31* 25* 25* 26*  CREATININE 1.04 0.93 0.96 0.87 0.88 0.95  CALCIUM 8.2* 8.2* 8.2* 8.5* 8.1* 8.3*  MG 1.9  --   --   --  2.3  --   PHOS 3.1  --   --   --   --   --    GFR: Estimated Creatinine Clearance: 69.1 mL/min (by C-G formula based on SCr of 0.95 mg/dL). Liver Function Tests:  Recent Labs Lab 05/27/17 0419 05/28/17 0350 05/31/17 0306  AST 49* 50* 47*  ALT 15* 12* 18  ALKPHOS 49 39 66  BILITOT 0.7 0.9 0.6  PROT 7.3 5.8* 5.6*  ALBUMIN 3.8 2.7* 2.5*  Cardiac Enzymes:  Recent Labs Lab 05/27/17 1648 05/27/17 2239 05/28/17 0350  TROPONINI <0.03 <0.03 <0.03   Urine analysis:    Component Value Date/Time   COLORURINE YELLOW 05/27/2017 [place] 05/27/2017 0837   LABSPEC 1.014 05/27/2017 [place] 7.0 05/27/2017 0837   GLUCOSEU NEGATIVE 05/27/2017 0837   HGBUR NEGATIVE 05/27/2017 0837   BILIRUBINUR NEGATIVE 05/27/2017 0837   KETONESUR NEGATIVE 05/27/2017 0837   PROTEINUR 100 (A) 05/27/2017 0837   UROBILINOGEN 1.0 11/14/2013 1424   NITRITE NEGATIVE 05/27/2017 0837   LEUKOCYTESUR NEGATIVE 05/27/2017 0837   Sepsis Labs: Invalid input(s): PROCALCITONIN, LACTICIDVEN  Recent Results (from the past 240 hour(s))  Blood culture (routine x 2)     Status: None   Collection Time: 05/27/17  4:30 AM  Result Value Ref Range Status   Specimen Description BLOOD LEFT ARM  Final   Special Requests   Final    BOTTLES DRAWN AEROBIC AND ANAEROBIC Blood Culture  adequate volume   Culture   Final    NO GROWTH 5 DAYS Performed at [facility], [address], [place],  [address]    Report Status 06/01/2017 FINAL  Final  Blood culture (routine x 2)     Status: Abnormal   Collection Time: 05/27/17  4:34 AM  Result Value Ref Range Status   Specimen Description BLOOD RIGHT FOREARM  Final   Special Requests   Final    BOTTLES DRAWN AEROBIC AND ANAEROBIC Blood Culture adequate volume   Culture  Setup Time   Final    GRAM POSITIVE COCCI ANAEROBIC BOTTLE ONLY CRITICAL RESULT CALLED TO, READ BACK BY AND VERIFIED WITH: Damian Leavell [id number] MLM Performed at [facility], [place] [address], [place], [place] [address]    Culture STREPTOCOCCUS PNEUMONIAE (A)  Final   Report Status 05/30/2017 FINAL  Final   Organism ID, Bacteria STREPTOCOCCUS PNEUMONIAE  Final      Susceptibility   Streptococcus pneumoniae - MIC*    ERYTHROMYCIN >=8 RESISTANT Resistant     LEVOFLOXACIN 1 SENSITIVE Sensitive     PENICILLIN (meningitis) 0.12 RESISTANT Resistant     PENICILLIN (non-meningitis) 0.12 SENSITIVE Sensitive     CEFTRIAXONE (non-meningitis) 0.25 SENSITIVE Sensitive     CEFTRIAXONE (meningitis) 0.25 SENSITIVE Sensitive     * STREPTOCOCCUS PNEUMONIAE  Blood Culture ID Panel (Reflexed)     Status: Abnormal   Collection Time: 05/27/17  4:34 AM  Result Value Ref Range Status   Enterococcus species NOT DETECTED NOT DETECTED Final   Vancomycin resistance NOT DETECTED NOT DETECTED Final   Listeria monocytogenes NOT DETECTED NOT DETECTED Final   Staphylococcus species NOT DETECTED NOT DETECTED Final   Staphylococcus aureus NOT DETECTED NOT DETECTED Final   Methicillin resistance NOT DETECTED NOT DETECTED Final   Streptococcus species DETECTED (A) NOT DETECTED Final    Comment: CRITICAL RESULT CALLED TO, READ BACK BY AND VERIFIED WITH: PHARMD J GADHIA [id number] 0815 MLM    Streptococcus agalactiae NOT DETECTED NOT DETECTED Final   Streptococcus  pneumoniae DETECTED (A) NOT DETECTED Final    Comment: CRITICAL RESULT CALLED TO, READ BACK BY AND VERIFIED WITH: PHARMD J GADHIA [id number] 0815 MLM    Streptococcus pyogenes NOT DETECTED NOT DETECTED Final   Acinetobacter baumannii NOT DETECTED NOT DETECTED Final   Enterobacteriaceae species NOT DETECTED NOT DETECTED Final   Enterobacter cloacae complex NOT DETECTED NOT DETECTED Final   Escherichia coli NOT DETECTED NOT DETECTED Final   Klebsiella oxytoca NOT DETECTED NOT DETECTED Final   Klebsiella pneumoniae NOT DETECTED NOT DETECTED Final   Proteus species NOT DETECTED NOT DETECTED Final   Serratia marcescens NOT DETECTED NOT DETECTED Final   Carbapenem resistance NOT DETECTED NOT DETECTED Final   Haemophilus influenzae NOT DETECTED NOT DETECTED Final   Neisseria meningitidis NOT DETECTED NOT DETECTED Final   Pseudomonas aeruginosa NOT DETECTED NOT DETECTED Final   Candida albicans NOT DETECTED NOT DETECTED Final   Candida glabrata NOT DETECTED NOT DETECTED Final   Candida krusei NOT DETECTED NOT DETECTED Final   Candida parapsilosis NOT DETECTED NOT DETECTED Final   Candida tropicalis NOT DETECTED NOT DETECTED Final    Comment: Performed at [facility], [place]. [address], [place],  [address]  MRSA PCR Screening     Status: None   Collection Time: 05/27/17  6:17 PM  Result Value Ref Range Status   MRSA by PCR NEGATIVE NEGATIVE Final    Comment:        The GeneXpert MRSA Assay (FDA approved for NASAL specimens only), is one component of a comprehensive MRSA colonization surveillance program. It is not intended to diagnose MRSA infection nor to guide or monitor treatment for MRSA infections.   Respiratory Panel by PCR     Status: Abnormal   Collection Time: 05/27/17 10:55 PM  Result Value Ref Range Status   Adenovirus NOT DETECTED NOT DETECTED Final   Coronavirus 229E NOT DETECTED NOT DETECTED Final   Coronavirus HKU1 NOT DETECTED NOT DETECTED Final  Coronavirus  NL63 NOT DETECTED NOT DETECTED Final   Coronavirus OC43 NOT DETECTED NOT DETECTED Final   Metapneumovirus NOT DETECTED NOT DETECTED Final   Rhinovirus / Enterovirus DETECTED (A) NOT DETECTED Final   Influenza A NOT DETECTED NOT DETECTED Final   Influenza A H1 NOT DETECTED NOT DETECTED Final   Influenza A H1 2009 NOT DETECTED NOT DETECTED Final   Influenza A H3 NOT DETECTED NOT DETECTED Final   Influenza B NOT DETECTED NOT DETECTED Final   Parainfluenza Virus 1 NOT DETECTED NOT DETECTED Final   Parainfluenza Virus 2 NOT DETECTED NOT DETECTED Final   Parainfluenza Virus 3 NOT DETECTED NOT DETECTED Final   Parainfluenza Virus 4 NOT DETECTED NOT DETECTED Final   Respiratory Syncytial Virus NOT DETECTED NOT DETECTED Final   Bordetella pertussis NOT DETECTED NOT DETECTED Final   Chlamydophila pneumoniae NOT DETECTED NOT DETECTED Final   Mycoplasma pneumoniae NOT DETECTED NOT DETECTED Final    Comment: Performed at [facility], [place] [address] [place], [place] [address]  Culture, blood (Routine X 2) w Reflex to ID Panel     Status: None (Preliminary result)   Collection Time: 05/31/17  1:20 PM  Result Value Ref Range Status   Specimen Description BLOOD BLOOD RIGHT HAND  Final   Special Requests   Final    BOTTLES DRAWN AEROBIC ONLY Blood Culture adequate volume   Culture   Final    NO GROWTH < 24 HOURS Performed at [facility], [place] [address], [place], [place] [address]    Report Status PENDING  Incomplete  Culture, blood (Routine X 2) w Reflex to ID Panel     Status: None (Preliminary result)   Collection Time: 05/31/17  1:26 PM  Result Value Ref Range Status   Specimen Description BLOOD BLOOD LEFT HAND  Final   Special Requests IN PEDIATRIC BOTTLE Blood Culture adequate volume  Final   Culture   Final    NO GROWTH < 24 HOURS Performed at [facility], [place] [address] [place], [place] [address]    Report Status PENDING  Incomplete  Culture, expectorated  sputum-assessment     Status: None   Collection Time: 06/01/17 11:12 AM  Result Value Ref Range Status   Specimen Description SPUTUM  Final   Special Requests NONE  Final   Sputum evaluation THIS SPECIMEN IS ACCEPTABLE FOR SPUTUM CULTURE  Final   Report Status 06/01/2017 FINAL  Final  Culture, respiratory (NON-Expectorated)     Status: None (Preliminary result)   Collection Time: 06/01/17 11:12 AM  Result Value Ref Range Status   Specimen Description SPUTUM  Final   Special Requests NONE Reflexed from [id number]  Final   Gram Stain   Final    NO WBC SEEN RARE SQUAMOUS EPITHELIAL CELLS PRESENT FEW GRAM POSITIVE COCCI IN CLUSTERS Performed at [facility], [place] [address], [place], [place] [address]    Culture PENDING  Incomplete   Report Status PENDING  Incomplete    Radiology Studies: Dg Chest Port 1 View  Result Date: 06/02/2017 CLINICAL DATA:  Community-acquired pneumonia. EXAM: PORTABLE CHEST 1 VIEW COMPARISON:  Radiograph 05/31/2017.  CT 01/24/2017 FINDINGS: Worsening diffuse bilateral airspace disease with increasing opacities in the upper lung zones. Unchanged heart size and mediastinal contour. No pleural fluid. No pneumothorax. No acute osseous abnormalities are seen. IMPRESSION: Worsening bilateral airspace opacities concerning for progressive pneumonia. ARDS or less likely pulmonary edema could have a similar appearance. Electronically  Signed   By: Jeb Levering M.D.   On: 06/02/2017 06:00   Scheduled Meds: . amLODipine  10 mg Oral q morning - 10a  . ARIPiprazole  20 mg Oral Daily  . aspirin EC  81 mg Oral Daily  . atenolol  25 mg Oral BH-q7a  . clonazePAM  0.5 mg Oral BID  . dextromethorphan-guaiFENesin  1 tablet Oral BID  . enoxaparin (LOVENOX) injection  40 mg Subcutaneous Q24H  . feeding supplement (ENSURE ENLIVE)  237 mL Oral BID BM  . FLUoxetine  20 mg Oral Daily  . furosemide  40 mg Intravenous Q6H  . ibuprofen  800 mg Oral QID  . ipratropium-albuterol  3 mL  Nebulization Q6H  . lamoTRIgine  200 mg Oral q morning - 10a  . potassium chloride  40 mEq Oral Once  . predniSONE  30 mg Oral Q breakfast  . tamsulosin  0.4 mg Oral Daily   Continuous Infusions: . sodium chloride 10 mL/hr at 06/02/17 0100   Verlin Dike, PA-S  Marzetta Board, MD, PhD [facility] Pager [phone]  If 7PM-7AM, please contact night-coverage [email or web address] Password [email or web address] 06/02/2017, 9:50 AM

## 2017-06-02 NOTE — Care Management Note (Signed)
Case Management Note  Patient Details  Name: Della Homan MRN: [id number] Date of Birth: Jul 02, 1951  Subjective/Objective:                  Wound care and dsg changes/bipap removed and nonrebreather applied.  Action/Plan: Date:  June 02, 2017 Chart reviewed for concurrent status and case management needs.  Will continue to follow patient progress.  Discharge Planning: following for needs  Expected discharge date: [id number]  Velva Harman, BSN, [place], [place]   Expected Discharge Date:   (unknown)               Expected Discharge Plan:  Home/Self Care  In-House Referral:     Discharge planning Services  CM Consult  Post Acute Care Choice:    Choice offered to:     DME Arranged:    DME Agency:     HH Arranged:    [place] Agency:     Status of Service:  In process, will continue to follow  If discussed at Long Length of Stay Meetings, dates discussed:    Additional Comments:  Leeroy Cha, RN 06/02/2017, 9:21 AM

## 2017-06-03 ENCOUNTER — Inpatient Hospital Stay (HOSPITAL_COMMUNITY): Payer: 59

## 2017-06-03 LAB — CBC
HCT: 34.4 % — ABNORMAL LOW (ref 39.0–52.0)
HEMOGLOBIN: 11.3 g/dL — AB (ref 13.0–17.0)
MCH: 27.2 pg (ref 26.0–34.0)
MCHC: 32.8 g/dL (ref 30.0–36.0)
MCV: 82.7 fL (ref 78.0–100.0)
Platelets: 240 10*3/uL (ref 150–400)
RBC: 4.16 MIL/uL — ABNORMAL LOW (ref 4.22–5.81)
RDW: 14.2 % (ref 11.5–15.5)
WBC: 30.3 10*3/uL — ABNORMAL HIGH (ref 4.0–10.5)

## 2017-06-03 LAB — CULTURE, RESPIRATORY W GRAM STAIN
Culture: NORMAL
Gram Stain: NONE SEEN

## 2017-06-03 LAB — COMPREHENSIVE METABOLIC PANEL
ALT: 20 U/L (ref 17–63)
ANION GAP: 12 (ref 5–15)
AST: 53 U/L — ABNORMAL HIGH (ref 15–41)
Albumin: 2.4 g/dL — ABNORMAL LOW (ref 3.5–5.0)
Alkaline Phosphatase: 103 U/L (ref 38–126)
BUN: 25 mg/dL — ABNORMAL HIGH (ref 6–20)
CALCIUM: 7.9 mg/dL — AB (ref 8.9–10.3)
CHLORIDE: 98 mmol/L — AB (ref 101–111)
CO2: 26 mmol/L (ref 22–32)
CREATININE: 1.07 mg/dL (ref 0.61–1.24)
Glucose, Bld: 124 mg/dL — ABNORMAL HIGH (ref 65–99)
Potassium: 3.3 mmol/L — ABNORMAL LOW (ref 3.5–5.1)
Sodium: 136 mmol/L (ref 135–145)
Total Bilirubin: 0.7 mg/dL (ref 0.3–1.2)
Total Protein: 6.4 g/dL — ABNORMAL LOW (ref 6.5–8.1)

## 2017-06-03 LAB — PROCALCITONIN: PROCALCITONIN: 0.48 ng/mL

## 2017-06-03 LAB — CULTURE, RESPIRATORY

## 2017-06-03 LAB — MAGNESIUM: MAGNESIUM: 1.9 mg/dL (ref 1.7–2.4)

## 2017-06-03 MED ORDER — PANTOPRAZOLE SODIUM 40 MG PO TBEC
40.0000 mg | DELAYED_RELEASE_TABLET | Freq: Every day | ORAL | Status: DC
  Administered 2017-06-05 – 2017-06-13 (×9): 40 mg via ORAL
  Filled 2017-06-03 (×9): qty 1

## 2017-06-03 MED ORDER — IBUPROFEN 200 MG PO TABS
600.0000 mg | ORAL_TABLET | Freq: Four times a day (QID) | ORAL | Status: DC
  Administered 2017-06-03 – 2017-06-13 (×40): 600 mg via ORAL
  Filled 2017-06-03 (×40): qty 3

## 2017-06-03 NOTE — Progress Notes (Signed)
[facility] Attending Note  ADMISSION DATE:05/27/2017  CONSULTATION DATE:05/27/2017  REFERRING MD:Dr. Patrecia Pour   CHIEF COMPLAINT:Acute Hypoxic Respiratory Failure  Presenting HPI:  66 y.o. male presenting with dyspnea, fever to 101.49F, chills, weakness, and acute hypoxic respiratory failure. Presumed to be secondary to community-acquired pneumonia versus acute exacerbation of underlying COPD. Patient has a history of essential hypertension and hepatitis C viral infection. Patient also has polysubstance abuse including cocaine.  Subjective:  Patient continuing to endorse intermittent pleuritic chest pain. Continued dyspnea relatively unchanged. Intermittent coughing. No new chest pain or pressure.  Review of Systems:  Denies any fever or chills. No abdominal pain or nausea.  Temp:  [97.4 F (36.3 C)-98.2 F (36.8 C)] 97.9 F (36.6 C) (09/25 0800) Pulse Rate:  [71-92] 78 (09/25 1000) Resp:  [12-22] 18 (09/25 1000) BP: (106-145)/(23-80) 106/23 (09/25 1000) SpO2:  [90 %-100 %] 95 % (09/25 1000) FiO2 (%):  [90 %-100 %] 90 % (09/25 0800)  General:  Awake. No distress. Alert. Watching TV. Integument:  Warm & dry. No rash or bruising on exposed skin. HEENT:  No scleral icterus or injection. Pupils symmetric.  Pulmonary: Slightly diminished breath sounds bilateral lung bases. Normal work of breathing on high flow nasal cannula oxygen. Cardiovascular:  Regular rate & rhythm. No JVD apprecaited. No edema. Abdomen:  Soft. Nontender. Normal bowel sounds. Neurological:  Cranial nerves grossly in tact. No meningismus. Moving all 4 extremities equally. Oriented x4.   CBC Latest Ref Rng & Units 06/03/2017 06/02/2017 06/01/2017  WBC 4.0 - 10.5 K/uL 30.3(H) 24.5(H) 18.1(H)  Hemoglobin 13.0 - 17.0 g/dL 11.3(L) 11.1(L) 9.8(L)  Hematocrit 39.0 - 52.0 % 34.4(L) 33.6(L) 29.1(L)  Platelets 150 - 400 K/uL 240 286 308   BMP Latest Ref Rng & Units 06/03/2017 06/02/2017  06/01/2017  Glucose 65 - 99 mg/dL 124(H) 127(H) 92  BUN 6 - 20 mg/dL 25(H) 26(H) 25(H)  Creatinine 0.61 - 1.24 mg/dL 1.07 0.95 0.88  Sodium 135 - 145 mmol/L 136 134(L) 137  Potassium 3.5 - 5.1 mmol/L 3.3(L) 4.3 3.7  Chloride 101 - 111 mmol/L 98(L) 100(L) 100(L)  CO2 22 - 32 mmol/L 26 23 29   Calcium 8.9 - 10.3 mg/dL 7.9(L) 8.3(L) 8.1(L)   IMAGING/STUDIES: V/Q SCAN 9/18:  Very low probability for pulmonary embolism (0 - 9%). UDS 9/18:  Positive Cocaine & Opiates  CT CHEST W/O 9/18:  Personally reviewed by me. No pleural effusion appreciated. No pericardial effusion. Precarinal mediastinal lymph node and large. Suspect subcarinal lymph node enlargement as well. Patchy bilateral alveolar opacities that are groundglass in the mid to upper lung zones and more consolidation within the dependent bilateral lower lobes. TTE 9/22:  Mild concentric LVH with EF 60-65%. Normal regional wall motion. Grade 1 diastolic dysfunction. LA & RA normal in size. RV normal in size and function. No aortic stenosis or regurgitation. Aortic root normal in size. Trivial mitral regurgitation without stenosis. No pulmonic stenosis or regurgitation. Mild tricuspid regurgitation. No pericardial effusion. PORT CXR 9/24:  Previously reviewed by me. Patchy bilateral alveolar opacities with some worsening in the right lower lung. Silhouetting of the right hemidiaphragm which is new compared with earlier chest x-ray imaging. PORT CXR 9/25:  Personally reviewed by me. No pleural effusion appreciated. Slight improvement in bilateral patchy alveolar opacities compared with x-ray imaging from 9/22.  MICROBIOLOGY: MRSA PCR 9/18:  Negative Respiratory Viral Panel PCR 9/18:  Rhinovirus Influenza A/B PCR 9/28:  Negative  Blood Cultures x2 9/18:  1/2 Positive  Streptococcus pneumoniae  Blood Cultures x2 9/22 >>> Sputum Culture 9/23 >>> GPC in clusters  ANTIBIOTICS: Zosyn 9/18 - 9/19 Azithromycin 9/18 - 9/19 Rocephin 9/18 -  9/24 Cefepime 9/24 (x1 dose) Tressie Ellis 9/24 >>> Vancomycin 9/24 >>>  ASSESSMENT/PLAN:  66 y.o. male with acute hypoxic respiratory failure secondary to Streptococcus pneumonia. Patient also with polysubstance abuse. Worsening leukocytosis is concerning but of unclear significance. No new infectious source identified. With the patient's improvement and his portable chest x-ray imaging today I question whether or not this is due to improvement in his pneumonia possibly due to his diuresis yesterday.  1. Acute hypoxic respiratory failure: Requesting nurse obtain incentive spirometer for pulmonary toilette for patient. Continuing flutter valve. Weaning FiO2. Holding on further diuresis today given marginal blood pressure. 2. Streptococcus pneumoniae pneumonia/bacteremia: Currently on day #8 of total antibiotic therapy. 3. Sepsis: Question possible new infectious source. Continuing vancomycin and Fortaz. Awaiting finalization of culture results. Trending Procalcitonin per algorithm. 4. Pleurisy: Continuing scheduled ibuprofen and Prednisone 30mg  daily. Recommend continued monitoring of renal function. Adding Protonix PO daily to regimen. 5. COPD:  No signs of acute exacerbation. Continuing prednisone 30 mg daily & Duonebs every 6 hours.  I have spent a total of 36 minutes of time today caring for the patient, reviewing the patient's electronic medical record, and with more than 50% of that time spent coordinating care with the patient as well as reviewing the continuing plan of care with the patient and his nurse at bedside.  Remainder of care as per primary service.  Sonia Baller Ashok Cordia, M.D. [facility] Pager:  [phone] After 3pm or if no response, call [phone] 10:39 AM 06/03/17

## 2017-06-03 NOTE — Progress Notes (Signed)
PROGRESS NOTE  Travis Salinas KGM:[id number] DOB: 1951/02/22 DOA: 05/27/2017 PCP: Kathyrn Lass, MD   LOS: 7 days   Brief Narrative / Interim history: Travis Salinas is a 66 year old male with a medical history of hypertension, substance abuse, COPD, depression/anxiety, and hepatitis C who presented to the ED on 9/18 for fever and shortness of breath. He was seen at his PCP office 4 days prior and was diagnosed with a COPD flare and treated with doxycyline, prednisone, and a cough syrup. CT scan revealed multifocal pneumonia. Cultures revealed strepococcus pneumoniae and he was started on ceftriaxone. He worsened on 9/22 with an elevation in WBC and was febrile on 9/23. Antibiotics changed yesterday from Ceftriaxone to Vancomycin, Cefepime (one dose), and Ceftazadime, repeat cultures still pending. Patient on high flow O2 through a nasal cannula this morning.   Assessment & Plan: Active Problems:   HTN (hypertension)   Multifocal pneumonia   Hypoxia   Sepsis due to pneumococcus [facility])   Acute respiratory failure (HCC)  Sepsis from  multifocal pneumonia due to Streptococcus pneumoniae -Worsening WBC count since 9/22, today 30.3. Spiked fever of up to 101.1 9/23, but no fevers since.  -Original cultures on 9/18 revealed streptococcus pneumoniae. Given increase in WBC and fevers, repeat blood cultures on 9/22 revealed no growth at 2 days- final pending. Respiratory cultures obtained 9/23 revealed no WBC, rare squamous epithelial cells, and few gram positive cocci in clusters- final pending.  -Continue Vanc and Cefepime until final culture results come back.  -Respiratory panel positive for Rhinovirus. Continue supportive treatment. -CXR done this morning mildly improved from yesterday. Still concerning for ARDS.  -PCCM notified- Holding Lasix due to BP, adding protonix PO.  COPD exacerbation -Due to pneumonia and sepsis.  -Continue duo-neb and predisone  Acute hypoxic respiratory  failure -Due to COPD, pneumonia, and sepsis.  -Requiring high flow nasal cannula O2. -CXR as above  Acute Kidney Injury -Resolved, Cr 1.07 this morning  Hypertension -Stable -Continue current medications  Substance Abuse -Urine drug screen positive for Cocaine  Anxiety/Depression -Patient has been agitated on and off over the last few days. Seems less agitated today.  DVT prophylaxis: Lovenox Code Status: FULL Family Communication: None at bedside Disposition Plan: Once medically stable  Consultants:   PCCM  Procedures:   BiPAP - on high flow nasal cannula this morning.    Antimicrobials:  Zosyn 9/18 - 9/19  Azithromycin 9/18 - 9/19  Ceftriaxone 9/19 - 9/24  Cefepime 9/24 (one dose)  Ceftazadime Tressie Ellis) 9/24 -  Vancomycin 9/24 -  Subjective: Patient still short of breath this morning, and states he felt a little better yesterday. Still having left sided chest pain since admission, unchanged. No other complaints.   Objective: Vitals:   06/03/17 0338 06/03/17 0500 06/03/17 0800 06/03/17 1000  BP:  (!) 132/56 125/63 (!) 106/23  Pulse:  81 92 78  Resp:  19 17 18   Temp: 97.6 F (36.4 C)  97.9 F (36.6 C)   TempSrc: Axillary  Oral   SpO2:  93% 91% 95%  Weight:      Height:        Intake/Output Summary (Last 24 hours) at 06/03/17 1024 Last data filed at 06/03/17 0500  Gross per 24 hour  Intake              500 ml  Output             2525 ml  Net            -  2025 ml   Filed Weights   05/30/17 0345 05/31/17 0404 06/01/17 0339  Weight: 70.1 kg (154 lb 8.7 oz) 69.3 kg (152 lb 12.5 oz) 63 kg (138 lb 14.2 oz)    Examination: Constitutional: Appears uncomfortable, wearing high flow nasal cannula Eyes: lids and conjunctivae normal Respiratory: Coarse breath sounds- left greater than right. No wheezing. Using accessory muscles.  Cardiovascular: Regular rate and rhythm, no murmurs / rubs / gallops. No LE edema. 2+ pedal pulses.   Abdomen: no  tenderness. Bowel sounds positive.  Psychiatric: Alert and oriented x 3.  Data Reviewed: I have independently reviewed following labs and imaging studies.  CBC:  Recent Labs Lab 05/30/17 0307 05/31/17 0306 06/01/17 0309 06/02/17 0306 06/03/17 0710  WBC 15.6* 15.3* 18.1* 24.5* 30.3*  HGB 10.1* 10.5* 9.8* 11.1* 11.3*  HCT 29.2* 30.9* 29.1* 33.6* 34.4*  MCV 80.9 81.1 82.0 82.0 82.7  PLT 263 291 308 286 008   Basic Metabolic Panel:  Recent Labs Lab 05/28/17 0350  05/30/17 0307 05/31/17 0306 06/01/17 0309 06/02/17 0306 06/03/17 0710  NA 137  < > 136 139 137 134* 136  K 3.7  < > 4.0 3.3* 3.7 4.3 3.3*  CL 106  < > 106 106 100* 100* 98*  CO2 22  < > 23 24 29 23 26   GLUCOSE 140*  < > 137* 119* 92 127* 124*  BUN 25*  < > 31* 25* 25* 26* 25*  CREATININE 1.04  < > 0.96 0.87 0.88 0.95 1.07  CALCIUM 8.2*  < > 8.2* 8.5* 8.1* 8.3* 7.9*  MG 1.9  --   --   --  2.3  --  1.9  PHOS 3.1  --   --   --   --   --   --   < > = values in this interval not displayed. GFR: Estimated Creatinine Clearance: 61.3 mL/min (by C-G formula based on SCr of 1.07 mg/dL). Liver Function Tests:  Recent Labs Lab 05/28/17 0350 05/31/17 0306 06/03/17 0710  AST 50* 47* 53*  ALT 12* 18 20  ALKPHOS 39 66 103  BILITOT 0.9 0.6 0.7  PROT 5.8* 5.6* 6.4*  ALBUMIN 2.7* 2.5* 2.4*   Cardiac Enzymes:  Recent Labs Lab 05/27/17 1648 05/27/17 2239 05/28/17 0350  TROPONINI <0.03 <0.03 <0.03   Urine analysis:    Component Value Date/Time   COLORURINE YELLOW 05/27/2017 [place] 05/27/2017 0837   LABSPEC 1.014 05/27/2017 0837   PHURINE 7.0 05/27/2017 0837   GLUCOSEU NEGATIVE 05/27/2017 0837   HGBUR NEGATIVE 05/27/2017 0837   BILIRUBINUR NEGATIVE 05/27/2017 0837   [place] 05/27/2017 0837   PROTEINUR 100 (A) 05/27/2017 0837   UROBILINOGEN 1.0 11/14/2013 1424   NITRITE NEGATIVE 05/27/2017 0837   LEUKOCYTESUR NEGATIVE 05/27/2017 0837   Recent Results (from the past 240  hour(s))  Blood culture (routine x 2)     Status: None   Collection Time: 05/27/17  4:30 AM  Result Value Ref Range Status   Specimen Description BLOOD LEFT ARM  Final   Special Requests   Final    BOTTLES DRAWN AEROBIC AND ANAEROBIC Blood Culture adequate volume   Culture   Final    NO GROWTH 5 DAYS Performed at [facility], [place] [address] [place], [place] [address]    Report Status 06/01/2017 FINAL  Final  Blood culture (routine x 2)     Status: Abnormal   Collection Time: 05/27/17  4:34 AM  Result  Value Ref Range Status   Specimen Description BLOOD RIGHT FOREARM  Final   Special Requests   Final    BOTTLES DRAWN AEROBIC AND ANAEROBIC Blood Culture adequate volume   Culture  Setup Time   Final    GRAM POSITIVE COCCI ANAEROBIC BOTTLE ONLY CRITICAL RESULT CALLED TO, READ BACK BY AND VERIFIED WITH: Damian Leavell [id number] MLM Performed at [facility], [place] [address], [place], [place] [address]    Culture STREPTOCOCCUS PNEUMONIAE (A)  Final   Report Status 05/30/2017 FINAL  Final   Organism ID, Bacteria STREPTOCOCCUS PNEUMONIAE  Final      Susceptibility   Streptococcus pneumoniae - MIC*    ERYTHROMYCIN >=8 RESISTANT Resistant     LEVOFLOXACIN 1 SENSITIVE Sensitive     PENICILLIN (meningitis) 0.12 RESISTANT Resistant     PENICILLIN (non-meningitis) 0.12 SENSITIVE Sensitive     CEFTRIAXONE (non-meningitis) 0.25 SENSITIVE Sensitive     CEFTRIAXONE (meningitis) 0.25 SENSITIVE Sensitive     * STREPTOCOCCUS PNEUMONIAE  Blood Culture ID Panel (Reflexed)     Status: Abnormal   Collection Time: 05/27/17  4:34 AM  Result Value Ref Range Status   Enterococcus species NOT DETECTED NOT DETECTED Final   Vancomycin resistance NOT DETECTED NOT DETECTED Final   Listeria monocytogenes NOT DETECTED NOT DETECTED Final   Staphylococcus species NOT DETECTED NOT DETECTED Final   Staphylococcus aureus NOT DETECTED NOT DETECTED Final   Methicillin resistance NOT DETECTED NOT  DETECTED Final   Streptococcus species DETECTED (A) NOT DETECTED Final    Comment: CRITICAL RESULT CALLED TO, READ BACK BY AND VERIFIED WITH: PHARMD J GADHIA [id number] MLM    Streptococcus agalactiae NOT DETECTED NOT DETECTED Final   Streptococcus pneumoniae DETECTED (A) NOT DETECTED Final    Comment: CRITICAL RESULT CALLED TO, READ BACK BY AND VERIFIED WITH: PHARMD J GADHIA [phone] MLM    Streptococcus pyogenes NOT DETECTED NOT DETECTED Final   Acinetobacter baumannii NOT DETECTED NOT DETECTED Final   Enterobacteriaceae species NOT DETECTED NOT DETECTED Final   Enterobacter cloacae complex NOT DETECTED NOT DETECTED Final   Escherichia coli NOT DETECTED NOT DETECTED Final   Klebsiella oxytoca NOT DETECTED NOT DETECTED Final   Klebsiella pneumoniae NOT DETECTED NOT DETECTED Final   Proteus species NOT DETECTED NOT DETECTED Final   Serratia marcescens NOT DETECTED NOT DETECTED Final   Carbapenem resistance NOT DETECTED NOT DETECTED Final   Haemophilus influenzae NOT DETECTED NOT DETECTED Final   Neisseria meningitidis NOT DETECTED NOT DETECTED Final   Pseudomonas aeruginosa NOT DETECTED NOT DETECTED Final   Candida albicans NOT DETECTED NOT DETECTED Final   Candida glabrata NOT DETECTED NOT DETECTED Final   Candida krusei NOT DETECTED NOT DETECTED Final   Candida parapsilosis NOT DETECTED NOT DETECTED Final   Candida tropicalis NOT DETECTED NOT DETECTED Final    Comment: Performed at [facility], [place]. [address], [place], [place] [address]  MRSA PCR Screening     Status: None   Collection Time: 05/27/17  6:17 PM  Result Value Ref Range Status   MRSA by PCR NEGATIVE NEGATIVE Final    Comment:        The GeneXpert MRSA Assay (FDA approved for NASAL specimens only), is one component of a comprehensive MRSA colonization surveillance program. It is not intended to diagnose MRSA infection nor to guide or monitor treatment for MRSA infections.   Respiratory Panel by PCR      Status: Abnormal  Collection Time: 05/27/17 10:55 PM  Result Value Ref Range Status   Adenovirus NOT DETECTED NOT DETECTED Final   Coronavirus 229E NOT DETECTED NOT DETECTED Final   Coronavirus HKU1 NOT DETECTED NOT DETECTED Final   Coronavirus NL63 NOT DETECTED NOT DETECTED Final   Coronavirus OC43 NOT DETECTED NOT DETECTED Final   Metapneumovirus NOT DETECTED NOT DETECTED Final   Rhinovirus / Enterovirus DETECTED (A) NOT DETECTED Final   Influenza A NOT DETECTED NOT DETECTED Final   Influenza A H1 NOT DETECTED NOT DETECTED Final   Influenza A H1 2009 NOT DETECTED NOT DETECTED Final   Influenza A H3 NOT DETECTED NOT DETECTED Final   Influenza B NOT DETECTED NOT DETECTED Final   Parainfluenza Virus 1 NOT DETECTED NOT DETECTED Final   Parainfluenza Virus 2 NOT DETECTED NOT DETECTED Final   Parainfluenza Virus 3 NOT DETECTED NOT DETECTED Final   Parainfluenza Virus 4 NOT DETECTED NOT DETECTED Final   Respiratory Syncytial Virus NOT DETECTED NOT DETECTED Final   Bordetella pertussis NOT DETECTED NOT DETECTED Final   Chlamydophila pneumoniae NOT DETECTED NOT DETECTED Final   Mycoplasma pneumoniae NOT DETECTED NOT DETECTED Final    Comment: Performed at [facility], [place] [address], [place], [place] [address]  Culture, blood (Routine X 2) w Reflex to ID Panel     Status: None (Preliminary result)   Collection Time: 05/31/17  1:20 PM  Result Value Ref Range Status   Specimen Description BLOOD BLOOD RIGHT HAND  Final   Special Requests   Final    BOTTLES DRAWN AEROBIC ONLY Blood Culture adequate volume   Culture   Final    NO GROWTH 2 DAYS Performed at [facility], [place] [address], [place], [place] [address]    Report Status PENDING  Incomplete  Culture, blood (Routine X 2) w Reflex to ID Panel     Status: None (Preliminary result)   Collection Time: 05/31/17  1:26 PM  Result Value Ref Range Status   Specimen Description BLOOD BLOOD LEFT HAND  Final   Special Requests  IN PEDIATRIC BOTTLE Blood Culture adequate volume  Final   Culture   Final    NO GROWTH 2 DAYS Performed at [facility], [place] [address] [place], [place] [address]    Report Status PENDING  Incomplete  Culture, expectorated sputum-assessment     Status: None   Collection Time: 06/01/17 11:12 AM  Result Value Ref Range Status   Specimen Description SPUTUM  Final   Special Requests NONE  Final   Sputum evaluation THIS SPECIMEN IS ACCEPTABLE FOR SPUTUM CULTURE  Final   Report Status 06/01/2017 FINAL  Final  Culture, respiratory (NON-Expectorated)     Status: None (Preliminary result)   Collection Time: 06/01/17 11:12 AM  Result Value Ref Range Status   Specimen Description SPUTUM  Final   Special Requests NONE Reflexed from [id number]  Final   Gram Stain   Final    NO WBC SEEN RARE SQUAMOUS EPITHELIAL CELLS PRESENT FEW GRAM POSITIVE COCCI IN CLUSTERS    Culture   Final    CULTURE REINCUBATED FOR BETTER GROWTH Performed at [facility], [place] [address], [place], [place] [address]    Report Status PENDING  Incomplete     Radiology Studies: Dg Chest Port 1 View  Result Date: 06/03/2017 CLINICAL DATA:  Pneumonia EXAM: PORTABLE CHEST 1 VIEW COMPARISON:  06/02/2017 FINDINGS: Diffuse but patchy interstitial and airspace opacity that is mildly  improved from yesterday. No effusion or pneumothorax. Normal heart size for technique. IMPRESSION: History of pneumonia with mild interval improvement in bilateral airspace disease. Opacity is widespread and ARDS is also considered. Electronically Signed   By: Monte Fantasia M.D.   On: 06/03/2017 07:18   Dg Chest Port 1 View  Result Date: 06/02/2017 CLINICAL DATA:  Community-acquired pneumonia. EXAM: PORTABLE CHEST 1 VIEW COMPARISON:  Radiograph 05/31/2017.  CT 01/24/2017 FINDINGS: Worsening diffuse bilateral airspace disease with increasing opacities in the upper lung zones. Unchanged heart size and mediastinal contour. No pleural fluid. No  pneumothorax. No acute osseous abnormalities are seen. IMPRESSION: Worsening bilateral airspace opacities concerning for progressive pneumonia. ARDS or less likely pulmonary edema could have a similar appearance. Electronically Signed   By: Jeb Levering M.D.   On: 06/02/2017 06:00   Scheduled Meds: . amLODipine  10 mg Oral q morning - 10a  . ARIPiprazole  20 mg Oral Daily  . aspirin EC  81 mg Oral Daily  . atenolol  25 mg Oral BH-q7a  . clonazePAM  0.5 mg Oral BID  . dextromethorphan-guaiFENesin  1 tablet Oral BID  . enoxaparin (LOVENOX) injection  40 mg Subcutaneous Q24H  . feeding supplement (ENSURE ENLIVE)  237 mL Oral BID BM  . FLUoxetine  20 mg Oral Daily  . ibuprofen  600 mg Oral QID  . ipratropium-albuterol  3 mL Nebulization Q6H  . lamoTRIgine  200 mg Oral q morning - 10a  . predniSONE  30 mg Oral Q breakfast  . tamsulosin  0.4 mg Oral Daily   Continuous Infusions: . sodium chloride 10 mL/hr at 06/03/17 0500  . cefTAZidime (FORTAZ)  IV Stopped (06/03/17 0145)  . vancomycin Stopped (06/02/17 2204)    Verlin Dike, PA-S  Marzetta Board, MD, PhD [facility] Pager [phone]  If 7PM-7AM, please contact night-coverage [email or web address] Password [facility] 06/03/2017, 10:24 AM

## 2017-06-03 NOTE — Consult Note (Signed)
   [facility] Inpatient Consult   06/03/2017  Kaipo Ardis [birth year] [phone]   [facility] Care Management/Link to Wellness follow up for  [employer] employees/dependents with [facility].   Mr. Swanson Farnell remains on stepdown unit. Will follow up with him at a more appropriate time to provide Link to Wellness information.   [name], MSN-Ed, RN,BSN [facility] Liaison [phone]

## 2017-06-04 ENCOUNTER — Inpatient Hospital Stay (HOSPITAL_COMMUNITY): Payer: 59

## 2017-06-04 DIAGNOSIS — Z881 Allergy status to other antibiotic agents status: Secondary | ICD-10-CM

## 2017-06-04 DIAGNOSIS — J13 Pneumonia due to Streptococcus pneumoniae: Secondary | ICD-10-CM

## 2017-06-04 DIAGNOSIS — N182 Chronic kidney disease, stage 2 (mild): Secondary | ICD-10-CM

## 2017-06-04 DIAGNOSIS — B953 Streptococcus pneumoniae as the cause of diseases classified elsewhere: Secondary | ICD-10-CM

## 2017-06-04 DIAGNOSIS — F1910 Other psychoactive substance abuse, uncomplicated: Secondary | ICD-10-CM

## 2017-06-04 DIAGNOSIS — D649 Anemia, unspecified: Secondary | ICD-10-CM | POA: Diagnosis present

## 2017-06-04 DIAGNOSIS — Z823 Family history of stroke: Secondary | ICD-10-CM

## 2017-06-04 DIAGNOSIS — J441 Chronic obstructive pulmonary disease with (acute) exacerbation: Secondary | ICD-10-CM

## 2017-06-04 DIAGNOSIS — Z8249 Family history of ischemic heart disease and other diseases of the circulatory system: Secondary | ICD-10-CM

## 2017-06-04 DIAGNOSIS — F329 Major depressive disorder, single episode, unspecified: Secondary | ICD-10-CM

## 2017-06-04 DIAGNOSIS — F1410 Cocaine abuse, uncomplicated: Secondary | ICD-10-CM

## 2017-06-04 DIAGNOSIS — Z8619 Personal history of other infectious and parasitic diseases: Secondary | ICD-10-CM

## 2017-06-04 DIAGNOSIS — F1110 Opioid abuse, uncomplicated: Secondary | ICD-10-CM

## 2017-06-04 DIAGNOSIS — Z888 Allergy status to other drugs, medicaments and biological substances status: Secondary | ICD-10-CM

## 2017-06-04 DIAGNOSIS — J449 Chronic obstructive pulmonary disease, unspecified: Secondary | ICD-10-CM | POA: Diagnosis present

## 2017-06-04 DIAGNOSIS — N401 Enlarged prostate with lower urinary tract symptoms: Secondary | ICD-10-CM

## 2017-06-04 DIAGNOSIS — R6510 Systemic inflammatory response syndrome (SIRS) of non-infectious origin without acute organ dysfunction: Secondary | ICD-10-CM

## 2017-06-04 DIAGNOSIS — D72829 Elevated white blood cell count, unspecified: Secondary | ICD-10-CM

## 2017-06-04 DIAGNOSIS — F418 Other specified anxiety disorders: Secondary | ICD-10-CM

## 2017-06-04 DIAGNOSIS — Z9981 Dependence on supplemental oxygen: Secondary | ICD-10-CM

## 2017-06-04 DIAGNOSIS — Z87891 Personal history of nicotine dependence: Secondary | ICD-10-CM

## 2017-06-04 LAB — BASIC METABOLIC PANEL
Anion gap: 11 (ref 5–15)
BUN: 24 mg/dL — ABNORMAL HIGH (ref 6–20)
CHLORIDE: 99 mmol/L — AB (ref 101–111)
CO2: 27 mmol/L (ref 22–32)
Calcium: 7.9 mg/dL — ABNORMAL LOW (ref 8.9–10.3)
Creatinine, Ser: 0.98 mg/dL (ref 0.61–1.24)
GFR calc Af Amer: >60 mL/min (ref >60)
GFR calc non Af Amer: >60 mL/min (ref >60)
GLUCOSE: 119 mg/dL — AB (ref 65–99)
POTASSIUM: 3.2 mmol/L — AB (ref 3.5–5.1)
Sodium: 137 mmol/L (ref 135–145)

## 2017-06-04 LAB — VANCOMYCIN, TROUGH: Vancomycin Tr: 12 ug/mL — ABNORMAL LOW (ref 15–20)

## 2017-06-04 LAB — CBC
HEMATOCRIT: 33.5 % — AB (ref 39.0–52.0)
Hemoglobin: 10.8 g/dL — ABNORMAL LOW (ref 13.0–17.0)
MCH: 26.9 pg (ref 26.0–34.0)
MCHC: 32.2 g/dL (ref 30.0–36.0)
MCV: 83.3 fL (ref 78.0–100.0)
Platelets: 237 10*3/uL (ref 150–400)
RBC: 4.02 MIL/uL — AB (ref 4.22–5.81)
RDW: 14.2 % (ref 11.5–15.5)
WBC: 26.7 10*3/uL — AB (ref 4.0–10.5)

## 2017-06-04 LAB — BRAIN NATRIURETIC PEPTIDE: B Natriuretic Peptide: 60.1 pg/mL (ref 0.0–100.0)

## 2017-06-04 LAB — PROCALCITONIN: Procalcitonin: 0.38 ng/mL

## 2017-06-04 LAB — CREATININE, SERUM
Creatinine, Ser: 0.99 mg/dL (ref 0.61–1.24)
GFR calc Af Amer: >60 mL/min (ref >60)
GFR calc non Af Amer: >60 mL/min (ref >60)

## 2017-06-04 MED ORDER — ORAL CARE MOUTH RINSE
15.0000 mL | Freq: Two times a day (BID) | OROMUCOSAL | Status: DC
  Administered 2017-06-04 – 2017-06-10 (×9): 15 mL via OROMUCOSAL

## 2017-06-04 MED ORDER — FUROSEMIDE 10 MG/ML IJ SOLN
20.0000 mg | Freq: Once | INTRAMUSCULAR | Status: AC
Start: 2017-06-04 — End: 2017-06-04
  Administered 2017-06-04: 20 mg via INTRAVENOUS
  Filled 2017-06-04: qty 2

## 2017-06-04 MED ORDER — CHLORHEXIDINE GLUCONATE 0.12 % MT SOLN
15.0000 mL | Freq: Two times a day (BID) | OROMUCOSAL | Status: DC
Start: 2017-06-04 — End: 2017-06-13
  Administered 2017-06-04 – 2017-06-13 (×19): 15 mL via OROMUCOSAL
  Filled 2017-06-04 (×17): qty 15

## 2017-06-04 MED ORDER — DEXTROSE 5 % IV SOLN
2.0000 g | INTRAVENOUS | Status: DC
  Administered 2017-06-04 – 2017-06-10 (×7): 2 g via INTRAVENOUS
  Filled 2017-06-04 (×7): qty 2

## 2017-06-04 MED ORDER — FUROSEMIDE 10 MG/ML IJ SOLN
40.0000 mg | Freq: Four times a day (QID) | INTRAMUSCULAR | Status: AC
  Administered 2017-06-04 – 2017-06-05 (×3): 40 mg via INTRAVENOUS
  Filled 2017-06-04 (×3): qty 4

## 2017-06-04 MED ORDER — METHYLPREDNISOLONE SODIUM SUCC 40 MG IJ SOLR
40.0000 mg | Freq: Three times a day (TID) | INTRAMUSCULAR | Status: DC
  Administered 2017-06-04 – 2017-06-06 (×6): 40 mg via INTRAVENOUS
  Filled 2017-06-04 (×6): qty 1

## 2017-06-04 MED ORDER — VANCOMYCIN HCL IN DEXTROSE 1-5 GM/200ML-% IV SOLN
1000.0000 mg | Freq: Two times a day (BID) | INTRAVENOUS | Status: DC
  Administered 2017-06-04: 1000 mg via INTRAVENOUS
  Filled 2017-06-04: qty 200

## 2017-06-04 MED ORDER — LIP MEDEX EX OINT
TOPICAL_OINTMENT | CUTANEOUS | Status: AC
  Administered 2017-06-04: 14:00:00
  Filled 2017-06-04: qty 7

## 2017-06-04 MED ORDER — ORAL CARE MOUTH RINSE: 15.0000 mL | Freq: Two times a day (BID) | OROMUCOSAL | Status: DC

## 2017-06-04 NOTE — Progress Notes (Signed)
Pharmacy Antibiotic Note  Travis Salinas [name] is a 66 y.o. male currently on day #9 antibiotics for S.pneumoniae bacteremia and pneumonia.  Antibiotics broadened to ceftazidime and vancomycin 9/24 for worsening CXR concerning for progressive pneumonia and worsening leukocytosis.  Vancomycin trough = 12 mcg/ml on 750 mg IV q12h dosing (goal 15-20 mcg/ml) WBC increasing, however, PCT improving and afebrile  Plan: Adjust Vancomycin to 1g IV q12h Continue Ceftazidime 2g IV q8h F/u SCr Consider ID consult _________________________________  Height: 6' (182.9 cm) Weight: 138 lb 14.2 oz (63 kg) IBW/kg (Calculated) : 77.6  Temp (24hrs), Avg:98 F (36.7 C), Min:97.8 F (36.6 C), Max:98.2 F (36.8 C)   Recent Labs Lab 05/30/17 0307 05/31/17 0306 06/01/17 0309 06/02/17 0306 06/03/17 0710 06/04/17 0319  WBC 15.6* 15.3* 18.1* 24.5* 30.3*  --   CREATININE 0.96 0.87 0.88 0.95 1.07 0.99    Estimated Creatinine Clearance: 66.3 mL/min (by C-G formula based on SCr of 0.99 mg/dL).    Allergies  Allergen Reactions  . Propoxyphene Nausea And Vomiting  . Amoxicillin Nausea And Vomiting  . Ciprofloxacin Nausea Only  . Depakote [Divalproex Sodium] Other (See Comments)    hallucinations    Antimicrobials this admission:  PTA doxy 9/18 Rocephin x 1 9/18 Azithro >> 9/19 9/18 Zosyn >> 9/19 9/19 Rocephin 2g >>9/24 9/24 vanc>> 9/24 ceftazidime>>  Dose adjustments this admission:  9/26 VT = 12 mcg/ml  (on 750 mg q12h)  Microbiology results:  9/18 BCx: 1/4 bottles Strep pneumoniae (R =erytho, PCN) 9/18 Influenza panel: neg 9/18 Respiratory panel:+rhinovirus 9/18 MRSA PCR: neg 9/23 sputum: normal resp flora 9/22 bcx x2: ngtd   Thank you for allowing pharmacy to be a part of this patient's care.  Hershal Coria 06/04/2017 11:12 AM

## 2017-06-04 NOTE — Progress Notes (Signed)
K. Schorr,NP paged and notified about pt having approx 15bt run of SVT. Pt's VS remain stable and pt has no complaints at this time.

## 2017-06-04 NOTE — Progress Notes (Signed)
Patient restless with mild agitation, haldol 2 mg  Per order. Patient remains on Bi-pap, Sat's at 93-96.

## 2017-06-04 NOTE — Progress Notes (Signed)
When patient woke up, he had pulled his O2 off and his O2 sats dropped to 60s. Placed O2 back in nares but was slow to rise. Increased HFNC to 30% w/ 80 FiO2  & gave 0.5mg  IV ativan.  Paged respiratory therapist to assess. RT came & increased FiO2 to 100%. Continue to monitor.

## 2017-06-04 NOTE — Progress Notes (Signed)
PROGRESS NOTE    Travis Salinas  JME:[id number] DOB: Oct 24, 1950 DOA: 05/27/2017 PCP: Kathyrn Lass, MD   Brief Narrative: 66 y.o.malewith medical history significant of hypertension, COPD, depression/anxiety and BPH who presented to the emergency department complaining of progressive worsening shortness of breath for the past 4 days. Patient was seen by his PCP 4 days ago and was diagnosed with COPD exacerbation. She was placed on doxycycline, prednisone and cough syrup. The morning of admission breathing got acutely worse and he developed fevers and chills. CT scan showed multifocal pneumonia. Given profound hypoxia he was admitted to stepdown and PCCM was consulted. He initially had some improvement however over the last few days he became febrile again, and more hypoxic. Still requiring BiPAP   Assessment & Plan:   Active Problems:   HTN (hypertension)   Multifocal pneumonia   Hypoxia   Sepsis due to pneumococcus [facility])   Acute respiratory failure (HCC)   Sepsis in the setting of multifocal pneumonia due to Streptococcus pneumoniae -Blood cultures with gram-positive cocci, Streptococcus pneumoniae 1 out of 2 bottles. No routine 2D echo is required for Streptococcus pneumonia bacteremia, discussed with ID on 9/20.  -Respiratory virus panel sent, came back positive with rhinovirus. Supportive treatment -Patient was febrile on admission, fever has resolved for a few days however starting 9/22 he started spiking again. He also has increased leukocytosis which continues to go up. Blood cultures were sent on 9/22; no growth to date.  -appreciate critical care consultation, patient's antibiotics were broadened to vancomycin and cefepime on 9/24 -Sputum cultures consistent with normal respiratory flora. -Continue with IV antibiotics. Await WBC>   Acute hypoxic respiratory failure, developing ARDS -Due to #1 and #2 -Received IV Lasix on 9-24, chest x-ray 9-25 still with ARDS  appearance however slightly improved -More hypoxic overnight. Was off oxygen. Now sat in the highs 87 % on 40  % HFNS.  -check BNP.  -will give one time dose if IV lasix.  -CCM to see patient this morning.  He will be place on BIPAP  Streptococcus Pneumoniae  Bacteremia;  On IV antibiotics.  Repeated blood culture 9-22 no growth to date.   COPD exacerbation -In the setting of #1, no further wheezing, continue duo nebs, change Solu-Medrol to prednisone -Critical care followed patient, signed off on 9/21, re-consulted 9/22 due to worsening respiratory status.   AKI on CKD 2 -Acute kidney injury resolved, creatinine is back to his baseline -Cr stable at 0.99  HTN -continue with atenolol. Hold Norvasc in event of worsening infection.   Depression/anxiety -Continue home medications  BPH -Continue home medications  Polysubstance abuse -UDS positive for cocaine.     DVT prophylaxis: Lovenox.  Code Status: Full code.  Family Communication: care discussed with patient.  Disposition Plan: Remain in the step down.    Consultants:   CCM   Procedures:   none   Antimicrobials:   Vancomycin 9-24  Fortaz. 9-24   Subjective: He is complaining of dyspnea. He is always short of breath.  He relates, if it wouldn't be for the episode this morning, I am ok.  He report BM, first since admission.    Objective: Vitals:   06/04/17 0400 06/04/17 0500 06/04/17 0600 06/04/17 0646  BP: (!) 133/56 (!) 137/55 (!) 133/52   Pulse: 81 84 (!) 108 95  Resp: 14 15 (!) 27 14  Temp: 98.1 F (36.7 C)     TempSrc: Oral     SpO2: 90% (!) 87% Marland Kitchen)  73% 93%  Weight:      Height:        Intake/Output Summary (Last 24 hours) at 06/04/17 0733 Last data filed at 06/04/17 0520  Gross per 24 hour  Intake             1060 ml  Output             1250 ml  Net             -190 ml   Filed Weights   05/30/17 0345 05/31/17 0404 06/01/17 0339  Weight: 70.1 kg (154 lb 8.7 oz) 69.3 kg  (152 lb 12.5 oz) 63 kg (138 lb 14.2 oz)    Examination:  General exam: restless, NAD.  Respiratory system:  Tachypnea, bilateral air movement, no wheezing.  Cardiovascular system: S1 & S2 heard, RRR. No JVD, murmurs, rubs, gallops or clicks. No pedal edema. Gastrointestinal system: Abdomen is nondistended, soft and nontender. No organomegaly or masses felt. Normal bowel sounds heard. Central nervous system: Alert and oriented. No focal neurological deficits. Extremities: Symmetric 5 x 5 power. Skin: No rashes, lesions or ulcers   Data Reviewed: I have personally reviewed following labs and imaging studies  CBC:  Recent Labs Lab 05/30/17 0307 05/31/17 0306 06/01/17 0309 06/02/17 0306 06/03/17 0710  WBC 15.6* 15.3* 18.1* 24.5* 30.3*  HGB 10.1* 10.5* 9.8* 11.1* 11.3*  HCT 29.2* 30.9* 29.1* 33.6* 34.4*  MCV 80.9 81.1 82.0 82.0 82.7  PLT 263 291 308 286 409   Basic Metabolic Panel:  Recent Labs Lab 05/30/17 0307 05/31/17 0306 06/01/17 0309 06/02/17 0306 06/03/17 0710 06/04/17 0319  NA 136 139 137 134* 136  --   K 4.0 3.3* 3.7 4.3 3.3*  --   CL 106 106 100* 100* 98*  --   CO2 23 24 29 23 26   --   GLUCOSE 137* 119* 92 127* 124*  --   BUN 31* 25* 25* 26* 25*  --   CREATININE 0.96 0.87 0.88 0.95 1.07 0.99  CALCIUM 8.2* 8.5* 8.1* 8.3* 7.9*  --   MG  --   --  2.3  --  1.9  --    GFR: Estimated Creatinine Clearance: 66.3 mL/min (by C-G formula based on SCr of 0.99 mg/dL). Liver Function Tests:  Recent Labs Lab 05/31/17 0306 06/03/17 0710  AST 47* 53*  ALT 18 20  ALKPHOS 66 103  BILITOT 0.6 0.7  PROT 5.6* 6.4*  ALBUMIN 2.5* 2.4*   No results for input(s): LIPASE, AMYLASE in the last 168 hours. No results for input(s): AMMONIA in the last 168 hours. Coagulation Profile: No results for input(s): INR, PROTIME in the last 168 hours. Cardiac Enzymes: No results for input(s): CKTOTAL, CKMB, CKMBINDEX, TROPONINI in the last 168 hours. BNP (last 3 results) No  results for input(s): PROBNP in the last 8760 hours. HbA1C: No results for input(s): HGBA1C in the last 72 hours. CBG: No results for input(s): GLUCAP in the last 168 hours. Lipid Profile: No results for input(s): CHOL, HDL, LDLCALC, TRIG, CHOLHDL, LDLDIRECT in the last 72 hours. Thyroid Function Tests: No results for input(s): TSH, T4TOTAL, FREET4, T3FREE, THYROIDAB in the last 72 hours. Anemia Panel: No results for input(s): VITAMINB12, FOLATE, FERRITIN, TIBC, IRON, RETICCTPCT in the last 72 hours. Sepsis Labs:  Recent Labs Lab 05/31/17 0306 06/02/17 0306 06/03/17 0710 06/04/17 0319  PROCALCITON 0.75 0.43 0.48 0.38    Recent Results (from the past 240 hour(s))  Blood culture (routine x 2)  Status: None   Collection Time: 05/27/17  4:30 AM  Result Value Ref Range Status   Specimen Description BLOOD LEFT ARM  Final   Special Requests   Final    BOTTLES DRAWN AEROBIC AND ANAEROBIC Blood Culture adequate volume   Culture   Final    NO GROWTH 5 DAYS Performed at [facility], [address] [place], [place] [address]    Report Status 06/01/2017 FINAL  Final  Blood culture (routine x 2)     Status: Abnormal   Collection Time: 05/27/17  4:34 AM  Result Value Ref Range Status   Specimen Description BLOOD RIGHT FOREARM  Final   Special Requests   Final    BOTTLES DRAWN AEROBIC AND ANAEROBIC Blood Culture adequate volume   Culture  Setup Time   Final    GRAM POSITIVE COCCI ANAEROBIC BOTTLE ONLY CRITICAL RESULT CALLED TO, READ BACK BY AND VERIFIED WITH: Damian Leavell [id number] MLM Performed at [facility], [place] [address], [place],  [address]    Culture STREPTOCOCCUS PNEUMONIAE (A)  Final   Report Status 05/30/2017 FINAL  Final   Organism ID, Bacteria STREPTOCOCCUS PNEUMONIAE  Final      Susceptibility   Streptococcus pneumoniae - MIC*    ERYTHROMYCIN >=8 RESISTANT Resistant     LEVOFLOXACIN 1 SENSITIVE Sensitive     PENICILLIN (meningitis) 0.12  RESISTANT Resistant     PENICILLIN (non-meningitis) 0.12 SENSITIVE Sensitive     CEFTRIAXONE (non-meningitis) 0.25 SENSITIVE Sensitive     CEFTRIAXONE (meningitis) 0.25 SENSITIVE Sensitive     * STREPTOCOCCUS PNEUMONIAE  Blood Culture ID Panel (Reflexed)     Status: Abnormal   Collection Time: 05/27/17  4:34 AM  Result Value Ref Range Status   Enterococcus species NOT DETECTED NOT DETECTED Final   Vancomycin resistance NOT DETECTED NOT DETECTED Final   Listeria monocytogenes NOT DETECTED NOT DETECTED Final   Staphylococcus species NOT DETECTED NOT DETECTED Final   Staphylococcus aureus NOT DETECTED NOT DETECTED Final   Methicillin resistance NOT DETECTED NOT DETECTED Final   Streptococcus species DETECTED (A) NOT DETECTED Final    Comment: CRITICAL RESULT CALLED TO, READ BACK BY AND VERIFIED WITH: PHARMD J GADHIA [id number] MLM    Streptococcus agalactiae NOT DETECTED NOT DETECTED Final   Streptococcus pneumoniae DETECTED (A) NOT DETECTED Final    Comment: CRITICAL RESULT CALLED TO, READ BACK BY AND VERIFIED WITH: PHARMD J GADHIA [id number] MLM    Streptococcus pyogenes NOT DETECTED NOT DETECTED Final   Acinetobacter baumannii NOT DETECTED NOT DETECTED Final   Enterobacteriaceae species NOT DETECTED NOT DETECTED Final   Enterobacter cloacae complex NOT DETECTED NOT DETECTED Final   Escherichia coli NOT DETECTED NOT DETECTED Final   Klebsiella oxytoca NOT DETECTED NOT DETECTED Final   Klebsiella pneumoniae NOT DETECTED NOT DETECTED Final   Proteus species NOT DETECTED NOT DETECTED Final   Serratia marcescens NOT DETECTED NOT DETECTED Final   Carbapenem resistance NOT DETECTED NOT DETECTED Final   Haemophilus influenzae NOT DETECTED NOT DETECTED Final   Neisseria meningitidis NOT DETECTED NOT DETECTED Final   Pseudomonas aeruginosa NOT DETECTED NOT DETECTED Final   Candida albicans NOT DETECTED NOT DETECTED Final   Candida glabrata NOT DETECTED NOT DETECTED Final   Candida  krusei NOT DETECTED NOT DETECTED Final   Candida parapsilosis NOT DETECTED NOT DETECTED Final   Candida tropicalis NOT DETECTED NOT DETECTED Final    Comment: Performed at [facility],  [address], [place], [place] [address]  MRSA PCR Screening     Status: None   Collection Time: 05/27/17  6:17 PM  Result Value Ref Range Status   MRSA by PCR NEGATIVE NEGATIVE Final    Comment:        The GeneXpert MRSA Assay (FDA approved for NASAL specimens only), is one component of a comprehensive MRSA colonization surveillance program. It is not intended to diagnose MRSA infection nor to guide or monitor treatment for MRSA infections.   Respiratory Panel by PCR     Status: Abnormal   Collection Time: 05/27/17 10:55 PM  Result Value Ref Range Status   Adenovirus NOT DETECTED NOT DETECTED Final   Coronavirus 229E NOT DETECTED NOT DETECTED Final   Coronavirus HKU1 NOT DETECTED NOT DETECTED Final   Coronavirus NL63 NOT DETECTED NOT DETECTED Final   Coronavirus OC43 NOT DETECTED NOT DETECTED Final   Metapneumovirus NOT DETECTED NOT DETECTED Final   Rhinovirus / Enterovirus DETECTED (A) NOT DETECTED Final   Influenza A NOT DETECTED NOT DETECTED Final   Influenza A H1 NOT DETECTED NOT DETECTED Final   Influenza A H1 2009 NOT DETECTED NOT DETECTED Final   Influenza A H3 NOT DETECTED NOT DETECTED Final   Influenza B NOT DETECTED NOT DETECTED Final   Parainfluenza Virus 1 NOT DETECTED NOT DETECTED Final   Parainfluenza Virus 2 NOT DETECTED NOT DETECTED Final   Parainfluenza Virus 3 NOT DETECTED NOT DETECTED Final   Parainfluenza Virus 4 NOT DETECTED NOT DETECTED Final   Respiratory Syncytial Virus NOT DETECTED NOT DETECTED Final   Bordetella pertussis NOT DETECTED NOT DETECTED Final   Chlamydophila pneumoniae NOT DETECTED NOT DETECTED Final   Mycoplasma pneumoniae NOT DETECTED NOT DETECTED Final    Comment: Performed at [facility], [place]. [address] [place], [place] [address]    Culture, blood (Routine X 2) w Reflex to ID Panel     Status: None (Preliminary result)   Collection Time: 05/31/17  1:20 PM  Result Value Ref Range Status   Specimen Description BLOOD BLOOD RIGHT HAND  Final   Special Requests   Final    BOTTLES DRAWN AEROBIC ONLY Blood Culture adequate volume   Culture   Final    NO GROWTH 3 DAYS Performed at [facility], [place] [address], [place], [place] [address]    Report Status PENDING  Incomplete  Culture, blood (Routine X 2) w Reflex to ID Panel     Status: None (Preliminary result)   Collection Time: 05/31/17  1:26 PM  Result Value Ref Range Status   Specimen Description BLOOD BLOOD LEFT HAND  Final   Special Requests IN PEDIATRIC BOTTLE Blood Culture adequate volume  Final   Culture   Final    NO GROWTH 3 DAYS Performed at [facility], [place] [address] [place],  [address]    Report Status PENDING  Incomplete  Culture, expectorated sputum-assessment     Status: None   Collection Time: 06/01/17 11:12 AM  Result Value Ref Range Status   Specimen Description SPUTUM  Final   Special Requests NONE  Final   Sputum evaluation THIS SPECIMEN IS ACCEPTABLE FOR SPUTUM CULTURE  Final   Report Status 06/01/2017 FINAL  Final  Culture, respiratory (NON-Expectorated)     Status: None   Collection Time: 06/01/17 11:12 AM  Result Value Ref Range Status   Specimen Description SPUTUM  Final   Special Requests NONE Reflexed from [id number]  Final   Gram Stain   Final    NO WBC SEEN RARE SQUAMOUS EPITHELIAL CELLS PRESENT FEW GRAM POSITIVE COCCI IN CLUSTERS    Culture   Final    Consistent with normal respiratory flora. Performed at [facility], [place] [address], [place], [place] [address]    Report Status 06/03/2017 FINAL  Final         Radiology Studies: Dg Chest Port 1 View  Result Date: 06/03/2017 CLINICAL DATA:  Pneumonia EXAM: PORTABLE CHEST 1 VIEW COMPARISON:  06/02/2017 FINDINGS: Diffuse but patchy interstitial and  airspace opacity that is mildly improved from yesterday. No effusion or pneumothorax. Normal heart size for technique. IMPRESSION: History of pneumonia with mild interval improvement in bilateral airspace disease. Opacity is widespread and ARDS is also considered. Electronically Signed   By: Monte Fantasia M.D.   On: 06/03/2017 07:18        Scheduled Meds: . amLODipine  10 mg Oral q morning - 10a  . ARIPiprazole  20 mg Oral Daily  . aspirin EC  81 mg Oral Daily  . atenolol  25 mg Oral BH-q7a  . clonazePAM  0.5 mg Oral BID  . dextromethorphan-guaiFENesin  1 tablet Oral BID  . enoxaparin (LOVENOX) injection  40 mg Subcutaneous Q24H  . feeding supplement (ENSURE ENLIVE)  237 mL Oral BID BM  . FLUoxetine  20 mg Oral Daily  . ibuprofen  600 mg Oral QID  . ipratropium-albuterol  3 mL Nebulization Q6H  . lamoTRIgine  200 mg Oral q morning - 10a  . pantoprazole  40 mg Oral Daily  . predniSONE  30 mg Oral Q breakfast  . tamsulosin  0.4 mg Oral Daily   Continuous Infusions: . sodium chloride 10 mL/hr at 06/03/17 1300  . cefTAZidime (FORTAZ)  IV Stopped (06/04/17 0137)  . vancomycin Stopped (06/03/17 2242)     LOS: 8 days    Time spent: 35 minutes.     Elmarie Shiley, MD [facility] Pager [phone]  If 7PM-7AM, please contact night-coverage [email or web address] Password [facility] 06/04/2017, 7:33 AM

## 2017-06-04 NOTE — Consult Note (Signed)
[facility]    Date of Admission:  05/27/2017   Total days of antibiotics 10        Day 3 vancomycin        Day 3 ceftazidime              Reason for Consult: Bacteremic pneumococcal pneumonia    Referring Provider: Dr. Niel Hummer  Assessment: [name] has bacteremic pneumococcal pneumonia and is improving slowly. He defervesced promptly after admission and then had one day of low-grade fevers. He has had some leukocytosis that is probably secondary to his infection and steroids. I do not think that he has any superimposed healthcare associated pneumonia or other infections and would favor narrowing back to ceftriaxone. I will give him 14 days of total therapy but we can consider conversion to an oral regimen soon if he continues to improve.  Plan: 1. Narrow antibiotic therapy back to ceftriaxone   Principal Problem:   Pneumococcal pneumonia (HCC) Active Problems:   Pneumococcal bacteremia   Acute respiratory failure (HCC)   HTN (hypertension)   Chronic hepatitis C without hepatic coma (HCC)   Substance abuse   IVDU (intravenous drug user)   Normocytic anemia   COPD (chronic obstructive pulmonary disease) ([place])   . ARIPiprazole  20 mg Oral Daily  . aspirin EC  81 mg Oral Daily  . atenolol  25 mg Oral BH-q7a  . chlorhexidine  15 mL Mouth Rinse BID  . clonazePAM  0.5 mg Oral BID  . dextromethorphan-guaiFENesin  1 tablet Oral BID  . enoxaparin (LOVENOX) injection  40 mg Subcutaneous Q24H  . feeding supplement (ENSURE ENLIVE)  237 mL Oral BID BM  . FLUoxetine  20 mg Oral Daily  . furosemide  40 mg Intravenous Q6H  . ibuprofen  600 mg Oral QID  . ipratropium-albuterol  3 mL Nebulization Q6H  . lamoTRIgine  200 mg Oral q morning - 10a  . mouth rinse  15 mL Mouth Rinse q12n4p  . mouth rinse  15 mL Mouth Rinse q12n4p  . methylPREDNISolone (SOLU-MEDROL) injection  40 mg Intravenous Q8H  . pantoprazole  40 mg Oral Daily  . tamsulosin   0.4 mg Oral Daily    HPI: Travis Salinas is a 66 y.o. male with a history of COPD. He recently began to become extremely fatigued and then developed increased cough and shortness of breath. He was seen by his primary care provider, Kathyrn Lass and started on prednisone and doxycycline but had worsening shortness of breath, fever and chills leading to admission on 05/26/2014. Temperature 101.7 degrees upon admission. Chest x-ray showed bilateral infiltrates. He was started on ceftriaxone and azithromycin. One of 2 admission blood cultures grew pneumococcus sensitive to penicillin and ceftriaxone. His respiratory virus panel was positive for rhinovirus. Azithromycin was stopped. He developed low-grade fever to 101.2 on 05/31/2017. Repeat blood cultures were negative. Sputum culture showed normal oral flora only. He defervesced spontaneously. Because of the fever his antibiotics were broadened 3 days ago to vancomycin and ceftazidime. Today he says he is feeling much better.  He has a history of injecting drug use and polysubstance use in the past. He denies any injecting drug use since before he started treatment for hepatitis C in 2016. He did have cocaine and opiates in his urine drug screen on admission. He failed hepatitis C therapy with Harvoni but was retreated with Mavyret last year and had a  sustained virologic remission at week 24 indicating probable cure. He was HIV antibody negative this past February.   Review of Systems: Review of Systems  Constitutional: Positive for malaise/fatigue and weight loss. Negative for chills, diaphoresis and fever.  HENT: Negative for sore throat.   Respiratory: Positive for cough, hemoptysis, sputum production and shortness of breath. Negative for wheezing.   Cardiovascular: Positive for chest pain.  Gastrointestinal: Negative for abdominal pain, diarrhea, heartburn, nausea and vomiting.  Genitourinary: Negative for dysuria.  Musculoskeletal: Negative  for back pain, joint pain and myalgias.  Skin: Negative for rash.  Neurological: Positive for weakness. Negative for dizziness and headaches.  Psychiatric/Behavioral: Positive for depression and substance abuse. The patient is nervous/anxious.     Past Medical History:  Diagnosis Date  . Arthritis   . Cervical disc herniation   . COPD (chronic obstructive pulmonary disease) ([place])   . Headache    " due to antibiotics"  . Hepatitis C   . Hypertension     Social History  Substance Use Topics  . Smoking status: Former Smoker    Packs/day: 0.50    Years: 25.00    Types: Cigarettes  . Smokeless tobacco: Never Used  . Alcohol use No    Family History  Problem Relation Age of Onset  . Heart disease Mother   . Stroke Mother   . Heart disease Father   . Stroke Father    Allergies  Allergen Reactions  . Propoxyphene Nausea And Vomiting  . Amoxicillin Nausea And Vomiting  . Ciprofloxacin Nausea Only  . Depakote [Divalproex Sodium] Other (See Comments)    hallucinations    OBJECTIVE: Blood pressure 133/65, pulse 91, temperature 98.6 F (37 C), temperature source Axillary, resp. rate 20, height 6' (1.829 m), weight 138 lb 14.2 oz (63 kg), SpO2 100 %.  Physical Exam  Constitutional: He is oriented to person, place, and time.  He is alert and in good spirits sitting up in bed. He is thin and appears older than his stated age.  HENT:  Mouth/Throat: No oropharyngeal exudate.  Eyes: Conjunctivae are normal.  Neck: Neck supple.  Cardiovascular: Normal rate and regular rhythm.   No murmur heard. Pulmonary/Chest: Effort normal. He has no wheezes. He has no rales.  He is using nasal prong oxygen.  Abdominal: Soft. He exhibits no distension. There is no tenderness.  Musculoskeletal: Normal range of motion. He exhibits no edema or tenderness.  Neurological: He is alert and oriented to person, place, and time.  Skin: No rash noted.  Psychiatric: Mood and affect normal.    Lab  Results Lab Results  Component Value Date   WBC 26.7 (H) 06/04/2017   HGB 10.8 (L) 06/04/2017   HCT 33.5 (L) 06/04/2017   MCV 83.3 06/04/2017   PLT 237 06/04/2017    Lab Results  Component Value Date   CREATININE 0.98 06/04/2017   BUN 24 (H) 06/04/2017   NA 137 06/04/2017   K 3.2 (L) 06/04/2017   CL 99 (L) 06/04/2017   CO2 27 06/04/2017    Lab Results  Component Value Date   ALT 20 06/03/2017   AST 53 (H) 06/03/2017   ALKPHOS 103 06/03/2017   BILITOT 0.7 06/03/2017     Microbiology: Recent Results (from the past 240 hour(s))  Blood culture (routine x 2)     Status: None   Collection Time: 05/27/17  4:30 AM  Result Value Ref Range Status   Specimen Description BLOOD LEFT ARM  Final  Special Requests   Final    BOTTLES DRAWN AEROBIC AND ANAEROBIC Blood Culture adequate volume   Culture   Final    NO GROWTH 5 DAYS Performed at [facility], [place] [address], [place], [place] [address]    Report Status 06/01/2017 FINAL  Final  Blood culture (routine x 2)     Status: Abnormal   Collection Time: 05/27/17  4:34 AM  Result Value Ref Range Status   Specimen Description BLOOD RIGHT FOREARM  Final   Special Requests   Final    BOTTLES DRAWN AEROBIC AND ANAEROBIC Blood Culture adequate volume   Culture  Setup Time   Final    GRAM POSITIVE COCCI ANAEROBIC BOTTLE ONLY CRITICAL RESULT CALLED TO, READ BACK BY AND VERIFIED WITH: Damian Leavell [id number] MLM Performed at [facility], [place] [address], [place], [place] [address]    Culture STREPTOCOCCUS PNEUMONIAE (A)  Final   Report Status 05/30/2017 FINAL  Final   Organism ID, Bacteria STREPTOCOCCUS PNEUMONIAE  Final      Susceptibility   Streptococcus pneumoniae - MIC*    ERYTHROMYCIN >=8 RESISTANT Resistant     LEVOFLOXACIN 1 SENSITIVE Sensitive     PENICILLIN (meningitis) 0.12 RESISTANT Resistant     PENICILLIN (non-meningitis) 0.12 SENSITIVE Sensitive     CEFTRIAXONE (non-meningitis) 0.25 SENSITIVE  Sensitive     CEFTRIAXONE (meningitis) 0.25 SENSITIVE Sensitive     * STREPTOCOCCUS PNEUMONIAE  Blood Culture ID Panel (Reflexed)     Status: Abnormal   Collection Time: 05/27/17  4:34 AM  Result Value Ref Range Status   Enterococcus species NOT DETECTED NOT DETECTED Final   Vancomycin resistance NOT DETECTED NOT DETECTED Final   Listeria monocytogenes NOT DETECTED NOT DETECTED Final   Staphylococcus species NOT DETECTED NOT DETECTED Final   Staphylococcus aureus NOT DETECTED NOT DETECTED Final   Methicillin resistance NOT DETECTED NOT DETECTED Final   Streptococcus species DETECTED (A) NOT DETECTED Final    Comment: CRITICAL RESULT CALLED TO, READ BACK BY AND VERIFIED WITH: PHARMD J GADHIA [id number] MLM    Streptococcus agalactiae NOT DETECTED NOT DETECTED Final   Streptococcus pneumoniae DETECTED (A) NOT DETECTED Final    Comment: CRITICAL RESULT CALLED TO, READ BACK BY AND VERIFIED WITH: PHARMD J GADHIA [id number] 0815 MLM    Streptococcus pyogenes NOT DETECTED NOT DETECTED Final   Acinetobacter baumannii NOT DETECTED NOT DETECTED Final   Enterobacteriaceae species NOT DETECTED NOT DETECTED Final   Enterobacter cloacae complex NOT DETECTED NOT DETECTED Final   Escherichia coli NOT DETECTED NOT DETECTED Final   Klebsiella oxytoca NOT DETECTED NOT DETECTED Final   Klebsiella pneumoniae NOT DETECTED NOT DETECTED Final   Proteus species NOT DETECTED NOT DETECTED Final   Serratia marcescens NOT DETECTED NOT DETECTED Final   Carbapenem resistance NOT DETECTED NOT DETECTED Final   Haemophilus influenzae NOT DETECTED NOT DETECTED Final   Neisseria meningitidis NOT DETECTED NOT DETECTED Final   Pseudomonas aeruginosa NOT DETECTED NOT DETECTED Final   Candida albicans NOT DETECTED NOT DETECTED Final   Candida glabrata NOT DETECTED NOT DETECTED Final   Candida krusei NOT DETECTED NOT DETECTED Final   Candida parapsilosis NOT DETECTED NOT DETECTED Final   Candida tropicalis NOT DETECTED  NOT DETECTED Final    Comment: Performed at [facility], [place]. [address], [place], [place] [address]  MRSA PCR Screening     Status: None   Collection Time: 05/27/17  6:17 PM  Result  Value Ref Range Status   MRSA by PCR NEGATIVE NEGATIVE Final    Comment:        The GeneXpert MRSA Assay (FDA approved for NASAL specimens only), is one component of a comprehensive MRSA colonization surveillance program. It is not intended to diagnose MRSA infection nor to guide or monitor treatment for MRSA infections.   Respiratory Panel by PCR     Status: Abnormal   Collection Time: 05/27/17 10:55 PM  Result Value Ref Range Status   Adenovirus NOT DETECTED NOT DETECTED Final   Coronavirus 229E NOT DETECTED NOT DETECTED Final   Coronavirus HKU1 NOT DETECTED NOT DETECTED Final   Coronavirus NL63 NOT DETECTED NOT DETECTED Final   Coronavirus OC43 NOT DETECTED NOT DETECTED Final   Metapneumovirus NOT DETECTED NOT DETECTED Final   Rhinovirus / Enterovirus DETECTED (A) NOT DETECTED Final   Influenza A NOT DETECTED NOT DETECTED Final   Influenza A H1 NOT DETECTED NOT DETECTED Final   Influenza A H1 2009 NOT DETECTED NOT DETECTED Final   Influenza A H3 NOT DETECTED NOT DETECTED Final   Influenza B NOT DETECTED NOT DETECTED Final   Parainfluenza Virus 1 NOT DETECTED NOT DETECTED Final   Parainfluenza Virus 2 NOT DETECTED NOT DETECTED Final   Parainfluenza Virus 3 NOT DETECTED NOT DETECTED Final   Parainfluenza Virus 4 NOT DETECTED NOT DETECTED Final   Respiratory Syncytial Virus NOT DETECTED NOT DETECTED Final   Bordetella pertussis NOT DETECTED NOT DETECTED Final   Chlamydophila pneumoniae NOT DETECTED NOT DETECTED Final   Mycoplasma pneumoniae NOT DETECTED NOT DETECTED Final    Comment: Performed at [facility], [place]. [address], [place], [place] [address]  Culture, blood (Routine X 2) w Reflex to ID Panel     Status: None (Preliminary result)   Collection Time: 05/31/17  1:20 PM    Result Value Ref Range Status   Specimen Description BLOOD BLOOD RIGHT HAND  Final   Special Requests   Final    BOTTLES DRAWN AEROBIC ONLY Blood Culture adequate volume   Culture   Final    NO GROWTH 3 DAYS Performed at [facility], [place] [address], [place], [place] [address]    Report Status PENDING  Incomplete  Culture, blood (Routine X 2) w Reflex to ID Panel     Status: None (Preliminary result)   Collection Time: 05/31/17  1:26 PM  Result Value Ref Range Status   Specimen Description BLOOD BLOOD LEFT HAND  Final   Special Requests IN PEDIATRIC BOTTLE Blood Culture adequate volume  Final   Culture   Final    NO GROWTH 3 DAYS Performed at [facility], [place] [address], [place], [place] [address]    Report Status PENDING  Incomplete  Culture, expectorated sputum-assessment     Status: None   Collection Time: 06/01/17 11:12 AM  Result Value Ref Range Status   Specimen Description SPUTUM  Final   Special Requests NONE  Final   Sputum evaluation THIS SPECIMEN IS ACCEPTABLE FOR SPUTUM CULTURE  Final   Report Status 06/01/2017 FINAL  Final  Culture, respiratory (NON-Expectorated)     Status: None   Collection Time: 06/01/17 11:12 AM  Result Value Ref Range Status   Specimen Description SPUTUM  Final   Special Requests NONE Reflexed from [id number]  Final   Gram Stain   Final    NO WBC SEEN RARE SQUAMOUS EPITHELIAL CELLS PRESENT FEW GRAM POSITIVE COCCI IN CLUSTERS  Culture   Final    Consistent with normal respiratory flora. Performed at [facility], [place] [address], [place], [place] [address]    Report Status 06/03/2017 FINAL  Final    Michel Bickers, MD [facility] [place] [phone] pager   [phone] cell 06/04/2017, 4:12 PM

## 2017-06-04 NOTE — Progress Notes (Signed)
Rt took pt off BIPAP and placed back on heated high flow per pt request. Pt doing well at this time.

## 2017-06-04 NOTE — Progress Notes (Signed)
Rt placed pt on BIPAP due to low sats. 

## 2017-06-04 NOTE — Progress Notes (Signed)
[facility] Attending Note  ADMISSION DATE:05/27/2017  CONSULTATION DATE:05/27/2017  REFERRING MD:Dr. Patrecia Pour   CHIEF COMPLAINT:Acute Hypoxic Respiratory Failure  Presenting HPI:  66 y.o. male presenting with dyspnea, fever to 101.26F, chills, weakness, and acute hypoxic respiratory failure. Presumed to be secondary to community-acquired pneumonia versus acute exacerbation of underlying COPD. Patient has a history of essential hypertension and hepatitis C viral infection. Patient also has polysubstance abuse including cocaine.  Subjective:  Increasing oxygen requirement overnight. Still having left-sided chest pain. No fever or chills. Dyspnea improved on BiPAP. Patient reporting his cough is now producing some mucus.   Review of Systems:  Denies any fever or chills. No abdominal pain or nausea.  Temp:  [97.8 F (36.6 C)-98.2 F (36.8 C)] 98.1 F (36.7 C) (09/26 0400) Pulse Rate:  [74-108] 99 (09/26 0900) Resp:  [11-27] 20 (09/26 0900) BP: (106-161)/(23-66) 161/55 (09/26 0900) SpO2:  [73 %-99 %] 85 % (09/26 0900) FiO2 (%):  [80 %-100 %] 100 % (09/26 0738)  General:   No distress. No family at bedside. Awake. Integument:  Warm. Dry. No rash on exposed skin. HEENT:  No scleral icterus. No scleral injection. Pupils symmetric. Pulmonary: Good aeration bilaterally. Normal work of breathing on BiPAP. Cardiovascular:  Regular rate & regular rate. Regular rhythm. No edema. Abdomen:  Soft. Nontender. Normal bowel sounds. Neurological:  Grossly nonfocal. Cranial nerves intact. No meningismus.  CBC Latest Ref Rng & Units 06/03/2017 06/02/2017 06/01/2017  WBC 4.0 - 10.5 K/uL 30.3(H) 24.5(H) 18.1(H)  Hemoglobin 13.0 - 17.0 g/dL 11.3(L) 11.1(L) 9.8(L)  Hematocrit 39.0 - 52.0 % 34.4(L) 33.6(L) 29.1(L)  Platelets 150 - 400 K/uL 240 286 308   BMP Latest Ref Rng & Units 06/04/2017 06/03/2017 06/02/2017  Glucose 65 - 99 mg/dL - 124(H) 127(H)  BUN 6 - 20 mg/dL -  25(H) 26(H)  Creatinine 0.61 - 1.24 mg/dL 0.99 1.07 0.95  Sodium 135 - 145 mmol/L - 136 134(L)  Potassium 3.5 - 5.1 mmol/L - 3.3(L) 4.3  Chloride 101 - 111 mmol/L - 98(L) 100(L)  CO2 22 - 32 mmol/L - 26 23  Calcium 8.9 - 10.3 mg/dL - 7.9(L) 8.3(L)   IMAGING/STUDIES: V/Q SCAN 9/18:  Very low probability for pulmonary embolism (0 - 9%). UDS 9/18:  Positive Cocaine & Opiates  CT CHEST W/O 9/18:  Personally reviewed by me. No pleural effusion appreciated. No pericardial effusion. Precarinal mediastinal lymph node and large. Suspect subcarinal lymph node enlargement as well. Patchy bilateral alveolar opacities that are groundglass in the mid to upper lung zones and more consolidation within the dependent bilateral lower lobes. TTE 9/22:  Mild concentric LVH with EF 60-65%. Normal regional wall motion. Grade 1 diastolic dysfunction. LA & RA normal in size. RV normal in size and function. No aortic stenosis or regurgitation. Aortic root normal in size. Trivial mitral regurgitation without stenosis. No pulmonic stenosis or regurgitation. Mild tricuspid regurgitation. No pericardial effusion. PORT CXR 9/24:  Previously reviewed by me. Patchy bilateral alveolar opacities with some worsening in the right lower lung. Silhouetting of the right hemidiaphragm which is new compared with earlier chest x-ray imaging. PORT CXR 9/25:  Previously reviewed by me. No pleural effusion appreciated. Slight improvement in bilateral patchy alveolar opacities compared with x-ray imaging from 9/22.  MICROBIOLOGY: MRSA PCR 9/18:  Negative Respiratory Viral Panel PCR 9/18:  Rhinovirus Influenza A/B PCR 9/28:  Negative  Blood Cultures x2 9/18:  1/2 Positive Streptococcus pneumoniae  Blood Cultures x2 9/22 >>> Sputum Culture  9/23:  Normal Respiratory Flora   ANTIBIOTICS: Zosyn 9/18 - 9/19 Azithromycin 9/18 - 9/19 Rocephin 9/18 - 9/24 Cefepime 9/24 (x1 dose) Tressie Ellis 9/24 >>> Vancomycin 9/24 >>>  ASSESSMENT/PLAN:  66  y.o. male with acute hypoxic respiratory failure secondary to Streptococcus pneumonia. Increasing oxygen requirement is concerning. Attempting diuresis for further optimization of his lung injury and pulmonary function. High risk for intubation.  1. Acute hypoxic respiratory failure: Continuing noninvasive positive pressure ventilation for increased work of breathing. Starting aggressive Lasix diuresis with 40 mg every 6 hours until tomorrow. Continuous pulse oximetry monitoring. High risk for endotracheal intubation. 2. Sepsis: Continuing on broad-spectrum antibiotic with Fortaz and vancomycin. Awaiting finalization of blood cultures. Trending Procalcitonin per algorithm. 3. Streptococcus pneumoniae pneumonia/bacteremia: Currently on day #9 of total antibiotic therapy. 4. Pleuritic chest pain: Continuing scheduled ibuprofen. Switching from prednisone to Solu-Medrol 40 mg IV every 8 hour. Recommend continued monitoring of renal function closely as diuresis and NSAID use may result in renal injury. Continuing Protonix by mouth daily. 5. COPD: No signs of acute exacerbation. Continuing Duonebs every 6 hours. Prednisone transitioned to Solu-Medrol with pleuritic-type chest pain.   I have spent a total of 38 minutes of time today caring for the patient, reviewing the patient's electronic medical record, and with more than 50% of that time spent coordinating care with the patient as well as reviewing the continuing plan of care with the patient and his attending physician at bedside.  Remainder of care as per primary service.  Sonia Baller Ashok Cordia, M.D. [facility] Pager:  [phone] After 3pm or if no response, call [phone] 9:43 AM 06/04/17

## 2017-06-05 LAB — BASIC METABOLIC PANEL
ANION GAP: 14 (ref 5–15)
BUN: 30 mg/dL — ABNORMAL HIGH (ref 6–20)
CO2: 30 mmol/L (ref 22–32)
Calcium: 8.1 mg/dL — ABNORMAL LOW (ref 8.9–10.3)
Chloride: 93 mmol/L — ABNORMAL LOW (ref 101–111)
Creatinine, Ser: 1.12 mg/dL (ref 0.61–1.24)
GFR calc Af Amer: >60 mL/min (ref >60)
GFR calc non Af Amer: >60 mL/min (ref >60)
GLUCOSE: 138 mg/dL — AB (ref 65–99)
POTASSIUM: 3.6 mmol/L (ref 3.5–5.1)
Sodium: 137 mmol/L (ref 135–145)

## 2017-06-05 LAB — CULTURE, BLOOD (ROUTINE X 2)
Culture: NO GROWTH
Culture: NO GROWTH
SPECIAL REQUESTS: ADEQUATE
Special Requests: ADEQUATE

## 2017-06-05 LAB — CBC
HEMATOCRIT: 36.3 % — AB (ref 39.0–52.0)
Hemoglobin: 12 g/dL — ABNORMAL LOW (ref 13.0–17.0)
MCH: 27.6 pg (ref 26.0–34.0)
MCHC: 33.1 g/dL (ref 30.0–36.0)
MCV: 83.4 fL (ref 78.0–100.0)
Platelets: 237 10*3/uL (ref 150–400)
RBC: 4.35 MIL/uL (ref 4.22–5.81)
RDW: 14.1 % (ref 11.5–15.5)
WBC: 24.4 10*3/uL — AB (ref 4.0–10.5)

## 2017-06-05 LAB — MAGNESIUM: Magnesium: 2.1 mg/dL (ref 1.7–2.4)

## 2017-06-05 MED ORDER — POTASSIUM CHLORIDE CRYS ER 20 MEQ PO TBCR
20.0000 meq | EXTENDED_RELEASE_TABLET | ORAL | Status: AC
  Administered 2017-06-05 (×2): 20 meq via ORAL
  Filled 2017-06-05 (×2): qty 1

## 2017-06-05 MED ORDER — POTASSIUM CHLORIDE CRYS ER 20 MEQ PO TBCR
20.0000 meq | EXTENDED_RELEASE_TABLET | Freq: Once | ORAL | Status: AC
  Administered 2017-06-05: 20 meq via ORAL
  Filled 2017-06-05: qty 1

## 2017-06-05 MED ORDER — FUROSEMIDE 10 MG/ML IJ SOLN
40.0000 mg | Freq: Three times a day (TID) | INTRAMUSCULAR | Status: AC
  Administered 2017-06-05 (×2): 40 mg via INTRAVENOUS
  Filled 2017-06-05 (×2): qty 4

## 2017-06-05 NOTE — Progress Notes (Signed)
Patient ID: Travis Salinas, male   DOB: August 08, 1951, 66 y.o.   MRN: [id number]          [facility]  Date of Admission:  05/27/2017   Total days of antibiotics 11        Day 2 ceftriaxone (second round)         ASSESSMENT: He continues to improve slowly on therapy for bacteremic pneumococcal pneumonia. He needs 3 more days of therapy. I will continue IV ceftriaxone for now. He is very weak and debilitated and requiring supplemental oxygen. Hopefully he can start to work with physical therapy soon.  PLAN: 1. Continue ceftriaxone 2. Recommend physical therapy  Principal Problem:   Pneumococcal pneumonia ([place]) Active Problems:   Pneumococcal bacteremia   Acute respiratory failure ([facility])   HTN (hypertension)   Chronic hepatitis C without hepatic coma ([facility])   Substance abuse   IVDU (intravenous drug user)   Normocytic anemia   COPD (chronic obstructive pulmonary disease) ([place])   . ARIPiprazole  20 mg Oral Daily  . aspirin EC  81 mg Oral Daily  . atenolol  25 mg Oral BH-q7a  . chlorhexidine  15 mL Mouth Rinse BID  . clonazePAM  0.5 mg Oral BID  . dextromethorphan-guaiFENesin  1 tablet Oral BID  . enoxaparin (LOVENOX) injection  40 mg Subcutaneous Q24H  . feeding supplement (ENSURE ENLIVE)  237 mL Oral BID BM  . FLUoxetine  20 mg Oral Daily  . furosemide  40 mg Intravenous Q8H  . ibuprofen  600 mg Oral QID  . ipratropium-albuterol  3 mL Nebulization Q6H  . lamoTRIgine  200 mg Oral q morning - 10a  . mouth rinse  15 mL Mouth Rinse q12n4p  . methylPREDNISolone (SOLU-MEDROL) injection  40 mg Intravenous Q8H  . pantoprazole  40 mg Oral Daily  . tamsulosin  0.4 mg Oral Daily    SUBJECTIVE: He is feeling a little bit better each day. He is still coughing but has produced no sputum today. He has not been out of bed today. His appetite is improving.  Review of Systems: Review of Systems  Constitutional: Positive for malaise/fatigue. Negative for  chills, diaphoresis and fever.  Respiratory: Positive for cough and shortness of breath. Negative for hemoptysis and sputum production.   Cardiovascular: Negative for chest pain.  Gastrointestinal: Negative for abdominal pain, diarrhea, nausea and vomiting.    Allergies  Allergen Reactions  . Propoxyphene Nausea And Vomiting  . Amoxicillin Nausea And Vomiting  . Ciprofloxacin Nausea Only  . Depakote [Divalproex Sodium] Other (See Comments)    hallucinations    OBJECTIVE: Vitals:   06/05/17 1356 06/05/17 1357 06/05/17 1400 06/05/17 1600  BP:  (!) 129/56 (!) 124/51 (!) 100/26  Pulse:   72 74  Resp:   13 18  Temp:      TempSrc:      SpO2: 94%  100% 95%  Weight:      Height:       Body mass index is 18.84 kg/m.  Physical Exam  Constitutional: He is oriented to person, place, and time.  He is talkative and in good spirits. His wife is at the bedside.  Cardiovascular: Normal rate and regular rhythm.   No murmur heard. Pulmonary/Chest: Effort normal and breath sounds normal. He has no wheezes. He has no rales.  He remains on high flow nasal prong oxygen.  Abdominal: Soft. There is no tenderness.  Neurological: He is alert and oriented to  person, place, and time.  Skin: No rash noted.  Psychiatric: Mood and affect normal.    Lab Results Lab Results  Component Value Date   WBC 24.4 (H) 06/05/2017   HGB 12.0 (L) 06/05/2017   HCT 36.3 (L) 06/05/2017   MCV 83.4 06/05/2017   PLT 237 06/05/2017    Lab Results  Component Value Date   CREATININE 1.12 06/05/2017   BUN 30 (H) 06/05/2017   NA 137 06/05/2017   K 3.6 06/05/2017   CL 93 (L) 06/05/2017   CO2 30 06/05/2017    Lab Results  Component Value Date   ALT 20 06/03/2017   AST 53 (H) 06/03/2017   ALKPHOS 103 06/03/2017   BILITOT 0.7 06/03/2017     Microbiology: Recent Results (from the past 240 hour(s))  Blood culture (routine x 2)     Status: None   Collection Time: 05/27/17  4:30 AM  Result Value Ref Range  Status   Specimen Description BLOOD LEFT ARM  Final   Special Requests   Final    BOTTLES DRAWN AEROBIC AND ANAEROBIC Blood Culture adequate volume   Culture   Final    NO GROWTH 5 DAYS Performed at [facility], [place] [address], [place], [place] [address]    Report Status 06/01/2017 FINAL  Final  Blood culture (routine x 2)     Status: Abnormal   Collection Time: 05/27/17  4:34 AM  Result Value Ref Range Status   Specimen Description BLOOD RIGHT FOREARM  Final   Special Requests   Final    BOTTLES DRAWN AEROBIC AND ANAEROBIC Blood Culture adequate volume   Culture  Setup Time   Final    GRAM POSITIVE COCCI ANAEROBIC BOTTLE ONLY CRITICAL RESULT CALLED TO, READ BACK BY AND VERIFIED WITH: Damian Leavell [id number] MLM Performed at [facility], [place] [address] [place], [place] [address]    Culture STREPTOCOCCUS PNEUMONIAE (A)  Final   Report Status 05/30/2017 FINAL  Final   Organism ID, Bacteria STREPTOCOCCUS PNEUMONIAE  Final      Susceptibility   Streptococcus pneumoniae - MIC*    ERYTHROMYCIN >=8 RESISTANT Resistant     LEVOFLOXACIN 1 SENSITIVE Sensitive     PENICILLIN (meningitis) 0.12 RESISTANT Resistant     PENICILLIN (non-meningitis) 0.12 SENSITIVE Sensitive     CEFTRIAXONE (non-meningitis) 0.25 SENSITIVE Sensitive     CEFTRIAXONE (meningitis) 0.25 SENSITIVE Sensitive     * STREPTOCOCCUS PNEUMONIAE  Blood Culture ID Panel (Reflexed)     Status: Abnormal   Collection Time: 05/27/17  4:34 AM  Result Value Ref Range Status   Enterococcus species NOT DETECTED NOT DETECTED Final   Vancomycin resistance NOT DETECTED NOT DETECTED Final   Listeria monocytogenes NOT DETECTED NOT DETECTED Final   Staphylococcus species NOT DETECTED NOT DETECTED Final   Staphylococcus aureus NOT DETECTED NOT DETECTED Final   Methicillin resistance NOT DETECTED NOT DETECTED Final   Streptococcus species DETECTED (A) NOT DETECTED Final    Comment: CRITICAL RESULT CALLED TO, READ BACK  BY AND VERIFIED WITH: PHARMD J GADHIA [id number] MLM    Streptococcus agalactiae NOT DETECTED NOT DETECTED Final   Streptococcus pneumoniae DETECTED (A) NOT DETECTED Final    Comment: CRITICAL RESULT CALLED TO, READ BACK BY AND VERIFIED WITH: PHARMD J GADHIA [id number] MLM    Streptococcus pyogenes NOT DETECTED NOT DETECTED Final   Acinetobacter baumannii NOT DETECTED NOT DETECTED Final   Enterobacteriaceae species NOT DETECTED NOT DETECTED  Final   Enterobacter cloacae complex NOT DETECTED NOT DETECTED Final   Escherichia coli NOT DETECTED NOT DETECTED Final   Klebsiella oxytoca NOT DETECTED NOT DETECTED Final   Klebsiella pneumoniae NOT DETECTED NOT DETECTED Final   Proteus species NOT DETECTED NOT DETECTED Final   Serratia marcescens NOT DETECTED NOT DETECTED Final   Carbapenem resistance NOT DETECTED NOT DETECTED Final   Haemophilus influenzae NOT DETECTED NOT DETECTED Final   Neisseria meningitidis NOT DETECTED NOT DETECTED Final   Pseudomonas aeruginosa NOT DETECTED NOT DETECTED Final   Candida albicans NOT DETECTED NOT DETECTED Final   Candida glabrata NOT DETECTED NOT DETECTED Final   Candida krusei NOT DETECTED NOT DETECTED Final   Candida parapsilosis NOT DETECTED NOT DETECTED Final   Candida tropicalis NOT DETECTED NOT DETECTED Final    Comment: Performed at [facility], [place] [address], [place], [place] [address]  MRSA PCR Screening     Status: None   Collection Time: 05/27/17  6:17 PM  Result Value Ref Range Status   MRSA by PCR NEGATIVE NEGATIVE Final    Comment:        The GeneXpert MRSA Assay (FDA approved for NASAL specimens only), is one component of a comprehensive MRSA colonization surveillance program. It is not intended to diagnose MRSA infection nor to guide or monitor treatment for MRSA infections.   Respiratory Panel by PCR     Status: Abnormal   Collection Time: 05/27/17 10:55 PM  Result Value Ref Range Status   Adenovirus NOT DETECTED NOT  DETECTED Final   Coronavirus 229E NOT DETECTED NOT DETECTED Final   Coronavirus HKU1 NOT DETECTED NOT DETECTED Final   Coronavirus NL63 NOT DETECTED NOT DETECTED Final   Coronavirus OC43 NOT DETECTED NOT DETECTED Final   Metapneumovirus NOT DETECTED NOT DETECTED Final   Rhinovirus / Enterovirus DETECTED (A) NOT DETECTED Final   Influenza A NOT DETECTED NOT DETECTED Final   Influenza A H1 NOT DETECTED NOT DETECTED Final   Influenza A H1 2009 NOT DETECTED NOT DETECTED Final   Influenza A H3 NOT DETECTED NOT DETECTED Final   Influenza B NOT DETECTED NOT DETECTED Final   Parainfluenza Virus 1 NOT DETECTED NOT DETECTED Final   Parainfluenza Virus 2 NOT DETECTED NOT DETECTED Final   Parainfluenza Virus 3 NOT DETECTED NOT DETECTED Final   Parainfluenza Virus 4 NOT DETECTED NOT DETECTED Final   Respiratory Syncytial Virus NOT DETECTED NOT DETECTED Final   Bordetella pertussis NOT DETECTED NOT DETECTED Final   Chlamydophila pneumoniae NOT DETECTED NOT DETECTED Final   Mycoplasma pneumoniae NOT DETECTED NOT DETECTED Final    Comment: Performed at [facility], [place]. [address] [place], [place] [address]  Culture, blood (Routine X 2) w Reflex to ID Panel     Status: None   Collection Time: 05/31/17  1:20 PM  Result Value Ref Range Status   Specimen Description BLOOD BLOOD RIGHT HAND  Final   Special Requests   Final    BOTTLES DRAWN AEROBIC ONLY Blood Culture adequate volume   Culture   Final    NO GROWTH 5 DAYS Performed at [facility], [place] [address], [place], [place] [address]    Report Status 06/05/2017 FINAL  Final  Culture, blood (Routine X 2) w Reflex to ID Panel     Status: None   Collection Time: 05/31/17  1:26 PM  Result Value Ref Range Status   Specimen Description BLOOD BLOOD LEFT HAND  Final  Special Requests IN PEDIATRIC BOTTLE Blood Culture adequate volume  Final   Culture   Final    NO GROWTH 5 DAYS Performed at [facility], [place] [address]  [place], [place] [address]    Report Status 06/05/2017 FINAL  Final  Culture, expectorated sputum-assessment     Status: None   Collection Time: 06/01/17 11:12 AM  Result Value Ref Range Status   Specimen Description SPUTUM  Final   Special Requests NONE  Final   Sputum evaluation THIS SPECIMEN IS ACCEPTABLE FOR SPUTUM CULTURE  Final   Report Status 06/01/2017 FINAL  Final  Culture, respiratory (NON-Expectorated)     Status: None   Collection Time: 06/01/17 11:12 AM  Result Value Ref Range Status   Specimen Description SPUTUM  Final   Special Requests NONE Reflexed from [id number]  Final   Gram Stain   Final    NO WBC SEEN RARE SQUAMOUS EPITHELIAL CELLS PRESENT FEW GRAM POSITIVE COCCI IN CLUSTERS    Culture   Final    Consistent with normal respiratory flora. Performed at [facility], [place] [address], [place], [place] [address]    Report Status 06/03/2017 FINAL  Final    Michel Bickers, MD [facility] [place] [phone] pager   [phone] cell 06/05/2017, 5:02 PM

## 2017-06-05 NOTE — Progress Notes (Signed)
PROGRESS NOTE    Travis Salinas  MEQ:[id number] DOB: 06-Oct-1950 DOA: 05/27/2017 PCP: Kathyrn Lass, MD   Brief Narrative: 66 y.o.malewith medical history significant of hypertension, COPD, depression/anxiety and BPH who presented to the emergency department complaining of progressive worsening shortness of breath for the past 4 days. Patient was seen by his PCP 4 days ago and was diagnosed with COPD exacerbation. She was placed on doxycycline, prednisone and cough syrup. The morning of admission breathing got acutely worse and he developed fevers and chills. CT scan showed multifocal pneumonia. Given profound hypoxia he was admitted to stepdown and PCCM was consulted. He initially had some improvement however over the last few days he became febrile again, and more hypoxic. Still requiring BiPAP   Assessment & Plan:   Principal Problem:   Pneumococcal pneumonia ([place]) Active Problems:   HTN (hypertension)   Chronic hepatitis C without hepatic coma (HCC)   Substance abuse   IVDU (intravenous drug user)   Pneumococcal bacteremia   Acute respiratory failure (HCC)   Normocytic anemia   COPD (chronic obstructive pulmonary disease) (HCC)   Sepsis in the setting of multifocal pneumonia due to Streptococcus pneumoniae -Blood cultures with gram-positive cocci, Streptococcus pneumoniae 1 out of 2 bottles. No routine 2D echo is required for Streptococcus pneumonia bacteremia, discussed with ID on 9/20.  -Respiratory virus panel sent, came back positive with rhinovirus. Supportive treatment -Patient was febrile on admission, fever has resolved for a few days however starting 9/22 he started spiking again. He also has increased leukocytosis which continues to go up. Blood cultures were sent on 9/22; no growth to date.  -appreciate critical care consultation, patient's antibiotics were broadened to vancomycin and cefepime on 9/24 -Sputum cultures consistent with normal respiratory  flora. -Appreciate ID evaluation, antibiotics narrow to ceftriaxone. Needs 14 days of treatment.   Acute hypoxic respiratory failure, developing ARDS -Due to #1 and #2 -Received IV Lasix on 9-24, chest x-ray 9-25 still with ARDS appearance however slightly improved -More hypoxic overnight. Was off oxygen. Now sat in the highs 87 % on 40  % HFNS.  -BNP at 67.  received IV lasix 40 mg time 3 doses. 2.9 L of urine out-put.  Requiring less amount of oxygen today   Streptococcus Pneumoniae  Bacteremia;  On IV antibiotics.  Repeated blood culture 9-22 no growth to date.   COPD exacerbation -In the setting of #1, no further wheezing, continue duo nebs, change Solu-Medrol to prednisone -Critical care followed patient, signed off on 9/21, re-consulted 9/22 due to worsening respiratory status.   AKI on CKD 2 -Acute kidney injury resolved, creatinine is back to his baseline -monitor cr on lasix.   HTN -continue with atenolol. Hold Norvasc in event of worsening infection.   Depression/anxiety -Continue home medications  BPH -Continue home medications  Polysubstance abuse -UDS positive for cocaine.  SVT; check mg. Replete k. Continue with atenolol.    DVT prophylaxis: Lovenox.  Code Status: Full code.  Family Communication: care discussed with patient.  Disposition Plan: Remain in the step down.    Consultants:   CCM   Procedures:   none   Antimicrobials:   Vancomycin 9-24  Fortaz. 9-24   Subjective: He is feeling better, no worsening dyspnea today    Objective: Vitals:   06/05/17 0400 06/05/17 0600 06/05/17 0700 06/05/17 0724  BP: (!) 144/67 (!) 131/57 (!) 112/55   Pulse: 79 87 81   Resp: 12 15 19    Temp:  TempSrc:      SpO2: 99% 100% 100% 100%  Weight:      Height:        Intake/Output Summary (Last 24 hours) at 06/05/17 0733 Last data filed at 06/05/17 0700  Gross per 24 hour  Intake          1183.33 ml  Output             2900 ml    Net         -1716.67 ml   Filed Weights   05/30/17 0345 05/31/17 0404 06/01/17 0339  Weight: 70.1 kg (154 lb 8.7 oz) 69.3 kg (152 lb 12.5 oz) 63 kg (138 lb 14.2 oz)    Examination:  General exam; NAD Respiratory system:  No wheezing, bilateral crackles,  Cardiovascular system: S 1, S 2 RRR Gastrointestinal system: BS present, soft, nt, nd Central nervous system: Alert and oriented. No focal neurological deficits. Extremities: Symmetric 5 x 5 power. Skin: no rash    Data Reviewed: I have personally reviewed following labs and imaging studies  CBC:  Recent Labs Lab 06/01/17 0309 06/02/17 0306 06/03/17 0710 06/04/17 0933 06/05/17 0313  WBC 18.1* 24.5* 30.3* 26.7* 24.4*  HGB 9.8* 11.1* 11.3* 10.8* 12.0*  HCT 29.1* 33.6* 34.4* 33.5* 36.3*  MCV 82.0 82.0 82.7 83.3 83.4  PLT 308 286 240 237 790   Basic Metabolic Panel:  Recent Labs Lab 06/01/17 0309 06/02/17 0306 06/03/17 0710 06/04/17 0319 06/04/17 0933 06/05/17 0313  NA 137 134* 136  --  137 137  K 3.7 4.3 3.3*  --  3.2* 3.6  CL 100* 100* 98*  --  99* 93*  CO2 29 23 26   --  27 30  GLUCOSE 92 127* 124*  --  119* 138*  BUN 25* 26* 25*  --  24* 30*  CREATININE 0.88 0.95 1.07 0.99 0.98 1.12  CALCIUM 8.1* 8.3* 7.9*  --  7.9* 8.1*  MG 2.3  --  1.9  --   --   --    GFR: Estimated Creatinine Clearance: 58.6 mL/min (by C-G formula based on SCr of 1.12 mg/dL). Liver Function Tests:  Recent Labs Lab 05/31/17 0306 06/03/17 0710  AST 47* 53*  ALT 18 20  ALKPHOS 66 103  BILITOT 0.6 0.7  PROT 5.6* 6.4*  ALBUMIN 2.5* 2.4*   No results for input(s): LIPASE, AMYLASE in the last 168 hours. No results for input(s): AMMONIA in the last 168 hours. Coagulation Profile: No results for input(s): INR, PROTIME in the last 168 hours. Cardiac Enzymes: No results for input(s): CKTOTAL, CKMB, CKMBINDEX, TROPONINI in the last 168 hours. BNP (last 3 results) No results for input(s): PROBNP in the last 8760 hours. HbA1C: No  results for input(s): HGBA1C in the last 72 hours. CBG: No results for input(s): GLUCAP in the last 168 hours. Lipid Profile: No results for input(s): CHOL, HDL, LDLCALC, TRIG, CHOLHDL, LDLDIRECT in the last 72 hours. Thyroid Function Tests: No results for input(s): TSH, T4TOTAL, FREET4, T3FREE, THYROIDAB in the last 72 hours. Anemia Panel: No results for input(s): VITAMINB12, FOLATE, FERRITIN, TIBC, IRON, RETICCTPCT in the last 72 hours. Sepsis Labs:  Recent Labs Lab 05/31/17 0306 06/02/17 0306 06/03/17 0710 06/04/17 0319  PROCALCITON 0.75 0.43 0.48 0.38    Recent Results (from the past 240 hour(s))  Blood culture (routine x 2)     Status: None   Collection Time: 05/27/17  4:30 AM  Result Value Ref Range Status   Specimen Description  BLOOD LEFT ARM  Final   Special Requests   Final    BOTTLES DRAWN AEROBIC AND ANAEROBIC Blood Culture adequate volume   Culture   Final    NO GROWTH 5 DAYS Performed at [facility], [address] [place], [place] [address]    Report Status 06/01/2017 FINAL  Final  Blood culture (routine x 2)     Status: Abnormal   Collection Time: 05/27/17  4:34 AM  Result Value Ref Range Status   Specimen Description BLOOD RIGHT FOREARM  Final   Special Requests   Final    BOTTLES DRAWN AEROBIC AND ANAEROBIC Blood Culture adequate volume   Culture  Setup Time   Final    GRAM POSITIVE COCCI ANAEROBIC BOTTLE ONLY CRITICAL RESULT CALLED TO, READ BACK BY AND VERIFIED WITH: Damian Leavell [id number] MLM Performed at [facility], [place] [address] [place], [place] [address]    Culture STREPTOCOCCUS PNEUMONIAE (A)  Final   Report Status 05/30/2017 FINAL  Final   Organism ID, Bacteria STREPTOCOCCUS PNEUMONIAE  Final      Susceptibility   Streptococcus pneumoniae - MIC*    ERYTHROMYCIN >=8 RESISTANT Resistant     LEVOFLOXACIN 1 SENSITIVE Sensitive     PENICILLIN (meningitis) 0.12 RESISTANT Resistant     PENICILLIN (non-meningitis) 0.12  SENSITIVE Sensitive     CEFTRIAXONE (non-meningitis) 0.25 SENSITIVE Sensitive     CEFTRIAXONE (meningitis) 0.25 SENSITIVE Sensitive     * STREPTOCOCCUS PNEUMONIAE  Blood Culture ID Panel (Reflexed)     Status: Abnormal   Collection Time: 05/27/17  4:34 AM  Result Value Ref Range Status   Enterococcus species NOT DETECTED NOT DETECTED Final   Vancomycin resistance NOT DETECTED NOT DETECTED Final   Listeria monocytogenes NOT DETECTED NOT DETECTED Final   Staphylococcus species NOT DETECTED NOT DETECTED Final   Staphylococcus aureus NOT DETECTED NOT DETECTED Final   Methicillin resistance NOT DETECTED NOT DETECTED Final   Streptococcus species DETECTED (A) NOT DETECTED Final    Comment: CRITICAL RESULT CALLED TO, READ BACK BY AND VERIFIED WITH: PHARMD J GADHIA [id number] MLM    Streptococcus agalactiae NOT DETECTED NOT DETECTED Final   Streptococcus pneumoniae DETECTED (A) NOT DETECTED Final    Comment: CRITICAL RESULT CALLED TO, READ BACK BY AND VERIFIED WITH: PHARMD J GADHIA [id number] MLM    Streptococcus pyogenes NOT DETECTED NOT DETECTED Final   Acinetobacter baumannii NOT DETECTED NOT DETECTED Final   Enterobacteriaceae species NOT DETECTED NOT DETECTED Final   Enterobacter cloacae complex NOT DETECTED NOT DETECTED Final   Escherichia coli NOT DETECTED NOT DETECTED Final   Klebsiella oxytoca NOT DETECTED NOT DETECTED Final   Klebsiella pneumoniae NOT DETECTED NOT DETECTED Final   Proteus species NOT DETECTED NOT DETECTED Final   Serratia marcescens NOT DETECTED NOT DETECTED Final   Carbapenem resistance NOT DETECTED NOT DETECTED Final   Haemophilus influenzae NOT DETECTED NOT DETECTED Final   Neisseria meningitidis NOT DETECTED NOT DETECTED Final   Pseudomonas aeruginosa NOT DETECTED NOT DETECTED Final   Candida albicans NOT DETECTED NOT DETECTED Final   Candida glabrata NOT DETECTED NOT DETECTED Final   Candida krusei NOT DETECTED NOT DETECTED Final   Candida parapsilosis  NOT DETECTED NOT DETECTED Final   Candida tropicalis NOT DETECTED NOT DETECTED Final    Comment: Performed at [facility], [place]. [address] [place], [place] [address]  MRSA PCR Screening     Status: None   Collection  Time: 05/27/17  6:17 PM  Result Value Ref Range Status   MRSA by PCR NEGATIVE NEGATIVE Final    Comment:        The GeneXpert MRSA Assay (FDA approved for NASAL specimens only), is one component of a comprehensive MRSA colonization surveillance program. It is not intended to diagnose MRSA infection nor to guide or monitor treatment for MRSA infections.   Respiratory Panel by PCR     Status: Abnormal   Collection Time: 05/27/17 10:55 PM  Result Value Ref Range Status   Adenovirus NOT DETECTED NOT DETECTED Final   Coronavirus 229E NOT DETECTED NOT DETECTED Final   Coronavirus HKU1 NOT DETECTED NOT DETECTED Final   Coronavirus NL63 NOT DETECTED NOT DETECTED Final   Coronavirus OC43 NOT DETECTED NOT DETECTED Final   Metapneumovirus NOT DETECTED NOT DETECTED Final   Rhinovirus / Enterovirus DETECTED (A) NOT DETECTED Final   Influenza A NOT DETECTED NOT DETECTED Final   Influenza A H1 NOT DETECTED NOT DETECTED Final   Influenza A H1 2009 NOT DETECTED NOT DETECTED Final   Influenza A H3 NOT DETECTED NOT DETECTED Final   Influenza B NOT DETECTED NOT DETECTED Final   Parainfluenza Virus 1 NOT DETECTED NOT DETECTED Final   Parainfluenza Virus 2 NOT DETECTED NOT DETECTED Final   Parainfluenza Virus 3 NOT DETECTED NOT DETECTED Final   Parainfluenza Virus 4 NOT DETECTED NOT DETECTED Final   Respiratory Syncytial Virus NOT DETECTED NOT DETECTED Final   Bordetella pertussis NOT DETECTED NOT DETECTED Final   Chlamydophila pneumoniae NOT DETECTED NOT DETECTED Final   Mycoplasma pneumoniae NOT DETECTED NOT DETECTED Final    Comment: Performed at [facility], [place]. [address], [place], [place] [address]  Culture, blood (Routine X 2) w Reflex to ID Panel     Status: None  (Preliminary result)   Collection Time: 05/31/17  1:20 PM  Result Value Ref Range Status   Specimen Description BLOOD BLOOD RIGHT HAND  Final   Special Requests   Final    BOTTLES DRAWN AEROBIC ONLY Blood Culture adequate volume   Culture   Final    NO GROWTH 4 DAYS Performed at [facility], [place] [address], [place], [place] [address]    Report Status PENDING  Incomplete  Culture, blood (Routine X 2) w Reflex to ID Panel     Status: None (Preliminary result)   Collection Time: 05/31/17  1:26 PM  Result Value Ref Range Status   Specimen Description BLOOD BLOOD LEFT HAND  Final   Special Requests IN PEDIATRIC BOTTLE Blood Culture adequate volume  Final   Culture   Final    NO GROWTH 4 DAYS Performed at [facility], [place] [address], [place], [place] [address]    Report Status PENDING  Incomplete  Culture, expectorated sputum-assessment     Status: None   Collection Time: 06/01/17 11:12 AM  Result Value Ref Range Status   Specimen Description SPUTUM  Final   Special Requests NONE  Final   Sputum evaluation THIS SPECIMEN IS ACCEPTABLE FOR SPUTUM CULTURE  Final   Report Status 06/01/2017 FINAL  Final  Culture, respiratory (NON-Expectorated)     Status: None   Collection Time: 06/01/17 11:12 AM  Result Value Ref Range Status   Specimen Description SPUTUM  Final   Special Requests NONE Reflexed from [id number]  Final   Gram Stain   Final    NO WBC SEEN RARE SQUAMOUS EPITHELIAL CELLS PRESENT FEW  GRAM POSITIVE COCCI IN CLUSTERS    Culture   Final    Consistent with normal respiratory flora. Performed at [facility], [place] [address], [place], [place] [address]    Report Status 06/03/2017 FINAL  Final         Radiology Studies: Dg Chest Port 1 View  Result Date: 06/04/2017 CLINICAL DATA:  Hypoxemia. EXAM: PORTABLE CHEST 1 VIEW COMPARISON:  Yesterday FINDINGS: History of pneumonia and fever. There is diffuse interstitial and airspace opacity with mild asymmetric  predominance on the right. No effusion or pneumothorax. Normal heart size and mediastinal contours. IMPRESSION: Stable widespread interstitial and airspace opacity history suggesting infection and/or ARDS. Pulmonary edema could contribute. Electronically Signed   By: Monte Fantasia M.D.   On: 06/04/2017 13:43        Scheduled Meds: . ARIPiprazole  20 mg Oral Daily  . aspirin EC  81 mg Oral Daily  . atenolol  25 mg Oral BH-q7a  . chlorhexidine  15 mL Mouth Rinse BID  . clonazePAM  0.5 mg Oral BID  . dextromethorphan-guaiFENesin  1 tablet Oral BID  . enoxaparin (LOVENOX) injection  40 mg Subcutaneous Q24H  . feeding supplement (ENSURE ENLIVE)  237 mL Oral BID BM  . FLUoxetine  20 mg Oral Daily  . ibuprofen  600 mg Oral QID  . ipratropium-albuterol  3 mL Nebulization Q6H  . lamoTRIgine  200 mg Oral q morning - 10a  . mouth rinse  15 mL Mouth Rinse q12n4p  . methylPREDNISolone (SOLU-MEDROL) injection  40 mg Intravenous Q8H  . pantoprazole  40 mg Oral Daily  . potassium chloride  20 mEq Oral Once  . tamsulosin  0.4 mg Oral Daily   Continuous Infusions: . sodium chloride 10 mL/hr at 06/04/17 1220  . cefTRIAXone (ROCEPHIN)  IV Stopped (06/04/17 2258)     LOS: 9 days    Time spent: 35 minutes.     Elmarie Shiley, MD [facility] Pager [phone]  If 7PM-7AM, please contact night-coverage [email or web address] Password [facility] 06/05/2017, 7:33 AM

## 2017-06-05 NOTE — Consult Note (Signed)
   [facility] Inpatient Consult   06/05/2017  Elmore Hyslop 1951-03-25 [id number]    [facility] Care Management/ Link to Wellness follow up on behalf of [employer] employees/dependents with [facility] insurance.   Chart reviewed. Mr. Fiore Detjen remains in stepdown unit on high flow 02. Will not attempt to engage at this time to discuss Link to Wellness program.   Will follow up later at more appropriate time.    [name], MSN-Ed, RN,BSN [facility] Liaison [phone]

## 2017-06-05 NOTE — Care Management Note (Signed)
Case Management Note  Patient Details  Name: Travis Salinas MRN: [id number] Date of Birth: April 15, 1951  Subjective/Objective:                  pna  Action/Plan: Date:  June 05, 2017 Chart reviewed for concurrent status and case management needs.  Will continue to follow patient progress.  Discharge Planning: following for needs  Expected discharge date: [id number]  Velva Harman, BSN, [place], [place]   Expected Discharge Date:   (unknown)               Expected Discharge Plan:  Home/Self Care  In-House Referral:     Discharge planning Services  CM Consult  Post Acute Care Choice:    Choice offered to:     DME Arranged:    DME Agency:     HH Arranged:    [place] Agency:     Status of Service:  In process, will continue to follow  If discussed at Long Length of Stay Meetings, dates discussed:    Additional Comments:  Leeroy Cha, RN 06/05/2017, 8:32 AM

## 2017-06-05 NOTE — Progress Notes (Signed)
[facility] Attending Note  ADMISSION DATE:05/27/2017  CONSULTATION DATE:05/27/2017  REFERRING MD:Dr. Patrecia Pour   CHIEF COMPLAINT:Acute Hypoxic Respiratory Failure  Presenting HPI:  66 y.o. male presenting with dyspnea, fever to 101.32F, chills, weakness, and acute hypoxic respiratory failure. Presumed to be secondary to community-acquired pneumonia versus acute exacerbation of underlying COPD. Patient has a history of essential hypertension and hepatitis C viral infection. Patient also has polysubstance abuse including cocaine.  Subjective:   Feels better   Review of Systems:  Denies CP, sob better. Now cough is productive   Temp:  [97.4 F (36.3 C)-98.6 F (37 C)] 97.4 F (36.3 C) (09/27 0334) Pulse Rate:  [72-98] 78 (09/27 1000) Resp:  [10-28] 12 (09/27 1000) BP: (96-166)/(41-92) 126/57 (09/27 0800) SpO2:  [81 %-100 %] 99 % (09/27 1000) FiO2 (%):  [80 %-100 %] 80 % (09/27 0829)  Intake/Output Summary (Last 24 hours) at 06/05/17 1032 Last data filed at 06/05/17 0900  Gross per 24 hour  Intake          1023.33 ml  Output             2425 ml  Net         -1401.67 ml   General:  Sitting up in bed. No distress. In better spirits.  Integument:  Moist, no ulcerations.  HEENT:  NCAT, no JVD  Pulmonary: good air movement. No accessory use crackles noted posteriorly. Notes marked reduction in pleuritic pain w/ deep breath  Cardiovascular:  RRR w/out MRG Abdomen:  Soft, not tender  Neurological:  No focal def  CBC Latest Ref Rng & Units 06/05/2017 06/04/2017 06/03/2017  WBC 4.0 - 10.5 K/uL 24.4(H) 26.7(H) 30.3(H)  Hemoglobin 13.0 - 17.0 g/dL 12.0(L) 10.8(L) 11.3(L)  Hematocrit 39.0 - 52.0 % 36.3(L) 33.5(L) 34.4(L)  Platelets 150 - 400 K/uL 237 237 240   BMP Latest Ref Rng & Units 06/05/2017 06/04/2017 06/04/2017  Glucose 65 - 99 mg/dL 138(H) 119(H) -  BUN 6 - 20 mg/dL 30(H) 24(H) -  Creatinine 0.61 - 1.24 mg/dL 1.12 0.98 0.99  Sodium 135 - 145  mmol/L 137 137 -  Potassium 3.5 - 5.1 mmol/L 3.6 3.2(L) -  Chloride 101 - 111 mmol/L 93(L) 99(L) -  CO2 22 - 32 mmol/L 30 27 -  Calcium 8.9 - 10.3 mg/dL 8.1(L) 7.9(L) -   Recent Labs Lab 05/31/17 0306  06/02/17 0306 06/03/17 0710 06/04/17 0319 06/04/17 0933 06/05/17 0313  PROCALCITON 0.75  --  0.43 0.48 0.38  --   --   WBC 15.3*  < > 24.5* 30.3*  --  26.7* 24.4*  < > = values in this interval not displayed.    IMAGING/STUDIES: V/Q SCAN 9/18:  Very low probability for pulmonary embolism (0 - 9%). UDS 9/18:  Positive Cocaine & Opiates  CT CHEST W/O 9/18:  Personally reviewed by me. No pleural effusion appreciated. No pericardial effusion. Precarinal mediastinal lymph node and large. Suspect subcarinal lymph node enlargement as well. Patchy bilateral alveolar opacities that are groundglass in the mid to upper lung zones and more consolidation within the dependent bilateral lower lobes. TTE 9/22:  Mild concentric LVH with EF 60-65%. Normal regional wall motion. Grade 1 diastolic dysfunction. LA & RA normal in size. RV normal in size and function. No aortic stenosis or regurgitation. Aortic root normal in size. Trivial mitral regurgitation without stenosis. No pulmonic stenosis or regurgitation. Mild tricuspid regurgitation. No pericardial effusion. PORT CXR 9/24:  Previously reviewed by me.  Patchy bilateral alveolar opacities with some worsening in the right lower lung. Silhouetting of the right hemidiaphragm which is new compared with earlier chest x-ray imaging. PORT CXR 9/25:  Previously reviewed by me. No pleural effusion appreciated. Slight improvement in bilateral patchy alveolar opacities compared with x-ray imaging from 9/22.  MICROBIOLOGY: MRSA PCR 9/18:  Negative Respiratory Viral Panel PCR 9/18:  Rhinovirus Influenza A/B PCR 9/28:  Negative  Blood Cultures x2 9/18:  1/2 Positive Streptococcus pneumoniae  Blood Cultures x2 9/22 >>> Sputum Culture 9/23:  Normal Respiratory  Flora   ANTIBIOTICS: Zosyn 9/18 - 9/19 Azithromycin 9/18 - 9/19 Rocephin 9/18 - 9/24 Cefepime 9/24 (x1 dose) Tressie Ellis 9/24 >>>9/26 Vancomycin 9/24 >>>9/26 Rocephin 9/26>>  ASSESSMENT/PLAN:    Acute hypoxic respiratory failure Pneumococcal PNA  ARDS COPD w/out exacerbation Pleuritic chest pain Strep bacteremia  Sepsis Deconditioning  Chronic pain Leukocytosis   Acute hypoxic respiratory failure: in the setting of pneumococcal pneumonia w/ bacteremia w/ resulting ARDS. Complicated by pleuritic type chest pain and underlying COPD -clinically better today. Cough mechanics have improved since adding systemic steroids for his pleuritic pain. He is not wheezing. He is negative 1.4 liters last night and > 7 liters neg since admit.  His CXR: as of 9/26 showed diffuse bilateral infiltrates which were a little worse than the day prior.  His f/u sputum was c/w NOF PCT slowly falling.  F/u BC negative to date  Plan Continue aggressive IV lasix for one more day. May need to back off on this in am 9/28 as cr starting to rise. Cont high flow oxygen. There is room to wean this. Currently on 30 L/flow rate and 80%, would wean this for sats > 88% Back on rocephin per ID. Plan for 14d total rx & cont PCT algo Cont systemic steroids at current dosing. Could consider starting taper in am  Cont duoneb  Cont flutter and IS  Repeat CXR in am   I think he is slowly improving   Erick Colace ACNP-BC [place] Pager # [phone] OR # [phone] if no answer    10:28 AM 06/05/17

## 2017-06-06 ENCOUNTER — Inpatient Hospital Stay (HOSPITAL_COMMUNITY): Payer: 59

## 2017-06-06 DIAGNOSIS — R7881 Bacteremia: Secondary | ICD-10-CM

## 2017-06-06 LAB — CBC
HEMATOCRIT: 34.2 % — AB (ref 39.0–52.0)
Hemoglobin: 11.1 g/dL — ABNORMAL LOW (ref 13.0–17.0)
MCH: 27.4 pg (ref 26.0–34.0)
MCHC: 32.5 g/dL (ref 30.0–36.0)
MCV: 84.4 fL (ref 78.0–100.0)
PLATELETS: 335 10*3/uL (ref 150–400)
RBC: 4.05 MIL/uL — ABNORMAL LOW (ref 4.22–5.81)
RDW: 14.1 % (ref 11.5–15.5)
WBC: 29.7 10*3/uL — ABNORMAL HIGH (ref 4.0–10.5)

## 2017-06-06 LAB — BASIC METABOLIC PANEL
Anion gap: 13 (ref 5–15)
BUN: 39 mg/dL — ABNORMAL HIGH (ref 6–20)
CALCIUM: 7.9 mg/dL — AB (ref 8.9–10.3)
CO2: 30 mmol/L (ref 22–32)
CREATININE: 1.14 mg/dL (ref 0.61–1.24)
Chloride: 93 mmol/L — ABNORMAL LOW (ref 101–111)
Glucose, Bld: 180 mg/dL — ABNORMAL HIGH (ref 65–99)
Potassium: 3.6 mmol/L (ref 3.5–5.1)
Sodium: 136 mmol/L (ref 135–145)

## 2017-06-06 MED ORDER — IPRATROPIUM-ALBUTEROL 0.5-2.5 (3) MG/3ML IN SOLN
3.0000 mL | Freq: Three times a day (TID) | RESPIRATORY_TRACT | Status: DC
  Administered 2017-06-07 – 2017-06-13 (×18): 3 mL via RESPIRATORY_TRACT
  Filled 2017-06-06 (×17): qty 3

## 2017-06-06 MED ORDER — METHYLPREDNISOLONE SODIUM SUCC 40 MG IJ SOLR
40.0000 mg | Freq: Two times a day (BID) | INTRAMUSCULAR | Status: DC
  Administered 2017-06-06 – 2017-06-08 (×6): 40 mg via INTRAVENOUS
  Filled 2017-06-06 (×6): qty 1

## 2017-06-06 MED ORDER — FUROSEMIDE 10 MG/ML IJ SOLN
40.0000 mg | Freq: Three times a day (TID) | INTRAMUSCULAR | Status: AC
  Administered 2017-06-06 (×2): 40 mg via INTRAVENOUS
  Filled 2017-06-06 (×2): qty 4

## 2017-06-06 NOTE — Progress Notes (Signed)
PROGRESS NOTE    Travis Salinas  HYQ:[id number] DOB: 10/02/1950 DOA: 05/27/2017 PCP: Kathyrn Lass, MD   Brief Narrative: 66 y.o.malewith medical history significant of hypertension, COPD, depression/anxiety and BPH who presented to the emergency department complaining of progressive worsening shortness of breath for the past 4 days. Patient was seen by his PCP 4 days ago and was diagnosed with COPD exacerbation. She was placed on doxycycline, prednisone and cough syrup. The morning of admission breathing got acutely worse and he developed fevers and chills. CT scan showed multifocal pneumonia. Given profound hypoxia he was admitted to stepdown and PCCM was consulted. He initially had some improvement however over the last few days he became febrile again, and more hypoxic. Still requiring BiPAP   Assessment & Plan:   Principal Problem:   Pneumococcal pneumonia ([place]) Active Problems:   HTN (hypertension)   Chronic hepatitis C without hepatic coma (HCC)   Substance abuse   IVDU (intravenous drug user)   Pneumococcal bacteremia   Acute respiratory failure (HCC)   Normocytic anemia   COPD (chronic obstructive pulmonary disease) (HCC)   Sepsis in the setting of multifocal pneumonia due to Streptococcus pneumoniae -Blood cultures with gram-positive cocci, Streptococcus pneumoniae 1 out of 2 bottles. No routine 2D echo is required for Streptococcus pneumonia bacteremia, discussed with ID on 9/20.  -Respiratory virus panel sent, came back positive with rhinovirus. Supportive treatment. -Patient was febrile on admission, fever has resolved for a few days however starting 9/22 he started spiking again. He also has increased leukocytosis which continues to go up. Blood cultures were sent on 9/22; no growth to date.  -appreciate critical care consultation, patient's antibiotics were broadened to vancomycin and cefepime on 9/24 -Sputum cultures consistent with normal respiratory  flora. -Appreciate ID evaluation, antibiotics narrow to ceftriaxone. Needs 14 days of treatment.    Acute hypoxic respiratory failure, developing ARDS -Due to #1 and #2 -Received IV Lasix on 9-24, chest x-ray 9-25 still with ARDS appearance however slightly improved -More hypoxic overnight. Was off oxygen. Now sat in the highs 87 % on 40  % HFNS.  -BNP at 67.  received IV lasix 40 mg time 2 doses. 9-27 Continue to wean oxygen as possible.  Negative 7 L.   Streptococcus Pneumoniae  Bacteremia;  On IV antibiotics.  Repeated blood culture 9-22 no growth to date.  WBC increase on solumedrol. Chest x ray improved.   COPD exacerbation -In the setting of #1, no further wheezing, continue duo nebs, change Solu-Medrol to prednisone -Critical care followed patient, signed off on 9/21, re-consulted 9/22 due to worsening respiratory status. -on IV solumedrol.   AKI on CKD 2 -Acute kidney injury resolved, creatinine is back to his baseline -monitor cr on lasix.   HTN -continue with atenolol. Hold Norvasc in event of worsening infection.   Depression/anxiety -Continue home medications  BPH -Continue home medications  Polysubstance abuse -UDS positive for cocaine.  SVT; check mg. Replete k. Continue with atenolol.    DVT prophylaxis: Lovenox.  Code Status: Full code.  Family Communication: care discussed with patient.  Disposition Plan: Remain in the step down.    Consultants:   CCM   Procedures:   none   Antimicrobials:   Vancomycin 9-24  Fortaz. 9-24   Subjective: Report cough is better. Dyspnea same. Gets periods of SOB  Had good BM today   Objective: Vitals:   06/06/17 0200 06/06/17 0400 06/06/17 0649 06/06/17 0800  BP: (!) 134/59 (!) 161/64  Marland Kitchen)  115/51  Pulse: 79 96  75  Resp:    12  Temp:   98.2 F (36.8 C)   TempSrc:   Oral   SpO2: 100% (!) 87%  92%  Weight:      Height:        Intake/Output Summary (Last 24 hours) at 06/06/17  0831 Last data filed at 06/06/17 0700  Gross per 24 hour  Intake             1760 ml  Output             1850 ml  Net              -90 ml   Filed Weights   05/30/17 0345 05/31/17 0404 06/01/17 0339  Weight: 70.1 kg (154 lb 8.7 oz) 69.3 kg (152 lb 12.5 oz) 63 kg (138 lb 14.2 oz)    Examination:  General exam; NAD, HFNC Respiratory system:  Bilateral air movement, no wheezing.  Cardiovascular system: S 1, S 2 RRR Gastrointestinal system: BS present, soft, nt Central nervous system: alert and oriented.  Extremities: symmetric power, no edema  Skin: no rash    Data Reviewed: I have personally reviewed following labs and imaging studies  CBC:  Recent Labs Lab 06/02/17 0306 06/03/17 0710 06/04/17 0933 06/05/17 0313 06/06/17 0304  WBC 24.5* 30.3* 26.7* 24.4* 29.7*  HGB 11.1* 11.3* 10.8* 12.0* 11.1*  HCT 33.6* 34.4* 33.5* 36.3* 34.2*  MCV 82.0 82.7 83.3 83.4 84.4  PLT 286 240 237 237 381   Basic Metabolic Panel:  Recent Labs Lab 06/01/17 0309 06/02/17 0306 06/03/17 0710 06/04/17 0319 06/04/17 0933 06/05/17 0313 06/05/17 0823 06/06/17 0304  NA 137 134* 136  --  137 137  --  136  K 3.7 4.3 3.3*  --  3.2* 3.6  --  3.6  CL 100* 100* 98*  --  99* 93*  --  93*  CO2 29 23 26   --  27 30  --  30  GLUCOSE 92 127* 124*  --  119* 138*  --  180*  BUN 25* 26* 25*  --  24* 30*  --  39*  CREATININE 0.88 0.95 1.07 0.99 0.98 1.12  --  1.14  CALCIUM 8.1* 8.3* 7.9*  --  7.9* 8.1*  --  7.9*  MG 2.3  --  1.9  --   --   --  2.1  --    GFR: Estimated Creatinine Clearance: 57.6 mL/min (by C-G formula based on SCr of 1.14 mg/dL). Liver Function Tests:  Recent Labs Lab 05/31/17 0306 06/03/17 0710  AST 47* 53*  ALT 18 20  ALKPHOS 66 103  BILITOT 0.6 0.7  PROT 5.6* 6.4*  ALBUMIN 2.5* 2.4*   No results for input(s): LIPASE, AMYLASE in the last 168 hours. No results for input(s): AMMONIA in the last 168 hours. Coagulation Profile: No results for input(s): INR, PROTIME in the  last 168 hours. Cardiac Enzymes: No results for input(s): CKTOTAL, CKMB, CKMBINDEX, TROPONINI in the last 168 hours. BNP (last 3 results) No results for input(s): PROBNP in the last 8760 hours. HbA1C: No results for input(s): HGBA1C in the last 72 hours. CBG: No results for input(s): GLUCAP in the last 168 hours. Lipid Profile: No results for input(s): CHOL, HDL, LDLCALC, TRIG, CHOLHDL, LDLDIRECT in the last 72 hours. Thyroid Function Tests: No results for input(s): TSH, T4TOTAL, FREET4, T3FREE, THYROIDAB in the last 72 hours. Anemia Panel: No results for input(s): VITAMINB12, FOLATE,  FERRITIN, TIBC, IRON, RETICCTPCT in the last 72 hours. Sepsis Labs:  Recent Labs Lab 05/31/17 0306 06/02/17 0306 06/03/17 0710 06/04/17 0319  PROCALCITON 0.75 0.43 0.48 0.38    Recent Results (from the past 240 hour(s))  MRSA PCR Screening     Status: None   Collection Time: 05/27/17  6:17 PM  Result Value Ref Range Status   MRSA by PCR NEGATIVE NEGATIVE Final    Comment:        The GeneXpert MRSA Assay (FDA approved for NASAL specimens only), is one component of a comprehensive MRSA colonization surveillance program. It is not intended to diagnose MRSA infection nor to guide or monitor treatment for MRSA infections.   Respiratory Panel by PCR     Status: Abnormal   Collection Time: 05/27/17 10:55 PM  Result Value Ref Range Status   Adenovirus NOT DETECTED NOT DETECTED Final   Coronavirus 229E NOT DETECTED NOT DETECTED Final   Coronavirus HKU1 NOT DETECTED NOT DETECTED Final   Coronavirus NL63 NOT DETECTED NOT DETECTED Final   Coronavirus OC43 NOT DETECTED NOT DETECTED Final   Metapneumovirus NOT DETECTED NOT DETECTED Final   Rhinovirus / Enterovirus DETECTED (A) NOT DETECTED Final   Influenza A NOT DETECTED NOT DETECTED Final   Influenza A H1 NOT DETECTED NOT DETECTED Final   Influenza A H1 2009 NOT DETECTED NOT DETECTED Final   Influenza A H3 NOT DETECTED NOT DETECTED Final    Influenza B NOT DETECTED NOT DETECTED Final   Parainfluenza Virus 1 NOT DETECTED NOT DETECTED Final   Parainfluenza Virus 2 NOT DETECTED NOT DETECTED Final   Parainfluenza Virus 3 NOT DETECTED NOT DETECTED Final   Parainfluenza Virus 4 NOT DETECTED NOT DETECTED Final   Respiratory Syncytial Virus NOT DETECTED NOT DETECTED Final   Bordetella pertussis NOT DETECTED NOT DETECTED Final   Chlamydophila pneumoniae NOT DETECTED NOT DETECTED Final   Mycoplasma pneumoniae NOT DETECTED NOT DETECTED Final    Comment: Performed at [facility], [place]. [address], [place], [place] [address]  Culture, blood (Routine X 2) w Reflex to ID Panel     Status: None   Collection Time: 05/31/17  1:20 PM  Result Value Ref Range Status   Specimen Description BLOOD BLOOD RIGHT HAND  Final   Special Requests   Final    BOTTLES DRAWN AEROBIC ONLY Blood Culture adequate volume   Culture   Final    NO GROWTH 5 DAYS Performed at [facility], [place] [address], [place], [place] [address]    Report Status 06/05/2017 FINAL  Final  Culture, blood (Routine X 2) w Reflex to ID Panel     Status: None   Collection Time: 05/31/17  1:26 PM  Result Value Ref Range Status   Specimen Description BLOOD BLOOD LEFT HAND  Final   Special Requests IN PEDIATRIC BOTTLE Blood Culture adequate volume  Final   Culture   Final    NO GROWTH 5 DAYS Performed at [facility], [place] [address], [place], [place] [address]    Report Status 06/05/2017 FINAL  Final  Culture, expectorated sputum-assessment     Status: None   Collection Time: 06/01/17 11:12 AM  Result Value Ref Range Status   Specimen Description SPUTUM  Final   Special Requests NONE  Final   Sputum evaluation THIS SPECIMEN IS ACCEPTABLE FOR SPUTUM CULTURE  Final   Report Status 06/01/2017 FINAL  Final  Culture, respiratory (NON-Expectorated)     Status:  None   Collection Time: 06/01/17 11:12 AM  Result Value Ref Range Status   Specimen Description SPUTUM  Final    Special Requests NONE Reflexed from [id number]  Final   Gram Stain   Final    NO WBC SEEN RARE SQUAMOUS EPITHELIAL CELLS PRESENT FEW GRAM POSITIVE COCCI IN CLUSTERS    Culture   Final    Consistent with normal respiratory flora. Performed at [facility], [place] [address], [place], [place] [address]    Report Status 06/03/2017 FINAL  Final         Radiology Studies: Dg Chest Port 1 View  Result Date: 06/06/2017 CLINICAL DATA:  Pneumonia. EXAM: PORTABLE CHEST 1 VIEW COMPARISON:  06/04/2017. 06/03/2017. 06/02/2017. 05/29/2017. CT 05/27/2017. FINDINGS: Mediastinum and hilar structures normal. Heart size stable. Interval slight improvement of bilateral pulmonary infiltrates/edema. No pleural effusion or pneumothorax. IMPRESSION: Interval slight improvement of bilateral pulmonary infiltrates/edema . Electronically Signed   By: Marcello Moores  Register   On: 06/06/2017 06:11   Dg Chest Port 1 View  Result Date: 06/04/2017 CLINICAL DATA:  Hypoxemia. EXAM: PORTABLE CHEST 1 VIEW COMPARISON:  Yesterday FINDINGS: History of pneumonia and fever. There is diffuse interstitial and airspace opacity with mild asymmetric predominance on the right. No effusion or pneumothorax. Normal heart size and mediastinal contours. IMPRESSION: Stable widespread interstitial and airspace opacity history suggesting infection and/or ARDS. Pulmonary edema could contribute. Electronically Signed   By: Monte Fantasia M.D.   On: 06/04/2017 13:43        Scheduled Meds: . ARIPiprazole  20 mg Oral Daily  . aspirin EC  81 mg Oral Daily  . atenolol  25 mg Oral BH-q7a  . chlorhexidine  15 mL Mouth Rinse BID  . clonazePAM  0.5 mg Oral BID  . dextromethorphan-guaiFENesin  1 tablet Oral BID  . enoxaparin (LOVENOX) injection  40 mg Subcutaneous Q24H  . feeding supplement (ENSURE ENLIVE)  237 mL Oral BID BM  . FLUoxetine  20 mg Oral Daily  . ibuprofen  600 mg Oral QID  . ipratropium-albuterol  3 mL Nebulization Q6H  .  lamoTRIgine  200 mg Oral q morning - 10a  . mouth rinse  15 mL Mouth Rinse q12n4p  . methylPREDNISolone (SOLU-MEDROL) injection  40 mg Intravenous Q8H  . pantoprazole  40 mg Oral Daily  . tamsulosin  0.4 mg Oral Daily   Continuous Infusions: . sodium chloride 10 mL/hr at 06/06/17 0700  . cefTRIAXone (ROCEPHIN)  IV Stopped (06/05/17 2324)     LOS: 10 days    Time spent: 35 minutes.     Elmarie Shiley, MD [facility] Pager [phone]  If 7PM-7AM, please contact night-coverage [email or web address] Password [facility] 06/06/2017, 8:31 AM

## 2017-06-06 NOTE — Progress Notes (Signed)
Patient ID: Travis Salinas, male   DOB: November 14, 1950, 66 y.o.   MRN: [id number]          [facility]  Date of Admission:  05/27/2017   Total days of antibiotics 12        Day 3 ceftriaxone (second round)         ASSESSMENT: He is having slow clinical and radiographic improvement on therapy for bacteremic pneumococcal pneumonia. I recommend 2 more days of IV ceftriaxone.  PLAN: 1. Continue ceftriaxone for 2 more days 2. Recommend physical therapy 3. I will sign off now  Principal Problem:   Pneumococcal pneumonia ([place]) Active Problems:   Pneumococcal bacteremia   Acute respiratory failure (HCC)   HTN (hypertension)   Chronic hepatitis C without hepatic coma ([place])   Substance abuse   IVDU (intravenous drug user)   Normocytic anemia   COPD (chronic obstructive pulmonary disease) ([place])   . ARIPiprazole  20 mg Oral Daily  . aspirin EC  81 mg Oral Daily  . atenolol  25 mg Oral BH-q7a  . chlorhexidine  15 mL Mouth Rinse BID  . clonazePAM  0.5 mg Oral BID  . dextromethorphan-guaiFENesin  1 tablet Oral BID  . enoxaparin (LOVENOX) injection  40 mg Subcutaneous Q24H  . feeding supplement (ENSURE ENLIVE)  237 mL Oral BID BM  . FLUoxetine  20 mg Oral Daily  . furosemide  40 mg Intravenous Q8H  . ibuprofen  600 mg Oral QID  . ipratropium-albuterol  3 mL Nebulization Q6H  . lamoTRIgine  200 mg Oral q morning - 10a  . mouth rinse  15 mL Mouth Rinse q12n4p  . methylPREDNISolone (SOLU-MEDROL) injection  40 mg Intravenous Q12H  . pantoprazole  40 mg Oral Daily  . tamsulosin  0.4 mg Oral Daily    SUBJECTIVE: He says that he had a little bit of a panic attack this morning when he felt like he could not breathe. He is feeling better now. He has a persistent, nonproductive cough.  Review of Systems: Review of Systems  Constitutional: Positive for malaise/fatigue. Negative for chills, diaphoresis and fever.  Respiratory: Positive for cough and shortness of  breath. Negative for hemoptysis and sputum production.   Cardiovascular: Negative for chest pain.  Gastrointestinal: Negative for abdominal pain, diarrhea, nausea and vomiting.    Allergies  Allergen Reactions  . Propoxyphene Nausea And Vomiting  . Amoxicillin Nausea And Vomiting  . Ciprofloxacin Nausea Only  . Depakote [Divalproex Sodium] Other (See Comments)    hallucinations    OBJECTIVE: Vitals:   06/06/17 0649 06/06/17 0800 06/06/17 0851 06/06/17 0900  BP:  (!) 115/51 (!) 115/51   Pulse:  75  88  Resp:  12  18  Temp: 98.2 F (36.8 C)     TempSrc: Oral     SpO2:  92%  (!) 88%  Weight:      Height:       Body mass index is 18.84 kg/m.  Physical Exam  Constitutional: He is oriented to person, place, and time.  Cardiovascular: Normal rate and regular rhythm.   No murmur heard. Pulmonary/Chest: Effort normal and breath sounds normal. He has no wheezes. He has no rales.  He remains on high flow nasal prong oxygen.  Abdominal: Soft. There is no tenderness.  Neurological: He is alert and oriented to person, place, and time.  Skin: No rash noted.  Psychiatric: Mood and affect normal.    Lab Results Lab Results  Component Value Date   WBC 29.7 (H) 06/06/2017   HGB 11.1 (L) 06/06/2017   HCT 34.2 (L) 06/06/2017   MCV 84.4 06/06/2017   PLT 335 06/06/2017    Lab Results  Component Value Date   CREATININE 1.14 06/06/2017   BUN 39 (H) 06/06/2017   NA 136 06/06/2017   K 3.6 06/06/2017   CL 93 (L) 06/06/2017   CO2 30 06/06/2017    Lab Results  Component Value Date   ALT 20 06/03/2017   AST 53 (H) 06/03/2017   ALKPHOS 103 06/03/2017   BILITOT 0.7 06/03/2017     Microbiology: Recent Results (from the past 240 hour(s))  MRSA PCR Screening     Status: None   Collection Time: 05/27/17  6:17 PM  Result Value Ref Range Status   MRSA by PCR NEGATIVE NEGATIVE Final    Comment:        The GeneXpert MRSA Assay (FDA approved for NASAL specimens only), is one  component of a comprehensive MRSA colonization surveillance program. It is not intended to diagnose MRSA infection nor to guide or monitor treatment for MRSA infections.   Respiratory Panel by PCR     Status: Abnormal   Collection Time: 05/27/17 10:55 PM  Result Value Ref Range Status   Adenovirus NOT DETECTED NOT DETECTED Final   Coronavirus 229E NOT DETECTED NOT DETECTED Final   Coronavirus HKU1 NOT DETECTED NOT DETECTED Final   Coronavirus NL63 NOT DETECTED NOT DETECTED Final   Coronavirus OC43 NOT DETECTED NOT DETECTED Final   Metapneumovirus NOT DETECTED NOT DETECTED Final   Rhinovirus / Enterovirus DETECTED (A) NOT DETECTED Final   Influenza A NOT DETECTED NOT DETECTED Final   Influenza A H1 NOT DETECTED NOT DETECTED Final   Influenza A H1 2009 NOT DETECTED NOT DETECTED Final   Influenza A H3 NOT DETECTED NOT DETECTED Final   Influenza B NOT DETECTED NOT DETECTED Final   Parainfluenza Virus 1 NOT DETECTED NOT DETECTED Final   Parainfluenza Virus 2 NOT DETECTED NOT DETECTED Final   Parainfluenza Virus 3 NOT DETECTED NOT DETECTED Final   Parainfluenza Virus 4 NOT DETECTED NOT DETECTED Final   Respiratory Syncytial Virus NOT DETECTED NOT DETECTED Final   Bordetella pertussis NOT DETECTED NOT DETECTED Final   Chlamydophila pneumoniae NOT DETECTED NOT DETECTED Final   Mycoplasma pneumoniae NOT DETECTED NOT DETECTED Final    Comment: Performed at [facility], [place]. [address], [place], [place] [address]  Culture, blood (Routine X 2) w Reflex to ID Panel     Status: None   Collection Time: 05/31/17  1:20 PM  Result Value Ref Range Status   Specimen Description BLOOD BLOOD RIGHT HAND  Final   Special Requests   Final    BOTTLES DRAWN AEROBIC ONLY Blood Culture adequate volume   Culture   Final    NO GROWTH 5 DAYS Performed at [facility], [place] [address] [place], [place] [address]    Report Status 06/05/2017 FINAL  Final  Culture, blood (Routine X 2) w Reflex to  ID Panel     Status: None   Collection Time: 05/31/17  1:26 PM  Result Value Ref Range Status   Specimen Description BLOOD BLOOD LEFT HAND  Final   Special Requests IN PEDIATRIC BOTTLE Blood Culture adequate volume  Final   Culture   Final    NO GROWTH 5 DAYS Performed at [facility], [place] [address], [place], [place] [address]  Report Status 06/05/2017 FINAL  Final  Culture, expectorated sputum-assessment     Status: None   Collection Time: 06/01/17 11:12 AM  Result Value Ref Range Status   Specimen Description SPUTUM  Final   Special Requests NONE  Final   Sputum evaluation THIS SPECIMEN IS ACCEPTABLE FOR SPUTUM CULTURE  Final   Report Status 06/01/2017 FINAL  Final  Culture, respiratory (NON-Expectorated)     Status: None   Collection Time: 06/01/17 11:12 AM  Result Value Ref Range Status   Specimen Description SPUTUM  Final   Special Requests NONE Reflexed from [id number]  Final   Gram Stain   Final    NO WBC SEEN RARE SQUAMOUS EPITHELIAL CELLS PRESENT FEW GRAM POSITIVE COCCI IN CLUSTERS    Culture   Final    Consistent with normal respiratory flora. Performed at [facility], [place] [address] [place], [place] [address]    Report Status 06/03/2017 FINAL  Final    Michel Bickers, MD [facility] [phone] pager   [phone] cell 06/06/2017, 11:12 AM

## 2017-06-06 NOTE — Progress Notes (Addendum)
[facility] Attending Note  ADMISSION DATE:05/27/2017  CONSULTATION DATE:05/27/2017  REFERRING MD:Dr. Patrecia Pour   CHIEF COMPLAINT:Acute Hypoxic Respiratory Failure  Presenting HPI:  66 y.o. male presenting with dyspnea, fever to 101.25F, chills, weakness, and acute hypoxic respiratory failure. Presumed to be secondary to community-acquired pneumonia versus acute exacerbation of underlying COPD. Patient has a history of essential hypertension and hepatitis C viral infection. Patient also has polysubstance abuse including cocaine.  Subjective:  No new chest pain or pressure. No pleurisy. No abdominal pain or nausea. Patient did have bowel movement yesterday.  Review of Systems:  Denies any subjective fever or chills. No headache or vision changes.  Temp:  [97.7 F (36.5 C)-98.4 F (36.9 C)] 98.2 F (36.8 C) (09/28 0649) Pulse Rate:  [4-96] 75 (09/28 0800) Resp:  [12-29] 12 (09/28 0800) BP: (100-161)/(26-90) 115/51 (09/28 0800) SpO2:  [76 %-100 %] 92 % (09/28 0800) FiO2 (%):  [65 %-75 %] 65 % (09/28 0800)  Intake/Output Summary (Last 24 hours) at 06/06/17 0845 Last data filed at 06/06/17 0700  Gross per 24 hour  Intake             1760 ml  Output             1850 ml  Net              -90 ml   General:  Awake. Alert. No acute distress. Respiratory therapist and nurse at bedside. Integument:  Warm. Dry. No rash. Extremities:  No cyanosis or clubbing.  HEENT:  No scleral icterus. No scleral injection. Cardiovascular:  Regular rate. No edema. No appreciable JVD.  Pulmonary:  Good aeration bilaterally. Mouth breathing with increased dyspnea witnessed. Mildly increased work of breathing. Abdomen: Soft. Normal bowel sounds. Nondistended. Musculoskeletal:  Normal bulk and tone. No joint deformity or effusion appreciated.  CBC Latest Ref Rng & Units 06/06/2017 06/05/2017 06/04/2017  WBC 4.0 - 10.5 K/uL 29.7(H) 24.4(H) 26.7(H)  Hemoglobin 13.0 - 17.0  g/dL 11.1(L) 12.0(L) 10.8(L)  Hematocrit 39.0 - 52.0 % 34.2(L) 36.3(L) 33.5(L)  Platelets 150 - 400 K/uL 335 237 237   BMP Latest Ref Rng & Units 06/06/2017 06/05/2017 06/04/2017  Glucose 65 - 99 mg/dL 180(H) 138(H) 119(H)  BUN 6 - 20 mg/dL 39(H) 30(H) 24(H)  Creatinine 0.61 - 1.24 mg/dL 1.14 1.12 0.98  Sodium 135 - 145 mmol/L 136 137 137  Potassium 3.5 - 5.1 mmol/L 3.6 3.6 3.2(L)  Chloride 101 - 111 mmol/L 93(L) 93(L) 99(L)  CO2 22 - 32 mmol/L 30 30 27   Calcium 8.9 - 10.3 mg/dL 7.9(L) 8.1(L) 7.9(L)   Recent Labs Lab 05/31/17 0306  06/02/17 0306 06/03/17 0710 06/04/17 0319 06/04/17 0933 06/05/17 0313 06/06/17 0304  PROCALCITON 0.75  --  0.43 0.48 0.38  --   --   --   WBC 15.3*  < > 24.5* 30.3*  --  26.7* 24.4* 29.7*  < > = values in this interval not displayed.    IMAGING/STUDIES: V/Q SCAN 9/18:  Very low probability for pulmonary embolism (0 - 9%). UDS 9/18:  Positive Cocaine & Opiates  CT CHEST W/O 9/18:  Personally reviewed by me. No pleural effusion appreciated. No pericardial effusion. Precarinal mediastinal lymph node and large. Suspect subcarinal lymph node enlargement as well. Patchy bilateral alveolar opacities that are groundglass in the mid to upper lung zones and more consolidation within the dependent bilateral lower lobes. TTE 9/22:  Mild concentric LVH with EF 60-65%. Normal regional wall motion. Grade  1 diastolic dysfunction. LA & RA normal in size. RV normal in size and function. No aortic stenosis or regurgitation. Aortic root normal in size. Trivial mitral regurgitation without stenosis. No pulmonic stenosis or regurgitation. Mild tricuspid regurgitation. No pericardial effusion. PORT CXR 9/24:  Previously reviewed by me. Patchy bilateral alveolar opacities with some worsening in the right lower lung. Silhouetting of the right hemidiaphragm which is new compared with earlier chest x-ray imaging. PORT CXR 9/25:  Previously reviewed by me. No pleural effusion  appreciated. Slight improvement in bilateral patchy alveolar opacities compared with x-ray imaging from 9/22. PORT CXR 9/28:  Personally reviewed by me. Questionable marginal improvement in bilateral alveolar opacities. No pleural effusion appreciated. No new focal consolidation appreciated.  MICROBIOLOGY: MRSA PCR 9/18:  Negative Respiratory Viral Panel PCR 9/18:  Rhinovirus Influenza A/B PCR 9/28:  Negative  Blood Cultures x2 9/18:  1/2 Positive Streptococcus pneumoniae  Blood Cultures x2 9/22:  Negative  Sputum Culture 9/23:  Normal Respiratory Flora   ANTIBIOTICS: Zosyn 9/18 - 9/19 Azithromycin 9/18 - 9/19 Rocephin 9/18 - 9/24 Cefepime 9/24 (x1 dose) Tressie Ellis 9/24 >>>9/26 Vancomycin 9/24 >>>9/26 Rocephin 9/26>>  ASSESSMENT/PLAN:  66 y.o. Male with polysubstance abuse and acute hypoxic respiratory failure. Lung injury secondary to Streptococcus pneumonia with subsequent bacteremia. Pleurisy remains improved. Renal function and tolerating diuresis.  1. Acute hypoxic respiratory failure: Slow improvement. Continuing gentle diuresis with Lasix IV 2 doses today. Continuing high flow nasal cannula oxygen. Continuous pulse oximetry monitoring. Patient educated/encouraged to focus on breathing in through his nose to maximize oxygen therapy. 2. COPD with acute exacerbation: Transitioning Solu-Medrol to IV every 12 hours. Continuing scheduled Duonebs. 3. Pleurisy:  Improving. Secondary to pneumonia. Continuing ibuprofen scheduled & Solu-Medrol IV every 12 hours. 4. Streptococcus pneumonia & bacteremia:  Antibiotic regimen per IV. Continuing on Rocephin. Plan for 2 week course.  I have spent a total of 37 minutes of time today caring for the patient, reviewing the patient's electronic medical record, and with more than 50% of that time spent coordinating care with the patient as well as reviewing the continuing plan of care with the patient at bedside.  Remainder of care as per primary service  and other consultants.  I will be available as needed over the weekend and will plan on reassessing the patient on Monday.  Sonia Baller Ashok Cordia, M.D. [facility] Pager:  [phone] After 3pm or if no response, call [phone]  8:45 AM 06/06/17

## 2017-06-07 LAB — CBC
HCT: 35.4 % — ABNORMAL LOW (ref 39.0–52.0)
Hemoglobin: 11.5 g/dL — ABNORMAL LOW (ref 13.0–17.0)
MCH: 27.6 pg (ref 26.0–34.0)
MCHC: 32.5 g/dL (ref 30.0–36.0)
MCV: 84.9 fL (ref 78.0–100.0)
PLATELETS: 352 10*3/uL (ref 150–400)
RBC: 4.17 MIL/uL — AB (ref 4.22–5.81)
RDW: 14.4 % (ref 11.5–15.5)
WBC: 21.4 10*3/uL — AB (ref 4.0–10.5)

## 2017-06-07 LAB — RENAL FUNCTION PANEL
Albumin: 2.2 g/dL — ABNORMAL LOW (ref 3.5–5.0)
Anion gap: 11 (ref 5–15)
BUN: 37 mg/dL — ABNORMAL HIGH (ref 6–20)
CO2: 33 mmol/L — ABNORMAL HIGH (ref 22–32)
CREATININE: 0.95 mg/dL (ref 0.61–1.24)
Calcium: 7.9 mg/dL — ABNORMAL LOW (ref 8.9–10.3)
Chloride: 92 mmol/L — ABNORMAL LOW (ref 101–111)
GFR calc non Af Amer: >60 mL/min (ref >60)
Glucose, Bld: 112 mg/dL — ABNORMAL HIGH (ref 65–99)
Phosphorus: 2.9 mg/dL (ref 2.5–4.6)
Potassium: 3.8 mmol/L (ref 3.5–5.1)
SODIUM: 136 mmol/L (ref 135–145)

## 2017-06-07 MED ORDER — FUROSEMIDE 10 MG/ML IJ SOLN
40.0000 mg | Freq: Once | INTRAMUSCULAR | Status: AC
  Administered 2017-06-07: 40 mg via INTRAVENOUS
  Filled 2017-06-07: qty 4

## 2017-06-07 MED ORDER — POTASSIUM CHLORIDE CRYS ER 20 MEQ PO TBCR
20.0000 meq | EXTENDED_RELEASE_TABLET | Freq: Once | ORAL | Status: AC
  Administered 2017-06-07: 20 meq via ORAL
  Filled 2017-06-07: qty 1

## 2017-06-07 NOTE — Progress Notes (Signed)
PROGRESS NOTE    Travis Salinas  OXB:[id number] DOB: 1950/12/10 DOA: 05/27/2017 PCP: Kathyrn Lass, MD   Brief Narrative: 66 y.o.malewith medical history significant of hypertension, COPD, depression/anxiety and BPH who presented to the emergency department complaining of progressive worsening shortness of breath for the past 4 days. Patient was seen by his PCP 4 days ago and was diagnosed with COPD exacerbation. She was placed on doxycycline, prednisone and cough syrup. The morning of admission breathing got acutely worse and he developed fevers and chills. CT scan showed multifocal pneumonia. Given profound hypoxia he was admitted to stepdown and PCCM was consulted. He initially had some improvement however over the last few days he became febrile again, and more hypoxic.    Assessment & Plan:   Principal Problem:   Pneumococcal pneumonia ([address]) Active Problems:   HTN (hypertension)   Chronic hepatitis C without hepatic coma (HCC)   Substance abuse   IVDU (intravenous drug user)   Pneumococcal bacteremia   Acute respiratory failure (HCC)   Normocytic anemia   COPD (chronic obstructive pulmonary disease) (HCC)   Sepsis in the setting of multifocal pneumonia due to Streptococcus pneumoniae -Blood cultures with gram-positive cocci, Streptococcus pneumoniae 1 out of 2 bottles. No routine 2D echo is required for Streptococcus pneumonia bacteremia, discussed with ID on 9/20.  -Respiratory virus panel sent, came back positive with rhinovirus. Supportive treatment. -Patient was febrile on admission, fever has resolved for a few days however starting 9/22 he started spiking again. He also has increased leukocytosis which continues to go up. Blood cultures were sent on 9/22; no growth to date.  -appreciate critical care consultation, patient's antibiotics were broadened to vancomycin and cefepime on 9/24 -Sputum cultures consistent with normal respiratory flora. -Appreciate ID  evaluation, antibiotics narrow to ceftriaxone. Needs 14 days of treatment.  -needs 2 more days of ceftriaxone.   Acute hypoxic respiratory failure, developing ARDS -Due to #1 and #2 -Received IV Lasix on 9-24, chest x-ray 9-25 still with ARDS appearance however slightly improved -More hypoxic overnight. Was off oxygen. Now sat in the highs 87 % on 40  % HFNS.  -BNP at 67.  Will give one time dose of lasix.  Continue to wean oxygen as possible.  Negative 8 L.  Now on solumedrol 40 mg BID>   Streptococcus Pneumoniae  Bacteremia;  On IV antibiotics.  Repeated blood culture 9-22 no growth to date.  WBC increase on solumedrol. Chest x ray improved.   COPD exacerbation -In the setting of #1, no further wheezing, continue duo nebs, change Solu-Medrol to prednisone -Critical care followed patient, signed off on 9/21, re-consulted 9/22 due to worsening respiratory status. -on IV solumedrol.   AKI on CKD 2 -Acute kidney injury resolved, creatinine is back to his baseline -monitor cr on lasix.   HTN -continue with atenolol. Hold Norvasc in event of worsening infection.   Depression/anxiety -Continue home medications  BPH -Continue home medications  Polysubstance abuse -UDS positive for cocaine.  SVT; check mg. Replete k. Continue with atenolol.    DVT prophylaxis: Lovenox.  Code Status: Full code.  Family Communication: care discussed with patient.  Disposition Plan: Remain in the step down.    Consultants:   CCM   Procedures:   none   Antimicrobials:   Vancomycin 9-24  Fortaz. 9-24   Subjective: Cough better. Breathing better   Objective: Vitals:   06/07/17 0642 06/07/17 0700 06/07/17 0715 06/07/17 0739  BP: (!) 160/62     Pulse: Marland Kitchen)  102 99 97   Resp:  15 16   Temp:      TempSrc:      SpO2:  94% 92% 95%  Weight:      Height:        Intake/Output Summary (Last 24 hours) at 06/07/17 0824 Last data filed at 06/07/17 0700  Gross per 24 hour    Intake              285 ml  Output             1625 ml  Net            -1340 ml   Filed Weights   05/30/17 0345 05/31/17 0404 06/01/17 0339  Weight: 70.1 kg (154 lb 8.7 oz) 69.3 kg (152 lb 12.5 oz) 63 kg (138 lb 14.2 oz)    Examination:  General exam; NAD Respiratory system:  Bilateral air movement, bilateral crackles. No wheezing.  Cardiovascular system: S 1,. S 2 RRR Gastrointestinal system: BS present , soft, nt Central nervous system: alert and oriented.  Extremities: no edema  Skin: no rash    Data Reviewed: I have personally reviewed following labs and imaging studies  CBC:  Recent Labs Lab 06/03/17 0710 06/04/17 0933 06/05/17 0313 06/06/17 0304 06/07/17 0717  WBC 30.3* 26.7* 24.4* 29.7* 21.4*  HGB 11.3* 10.8* 12.0* 11.1* 11.5*  HCT 34.4* 33.5* 36.3* 34.2* 35.4*  MCV 82.7 83.3 83.4 84.4 84.9  PLT 240 237 237 335 696   Basic Metabolic Panel:  Recent Labs Lab 06/01/17 0309  06/03/17 0710 06/04/17 0319 06/04/17 0933 06/05/17 0313 06/05/17 0823 06/06/17 0304 06/07/17 0332  NA 137  < > 136  --  137 137  --  136 136  K 3.7  < > 3.3*  --  3.2* 3.6  --  3.6 3.8  CL 100*  < > 98*  --  99* 93*  --  93* 92*  CO2 29  < > 26  --  27 30  --  30 33*  GLUCOSE 92  < > 124*  --  119* 138*  --  180* 112*  BUN 25*  < > 25*  --  24* 30*  --  39* 37*  CREATININE 0.88  < > 1.07 0.99 0.98 1.12  --  1.14 0.95  CALCIUM 8.1*  < > 7.9*  --  7.9* 8.1*  --  7.9* 7.9*  MG 2.3  --  1.9  --   --   --  2.1  --   --   PHOS  --   --   --   --   --   --   --   --  2.9  < > = values in this interval not displayed. GFR: Estimated Creatinine Clearance: 69.1 mL/min (by C-G formula based on SCr of 0.95 mg/dL). Liver Function Tests:  Recent Labs Lab 06/03/17 0710 06/07/17 0332  AST 53*  --   ALT 20  --   ALKPHOS 103  --   BILITOT 0.7  --   PROT 6.4*  --   ALBUMIN 2.4* 2.2*   No results for input(s): LIPASE, AMYLASE in the last 168 hours. No results for input(s): AMMONIA in  the last 168 hours. Coagulation Profile: No results for input(s): INR, PROTIME in the last 168 hours. Cardiac Enzymes: No results for input(s): CKTOTAL, CKMB, CKMBINDEX, TROPONINI in the last 168 hours. BNP (last 3 results) No results for input(s): PROBNP in the last  8760 hours. HbA1C: No results for input(s): HGBA1C in the last 72 hours. CBG: No results for input(s): GLUCAP in the last 168 hours. Lipid Profile: No results for input(s): CHOL, HDL, LDLCALC, TRIG, CHOLHDL, LDLDIRECT in the last 72 hours. Thyroid Function Tests: No results for input(s): TSH, T4TOTAL, FREET4, T3FREE, THYROIDAB in the last 72 hours. Anemia Panel: No results for input(s): VITAMINB12, FOLATE, FERRITIN, TIBC, IRON, RETICCTPCT in the last 72 hours. Sepsis Labs:  Recent Labs Lab 06/02/17 0306 06/03/17 0710 06/04/17 0319  PROCALCITON 0.43 0.48 0.38    Recent Results (from the past 240 hour(s))  Culture, blood (Routine X 2) w Reflex to ID Panel     Status: None   Collection Time: 05/31/17  1:20 PM  Result Value Ref Range Status   Specimen Description BLOOD BLOOD RIGHT HAND  Final   Special Requests   Final    BOTTLES DRAWN AEROBIC ONLY Blood Culture adequate volume   Culture   Final    NO GROWTH 5 DAYS Performed at [facility], [place] [address] [place], [place] [address]    Report Status 06/05/2017 FINAL  Final  Culture, blood (Routine X 2) w Reflex to ID Panel     Status: None   Collection Time: 05/31/17  1:26 PM  Result Value Ref Range Status   Specimen Description BLOOD BLOOD LEFT HAND  Final   Special Requests IN PEDIATRIC BOTTLE Blood Culture adequate volume  Final   Culture   Final    NO GROWTH 5 DAYS Performed at [facility], [place] [address], [place], [place] [address]    Report Status 06/05/2017 FINAL  Final  Culture, expectorated sputum-assessment     Status: None   Collection Time: 06/01/17 11:12 AM  Result Value Ref Range Status   Specimen Description SPUTUM  Final    Special Requests NONE  Final   Sputum evaluation THIS SPECIMEN IS ACCEPTABLE FOR SPUTUM CULTURE  Final   Report Status 06/01/2017 FINAL  Final  Culture, respiratory (NON-Expectorated)     Status: None   Collection Time: 06/01/17 11:12 AM  Result Value Ref Range Status   Specimen Description SPUTUM  Final   Special Requests NONE Reflexed from [id number]  Final   Gram Stain   Final    NO WBC SEEN RARE SQUAMOUS EPITHELIAL CELLS PRESENT FEW GRAM POSITIVE COCCI IN CLUSTERS    Culture   Final    Consistent with normal respiratory flora. Performed at [facility], [place] [address], [place], [place] [address]    Report Status 06/03/2017 FINAL  Final         Radiology Studies: Dg Chest Port 1 View  Result Date: 06/06/2017 CLINICAL DATA:  Pneumonia. EXAM: PORTABLE CHEST 1 VIEW COMPARISON:  06/04/2017. 06/03/2017. 06/02/2017. 05/29/2017. CT 05/27/2017. FINDINGS: Mediastinum and hilar structures normal. Heart size stable. Interval slight improvement of bilateral pulmonary infiltrates/edema. No pleural effusion or pneumothorax. IMPRESSION: Interval slight improvement of bilateral pulmonary infiltrates/edema . Electronically Signed   By: Marcello Moores  Register   On: 06/06/2017 06:11        Scheduled Meds: . ARIPiprazole  20 mg Oral Daily  . aspirin EC  81 mg Oral Daily  . atenolol  25 mg Oral BH-q7a  . chlorhexidine  15 mL Mouth Rinse BID  . clonazePAM  0.5 mg Oral BID  . dextromethorphan-guaiFENesin  1 tablet Oral BID  . enoxaparin (LOVENOX) injection  40 mg Subcutaneous Q24H  . feeding supplement (ENSURE ENLIVE)  237  mL Oral BID BM  . FLUoxetine  20 mg Oral Daily  . ibuprofen  600 mg Oral QID  . ipratropium-albuterol  3 mL Nebulization TID  . lamoTRIgine  200 mg Oral q morning - 10a  . mouth rinse  15 mL Mouth Rinse q12n4p  . methylPREDNISolone (SOLU-MEDROL) injection  40 mg Intravenous Q12H  . pantoprazole  40 mg Oral Daily  . tamsulosin  0.4 mg Oral Daily   Continuous  Infusions: . sodium chloride 10 mL/hr at 06/07/17 0700  . cefTRIAXone (ROCEPHIN)  IV Stopped (06/06/17 2230)     LOS: 11 days    Time spent: 35 minutes.     Elmarie Shiley, MD [facility] Pager [phone]  If 7PM-7AM, please contact night-coverage [email or web address] Password [facility] 06/07/2017, 8:24 AM

## 2017-06-08 LAB — BASIC METABOLIC PANEL
ANION GAP: 8 (ref 5–15)
BUN: 31 mg/dL — ABNORMAL HIGH (ref 6–20)
CALCIUM: 7.9 mg/dL — AB (ref 8.9–10.3)
CHLORIDE: 94 mmol/L — AB (ref 101–111)
CO2: 32 mmol/L (ref 22–32)
Creatinine, Ser: 0.82 mg/dL (ref 0.61–1.24)
GFR calc Af Amer: >60 mL/min (ref >60)
GFR calc non Af Amer: >60 mL/min (ref >60)
GLUCOSE: 178 mg/dL — AB (ref 65–99)
POTASSIUM: 3.7 mmol/L (ref 3.5–5.1)
Sodium: 134 mmol/L — ABNORMAL LOW (ref 135–145)

## 2017-06-08 LAB — CBC
HEMATOCRIT: 32.4 % — AB (ref 39.0–52.0)
HEMOGLOBIN: 10.2 g/dL — AB (ref 13.0–17.0)
MCH: 26.8 pg (ref 26.0–34.0)
MCHC: 31.5 g/dL (ref 30.0–36.0)
MCV: 85.3 fL (ref 78.0–100.0)
Platelets: 367 10*3/uL (ref 150–400)
RBC: 3.8 MIL/uL — ABNORMAL LOW (ref 4.22–5.81)
RDW: 13.9 % (ref 11.5–15.5)
WBC: 20.7 10*3/uL — AB (ref 4.0–10.5)

## 2017-06-08 MED ORDER — PHENOL 1.4 % MT LIQD
1.0000 | OROMUCOSAL | Status: DC | PRN
  Administered 2017-06-08: 1 via OROMUCOSAL
  Filled 2017-06-08: qty 177

## 2017-06-08 MED ORDER — POTASSIUM CHLORIDE CRYS ER 20 MEQ PO TBCR
20.0000 meq | EXTENDED_RELEASE_TABLET | Freq: Once | ORAL | Status: AC
  Administered 2017-06-08: 20 meq via ORAL
  Filled 2017-06-08: qty 1

## 2017-06-08 MED ORDER — FUROSEMIDE 10 MG/ML IJ SOLN
40.0000 mg | Freq: Two times a day (BID) | INTRAMUSCULAR | Status: AC
  Administered 2017-06-08 (×2): 40 mg via INTRAVENOUS
  Filled 2017-06-08 (×2): qty 4

## 2017-06-08 MED ORDER — OXYCODONE HCL ER 15 MG PO T12A: 15.0000 mg | EXTENDED_RELEASE_TABLET | Freq: Two times a day (BID) | ORAL | Status: DC

## 2017-06-08 NOTE — Progress Notes (Signed)
PROGRESS NOTE    Travis Salinas  WFU:[id number] DOB: 05-Mar-1951 DOA: 05/27/2017 PCP: Kathyrn Lass, MD   Brief Narrative: 66 y.o.malewith medical history significant of hypertension, COPD, depression/anxiety and BPH who presented to the emergency department complaining of progressive worsening shortness of breath for the past 4 days. Patient was seen by his PCP 4 days ago and was diagnosed with COPD exacerbation. She was placed on doxycycline, prednisone and cough syrup. The morning of admission breathing got acutely worse and he developed fevers and chills. CT scan showed multifocal pneumonia. Given profound hypoxia he was admitted to stepdown and PCCM was consulted. He initially had some improvement however over the last few days he became febrile again, and more hypoxic.    Assessment & Plan:   Principal Problem:   Pneumococcal pneumonia ([place]) Active Problems:   HTN (hypertension)   Chronic hepatitis C without hepatic coma (HCC)   Substance abuse   IVDU (intravenous drug user)   Pneumococcal bacteremia   Acute respiratory failure (HCC)   Normocytic anemia   COPD (chronic obstructive pulmonary disease) (HCC)   Sepsis in the setting of multifocal pneumonia due to Streptococcus pneumoniae -Blood cultures with gram-positive cocci, Streptococcus pneumoniae 1 out of 2 bottles. No routine 2D echo is required for Streptococcus pneumonia bacteremia, discussed with ID on 9/20.  -Respiratory virus panel sent, came back positive with rhinovirus. Supportive treatment. -Patient was febrile on admission, fever has resolved for a few days however starting 9/22 he started spiking again. He also has increased leukocytosis which continues to go up. Blood cultures were sent on 9/22; no growth to date.  -appreciate critical care consultation, patient's antibiotics were broadened to vancomycin and cefepime on 9/24 -Sputum cultures consistent with normal respiratory flora. -Appreciate ID  evaluation, antibiotics narrow to ceftriaxone. Needs 14 days of treatment.  -needs one  more days of ceftriaxone.   Acute hypoxic respiratory failure, developing ARDS -Due to #1 and #2 -Received IV Lasix on 9-24, chest x-ray 9-25 still with ARDS appearance however slightly improved -More hypoxic overnight. Was off oxygen. Now sat in the highs 87 % on 40  % HFNS.  -BNP at 67.  Lasix 40 mg BID>  Continue to wean oxygen as possible.  Negative 8 L.  Now on solumedrol 40 mg BID>  Complaining of pain, offer to start oxycontin, he decline.   Streptococcus Pneumoniae  Bacteremia;  On IV antibiotics.  Repeated blood culture 9-22 no growth to date.  WBC increase on solumedrol. Chest x ray improved.  WBC trending down.   COPD exacerbation -In the setting of #1, no further wheezing, continue duo nebs, change Solu-Medrol to prednisone -Critical care followed patient, signed off on 9/21, re-consulted 9/22 due to worsening respiratory status. -on IV solumedrol.   AKI on CKD 2 -Acute kidney injury resolved, creatinine is back to his baseline -monitor cr on lasix.   HTN -continue with atenolol. Hold Norvasc in event of worsening infection.   Depression/anxiety -Continue home medications  BPH -Continue home medications  Polysubstance abuse -UDS positive for cocaine.  SVT; check mg. Replete k. Continue with atenolol.    DVT prophylaxis: Lovenox.  Code Status: Full code.  Family Communication: care discussed with patient.  Disposition Plan: Remain in the step down.    Consultants:   CCM   Procedures:   none   Antimicrobials:   Vancomycin 9-24  Fortaz. 9-24   Subjective: He is complaining of chest pain, he is asking for dilaudid., I offer to start  home dose OxyContin , he relates that he doesn't take that every day and it make him sick.    Objective: Vitals:   06/08/17 0353 06/08/17 0400 06/08/17 0751 06/08/17 0752  BP:  131/63    Pulse:  72    Resp:  17     Temp: 98.3 F (36.8 C)   98.5 F (36.9 C)  TempSrc: Oral   Oral  SpO2:  95% 99%   Weight:      Height:        Intake/Output Summary (Last 24 hours) at 06/08/17 0819 Last data filed at 06/08/17 0600  Gross per 24 hour  Intake            61.67 ml  Output              625 ml  Net          -563.33 ml   Filed Weights   05/30/17 0345 05/31/17 0404 06/01/17 0339  Weight: 70.1 kg (154 lb 8.7 oz) 69.3 kg (152 lb 12.5 oz) 63 kg (138 lb 14.2 oz)    Examination:  General exam; NAD Respiratory system:  Bilateral air movement, crackles a t bases.  Cardiovascular system: S 1, S 2 RRR Gastrointestinal system: BS present, soft,.  Central nervous system: alert and oriented.  Extremities: no edema Skin: no rash    Data Reviewed: I have personally reviewed following labs and imaging studies  CBC:  Recent Labs Lab 06/04/17 0933 06/05/17 0313 06/06/17 0304 06/07/17 0717 06/08/17 0323  WBC 26.7* 24.4* 29.7* 21.4* 20.7*  HGB 10.8* 12.0* 11.1* 11.5* 10.2*  HCT 33.5* 36.3* 34.2* 35.4* 32.4*  MCV 83.3 83.4 84.4 84.9 85.3  PLT 237 237 335 352 626   Basic Metabolic Panel:  Recent Labs Lab 06/03/17 0710  06/04/17 0933 06/05/17 0313 06/05/17 0823 06/06/17 0304 06/07/17 0332 06/08/17 0323  NA 136  --  137 137  --  136 136 134*  K 3.3*  --  3.2* 3.6  --  3.6 3.8 3.7  CL 98*  --  99* 93*  --  93* 92* 94*  CO2 26  --  27 30  --  30 33* 32  GLUCOSE 124*  --  119* 138*  --  180* 112* 178*  BUN 25*  --  24* 30*  --  39* 37* 31*  CREATININE 1.07  < > 0.98 1.12  --  1.14 0.95 0.82  CALCIUM 7.9*  --  7.9* 8.1*  --  7.9* 7.9* 7.9*  MG 1.9  --   --   --  2.1  --   --   --   PHOS  --   --   --   --   --   --  2.9  --   < > = values in this interval not displayed. GFR: Estimated Creatinine Clearance: 80 mL/min (by C-G formula based on SCr of 0.82 mg/dL). Liver Function Tests:  Recent Labs Lab 06/03/17 0710 06/07/17 0332  AST 53*  --   ALT 20  --   ALKPHOS 103  --   BILITOT 0.7   --   PROT 6.4*  --   ALBUMIN 2.4* 2.2*   No results for input(s): LIPASE, AMYLASE in the last 168 hours. No results for input(s): AMMONIA in the last 168 hours. Coagulation Profile: No results for input(s): INR, PROTIME in the last 168 hours. Cardiac Enzymes: No results for input(s): CKTOTAL, CKMB, CKMBINDEX, TROPONINI in the last 168  hours. BNP (last 3 results) No results for input(s): PROBNP in the last 8760 hours. HbA1C: No results for input(s): HGBA1C in the last 72 hours. CBG: No results for input(s): GLUCAP in the last 168 hours. Lipid Profile: No results for input(s): CHOL, HDL, LDLCALC, TRIG, CHOLHDL, LDLDIRECT in the last 72 hours. Thyroid Function Tests: No results for input(s): TSH, T4TOTAL, FREET4, T3FREE, THYROIDAB in the last 72 hours. Anemia Panel: No results for input(s): VITAMINB12, FOLATE, FERRITIN, TIBC, IRON, RETICCTPCT in the last 72 hours. Sepsis Labs:  Recent Labs Lab 06/02/17 0306 06/03/17 0710 06/04/17 0319  PROCALCITON 0.43 0.48 0.38    Recent Results (from the past 240 hour(s))  Culture, blood (Routine X 2) w Reflex to ID Panel     Status: None   Collection Time: 05/31/17  1:20 PM  Result Value Ref Range Status   Specimen Description BLOOD BLOOD RIGHT HAND  Final   Special Requests   Final    BOTTLES DRAWN AEROBIC ONLY Blood Culture adequate volume   Culture   Final    NO GROWTH 5 DAYS Performed at [facility], [place] [address], [place], [place] [address]    Report Status 06/05/2017 FINAL  Final  Culture, blood (Routine X 2) w Reflex to ID Panel     Status: None   Collection Time: 05/31/17  1:26 PM  Result Value Ref Range Status   Specimen Description BLOOD BLOOD LEFT HAND  Final   Special Requests IN PEDIATRIC BOTTLE Blood Culture adequate volume  Final   Culture   Final    NO GROWTH 5 DAYS Performed at [facility], [place] [address], [place], [place] [address]    Report Status 06/05/2017 FINAL  Final  Culture, expectorated  sputum-assessment     Status: None   Collection Time: 06/01/17 11:12 AM  Result Value Ref Range Status   Specimen Description SPUTUM  Final   Special Requests NONE  Final   Sputum evaluation THIS SPECIMEN IS ACCEPTABLE FOR SPUTUM CULTURE  Final   Report Status 06/01/2017 FINAL  Final  Culture, respiratory (NON-Expectorated)     Status: None   Collection Time: 06/01/17 11:12 AM  Result Value Ref Range Status   Specimen Description SPUTUM  Final   Special Requests NONE Reflexed from [id number]  Final   Gram Stain   Final    NO WBC SEEN RARE SQUAMOUS EPITHELIAL CELLS PRESENT FEW GRAM POSITIVE COCCI IN CLUSTERS    Culture   Final    Consistent with normal respiratory flora. Performed at [facility], [place] [address], [place],  [address]    Report Status 06/03/2017 FINAL  Final         Radiology Studies: No results found.      Scheduled Meds: . ARIPiprazole  20 mg Oral Daily  . aspirin EC  81 mg Oral Daily  . atenolol  25 mg Oral BH-q7a  . chlorhexidine  15 mL Mouth Rinse BID  . clonazePAM  0.5 mg Oral BID  . dextromethorphan-guaiFENesin  1 tablet Oral BID  . enoxaparin (LOVENOX) injection  40 mg Subcutaneous Q24H  . feeding supplement (ENSURE ENLIVE)  237 mL Oral BID BM  . FLUoxetine  20 mg Oral Daily  . furosemide  40 mg Intravenous Q12H  . ibuprofen  600 mg Oral QID  . ipratropium-albuterol  3 mL Nebulization TID  . lamoTRIgine  200 mg Oral q morning - 10a  . mouth rinse  15 mL Mouth  Rinse q12n4p  . methylPREDNISolone (SOLU-MEDROL) injection  40 mg Intravenous Q12H  . pantoprazole  40 mg Oral Daily  . tamsulosin  0.4 mg Oral Daily   Continuous Infusions: . sodium chloride Stopped (06/07/17 0910)  . cefTRIAXone (ROCEPHIN)  IV Stopped (06/07/17 2131)     LOS: 12 days    Time spent: 35 minutes.     Elmarie Shiley, MD [facility] Pager [phone]  If 7PM-7AM, please contact night-coverage [email or web address] Password [email or web address] 06/08/2017,  8:19 AM

## 2017-06-09 ENCOUNTER — Inpatient Hospital Stay (HOSPITAL_COMMUNITY): Payer: 59

## 2017-06-09 DIAGNOSIS — I130 Hypertensive heart and chronic kidney disease with heart failure and stage 1 through stage 4 chronic kidney disease, or unspecified chronic kidney disease: Secondary | ICD-10-CM | POA: Diagnosis not present

## 2017-06-09 DIAGNOSIS — I5031 Acute diastolic (congestive) heart failure: Secondary | ICD-10-CM | POA: Diagnosis not present

## 2017-06-09 DIAGNOSIS — J13 Pneumonia due to Streptococcus pneumoniae: Secondary | ICD-10-CM | POA: Diagnosis not present

## 2017-06-09 DIAGNOSIS — N179 Acute kidney failure, unspecified: Secondary | ICD-10-CM | POA: Diagnosis not present

## 2017-06-09 DIAGNOSIS — R6521 Severe sepsis with septic shock: Secondary | ICD-10-CM | POA: Diagnosis not present

## 2017-06-09 DIAGNOSIS — E440 Moderate protein-calorie malnutrition: Secondary | ICD-10-CM | POA: Diagnosis not present

## 2017-06-09 DIAGNOSIS — A403 Sepsis due to Streptococcus pneumoniae: Secondary | ICD-10-CM | POA: Diagnosis not present

## 2017-06-09 DIAGNOSIS — J440 Chronic obstructive pulmonary disease with acute lower respiratory infection: Secondary | ICD-10-CM | POA: Diagnosis not present

## 2017-06-09 DIAGNOSIS — J80 Acute respiratory distress syndrome: Secondary | ICD-10-CM | POA: Diagnosis not present

## 2017-06-09 LAB — CBC
HEMATOCRIT: 38.5 % — AB (ref 39.0–52.0)
HEMOGLOBIN: 12.3 g/dL — AB (ref 13.0–17.0)
MCH: 27.3 pg (ref 26.0–34.0)
MCHC: 31.9 g/dL (ref 30.0–36.0)
MCV: 85.6 fL (ref 78.0–100.0)
Platelets: 395 10*3/uL (ref 150–400)
RBC: 4.5 MIL/uL (ref 4.22–5.81)
RDW: 14 % (ref 11.5–15.5)
WBC: 24.8 10*3/uL — AB (ref 4.0–10.5)

## 2017-06-09 LAB — BASIC METABOLIC PANEL
ANION GAP: 10 (ref 5–15)
BUN: 32 mg/dL — AB (ref 6–20)
CHLORIDE: 95 mmol/L — AB (ref 101–111)
CO2: 30 mmol/L (ref 22–32)
Calcium: 8 mg/dL — ABNORMAL LOW (ref 8.9–10.3)
Creatinine, Ser: 0.85 mg/dL (ref 0.61–1.24)
Glucose, Bld: 122 mg/dL — ABNORMAL HIGH (ref 65–99)
POTASSIUM: 4.5 mmol/L (ref 3.5–5.1)
SODIUM: 135 mmol/L (ref 135–145)

## 2017-06-09 MED ORDER — FUROSEMIDE 10 MG/ML IJ SOLN
40.0000 mg | Freq: Two times a day (BID) | INTRAMUSCULAR | Status: AC
  Administered 2017-06-09 (×2): 40 mg via INTRAVENOUS
  Filled 2017-06-09 (×2): qty 4

## 2017-06-09 MED ORDER — PREDNISONE 20 MG PO TABS
40.0000 mg | ORAL_TABLET | Freq: Every day | ORAL | Status: DC
  Administered 2017-06-09 – 2017-06-13 (×5): 40 mg via ORAL
  Filled 2017-06-09 (×5): qty 2

## 2017-06-09 MED ORDER — POTASSIUM CHLORIDE CRYS ER 20 MEQ PO TBCR
40.0000 meq | EXTENDED_RELEASE_TABLET | Freq: Once | ORAL | Status: AC
  Administered 2017-06-09: 40 meq via ORAL
  Filled 2017-06-09: qty 2

## 2017-06-09 NOTE — Progress Notes (Signed)
[facility] Attending Note  ADMISSION DATE:05/27/2017  CONSULTATION DATE:05/27/2017  REFERRING MD:Dr. Patrecia Pour   CHIEF COMPLAINT:Acute Hypoxic Respiratory Failure  Presenting HPI:  66 y.o. male presenting with dyspnea, fever to 101.76F, chills, weakness, and acute hypoxic respiratory failure. Presumed to be secondary to community-acquired pneumonia versus acute exacerbation of underlying COPD. Patient has a history of essential hypertension and hepatitis C viral infection. Patient also has polysubstance abuse including cocaine.  Subjective:   Still reporting chest pain over the weekend. No other new changes.  Review of Systems:   Temp:  [97.5 F (36.4 C)-98 F (36.7 C)] 97.5 F (36.4 C) (10/01 0700) Pulse Rate:  [66-94] 78 (10/01 0602) Resp:  [10-18] 13 (10/01 0600) BP: (124-143)/(55-67) 143/67 (10/01 0602) SpO2:  [89 %-99 %] 99 % (10/01 0732) FiO2 (%):  [60 %-80 %] 75 % (10/01 0732)  Intake/Output Summary (Last 24 hours) at 06/09/17 0815 Last data filed at 06/08/17 2344  Gross per 24 hour  Intake               50 ml  Output             1400 ml  Net            -1350 ml   General exam: This is 66 year old male patient who is chronically ill-appearing. He is currently sitting upright in no acute distress. HENT: He is normocephalic atraumatic, his mucous membranes are moist, there is no jugular venous distention Pulmonary: No accessory muscle use, crackles in posterior, no wheezing, some occasional rhonchi after a cough. Cardiac: Regular rate and rhythm no murmur rub or gallop Extremities: Warm and dry, brisk cap refill, no edema Abdomen soft, non tender, no organomegaly, good bowel sounds. Neuro: Awake oriented no focal neurological deficits   CBC Latest Ref Rng & Units 06/08/2017 06/07/2017 06/06/2017  WBC 4.0 - 10.5 K/uL 20.7(H) 21.4(H) 29.7(H)  Hemoglobin 13.0 - 17.0 g/dL 10.2(L) 11.5(L) 11.1(L)  Hematocrit 39.0 - 52.0 % 32.4(L) 35.4(L)  34.2(L)  Platelets 150 - 400 K/uL 367 352 335   BMP Latest Ref Rng & Units 06/08/2017 06/07/2017 06/06/2017  Glucose 65 - 99 mg/dL 178(H) 112(H) 180(H)  BUN 6 - 20 mg/dL 31(H) 37(H) 39(H)  Creatinine 0.61 - 1.24 mg/dL 0.82 0.95 1.14  Sodium 135 - 145 mmol/L 134(L) 136 136  Potassium 3.5 - 5.1 mmol/L 3.7 3.8 3.6  Chloride 101 - 111 mmol/L 94(L) 92(L) 93(L)  CO2 22 - 32 mmol/L 32 33(H) 30  Calcium 8.9 - 10.3 mg/dL 7.9(L) 7.9(L) 7.9(L)   Recent Labs Lab 06/03/17 0710 06/04/17 0319  06/05/17 0313 06/06/17 0304 06/07/17 0717 06/08/17 0323  PROCALCITON 0.48 0.38  --   --   --   --   --   WBC 30.3*  --   < > 24.4* 29.7* 21.4* 20.7*  < > = values in this interval not displayed.    IMAGING/STUDIES: V/Q SCAN 9/18:  Very low probability for pulmonary embolism (0 - 9%). UDS 9/18:  Positive Cocaine & Opiates  CT CHEST W/O 9/18:  Personally reviewed by me. No pleural effusion appreciated. No pericardial effusion. Precarinal mediastinal lymph node and large. Suspect subcarinal lymph node enlargement as well. Patchy bilateral alveolar opacities that are groundglass in the mid to upper lung zones and more consolidation within the dependent bilateral lower lobes. TTE 9/22:  Mild concentric LVH with EF 60-65%. Normal regional wall motion. Grade 1 diastolic dysfunction. LA & RA normal in size. RV  normal in size and function. No aortic stenosis or regurgitation. Aortic root normal in size. Trivial mitral regurgitation without stenosis. No pulmonic stenosis or regurgitation. Mild tricuspid regurgitation. No pericardial effusion. PORT CXR 9/24:  Previously reviewed by me. Patchy bilateral alveolar opacities with some worsening in the right lower lung. Silhouetting of the right hemidiaphragm which is new compared with earlier chest x-ray imaging. PORT CXR 9/25:  Previously reviewed by me. No pleural effusion appreciated. Slight improvement in bilateral patchy alveolar opacities compared with x-ray imaging from  9/22. PORT CXR 9/28:  Personally reviewed by me. Questionable marginal improvement in bilateral alveolar opacities. No pleural effusion appreciated. No new focal consolidation appreciated.  MICROBIOLOGY: MRSA PCR 9/18:  Negative Respiratory Viral Panel PCR 9/18:  Rhinovirus Influenza A/B PCR 9/28:  Negative  Blood Cultures x2 9/18:  1/2 Positive Streptococcus pneumoniae  Blood Cultures x2 9/22:  Negative  Sputum Culture 9/23:  Normal Respiratory Flora   ANTIBIOTICS: Zosyn 9/18 - 9/19 Azithromycin 9/18 - 9/19 Rocephin 9/18 - 9/24 Cefepime 9/24 (x1 dose) Tressie Ellis 9/24 >>>9/26 Vancomycin 9/24 >>>9/26 Rocephin 9/26>>  ASSESSMENT/PLAN:   Acute hypoxic respiratory  Failure Pneumococcal pneumonia ARDS Chronic obstructive pulmonary disease Strep bacteremia Pleuritic chest pain Sepsis Deconditioning Chronic pain Leukocytosis   Acute hypoxic respiratory failure in the setting of pneumococcal pneumonia with bacteremia and resulting ARDS. Complicated by pleuritic chest pain, underlying chronic obstructive pulmonary disease, deconditioning, and chronic pain syndrome.  Discussion: He remains on high flow nasal cannula, over the weekend continues to request narcotics for acute on chronic pain. Chest x-ray personally reviewed: Diffuse bilateral pulmonary infiltrates right greater than left. There is significant improvement in aeration when comparing film from 9/20 04/10/2009/1 He has had no fever spikes. His white blood cell count is trending down. He is now 10 L negative since admit Plan: Complete ceftriaxone as guided by infectious disease Wean oxygen as tolerated for saturations greater than 88% Mobilize Continue incentive spirometry and flutter valve Continue scheduled bronchodilators Will change Solu-Medrol to prednisone 40 daily, we'll slowly wean this over 2 weeks Careful with narcotics Repeat Lasix once again, we need to continue to push this as long as his blood pressure,  creatinine, and BUN will allow. I suspect it will take some time for him to return to baseline, consider LTAC referral  Erick Colace ACNP-BC [place] Pager # [phone] OR # [phone] if no answer      8:15 AM 06/09/17

## 2017-06-09 NOTE — Progress Notes (Signed)
PROGRESS NOTE    Travis Salinas  TMH:[id number] DOB: 05/02/1951 DOA: 05/27/2017 PCP: Kathyrn Lass, MD   Brief Narrative: 66 y.o.malewith medical history significant of hypertension, COPD, depression/anxiety and BPH who presented to the emergency department complaining of progressive worsening shortness of breath for the past 4 days. Patient was seen by his PCP 4 days ago and was diagnosed with COPD exacerbation. She was placed on doxycycline, prednisone and cough syrup. The morning of admission breathing got acutely worse and he developed fevers and chills. CT scan showed multifocal pneumonia. Given profound hypoxia he was admitted to stepdown and PCCM was consulted. He initially had some improvement however over the last few days he became febrile again, and more hypoxic.    Assessment & Plan:   Principal Problem:   Pneumococcal pneumonia ([place]) Active Problems:   HTN (hypertension)   Chronic hepatitis C without hepatic coma (HCC)   Substance abuse ([place])   IVDU (intravenous drug user)   Pneumococcal bacteremia   Acute respiratory failure (HCC)   Normocytic anemia   COPD (chronic obstructive pulmonary disease) (HCC)   Sepsis in the setting of multifocal pneumonia due to Streptococcus pneumoniae -Blood cultures with gram-positive cocci, Streptococcus pneumoniae 1 out of 2 bottles. No routine 2D echo is required for Streptococcus pneumonia bacteremia, discussed with ID on 9/20.  -Respiratory virus panel sent, came back positive with rhinovirus. Supportive treatment. -Patient was febrile on admission, fever has resolved for a few days however starting 9/22 he started spiking again. He also has increased leukocytosis which continues to go up. Blood cultures were sent on 9/22; no growth to date.  -appreciate critical care consultation, patient's antibiotics were broadened to vancomycin and cefepime on 9/24 -Sputum cultures consistent with normal respiratory  flora. -Appreciate ID evaluation, antibiotics narrow to ceftriaxone.  -received 14 days of IV antibiotcs. Will discontinue IV ceftriaxone -  Acute hypoxic respiratory failure, developing ARDS -Due to #1 and #2 -Received IV Lasix on 9-24, chest x-ray 9-25 still with ARDS appearance however slightly improved -More hypoxic overnight. Was off oxygen. Now sat in the highs 87 % on 40  % HFNS.  -BNP at 67.  Lasix 40 mg BID>  2 dose today  Continue to wean oxygen as possible.  Negative 10 L.  Now on solumedrol 40 mg BID>  Change to oral prednisone.  Will ask CM for LTAC> referral.   Streptococcus Pneumoniae  Bacteremia;  On IV antibiotics.  Repeated blood culture 9-22 no growth to date.  WBC increase on solumedrol. Chest x ray improved.  WBC trending down.   COPD exacerbation -In the setting of #1, no further wheezing, continue duo nebs, change Solu-Medrol to prednisone -Critical care followed patient, signed off on 9/21, re-consulted 9/22 due to worsening respiratory status. -on IV solumedrol.   AKI on CKD 2 -Acute kidney injury resolved, creatinine is back to his baseline -monitor cr on lasix.   HTN -continue with atenolol. Hold Norvasc in event of worsening infection.   Depression/anxiety -Continue home medications  BPH -Continue home medications  Polysubstance abuse -UDS positive for cocaine.  SVT; check mg. Replete k. Continue with atenolol.    DVT prophylaxis: Lovenox.  Code Status: Full code.  Family Communication: care discussed with patient.  Disposition Plan: Remain in the step down. CM consult for LTAC>    Consultants:   CCM   Procedures:   none   Antimicrobials:   Vancomycin 9-24  Fortaz. 9-24   Subjective: He denies worsening dyspnea.  Objective: Vitals:   06/09/17 1147 06/09/17 1200 06/09/17 1329 06/09/17 1330  BP:      Pulse: 76     Resp: 19     Temp:  97.8 F (36.6 C)    TempSrc:  Oral    SpO2: 91%  95% 94%  Weight:       Height:        Intake/Output Summary (Last 24 hours) at 06/09/17 1413 Last data filed at 06/09/17 0800  Gross per 24 hour  Intake               50 ml  Output             1675 ml  Net            -1625 ml   Filed Weights   05/30/17 0345 05/31/17 0404 06/01/17 0339  Weight: 70.1 kg (154 lb 8.7 oz) 69.3 kg (152 lb 12.5 oz) 63 kg (138 lb 14.2 oz)    Examination:  General exam; NAD Respiratory system:  Bilateral air movement, crackles a t bases.  Cardiovascular system: S 1, S 2 RRR Gastrointestinal system: BS present, soft,.  Central nervous system: alert and oriented.  Extremities: no edema Skin: no rash    Data Reviewed: I have personally reviewed following labs and imaging studies  CBC:  Recent Labs Lab 06/05/17 0313 06/06/17 0304 06/07/17 0717 06/08/17 0323 06/09/17 1102  WBC 24.4* 29.7* 21.4* 20.7* 24.8*  HGB 12.0* 11.1* 11.5* 10.2* 12.3*  HCT 36.3* 34.2* 35.4* 32.4* 38.5*  MCV 83.4 84.4 84.9 85.3 85.6  PLT 237 335 352 367 338   Basic Metabolic Panel:  Recent Labs Lab 06/03/17 0710  06/05/17 0313 06/05/17 0823 06/06/17 0304 06/07/17 0332 06/08/17 0323 06/09/17 0800  NA 136  < > 137  --  136 136 134* 135  K 3.3*  < > 3.6  --  3.6 3.8 3.7 4.5  CL 98*  < > 93*  --  93* 92* 94* 95*  CO2 26  < > 30  --  30 33* 32 30  GLUCOSE 124*  < > 138*  --  180* 112* 178* 122*  BUN 25*  < > 30*  --  39* 37* 31* 32*  CREATININE 1.07  < > 1.12  --  1.14 0.95 0.82 0.85  CALCIUM 7.9*  < > 8.1*  --  7.9* 7.9* 7.9* 8.0*  MG 1.9  --   --  2.1  --   --   --   --   PHOS  --   --   --   --   --  2.9  --   --   < > = values in this interval not displayed. GFR: Estimated Creatinine Clearance: 77.2 mL/min (by C-G formula based on SCr of 0.85 mg/dL). Liver Function Tests:  Recent Labs Lab 06/03/17 0710 06/07/17 0332  AST 53*  --   ALT 20  --   ALKPHOS 103  --   BILITOT 0.7  --   PROT 6.4*  --   ALBUMIN 2.4* 2.2*   No results for input(s): LIPASE, AMYLASE in the  last 168 hours. No results for input(s): AMMONIA in the last 168 hours. Coagulation Profile: No results for input(s): INR, PROTIME in the last 168 hours. Cardiac Enzymes: No results for input(s): CKTOTAL, CKMB, CKMBINDEX, TROPONINI in the last 168 hours. BNP (last 3 results) No results for input(s): PROBNP in the last 8760 hours. HbA1C: No results for input(s):  HGBA1C in the last 72 hours. CBG: No results for input(s): GLUCAP in the last 168 hours. Lipid Profile: No results for input(s): CHOL, HDL, LDLCALC, TRIG, CHOLHDL, LDLDIRECT in the last 72 hours. Thyroid Function Tests: No results for input(s): TSH, T4TOTAL, FREET4, T3FREE, THYROIDAB in the last 72 hours. Anemia Panel: No results for input(s): VITAMINB12, FOLATE, FERRITIN, TIBC, IRON, RETICCTPCT in the last 72 hours. Sepsis Labs:  Recent Labs Lab 06/03/17 0710 06/04/17 0319  PROCALCITON 0.48 0.38    Recent Results (from the past 240 hour(s))  Culture, blood (Routine X 2) w Reflex to ID Panel     Status: None   Collection Time: 05/31/17  1:20 PM  Result Value Ref Range Status   Specimen Description BLOOD BLOOD RIGHT HAND  Final   Special Requests   Final    BOTTLES DRAWN AEROBIC ONLY Blood Culture adequate volume   Culture   Final    NO GROWTH 5 DAYS Performed at [facility], [place] [address] [place], [place] [address]    Report Status 06/05/2017 FINAL  Final  Culture, blood (Routine X 2) w Reflex to ID Panel     Status: None   Collection Time: 05/31/17  1:26 PM  Result Value Ref Range Status   Specimen Description BLOOD BLOOD LEFT HAND  Final   Special Requests IN PEDIATRIC BOTTLE Blood Culture adequate volume  Final   Culture   Final    NO GROWTH 5 DAYS Performed at [facility], [place] [address] [place], [place] [address]    Report Status 06/05/2017 FINAL  Final  Culture, expectorated sputum-assessment     Status: None   Collection Time: 06/01/17 11:12 AM  Result Value Ref Range Status    Specimen Description SPUTUM  Final   Special Requests NONE  Final   Sputum evaluation THIS SPECIMEN IS ACCEPTABLE FOR SPUTUM CULTURE  Final   Report Status 06/01/2017 FINAL  Final  Culture, respiratory (NON-Expectorated)     Status: None   Collection Time: 06/01/17 11:12 AM  Result Value Ref Range Status   Specimen Description SPUTUM  Final   Special Requests NONE Reflexed from [id number]  Final   Gram Stain   Final    NO WBC SEEN RARE SQUAMOUS EPITHELIAL CELLS PRESENT FEW GRAM POSITIVE COCCI IN CLUSTERS    Culture   Final    Consistent with normal respiratory flora. Performed at [facility], [place] [address], [place], [place] [address]    Report Status 06/03/2017 FINAL  Final         Radiology Studies: Dg Chest Port 1 View  Result Date: 06/09/2017 CLINICAL DATA:  66 year old male with hypoxia. Subsequent encounter. EXAM: PORTABLE CHEST 1 VIEW COMPARISON:  06/06/2017 chest x-ray. FINDINGS: Diffuse slightly asymmetric airspace disease may represent multifocal pneumonia superimposed upon chronic lung changes (asymmetric pulmonary edema less likely consideration). Overall appearance relatively similar to most recent examination. Skin fold rather than pneumothorax noted. Heart size within normal limits. Slightly calcified aorta. IMPRESSION: Similar appearance of diffuse airspace disease superimposed upon chronic lung changes. Electronically Signed   By: Genia Del M.D.   On: 06/09/2017 08:57        Scheduled Meds: . ARIPiprazole  20 mg Oral Daily  . aspirin EC  81 mg Oral Daily  . atenolol  25 mg Oral BH-q7a  . chlorhexidine  15 mL Mouth Rinse BID  . clonazePAM  0.5 mg Oral BID  . dextromethorphan-guaiFENesin  1 tablet Oral BID  .  enoxaparin (LOVENOX) injection  40 mg Subcutaneous Q24H  . feeding supplement (ENSURE ENLIVE)  237 mL Oral BID BM  . FLUoxetine  20 mg Oral Daily  . furosemide  40 mg Intravenous Q12H  . ibuprofen  600 mg Oral QID  . ipratropium-albuterol  3  mL Nebulization TID  . lamoTRIgine  200 mg Oral q morning - 10a  . mouth rinse  15 mL Mouth Rinse q12n4p  . pantoprazole  40 mg Oral Daily  . predniSONE  40 mg Oral Q breakfast  . tamsulosin  0.4 mg Oral Daily   Continuous Infusions: . sodium chloride Stopped (06/07/17 0910)  . cefTRIAXone (ROCEPHIN)  IV Stopped (06/08/17 2210)     LOS: 13 days    Time spent: 35 minutes.     Elmarie Shiley, MD [facility] Pager [phone]  If 7PM-7AM, please contact night-coverage [email or web address] Password [email or web address] 06/09/2017, 2:13 PM

## 2017-06-09 NOTE — Progress Notes (Signed)
Nutrition Follow-up  DOCUMENTATION CODES:   Non-severe (moderate) malnutrition in context of social or environmental circumstances  INTERVENTION:  - Continue Ensure Enlive BID. - Continue to encourage PO intakes of meals and supplements. - Will order for new weight today. - RD will monitor for additional nutrition-related needs.   NUTRITION DIAGNOSIS:   Malnutrition (Moderate) related to social / environmental circumstances (polysubstance abuse) as evidenced by energy intake < or equal to 50% for > or equal to 5 days. -ongoing  GOAL:   Patient will meet greater than or equal to 90% of their needs -likely unmet at this time.   MONITOR:   PO intake, Supplement acceptance, Weight trends, Labs, Skin  ASSESSMENT:   Pt with PMH of HTN, COPD, polysubstance abuse, depression, BPH. Presents this admission with severe sepsis secondary to acute respiratory failure due to HCAP.   10/1 Pt ate 50% of dinner on 9/27 and no other intakes documented since previous assessment. Pt states that he has not yet eaten breakfast but plans to shortly. RN, who is at bedside, states that pt drank Ensure this AM; pt confirms this. He states he has enjoyed Ensure (although review of order indicates he has only accepted it ~25% of the time) and he feels that his appetite has continued to slowly improve. RD talked with pt about the importance of adequate nutrition in order to regain strength and pt states understanding and that he will continue to try to eat well. No new weight since 9/23. Per rounds and notes, plan for consult for LTAC.   Medications reviewed; 40 mg IV Lasix BID, 40 mg oral Protonix/day, 40 mEq oral KCl x1 dose today, 40 mg Deltasone/day. Labs reviewed; Na: 134 mmol/L, Cl: 94 mmol/L, BUN: 31 mg/dL, Ca: 7.9 mg/dL.   9/24 - Diet was advanced from NPO to Regular on 9/20.  - No intakes documented since diet advancement that day.  - Wife at bedside and was setting pt up for breakfast (bacon,  scrambled eggs, and grits).  - Pt fairly drowsy and nodding off during visit.  - Wife reports that intakes have improved since pt has transitioned from NRB mask to HFNC.  - When he had NRB he would be able to remove mask for one bite and then have to quickly replace it between bites.  - He has been drinking Ensure Enlive ~50% of the time that he is provided with it.  - Per chart review, pt has lost 11 lbs/5 kg since admission.     9/19 - Spoke with pt at bedside who is currently on CPAP.  - Will try to obtain more information once CPAP d/c.  - Reports having decrease in appetite for two weeks related to unknown etiology.  - Pt typically consumes 1-2 meals per day, but could not elaborate.  - Pt consuming 3 Ensures daily at home.  - Records show pt has maintained a stable wt of 145-150 lb for over a year. - Reports a UBW of 140-142 lb during his life span.  - Nutrition-Focused physical exam completed.  - Findings are nofat depletion, moderatemuscle depletion, and noedema.  - Suspect pt has some form of malnutrition given his intake recall and muscle depletions. - RD to monitor for diet advancement and provide supplementation this hospital stay.     Diet Order:  Diet regular Room service appropriate? Yes; Fluid consistency: Thin  Skin:  Wound (see comment) (L leg full thickness wound)  Last BM:  9/29  Height:   Ht  Readings from Last 1 Encounters:  05/28/17 6' (1.829 m)    Weight:   Wt Readings from Last 1 Encounters:  06/01/17 138 lb 14.2 oz (63 kg)    Ideal Body Weight:  80.9 kg  BMI:  Body mass index is 18.84 kg/m.  Estimated Nutritional Needs:   Kcal:  1900-2100 (28-31 kcal/kg)  Protein:  105-115 grams (1.5-1.7 g/kg)  Fluid:  >1.9 L/day  EDUCATION NEEDS:   No education needs identified at this time     Jarome Matin, MS, RD, LDN, CNSC Inpatient Clinical Dietitian Pager # [phone] After hours/weekend pager # [phone]

## 2017-06-09 NOTE — Care Management Note (Signed)
Case Management Note  Patient Details  Name: Almond Fitzgibbon MRN: [id number] Date of Birth: [birth year]  Subjective/Objective:                  Remains on 60-80% hfnc  Action/Plan: Date:  June 09, 2017 Chart reviewed for concurrent status and case management needs.  Will continue to follow patient progress.  Discharge Planning: following for needs  Expected discharge date: June 12, 2017  Velva Harman, BSN, [place], [place]   Expected Discharge Date:   (unknown)               Expected Discharge Plan:  Home/Self Care  In-House Referral:  Clinical Social Work  Discharge planning Services  CM Consult  Post Acute Care Choice:    Choice offered to:     DME Arranged:    DME Agency:     HH Arranged:    [place] Agency:     Status of Service:  In process, will continue to follow  If discussed at Long Length of Stay Meetings, dates discussed:    Additional Comments:  Leeroy Cha, RN 06/09/2017, 9:23 AM

## 2017-06-10 LAB — BASIC METABOLIC PANEL
Anion gap: 9 (ref 5–15)
BUN: 36 mg/dL — ABNORMAL HIGH (ref 6–20)
CO2: 31 mmol/L (ref 22–32)
Calcium: 8.2 mg/dL — ABNORMAL LOW (ref 8.9–10.3)
Chloride: 97 mmol/L — ABNORMAL LOW (ref 101–111)
Creatinine, Ser: 0.93 mg/dL (ref 0.61–1.24)
GFR calc Af Amer: >60 mL/min (ref >60)
Glucose, Bld: 121 mg/dL — ABNORMAL HIGH (ref 65–99)
POTASSIUM: 3.7 mmol/L (ref 3.5–5.1)
SODIUM: 137 mmol/L (ref 135–145)

## 2017-06-10 LAB — CBC
HCT: 35.7 % — ABNORMAL LOW (ref 39.0–52.0)
Hemoglobin: 11.3 g/dL — ABNORMAL LOW (ref 13.0–17.0)
MCH: 27 pg (ref 26.0–34.0)
MCHC: 31.7 g/dL (ref 30.0–36.0)
MCV: 85.4 fL (ref 78.0–100.0)
PLATELETS: 393 10*3/uL (ref 150–400)
RBC: 4.18 MIL/uL — AB (ref 4.22–5.81)
RDW: 14 % (ref 11.5–15.5)
WBC: 24.6 10*3/uL — AB (ref 4.0–10.5)

## 2017-06-10 MED ORDER — FUROSEMIDE 10 MG/ML IJ SOLN
40.0000 mg | Freq: Two times a day (BID) | INTRAMUSCULAR | Status: AC
  Administered 2017-06-10 – 2017-06-11 (×2): 40 mg via INTRAVENOUS
  Filled 2017-06-10 (×2): qty 4

## 2017-06-10 MED ORDER — SALINE SPRAY 0.65 % NA SOLN
1.0000 | NASAL | Status: DC | PRN
  Filled 2017-06-10: qty 44

## 2017-06-10 NOTE — Progress Notes (Addendum)
PROGRESS NOTE    Travis Salinas  WCH:[id number] DOB: September 21, 1950 DOA: 05/27/2017 PCP: Kathyrn Lass, MD   Brief Narrative: 66 y.o.malewith medical history significant of hypertension, COPD, depression/anxiety and BPH who presented to the emergency department complaining of progressive worsening shortness of breath for the past 4 days. Patient was seen by his PCP 4 days ago and was diagnosed with COPD exacerbation. She was placed on doxycycline, prednisone and cough syrup. The morning of admission breathing got acutely worse and he developed fevers and chills. CT scan showed multifocal pneumonia. Given profound hypoxia he was admitted to stepdown and PCCM was consulted. He initially had some improvement however over the last few days he became febrile again, and more hypoxic.   Patient admitted with Acute hypoxic respiratory failure, secondary to streptococcus Pneumoniae. He has been getting IV antibiotics. ID recommended 14 day of IV antibiotics. He received IV solumedrol, now on prednisone. IV lasix. Patient slowly improving. He is still requiring high flow oxygen./ he will benefit from LTAC>   Assessment & Plan:   Principal Problem:   Pneumococcal pneumonia ([place]) Active Problems:   HTN (hypertension)   Chronic hepatitis C without hepatic coma (HCC)   Substance abuse ([place])   IVDU (intravenous drug user)   Pneumococcal bacteremia   Acute respiratory failure (HCC)   Normocytic anemia   COPD (chronic obstructive pulmonary disease) (HCC)   Sepsis in the setting of multifocal pneumonia due to Streptococcus pneumoniae -Blood cultures with gram-positive cocci, Streptococcus pneumoniae 1 out of 2 bottles. No routine 2D echo is required for Streptococcus pneumonia bacteremia, discussed with ID on 9/20.  -Respiratory virus panel sent, came back positive with rhinovirus. Supportive treatment. -Patient was febrile on admission, fever has resolved for a few days however starting 9/22 he  started spiking again. He also has increased leukocytosis which continues to go up. Blood cultures were sent on 9/22; no growth to date.  -appreciate critical care consultation, patient's antibiotics were broadened to vancomycin and cefepime on 9/24 -Sputum cultures consistent with normal respiratory flora. -Appreciate ID evaluation, antibiotics narrow to ceftriaxone.  -needs 14 days of IV antibiotics. Today is day 14.     Acute hypoxic respiratory failure, developing ARDS -Due to #1 and #2 -Received IV Lasix on 9-24, chest x-ray 9-25 still with ARDS appearance however slightly improved -More hypoxic overnight. Was off oxygen. Now sat in the highs 87 % on 40  % HFNS.  -BNP at 67.  Lasix 40 mg BID>  2 dose today  Continue to wean oxygen as possible.  Negative 10 L.  solumedrol 40 mg BID>  Change to oral prednisone. Continue taper.  Will ask CM for LTAC> referral.   Streptococcus Pneumoniae  Bacteremia;  Repeated blood culture 9-22 no growth to date.  WBC increase on solumedrol. Chest x ray improved.  WBC trending down.  On IV antibiotics. Day 14 today   COPD exacerbation -In the setting of #1, no further wheezing, continue duo nebs, change Solu-Medrol to prednisone -Critical care followed patient, signed off on 9/21, re-consulted 9/22 due to worsening respiratory status. -on nebulizer and prednisone.   AKI on CKD 2 -Acute kidney injury resolved, creatinine is back to his baseline -monitor cr on lasix.   HTN -continue with atenolol. Hold Norvasc in event of worsening infection.   Depression/anxiety -Continue home medications  BPH -Continue home medications  Polysubstance abuse -UDS positive for cocaine.  SVT; check mg. Replete k. Continue with atenolol.    DVT prophylaxis: Lovenox.  Code Status: Full code.  Family Communication: care discussed with patient.  Disposition Plan: Remain in the step down. CM consult for LTAC>    Consultants:    CCM   Procedures:   none   Antimicrobials:   Vancomycin 9-24  Fortaz. 9-24   Subjective: He denies worsening dyspnea.  No worsening cough   Objective: Vitals:   06/10/17 1000 06/10/17 1120 06/10/17 1200 06/10/17 1320  BP: (!) 146/64  (!) 146/89   Pulse: 92  90   Resp: 14  18   Temp:  97.8 F (36.6 C)    TempSrc:  Oral    SpO2: 95%  92% 96%  Weight:      Height:        Intake/Output Summary (Last 24 hours) at 06/10/17 1403 Last data filed at 06/10/17 1200  Gross per 24 hour  Intake              890 ml  Output              975 ml  Net              -85 ml   Filed Weights   05/31/17 0404 06/01/17 0339 06/09/17 1700  Weight: 69.3 kg (152 lb 12.5 oz) 63 kg (138 lb 14.2 oz) 55.1 kg (121 lb 7.6 oz)    Examination:  General exam; NAD, chronic ill appearing.  Respiratory system:  Bilateral air movement, bilateral crackles.  Cardiovascular system: S 1, S 2 RRR Gastrointestinal system: BS present, soft, nt Central nervous system: non focal.  Extremities: no edema Skin: no rash    Data Reviewed: I have personally reviewed following labs and imaging studies  CBC:  Recent Labs Lab 06/06/17 0304 06/07/17 0717 06/08/17 0323 06/09/17 1102 06/10/17 0351  WBC 29.7* 21.4* 20.7* 24.8* 24.6*  HGB 11.1* 11.5* 10.2* 12.3* 11.3*  HCT 34.2* 35.4* 32.4* 38.5* 35.7*  MCV 84.4 84.9 85.3 85.6 85.4  PLT 335 352 367 395 409   Basic Metabolic Panel:  Recent Labs Lab 06/05/17 0823 06/06/17 0304 06/07/17 0332 06/08/17 0323 06/09/17 0800 06/10/17 0351  NA  --  136 136 134* 135 137  K  --  3.6 3.8 3.7 4.5 3.7  CL  --  93* 92* 94* 95* 97*  CO2  --  30 33* 32 30 31  GLUCOSE  --  180* 112* 178* 122* 121*  BUN  --  39* 37* 31* 32* 36*  CREATININE  --  1.14 0.95 0.82 0.85 0.93  CALCIUM  --  7.9* 7.9* 7.9* 8.0* 8.2*  MG 2.1  --   --   --   --   --   PHOS  --   --  2.9  --   --   --    GFR: Estimated Creatinine Clearance: 61.7 mL/min (by C-G formula based on SCr  of 0.93 mg/dL). Liver Function Tests:  Recent Labs Lab 06/07/17 0332  ALBUMIN 2.2*   No results for input(s): LIPASE, AMYLASE in the last 168 hours. No results for input(s): AMMONIA in the last 168 hours. Coagulation Profile: No results for input(s): INR, PROTIME in the last 168 hours. Cardiac Enzymes: No results for input(s): CKTOTAL, CKMB, CKMBINDEX, TROPONINI in the last 168 hours. BNP (last 3 results) No results for input(s): PROBNP in the last 8760 hours. HbA1C: No results for input(s): HGBA1C in the last 72 hours. CBG: No results for input(s): GLUCAP in the last 168 hours. Lipid Profile: No results for  input(s): CHOL, HDL, LDLCALC, TRIG, CHOLHDL, LDLDIRECT in the last 72 hours. Thyroid Function Tests: No results for input(s): TSH, T4TOTAL, FREET4, T3FREE, THYROIDAB in the last 72 hours. Anemia Panel: No results for input(s): VITAMINB12, FOLATE, FERRITIN, TIBC, IRON, RETICCTPCT in the last 72 hours. Sepsis Labs:  Recent Labs Lab 06/04/17 0319  PROCALCITON 0.38    Recent Results (from the past 240 hour(s))  Culture, expectorated sputum-assessment     Status: None   Collection Time: 06/01/17 11:12 AM  Result Value Ref Range Status   Specimen Description SPUTUM  Final   Special Requests NONE  Final   Sputum evaluation THIS SPECIMEN IS ACCEPTABLE FOR SPUTUM CULTURE  Final   Report Status 06/01/2017 FINAL  Final  Culture, respiratory (NON-Expectorated)     Status: None   Collection Time: 06/01/17 11:12 AM  Result Value Ref Range Status   Specimen Description SPUTUM  Final   Special Requests NONE Reflexed from [id number]  Final   Gram Stain   Final    NO WBC SEEN RARE SQUAMOUS EPITHELIAL CELLS PRESENT FEW GRAM POSITIVE COCCI IN CLUSTERS    Culture   Final    Consistent with normal respiratory flora. Performed at [facility], [place] [address], [place], [place] [address]    Report Status 06/03/2017 FINAL  Final         Radiology Studies: Dg Chest Port 1  View  Result Date: 06/09/2017 CLINICAL DATA:  66 year old male with hypoxia. Subsequent encounter. EXAM: PORTABLE CHEST 1 VIEW COMPARISON:  06/06/2017 chest x-ray. FINDINGS: Diffuse slightly asymmetric airspace disease may represent multifocal pneumonia superimposed upon chronic lung changes (asymmetric pulmonary edema less likely consideration). Overall appearance relatively similar to most recent examination. Skin fold rather than pneumothorax noted. Heart size within normal limits. Slightly calcified aorta. IMPRESSION: Similar appearance of diffuse airspace disease superimposed upon chronic lung changes. Electronically Signed   By: Genia Del M.D.   On: 06/09/2017 08:57        Scheduled Meds: . ARIPiprazole  20 mg Oral Daily  . aspirin EC  81 mg Oral Daily  . atenolol  25 mg Oral BH-q7a  . chlorhexidine  15 mL Mouth Rinse BID  . clonazePAM  0.5 mg Oral BID  . dextromethorphan-guaiFENesin  1 tablet Oral BID  . enoxaparin (LOVENOX) injection  40 mg Subcutaneous Q24H  . feeding supplement (ENSURE ENLIVE)  237 mL Oral BID BM  . FLUoxetine  20 mg Oral Daily  . furosemide  40 mg Intravenous Q12H  . ibuprofen  600 mg Oral QID  . ipratropium-albuterol  3 mL Nebulization TID  . lamoTRIgine  200 mg Oral q morning - 10a  . mouth rinse  15 mL Mouth Rinse q12n4p  . pantoprazole  40 mg Oral Daily  . predniSONE  40 mg Oral Q breakfast  . tamsulosin  0.4 mg Oral Daily   Continuous Infusions: . sodium chloride Stopped (06/07/17 0910)  . cefTRIAXone (ROCEPHIN)  IV 2 g (06/09/17 2130)     LOS: 14 days    Time spent: 35 minutes.     Elmarie Shiley, MD [facility] Pager [phone]  If 7PM-7AM, please contact night-coverage [email or web address] Password [email or web address] 06/10/2017, 2:03 PM

## 2017-06-10 NOTE — Progress Notes (Signed)
Called to room for pt. Who has saturations of 87-88% on 60% fio2.  Pt. Complains that his nares are crusty and he is having difficulty getting flow via [place] because of that.  Asked RN is she could obtain nasal spray for pt.  Fio2 increased to 70% at this time.

## 2017-06-10 NOTE — Progress Notes (Signed)
RT placed the Pt on 12L [place] HF

## 2017-06-10 NOTE — Consult Note (Signed)
   [facility] CM Inpatient Consult   06/10/2017  Keefer Soulliere [birth year] [phone]   Following for potential [facility] Care Management/Link to Wellness needs on behalf of [place] employees/dependents with [facility] insurance.   Member remains in stepdown.   Will engage at more appropriate time.   [name], MSN-Ed, RN,BSN [facility] Liaison [phone]

## 2017-06-11 DIAGNOSIS — D649 Anemia, unspecified: Secondary | ICD-10-CM

## 2017-06-11 DIAGNOSIS — I10 Essential (primary) hypertension: Secondary | ICD-10-CM

## 2017-06-11 MED ORDER — POTASSIUM CHLORIDE CRYS ER 20 MEQ PO TBCR
40.0000 meq | EXTENDED_RELEASE_TABLET | Freq: Once | ORAL | Status: AC
  Administered 2017-06-11: 40 meq via ORAL
  Filled 2017-06-11: qty 2

## 2017-06-11 NOTE — Progress Notes (Signed)
06/11/2017/Rhonda Davis,BSN,RN3,CCM: [phone]/referral to both ltachs in the area done.

## 2017-06-11 NOTE — Progress Notes (Signed)
[facility] PROGRESS NOTE  Travis Salinas OIN:[id number] DOB: 12-Jun-1951 DOA: 05/27/2017  PCP: Kathyrn Lass, MD  Brief History/Interval Summary: 66 year old Caucasian male with a past medical history of COPD, hypertension, depression, anxiety, presented with progressively worsening shortness of breath. Patient was diagnosed with the pneumonia. He was hospitalized. Patient was given IV antibiotics. He was seen by pulmonology and by infectious disease due to slow improvement. Remains on high flow oxygen by nasal cannula.  Reason for Visit: Acute respiratory failure with hypoxia  Consultants: Pulmonology. Infectious disease  Procedures:  Transthoracic echocardiogram Study Conclusions  - Left ventricle: The cavity size was normal. There was mild   concentric hypertrophy. Systolic function was normal. The   estimated ejection fraction was in the range of 60% to 65%. Wall   motion was normal; there were no regional wall motion   abnormalities. Doppler parameters are consistent with abnormal   left ventricular relaxation (grade 1 diastolic dysfunction). - Aortic valve: Transvalvular velocity was within the normal range.   There was no stenosis. There was no regurgitation. - Mitral valve: Transvalvular velocity was within the normal range.   There was no evidence for stenosis. There was trivial   regurgitation. - Right ventricle: The cavity size was normal. Wall thickness was   normal. Systolic function was normal. - Tricuspid valve: There was mild regurgitation. - Pulmonary arteries: Systolic pressure was within the normal   range. PA peak pressure: 32 mm Hg (S).   Antibiotics: Patient completed a course of ceftriaxone on 10/2  Subjective/Interval History: Patient states that he is feeling well. Still with cough which is dry for the most part. Denies any chest pain. States that his oxygen level drops even with minimal exertion.  ROS: Denies any nausea or  vomiting  Objective:  Vital Signs  Vitals:   06/11/17 0818 06/11/17 0900 06/11/17 1000 06/11/17 1130  BP:  113/70 119/68   Pulse:  84 79 88  Resp:  17 14 15   Temp:      TempSrc:      SpO2: 92% (!) 88% (!) 83% (!) 80%  Weight:      Height:        Intake/Output Summary (Last 24 hours) at 06/11/17 1208 Last data filed at 06/11/17 1000  Gross per 24 hour  Intake              710 ml  Output             1075 ml  Net             -365 ml   Filed Weights   05/31/17 0404 06/01/17 0339 06/09/17 1700  Weight: 69.3 kg (152 lb 12.5 oz) 63 kg (138 lb 14.2 oz) 55.1 kg (121 lb 7.6 oz)    General appearance: alert, cooperative, appears stated age and no distress Resp: Coarse breath bilaterally with crackles both bases. No wheezing. No rhonchi. Cardio: regular rate and rhythm, S1, S2 normal, no murmur, click, rub or gallop GI: soft, non-tender; bowel sounds normal; no masses,  no organomegaly Extremities: extremities normal, atraumatic, no cyanosis or edema Neurologic: No focal neurological deficits  Lab Results:  Data Reviewed: I have personally reviewed following labs and imaging studies  CBC:  Recent Labs Lab 06/06/17 0304 06/07/17 0717 06/08/17 0323 06/09/17 1102 06/10/17 0351  WBC 29.7* 21.4* 20.7* 24.8* 24.6*  HGB 11.1* 11.5* 10.2* 12.3* 11.3*  HCT 34.2* 35.4* 32.4* 38.5* 35.7*  MCV 84.4 84.9 85.3 85.6 85.4  PLT  335 352 367 395 237    Basic Metabolic Panel:  Recent Labs Lab 06/05/17 0823 06/06/17 0304 06/07/17 0332 06/08/17 0323 06/09/17 0800 06/10/17 0351  NA  --  136 136 134* 135 137  K  --  3.6 3.8 3.7 4.5 3.7  CL  --  93* 92* 94* 95* 97*  CO2  --  30 33* 32 30 31  GLUCOSE  --  180* 112* 178* 122* 121*  BUN  --  39* 37* 31* 32* 36*  CREATININE  --  1.14 0.95 0.82 0.85 0.93  CALCIUM  --  7.9* 7.9* 7.9* 8.0* 8.2*  MG 2.1  --   --   --   --   --   PHOS  --   --  2.9  --   --   --     GFR: Estimated Creatinine Clearance: 61.7 mL/min (by C-G formula  based on SCr of 0.93 mg/dL).  Liver Function Tests:  Recent Labs Lab 06/07/17 0332  ALBUMIN 2.2*     Radiology Studies: No results found.   Medications:  Scheduled: . ARIPiprazole  20 mg Oral Daily  . aspirin EC  81 mg Oral Daily  . atenolol  25 mg Oral BH-q7a  . chlorhexidine  15 mL Mouth Rinse BID  . clonazePAM  0.5 mg Oral BID  . dextromethorphan-guaiFENesin  1 tablet Oral BID  . enoxaparin (LOVENOX) injection  40 mg Subcutaneous Q24H  . feeding supplement (ENSURE ENLIVE)  237 mL Oral BID BM  . FLUoxetine  20 mg Oral Daily  . ibuprofen  600 mg Oral QID  . ipratropium-albuterol  3 mL Nebulization TID  . lamoTRIgine  200 mg Oral q morning - 10a  . mouth rinse  15 mL Mouth Rinse q12n4p  . pantoprazole  40 mg Oral Daily  . predniSONE  40 mg Oral Q breakfast  . tamsulosin  0.4 mg Oral Daily   Continuous: . sodium chloride 10 mL/hr at 06/11/17 1200   SEG:BTDVVO chloride, acetaminophen, albuterol, benzonatate, bisacodyl, fentaNYL (SUBLIMAZE) injection, haloperidol lactate, LORazepam, ondansetron **OR** ondansetron (ZOFRAN) IV, oxyCODONE-acetaminophen, phenol, sodium chloride  Assessment/Plan:  Principal Problem:   Pneumococcal pneumonia ([place]) Active Problems:   HTN (hypertension)   Chronic hepatitis C without hepatic coma (HCC)   Substance abuse ([place])   IVDU (intravenous drug user)   Pneumococcal bacteremia   Acute respiratory failure (HCC)   Normocytic anemia   COPD (chronic obstructive pulmonary disease) (HCC)    Sepsis in the setting of multifocal pneumonia due to strep pneumonia Patient had positive blood cultures. Respiratory viral panel positive for rhinovirus. Patient was initially treated with IV antibiotics. Patient was very slow to improve. His antibiotic coverage had to be broadened to include vancomycin and cefepime. Sputum cultures consistent with normal respiratory flora. Patient subsequently seen by infectious disease and antibiotics narrowed down to  ceftriaxone. Patient has completed 14 days of antibiotics and antibiotic has been discontinued. Patient, however, continues to require high flow oxygen by nasal cannula.  Acute respiratory failure with hypoxia. This is most likely secondary to pneumonia and perhaps a component of diastolic CHF as well. Patient was started on IV diuretics, which is being continued. 2-D echocardiogram report reviewed. Patient also was started on Solu-Medrol and now is on oral prednisone. Due to persistent need for high flow oxygen. Patient to be considered for long-term acute care. Pulmonology had been following.  Strep pneumonia bacteremia Blood cultures repeated with no growth. WBC remains elevated, most likely due to  steroids. He remains afebrile.  Acute COPD exacerbation. Secondary to acute infection. No wheezing is heard. Continue current treatment.  Acute kidney injury on chronic kidney disease, stage II. Acute kidney injury has resolved. Creatinine is back to baseline. Continue to monitor.  Essential hypertension Continue with atenolol. Blood pressure is reasonably well controlled.  History of depression and anxiety. Continue with home medications.  History of BPH Continuing home medications.  Paroxysmal SVT Resolved with beta blocker. Electrolytes were checked and repleted.  History of polysubstance abuse. Urine drug screen positive for cocaine. Counseled.  Normocytic anemia. Hemoglobin has been stable. No evidence for overt bleeding. Continue to monitor.   DVT Prophylaxis: Lovenox    Code Status: Full code  Family Communication: Discussed with the patient  Disposition Plan: Management as outlined above.    LOS: 15 days   [facility] Pager [phone] 06/11/2017, 12:08 PM  If 7PM-7AM, please contact night-coverage at [email or web address], password [facility]

## 2017-06-11 NOTE — Progress Notes (Signed)
Sterile water added to [place].

## 2017-06-12 ENCOUNTER — Inpatient Hospital Stay (HOSPITAL_COMMUNITY): Payer: 59

## 2017-06-12 LAB — BASIC METABOLIC PANEL
ANION GAP: 7 (ref 5–15)
BUN: 28 mg/dL — ABNORMAL HIGH (ref 6–20)
CHLORIDE: 100 mmol/L — AB (ref 101–111)
CO2: 32 mmol/L (ref 22–32)
Calcium: 7.9 mg/dL — ABNORMAL LOW (ref 8.9–10.3)
Creatinine, Ser: 0.85 mg/dL (ref 0.61–1.24)
GFR calc Af Amer: >60 mL/min (ref >60)
GLUCOSE: 120 mg/dL — AB (ref 65–99)
POTASSIUM: 4.1 mmol/L (ref 3.5–5.1)
SODIUM: 139 mmol/L (ref 135–145)

## 2017-06-12 LAB — CBC
HCT: 33.5 % — ABNORMAL LOW (ref 39.0–52.0)
HEMOGLOBIN: 10.3 g/dL — AB (ref 13.0–17.0)
MCH: 26.8 pg (ref 26.0–34.0)
MCHC: 30.7 g/dL (ref 30.0–36.0)
MCV: 87 fL (ref 78.0–100.0)
PLATELETS: 286 10*3/uL (ref 150–400)
RBC: 3.85 MIL/uL — AB (ref 4.22–5.81)
RDW: 14.2 % (ref 11.5–15.5)
WBC: 19.8 10*3/uL — AB (ref 4.0–10.5)

## 2017-06-12 LAB — MAGNESIUM: MAGNESIUM: 1.8 mg/dL (ref 1.7–2.4)

## 2017-06-12 MED ORDER — FUROSEMIDE 10 MG/ML IJ SOLN
40.0000 mg | Freq: Every day | INTRAMUSCULAR | Status: DC
  Administered 2017-06-12 – 2017-06-13 (×2): 40 mg via INTRAVENOUS
  Filled 2017-06-12 (×2): qty 4

## 2017-06-12 NOTE — Progress Notes (Signed)
[facility] PROGRESS NOTE  Travis Salinas ZOX:[id number] DOB: 06/16/1951 DOA: 05/27/2017  PCP: Kathyrn Lass, MD  Brief History/Interval Summary: 66 year old Caucasian male with a past medical history of COPD, hypertension, depression, anxiety, presented with progressively worsening shortness of breath. Patient was diagnosed with the pneumonia. He was hospitalized. Patient was given IV antibiotics. He was seen by pulmonology and by infectious disease due to slow improvement. Remains on high flow oxygen by nasal cannula.  Reason for Visit: Acute respiratory failure with hypoxia  Consultants: Pulmonology. Infectious disease  Procedures:  Transthoracic echocardiogram Study Conclusions  - Left ventricle: The cavity size was normal. There was mild   concentric hypertrophy. Systolic function was normal. The   estimated ejection fraction was in the range of 60% to 65%. Wall   motion was normal; there were no regional wall motion   abnormalities. Doppler parameters are consistent with abnormal   left ventricular relaxation (grade 1 diastolic dysfunction). - Aortic valve: Transvalvular velocity was within the normal range.   There was no stenosis. There was no regurgitation. - Mitral valve: Transvalvular velocity was within the normal range.   There was no evidence for stenosis. There was trivial   regurgitation. - Right ventricle: The cavity size was normal. Wall thickness was   normal. Systolic function was normal. - Tricuspid valve: There was mild regurgitation. - Pulmonary arteries: Systolic pressure was within the normal   range. PA peak pressure: 32 mm Hg (S).   Antibiotics: Patient completed a course of ceftriaxone on 10/2  Subjective/Interval History: Patient states that he is feeling slightly better today compared to yesterday. Still gets short of breath even with minimal activity. Denies any chest pain   ROS: Denies any nausea or vomiting  Objective:  Vital  Signs  Vitals:   06/12/17 0400 06/12/17 0600 06/12/17 0605 06/12/17 0730  BP: (!) 150/55 (!) 144/57 (!) 144/57   Pulse: 95 87 87 81  Resp: 14 14  (!) 25  Temp:      TempSrc:      SpO2: 92% 99%  95%  Weight:      Height:        Intake/Output Summary (Last 24 hours) at 06/12/17 0759 Last data filed at 06/12/17 0600  Gross per 24 hour  Intake              940 ml  Output              750 ml  Net              190 ml   Filed Weights   05/31/17 0404 06/01/17 0339 06/09/17 1700  Weight: 69.3 kg (152 lb 12.5 oz) 63 kg (138 lb 14.2 oz) 55.1 kg (121 lb 7.6 oz)    General appearance: Awake, alert. In no distress. Resp: Coarse breath sounds bilaterally. crackles bilaterally. No wheezing. rhonchi. Cardio: S1, S2 is normal, regular. No stress for. No rubs, murmurs, or bruit GI: Abdomen is soft. Nontender, nondistended. Bowel sounds are present. No masses or organomegaly Extremities: No edema Neurologic: No focal neurological deficits  Lab Results:  Data Reviewed: I have personally reviewed following labs and imaging studies  CBC:  Recent Labs Lab 06/07/17 0717 06/08/17 0323 06/09/17 1102 06/10/17 0351 06/12/17 0332  WBC 21.4* 20.7* 24.8* 24.6* 19.8*  HGB 11.5* 10.2* 12.3* 11.3* 10.3*  HCT 35.4* 32.4* 38.5* 35.7* 33.5*  MCV 84.9 85.3 85.6 85.4 87.0  PLT 352 367 395 393 286    Basic  Metabolic Panel:  Recent Labs Lab 06/05/17 0823  06/07/17 0332 06/08/17 0323 06/09/17 0800 06/10/17 0351 06/12/17 0332  NA  --   < > 136 134* 135 137 139  K  --   < > 3.8 3.7 4.5 3.7 4.1  CL  --   < > 92* 94* 95* 97* 100*  CO2  --   < > 33* 32 30 31 32  GLUCOSE  --   < > 112* 178* 122* 121* 120*  BUN  --   < > 37* 31* 32* 36* 28*  CREATININE  --   < > 0.95 0.82 0.85 0.93 0.85  CALCIUM  --   < > 7.9* 7.9* 8.0* 8.2* 7.9*  MG 2.1  --   --   --   --   --  1.8  PHOS  --   --  2.9  --   --   --   --   < > = values in this interval not displayed.  GFR: Estimated Creatinine Clearance:  67.5 mL/min (by C-G formula based on SCr of 0.85 mg/dL).  Liver Function Tests:  Recent Labs Lab 06/07/17 0332  ALBUMIN 2.2*     Radiology Studies: No results found.   Medications:  Scheduled: . ARIPiprazole  20 mg Oral Daily  . aspirin EC  81 mg Oral Daily  . atenolol  25 mg Oral BH-q7a  . chlorhexidine  15 mL Mouth Rinse BID  . clonazePAM  0.5 mg Oral BID  . dextromethorphan-guaiFENesin  1 tablet Oral BID  . enoxaparin (LOVENOX) injection  40 mg Subcutaneous Q24H  . feeding supplement (ENSURE ENLIVE)  237 mL Oral BID BM  . FLUoxetine  20 mg Oral Daily  . ibuprofen  600 mg Oral QID  . ipratropium-albuterol  3 mL Nebulization TID  . lamoTRIgine  200 mg Oral q morning - 10a  . mouth rinse  15 mL Mouth Rinse q12n4p  . pantoprazole  40 mg Oral Daily  . predniSONE  40 mg Oral Q breakfast  . tamsulosin  0.4 mg Oral Daily   Continuous: . sodium chloride 10 mL/hr at 06/11/17 1800   OIZ:TIWPYK chloride, acetaminophen, albuterol, benzonatate, bisacodyl, fentaNYL (SUBLIMAZE) injection, haloperidol lactate, LORazepam, ondansetron **OR** ondansetron (ZOFRAN) IV, oxyCODONE-acetaminophen, phenol, sodium chloride  Assessment/Plan:  Principal Problem:   Pneumococcal pneumonia ([place]) Active Problems:   HTN (hypertension)   Chronic hepatitis C without hepatic coma (HCC)   Substance abuse (HCC)   IVDU (intravenous drug user)   Pneumococcal bacteremia   Acute respiratory failure (HCC)   Normocytic anemia   COPD (chronic obstructive pulmonary disease) (HCC)    Sepsis in the setting of multifocal pneumonia due to strep pneumonia Patient had positive blood cultures. Respiratory viral panel positive for rhinovirus. Patient was initially treated with IV antibiotics. Patient was very slow to improve. His antibiotic coverage had to be broadened to include vancomycin and cefepime. Sputum cultures consistent with normal respiratory flora. Patient subsequently seen by infectious disease and  antibiotics narrowed down to ceftriaxone. Patient has completed 14 days of antibiotics and antibiotic has been discontinued. Patient, however, continues to require high flow oxygen by nasal cannula.He is being diuresed.  Acute respiratory failure with hypoxia. This is most likely secondary to pneumonia and perhaps a component of diastolic CHF as well. Patient was started on IV diuretics, which is being continued. 2-D echocardiogram report reviewed. Weight has reduced significantly. Patient also was started on Solu-Medrol and now is on oral prednisone. Due to  persistent need for high flow oxygen patient to be considered for long-term acute care. Pulmonology had been following. Chest x-ray repeated today and shows persistent infiltrates. He will need repeat films in the next few weeks to make sure these infiltrates resolved completely. He may also benefit from being seen by pulmonology as an outpatient.  Strep pneumonia bacteremia Blood cultures repeated with no growth. WBC remains elevated, most likely due to steroids. WBC is better today. He remains afebrile.  Acute COPD exacerbation. Now resolved. Most likely was secondary to acute infection. No wheezing is heard. Continue current treatment.  Acute kidney injury on chronic kidney disease, stage II. Acute kidney injury has resolved. Creatinine is back to baseline. Continue to monitor. Replete electrolytes. Monitor closely as he is on diuretics.  Essential hypertension Continue with atenolol. Blood pressure is reasonably well controlled.  History of depression and anxiety. Continue with home medications.  History of BPH Continuing home medications.  Paroxysmal SVT Resolved with beta blocker. Check electrolytes periodically.Marland Kitchen  History of polysubstance abuse. Urine drug screen positive for cocaine. Counseled.  Normocytic anemia. Hemoglobin has been stable. No evidence for overt bleeding. Continue to monitor.   DVT Prophylaxis: Lovenox     Code Status: Full code  Family Communication: Discussed with the patient  Disposition Plan: Management as outlined above. Case manager looking into LTAC.    LOS: 16 days   [facility] Pager [phone] 06/12/2017, 7:59 AM  If 7PM-7AM, please contact night-coverage at [email or web address], password [facility]

## 2017-06-12 NOTE — Consult Note (Signed)
   [facility] CM Inpatient Consult   06/12/2017  [place] Oct 31, 1950 [id number]   Went to bedside earlier. [name] was sleeping soundly/snoring and writer did not want to disturb. Left contact information at bedside.   Spoke with inpatient RNCM who states it appears member does have LTAC benefit in his policy. Writer advised that [employer] be contacted to be sure that member has benefit. As previous [employer] members have not had the benefit for LTAC in the past.  Will continue to follow on behalf of [place] [employer] program for [employer] employees/dependents with [facility] insurance.  [name], MSN-Ed, RN,BSN [facility] Liaison [phone]

## 2017-06-12 NOTE — Progress Notes (Signed)
Date:  June 12, 2017 Chart reviewed for concurrent status and case management needs.  Will continue to follow patient progress.  Discharge Planning: following for needs  Expected discharge date: [id number]  Rhonda Davis, BSN, RN3, CCM   [phone]  

## 2017-06-12 NOTE — Consult Note (Signed)
   [facility] CM Inpatient Consult   06/12/2017  [place] February 20, 1951 [id number]    Telephone call to inpatient RNCM. Discussed that [facility] insurance members do not have a LTAC benefit.   Made aware that it is okay for bedside visit. Member does remain on high flow oxygen however in stepdown unit.     [name], MSN-Ed, RN,BSN [facility] Liaison [phone]

## 2017-06-13 ENCOUNTER — Inpatient Hospital Stay
Admission: RE | Admit: 2017-06-13 | Discharge: 2017-07-04 | Disposition: A | Discharge status: Skilled Nursing Facility | Payer: Self-pay | Source: Ambulatory Visit | Attending: Internal Medicine | Admitting: Internal Medicine

## 2017-06-13 ENCOUNTER — Other Ambulatory Visit (HOSPITAL_COMMUNITY): Payer: Self-pay

## 2017-06-13 DIAGNOSIS — B181 Chronic viral hepatitis B without delta-agent: Secondary | ICD-10-CM | POA: Diagnosis not present

## 2017-06-13 DIAGNOSIS — R2681 Unsteadiness on feet: Secondary | ICD-10-CM | POA: Diagnosis not present

## 2017-06-13 DIAGNOSIS — J189 Pneumonia, unspecified organism: Secondary | ICD-10-CM

## 2017-06-13 DIAGNOSIS — K219 Gastro-esophageal reflux disease without esophagitis: Secondary | ICD-10-CM | POA: Diagnosis not present

## 2017-06-13 DIAGNOSIS — F19129 Other psychoactive substance abuse with intoxication, unspecified: Secondary | ICD-10-CM | POA: Diagnosis not present

## 2017-06-13 DIAGNOSIS — E46 Unspecified protein-calorie malnutrition: Secondary | ICD-10-CM | POA: Diagnosis not present

## 2017-06-13 DIAGNOSIS — F1910 Other psychoactive substance abuse, uncomplicated: Secondary | ICD-10-CM | POA: Diagnosis not present

## 2017-06-13 DIAGNOSIS — G8922 Chronic post-thoracotomy pain: Secondary | ICD-10-CM | POA: Diagnosis not present

## 2017-06-13 DIAGNOSIS — R531 Weakness: Secondary | ICD-10-CM | POA: Diagnosis not present

## 2017-06-13 DIAGNOSIS — F419 Anxiety disorder, unspecified: Secondary | ICD-10-CM | POA: Diagnosis not present

## 2017-06-13 DIAGNOSIS — I130 Hypertensive heart and chronic kidney disease with heart failure and stage 1 through stage 4 chronic kidney disease, or unspecified chronic kidney disease: Secondary | ICD-10-CM | POA: Diagnosis not present

## 2017-06-13 DIAGNOSIS — I10 Essential (primary) hypertension: Secondary | ICD-10-CM | POA: Diagnosis not present

## 2017-06-13 DIAGNOSIS — M6281 Muscle weakness (generalized): Secondary | ICD-10-CM | POA: Diagnosis not present

## 2017-06-13 DIAGNOSIS — E43 Unspecified severe protein-calorie malnutrition: Secondary | ICD-10-CM | POA: Diagnosis not present

## 2017-06-13 DIAGNOSIS — F339 Major depressive disorder, recurrent, unspecified: Secondary | ICD-10-CM | POA: Diagnosis not present

## 2017-06-13 DIAGNOSIS — N179 Acute kidney failure, unspecified: Secondary | ICD-10-CM | POA: Diagnosis not present

## 2017-06-13 DIAGNOSIS — F418 Other specified anxiety disorders: Secondary | ICD-10-CM | POA: Diagnosis not present

## 2017-06-13 DIAGNOSIS — I471 Supraventricular tachycardia: Secondary | ICD-10-CM | POA: Diagnosis not present

## 2017-06-13 DIAGNOSIS — G8912 Acute post-thoracotomy pain: Secondary | ICD-10-CM | POA: Diagnosis not present

## 2017-06-13 DIAGNOSIS — N400 Enlarged prostate without lower urinary tract symptoms: Secondary | ICD-10-CM | POA: Diagnosis not present

## 2017-06-13 DIAGNOSIS — M545 Low back pain: Secondary | ICD-10-CM | POA: Diagnosis not present

## 2017-06-13 DIAGNOSIS — I5033 Acute on chronic diastolic (congestive) heart failure: Secondary | ICD-10-CM | POA: Diagnosis not present

## 2017-06-13 DIAGNOSIS — J441 Chronic obstructive pulmonary disease with (acute) exacerbation: Secondary | ICD-10-CM | POA: Diagnosis not present

## 2017-06-13 DIAGNOSIS — A419 Sepsis, unspecified organism: Secondary | ICD-10-CM | POA: Diagnosis not present

## 2017-06-13 DIAGNOSIS — F411 Generalized anxiety disorder: Secondary | ICD-10-CM | POA: Diagnosis not present

## 2017-06-13 DIAGNOSIS — J9600 Acute respiratory failure, unspecified whether with hypoxia or hypercapnia: Secondary | ICD-10-CM | POA: Diagnosis not present

## 2017-06-13 DIAGNOSIS — Z681 Body mass index (BMI) 19 or less, adult: Secondary | ICD-10-CM | POA: Diagnosis not present

## 2017-06-13 DIAGNOSIS — M542 Cervicalgia: Secondary | ICD-10-CM | POA: Diagnosis not present

## 2017-06-13 DIAGNOSIS — I5030 Unspecified diastolic (congestive) heart failure: Secondary | ICD-10-CM | POA: Diagnosis not present

## 2017-06-13 LAB — BASIC METABOLIC PANEL
Anion gap: 6 (ref 5–15)
BUN: 31 mg/dL — ABNORMAL HIGH (ref 6–20)
CO2: 33 mmol/L — ABNORMAL HIGH (ref 22–32)
Calcium: 7.9 mg/dL — ABNORMAL LOW (ref 8.9–10.3)
Chloride: 98 mmol/L — ABNORMAL LOW (ref 101–111)
Creatinine, Ser: 0.8 mg/dL (ref 0.61–1.24)
GLUCOSE: 129 mg/dL — AB (ref 65–99)
POTASSIUM: 3.6 mmol/L (ref 3.5–5.1)
Sodium: 137 mmol/L (ref 135–145)

## 2017-06-13 MED ORDER — ORAL CARE MOUTH RINSE: 15.0000 mL | Freq: Two times a day (BID) | OROMUCOSAL | 0 refills | Status: DC

## 2017-06-13 MED ORDER — ONDANSETRON HCL 4 MG PO TABS: 4.0000 mg | ORAL_TABLET | Freq: Four times a day (QID) | ORAL | 0 refills | Status: DC | PRN

## 2017-06-13 MED ORDER — HALOPERIDOL LACTATE 5 MG/ML IJ SOLN: 2.0000 mg | Freq: Four times a day (QID) | INTRAMUSCULAR | Status: DC | PRN

## 2017-06-13 MED ORDER — SALINE SPRAY 0.65 % NA SOLN: 1.0000 | NASAL | 0 refills | Status: DC | PRN

## 2017-06-13 MED ORDER — FLUOXETINE HCL 20 MG PO CAPS: 20.0000 mg | ORAL_CAPSULE | Freq: Every day | ORAL | 3 refills | Status: DC

## 2017-06-13 MED ORDER — BENZONATATE 200 MG PO CAPS: 200.0000 mg | ORAL_CAPSULE | Freq: Three times a day (TID) | ORAL | 0 refills | Status: DC | PRN

## 2017-06-13 MED ORDER — POTASSIUM CHLORIDE CRYS ER 20 MEQ PO TBCR
40.0000 meq | EXTENDED_RELEASE_TABLET | Freq: Once | ORAL | Status: AC
  Administered 2017-06-13: 40 meq via ORAL
  Filled 2017-06-13: qty 2

## 2017-06-13 MED ORDER — HYDROCODONE-ACETAMINOPHEN 5-325 MG PO TABS
1.0000 | ORAL_TABLET | ORAL | Status: DC | PRN
  Administered 2017-06-13: 2 via ORAL
  Filled 2017-06-13: qty 2

## 2017-06-13 MED ORDER — ENSURE ENLIVE PO LIQD: 237.0000 mL | Freq: Two times a day (BID) | ORAL | 12 refills | Status: DC

## 2017-06-13 MED ORDER — SODIUM CHLORIDE 0.9 % IV SOLN: INTRAVENOUS | 0 refills | Status: DC

## 2017-06-13 MED ORDER — BISACODYL 5 MG PO TBEC: 5.0000 mg | DELAYED_RELEASE_TABLET | Freq: Every day | ORAL | 0 refills | Status: DC | PRN

## 2017-06-13 MED ORDER — ASPIRIN 81 MG PO TBEC: 81.0000 mg | DELAYED_RELEASE_TABLET | Freq: Every day | ORAL | Status: DC

## 2017-06-13 MED ORDER — FUROSEMIDE 10 MG/ML IJ SOLN: 40.0000 mg | Freq: Every day | INTRAMUSCULAR | 0 refills | Status: DC

## 2017-06-13 MED ORDER — ARIPIPRAZOLE 20 MG PO TABS: 20.0000 mg | ORAL_TABLET | Freq: Every day | ORAL | Status: DC

## 2017-06-13 MED ORDER — CHLORHEXIDINE GLUCONATE 0.12 % MT SOLN: 15.0000 mL | Freq: Two times a day (BID) | OROMUCOSAL | 0 refills | Status: DC

## 2017-06-13 MED ORDER — CLONAZEPAM 0.5 MG PO TABS: 0.5000 mg | ORAL_TABLET | Freq: Two times a day (BID) | ORAL | 0 refills | Status: DC

## 2017-06-13 MED ORDER — IBUPROFEN 600 MG PO TABS: 600.0000 mg | ORAL_TABLET | Freq: Four times a day (QID) | ORAL | 0 refills | Status: DC

## 2017-06-13 MED ORDER — ONDANSETRON HCL 4 MG/2ML IJ SOLN: 4.0000 mg | Freq: Four times a day (QID) | INTRAMUSCULAR | 0 refills | Status: DC | PRN

## 2017-06-13 MED ORDER — TAMSULOSIN HCL 0.4 MG PO CAPS: 0.4000 mg | ORAL_CAPSULE | Freq: Every day | ORAL | Status: DC

## 2017-06-13 MED ORDER — IPRATROPIUM-ALBUTEROL 0.5-2.5 (3) MG/3ML IN SOLN: 3.0000 mL | Freq: Three times a day (TID) | RESPIRATORY_TRACT | Status: DC

## 2017-06-13 MED ORDER — DM-GUAIFENESIN ER 30-600 MG PO TB12: 1.0000 | ORAL_TABLET | Freq: Two times a day (BID) | ORAL | Status: DC

## 2017-06-13 MED ORDER — ALBUTEROL SULFATE (2.5 MG/3ML) 0.083% IN NEBU: 2.5000 mg | INHALATION_SOLUTION | RESPIRATORY_TRACT | 12 refills | Status: DC | PRN

## 2017-06-13 MED ORDER — HYDROCODONE-ACETAMINOPHEN 5-325 MG PO TABS: 1.0000 | ORAL_TABLET | ORAL | 0 refills | Status: DC | PRN

## 2017-06-13 MED ORDER — PREDNISONE 20 MG PO TABS: 40.0000 mg | ORAL_TABLET | Freq: Every day | ORAL | Status: DC

## 2017-06-13 MED ORDER — LORAZEPAM 2 MG/ML IJ SOLN: 0.5000 mg | Freq: Four times a day (QID) | INTRAMUSCULAR | 0 refills | Status: DC | PRN

## 2017-06-13 MED ORDER — PHENOL 1.4 % MT LIQD: 1.0000 | OROMUCOSAL | 0 refills | Status: DC | PRN

## 2017-06-13 MED ORDER — LAMOTRIGINE 200 MG PO TABS: 200.0000 mg | ORAL_TABLET | Freq: Every morning | ORAL | Status: DC

## 2017-06-13 MED ORDER — ACETAMINOPHEN 325 MG PO TABS
650.0000 mg | ORAL_TABLET | Freq: Four times a day (QID) | ORAL | Status: DC | PRN
Start: 2017-06-13 — End: 2020-01-01

## 2017-06-13 MED ORDER — ATENOLOL 25 MG PO TABS: 25.0000 mg | ORAL_TABLET | ORAL | Status: DC

## 2017-06-13 MED ORDER — ENOXAPARIN SODIUM 40 MG/0.4ML ~~LOC~~ SOLN: 40.0000 mg | SUBCUTANEOUS | Status: DC

## 2017-06-13 MED ORDER — PANTOPRAZOLE SODIUM 40 MG PO TBEC: 40.0000 mg | DELAYED_RELEASE_TABLET | Freq: Every day | ORAL | Status: DC

## 2017-06-13 MED ORDER — FENTANYL CITRATE (PF) 100 MCG/2ML IJ SOLN: 50.0000 ug | INTRAMUSCULAR | 0 refills | Status: DC | PRN

## 2017-06-13 NOTE — Discharge Summary (Signed)
[facility]  Physician Discharge Summary   Patient ID: Travis Salinas MRN: [id number] DOB/AGE: 1951-06-22 66 y.o.  Admit date: 05/27/2017 Discharge date: 06/13/2017  PCP: Kathyrn Lass, MD  DISCHARGE DIAGNOSES:  Principal Problem:   Pneumococcal pneumonia ([place]) Active Problems:   HTN (hypertension)   Chronic hepatitis C without hepatic coma ([place])   Substance abuse ([place])   IVDU (intravenous drug user)   Pneumococcal bacteremia   Acute respiratory failure ([place])   Normocytic anemia   COPD (chronic obstructive pulmonary disease) ([place])   PATIENT BEING DISCHARGED TO LTAC   Diet recommendation: Currently on a regular diet.  Filed Weights   05/31/17 0404 06/01/17 0339 06/09/17 1700  Weight: 69.3 kg (152 lb 12.5 oz) 63 kg (138 lb 14.2 oz) 55.1 kg (121 lb 7.6 oz)    INITIAL HISTORY: 66 year old Caucasian male with a past medical history of COPD, hypertension, depression, anxiety, presented with progressively worsening shortness of breath. Patient was diagnosed with the pneumonia. He was hospitalized. Patient was given IV antibiotics. He was seen by pulmonology and by infectious disease due to slow improvement. Remains on high flow oxygen by nasal cannula.  Consultants: Pulmonology. Infectious disease  Procedures:  Transthoracic echocardiogram Study Conclusions  - Left ventricle: The cavity size was normal. There was mild concentric hypertrophy. Systolic function was normal. The estimated ejection fraction was in the range of 60% to 65%. Wall motion was normal; there were no regional wall motion abnormalities. Doppler parameters are consistent with abnormal left ventricular relaxation (grade 1 diastolic dysfunction). - Aortic valve: Transvalvular velocity was within the normal range. There was no stenosis. There was no regurgitation. - Mitral valve: Transvalvular velocity was within the normal range. There was no evidence for stenosis. There was  trivial regurgitation. - Right ventricle: The cavity size was normal. Wall thickness was normal. Systolic function was normal. - Tricuspid valve: There was mild regurgitation. - Pulmonary arteries: Systolic pressure was within the normal range. PA peak pressure: 32 mm Hg (S).   Antibiotics: Patient completed a course of ceftriaxone on 10/2   HOSPITAL COURSE:   Sepsis in the setting of multifocal pneumonia due to strep pneumonia Patient had positive blood cultures. Respiratory viral panel positive for rhinovirus. Patient was initially treated with IV antibiotics. Patient was very slow to improve. His antibiotic coverage had to be broadened to include vancomycin and cefepime. Sputum cultures consistent with normal respiratory flora. Patient subsequently seen by infectious disease and antibiotics narrowed down to ceftriaxone. Patient has completed 14 days of antibiotics and antibiotic has been discontinued. Patient, however, continues to require high flow oxygen by nasal cannula. He is being diuresed as well.  Acute respiratory failure with hypoxia/possible acute diastolic CHF This is most likely secondary to pneumonia and perhaps a component of diastolic CHF as well. Patient was started on IV diuretics. 2-D echocardiogram report reviewed. Weight has reduced significantly. Patient also was started on Solu-Medrol and now is on oral prednisone. Due to persistent need for high flow oxygen patient was considered for long-term acute care. Pulmonology had been following. Chest x-ray repeated today and shows persistent infiltrates. He will need repeat films in the next few weeks to make sure these infiltrates resolved completely. He may also benefit from being seen by pulmonology as an outpatient. Continue Lasix daily. Assess volume status on a daily basis.  Strep pneumonia bacteremia Blood cultures repeated with no growth. WBC remains elevated, most likely due to steroids. WBC has been  improving. He remains afebrile.  Acute  COPD exacerbation. Now resolved. Most likely was secondary to acute infection. No wheezing is heard. Continue current treatment.  Acute kidney injury on chronic kidney disease, stage II. Acute kidney injury has resolved. Creatinine is back to baseline. Continue to monitor. Replete electrolytes. Monitor closely as he is on diuretics.  Essential hypertension Continue with atenolol. Blood pressure is reasonably well controlled.  History of depression and anxiety. Continue with home medications.  History of BPH Continuing home medications.  Paroxysmal SVT Resolved with beta blocker. Check electrolytes periodically.  History of polysubstance abuse. Urine drug screen positive for cocaine. Counseled.  Normocytic anemia. Hemoglobin has been stable. No evidence for overt bleeding. Continue to monitor.  Patient has been accepted at a long-term acute care facility. He can be transferred today.   PERTINENT LABS:  The results of significant diagnostics from this hospitalization (including imaging, microbiology, ancillary and laboratory) are listed below for reference.      Labs: Basic Metabolic Panel:  Recent Labs Lab 06/07/17 0332 06/08/17 0323 06/09/17 0800 06/10/17 0351 06/12/17 0332 06/13/17 0315  NA 136 134* 135 137 139 137  K 3.8 3.7 4.5 3.7 4.1 3.6  CL 92* 94* 95* 97* 100* 98*  CO2 33* 32 30 31 32 33*  GLUCOSE 112* 178* 122* 121* 120* 129*  BUN 37* 31* 32* 36* 28* 31*  CREATININE 0.95 0.82 0.85 0.93 0.85 0.80  CALCIUM 7.9* 7.9* 8.0* 8.2* 7.9* 7.9*  MG  --   --   --   --  1.8  --   PHOS 2.9  --   --   --   --   --    Liver Function Tests:  Recent Labs Lab 06/07/17 0332  ALBUMIN 2.2*   CBC:  Recent Labs Lab 06/07/17 0717 06/08/17 0323 06/09/17 1102 06/10/17 0351 06/12/17 0332  WBC 21.4* 20.7* 24.8* 24.6* 19.8*  HGB 11.5* 10.2* 12.3* 11.3* 10.3*  HCT 35.4* 32.4* 38.5* 35.7* 33.5*  MCV 84.9 85.3 85.6  85.4 87.0  PLT 352 367 395 393 286   BNP: BNP (last 3 results)  Recent Labs  05/27/17 0826 06/04/17 0933  BNP 134.3* 60.1    IMAGING STUDIES Dg Chest 2 View  Result Date: 05/27/2017 CLINICAL DATA:  Anxiety and shortness of breath. History of hypertension. COPD. EXAM: CHEST  2 VIEW COMPARISON:  09/27/2016 FINDINGS: Normal heart size and pulmonary vascularity. Bilateral perihilar airspace infiltrates, greater on the right. These are new since previous study in could indicate edema, pneumonia, or aspiration depending on the clinical setting. No blunting of costophrenic angles. No pneumothorax. Mediastinal contours appear intact. Calcification of the aorta. IMPRESSION: Bilateral perihilar infiltrates may indicate edema, pneumonia, or aspiration. Electronically Signed   By: Lucienne Capers M.D.   On: 05/27/2017 05:17   Ct Chest Wo Contrast  Result Date: 05/27/2017 CLINICAL DATA:  Hypoxia EXAM: CT CHEST WITHOUT CONTRAST TECHNIQUE: Multidetector CT imaging of the chest was performed following the standard protocol without IV contrast. COMPARISON:  Radiograph 05/27/2017, 06/04/2014 FINDINGS: Cardiovascular: Limited evaluation without intravenous contrast. Ectasia of the ascending segment without aneurysmal dilatation. Aortic atherosclerotic calcifications. Coronary artery calcifications. Heart size upper normal. No large pericardial effusion. Mediastinum/Nodes: Mild mediastinal adenopathy. Pretracheal lymph node measures 11 mm. A left paratracheal lymph node measures 9 mm in size. Limited assessment for hilar adenopathy. Esophagus is within normal limits. Lungs/Pleura: Moderate emphysema. Partial consolidations within the bilateral lower lobes. Extensive bilateral ground-glass densities. No significant pleural effusion. Upper Abdomen: No acute abnormality. Musculoskeletal: No acute or suspicious bone  lesion. IMPRESSION: 1. Moderate emphysema with partial consolidations within the bilateral lower lobes  and extensive bilateral ground-glass densities, overall findings are suspicious for multifocal pneumonia. No significant pleural effusion. 2. Mild mediastinal adenopathy, likely reactive 3. Borderline cardiomegaly Aortic Atherosclerosis (ICD10-I70.0) and Emphysema (ICD10-J43.9). Electronically Signed   By: Donavan Foil M.D.   On: 05/27/2017 22:17   Nm Pulmonary Perf And Vent  Result Date: 05/27/2017 CLINICAL DATA:  Chest pain. Dyspnea. COPD. Former smoker. Elevated D-dimer. EXAM: NUCLEAR MEDICINE VENTILATION - PERFUSION LUNG SCAN TECHNIQUE: Ventilation images were obtained in multiple projections using inhaled aerosol Tc-55m DTPA. Perfusion images were obtained in multiple projections after intravenous injection of Tc-70m MAA. RADIOPHARMACEUTICALS:  29.9 mCi Technetium-6m DTPA aerosol inhalation and 3.8 mCi Technetium-49m MAA IV COMPARISON:  Chest radiograph from earlier today. FINDINGS: Ventilation: Heterogeneous radiotracer uptake throughout the lungs with central airway accumulation, compatible with airways disease. Perfusion: Mildly heterogeneous perfusion to the left lower lobe, with the tiny regions of decreased perfusion less than the corresponding opacity on chest radiograph. No wedge-shaped peripheral perfusion defects. IMPRESSION: Very low probability for pulmonary embolism (0 - 9%). Electronically Signed   By: Ilona Sorrel M.D.   On: 05/27/2017 16:46   Dg Chest Port 1 View  Result Date: 06/12/2017 CLINICAL DATA:  Cough and congestion EXAM: PORTABLE CHEST 1 VIEW COMPARISON:  06/09/2017 FINDINGS: Cardiac shadow is stable. Diffuse acute on chronic airspace disease is again identified throughout both lungs particularly in the left mid lung laterally in the right base laterally. No new focal abnormality is seen. No bony abnormality is noted. IMPRESSION: Stable appearance of acute on chronic infiltrates. Electronically Signed   By: Inez Catalina M.D.   On: 06/12/2017 08:53   Dg Chest Port 1  View  Result Date: 06/09/2017 CLINICAL DATA:  66 year old male with hypoxia. Subsequent encounter. EXAM: PORTABLE CHEST 1 VIEW COMPARISON:  06/06/2017 chest x-ray. FINDINGS: Diffuse slightly asymmetric airspace disease may represent multifocal pneumonia superimposed upon chronic lung changes (asymmetric pulmonary edema less likely consideration). Overall appearance relatively similar to most recent examination. Skin fold rather than pneumothorax noted. Heart size within normal limits. Slightly calcified aorta. IMPRESSION: Similar appearance of diffuse airspace disease superimposed upon chronic lung changes. Electronically Signed   By: Genia Del M.D.   On: 06/09/2017 08:57   Dg Chest Port 1 View  Result Date: 06/06/2017 CLINICAL DATA:  Pneumonia. EXAM: PORTABLE CHEST 1 VIEW COMPARISON:  06/04/2017. 06/03/2017. 06/02/2017. 05/29/2017. CT 05/27/2017. FINDINGS: Mediastinum and hilar structures normal. Heart size stable. Interval slight improvement of bilateral pulmonary infiltrates/edema. No pleural effusion or pneumothorax. IMPRESSION: Interval slight improvement of bilateral pulmonary infiltrates/edema . Electronically Signed   By: Marcello Moores  Register   On: 06/06/2017 06:11   Dg Chest Port 1 View  Result Date: 06/04/2017 CLINICAL DATA:  Hypoxemia. EXAM: PORTABLE CHEST 1 VIEW COMPARISON:  Yesterday FINDINGS: History of pneumonia and fever. There is diffuse interstitial and airspace opacity with mild asymmetric predominance on the right. No effusion or pneumothorax. Normal heart size and mediastinal contours. IMPRESSION: Stable widespread interstitial and airspace opacity history suggesting infection and/or ARDS. Pulmonary edema could contribute. Electronically Signed   By: Monte Fantasia M.D.   On: 06/04/2017 13:43   Dg Chest Port 1 View  Result Date: 06/03/2017 CLINICAL DATA:  Pneumonia EXAM: PORTABLE CHEST 1 VIEW COMPARISON:  06/02/2017 FINDINGS: Diffuse but patchy interstitial and airspace opacity  that is mildly improved from yesterday. No effusion or pneumothorax. Normal heart size for technique. IMPRESSION: History of pneumonia with mild interval  improvement in bilateral airspace disease. Opacity is widespread and ARDS is also considered. Electronically Signed   By: Monte Fantasia M.D.   On: 06/03/2017 07:18   Dg Chest Port 1 View  Result Date: 06/02/2017 CLINICAL DATA:  Community-acquired pneumonia. EXAM: PORTABLE CHEST 1 VIEW COMPARISON:  Radiograph 05/31/2017.  CT 01/24/2017 FINDINGS: Worsening diffuse bilateral airspace disease with increasing opacities in the upper lung zones. Unchanged heart size and mediastinal contour. No pleural fluid. No pneumothorax. No acute osseous abnormalities are seen. IMPRESSION: Worsening bilateral airspace opacities concerning for progressive pneumonia. ARDS or less likely pulmonary edema could have a similar appearance. Electronically Signed   By: Jeb Levering M.D.   On: 06/02/2017 06:00   Dg Chest Port 1 View  Result Date: 05/31/2017 CLINICAL DATA:  Worsening shortness of breath EXAM: PORTABLE CHEST 1 VIEW COMPARISON:  Chest x-rays dated 05/30/2017 and 05/29/2017. FINDINGS: Heart size and mediastinal contours are within normal limits. Atherosclerotic changes noted at the aortic arch. The diffuse bilateral opacities are not significantly changed. No new lung findings. No pleural effusion or pneumothorax seen. No acute or suspicious osseous finding. IMPRESSION: 1. Stable chest x-ray. Diffuse bilateral lung opacities are unchanged, predominantly interstitial, corresponding to the diffuse ground-glass opacities demonstrated on chest CT of 05/27/2017, presumably multifocal pneumonia. 2. Aortic atherosclerosis. Electronically Signed   By: Franki Cabot M.D.   On: 05/31/2017 08:16   Dg Chest Port 1 View  Result Date: 05/30/2017 CLINICAL DATA:  Community acquired pneumonia. EXAM: PORTABLE CHEST 1 VIEW COMPARISON:  05/29/2017. FINDINGS: Mediastinum hilar  structures normal. Rounded density noted over the upper chest of undetermined etiology. This is most likely extrinsic to the patient. Stable cardiomegaly. Diffuse bilateral interstitial infiltrates again noted without interim change. No pleural effusion or pneumothorax . IMPRESSION: 1. Diffuse bilateral from interstitial infiltrates, no change from prior exam. 2. Stable cardiomegaly . Electronically Signed   By: Marcello Moores  Register   On: 05/30/2017 07:03   Dg Chest Port 1 View  Result Date: 05/29/2017 CLINICAL DATA:  Respiratory failure. EXAM: PORTABLE CHEST 1 VIEW COMPARISON:  CT 05/27/2017.  Chest x-ray 05/27/2017. FINDINGS: Mediastinum hilar structures are normal. Heart size normal. Diffuse bilateral pulmonary infiltrates again noted. No significant change. Stable cardiomegaly. No pleural effusion or pneumothorax. IMPRESSION: 1. Diffuse multifocal bilateral pulmonary infiltrates without significant change. 2. Stable cardiomegaly. Electronically Signed   By: Marcello Moores  Register   On: 05/29/2017 06:10   Dg Chest Port 1 View  Result Date: 05/27/2017 CLINICAL DATA:  Increased shortness of breath. EXAM: PORTABLE CHEST 1 VIEW COMPARISON:  May 27, 2017 FINDINGS: Increasing bilateral infiltrates, left greater than right, most consistent with the multifocal infectious process. Noncardiogenic edema is a possibility. The heart, hila, and mediastinum are otherwise unchanged and unremarkable. IMPRESSION: Worsening bilateral pulmonary infiltrates as above. Electronically Signed   By: Dorise Bullion III M.D   On: 05/27/2017 18:25    DISCHARGE EXAMINATION: Vitals:   06/13/17 0800 06/13/17 0853 06/13/17 0900 06/13/17 1000  BP: (!) 150/58     Pulse: (!) 108  89 91  Resp: 17  (!) 21 16  Temp: 98.8 F (37.1 C)     TempSrc: Oral     SpO2: 90% 94% 100% 92%  Weight:      Height:       General appearance: alert, cooperative, appears stated age and no distress Resp: Crackles present bilaterally. Slightly  improved aeration compared to a few days ago. No wheezing. No rhonchi. Cardio: regular rate and rhythm, S1, S2 normal,  no murmur, click, rub or gallop GI: soft, non-tender; bowel sounds normal; no masses,  no organomegaly Extremities: extremities normal, atraumatic, no cyanosis or edema Neurologic: No focal deficits  DISPOSITION: To long-term acute care facility    ALLERGIES:  Allergies  Allergen Reactions  . Propoxyphene Nausea And Vomiting  . Amoxicillin Nausea And Vomiting  . Ciprofloxacin Nausea Only  . Depakote [Divalproex Sodium] Other (See Comments)    hallucinations    Current Inpatient Medications:  Scheduled: . ARIPiprazole  20 mg Oral Daily  . aspirin EC  81 mg Oral Daily  . atenolol  25 mg Oral BH-q7a  . chlorhexidine  15 mL Mouth Rinse BID  . clonazePAM  0.5 mg Oral BID  . dextromethorphan-guaiFENesin  1 tablet Oral BID  . enoxaparin (LOVENOX) injection  40 mg Subcutaneous Q24H  . feeding supplement (ENSURE ENLIVE)  237 mL Oral BID BM  . FLUoxetine  20 mg Oral Daily  . furosemide  40 mg Intravenous Daily  . ibuprofen  600 mg Oral QID  . ipratropium-albuterol  3 mL Nebulization TID  . lamoTRIgine  200 mg Oral q morning - 10a  . mouth rinse  15 mL Mouth Rinse q12n4p  . pantoprazole  40 mg Oral Daily  . predniSONE  40 mg Oral Q breakfast  . tamsulosin  0.4 mg Oral Daily   Continuous: . sodium chloride 10 mL/hr at 06/13/17 1000   DZH:GDJMEQ chloride, acetaminophen, albuterol, benzonatate, bisacodyl, fentaNYL (SUBLIMAZE) injection, haloperidol lactate, HYDROcodone-acetaminophen, LORazepam, ondansetron **OR** ondansetron (ZOFRAN) IV, phenol, sodium chloride   TOTAL DISCHARGE TIME: 35 mins  [facility]  [facility] Pager [phone]  06/13/2017, 10:17 AM

## 2017-06-14 LAB — URINALYSIS, ROUTINE W REFLEX MICROSCOPIC
Bilirubin Urine: NEGATIVE
Glucose, UA: NEGATIVE mg/dL
Hgb urine dipstick: NEGATIVE
Ketones, ur: NEGATIVE mg/dL
LEUKOCYTES UA: NEGATIVE
Nitrite: NEGATIVE
Protein, ur: NEGATIVE mg/dL
SPECIFIC GRAVITY, URINE: 1.014 (ref 1.005–1.030)
pH: 6 (ref 5.0–8.0)

## 2017-06-14 LAB — COMPREHENSIVE METABOLIC PANEL
ALBUMIN: 2.5 g/dL — AB (ref 3.5–5.0)
ALT: 25 U/L (ref 17–63)
ANION GAP: 10 (ref 5–15)
AST: 33 U/L (ref 15–41)
Alkaline Phosphatase: 88 U/L (ref 38–126)
BILIRUBIN TOTAL: 0.3 mg/dL (ref 0.3–1.2)
BUN: 19 mg/dL (ref 6–20)
CHLORIDE: 97 mmol/L — AB (ref 101–111)
CO2: 29 mmol/L (ref 22–32)
Calcium: 8.3 mg/dL — ABNORMAL LOW (ref 8.9–10.3)
Creatinine, Ser: 0.85 mg/dL (ref 0.61–1.24)
GFR calc Af Amer: >60 mL/min (ref >60)
GFR calc non Af Amer: >60 mL/min (ref >60)
GLUCOSE: 118 mg/dL — AB (ref 65–99)
POTASSIUM: 3.9 mmol/L (ref 3.5–5.1)
SODIUM: 136 mmol/L (ref 135–145)
TOTAL PROTEIN: 6.1 g/dL — AB (ref 6.5–8.1)

## 2017-06-14 LAB — CBC
HCT: 33.5 % — ABNORMAL LOW (ref 39.0–52.0)
Hemoglobin: 10.5 g/dL — ABNORMAL LOW (ref 13.0–17.0)
MCH: 27.7 pg (ref 26.0–34.0)
MCHC: 31.3 g/dL (ref 30.0–36.0)
MCV: 88.4 fL (ref 78.0–100.0)
PLATELETS: 374 10*3/uL (ref 150–400)
RBC: 3.79 MIL/uL — AB (ref 4.22–5.81)
RDW: 13.9 % (ref 11.5–15.5)
WBC: 19.8 10*3/uL — ABNORMAL HIGH (ref 4.0–10.5)

## 2017-06-14 LAB — PROTIME-INR
INR: 0.98
Prothrombin Time: 12.9 seconds (ref 11.4–15.2)

## 2017-06-14 LAB — PHOSPHORUS: Phosphorus: 1.6 mg/dL — ABNORMAL LOW (ref 2.5–4.6)

## 2017-06-14 LAB — HEMOGLOBIN A1C
HEMOGLOBIN A1C: 5.9 % — AB (ref 4.8–5.6)
MEAN PLASMA GLUCOSE: 122.63 mg/dL

## 2017-06-14 LAB — MAGNESIUM: MAGNESIUM: 1.8 mg/dL (ref 1.7–2.4)

## 2017-06-14 LAB — TSH: TSH: 1.35 u[IU]/mL (ref 0.350–4.500)

## 2017-06-15 LAB — URINE CULTURE: Culture: <10000 — AB

## 2017-06-16 LAB — RENAL FUNCTION PANEL
ANION GAP: 7 (ref 5–15)
Albumin: 2.3 g/dL — ABNORMAL LOW (ref 3.5–5.0)
BUN: 24 mg/dL — ABNORMAL HIGH (ref 6–20)
CALCIUM: 8.5 mg/dL — AB (ref 8.9–10.3)
CO2: 35 mmol/L — AB (ref 22–32)
Chloride: 96 mmol/L — ABNORMAL LOW (ref 101–111)
Creatinine, Ser: 0.55 mg/dL — ABNORMAL LOW (ref 0.61–1.24)
GFR calc non Af Amer: >60 mL/min (ref >60)
GLUCOSE: 104 mg/dL — AB (ref 65–99)
PHOSPHORUS: 2.4 mg/dL — AB (ref 2.5–4.6)
POTASSIUM: 3.4 mmol/L — AB (ref 3.5–5.1)
SODIUM: 138 mmol/L (ref 135–145)

## 2017-06-16 LAB — CBC
HEMATOCRIT: 32 % — AB (ref 39.0–52.0)
HEMOGLOBIN: 9.6 g/dL — AB (ref 13.0–17.0)
MCH: 26.2 pg (ref 26.0–34.0)
MCHC: 30 g/dL (ref 30.0–36.0)
MCV: 87.4 fL (ref 78.0–100.0)
Platelets: 406 10*3/uL — ABNORMAL HIGH (ref 150–400)
RBC: 3.66 MIL/uL — ABNORMAL LOW (ref 4.22–5.81)
RDW: 13.9 % (ref 11.5–15.5)
WBC: 17.5 10*3/uL — ABNORMAL HIGH (ref 4.0–10.5)

## 2017-06-16 LAB — MAGNESIUM: Magnesium: 2 mg/dL (ref 1.7–2.4)

## 2017-06-17 ENCOUNTER — Other Ambulatory Visit (HOSPITAL_COMMUNITY): Payer: Self-pay

## 2017-06-17 LAB — EXPECTORATED SPUTUM ASSESSMENT W GRAM STAIN, RFLX TO RESP C

## 2017-06-17 LAB — EXPECTORATED SPUTUM ASSESSMENT W REFEX TO RESP CULTURE

## 2017-06-17 LAB — POTASSIUM: Potassium: 4.4 mmol/L (ref 3.5–5.1)

## 2017-06-18 LAB — CBC
HEMATOCRIT: 33.7 % — AB (ref 39.0–52.0)
Hemoglobin: 10 g/dL — ABNORMAL LOW (ref 13.0–17.0)
MCH: 26.5 pg (ref 26.0–34.0)
MCHC: 29.7 g/dL — AB (ref 30.0–36.0)
MCV: 89.2 fL (ref 78.0–100.0)
PLATELETS: 442 10*3/uL — AB (ref 150–400)
RBC: 3.78 MIL/uL — ABNORMAL LOW (ref 4.22–5.81)
RDW: 14.3 % (ref 11.5–15.5)
WBC: 18.6 10*3/uL — ABNORMAL HIGH (ref 4.0–10.5)

## 2017-06-20 LAB — CULTURE, RESPIRATORY W GRAM STAIN: Culture: NORMAL

## 2017-06-20 LAB — CULTURE, RESPIRATORY

## 2017-06-21 LAB — BASIC METABOLIC PANEL
ANION GAP: 7 (ref 5–15)
BUN: 27 mg/dL — ABNORMAL HIGH (ref 6–20)
CHLORIDE: 98 mmol/L — AB (ref 101–111)
CO2: 32 mmol/L (ref 22–32)
Calcium: 8.2 mg/dL — ABNORMAL LOW (ref 8.9–10.3)
Creatinine, Ser: 0.97 mg/dL (ref 0.61–1.24)
Glucose, Bld: 124 mg/dL — ABNORMAL HIGH (ref 65–99)
Potassium: 3.6 mmol/L (ref 3.5–5.1)
Sodium: 137 mmol/L (ref 135–145)

## 2017-06-23 LAB — CBC
HEMATOCRIT: 35.4 % — AB (ref 39.0–52.0)
HEMOGLOBIN: 10.8 g/dL — AB (ref 13.0–17.0)
MCH: 27.1 pg (ref 26.0–34.0)
MCHC: 30.5 g/dL (ref 30.0–36.0)
MCV: 88.7 fL (ref 78.0–100.0)
Platelets: 355 10*3/uL (ref 150–400)
RBC: 3.99 MIL/uL — AB (ref 4.22–5.81)
RDW: 14.6 % (ref 11.5–15.5)
WBC: 18.3 10*3/uL — AB (ref 4.0–10.5)

## 2017-06-23 LAB — BASIC METABOLIC PANEL
ANION GAP: 10 (ref 5–15)
BUN: 18 mg/dL (ref 6–20)
CO2: 30 mmol/L (ref 22–32)
Calcium: 8.6 mg/dL — ABNORMAL LOW (ref 8.9–10.3)
Chloride: 97 mmol/L — ABNORMAL LOW (ref 101–111)
Creatinine, Ser: 0.91 mg/dL (ref 0.61–1.24)
GLUCOSE: 94 mg/dL (ref 65–99)
POTASSIUM: 3.9 mmol/L (ref 3.5–5.1)
SODIUM: 137 mmol/L (ref 135–145)

## 2017-06-30 LAB — BASIC METABOLIC PANEL
Anion gap: 6 (ref 5–15)
BUN: 15 mg/dL (ref 6–20)
CALCIUM: 8.7 mg/dL — AB (ref 8.9–10.3)
CO2: 29 mmol/L (ref 22–32)
CREATININE: 0.96 mg/dL (ref 0.61–1.24)
Chloride: 100 mmol/L — ABNORMAL LOW (ref 101–111)
GFR calc Af Amer: >60 mL/min (ref >60)
GLUCOSE: 81 mg/dL (ref 65–99)
Potassium: 3.9 mmol/L (ref 3.5–5.1)
Sodium: 135 mmol/L (ref 135–145)

## 2017-06-30 LAB — CBC
HCT: 38.8 % — ABNORMAL LOW (ref 39.0–52.0)
Hemoglobin: 11.3 g/dL — ABNORMAL LOW (ref 13.0–17.0)
MCH: 26 pg (ref 26.0–34.0)
MCHC: 29.1 g/dL — ABNORMAL LOW (ref 30.0–36.0)
MCV: 89.2 fL (ref 78.0–100.0)
PLATELETS: 326 10*3/uL (ref 150–400)
RBC: 4.35 MIL/uL (ref 4.22–5.81)
RDW: 14.2 % (ref 11.5–15.5)
WBC: 13.2 10*3/uL — ABNORMAL HIGH (ref 4.0–10.5)

## 2017-06-30 LAB — PHOSPHORUS: Phosphorus: 3.6 mg/dL (ref 2.5–4.6)

## 2017-06-30 LAB — MAGNESIUM: Magnesium: 1.9 mg/dL (ref 1.7–2.4)

## 2017-07-03 LAB — CBC
HEMATOCRIT: 35 % — AB (ref 39.0–52.0)
Hemoglobin: 10.4 g/dL — ABNORMAL LOW (ref 13.0–17.0)
MCH: 26.5 pg (ref 26.0–34.0)
MCHC: 29.7 g/dL — AB (ref 30.0–36.0)
MCV: 89.1 fL (ref 78.0–100.0)
PLATELETS: 307 10*3/uL (ref 150–400)
RBC: 3.93 MIL/uL — ABNORMAL LOW (ref 4.22–5.81)
RDW: 14.3 % (ref 11.5–15.5)
WBC: 14.2 10*3/uL — AB (ref 4.0–10.5)

## 2017-07-03 LAB — MAGNESIUM: Magnesium: 2 mg/dL (ref 1.7–2.4)

## 2017-07-03 LAB — BASIC METABOLIC PANEL
ANION GAP: 8 (ref 5–15)
BUN: 20 mg/dL (ref 6–20)
CALCIUM: 8.4 mg/dL — AB (ref 8.9–10.3)
CO2: 30 mmol/L (ref 22–32)
CREATININE: 0.97 mg/dL (ref 0.61–1.24)
Chloride: 100 mmol/L — ABNORMAL LOW (ref 101–111)
Glucose, Bld: 82 mg/dL (ref 65–99)
Potassium: 4.1 mmol/L (ref 3.5–5.1)
SODIUM: 138 mmol/L (ref 135–145)

## 2017-07-03 LAB — PHOSPHORUS: PHOSPHORUS: 3.5 mg/dL (ref 2.5–4.6)

## 2017-07-04 DIAGNOSIS — J189 Pneumonia, unspecified organism: Secondary | ICD-10-CM | POA: Diagnosis not present

## 2017-07-04 DIAGNOSIS — E43 Unspecified severe protein-calorie malnutrition: Secondary | ICD-10-CM | POA: Diagnosis not present

## 2017-07-04 DIAGNOSIS — I480 Paroxysmal atrial fibrillation: Secondary | ICD-10-CM | POA: Diagnosis not present

## 2017-07-04 DIAGNOSIS — I10 Essential (primary) hypertension: Secondary | ICD-10-CM | POA: Diagnosis not present

## 2017-07-04 DIAGNOSIS — F19129 Other psychoactive substance abuse with intoxication, unspecified: Secondary | ICD-10-CM | POA: Diagnosis not present

## 2017-07-04 DIAGNOSIS — F411 Generalized anxiety disorder: Secondary | ICD-10-CM | POA: Diagnosis not present

## 2017-07-04 DIAGNOSIS — L0390 Cellulitis, unspecified: Secondary | ICD-10-CM | POA: Diagnosis not present

## 2017-07-04 DIAGNOSIS — R2681 Unsteadiness on feet: Secondary | ICD-10-CM | POA: Diagnosis not present

## 2017-07-04 DIAGNOSIS — M6281 Muscle weakness (generalized): Secondary | ICD-10-CM | POA: Diagnosis not present

## 2017-07-04 DIAGNOSIS — M542 Cervicalgia: Secondary | ICD-10-CM | POA: Diagnosis not present

## 2017-07-04 DIAGNOSIS — M129 Arthropathy, unspecified: Secondary | ICD-10-CM | POA: Diagnosis not present

## 2017-07-04 DIAGNOSIS — I5030 Unspecified diastolic (congestive) heart failure: Secondary | ICD-10-CM | POA: Diagnosis not present

## 2017-07-04 DIAGNOSIS — J9601 Acute respiratory failure with hypoxia: Secondary | ICD-10-CM | POA: Diagnosis not present

## 2017-07-04 DIAGNOSIS — R5381 Other malaise: Secondary | ICD-10-CM | POA: Diagnosis not present

## 2017-07-04 DIAGNOSIS — J441 Chronic obstructive pulmonary disease with (acute) exacerbation: Secondary | ICD-10-CM | POA: Diagnosis not present

## 2017-07-04 DIAGNOSIS — J449 Chronic obstructive pulmonary disease, unspecified: Secondary | ICD-10-CM | POA: Diagnosis not present

## 2017-07-04 DIAGNOSIS — N400 Enlarged prostate without lower urinary tract symptoms: Secondary | ICD-10-CM | POA: Diagnosis not present

## 2017-07-04 DIAGNOSIS — F339 Major depressive disorder, recurrent, unspecified: Secondary | ICD-10-CM | POA: Diagnosis not present

## 2017-07-04 DIAGNOSIS — R262 Difficulty in walking, not elsewhere classified: Secondary | ICD-10-CM | POA: Diagnosis not present

## 2017-07-04 DIAGNOSIS — K219 Gastro-esophageal reflux disease without esophagitis: Secondary | ICD-10-CM | POA: Diagnosis not present

## 2017-07-04 DIAGNOSIS — L0291 Cutaneous abscess, unspecified: Secondary | ICD-10-CM | POA: Diagnosis not present

## 2017-07-04 DIAGNOSIS — F1910 Other psychoactive substance abuse, uncomplicated: Secondary | ICD-10-CM | POA: Diagnosis not present

## 2017-07-08 DIAGNOSIS — J449 Chronic obstructive pulmonary disease, unspecified: Secondary | ICD-10-CM | POA: Diagnosis not present

## 2017-07-08 DIAGNOSIS — F339 Major depressive disorder, recurrent, unspecified: Secondary | ICD-10-CM | POA: Diagnosis not present

## 2017-07-08 DIAGNOSIS — F19129 Other psychoactive substance abuse with intoxication, unspecified: Secondary | ICD-10-CM | POA: Diagnosis not present

## 2017-07-09 DIAGNOSIS — R262 Difficulty in walking, not elsewhere classified: Secondary | ICD-10-CM | POA: Diagnosis not present

## 2017-07-09 DIAGNOSIS — R5381 Other malaise: Secondary | ICD-10-CM | POA: Diagnosis not present

## 2017-07-09 DIAGNOSIS — M6281 Muscle weakness (generalized): Secondary | ICD-10-CM | POA: Diagnosis not present

## 2017-07-09 DIAGNOSIS — M542 Cervicalgia: Secondary | ICD-10-CM | POA: Diagnosis not present

## 2017-07-10 DIAGNOSIS — L0291 Cutaneous abscess, unspecified: Secondary | ICD-10-CM | POA: Diagnosis not present

## 2017-07-10 DIAGNOSIS — I5030 Unspecified diastolic (congestive) heart failure: Secondary | ICD-10-CM | POA: Diagnosis not present

## 2017-07-10 DIAGNOSIS — K219 Gastro-esophageal reflux disease without esophagitis: Secondary | ICD-10-CM | POA: Diagnosis not present

## 2017-07-10 DIAGNOSIS — J189 Pneumonia, unspecified organism: Secondary | ICD-10-CM | POA: Diagnosis not present

## 2017-07-10 DIAGNOSIS — I10 Essential (primary) hypertension: Secondary | ICD-10-CM | POA: Diagnosis not present

## 2017-07-10 DIAGNOSIS — J449 Chronic obstructive pulmonary disease, unspecified: Secondary | ICD-10-CM | POA: Diagnosis not present

## 2017-07-10 DIAGNOSIS — M6281 Muscle weakness (generalized): Secondary | ICD-10-CM | POA: Diagnosis not present

## 2017-07-10 DIAGNOSIS — R2681 Unsteadiness on feet: Secondary | ICD-10-CM | POA: Diagnosis not present

## 2017-07-10 DIAGNOSIS — J441 Chronic obstructive pulmonary disease with (acute) exacerbation: Secondary | ICD-10-CM | POA: Diagnosis not present

## 2017-07-10 DIAGNOSIS — L0390 Cellulitis, unspecified: Secondary | ICD-10-CM | POA: Diagnosis not present

## 2017-07-10 DIAGNOSIS — M129 Arthropathy, unspecified: Secondary | ICD-10-CM | POA: Diagnosis not present

## 2017-07-10 DIAGNOSIS — I480 Paroxysmal atrial fibrillation: Secondary | ICD-10-CM | POA: Diagnosis not present

## 2017-07-10 DIAGNOSIS — F1910 Other psychoactive substance abuse, uncomplicated: Secondary | ICD-10-CM | POA: Diagnosis not present

## 2017-07-10 DIAGNOSIS — E43 Unspecified severe protein-calorie malnutrition: Secondary | ICD-10-CM | POA: Diagnosis not present

## 2017-07-11 DIAGNOSIS — F1910 Other psychoactive substance abuse, uncomplicated: Secondary | ICD-10-CM | POA: Diagnosis not present

## 2017-07-11 DIAGNOSIS — I10 Essential (primary) hypertension: Secondary | ICD-10-CM | POA: Diagnosis not present

## 2017-07-11 DIAGNOSIS — J449 Chronic obstructive pulmonary disease, unspecified: Secondary | ICD-10-CM | POA: Diagnosis not present

## 2017-07-11 DIAGNOSIS — J9601 Acute respiratory failure with hypoxia: Secondary | ICD-10-CM | POA: Diagnosis not present

## 2017-07-14 ENCOUNTER — Other Ambulatory Visit: Payer: Self-pay | Admitting: *Deleted

## 2017-07-14 NOTE — Patient Outreach (Addendum)
[facility]) Care Management  07/14/2017  [place] [birth year] [id number]   Subjective: Telephone call to patient's home number, spoke with patient's wife ((Lasandra Beech), per patient's previous verbal authorization, patient gave verbal authorization to speak with patient's wife regarding patient's healthcare needs as needed.   Wife stated patient's name, date of birth, and address.   Discussed [facility] Care Management [facility] Transition of care follow up, wifet voiced understanding, and is in agreement to follow up when patient discharged from rehab facility.   Wife states she remembers speaking with this RNCM in the past.   Wife states patient is currently at [facility] and will be discharged on 07/17/17.   States patient was in the hospital for pneumonia  05/27/17 - 06/13/17 then was discharged to [facility],  and discharged from [facility] on 07/04/17 to [place].     Objective:Per chart review, patient hospital  05/27/17 -06/13/17 for Pneumococcal pneumonia..  Patient discharged to Longterm Acute Care facility [facility] hospital) on 06/13/17.  Patient also hospitalized 01/25/17 - 01/31/17 for Cellulitis left lower extremity. Status post Excisional debridement of left leg wound skin, subcutaneous tissue and tendon 1.5 x 3 x 1 cm with Acell placement (sheet 7 x 10 cm and 1 gm powder) on 01/29/17. Patient also has a history of Chronic hepatitis C without hepatic coma, hypertension, polysubstance abuse, COPD, hepatitis C, BPH, and chronic kidney disease stage 2.   [facility] Care Management Transition of care completed on 02/04/17.     Assessment:Received [facility] Transition of care referral on 05/28/17. Transition of care follow up pending patient contact.      Plan: RNCM will call patient for  telephone outreach attempt, transition of care follow up, within 3 business days of rehab facility discharge notification.     Eleisha Branscomb  H. Annia Friendly, BSN, [place] Management [facility] Telephonic CM Phone: [phone] Fax: [phone]

## 2017-07-16 DIAGNOSIS — I10 Essential (primary) hypertension: Secondary | ICD-10-CM | POA: Diagnosis not present

## 2017-07-16 DIAGNOSIS — I5030 Unspecified diastolic (congestive) heart failure: Secondary | ICD-10-CM | POA: Diagnosis not present

## 2017-07-16 DIAGNOSIS — J449 Chronic obstructive pulmonary disease, unspecified: Secondary | ICD-10-CM | POA: Diagnosis not present

## 2017-07-16 DIAGNOSIS — K219 Gastro-esophageal reflux disease without esophagitis: Secondary | ICD-10-CM | POA: Diagnosis not present

## 2017-07-18 ENCOUNTER — Other Ambulatory Visit: Payer: Self-pay | Admitting: *Deleted

## 2017-07-18 NOTE — Patient Outreach (Signed)
[facility]) Care Management  07/18/2017  [place] 1950/11/20 [id number]  Subjective: Telephone call to patient's home / mobile number, spoke with patient and wife, and HIPAA verified.  [facility] [place] regarding his healthcare needs as needed. Discussed [facility] Care Management UMR Transition of care follow up, patient / patient's voiced understanding, and is in agreement to follow up.   Patient states he is doing much better, is wearing oxygen as needed, able to perform activities of daily living / home management independently with minimal assistance, and has a follow up appointment with primary MD on 07/30/17.   Wife states patient was admitted on 06/13/17 and discharged from [facility] on 07/17/17.  Wife states she has spoken with the home health agency ([facility]) and they will initiate start of care within the next couple of days depending on staffing.  Wife states she will call primary MD to follow up on questions regarding status of Norvasc, medication that patient was taking prior to admission to hospital, and status was not address by skilled nursing facility upon discharge.  She states blood pressure has been running 130's / 80's.  Patient's wife voices understanding of patient's medical diagnosis and treatment plan.  Wife states patient is accessing the following [facility] benefits: outpatient pharmacy, hospital indemnity (not a chosen benefit), and she has family medical leave act (FMLA) in place to assist patient as needed.  States patient has [employer] primary, [employer] as secondary, no longer works, and does not need FMLA.   Wife states patient  does not have any education material, transition of care, care coordination, disease management, disease monitoring, transportation, community resource, or pharmacy needs at this time. Wife and patient  states they are very appreciative  of the follow up and is in agreement to receive [facility] Care Management information.   Objective:Per chart review, patient hospital  05/27/17 -06/13/17 for Pneumococcal pneumonia..  Patient discharged to Longterm Acute Care facility [facility] hospital) on 06/13/17.  Patient also hospitalized 01/25/17 - 01/31/17 for Cellulitis left lower extremity. Status post Excisional debridement of left leg wound skin, subcutaneous tissue and tendon 1.5 x 3 x 1 cm with Acell placement (sheet 7 x 10 cm and 1 gm powder) on 01/29/17. Patient also has a history of Chronic hepatitis C without hepatic coma, hypertension, polysubstance abuse, COPD, hepatitis C, BPH, and chronic kidney disease stage 2.   [facility] Care Management Transition of care completed on 02/04/17.     Assessment:Received [facility]MR Transition of care referral on 05/28/17. Transition of care follow up completed, no care management needs, and will proceed with case closure.      Plan:RNCM will send patient successful outreach letter, [facility] pamphlet, and magnet. RNCM will send case closure due to follow up completed / no care management needs request to [facility] Management.     Jandel Patriarca H. Annia Friendly, BSN, [place] Management [facility] Telephonic CM Phone: [phone] Fax: [phone]

## 2017-07-19 ENCOUNTER — Encounter: Payer: Self-pay | Admitting: *Deleted

## 2017-07-20 DIAGNOSIS — Z7952 Long term (current) use of systemic steroids: Secondary | ICD-10-CM | POA: Diagnosis not present

## 2017-07-20 DIAGNOSIS — I130 Hypertensive heart and chronic kidney disease with heart failure and stage 1 through stage 4 chronic kidney disease, or unspecified chronic kidney disease: Secondary | ICD-10-CM | POA: Diagnosis not present

## 2017-07-20 DIAGNOSIS — N400 Enlarged prostate without lower urinary tract symptoms: Secondary | ICD-10-CM | POA: Diagnosis not present

## 2017-07-20 DIAGNOSIS — Z9981 Dependence on supplemental oxygen: Secondary | ICD-10-CM | POA: Diagnosis not present

## 2017-07-20 DIAGNOSIS — I471 Supraventricular tachycardia: Secondary | ICD-10-CM | POA: Diagnosis not present

## 2017-07-20 DIAGNOSIS — F411 Generalized anxiety disorder: Secondary | ICD-10-CM | POA: Diagnosis not present

## 2017-07-20 DIAGNOSIS — J441 Chronic obstructive pulmonary disease with (acute) exacerbation: Secondary | ICD-10-CM | POA: Diagnosis not present

## 2017-07-20 DIAGNOSIS — E43 Unspecified severe protein-calorie malnutrition: Secondary | ICD-10-CM | POA: Diagnosis not present

## 2017-07-20 DIAGNOSIS — I5031 Acute diastolic (congestive) heart failure: Secondary | ICD-10-CM | POA: Diagnosis not present

## 2017-07-20 DIAGNOSIS — F339 Major depressive disorder, recurrent, unspecified: Secondary | ICD-10-CM | POA: Diagnosis not present

## 2017-07-20 DIAGNOSIS — D631 Anemia in chronic kidney disease: Secondary | ICD-10-CM | POA: Diagnosis not present

## 2017-07-20 DIAGNOSIS — B182 Chronic viral hepatitis C: Secondary | ICD-10-CM | POA: Diagnosis not present

## 2017-07-20 DIAGNOSIS — N182 Chronic kidney disease, stage 2 (mild): Secondary | ICD-10-CM | POA: Diagnosis not present

## 2017-07-30 DIAGNOSIS — A403 Sepsis due to Streptococcus pneumoniae: Secondary | ICD-10-CM | POA: Diagnosis not present

## 2017-07-30 DIAGNOSIS — N400 Enlarged prostate without lower urinary tract symptoms: Secondary | ICD-10-CM | POA: Diagnosis not present

## 2017-07-30 DIAGNOSIS — F1910 Other psychoactive substance abuse, uncomplicated: Secondary | ICD-10-CM | POA: Diagnosis not present

## 2017-07-30 DIAGNOSIS — J449 Chronic obstructive pulmonary disease, unspecified: Secondary | ICD-10-CM | POA: Diagnosis not present

## 2017-07-30 DIAGNOSIS — F39 Unspecified mood [affective] disorder: Secondary | ICD-10-CM | POA: Diagnosis not present

## 2017-07-30 DIAGNOSIS — J189 Pneumonia, unspecified organism: Secondary | ICD-10-CM | POA: Diagnosis not present

## 2017-07-30 DIAGNOSIS — I471 Supraventricular tachycardia: Secondary | ICD-10-CM | POA: Diagnosis not present

## 2017-07-30 DIAGNOSIS — I10 Essential (primary) hypertension: Secondary | ICD-10-CM | POA: Diagnosis not present

## 2017-08-01 DIAGNOSIS — I5031 Acute diastolic (congestive) heart failure: Secondary | ICD-10-CM | POA: Diagnosis not present

## 2017-08-01 DIAGNOSIS — B182 Chronic viral hepatitis C: Secondary | ICD-10-CM | POA: Diagnosis not present

## 2017-08-01 DIAGNOSIS — Z7952 Long term (current) use of systemic steroids: Secondary | ICD-10-CM | POA: Diagnosis not present

## 2017-08-01 DIAGNOSIS — N400 Enlarged prostate without lower urinary tract symptoms: Secondary | ICD-10-CM | POA: Diagnosis not present

## 2017-08-01 DIAGNOSIS — N182 Chronic kidney disease, stage 2 (mild): Secondary | ICD-10-CM | POA: Diagnosis not present

## 2017-08-01 DIAGNOSIS — F411 Generalized anxiety disorder: Secondary | ICD-10-CM | POA: Diagnosis not present

## 2017-08-01 DIAGNOSIS — I130 Hypertensive heart and chronic kidney disease with heart failure and stage 1 through stage 4 chronic kidney disease, or unspecified chronic kidney disease: Secondary | ICD-10-CM | POA: Diagnosis not present

## 2017-08-01 DIAGNOSIS — J441 Chronic obstructive pulmonary disease with (acute) exacerbation: Secondary | ICD-10-CM | POA: Diagnosis not present

## 2017-08-01 DIAGNOSIS — I471 Supraventricular tachycardia: Secondary | ICD-10-CM | POA: Diagnosis not present

## 2017-08-01 DIAGNOSIS — D631 Anemia in chronic kidney disease: Secondary | ICD-10-CM | POA: Diagnosis not present

## 2017-08-01 DIAGNOSIS — Z9981 Dependence on supplemental oxygen: Secondary | ICD-10-CM | POA: Diagnosis not present

## 2017-08-01 DIAGNOSIS — F339 Major depressive disorder, recurrent, unspecified: Secondary | ICD-10-CM | POA: Diagnosis not present

## 2017-08-01 DIAGNOSIS — E43 Unspecified severe protein-calorie malnutrition: Secondary | ICD-10-CM | POA: Diagnosis not present

## 2017-08-02 DIAGNOSIS — N182 Chronic kidney disease, stage 2 (mild): Secondary | ICD-10-CM | POA: Diagnosis not present

## 2017-08-02 DIAGNOSIS — J441 Chronic obstructive pulmonary disease with (acute) exacerbation: Secondary | ICD-10-CM | POA: Diagnosis not present

## 2017-08-02 DIAGNOSIS — I471 Supraventricular tachycardia: Secondary | ICD-10-CM | POA: Diagnosis not present

## 2017-08-02 DIAGNOSIS — F339 Major depressive disorder, recurrent, unspecified: Secondary | ICD-10-CM | POA: Diagnosis not present

## 2017-08-02 DIAGNOSIS — D631 Anemia in chronic kidney disease: Secondary | ICD-10-CM | POA: Diagnosis not present

## 2017-08-02 DIAGNOSIS — Z9981 Dependence on supplemental oxygen: Secondary | ICD-10-CM | POA: Diagnosis not present

## 2017-08-02 DIAGNOSIS — I5031 Acute diastolic (congestive) heart failure: Secondary | ICD-10-CM | POA: Diagnosis not present

## 2017-08-02 DIAGNOSIS — F411 Generalized anxiety disorder: Secondary | ICD-10-CM | POA: Diagnosis not present

## 2017-08-02 DIAGNOSIS — Z7952 Long term (current) use of systemic steroids: Secondary | ICD-10-CM | POA: Diagnosis not present

## 2017-08-02 DIAGNOSIS — B182 Chronic viral hepatitis C: Secondary | ICD-10-CM | POA: Diagnosis not present

## 2017-08-02 DIAGNOSIS — I130 Hypertensive heart and chronic kidney disease with heart failure and stage 1 through stage 4 chronic kidney disease, or unspecified chronic kidney disease: Secondary | ICD-10-CM | POA: Diagnosis not present

## 2017-08-02 DIAGNOSIS — E43 Unspecified severe protein-calorie malnutrition: Secondary | ICD-10-CM | POA: Diagnosis not present

## 2017-08-02 DIAGNOSIS — N400 Enlarged prostate without lower urinary tract symptoms: Secondary | ICD-10-CM | POA: Diagnosis not present

## 2017-08-08 DIAGNOSIS — H52223 Regular astigmatism, bilateral: Secondary | ICD-10-CM | POA: Diagnosis not present

## 2017-08-08 DIAGNOSIS — H5203 Hypermetropia, bilateral: Secondary | ICD-10-CM | POA: Diagnosis not present

## 2017-08-08 DIAGNOSIS — H524 Presbyopia: Secondary | ICD-10-CM | POA: Diagnosis not present

## 2017-08-11 DIAGNOSIS — E43 Unspecified severe protein-calorie malnutrition: Secondary | ICD-10-CM | POA: Diagnosis not present

## 2017-08-11 DIAGNOSIS — N182 Chronic kidney disease, stage 2 (mild): Secondary | ICD-10-CM | POA: Diagnosis not present

## 2017-08-11 DIAGNOSIS — Z7952 Long term (current) use of systemic steroids: Secondary | ICD-10-CM | POA: Diagnosis not present

## 2017-08-11 DIAGNOSIS — I130 Hypertensive heart and chronic kidney disease with heart failure and stage 1 through stage 4 chronic kidney disease, or unspecified chronic kidney disease: Secondary | ICD-10-CM | POA: Diagnosis not present

## 2017-08-11 DIAGNOSIS — J441 Chronic obstructive pulmonary disease with (acute) exacerbation: Secondary | ICD-10-CM | POA: Diagnosis not present

## 2017-08-11 DIAGNOSIS — I471 Supraventricular tachycardia: Secondary | ICD-10-CM | POA: Diagnosis not present

## 2017-08-11 DIAGNOSIS — N400 Enlarged prostate without lower urinary tract symptoms: Secondary | ICD-10-CM | POA: Diagnosis not present

## 2017-08-11 DIAGNOSIS — F339 Major depressive disorder, recurrent, unspecified: Secondary | ICD-10-CM | POA: Diagnosis not present

## 2017-08-11 DIAGNOSIS — F411 Generalized anxiety disorder: Secondary | ICD-10-CM | POA: Diagnosis not present

## 2017-08-11 DIAGNOSIS — I5031 Acute diastolic (congestive) heart failure: Secondary | ICD-10-CM | POA: Diagnosis not present

## 2017-08-11 DIAGNOSIS — B182 Chronic viral hepatitis C: Secondary | ICD-10-CM | POA: Diagnosis not present

## 2017-08-11 DIAGNOSIS — D631 Anemia in chronic kidney disease: Secondary | ICD-10-CM | POA: Diagnosis not present

## 2017-08-11 DIAGNOSIS — Z9981 Dependence on supplemental oxygen: Secondary | ICD-10-CM | POA: Diagnosis not present

## 2017-08-16 DIAGNOSIS — L0390 Cellulitis, unspecified: Secondary | ICD-10-CM | POA: Diagnosis not present

## 2017-08-16 DIAGNOSIS — L0291 Cutaneous abscess, unspecified: Secondary | ICD-10-CM | POA: Diagnosis not present

## 2017-08-16 DIAGNOSIS — I5030 Unspecified diastolic (congestive) heart failure: Secondary | ICD-10-CM | POA: Diagnosis not present

## 2017-08-16 DIAGNOSIS — J189 Pneumonia, unspecified organism: Secondary | ICD-10-CM | POA: Diagnosis not present

## 2017-08-16 DIAGNOSIS — R2681 Unsteadiness on feet: Secondary | ICD-10-CM | POA: Diagnosis not present

## 2017-08-16 DIAGNOSIS — J449 Chronic obstructive pulmonary disease, unspecified: Secondary | ICD-10-CM | POA: Diagnosis not present

## 2017-08-22 ENCOUNTER — Other Ambulatory Visit (INDEPENDENT_AMBULATORY_CARE_PROVIDER_SITE_OTHER): Payer: 59

## 2017-08-22 ENCOUNTER — Encounter: Payer: Self-pay | Admitting: Internal Medicine

## 2017-08-22 ENCOUNTER — Ambulatory Visit (INDEPENDENT_AMBULATORY_CARE_PROVIDER_SITE_OTHER): Payer: 59 | Admitting: Internal Medicine

## 2017-08-22 VITALS — BP 118/64 | HR 83 | Ht 71.0 in | Wt 148.8 lb

## 2017-08-22 DIAGNOSIS — J449 Chronic obstructive pulmonary disease, unspecified: Secondary | ICD-10-CM

## 2017-08-22 DIAGNOSIS — Z8679 Personal history of other diseases of the circulatory system: Secondary | ICD-10-CM | POA: Diagnosis not present

## 2017-08-22 DIAGNOSIS — Z8701 Personal history of pneumonia (recurrent): Secondary | ICD-10-CM

## 2017-08-22 DIAGNOSIS — Z8709 Personal history of other diseases of the respiratory system: Secondary | ICD-10-CM

## 2017-08-22 DIAGNOSIS — J439 Emphysema, unspecified: Secondary | ICD-10-CM

## 2017-08-22 DIAGNOSIS — R5381 Other malaise: Secondary | ICD-10-CM

## 2017-08-22 LAB — CBC WITH DIFFERENTIAL/PLATELET
BASOS ABS: 0.1 10*3/uL (ref 0.0–0.1)
Basophils Relative: 0.9 % (ref 0.0–3.0)
Eosinophils Absolute: 0.5 10*3/uL (ref 0.0–0.7)
Eosinophils Relative: 4.5 % (ref 0.0–5.0)
HCT: 37.3 % — ABNORMAL LOW (ref 39.0–52.0)
HEMOGLOBIN: 11.5 g/dL — AB (ref 13.0–17.0)
LYMPHS ABS: 2.8 10*3/uL (ref 0.7–4.0)
Lymphocytes Relative: 25.6 % (ref 12.0–46.0)
MCHC: 30.9 g/dL (ref 30.0–36.0)
MCV: 84.2 fl (ref 78.0–100.0)
MONO ABS: 1 10*3/uL (ref 0.1–1.0)
Monocytes Relative: 9.2 % (ref 3.0–12.0)
NEUTROS PCT: 59.8 % (ref 43.0–77.0)
Neutro Abs: 6.5 10*3/uL (ref 1.4–7.7)
Platelets: 442 10*3/uL — ABNORMAL HIGH (ref 150.0–400.0)
RBC: 4.43 Mil/uL (ref 4.22–5.81)
RDW: 14.9 % (ref 11.5–15.5)
WBC: 10.9 10*3/uL — AB (ref 4.0–10.5)

## 2017-08-22 LAB — BASIC METABOLIC PANEL
BUN: 22 mg/dL (ref 6–23)
CALCIUM: 9.3 mg/dL (ref 8.4–10.5)
CO2: 28 mEq/L (ref 19–32)
Chloride: 107 mEq/L (ref 96–112)
Creatinine, Ser: 1.14 mg/dL (ref 0.40–1.50)
GFR: 68.44 mL/min (ref >60.00)
GLUCOSE: 97 mg/dL (ref 70–99)
POTASSIUM: 4.5 meq/L (ref 3.5–5.1)
SODIUM: 141 meq/L (ref 135–145)

## 2017-08-22 MED ORDER — TIOTROPIUM BROMIDE MONOHYDRATE 2.5 MCG/ACT IN AERS: 2.0000 | INHALATION_SPRAY | Freq: Every day | RESPIRATORY_TRACT | 5 refills | Status: DC

## 2017-08-22 NOTE — Progress Notes (Signed)
Subjective:    Patient ID: Travis Salinas, male    DOB: June 19, 1951, 66 y.o.   MRN: [id number]  PCP Kathyrn Lass, MD   HPI   IOV  08/22/2017   Chief Complaint  Patient presents with  . Advice Only    Referred by Dr. Lennette Bihari Via for COPD.  Pt does have complaints of SOB. Pt had pna and was hospitalized 10/5-10/26    Travis Salinas. Travis Salinas is a 66 year old male who is been referred by primary care physician for COPD.  History is obtained from him, talking to his wife and review of the outside chart from primary care physician and past medical chart in the electronic health record.  He carries a diagnosis of COPD for many years.  He is always maintained himself on albuterol as needed.  He quit smoking 16 years ago.  Then all of a sudden in September 2018 he was hospitalized for rhinovirus and associated pneumococcal bacteremia with pneumonia.  He spent over a month in the hospital including several weeks in the intensive care unit being treated with BiPAP but not intubated.  He spent time between the critical care service and also the hospitalist service.  He ended up getting discharged in October 2018 to a long-term acute care facility and from there to an inpatient rehab facility.  He has now been home for 6 weeks.  At this point in time his COPD CAT score is 12.  He feels improved and back to baseline but according to his wife he still far away from baseline.  Overall fatigue and shortness of breath with exertion or major issues.  This is documented in the COPD CAD score.  His current COPD medications only albuterol as needed.  He does not have pulmonary function test in the past.  Review of the chart also shows he had acute diastolic heart failure while in the hospital and he was diuresed.  A few days ago primary care physician is stopped his Lasix and his told the family that I could restart it at my discretion.  His last set of lab work was in November 2018 with primary care physician but in  October 2018 in our health record system he had normal creatinine but anemia of chronic disease/critical illness  His last chest x-ray that I personally visualized was June 17, 2017 that still showed pulmonary infiltrates.  According to the family he had another chest x-ray in November 2018 that still shows pulmonary infiltrates.  He has not had follow-up imaging since then.  CAT COPD Symptom & Quality of Life Score (GSK trademark) 0 is no burden. 5 is highest burden 08/22/2017  1  Never Cough -> Cough all the time 0  No phlegm in chest -> Chest is full of phlegm 1  No chest tightness -> Chest feels very tight 4  No dyspnea for 1 flight stairs/hill -> Very dyspneic for 1 flight of stairs 2  No limitations for ADL at home -> Very limited with ADL at home 0  Confident leaving home -> Not at all confident leaving home 0  Sleep soundly -> Do not sleep soundly because of lung condition 4  Lots of Energy -> No energy at all   TOTAL Score (max 40)  12        has a past medical history of Arthritis, Cervical disc herniation, COPD (chronic obstructive pulmonary disease) ([place]), Headache, Hepatitis C, and Hypertension.   reports that he quit smoking about 16 years  ago. His smoking use included cigarettes. He has a 12.50 pack-year smoking history. he has never used smokeless tobacco.  Past Surgical History:  Procedure Laterality Date  . APPLICATION OF A-CELL OF EXTREMITY Left 12/05/2016   Procedure: APPLICATION OF A-CELL OF EXTREMITY;  Surgeon: Loel Lofty Dillingham, DO;  Location: [facility];  Service: Plastics;  Laterality: Left;  . APPLICATION OF WOUND VAC Left 12/05/2016   Procedure: APPLICATION OF WOUND VAC;  Surgeon: Loel Lofty Dillingham, DO;  Location: [facility];  Service: Plastics;  Laterality: Left;  . I&D EXTREMITY Left 11/03/2016   Procedure: IRRIGATION AND DEBRIDEMENT EXTREMITY;  Surgeon: Leandrew Koyanagi, MD;  Location: [facility];  Service: Orthopedics;  Laterality: Left;  . I&D EXTREMITY Left  12/05/2016   Procedure: IRRIGATION AND DEBRIDEMENT EXTREMITY;  Surgeon: Loel Lofty Dillingham, DO;  Location: [facility];  Service: Plastics;  Laterality: Left;  . INCISION AND DRAINAGE ABSCESS Right 11/12/2013   Procedure: INCISION AND DRAINAGE AND OPEN PACKING OF RIGHT CALF  ABSCESS;  Surgeon: Earnstine Regal, MD;  Location: [facility];  Service: General;  Laterality: Right;  . INCISION AND DRAINAGE OF WOUND Left 01/29/2017   Procedure: IRRIGATION AND DEBRIDEMENT OF LEFT LEG WOUND WITH ABRA PLACEMENT AND PLACEMENT OF WOUND VAC;  Surgeon: Wallace Going, DO;  Location: [facility];  Service: Plastics;  Laterality: Left;  . KNEE SURGERY      Allergies  Allergen Reactions  . Propoxyphene Nausea And Vomiting  . Amoxicillin Nausea And Vomiting  . Ciprofloxacin Nausea Only  . Depakote [Divalproex Sodium] Other (See Comments)    hallucinations    Immunization History  Administered Date(s) Administered  . Influenza, High Dose Seasonal PF 06/23/2017  . Influenza, Seasonal, Injecte, Preservative Fre 07/10/2016  . Tdap 11/05/2013    Family History  Problem Relation Age of Onset  . Heart disease Mother   . Stroke Mother   . Heart disease Father   . Stroke Father      Current Outpatient Medications:  .  acetaminophen (TYLENOL) 325 MG tablet, Take 2 tablets (650 mg total) by mouth every 6 (six) hours as needed for mild pain (or Fever >/= 101)., Disp: , Rfl:  .  albuterol (PROVENTIL HFA;VENTOLIN HFA) 108 (90 Base) MCG/ACT inhaler, Inhale 1-2 puffs into the lungs every 6 (six) hours as needed for wheezing or shortness of breath., Disp: , Rfl:  .  albuterol (PROVENTIL) (2.5 MG/3ML) 0.083% nebulizer solution, Take 3 mLs (2.5 mg total) by nebulization every 2 (two) hours as needed for wheezing or shortness of breath., Disp: 75 mL, Rfl: 12 .  ARIPiprazole (ABILIFY) 20 MG tablet, Take 1 tablet (20 mg total) by mouth daily., Disp: , Rfl:  .  aspirin EC 81 MG EC tablet, Take 1 tablet (81 mg total) by mouth daily.,  Disp: , Rfl:  .  atenolol (TENORMIN) 25 MG tablet, Take 1 tablet (25 mg total) by mouth every morning., Disp: , Rfl:  .  clonazePAM (KLONOPIN) 0.5 MG tablet, Take 1 tablet (0.5 mg total) by mouth 2 (two) times daily., Disp: 30 tablet, Rfl: 0 .  feeding supplement, ENSURE ENLIVE, (ENSURE ENLIVE) LIQD, Take 237 mLs by mouth 2 (two) times daily between meals., Disp: 237 mL, Rfl: 12 .  FLUoxetine (PROZAC) 20 MG capsule, Take 1 capsule (20 mg total) by mouth daily., Disp: , Rfl: 3 .  ibuprofen (ADVIL,MOTRIN) 600 MG tablet, Take 1 tablet (600 mg total) by mouth 4 (four) times daily., Disp: 30 tablet, Rfl: 0 .  ipratropium-albuterol (DUONEB) 0.5-2.5 (3) MG/3ML SOLN, Take 3 mLs by nebulization 3 (three) times daily., Disp: 360 mL, Rfl:  .  lamoTRIgine (LAMICTAL) 200 MG tablet, Take 1 tablet (200 mg total) by mouth every morning., Disp: , Rfl:  .  mirtazapine (REMERON) 7.5 MG tablet, Take 7.5 mg by mouth., Disp: , Rfl:  .  silodosin (RAPAFLO) 8 MG CAPS capsule, Take 8 mg daily with breakfast by mouth., Disp: , Rfl:     Review of Systems  Constitutional: Negative for fever and unexpected weight change.  HENT: Positive for dental problem. Negative for congestion, ear pain, nosebleeds, postnasal drip, rhinorrhea, sinus pressure, sneezing, sore throat and trouble swallowing.   Eyes: Negative for redness and itching.  Respiratory: Positive for shortness of breath. Negative for cough, chest tightness and wheezing.   Cardiovascular: Negative for palpitations and leg swelling.  Gastrointestinal: Negative for nausea and vomiting.  Genitourinary: Negative for dysuria.  Musculoskeletal: Negative for joint swelling.  Skin: Negative for rash.  Allergic/Immunologic: Negative.  Negative for environmental allergies, food allergies and immunocompromised state.  Neurological: Positive for headaches.  Hematological: Bruises/bleeds easily.  Psychiatric/Behavioral: Positive for dysphoric mood. The patient is  nervous/anxious.        Objective:   Physical Exam  Constitutional: He is oriented to person, place, and time. He appears well-developed and well-nourished. No distress.  Looks mild deconditioned  HENT:  Head: Normocephalic and atraumatic.  Right Ear: External ear normal.  Left Ear: External ear normal.  Mouth/Throat: Oropharynx is clear and moist. No oropharyngeal exudate.  Eyes: Conjunctivae and EOM are normal. Pupils are equal, round, and reactive to light. Right eye exhibits no discharge. Left eye exhibits no discharge. No scleral icterus.  Neck: Normal range of motion. Neck supple. No JVD present. No tracheal deviation present. No thyromegaly present.  Cardiovascular: Normal rate, regular rhythm and intact distal pulses. Exam reveals no gallop and no friction rub.  No murmur heard. Pulmonary/Chest: Effort normal and breath sounds normal. No respiratory distress. He has no wheezes. He has no rales. He exhibits no tenderness.  Abdominal: Soft. Bowel sounds are normal. He exhibits no distension and no mass. There is no tenderness. There is no rebound and no guarding.  Musculoskeletal: Normal range of motion. He exhibits no edema or tenderness.  Lymphadenopathy:    He has no cervical adenopathy.  Neurological: He is alert and oriented to person, place, and time. He has normal reflexes. No cranial nerve deficit. Coordination normal.  Skin: Skin is warm and dry. No rash noted. He is not diaphoretic. No erythema. No pallor.  Psychiatric: He has a normal mood and affect. His behavior is normal. Judgment and thought content normal.  Nursing note and vitals reviewed.   Vitals:   08/22/17 1129  BP: 118/64  Pulse: 83  SpO2: 96%  Weight: 148 lb 12.8 oz (67.5 kg)  Height: 5\' 11"  (1.803 m)    Estimated body mass index is 20.75 kg/m as calculated from the following:   Height as of this encounter: 5\' 11"  (1.803 m).   Weight as of this encounter: 148 lb 12.8 oz (67.5 kg).         Assessment & Plan:     ICD-10-CM   1. Chronic obstructive pulmonary disease, unspecified COPD type ([place]) J44.9   2. Physical deconditioning R53.81   3. History of diastolic dysfunction N23.55   4. History of acute respiratory failure Z87.09   5. History of bacterial pneumonia Z87.01      Glad you  are better from recent hospitalization and physical deconditioning  the COPD stable  Plan -Continue oxygen use at night -You do need to be on a maintenance inhaler for COPD: Start Spiriva Respimat daily -Continue albuterol as needed -Check alpha-1 antitrypsin phenotype 08/22/2017  -Check CBC, chemistry today August 22, 2017 We can monitor you off Lasix -Referral to pulmonary rehabilitation  -Get pulmonary function test in 3 weeks  -Get CT chest without contrast in 3 weeks  Followup  - dR Ramswamy or an APP in  3 weeks - 4 weeks but after blood work, ct and pft    Dr. Brand Males, M.D., [facility].C.P Pulmonary and Critical Care Medicine Staff Physician, [facility] Director - Interstitial Lung Disease  Program  Pulmonary [place] at [place], [place], [address]  Pager: [phone], If no answer or between  15:00h - 7:00h: call [phone] Telephone: [phone]

## 2017-08-22 NOTE — Patient Instructions (Addendum)
ICD-10-CM   1. Chronic obstructive pulmonary disease, unspecified COPD type ([place]) J44.9   2. Physical deconditioning R53.81   3. History of diastolic dysfunction V49.44   4. History of acute respiratory failure Z87.09   5. History of bacterial pneumonia Z87.01     Glad you are better from recent hospitalization and physical deconditioning  the COPD stable  Plan -Continue oxygen use at night -You do need to be on a maintenance inhaler for COPD: Start Spiriva Respimat daily -Continue albuterol as needed -Check alpha-1 antitrypsin phenotype 08/22/2017  -Check CBC, chemistry today August 22, 2017 We can monitor you off Lasix -Referral to pulmonary rehabilitation  -Get pulmonary function test in 3 weeks  -Get CT chest without contrast in 3 weeks  Followup  - dR Ramswamy or an APP in  3 weeks - 4 weeks but after blood work, ct and pft

## 2017-08-25 ENCOUNTER — Telehealth: Payer: Self-pay | Admitting: Internal Medicine

## 2017-08-25 ENCOUNTER — Telehealth (HOSPITAL_COMMUNITY): Payer: Self-pay

## 2017-08-25 DIAGNOSIS — J438 Other emphysema: Secondary | ICD-10-CM

## 2017-08-25 DIAGNOSIS — J449 Chronic obstructive pulmonary disease, unspecified: Secondary | ICD-10-CM

## 2017-08-25 NOTE — Telephone Encounter (Signed)
Referral received. DX is COPD but under Respiratory Care Services and not COPD. Called Dr.Ramaswamy's office to request new order.

## 2017-08-25 NOTE — Telephone Encounter (Signed)
Do dx of emphysema

## 2017-08-25 NOTE — Telephone Encounter (Signed)
lmtcb X1 for Hannah at [facility]

## 2017-08-25 NOTE — Telephone Encounter (Signed)
Spoke with Travis Salinas at [facility]. She received the order that was sent to them on 08/22/17. The order is going to need to be replaced. The diagnosis associated with the order was COPD. On the order the program selected was [facility] and the diagnosis Emphysema was chosen. Travis Salinas is confused as to which diagnosis needs to be used.  MR - please clarify, thanks.

## 2017-08-26 ENCOUNTER — Telehealth: Payer: Self-pay | Admitting: Internal Medicine

## 2017-08-26 DIAGNOSIS — R3912 Poor urinary stream: Secondary | ICD-10-CM | POA: Diagnosis not present

## 2017-08-26 DIAGNOSIS — N401 Enlarged prostate with lower urinary tract symptoms: Secondary | ICD-10-CM | POA: Diagnosis not present

## 2017-08-26 NOTE — Telephone Encounter (Signed)
Travis Salinas at [facility] rehab aware.  New order placed at [facility].  Nothing further needed.

## 2017-08-26 NOTE — Telephone Encounter (Signed)
Blood labs normal  ATC pt, no answer. Left message for pt to call back.

## 2017-08-26 NOTE — Telephone Encounter (Signed)
Pt calling back about lab results. [phone]

## 2017-08-26 NOTE — Telephone Encounter (Signed)
Notes recorded by Brand Males, MD on 08/22/2017 at 4:28 PM EST Blood labs normal -------------------------------- Spoke with pt. He is aware of results. Nothing further was needed.

## 2017-08-30 LAB — ALPHA-1 ANTITRYPSIN PHENOTYPE: A-1 Antitrypsin, Ser: 203 mg/dL — ABNORMAL HIGH (ref 83–199)

## 2017-09-01 ENCOUNTER — Telehealth: Payer: Self-pay | Admitting: Internal Medicine

## 2017-09-01 NOTE — Telephone Encounter (Signed)
Spoke with the pt  He is c/o nasal congestion,cough, and slight HA x 2 days  He states his cough is mainly non prod, but has produced very minimal white sputum today  His breathing is at baseline  He denies any f/c/s, aches, CP, wheezing, sore throat He has not tried anything OTC yet  I offered him an ov today and he declined  Please advise recs, thanks!

## 2017-09-01 NOTE — Telephone Encounter (Signed)
Pt is aware of below message and voiced his understanding. Nothing further is needed.  

## 2017-09-01 NOTE — Telephone Encounter (Signed)
Would try mucinex DM Twice daily As needed  Cough/congestion .  Saline nasal rinses As needed   Fluids and rest  Tylenol As needed   Continue on Spriiva daily  Use albuterol neb As needed   If not improving call back .  Please contact office for sooner follow up if symptoms do not improve or worsen or seek emergency care

## 2017-09-03 DIAGNOSIS — J209 Acute bronchitis, unspecified: Secondary | ICD-10-CM | POA: Diagnosis not present

## 2017-09-03 DIAGNOSIS — J440 Chronic obstructive pulmonary disease with acute lower respiratory infection: Secondary | ICD-10-CM | POA: Diagnosis not present

## 2017-09-12 ENCOUNTER — Other Ambulatory Visit: Payer: 59

## 2017-09-15 ENCOUNTER — Telehealth (HOSPITAL_COMMUNITY): Payer: Self-pay

## 2017-09-15 NOTE — Telephone Encounter (Signed)
Patients insurance is active and benefits verified through [facility] - No co-pay, deductible amount of $300.00$0.00 has been met, out of pocket amount of $7,900/$25.00 has been met, 20% co-insurance, and no pre-authorization is required. Reference # [id number]  Patients insurance is active and benefits verified through [facility] - $15.00 co-pay, no deductible, out of pocket amount of $3,400/$0.00 has been met, no co-insurance, and no pre-authorization is required. Reference [id number]  Patient will be contacted and scheduled.

## 2017-09-16 DIAGNOSIS — J449 Chronic obstructive pulmonary disease, unspecified: Secondary | ICD-10-CM | POA: Diagnosis not present

## 2017-09-16 DIAGNOSIS — J189 Pneumonia, unspecified organism: Secondary | ICD-10-CM | POA: Diagnosis not present

## 2017-09-16 DIAGNOSIS — L0291 Cutaneous abscess, unspecified: Secondary | ICD-10-CM | POA: Diagnosis not present

## 2017-09-16 DIAGNOSIS — L0390 Cellulitis, unspecified: Secondary | ICD-10-CM | POA: Diagnosis not present

## 2017-09-16 DIAGNOSIS — R2681 Unsteadiness on feet: Secondary | ICD-10-CM | POA: Diagnosis not present

## 2017-09-16 DIAGNOSIS — I5030 Unspecified diastolic (congestive) heart failure: Secondary | ICD-10-CM | POA: Diagnosis not present

## 2017-09-17 ENCOUNTER — Telehealth: Payer: Self-pay | Admitting: Internal Medicine

## 2017-09-17 DIAGNOSIS — F341 Dysthymic disorder: Secondary | ICD-10-CM | POA: Diagnosis not present

## 2017-09-17 DIAGNOSIS — F3341 Major depressive disorder, recurrent, in partial remission: Secondary | ICD-10-CM | POA: Diagnosis not present

## 2017-09-17 DIAGNOSIS — F411 Generalized anxiety disorder: Secondary | ICD-10-CM | POA: Diagnosis not present

## 2017-09-17 NOTE — Telephone Encounter (Signed)
atc pt's spouse, no answer and no vm.  Wcb.  

## 2017-09-18 NOTE — Telephone Encounter (Signed)
Spoke with patient's wife. Explained that someone had called about 2 days ago in regards to his alpha 1 testing that came back negative. Explained that MR still wanted him to proceed with the CT scan and PFTs.   She verbalized understanding. Nothing else needed at time of call.

## 2017-09-19 ENCOUNTER — Ambulatory Visit (INDEPENDENT_AMBULATORY_CARE_PROVIDER_SITE_OTHER)
Admission: RE | Admit: 2017-09-19 | Discharge: 2017-09-19 | Disposition: A | Discharge status: Home / Self Care | Payer: 59 | Source: Ambulatory Visit | Attending: Internal Medicine | Admitting: Internal Medicine

## 2017-09-19 DIAGNOSIS — J449 Chronic obstructive pulmonary disease, unspecified: Secondary | ICD-10-CM | POA: Diagnosis not present

## 2017-09-22 ENCOUNTER — Telehealth: Payer: Self-pay | Admitting: Internal Medicine

## 2017-09-22 NOTE — Telephone Encounter (Signed)
Called and spoke with pt's wife Travis Salinas letting her know that MR stated the FMLA paperwork needed to be taken to medical records for them to fill out and then once it is filled out by medical records, it can be brought back to our office for MR to sign.  Travis Salinas expressed understanding. Told her I would place it up front for her to come pick it up and I would also place the name and address of the place where she needs to take it to have it filled out.  Nothing further needed at this time.

## 2017-09-24 ENCOUNTER — Telehealth: Payer: Self-pay | Admitting: Internal Medicine

## 2017-09-24 NOTE — Telephone Encounter (Signed)
MR will be back in the office Monday, 09/29/17. Will give the paperwork to him then to fill out.

## 2017-10-01 ENCOUNTER — Ambulatory Visit (INDEPENDENT_AMBULATORY_CARE_PROVIDER_SITE_OTHER): Payer: 59 | Admitting: Internal Medicine

## 2017-10-01 ENCOUNTER — Encounter: Payer: Self-pay | Admitting: Internal Medicine

## 2017-10-01 DIAGNOSIS — Z8709 Personal history of other diseases of the respiratory system: Secondary | ICD-10-CM | POA: Diagnosis not present

## 2017-10-01 DIAGNOSIS — J449 Chronic obstructive pulmonary disease, unspecified: Secondary | ICD-10-CM

## 2017-10-01 DIAGNOSIS — J432 Centrilobular emphysema: Secondary | ICD-10-CM | POA: Diagnosis not present

## 2017-10-01 DIAGNOSIS — Z8701 Personal history of pneumonia (recurrent): Secondary | ICD-10-CM

## 2017-10-01 DIAGNOSIS — R5381 Other malaise: Secondary | ICD-10-CM | POA: Diagnosis not present

## 2017-10-01 NOTE — Progress Notes (Signed)
Subjective:     Patient ID: Travis Salinas, male   DOB: 1951-04-30, 67 y.o.   MRN: [id number]  HPI   PCP Kathyrn Lass, MD   HPI   IOV  08/22/2017   Chief Complaint  Patient presents with  . Advice Only    Referred by Dr. Lennette Bihari Via for COPD.  Pt does have complaints of SOB. Pt had pna and was hospitalized 10/5-10/26    [name]. [name] is a 67 year old male who is been referred by primary care physician for COPD.  History is obtained from him, talking to his wife and review of the outside chart from primary care physician and past medical chart in the electronic health record.  He carries a diagnosis of COPD for many years.  He is always maintained himself on albuterol as needed.  He quit smoking 16 years ago.  Then all of a sudden in September 2018 he was hospitalized for rhinovirus and associated pneumococcal bacteremia with pneumonia.  He spent over a month in the hospital including several weeks in the intensive care unit being treated with BiPAP but not intubated.  He spent time between the critical care service and also the hospitalist service.  He ended up getting discharged in October 2018 to a long-term acute care facility and from there to an inpatient rehab facility.  He has now been home for 6 weeks.  At this point in time his COPD CAT score is 12.  He feels improved and back to baseline but according to his wife he still far away from baseline.  Overall fatigue and shortness of breath with exertion or major issues.  This is documented in the COPD CAD score.  His current COPD medications only albuterol as needed.  He does not have pulmonary function test in the past.  Review of the chart also shows he had acute diastolic heart failure while in the hospital and he was diuresed.  A few days ago primary care physician is stopped his Lasix and his told the family that I could restart it at my discretion.  His last set of lab work was in November 2018 with primary care physician but in  October 2018 in our health record system he had normal creatinine but anemia of chronic disease/critical illness  His last chest x-ray that I personally visualized was June 17, 2017 that still showed pulmonary infiltrates.  According to the family he had another chest x-ray in November 2018 that still shows pulmonary infiltrates.  He has not had follow-up imaging since then.   OV 10/01/2017  Chief Complaint  Patient presents with  . Follow-up    PFT done today.  CT scan done 09/19/17.  Pt states he has been doing much better since last visit. States he has mild SOB with exertion. Pt's wife states he used his O2 last night and some this morning which pt stated it was to prepare for today's visit.   Follow-up emphysema and recent hospitalization for pneumococcal pneumonia  Present his wife.  In the interim he has been on Spiriva.  Smoking is in remission.  He continues to improve.  COPD CAT score is dropped to 6.  He feels great.  He is here to start pulmonary rehabilitation.  He had follow-up CT scan of the chest in January 2019 and when compared to his pneumonia in September 2018 his or pulmonary infiltrates of all clear except he has bilateral upper lobe scarring.  He does have evidence of emphysema  on a CT chest.  His pulmonary function test is normal except for reduction in DLCO at 40%.  He continues to use oxygen at night.  He is compliant with his Spiriva.  His blood work is all normal.  His wife wants FMLA paperwork filled because she has to take him to rehab.  They are wondering about the rehab referral.    CAT COPD Symptom & Quality of Life Score ([place] trademark) 0 is no burden. 5 is highest burden 08/22/2017  1 10/01/2017 spiriva and time  Never Cough -> Cough all the time 0 0  No phlegm in chest -> Chest is full of phlegm 1 0  No chest tightness -> Chest feels very tight 4 0  No dyspnea for 1 flight stairs/hill -> Very dyspneic for 1 flight of stairs 2 2  No limitations for ADL at  home -> Very limited with ADL at home 0 1  Confident leaving home -> Not at all confident leaving home 0 0  Sleep soundly -> Do not sleep soundly because of lung condition 4 0  Lots of Energy -> No energy at all  3  TOTAL Score (max 40)  12 6        has a past medical history of Arthritis, Cervical disc herniation, COPD (chronic obstructive pulmonary disease) ([place]), Headache, Hepatitis C, and Hypertension.   reports that he quit smoking about 17 years ago. His smoking use included cigarettes. He has a 12.50 pack-year smoking history. he has never used smokeless tobacco.  Past Surgical History:  Procedure Laterality Date  . APPLICATION OF A-CELL OF EXTREMITY Left 12/05/2016   Procedure: APPLICATION OF A-CELL OF EXTREMITY;  Surgeon: Loel Lofty Dillingham, DO;  Location: [facility];  Service: Plastics;  Laterality: Left;  . APPLICATION OF WOUND VAC Left 12/05/2016   Procedure: APPLICATION OF WOUND VAC;  Surgeon: Loel Lofty Dillingham, DO;  Location: [facility];  Service: Plastics;  Laterality: Left;  . I&D EXTREMITY Left 11/03/2016   Procedure: IRRIGATION AND DEBRIDEMENT EXTREMITY;  Surgeon: Leandrew Koyanagi, MD;  Location: [facility];  Service: Orthopedics;  Laterality: Left;  . I&D EXTREMITY Left 12/05/2016   Procedure: IRRIGATION AND DEBRIDEMENT EXTREMITY;  Surgeon: Loel Lofty Dillingham, DO;  Location: [facility];  Service: Plastics;  Laterality: Left;  . INCISION AND DRAINAGE ABSCESS Right 11/12/2013   Procedure: INCISION AND DRAINAGE AND OPEN PACKING OF RIGHT CALF  ABSCESS;  Surgeon: Earnstine Regal, MD;  Location: [facility];  Service: General;  Laterality: Right;  . INCISION AND DRAINAGE OF WOUND Left 01/29/2017   Procedure: IRRIGATION AND DEBRIDEMENT OF LEFT LEG WOUND WITH ABRA PLACEMENT AND PLACEMENT OF WOUND VAC;  Surgeon: Wallace Going, DO;  Location: [facility];  Service: Plastics;  Laterality: Left;  . KNEE SURGERY      Allergies  Allergen Reactions  . Propoxyphene Nausea And Vomiting  . Amoxicillin Nausea And  Vomiting  . Ciprofloxacin Nausea Only  . Depakote [Divalproex Sodium] Other (See Comments)    hallucinations    Immunization History  Administered Date(s) Administered  . Influenza, High Dose Seasonal PF 06/23/2017  . Influenza, Seasonal, Injecte, Preservative Fre 07/10/2016  . Tdap 11/05/2013    Family History  Problem Relation Age of Onset  . Heart disease Mother   . Stroke Mother   . Heart disease Father   . Stroke Father      Current Outpatient Medications:  .  acetaminophen (TYLENOL) 325 MG tablet, Take 2 tablets (650 mg total) by  mouth every 6 (six) hours as needed for mild pain (or Fever >/= 101)., Disp: , Rfl:  .  albuterol (PROVENTIL HFA;VENTOLIN HFA) 108 (90 Base) MCG/ACT inhaler, Inhale 1-2 puffs into the lungs every 6 (six) hours as needed for wheezing or shortness of breath., Disp: , Rfl:  .  albuterol (PROVENTIL) (2.5 MG/3ML) 0.083% nebulizer solution, Take 3 mLs (2.5 mg total) by nebulization every 2 (two) hours as needed for wheezing or shortness of breath., Disp: 75 mL, Rfl: 12 .  ARIPiprazole (ABILIFY) 20 MG tablet, Take 1 tablet (20 mg total) by mouth daily., Disp: , Rfl:  .  aspirin EC 81 MG EC tablet, Take 1 tablet (81 mg total) by mouth daily., Disp: , Rfl:  .  atenolol (TENORMIN) 25 MG tablet, Take 1 tablet (25 mg total) by mouth every morning., Disp: , Rfl:  .  clonazePAM (KLONOPIN) 0.5 MG tablet, Take 1 tablet (0.5 mg total) by mouth 2 (two) times daily., Disp: 30 tablet, Rfl: 0 .  feeding supplement, ENSURE ENLIVE, (ENSURE ENLIVE) LIQD, Take 237 mLs by mouth 2 (two) times daily between meals., Disp: 237 mL, Rfl: 12 .  FLUoxetine (PROZAC) 20 MG capsule, Take 1 capsule (20 mg total) by mouth daily., Disp: , Rfl: 3 .  ibuprofen (ADVIL,MOTRIN) 600 MG tablet, Take 1 tablet (600 mg total) by mouth 4 (four) times daily., Disp: 30 tablet, Rfl: 0 .  ipratropium-albuterol (DUONEB) 0.5-2.5 (3) MG/3ML SOLN, Take 3 mLs by nebulization 3 (three) times daily., Disp: 360  mL, Rfl:  .  lamoTRIgine (LAMICTAL) 200 MG tablet, Take 1 tablet (200 mg total) by mouth every morning., Disp: , Rfl:  .  silodosin (RAPAFLO) 8 MG CAPS capsule, Take 8 mg daily with breakfast by mouth., Disp: , Rfl:  .  Tiotropium Bromide Monohydrate (SPIRIVA RESPIMAT) 2.5 MCG/ACT AERS, Inhale 2 puffs into the lungs daily., Disp: 1 Inhaler, Rfl: 5   Review of Systems     Objective:   Physical Exam  Constitutional: He is oriented to person, place, and time. He appears well-developed and well-nourished. No distress.  HENT:  Head: Normocephalic and atraumatic.  Right Ear: External ear normal.  Left Ear: External ear normal.  Mouth/Throat: Oropharynx is clear and moist. No oropharyngeal exudate.  Eyes: Conjunctivae and EOM are normal. Pupils are equal, round, and reactive to light. Right eye exhibits no discharge. Left eye exhibits no discharge. No scleral icterus.  Neck: Normal range of motion. Neck supple. No JVD present. No tracheal deviation present. No thyromegaly present.  Cardiovascular: Normal rate, regular rhythm and intact distal pulses. Exam reveals no gallop and no friction rub.  No murmur heard. Pulmonary/Chest: Effort normal and breath sounds normal. No respiratory distress. He has no wheezes. He has no rales. He exhibits no tenderness.  Abdominal: Soft. Bowel sounds are normal. He exhibits no distension and no mass. There is no tenderness. There is no rebound and no guarding.  Musculoskeletal: Normal range of motion. He exhibits no edema or tenderness.  Lymphadenopathy:    He has no cervical adenopathy.  Neurological: He is alert and oriented to person, place, and time. He has normal reflexes. No cranial nerve deficit. Coordination normal.  Skin: Skin is warm and dry. No rash noted. He is not diaphoretic. No erythema. No pallor.  Psychiatric: He has a normal mood and affect. His behavior is normal. Judgment and thought content normal.  Nursing note and vitals  reviewed.  Vitals:   10/01/17 1006  BP: (!) 142/72  Pulse:  72  SpO2: 98%  Weight: 140 lb (63.5 kg)  Height: 5\' 10"  (1.778 m)   Estimated body mass index is 20.09 kg/m as calculated from the following:   Height as of this encounter: 5\' 10"  (1.778 m).   Weight as of this encounter: 140 lb (63.5 kg).     Assessment:       ICD-10-CM   1. Centrilobular emphysema ([place]) J43.2   2. Physical deconditioning R53.81   3. History of bacterial pneumonia Z87.01   4. History of acute respiratory failure Z87.09        Plan:       Glad you are continuing to get better Glad smoking still under remission Pneumonia has cleared up on CT Jan 2019 compared to [place] 2019; just some scar tissue in upper lobe Blood work dec 2018 is normal  Plan -Continue oxygen use at night -continue  Spiriva Respimat daily -Continue albuterol as needed -Re-Referral to pulmonary rehabilitation  - FMLA form for wife    Followup  - dR Ramswamy or an APP in  6 months or sooner if needed  - at folloowup will discuss referral to lung cancer screening program   > 50% of this > 25 min visit spent in face to face counseling or coordination of care    Dr. Brand Males, M.D., [facility].C.P Pulmonary and Critical Care Medicine Staff Physician, [place] Director - Interstitial Lung Disease  Program  Pulmonary [place] at [place], [place], [address]  Pager: [phone], If no answer or between  15:00h - 7:00h: call [phone] Telephone: [phone]

## 2017-10-01 NOTE — Patient Instructions (Addendum)
ICD-10-CM   1. Centrilobular emphysema ([place]) J43.2   2. Physical deconditioning R53.81   3. History of bacterial pneumonia Z87.01   4. History of acute respiratory failure Z87.09      Glad you are continuing to get better Glad smoking still under remission Pneumonia has cleared up on CT Jan 2019 compared to [place] 2019; just some scar tissue in upper lobe Blood work dec 2018 is normal  Plan -Continue oxygen use at night -continue  Spiriva Respimat daily -Continue albuterol as needed -Re-Referral to pulmonary rehabilitation  - FMLA form for wife    Followup  - dR Ramswamy or an APP in  6 months or sooner if needed  - at folloowup will discuss referral to lung cancer screening program

## 2017-10-01 NOTE — Progress Notes (Signed)
PFT done today. 

## 2017-10-01 NOTE — Telephone Encounter (Signed)
Rec'd completed paperwork from MR - fwd to [facility] via interoffice mail -pr

## 2017-10-08 DIAGNOSIS — M47812 Spondylosis without myelopathy or radiculopathy, cervical region: Secondary | ICD-10-CM | POA: Diagnosis not present

## 2017-10-08 DIAGNOSIS — M47816 Spondylosis without myelopathy or radiculopathy, lumbar region: Secondary | ICD-10-CM | POA: Diagnosis not present

## 2017-10-09 ENCOUNTER — Telehealth (HOSPITAL_COMMUNITY): Payer: Self-pay

## 2017-10-09 NOTE — Telephone Encounter (Signed)
Called and spoke with patients wife in regards to Pulmonary Rehab - Scheduled orientation on 10/27/2017 at 1:30pm. Patient will attend the 1:30pm exc class.

## 2017-10-17 DIAGNOSIS — R51 Headache: Secondary | ICD-10-CM | POA: Diagnosis not present

## 2017-10-17 DIAGNOSIS — L0390 Cellulitis, unspecified: Secondary | ICD-10-CM | POA: Diagnosis not present

## 2017-10-17 DIAGNOSIS — R2681 Unsteadiness on feet: Secondary | ICD-10-CM | POA: Diagnosis not present

## 2017-10-17 DIAGNOSIS — F1910 Other psychoactive substance abuse, uncomplicated: Secondary | ICD-10-CM | POA: Diagnosis not present

## 2017-10-17 DIAGNOSIS — I5030 Unspecified diastolic (congestive) heart failure: Secondary | ICD-10-CM | POA: Diagnosis not present

## 2017-10-17 DIAGNOSIS — I471 Supraventricular tachycardia: Secondary | ICD-10-CM | POA: Diagnosis not present

## 2017-10-17 DIAGNOSIS — J449 Chronic obstructive pulmonary disease, unspecified: Secondary | ICD-10-CM | POA: Diagnosis not present

## 2017-10-17 DIAGNOSIS — J189 Pneumonia, unspecified organism: Secondary | ICD-10-CM | POA: Diagnosis not present

## 2017-10-17 DIAGNOSIS — F39 Unspecified mood [affective] disorder: Secondary | ICD-10-CM | POA: Diagnosis not present

## 2017-10-17 DIAGNOSIS — L0291 Cutaneous abscess, unspecified: Secondary | ICD-10-CM | POA: Diagnosis not present

## 2017-10-17 DIAGNOSIS — I10 Essential (primary) hypertension: Secondary | ICD-10-CM | POA: Diagnosis not present

## 2017-10-27 ENCOUNTER — Encounter (HOSPITAL_COMMUNITY): Payer: Self-pay

## 2017-10-27 ENCOUNTER — Encounter (HOSPITAL_COMMUNITY)
Admission: RE | Admit: 2017-10-27 | Discharge: 2017-10-27 | Disposition: A | Discharge status: Home / Self Care | Payer: 59 | Source: Ambulatory Visit | Attending: Internal Medicine | Admitting: Internal Medicine

## 2017-10-27 DIAGNOSIS — J438 Other emphysema: Secondary | ICD-10-CM | POA: Diagnosis not present

## 2017-10-27 NOTE — Progress Notes (Signed)
Travis Salinas 67 y.o. male Pulmonary Rehab Orientation Note Patient arrived today in Cardiac and Pulmonary Rehab for orientation to Pulmonary Rehab. He ambulated from the employees parking deck accompanied by his wife who is a [employer] employee. He does not carry portable oxygen, however he has in the past. Per pt, he uses oxygen continuously at night at 2 LPM Color good, skin warm and dry. Patient is oriented to time and place. Patient's medical history, psychosocial health, and medications reviewed. Psychosocial assessment reveals pt lives with their spouse. Pt is currently retired. Pt hobbies include fishing but he is no longer able to do it because of his inability to drive and his lack of stamina. Pt reports his stress level is moderate. Areas of stress/anxiety include Health.  Pt does exhibit signs of depression. PHQ2/9 score 3/5. Pt shows fair  coping skills with positive outlook. He is offered emotional support and reassurance. Will continue to monitor and evaluate progress toward psychosocial goal(s) of keeping a positive outlook. Physical assessment reveals heart rate is normal, breath sounds clear to auscultation, no wheezes, rales, or rhonchi. Grip strength equal, strong. Distal pulses palpable. Patient reports he does take medications as prescribed. Patient states he follows a Regular diet. The patient reports no specific efforts to gain or lose weight.. Patient's weight will be monitored closely. Demonstration and practice of PLB using pulse oximeter. Patient able to return demonstration satisfactorily. Safety and hand hygiene in the exercise area reviewed with patient. Patient voices understanding of the information reviewed. Department expectations discussed with patient and achievable goals were set. The patient shows enthusiasm about attending the program and we look forward to working with this nice gentleman. The patient is scheduled for a 6 min walk test on 11/04/17 and to begin  exercise on 01/11/18 at 1:30.   45 minutes was spent on a variety of activities such as assessment of the patient, obtaining baseline data including height, weight, BMI, and grip strength, verifying medical history, allergies, and current medications, and teaching patient strategies for performing tasks with less respiratory effort with emphasis on pursed lip breathing.

## 2017-10-30 ENCOUNTER — Encounter (HOSPITAL_COMMUNITY): Payer: Self-pay | Admitting: *Deleted

## 2017-10-31 DIAGNOSIS — Z136 Encounter for screening for cardiovascular disorders: Secondary | ICD-10-CM | POA: Diagnosis not present

## 2017-10-31 DIAGNOSIS — F39 Unspecified mood [affective] disorder: Secondary | ICD-10-CM | POA: Diagnosis not present

## 2017-10-31 DIAGNOSIS — N400 Enlarged prostate without lower urinary tract symptoms: Secondary | ICD-10-CM | POA: Diagnosis not present

## 2017-10-31 DIAGNOSIS — R7303 Prediabetes: Secondary | ICD-10-CM | POA: Diagnosis not present

## 2017-10-31 DIAGNOSIS — Z0000 Encounter for general adult medical examination without abnormal findings: Secondary | ICD-10-CM | POA: Diagnosis not present

## 2017-10-31 DIAGNOSIS — Z23 Encounter for immunization: Secondary | ICD-10-CM | POA: Diagnosis not present

## 2017-10-31 DIAGNOSIS — J449 Chronic obstructive pulmonary disease, unspecified: Secondary | ICD-10-CM | POA: Diagnosis not present

## 2017-10-31 DIAGNOSIS — I10 Essential (primary) hypertension: Secondary | ICD-10-CM | POA: Diagnosis not present

## 2017-10-31 DIAGNOSIS — I471 Supraventricular tachycardia: Secondary | ICD-10-CM | POA: Diagnosis not present

## 2017-10-31 NOTE — Progress Notes (Signed)
[place] 67 y.o. male  DOB: 1951/06/10 MRN: [id number]           Nutrition Brief Note Dx: Emphysema Past Medical History:  Diagnosis Date  . Arthritis   . Cervical disc herniation   . COPD (chronic obstructive pulmonary disease) ([place])   . Headache    " due to antibiotics"  . Hepatitis C   . Hypertension    Meds reviewed. Deltasone noted  Ht: Ht Readings from Last 1 Encounters:  10/27/17 5' 10.5" (1.791 m)    Wt:  Wt Readings from Last 3 Encounters:  10/27/17 149 lb 4 oz (67.7 kg)  10/01/17 140 lb (63.5 kg)  08/22/17 148 lb 12.8 oz (67.5 kg)   BMI: 20.09    Current tobacco use? No  Labs:  Lipid Panel  No results found for: CHOL, TRIG, HDL, CHOLHDL, VLDL, LDLCALC, LDLDIRECT  Lab Results  Component Value Date   HGBA1C 5.9 (H) 06/14/2017   Per nutrition screen:  Pt with difficulty buying food, needs help with grocery shopping/cooking, and has difficulty chewing due to loss of teeth.   Nutrition Diagnosis ? Food-and nutrition-related knowledge deficit related to lack of exposure to information as related to diagnosis of pulmonary disease  Goal(s) 1. To be determined with pt.  Plan:  Pt to attend Pulmonary Nutrition class Will provide client-centered nutrition education as part of interdisciplinary care.   Monitor and evaluate progress toward nutrition goal with team.  Monitor and Evaluate progress toward nutrition goal with team.   Derek Mound, M.Ed, RD, LDN, CDE 10/31/2017 2:18 PM

## 2017-11-04 ENCOUNTER — Encounter (HOSPITAL_COMMUNITY)
Admission: RE | Admit: 2017-11-04 | Discharge: 2017-11-04 | Disposition: A | Discharge status: Home / Self Care | Payer: 59 | Source: Ambulatory Visit | Attending: Internal Medicine | Admitting: Internal Medicine

## 2017-11-04 DIAGNOSIS — J438 Other emphysema: Secondary | ICD-10-CM | POA: Diagnosis not present

## 2017-11-10 ENCOUNTER — Telehealth: Payer: Self-pay | Admitting: Internal Medicine

## 2017-11-10 MED ORDER — AZITHROMYCIN 250 MG PO TABS: ORAL_TABLET | ORAL | 0 refills | Status: DC

## 2017-11-10 NOTE — Telephone Encounter (Signed)
Pt is returning call. Cb is [phone]. [facility]

## 2017-11-10 NOTE — Telephone Encounter (Signed)
Called pt who states he has chest congestion and is coughing up brown mucus.    Pt denies any fever.  Sending this to DOD due to MR being off today.  Dr. Elsworth Soho, please advise on recommendations for pt. Thanks!  Allergies  Allergen Reactions  . Propoxyphene Nausea And Vomiting  . Amoxicillin Nausea And Vomiting  . Ciprofloxacin Nausea Only  . Depakote [Divalproex Sodium] Other (See Comments)    hallucinations

## 2017-11-10 NOTE — Telephone Encounter (Signed)
Spoke with pt, aware of recs.  rx sent to preferred pharmacy.  Nothing further needed.  

## 2017-11-10 NOTE — Telephone Encounter (Signed)
lmtcb x1 for pt. 

## 2017-11-10 NOTE — Telephone Encounter (Signed)
Z pak

## 2017-11-11 ENCOUNTER — Encounter (HOSPITAL_COMMUNITY): Payer: 59

## 2017-11-13 ENCOUNTER — Telehealth (HOSPITAL_COMMUNITY): Payer: Self-pay | Admitting: Family Medicine

## 2017-11-13 ENCOUNTER — Encounter (HOSPITAL_COMMUNITY): Payer: 59

## 2017-11-14 DIAGNOSIS — J449 Chronic obstructive pulmonary disease, unspecified: Secondary | ICD-10-CM | POA: Diagnosis not present

## 2017-11-14 DIAGNOSIS — L0390 Cellulitis, unspecified: Secondary | ICD-10-CM | POA: Diagnosis not present

## 2017-11-14 DIAGNOSIS — L0291 Cutaneous abscess, unspecified: Secondary | ICD-10-CM | POA: Diagnosis not present

## 2017-11-14 DIAGNOSIS — R2681 Unsteadiness on feet: Secondary | ICD-10-CM | POA: Diagnosis not present

## 2017-11-14 DIAGNOSIS — J189 Pneumonia, unspecified organism: Secondary | ICD-10-CM | POA: Diagnosis not present

## 2017-11-14 DIAGNOSIS — I5030 Unspecified diastolic (congestive) heart failure: Secondary | ICD-10-CM | POA: Diagnosis not present

## 2017-11-17 ENCOUNTER — Encounter: Payer: Self-pay | Admitting: Pulmonary Disease

## 2017-11-17 ENCOUNTER — Ambulatory Visit (INDEPENDENT_AMBULATORY_CARE_PROVIDER_SITE_OTHER)
Admission: RE | Admit: 2017-11-17 | Discharge: 2017-11-17 | Disposition: A | Discharge status: Home / Self Care | Payer: 59 | Source: Ambulatory Visit | Attending: Pulmonary Disease | Admitting: Pulmonary Disease

## 2017-11-17 ENCOUNTER — Ambulatory Visit (INDEPENDENT_AMBULATORY_CARE_PROVIDER_SITE_OTHER): Payer: 59 | Admitting: Pulmonary Disease

## 2017-11-17 VITALS — BP 108/64 | HR 76 | Ht 70.0 in | Wt 143.0 lb

## 2017-11-17 DIAGNOSIS — R042 Hemoptysis: Secondary | ICD-10-CM

## 2017-11-17 DIAGNOSIS — R05 Cough: Secondary | ICD-10-CM | POA: Diagnosis not present

## 2017-11-17 MED ORDER — DOXYCYCLINE HYCLATE 100 MG PO TABS: 100.0000 mg | ORAL_TABLET | Freq: Two times a day (BID) | ORAL | 0 refills | Status: DC

## 2017-11-17 MED ORDER — BENZONATATE 200 MG PO CAPS: 200.0000 mg | ORAL_CAPSULE | Freq: Three times a day (TID) | ORAL | 1 refills | Status: DC | PRN

## 2017-11-17 NOTE — Progress Notes (Signed)
Chief Complaint  Patient presents with  . Follow-up    Pt has productive cough-brown, and coughed up blood this morning size of a penny. Pt is wanting to know if he can start pulmonary rehab; he could not go due to the coughing.    HPI: 67 yo male former smoker with COPD/emphysema and pulmonary fibrosis.  He is followed by Dr. Chase Caller.  He is here for an acute visit.  He developed a cough with chest congestion a few weeks ago.  He has been bringing up clear to yellow sputum.  He was feeling tight in his chest.  He has some sinus congestion.  He was treatment with zithromax, but symptoms persist.  He doesn't think he had fever.  He denies nausea, but had some loose stools recently.  He is not having sweats, skin rash, or swelling.    PMHx: He  has a past medical history of Arthritis, Cervical disc herniation, COPD (chronic obstructive pulmonary disease) ([place]), Headache, Hepatitis C, and Hypertension.  PSHx: He  has a past surgical history that includes Incision and drainage abscess (Right, 11/12/2013); Knee surgery; I&D extremity (Left, 11/03/2016); I&D extremity (Left, 12/05/2016); Application of a-cell of extremity (Left, 12/05/2016); Application if wound vac (Left, 12/05/2016); and Incision and drainage of wound (Left, 01/29/2017).  FHx: His family history includes Heart disease in his father and mother; Stroke in his father and mother.  SHx: He  reports that he quit smoking about 17 years ago. His smoking use included cigarettes. He has a 12.50 pack-year smoking history. he has never used smokeless tobacco. He reports that he uses drugs. Drugs: Heroin and Cocaine. He reports that he does not drink alcohol.   Allergies as of 11/17/2017      Reactions   Propoxyphene Nausea And Vomiting   Amoxicillin Nausea And Vomiting   Ciprofloxacin Nausea Only   Depakote [divalproex Sodium] Other (See Comments)   hallucinations      Medication List        Accurate as of 11/17/17  2:53 PM. Always  use your most recent med list.          acetaminophen 325 MG tablet Commonly known as:  TYLENOL Take 2 tablets (650 mg total) by mouth every 6 (six) hours as needed for mild pain (or Fever >/= 101).   albuterol (2.5 MG/3ML) 0.083% nebulizer solution Commonly known as:  PROVENTIL Take 3 mLs (2.5 mg total) by nebulization every 2 (two) hours as needed for wheezing or shortness of breath.   ARIPiprazole 15 MG tablet Commonly known as:  ABILIFY   aspirin 81 MG EC tablet Take 1 tablet (81 mg total) by mouth daily.   atenolol 25 MG tablet Commonly known as:  TENORMIN Take 1 tablet (25 mg total) by mouth every morning.   benzonatate 200 MG capsule Commonly known as:  TESSALON Take 1 capsule (200 mg total) by mouth 3 (three) times daily as needed for cough.   clonazePAM 0.5 MG tablet Commonly known as:  KLONOPIN Take 1 tablet (0.5 mg total) by mouth 2 (two) times daily.   doxycycline 100 MG tablet Commonly known as:  VIBRA-TABS Take 1 tablet (100 mg total) by mouth 2 (two) times daily.   feeding supplement (ENSURE ENLIVE) Liqd Take 237 mLs by mouth 2 (two) times daily between meals.   FLUoxetine 20 MG capsule Commonly known as:  PROZAC Take 1 capsule (20 mg total) by mouth daily.   fluticasone 50 MCG/ACT nasal spray Commonly known as:  FLONASE Place 1 spray into both nostrils every morning.   ibuprofen 600 MG tablet Commonly known as:  ADVIL,MOTRIN Take 1 tablet (600 mg total) by mouth 4 (four) times daily.   ipratropium-albuterol 0.5-2.5 (3) MG/3ML Soln Commonly known as:  DUONEB Take 3 mLs by nebulization 3 (three) times daily.   lamoTRIgine 200 MG tablet Commonly known as:  LAMICTAL Take 1 tablet (200 mg total) by mouth every morning.   silodosin 8 MG Caps capsule Commonly known as:  RAPAFLO Take 8 mg daily with breakfast by mouth.   Tiotropium Bromide Monohydrate 2.5 MCG/ACT Aers Commonly known as:  SPIRIVA RESPIMAT Inhale 2 puffs into the lungs daily.        Vital signs: BP 108/64 (BP Location: Left Arm, Cuff Size: Normal)   Pulse 76   Ht 5\' 10"  (1.778 m)   Wt 143 lb (64.9 kg)   SpO2 98%   BMI 20.52 kg/m   Physical exam:  General - thin Eyes - pupils reactive, wearing glasses ENT - no sinus tenderness, no oral exudate, no LAN Cardiac - regular, no murmur Chest - no wheeze, rales, distant breath sounds Abd - soft, non tender Ext - no edema Skin - no rashes Neuro - normal strength Psych - normal mood  Assessment/plan:  Hemoptysis in setting of COPD exacerbation. - will give him additional course of Abx with doxycycline - don't think he needs prednisone at this time - check chest xray - he can use tessalon and mucinex to help with his cough - continue spiriva with prn nebulizer tx - advised him to avoid ASA or NSAIDs until hemoptysis improved - advised him to hold off on going to rehab until his symptoms have cleared   Patient Instructions  Chest xray today  Doxycycline 100 mg pill twice per day  Tessalon 1 pill every 8 hours as needed for cough  Can try mucinex to help with cough also  Follow up in 3 months with Dr. Chase Caller

## 2017-11-17 NOTE — Patient Instructions (Addendum)
Chest xray today  Doxycycline 100 mg pill twice per day  Tessalon 1 pill every 8 hours as needed for cough  Can try mucinex to help with cough also  Follow up in 3 months with Dr. Chase Caller

## 2017-11-18 ENCOUNTER — Telehealth (HOSPITAL_COMMUNITY): Payer: Self-pay | Admitting: Family Medicine

## 2017-11-18 ENCOUNTER — Telehealth: Payer: Self-pay | Admitting: Pulmonary Disease

## 2017-11-18 ENCOUNTER — Encounter (HOSPITAL_COMMUNITY)
Admission: RE | Admit: 2017-11-18 | Discharge: 2017-11-18 | Disposition: A | Discharge status: Home / Self Care | Payer: 59 | Source: Ambulatory Visit | Attending: Internal Medicine | Admitting: Internal Medicine

## 2017-11-18 DIAGNOSIS — J438 Other emphysema: Secondary | ICD-10-CM | POA: Insufficient documentation

## 2017-11-18 NOTE — Telephone Encounter (Signed)
Dg Chest 2 View  Result Date: 11/18/2017 CLINICAL DATA:  Cough.  Hemoptysis. EXAM: CHEST - 2 VIEW COMPARISON:  CT 09/19/2017. Chest x-ray 05/27/2017. Chest x-ray 06/04/2014 FINDINGS: Mediastinum hilar structures normal. Lungs are clear of acute infiltrates. Changes again noted consistent with chronic interstitial lung disease and COPD. Prior exam no acute bony abnormality. IMPRESSION: Changes of chronic interstitial lung disease and COPD again noted. No acute cardiopulmonary disease. Electronically Signed   By: Marcello Moores  Register   On: 11/18/2017 08:10    Please let him know that his chest xray didn't show any signs of pneumonia.  He should complete his course of antibiotics for bronchitis.

## 2017-11-20 ENCOUNTER — Encounter (HOSPITAL_COMMUNITY): Payer: 59

## 2017-11-20 NOTE — Telephone Encounter (Signed)
Called and spoke with patient's wife regarding results.  Informed the patient of results and recommendations today. Pt verbalized understanding and denied any questions or concerns at this time.  Nothing further needed.

## 2017-11-25 ENCOUNTER — Encounter (HOSPITAL_COMMUNITY)
Admission: RE | Admit: 2017-11-25 | Discharge: 2017-11-25 | Disposition: A | Discharge status: Home / Self Care | Payer: 59 | Source: Ambulatory Visit | Attending: Internal Medicine | Admitting: Internal Medicine

## 2017-11-25 VITALS — Wt 145.3 lb

## 2017-11-25 DIAGNOSIS — J438 Other emphysema: Secondary | ICD-10-CM | POA: Diagnosis not present

## 2017-11-25 NOTE — Progress Notes (Signed)
Daily Session Note  Patient Details  Name: Travis Salinas MRN: [id number] Date of Birth: Jul 21, 1951 Referring Provider:     Pulmonary Rehab Walk Test from 11/04/2017 in [place]  Referring Provider  Dr. Chase Caller      Encounter Date: 11/25/2017  Check In: Session Check In - 11/25/17 1330      Check-In   Location  [facility]    Staff Present  Trish Fountain, RN, BSN;Molly diVincenzo, MS, ACSM RCEP, Exercise Physiologist;Joan Leonia Reeves, RN, BSN    Supervising physician immediately available to respond to emergencies  [facility] immediately available    Physician(s)  Dr. Wendee Beavers    Medication changes reported      No    Fall or balance concerns reported     No    Tobacco Cessation  No Change    Warm-up and Cool-down  Performed as group-led instruction    Resistance Training Performed  Yes    VAD Patient?  No      Pain Assessment   Currently in Pain?  No/denies    Multiple Pain Sites  No       Capillary Blood Glucose: No results found for this or any previous visit (from the past 24 hour(s)).    Social History   Tobacco Use  Smoking Status Former Smoker  . Packs/day: 0.50  . Years: 25.00  . Pack years: 12.50  . Types: Cigarettes  . Last attempt to quit: 2002  . Years since quitting: 17.2  Smokeless Tobacco Never Used    Goals Met:  Exercise tolerated well Queuing for purse lip breathing No report of cardiac concerns or symptoms Strength training completed today  Goals Unmet:  Not Applicable  Comments: Service time is from 1330 to 1515   Dr. Rush Farmer is Medical Director for Pulmonary Rehab at [facility].

## 2017-11-27 ENCOUNTER — Encounter (HOSPITAL_COMMUNITY): Payer: 59

## 2017-11-27 ENCOUNTER — Telehealth (HOSPITAL_COMMUNITY): Payer: Self-pay | Admitting: Family Medicine

## 2017-11-27 NOTE — Progress Notes (Signed)
Daily Session Note  Patient Details  Name: Travis Salinas MRN: [id number] Date of Birth: 07/18/1951 Referring Provider:     Pulmonary Rehab Walk Test from 11/04/2017 in [place]  Referring Provider  Dr. Chase Caller      Encounter Date: 11/25/2017  Check In:   Capillary Blood Glucose: No results found for this or any previous visit (from the past 24 hour(s)).    Social History   Tobacco Use  Smoking Status Former Smoker  . Packs/day: 0.50  . Years: 25.00  . Pack years: 12.50  . Types: Cigarettes  . Last attempt to quit: 2002  . Years since quitting: 17.2  Smokeless Tobacco Never Used    Goals Met:  Exercise tolerated well No report of cardiac concerns or symptoms Strength training completed today  Goals Unmet:  Not Applicable  Comments: Service time is from 1:30p to 3:15p    Dr. Rush Farmer is Medical Director for Pulmonary Rehab at [facility].

## 2017-12-02 ENCOUNTER — Encounter (HOSPITAL_COMMUNITY)
Admission: RE | Admit: 2017-12-02 | Discharge: 2017-12-02 | Disposition: A | Discharge status: Home / Self Care | Payer: 59 | Source: Ambulatory Visit | Attending: Internal Medicine | Admitting: Internal Medicine

## 2017-12-02 DIAGNOSIS — J438 Other emphysema: Secondary | ICD-10-CM | POA: Diagnosis not present

## 2017-12-02 NOTE — Progress Notes (Signed)
Daily Session Note  Patient Details  Name: Finas Delone MRN: [id number] Date of Birth: 02-09-1951 Referring Provider:     Pulmonary Rehab Walk Test from 11/04/2017 in [place]  Referring Provider  Dr. Chase Caller      Encounter Date: 12/02/2017  Check In: Session Check In - 12/02/17 1332      Check-In   Location  [facility]    Staff Present  Trish Fountain, RN, BSN;Molly diVincenzo, MS, ACSM RCEP, Exercise Physiologist    Supervising physician immediately available to respond to emergencies  [facility] immediately available    Physician(s)  Dr. Lonny Prude    Medication changes reported      No    Fall or balance concerns reported     No    Tobacco Cessation  No Change    Warm-up and Cool-down  Performed as group-led instruction    Resistance Training Performed  Yes    VAD Patient?  No      Pain Assessment   Currently in Pain?  No/denies    Multiple Pain Sites  No       Capillary Blood Glucose: No results found for this or any previous visit (from the past 24 hour(s)).    Social History   Tobacco Use  Smoking Status Former Smoker  . Packs/day: 0.50  . Years: 25.00  . Pack years: 12.50  . Types: Cigarettes  . Last attempt to quit: 2002  . Years since quitting: 17.2  Smokeless Tobacco Never Used    Goals Met:  Exercise tolerated well Queuing for purse lip breathing No report of cardiac concerns or symptoms Strength training completed today  Goals Unmet:  Not Applicable  Comments: Service time is from 1330 to 1515   Dr. Rush Farmer is Medical Director for Pulmonary Rehab at [facility].

## 2017-12-04 ENCOUNTER — Encounter (HOSPITAL_COMMUNITY)
Admission: RE | Admit: 2017-12-04 | Discharge: 2017-12-04 | Disposition: A | Discharge status: Home / Self Care | Payer: 59 | Source: Ambulatory Visit | Attending: Internal Medicine | Admitting: Internal Medicine

## 2017-12-04 DIAGNOSIS — J438 Other emphysema: Secondary | ICD-10-CM

## 2017-12-04 NOTE — Progress Notes (Addendum)
Daily Session Note  Patient Details  Name: Travis Salinas MRN: [id number] Date of Birth: [birth year] Referring Provider:     Pulmonary Rehab Walk Test from 11/04/2017 in [place]  Referring Provider  Dr. Chase Caller      Encounter Date: 12/04/2017  Check In: Session Check In - 12/04/17 1330      Check-In   Location  [facility]    Staff Present  Trish Fountain, RN, BSN;Molly diVincenzo, MS, ACSM RCEP, Exercise Physiologist;Ramiyah Mcclenahan Ysidro Evert, RN    Supervising physician immediately available to respond to emergencies  [facility] immediately available    Physician(s)  Dr. Eliseo Squires    Medication changes reported      No    Fall or balance concerns reported     No    Tobacco Cessation  No Change    Warm-up and Cool-down  Performed as group-led instruction    Resistance Training Performed  Yes    VAD Patient?  No      Pain Assessment   Currently in Pain?  No/denies    Multiple Pain Sites  No       Capillary Blood Glucose: No results found for this or any previous visit (from the past 24 hour(s)).    Social History   Tobacco Use  Smoking Status Former Smoker  . Packs/day: 0.50  . Years: 25.00  . Pack years: 12.50  . Types: Cigarettes  . Last attempt to quit: 2002  . Years since quitting: 17.2  Smokeless Tobacco Never Used    Goals Met:  Exercise tolerated well No report of cardiac concerns or symptoms Strength training completed today  Goals Unmet:  Not Applicable  Comments: Service time is from 1330 to 1500    Dr. Rush Farmer is Medical Director for Pulmonary Rehab at [facility].

## 2017-12-08 ENCOUNTER — Telehealth: Payer: Self-pay | Admitting: Internal Medicine

## 2017-12-08 NOTE — Telephone Encounter (Signed)
Spoke with pt. States that he is not feeling well. Reports increased coughing, sinus congestion and chest congestion. Cough is producing brown mucus. Denies chest tightness, wheezing, SOB or fever. Symptoms started 2 days ago. He has not tried any OTC medications. Pt would like to have something sent in.  Sarah - please advise as Dr. Geoffery Spruce is not available. Thanks.

## 2017-12-08 NOTE — Telephone Encounter (Signed)
Pt. Called the office 3/4 for the same issues. He was treated with a z pack. As this has not improved after treatment he needs to be seen before any additional medications are prescribed by phone. Thanks so much.

## 2017-12-08 NOTE — Telephone Encounter (Signed)
Spoke with pt. He is aware of Sarah's recommendation. Pt was not happy about this. States he will call us back.

## 2017-12-09 ENCOUNTER — Encounter (HOSPITAL_COMMUNITY)
Admission: RE | Admit: 2017-12-09 | Discharge: 2017-12-09 | Disposition: A | Discharge status: Home / Self Care | Payer: 59 | Source: Ambulatory Visit | Attending: Internal Medicine | Admitting: Internal Medicine

## 2017-12-09 VITALS — Wt 147.9 lb

## 2017-12-09 DIAGNOSIS — J438 Other emphysema: Secondary | ICD-10-CM | POA: Diagnosis not present

## 2017-12-09 NOTE — Progress Notes (Signed)
Daily Session Note  Patient Details  Name: Travis Salinas MRN: [id number] Date of Birth: Feb 14, 1951 Referring Provider:     Pulmonary Rehab Walk Test from 11/04/2017 in [place]  Referring Provider  Dr. Chase Caller      Encounter Date: 12/09/2017  Check In: Session Check In - 12/09/17 1523      Check-In   Location  [facility]    Staff Present  Trish Fountain, RN, BSN;Molly diVincenzo, MS, ACSM RCEP, Exercise Physiologist;Gwendlyn Hanback Ysidro Evert, RN    Supervising physician immediately available to respond to emergencies  [facility] immediately available    Physician(s)  Dr. Posey Pronto    Medication changes reported      No    Fall or balance concerns reported     No    Tobacco Cessation  No Change    Warm-up and Cool-down  Performed as group-led instruction    Resistance Training Performed  Yes    VAD Patient?  No      Pain Assessment   Currently in Pain?  No/denies    Multiple Pain Sites  No       Capillary Blood Glucose: No results found for this or any previous visit (from the past 24 hour(s)).  Exercise Prescription Changes - 12/09/17 1500      Response to Exercise   Blood Pressure (Admit)  140/70    Blood Pressure (Exercise)  144/80    Blood Pressure (Exit)  130/80    Heart Rate (Admit)  68 bpm    Heart Rate (Exercise)  83 bpm    Heart Rate (Exit)  71 bpm    Oxygen Saturation (Admit)  98 %    Oxygen Saturation (Exercise)  96 %    Oxygen Saturation (Exit)  97 %    Rating of Perceived Exertion (Exercise)  13    Perceived Dyspnea (Exercise)  1    Duration  Continue with 45 min of aerobic exercise without signs/symptoms of physical distress.    Intensity  THRR unchanged      Progression   Progression  Continue to progress workloads to maintain intensity without signs/symptoms of physical distress.      Resistance Training   Training Prescription  Yes    Weight  orange bands    Reps  10-15    Time  10 Minutes      Bike   Level  5    Minutes  17      NuStep   Level  3    Minutes  17    METs  2      Track   Laps  24    Minutes  17       Social History   Tobacco Use  Smoking Status Former Smoker  . Packs/day: 0.50  . Years: 25.00  . Pack years: 12.50  . Types: Cigarettes  . Last attempt to quit: 2002  . Years since quitting: 17.2  Smokeless Tobacco Never Used    Goals Met:  Exercise tolerated well No report of cardiac concerns or symptoms Strength training completed today  Goals Unmet:  Not Applicable  Comments: Service time is from 1330 to 1515    Dr. Rush Farmer is Medical Director for Pulmonary Rehab at [facility].

## 2017-12-11 ENCOUNTER — Encounter (HOSPITAL_COMMUNITY): Payer: 59

## 2017-12-11 ENCOUNTER — Telehealth (HOSPITAL_COMMUNITY): Payer: Self-pay

## 2017-12-11 NOTE — Telephone Encounter (Signed)
Patient called and stated he is not attending class today due to not feeling well.

## 2017-12-15 DIAGNOSIS — L0390 Cellulitis, unspecified: Secondary | ICD-10-CM | POA: Diagnosis not present

## 2017-12-15 DIAGNOSIS — I5030 Unspecified diastolic (congestive) heart failure: Secondary | ICD-10-CM | POA: Diagnosis not present

## 2017-12-15 DIAGNOSIS — J189 Pneumonia, unspecified organism: Secondary | ICD-10-CM | POA: Diagnosis not present

## 2017-12-15 DIAGNOSIS — J449 Chronic obstructive pulmonary disease, unspecified: Secondary | ICD-10-CM | POA: Diagnosis not present

## 2017-12-15 DIAGNOSIS — R2681 Unsteadiness on feet: Secondary | ICD-10-CM | POA: Diagnosis not present

## 2017-12-15 DIAGNOSIS — L0291 Cutaneous abscess, unspecified: Secondary | ICD-10-CM | POA: Diagnosis not present

## 2017-12-15 NOTE — Progress Notes (Signed)
Pulmonary Individual Treatment Plan  Patient Details  Name: Nasean Zapf MRN: [id number] Date of Birth: 04/11/1951 Referring Provider:     Pulmonary Rehab Walk Test from 11/04/2017 in [place]  Referring Provider  Dr. Chase Caller      Initial Encounter Date:    Pulmonary Rehab Walk Test from 11/04/2017 in [place]  Date  11/06/17  Referring Provider  Dr. Chase Caller      Visit Diagnosis: Other emphysema ([place])  Patient's Home Medications on Admission:   Current Outpatient Medications:  .  acetaminophen (TYLENOL) 325 MG tablet, Take 2 tablets (650 mg total) by mouth every 6 (six) hours as needed for mild pain (or Fever >/= 101)., Disp: , Rfl:  .  albuterol (PROVENTIL) (2.5 MG/3ML) 0.083% nebulizer solution, Take 3 mLs (2.5 mg total) by nebulization every 2 (two) hours as needed for wheezing or shortness of breath., Disp: 75 mL, Rfl: 12 .  ARIPiprazole (ABILIFY) 15 MG tablet, , Disp: , Rfl: 3 .  aspirin EC 81 MG EC tablet, Take 1 tablet (81 mg total) by mouth daily., Disp: , Rfl:  .  atenolol (TENORMIN) 25 MG tablet, Take 1 tablet (25 mg total) by mouth every morning., Disp: , Rfl:  .  benzonatate (TESSALON) 200 MG capsule, Take 1 capsule (200 mg total) by mouth 3 (three) times daily as needed for cough., Disp: 30 capsule, Rfl: 1 .  clonazePAM (KLONOPIN) 0.5 MG tablet, Take 1 tablet (0.5 mg total) by mouth 2 (two) times daily. (Patient taking differently: Take 1 mg by mouth 2 (two) times daily. ), Disp: 30 tablet, Rfl: 0 .  doxycycline (VIBRA-TABS) 100 MG tablet, Take 1 tablet (100 mg total) by mouth 2 (two) times daily., Disp: 14 tablet, Rfl: 0 .  feeding supplement, ENSURE ENLIVE, (ENSURE ENLIVE) LIQD, Take 237 mLs by mouth 2 (two) times daily between meals., Disp: 237 mL, Rfl: 12 .  FLUoxetine (PROZAC) 20 MG capsule, Take 1 capsule (20 mg total) by mouth daily. (Patient taking differently: Take 10 mg by mouth daily. ),  Disp: , Rfl: 3 .  fluticasone (FLONASE) 50 MCG/ACT nasal spray, Place 1 spray into both nostrils every morning., Disp: , Rfl: 0 .  ibuprofen (ADVIL,MOTRIN) 600 MG tablet, Take 1 tablet (600 mg total) by mouth 4 (four) times daily., Disp: 30 tablet, Rfl: 0 .  ipratropium-albuterol (DUONEB) 0.5-2.5 (3) MG/3ML SOLN, Take 3 mLs by nebulization 3 (three) times daily., Disp: 360 mL, Rfl:  .  lamoTRIgine (LAMICTAL) 200 MG tablet, Take 1 tablet (200 mg total) by mouth every morning., Disp: , Rfl:  .  silodosin (RAPAFLO) 8 MG CAPS capsule, Take 8 mg daily with breakfast by mouth., Disp: , Rfl:  .  Tiotropium Bromide Monohydrate (SPIRIVA RESPIMAT) 2.5 MCG/ACT AERS, Inhale 2 puffs into the lungs daily., Disp: 1 Inhaler, Rfl: 5  Past Medical History: Past Medical History:  Diagnosis Date  . Arthritis   . Cervical disc herniation   . COPD (chronic obstructive pulmonary disease) ([place])   . Headache    " due to antibiotics"  . Hepatitis C   . Hypertension     Tobacco Use: Social History   Tobacco Use  Smoking Status Former Smoker  . Packs/day: 0.50  . Years: 25.00  . Pack years: 12.50  . Types: Cigarettes  . Last attempt to quit: 2002  . Years since quitting: 17.2  Smokeless Tobacco Never Used    Labs: Recent Review Flowsheet  Data    Labs for ITP Cardiac and Pulmonary Rehab Latest Ref Rng & Units 05/27/2017 05/27/2017 05/28/2017 05/31/2017 06/14/2017   Hemoglobin A1c 4.8 - 5.6 % - - - - 5.9(H)   PHART 7.350 - 7.450 7.461(H) 7.442 7.408 7.467(H) -   PCO2ART 32.0 - 48.0 mmHg 28.2(L) 29.2(L) 36.4 35.3 -   HCO3 20.0 - 28.0 mmol/L 19.8(L) 19.6(L) 22.6 25.1 -   TCO2 0 - 100 mmol/L - - - - -   ACIDBASEDEF 0.0 - 2.0 mmol/L 2.6(H) 3.2(H) 1.3 - -   O2SAT % 89.1 82.5 89.1 98.7 -      Capillary Blood Glucose: No results found for: GLUCAP   Pulmonary Assessment Scores: Pulmonary Assessment Scores    Row Name 10/30/17 1204 11/06/17 1310       ADL UCSD   ADL Phase  Entry  Entry    SOB Score  total  37  -      CAT Score   CAT Score  11 Entry  -      mMRC Score   mMRC Score  -  0       Pulmonary Function Assessment: Pulmonary Function Assessment - 10/27/17 1511      Breath   Bilateral Breath Sounds  Clear    Shortness of Breath  Yes;Limiting activity       Exercise Target Goals:    Exercise Program Goal: Individual exercise prescription set using results from initial 6 min walk test and THRR while considering  patient's activity barriers and safety.    Exercise Prescription Goal: Initial exercise prescription builds to 30-45 minutes a day of aerobic activity, 2-3 days per week.  Home exercise guidelines will be given to patient during program as part of exercise prescription that the participant will acknowledge.  Activity Barriers & Risk Stratification: Activity Barriers & Cardiac Risk Stratification - 10/27/17 1432      Activity Barriers & Cardiac Risk Stratification   Activity Barriers  Deconditioning;Muscular Weakness;Shortness of Breath       6 Minute Walk: 6 Minute Walk    Row Name 11/06/17 1311         6 Minute Walk   Phase  Initial     Distance  1600 feet     Walk Time  6 minutes     # of Rest Breaks  0     MPH  3.03     METS  3.3     RPE  11     Perceived Dyspnea   0     Symptoms  No     Resting HR  70 bpm     Resting BP  136/80     Resting Oxygen Saturation   94 %     Exercise Oxygen Saturation  during 6 min walk  92 %     Max Ex. HR  97 bpm     Max Ex. BP  154/68       Interval HR   1 Minute HR  77     2 Minute HR  88     3 Minute HR  97     4 Minute HR  93     5 Minute HR  94     6 Minute HR  93     2 Minute Post HR  79     Interval Heart Rate?  Yes       Interval Oxygen   Interval Oxygen?  Yes  Baseline Oxygen Saturation %  94 %     1 Minute Oxygen Saturation %  92 %     1 Minute Liters of Oxygen  0 L     2 Minute Oxygen Saturation %  93 %     2 Minute Liters of Oxygen  0 L     3 Minute Oxygen Saturation %  92 %      3 Minute Liters of Oxygen  0 L     4 Minute Oxygen Saturation %  94 %     4 Minute Liters of Oxygen  0 L     5 Minute Oxygen Saturation %  94 %     5 Minute Liters of Oxygen  0 L     6 Minute Oxygen Saturation %  95 %     6 Minute Liters of Oxygen  0 L     2 Minute Post Oxygen Saturation %  96 %     2 Minute Post Liters of Oxygen  0 L        Oxygen Initial Assessment: Oxygen Initial Assessment - 11/06/17 1309      Initial 6 min Walk   Oxygen Used  None      Program Oxygen Prescription   Program Oxygen Prescription  None       Oxygen Re-Evaluation: Oxygen Re-Evaluation    Row Name 11/17/17 1140 12/12/17 1514           Program Oxygen Prescription   Program Oxygen Prescription  None  None        Home Oxygen   Home Oxygen Device  Home Concentrator;E-Tanks  Home Concentrator;E-Tanks      Sleep Oxygen Prescription  Continuous  Continuous      Liters per minute  2  2      Home Exercise Oxygen Prescription  None  None      Home at Rest Exercise Oxygen Prescription  None  None      Compliance with Home Oxygen Use  Yes  Yes        Goals/Expected Outcomes   Short Term Goals  To learn and exhibit compliance with exercise, home and travel O2 prescription;To learn and understand importance of monitoring SPO2 with pulse oximeter and demonstrate accurate use of the pulse oximeter.;To learn and understand importance of maintaining oxygen saturations>88%;To learn and demonstrate proper pursed lip breathing techniques or other breathing techniques.;To learn and demonstrate proper use of respiratory medications  To learn and exhibit compliance with exercise, home and travel O2 prescription;To learn and understand importance of monitoring SPO2 with pulse oximeter and demonstrate accurate use of the pulse oximeter.;To learn and understand importance of maintaining oxygen saturations>88%;To learn and demonstrate proper pursed lip breathing techniques or other breathing techniques.;To learn  and demonstrate proper use of respiratory medications      Long  Term Goals  Exhibits compliance with exercise, home and travel O2 prescription;Verbalizes importance of monitoring SPO2 with pulse oximeter and return demonstration;Maintenance of O2 saturations>88%;Exhibits proper breathing techniques, such as pursed lip breathing or other method taught during program session;Compliance with respiratory medication;Demonstrates proper use of MDI's  Exhibits compliance with exercise, home and travel O2 prescription;Verbalizes importance of monitoring SPO2 with pulse oximeter and return demonstration;Maintenance of O2 saturations>88%;Exhibits proper breathing techniques, such as pursed lip breathing or other method taught during program session;Compliance with respiratory medication;Demonstrates proper use of MDI's      Goals/Expected Outcomes  -  Patient to maintain compliance with night o2  Oxygen Discharge (Final Oxygen Re-Evaluation): Oxygen Re-Evaluation - 12/12/17 1514      Program Oxygen Prescription   Program Oxygen Prescription  None      Home Oxygen   Home Oxygen Device  Home Concentrator;E-Tanks    Sleep Oxygen Prescription  Continuous    Liters per minute  2    Home Exercise Oxygen Prescription  None    Home at Rest Exercise Oxygen Prescription  None    Compliance with Home Oxygen Use  Yes      Goals/Expected Outcomes   Short Term Goals  To learn and exhibit compliance with exercise, home and travel O2 prescription;To learn and understand importance of monitoring SPO2 with pulse oximeter and demonstrate accurate use of the pulse oximeter.;To learn and understand importance of maintaining oxygen saturations>88%;To learn and demonstrate proper pursed lip breathing techniques or other breathing techniques.;To learn and demonstrate proper use of respiratory medications    Long  Term Goals  Exhibits compliance with exercise, home and travel O2 prescription;Verbalizes importance of  monitoring SPO2 with pulse oximeter and return demonstration;Maintenance of O2 saturations>88%;Exhibits proper breathing techniques, such as pursed lip breathing or other method taught during program session;Compliance with respiratory medication;Demonstrates proper use of MDI's    Goals/Expected Outcomes  Patient to maintain compliance with night o2       Initial Exercise Prescription: Initial Exercise Prescription - 11/06/17 1300      Date of Initial Exercise RX and Referring Provider   Date  11/06/17    Referring Provider  Dr. Chase Caller      Bike   Level  0.6    Minutes  17      NuStep   Level  2    SPM  80    Minutes  17    METs  1.5      Track   Laps  10    Minutes  17      Prescription Details   Frequency (times per week)  2    Duration  Progress to 45 minutes of aerobic exercise without signs/symptoms of physical distress      Intensity   THRR 40-80% of Max Heartrate  62-124    Ratings of Perceived Exertion  11-13    Perceived Dyspnea  0-4      Progression   Progression  Continue progressive overload as per policy without signs/symptoms or physical distress.      Resistance Training   Training Prescription  Yes    Weight  orange bands    Reps  10-15       Perform Capillary Blood Glucose checks as needed.  Exercise Prescription Changes: Exercise Prescription Changes    Row Name 11/25/17 1414 12/09/17 1500           Response to Exercise   Blood Pressure (Admit)  112/72  140/70      Blood Pressure (Exercise)  150/76  144/80      Blood Pressure (Exit)  110/72  130/80      Heart Rate (Admit)  75 bpm  68 bpm      Heart Rate (Exercise)  104 bpm  83 bpm      Heart Rate (Exit)  76 bpm  71 bpm      Oxygen Saturation (Admit)  95 %  98 %      Oxygen Saturation (Exercise)  93 %  96 %      Oxygen Saturation (Exit)  97 %  97 %  Rating of Perceived Exertion (Exercise)  13  13      Perceived Dyspnea (Exercise)  1  1      Duration  Continue with 45 min of  aerobic exercise without signs/symptoms of physical distress.  Continue with 45 min of aerobic exercise without signs/symptoms of physical distress.      Intensity  THRR unchanged  THRR unchanged        Progression   Progression  -  Continue to progress workloads to maintain intensity without signs/symptoms of physical distress.        Resistance Training   Training Prescription  Yes  Yes      Weight  orange bands  orange bands      Reps  10-15  10-15      Time  -  10 Minutes        Bike   Level  0.6  5      Minutes  17  17        NuStep   Level  2  3      SPM  80  -      Minutes  17  17      METs  1.5  2        Track   Laps  14  24      Minutes  17  17         Exercise Comments:   Exercise Goals and Review: Exercise Goals    Row Name 10/27/17 1434             Exercise Goals   Increase Physical Activity  Yes       Intervention  Provide advice, education, support and counseling about physical activity/exercise needs.;Develop an individualized exercise prescription for aerobic and resistive training based on initial evaluation findings, risk stratification, comorbidities and participant's personal goals.       Expected Outcomes  Short Term: Attend rehab on a regular basis to increase amount of physical activity.;Long Term: Exercising regularly at least 3-5 days a week.;Long Term: Add in home exercise to make exercise part of routine and to increase amount of physical activity.       Increase Strength and Stamina  Yes       Intervention  Provide advice, education, support and counseling about physical activity/exercise needs.;Develop an individualized exercise prescription for aerobic and resistive training based on initial evaluation findings, risk stratification, comorbidities and participant's personal goals.       Expected Outcomes  Short Term: Increase workloads from initial exercise prescription for resistance, speed, and METs.;Short Term: Perform resistance training  exercises routinely during rehab and add in resistance training at home;Long Term: Improve cardiorespiratory fitness, muscular endurance and strength as measured by increased METs and functional capacity (6MWT)       Able to understand and use rate of perceived exertion (RPE) scale  Yes       Intervention  Provide education and explanation on how to use RPE scale       Expected Outcomes  Short Term: Able to use RPE daily in rehab to express subjective intensity level;Long Term:  Able to use RPE to guide intensity level when exercising independently       Able to understand and use Dyspnea scale  Yes       Intervention  Provide education and explanation on how to use Dyspnea scale       Expected Outcomes  Short Term: Able to use Dyspnea scale  daily in rehab to express subjective sense of shortness of breath during exertion;Long Term: Able to use Dyspnea scale to guide intensity level when exercising independently       Knowledge and understanding of Target Heart Rate Range (THRR)  Yes       Intervention  Provide education and explanation of THRR including how the numbers were predicted and where they are located for reference       Expected Outcomes  Short Term: Able to state/look up THRR;Short Term: Able to use daily as guideline for intensity in rehab;Long Term: Able to use THRR to govern intensity when exercising independently       Understanding of Exercise Prescription  Yes       Intervention  Provide education, explanation, and written materials on patient's individual exercise prescription       Expected Outcomes  Short Term: Able to explain program exercise prescription;Long Term: Able to explain home exercise prescription to exercise independently          Exercise Goals Re-Evaluation : Exercise Goals Re-Evaluation    Row Name 11/17/17 1140 12/12/17 1515           Exercise Goal Re-Evaluation   Exercise Goals Review  -  Increase Physical Activity;Able to understand and use rate of  perceived exertion (RPE) scale;Knowledge and understanding of Target Heart Rate Range (THRR);Understanding of Exercise Prescription;Increase Strength and Stamina;Able to understand and use Dyspnea scale      Comments  Patient has not started rehab yet due to illness.   Patient has only attended 4 rehab sessions. Patient has been out due to illness. Will cont. to monitor and motivate once the patient returns.       Expected Outcomes  -   Through exercise at rehab and at home, patient will increase strength and stamina. Patient will also gain the confidence and knowledge to adhere to a exercise regime at home.         Discharge Exercise Prescription (Final Exercise Prescription Changes): Exercise Prescription Changes - 12/09/17 1500      Response to Exercise   Blood Pressure (Admit)  140/70    Blood Pressure (Exercise)  144/80    Blood Pressure (Exit)  130/80    Heart Rate (Admit)  68 bpm    Heart Rate (Exercise)  83 bpm    Heart Rate (Exit)  71 bpm    Oxygen Saturation (Admit)  98 %    Oxygen Saturation (Exercise)  96 %    Oxygen Saturation (Exit)  97 %    Rating of Perceived Exertion (Exercise)  13    Perceived Dyspnea (Exercise)  1    Duration  Continue with 45 min of aerobic exercise without signs/symptoms of physical distress.    Intensity  THRR unchanged      Progression   Progression  Continue to progress workloads to maintain intensity without signs/symptoms of physical distress.      Resistance Training   Training Prescription  Yes    Weight  orange bands    Reps  10-15    Time  10 Minutes      Bike   Level  5    Minutes  17      NuStep   Level  3    Minutes  17    METs  2      Track   Laps  24    Minutes  17       Nutrition:  Target Goals: Understanding  of nutrition guidelines, daily intake of sodium 1500mg , cholesterol 200mg , calories 30% from fat and 7% or less from saturated fats, daily to have 5 or more servings of fruits and  vegetables.  Biometrics: Pre Biometrics - 10/27/17 1437      Pre Biometrics   Grip Strength  40 kg        Nutrition Therapy Plan and Nutrition Goals: Nutrition Therapy & Goals - 10/31/17 1423      Nutrition Therapy   Diet  Low Sodium       Intervention Plan   Intervention  Prescribe, educate and counsel regarding individualized specific dietary modifications aiming towards targeted core components such as weight, hypertension, lipid management, diabetes, heart failure and other comorbidities.    Expected Outcomes  Short Term Goal: Understand basic principles of dietary content, such as calories, fat, sodium, cholesterol and nutrients.;Long Term Goal: Adherence to prescribed nutrition plan.       Nutrition Assessments: Nutrition Assessments - 10/31/17 1422      Rate Your Plate Scores   Pre Score  33       Nutrition Goals Re-Evaluation:   Nutrition Goals Discharge (Final Nutrition Goals Re-Evaluation):   Psychosocial: Target Goals: Acknowledge presence or absence of significant depression and/or stress, maximize coping skills, provide positive support system. Participant is able to verbalize types and ability to use techniques and skills needed for reducing stress and depression.  Initial Review & Psychosocial Screening: Initial Psych Review & Screening - 10/27/17 1511      Initial Review   Current issues with  Current Depression managed with medications      Family Dynamics   Good Support System?  Yes      Barriers   Psychosocial barriers to participate in program  There are no identifiable barriers or psychosocial needs.      Screening Interventions   Interventions  Encouraged to exercise       Quality of Life Scores:  Scores of 19 and below usually indicate a poorer quality of life in these areas.  A difference of  2-3 points is a clinically meaningful difference.  A difference of 2-3 points in the total score of the Quality of Life Index has been  associated with significant improvement in overall quality of life, self-image, physical symptoms, and general health in studies assessing change in quality of life.   PHQ-9: Recent Review Flowsheet Data    Depression screen [facility] 2/9 10/27/2017 11/25/2016 09/19/2016 05/15/2016 04/23/2016   Decreased Interest 1 1 1 1 1    Down, Depressed, Hopeless 2 1 1 1 1    PHQ - 2 Score 3 2 2 2 2    Altered sleeping 0 2 0 3 1   Tired, decreased energy 2 1 1 2 1    Change in appetite 0 2 0 2 0   Feeling bad or failure about yourself  - 1 0 1 1   Trouble concentrating 0 0 0 2 1   Moving slowly or fidgety/restless 0 1 1 2  0   Suicidal thoughts 0 0 0 0 0   PHQ-9 Score 5 9 4 14 6    Difficult doing work/chores Not difficult at all Somewhat difficult Somewhat difficult Very difficult Somewhat difficult     Interpretation of Total Score  Total Score Depression Severity:  1-4 = Minimal depression, 5-9 = Mild depression, 10-14 = Moderate depression, 15-19 = Moderately severe depression, 20-27 = Severe depression   Psychosocial Evaluation and Intervention: Psychosocial Evaluation - 10/27/17 1513  Psychosocial Evaluation & Interventions   Interventions  Encouraged to exercise with the program and follow exercise prescription    Expected Outcomes  patient will remain free from psychosocial barriers to participation in pulmonary rehab    Continue Psychosocial Services   Follow up required by staff       Psychosocial Re-Evaluation: Psychosocial Re-Evaluation    [place] Name 12/15/17 1650             Psychosocial Re-Evaluation   Current issues with  Current Depression;History of Depression;Current Psychotropic Meds       Comments  patient states "everything is fine" when questioned about depression. does not interact with classmates. will continue to attempt to address depression symptoms with patient.       Expected Outcomes  patient will remain free from psychosocial barriers to participation in pulmonary  rehab       Interventions  - patient currently in treatment       Continue Psychosocial Services   Follow up required by staff          Psychosocial Discharge (Final Psychosocial Re-Evaluation): Psychosocial Re-Evaluation - 12/15/17 1650      Psychosocial Re-Evaluation   Current issues with  Current Depression;History of Depression;Current Psychotropic Meds    Comments  patient states "everything is fine" when questioned about depression. does not interact with classmates. will continue to attempt to address depression symptoms with patient.    Expected Outcomes  patient will remain free from psychosocial barriers to participation in pulmonary rehab    Interventions  -- patient currently in treatment    Continue Psychosocial Services   Follow up required by staff       Education: Education Goals: Education classes will be provided on a weekly basis, covering required topics. Participant will state understanding/return demonstration of topics presented.  Learning Barriers/Preferences: Learning Barriers/Preferences - 10/27/17 1510      Learning Barriers/Preferences   Learning Barriers  None    Learning Preferences  Individual Instruction;Skilled Demonstration;Pictoral;Group Instruction;Verbal Instruction       Education Topics: Risk Factor Reduction:  -Group instruction that is supported by a PowerPoint presentation. Instructor discusses the definition of a risk factor, different risk factors for pulmonary disease, and how the heart and lungs work together.     Nutrition for Pulmonary Patient:  -Group instruction provided by PowerPoint slides, verbal discussion, and written materials to support subject matter. The instructor gives an explanation and review of healthy diet recommendations, which includes a discussion on weight management, recommendations for fruit and vegetable consumption, as well as protein, fluid, caffeine, fiber, sodium, sugar, and alcohol. Tips for eating when  patients are short of breath are discussed.   Pursed Lip Breathing:  -Group instruction that is supported by demonstration and informational handouts. Instructor discusses the benefits of pursed lip and diaphragmatic breathing and detailed demonstration on how to preform both.     Oxygen Safety:  -Group instruction provided by PowerPoint, verbal discussion, and written material to support subject matter. There is an overview of "What is Oxygen" and "Why do we need it".  Instructor also reviews how to create a safe environment for oxygen use, the importance of using oxygen as prescribed, and the risks of noncompliance. There is a brief discussion on traveling with oxygen and resources the patient may utilize.   Oxygen Equipment:  -Group instruction provided by [facility] Staff utilizing handouts, written materials, and equipment demonstrations.   Signs and Symptoms:  -Group instruction provided by written material and verbal  discussion to support subject matter. Warning signs and symptoms of infection, stroke, and heart attack are reviewed and when to call the physician/911 reinforced. Tips for preventing the spread of infection discussed.   Advanced Directives:  -Group instruction provided by verbal instruction and written material to support subject matter. Instructor reviews Advanced Directive laws and proper instruction for filling out document.   Pulmonary Video:  -Group video education that reviews the importance of medication and oxygen compliance, exercise, good nutrition, pulmonary hygiene, and pursed lip and diaphragmatic breathing for the pulmonary patient.   Exercise for the Pulmonary Patient:  -Group instruction that is supported by a PowerPoint presentation. Instructor discusses benefits of exercise, core components of exercise, frequency, duration, and intensity of an exercise routine, importance of utilizing pulse oximetry during exercise, safety while exercising, and  options of places to exercise outside of rehab.     Pulmonary Medications:  -Verbally interactive group education provided by instructor with focus on inhaled medications and proper administration.   PULMONARY REHAB OTHER RESPIRATORY from 12/04/2017 in [place]  Date  12/04/17  Educator  pharmacist  Instruction Review Code  1- Verbalizes Understanding      Anatomy and Physiology of the Respiratory System and Intimacy:  -Group instruction provided by PowerPoint, verbal discussion, and written material to support subject matter. Instructor reviews respiratory cycle and anatomical components of the respiratory system and their functions. Instructor also reviews differences in obstructive and restrictive respiratory diseases with examples of each. Intimacy, Sex, and Sexuality differences are reviewed with a discussion on how relationships can change when diagnosed with pulmonary disease. Common sexual concerns are reviewed.   MD DAY -A group question and answer session with a medical doctor that allows participants to ask questions that relate to their pulmonary disease state.   OTHER EDUCATION -Group or individual verbal, written, or video instructions that support the educational goals of the pulmonary rehab program.   Holiday Eating Survival Tips:  -Group instruction provided by PowerPoint slides, verbal discussion, and written materials to support subject matter. The instructor gives patients tips, tricks, and techniques to help them not only survive but enjoy the holidays despite the onslaught of food that accompanies the holidays.   Knowledge Questionnaire Score: Knowledge Questionnaire Score - 10/30/17 1204      Knowledge Questionnaire Score   Pre Score  17/18       Core Components/Risk Factors/Patient Goals at Admission: Personal Goals and Risk Factors at Admission - 10/27/17 1511      Core Components/Risk Factors/Patient Goals on Admission    Improve shortness of breath with ADL's  Yes    Intervention  Provide education, individualized exercise plan and daily activity instruction to help decrease symptoms of SOB with activities of daily living.    Expected Outcomes  Short Term: Improve cardiorespiratory fitness to achieve a reduction of symptoms when performing ADLs;Long Term: Be able to perform more ADLs without symptoms or delay the onset of symptoms       Core Components/Risk Factors/Patient Goals Review:  Goals and Risk Factor Review    Row Name 12/15/17 1645             Core Components/Risk Factors/Patient Goals Review   Personal Goals Review  Improve shortness of breath with ADL's;Develop more efficient breathing techniques such as purse lipped breathing and diaphragmatic breathing and practicing self-pacing with activity.;Increase knowledge of respiratory medications and ability to use respiratory devices properly.       Review  patient  has only attended 4 pulmonary rehab sessions since admission. he tolerates workload increases however he is not consistant with his attendance. he has 2 vacations planned for this month. he has to be encouraged to purse lip breath. will discuss with patient long personal long term goals again to see what is best course of action given patient is planning multiple extended absenses.       Expected Outcomes  see admission expected outcomes.          Core Components/Risk Factors/Patient Goals at Discharge (Final Review):  Goals and Risk Factor Review - 12/15/17 1645      Core Components/Risk Factors/Patient Goals Review   Personal Goals Review  Improve shortness of breath with ADL's;Develop more efficient breathing techniques such as purse lipped breathing and diaphragmatic breathing and practicing self-pacing with activity.;Increase knowledge of respiratory medications and ability to use respiratory devices properly.    Review  patient has only attended 4 pulmonary rehab sessions since  admission. he tolerates workload increases however he is not consistant with his attendance. he has 2 vacations planned for this month. he has to be encouraged to purse lip breath. will discuss with patient long personal long term goals again to see what is best course of action given patient is planning multiple extended absenses.    Expected Outcomes  see admission expected outcomes.       ITP Comments:   Comments: patient has attended 4 sessions since admission

## 2017-12-16 ENCOUNTER — Encounter (HOSPITAL_COMMUNITY)
Admission: RE | Admit: 2017-12-16 | Discharge: 2017-12-16 | Disposition: A | Discharge status: Home / Self Care | Payer: 59 | Source: Ambulatory Visit | Attending: Internal Medicine | Admitting: Internal Medicine

## 2017-12-16 DIAGNOSIS — J438 Other emphysema: Secondary | ICD-10-CM

## 2017-12-16 NOTE — Progress Notes (Signed)
Daily Session Note  Patient Details  Name: Travis Salinas MRN: [id number] Date of Birth: 1950/12/23 Referring Provider:     Pulmonary Rehab Walk Test from 11/04/2017 in [place]  Referring Provider  Dr. Chase Caller      Encounter Date: 12/16/2017  Check In: Session Check In - 12/16/17 1519      Check-In   Location  [facility]    Staff Present  Trish Fountain, RN, BSN;Molly diVincenzo, MS, ACSM RCEP, Exercise Physiologist;Aylyn Wenzler Ysidro Evert, RN    Supervising physician immediately available to respond to emergencies  [facility] immediately available    Physician(s)  Dr. Lonny Prude    Medication changes reported      No    Fall or balance concerns reported     No    Tobacco Cessation  No Change    Warm-up and Cool-down  Performed as group-led instruction    Resistance Training Performed  Yes    VAD Patient?  No      Pain Assessment   Currently in Pain?  No/denies    Multiple Pain Sites  No       Capillary Blood Glucose: No results found for this or any previous visit (from the past 24 hour(s)).    Social History   Tobacco Use  Smoking Status Former Smoker  . Packs/day: 0.50  . Years: 25.00  . Pack years: 12.50  . Types: Cigarettes  . Last attempt to quit: 2002  . Years since quitting: 17.2  Smokeless Tobacco Never Used    Goals Met:  Exercise tolerated well No report of cardiac concerns or symptoms Strength training completed today  Goals Unmet:  Not Applicable  Comments: Service time is from 1330 to 1500    Dr. Rush Farmer is Medical Director for Pulmonary Rehab at [facility].

## 2017-12-18 ENCOUNTER — Encounter (HOSPITAL_COMMUNITY)
Admission: RE | Admit: 2017-12-18 | Discharge: 2017-12-18 | Disposition: A | Discharge status: Home / Self Care | Payer: 59 | Source: Ambulatory Visit | Attending: Internal Medicine | Admitting: Internal Medicine

## 2017-12-18 VITALS — Wt 147.0 lb

## 2017-12-18 DIAGNOSIS — J438 Other emphysema: Secondary | ICD-10-CM

## 2017-12-18 NOTE — Progress Notes (Signed)
Daily Session Note  Patient Details  Name: Travis Salinas MRN: [id number] Date of Birth: 08/23/1951 Referring Provider:     Pulmonary Rehab Walk Test from 11/04/2017 in [place]  Referring Provider  Dr. Chase Caller      Encounter Date: 12/18/2017  Check In: Session Check In - 12/18/17 1410      Check-In   Location  [facility]    Staff Present  Trish Fountain, RN, BSN;Molly diVincenzo, MS, ACSM RCEP, Exercise Physiologist;Carlette Wilber Oliphant, RN, BSN    Supervising physician immediately available to respond to emergencies  [facility] immediately available    Physician(s)  Dr. Alfredia Ferguson    Medication changes reported      No    Fall or balance concerns reported     No    Tobacco Cessation  No Change    Warm-up and Cool-down  Performed as group-led instruction    Resistance Training Performed  Yes    VAD Patient?  No      Pain Assessment   Currently in Pain?  No/denies    Multiple Pain Sites  No       Capillary Blood Glucose: No results found for this or any previous visit (from the past 24 hour(s)).    Social History   Tobacco Use  Smoking Status Former Smoker  . Packs/day: 0.50  . Years: 25.00  . Pack years: 12.50  . Types: Cigarettes  . Last attempt to quit: 2002  . Years since quitting: 17.2  Smokeless Tobacco Never Used    Goals Met:  Exercise tolerated well Queuing for purse lip breathing No report of cardiac concerns or symptoms Strength training completed today  Goals Unmet:  Not Applicable  Comments: Service time is from 1330 to 1530   Dr. Rush Farmer is Medical Director for Pulmonary Rehab at [facility].

## 2017-12-23 ENCOUNTER — Encounter (HOSPITAL_COMMUNITY): Payer: 59

## 2017-12-23 ENCOUNTER — Telehealth (HOSPITAL_COMMUNITY): Payer: Self-pay | Admitting: Family Medicine

## 2017-12-23 NOTE — Progress Notes (Signed)
Daily Session Note  Patient Details  Name: Cabell Lazenby MRN: [id number] Date of Birth: 1951-04-30 Referring Provider:     Pulmonary Rehab Walk Test from 11/04/2017 in [place]  Referring Provider  Dr. Chase Caller      Encounter Date: 12/18/2017  Check In:   Capillary Blood Glucose: No results found for this or any previous visit (from the past 24 hour(s)).  Exercise Prescription Changes - 12/23/17 1600      Response to Exercise   Blood Pressure (Admit)  144/80    Blood Pressure (Exercise)  144/70    Blood Pressure (Exit)  120/64    Heart Rate (Admit)  91 bpm    Heart Rate (Exercise)  93 bpm    Heart Rate (Exit)  69 bpm    Oxygen Saturation (Admit)  95 %    Oxygen Saturation (Exercise)  93 %    Oxygen Saturation (Exit)  96 %    Rating of Perceived Exertion (Exercise)  13    Perceived Dyspnea (Exercise)  0    Duration  Continue with 45 min of aerobic exercise without signs/symptoms of physical distress.    Intensity  THRR unchanged      Progression   Progression  Continue to progress workloads to maintain intensity without signs/symptoms of physical distress.      Resistance Training   Training Prescription  Yes    Weight  orange bands    Reps  10-15    Time  10 Minutes      Bike   Level  6    Minutes  17      NuStep   Level  6    Minutes  17    METs  3.1       Social History   Tobacco Use  Smoking Status Former Smoker  . Packs/day: 0.50  . Years: 25.00  . Pack years: 12.50  . Types: Cigarettes  . Last attempt to quit: 2002  . Years since quitting: 17.2  Smokeless Tobacco Never Used    Goals Met:  Exercise tolerated well No report of cardiac concerns or symptoms Strength training completed today  Goals Unmet:  Not Applicable  Comments: Service time is from 1:30P to 3:30P    Dr. Rush Farmer is Medical Director for Pulmonary Rehab at [facility].

## 2017-12-25 ENCOUNTER — Encounter (HOSPITAL_COMMUNITY)
Admission: RE | Admit: 2017-12-25 | Discharge: 2017-12-25 | Disposition: A | Discharge status: Home / Self Care | Payer: 59 | Source: Ambulatory Visit | Attending: Internal Medicine | Admitting: Internal Medicine

## 2017-12-25 DIAGNOSIS — J438 Other emphysema: Secondary | ICD-10-CM | POA: Diagnosis not present

## 2017-12-25 NOTE — Progress Notes (Signed)
Daily Session Note  Patient Details  Name: Travis Salinas MRN: [id number] Date of Birth: 01-Oct-1950 Referring Provider:     Pulmonary Rehab Walk Test from 11/04/2017 in [place]  Referring Provider  Dr. Chase Caller      Encounter Date: 12/25/2017  Check In: Session Check In - 12/25/17 1400      Check-In   Location  [facility]    Staff Present  Trish Fountain, RN, BSN;Zafir Schauer, MS, ACSM RCEP, Exercise Physiologist;Lisa Ysidro Evert, RN    Supervising physician immediately available to respond to emergencies  [facility] immediately available    Physician(s)  Dr. Cruzita Lederer    Medication changes reported      No    Fall or balance concerns reported     No    Tobacco Cessation  No Change    Warm-up and Cool-down  Performed as group-led instruction    Resistance Training Performed  Yes    VAD Patient?  No      Pain Assessment   Currently in Pain?  No/denies    Multiple Pain Sites  No       Capillary Blood Glucose: No results found for this or any previous visit (from the past 24 hour(s)).    Social History   Tobacco Use  Smoking Status Former Smoker  . Packs/day: 0.50  . Years: 25.00  . Pack years: 12.50  . Types: Cigarettes  . Last attempt to quit: 2002  . Years since quitting: 17.3  Smokeless Tobacco Never Used    Goals Met:  Exercise tolerated well No report of cardiac concerns or symptoms Strength training completed today  Goals Unmet:  Not Applicable  Comments: Service time is from 1:30p to 3:40p    Dr. Rush Farmer is Medical Director for Pulmonary Re[facility] at [facility].

## 2017-12-30 ENCOUNTER — Encounter (HOSPITAL_COMMUNITY): Payer: 59

## 2017-12-30 ENCOUNTER — Telehealth (HOSPITAL_COMMUNITY): Payer: Self-pay

## 2017-12-30 NOTE — Telephone Encounter (Signed)
Patient called and stated he will not be attending class today as he has a very important Dr appt to attend.

## 2017-12-31 DIAGNOSIS — F341 Dysthymic disorder: Secondary | ICD-10-CM | POA: Diagnosis not present

## 2017-12-31 DIAGNOSIS — F411 Generalized anxiety disorder: Secondary | ICD-10-CM | POA: Diagnosis not present

## 2017-12-31 DIAGNOSIS — F3341 Major depressive disorder, recurrent, in partial remission: Secondary | ICD-10-CM | POA: Diagnosis not present

## 2018-01-01 ENCOUNTER — Encounter (HOSPITAL_COMMUNITY)
Admission: RE | Admit: 2018-01-01 | Discharge: 2018-01-01 | Disposition: A | Discharge status: Home / Self Care | Payer: 59 | Source: Ambulatory Visit | Attending: Internal Medicine | Admitting: Internal Medicine

## 2018-01-01 DIAGNOSIS — J438 Other emphysema: Secondary | ICD-10-CM | POA: Diagnosis not present

## 2018-01-01 NOTE — Progress Notes (Addendum)
Daily Session Note  Patient Details  Name: Travis Salinas MRN: [id number] Date of Birth: May 12, 1951 Referring Provider:     Pulmonary Rehab Walk Test from 11/04/2017 in [place]  Referring Provider  Dr. Chase Caller      Encounter Date: 01/01/2018  Check In: Session Check In - 01/01/18 1246      Check-In   Location  [facility]    Staff Present  Cloyde Reams DiVincenzo, MS, ACSM RCEP, Exercise Physiologist;Paizley Ramella Ysidro Evert, RN    Supervising physician immediately available to respond to emergencies  [facility] immediately available    Physician(s)  Dr. Maylene Roes    Medication changes reported      No    Fall or balance concerns reported     No    Tobacco Cessation  No Change    Warm-up and Cool-down  Performed as group-led instruction    Resistance Training Performed  Yes    VAD Patient?  No      Pain Assessment   Currently in Pain?  No/denies    Multiple Pain Sites  No       Capillary Blood Glucose: No results found for this or any previous visit (from the past 24 hour(s)).    Social History   Tobacco Use  Smoking Status Former Smoker  . Packs/day: 0.50  . Years: 25.00  . Pack years: 12.50  . Types: Cigarettes  . Last attempt to quit: 2002  . Years since quitting: 17.3  Smokeless Tobacco Never Used    Goals Met:  Exercise tolerated well No report of cardiac concerns or symptoms Strength training completed today  Goals Unmet:  Not Applicable  Comments: Service time is from 1330 to 1435    Dr. Rush Farmer is Medical Director for Pulmonary Rehab at [facility].

## 2018-01-02 DIAGNOSIS — M542 Cervicalgia: Secondary | ICD-10-CM | POA: Diagnosis not present

## 2018-01-02 DIAGNOSIS — M1711 Unilateral primary osteoarthritis, right knee: Secondary | ICD-10-CM | POA: Diagnosis not present

## 2018-01-02 DIAGNOSIS — M545 Low back pain: Secondary | ICD-10-CM | POA: Diagnosis not present

## 2018-01-02 NOTE — Progress Notes (Signed)
I have reviewed a Home Exercise Prescription with Morton Peters . Deanta is not currently exercising at home.  The patient was advised to walk 3-5 days a week for 30-45 minutes.  Ras and I discussed how to progress their exercise prescription.  The patient stated that their goals were to build his body back up and decrease shortness of breath with inclines.  The patient stated that they understand the exercise prescription.  We reviewed exercise guidelines, target heart rate during exercise, oxygen use, weather, home pulse oximeter, endpoints for exercise, and goals.  Patient is encouraged to come to me with any questions. I will continue to follow up with the patient to assist them with progression and safety.

## 2018-01-05 ENCOUNTER — Telehealth: Payer: Self-pay | Admitting: Internal Medicine

## 2018-01-05 MED ORDER — DOXYCYCLINE HYCLATE 100 MG PO TABS: 100.0000 mg | ORAL_TABLET | Freq: Two times a day (BID) | ORAL | 0 refills | Status: DC

## 2018-01-05 MED ORDER — PREDNISONE 10 MG PO TABS: ORAL_TABLET | ORAL | 0 refills | Status: DC

## 2018-01-05 NOTE — Telephone Encounter (Signed)
Spoke with pt. States that he is not feeling well. Reports deep cough, chest congestion. Cough is non productive. Denies chest tightness, wheezing, SOB or fever. Symptoms started 2 days ago. Pt is refusing an appointment. Pt is demanding an antibiotic and cough syrup.  Dr. Chase Caller - please advise. Thanks.

## 2018-01-05 NOTE — Telephone Encounter (Signed)
Please take prednisone 40 mg x1 day, then 30 mg x1 day, then 20 mg x1 day, then 10 mg x1 day, and then 5 mg x1 day and stop  And  Take doxycycline 100mg  po twice daily x 5 days; take after meals and avoid sunlight     Allergies  Allergen Reactions  . Propoxyphene Nausea And Vomiting  . Amoxicillin Nausea And Vomiting  . Ciprofloxacin Nausea Only  . Depakote [Divalproex Sodium] Other (See Comments)    hallucinations

## 2018-01-05 NOTE — Telephone Encounter (Signed)
Called and spoke with patient. Advised of recommendations. Nothing further needed.

## 2018-01-06 ENCOUNTER — Encounter (HOSPITAL_COMMUNITY): Payer: 59

## 2018-01-08 ENCOUNTER — Encounter (HOSPITAL_COMMUNITY): Payer: 59

## 2018-01-13 ENCOUNTER — Encounter (HOSPITAL_COMMUNITY): Payer: 59

## 2018-01-14 DIAGNOSIS — J189 Pneumonia, unspecified organism: Secondary | ICD-10-CM | POA: Diagnosis not present

## 2018-01-14 DIAGNOSIS — R2681 Unsteadiness on feet: Secondary | ICD-10-CM | POA: Diagnosis not present

## 2018-01-14 DIAGNOSIS — L0390 Cellulitis, unspecified: Secondary | ICD-10-CM | POA: Diagnosis not present

## 2018-01-14 DIAGNOSIS — I5030 Unspecified diastolic (congestive) heart failure: Secondary | ICD-10-CM | POA: Diagnosis not present

## 2018-01-14 DIAGNOSIS — L0291 Cutaneous abscess, unspecified: Secondary | ICD-10-CM | POA: Diagnosis not present

## 2018-01-14 DIAGNOSIS — J449 Chronic obstructive pulmonary disease, unspecified: Secondary | ICD-10-CM | POA: Diagnosis not present

## 2018-01-15 ENCOUNTER — Encounter (HOSPITAL_COMMUNITY): Payer: 59

## 2018-01-20 ENCOUNTER — Encounter (HOSPITAL_COMMUNITY): Payer: 59

## 2018-01-22 ENCOUNTER — Encounter (HOSPITAL_COMMUNITY): Payer: 59

## 2018-01-26 DIAGNOSIS — R3912 Poor urinary stream: Secondary | ICD-10-CM | POA: Diagnosis not present

## 2018-01-26 DIAGNOSIS — N401 Enlarged prostate with lower urinary tract symptoms: Secondary | ICD-10-CM | POA: Diagnosis not present

## 2018-01-27 ENCOUNTER — Encounter (HOSPITAL_COMMUNITY): Payer: 59

## 2018-01-29 ENCOUNTER — Encounter (HOSPITAL_COMMUNITY): Payer: 59

## 2018-01-30 DIAGNOSIS — M1711 Unilateral primary osteoarthritis, right knee: Secondary | ICD-10-CM | POA: Diagnosis not present

## 2018-02-03 ENCOUNTER — Encounter (HOSPITAL_COMMUNITY): Payer: 59

## 2018-02-05 ENCOUNTER — Encounter (HOSPITAL_COMMUNITY): Payer: 59

## 2018-02-10 ENCOUNTER — Encounter (HOSPITAL_COMMUNITY): Payer: 59

## 2018-02-12 ENCOUNTER — Encounter (HOSPITAL_COMMUNITY): Payer: 59

## 2018-02-14 DIAGNOSIS — L0291 Cutaneous abscess, unspecified: Secondary | ICD-10-CM | POA: Diagnosis not present

## 2018-02-14 DIAGNOSIS — L0390 Cellulitis, unspecified: Secondary | ICD-10-CM | POA: Diagnosis not present

## 2018-02-14 DIAGNOSIS — J449 Chronic obstructive pulmonary disease, unspecified: Secondary | ICD-10-CM | POA: Diagnosis not present

## 2018-02-14 DIAGNOSIS — I5030 Unspecified diastolic (congestive) heart failure: Secondary | ICD-10-CM | POA: Diagnosis not present

## 2018-02-14 DIAGNOSIS — J189 Pneumonia, unspecified organism: Secondary | ICD-10-CM | POA: Diagnosis not present

## 2018-02-14 DIAGNOSIS — R2681 Unsteadiness on feet: Secondary | ICD-10-CM | POA: Diagnosis not present

## 2018-02-16 NOTE — Addendum Note (Signed)
Encounter addended by: Gaspar Bidding on: 02/16/2018 4:16 PM  Actions taken: Episode resolved, Sign clinical note

## 2018-02-16 NOTE — Progress Notes (Signed)
Discharge Progress Report  Patient Details  Name: Travis Salinas MRN: [id number] Date of Birth: 03/11/1951 Referring Provider:     Pulmonary Rehab Walk Test from 11/04/2017 in [place]  Referring Provider  Dr. Chase Caller       Number of Visits: 8  Reason for Discharge:  Early Exit:  Personal  Smoking History:  Social History   Tobacco Use  Smoking Status Former Smoker  . Packs/day: 0.50  . Years: 25.00  . Pack years: 12.50  . Types: Cigarettes  . Last attempt to quit: 2002  . Years since quitting: 17.4  Smokeless Tobacco Never Used    Diagnosis:  Other emphysema ([place])  ADL UCSD: Pulmonary Assessment Scores    Row Name 11/06/17 1310         ADL UCSD   ADL Phase  Entry       mMRC Score   mMRC Score  0        Initial Exercise Prescription: Initial Exercise Prescription - 11/06/17 1300      Date of Initial Exercise RX and Referring Provider   Date  11/06/17    Referring Provider  Dr. Chase Caller      Bike   Level  0.6    Minutes  17      NuStep   Level  2    SPM  80    Minutes  17    METs  1.5      Track   Laps  10    Minutes  17      Prescription Details   Frequency (times per week)  2    Duration  Progress to 45 minutes of aerobic exercise without signs/symptoms of physical distress      Intensity   THRR 40-80% of Max Heartrate  62-124    Ratings of Perceived Exertion  11-13    Perceived Dyspnea  0-4      Progression   Progression  Continue progressive overload as per policy without signs/symptoms or physical distress.      Resistance Training   Training Prescription  Yes    Weight  orange bands    Reps  10-15       Discharge Exercise Prescription (Final Exercise Prescription Changes): Exercise Prescription Changes - 01/02/18 0800      Home Exercise Plan   Plans to continue exercise at  Home (comment)    Frequency  Add 2 additional days to program exercise sessions.       Functional  Capacity: 6 Minute Walk    Row Name 11/06/17 1311         6 Minute Walk   Phase  Initial     Distance  1600 feet     Walk Time  6 minutes     # of Rest Breaks  0     MPH  3.03     METS  3.3     RPE  11     Perceived Dyspnea   0     Symptoms  No     Resting HR  70 bpm     Resting BP  136/80     Resting Oxygen Saturation   94 %     Exercise Oxygen Saturation  during 6 min walk  92 %     Max Ex. HR  97 bpm     Max Ex. BP  154/68       Interval HR   1  Minute HR  77     2 Minute HR  88     3 Minute HR  97     4 Minute HR  93     5 Minute HR  94     6 Minute HR  93     2 Minute Post HR  79     Interval Heart Rate?  Yes       Interval Oxygen   Interval Oxygen?  Yes     Baseline Oxygen Saturation %  94 %     1 Minute Oxygen Saturation %  92 %     1 Minute Liters of Oxygen  0 L     2 Minute Oxygen Saturation %  93 %     2 Minute Liters of Oxygen  0 L     3 Minute Oxygen Saturation %  92 %     3 Minute Liters of Oxygen  0 L     4 Minute Oxygen Saturation %  94 %     4 Minute Liters of Oxygen  0 L     5 Minute Oxygen Saturation %  94 %     5 Minute Liters of Oxygen  0 L     6 Minute Oxygen Saturation %  95 %     6 Minute Liters of Oxygen  0 L     2 Minute Post Oxygen Saturation %  96 %     2 Minute Post Liters of Oxygen  0 L        Psychological, QOL, Others - Outcomes: PHQ 2/9: Depression screen [facility] 2/9 10/27/2017 11/25/2016 09/19/2016 05/15/2016 04/23/2016  Decreased Interest 1 1 1 1 1   Down, Depressed, Hopeless 2 1 1 1 1   PHQ - 2 Score 3 2 2 2 2   Altered sleeping 0 2 0 3 1  Tired, decreased energy 2 1 1 2 1   Change in appetite 0 2 0 2 0  Feeling bad or failure about yourself  - 1 0 1 1  Trouble concentrating 0 0 0 2 1  Moving slowly or fidgety/restless 0 1 1 2  0  Suicidal thoughts 0 0 0 0 0  PHQ-9 Score 5 9 4 14 6   Difficult doing work/chores Not difficult at all Somewhat difficult Somewhat difficult Very difficult Somewhat difficult    Quality of  Life:   Personal Goals: Goals established at orientation with interventions provided to work toward goal. Personal Goals and Risk Factors at Admission - 10/27/17 1511      Core Components/Risk Factors/Patient Goals on Admission   Improve shortness of breath with ADL's  Yes    Intervention  Provide education, individualized exercise plan and daily activity instruction to help decrease symptoms of SOB with activities of daily living.    Expected Outcomes  Short Term: Improve cardiorespiratory fitness to achieve a reduction of symptoms when performing ADLs;Long Term: Be able to perform more ADLs without symptoms or delay the onset of symptoms        Personal Goals Discharge: Goals and Risk Factor Review    Row Name 12/15/17 1645             Core Components/Risk Factors/Patient Goals Review   Personal Goals Review  Improve shortness of breath with ADL's;Develop more efficient breathing techniques such as purse lipped breathing and diaphragmatic breathing and practicing self-pacing with activity.;Increase knowledge of respiratory medications and ability to use respiratory devices properly.  Review  patient has only attended 4 pulmonary rehab sessions since admission. he tolerates workload increases however he is not consistant with his attendance. he has 2 vacations planned for this month. he has to be encouraged to purse lip breath. will discuss with patient long personal long term goals again to see what is best course of action given patient is planning multiple extended absenses.       Expected Outcomes  see admission expected outcomes.          Exercise Goals and Review: Exercise Goals    Row Name 10/27/17 1434             Exercise Goals   Increase Physical Activity  Yes       Intervention  Provide advice, education, support and counseling about physical activity/exercise needs.;Develop an individualized exercise prescription for aerobic and resistive training based on  initial evaluation findings, risk stratification, comorbidities and participant's personal goals.       Expected Outcomes  Short Term: Attend rehab on a regular basis to increase amount of physical activity.;Long Term: Exercising regularly at least 3-5 days a week.;Long Term: Add in home exercise to make exercise part of routine and to increase amount of physical activity.       Increase Strength and Stamina  Yes       Intervention  Provide advice, education, support and counseling about physical activity/exercise needs.;Develop an individualized exercise prescription for aerobic and resistive training based on initial evaluation findings, risk stratification, comorbidities and participant's personal goals.       Expected Outcomes  Short Term: Increase workloads from initial exercise prescription for resistance, speed, and METs.;Short Term: Perform resistance training exercises routinely during rehab and add in resistance training at home;Long Term: Improve cardiorespiratory fitness, muscular endurance and strength as measured by increased METs and functional capacity (6MWT)       Able to understand and use rate of perceived exertion (RPE) scale  Yes       Intervention  Provide education and explanation on how to use RPE scale       Expected Outcomes  Short Term: Able to use RPE daily in rehab to express subjective intensity level;Long Term:  Able to use RPE to guide intensity level when exercising independently       Able to understand and use Dyspnea scale  Yes       Intervention  Provide education and explanation on how to use Dyspnea scale       Expected Outcomes  Short Term: Able to use Dyspnea scale daily in rehab to express subjective sense of shortness of breath during exertion;Long Term: Able to use Dyspnea scale to guide intensity level when exercising independently       Knowledge and understanding of Target Heart Rate Range (THRR)  Yes       Intervention  Provide education and explanation of  THRR including how the numbers were predicted and where they are located for reference       Expected Outcomes  Short Term: Able to state/look up THRR;Short Term: Able to use daily as guideline for intensity in rehab;Long Term: Able to use THRR to govern intensity when exercising independently       Understanding of Exercise Prescription  Yes       Intervention  Provide education, explanation, and written materials on patient's individual exercise prescription       Expected Outcomes  Short Term: Able to explain program exercise prescription;Long Term: Able to explain  home exercise prescription to exercise independently          Nutrition & Weight - Outcomes: Pre Biometrics - 10/27/17 1437      Pre Biometrics   Grip Strength  40 kg        Nutrition: Nutrition Therapy & Goals - 10/31/17 1423      Nutrition Therapy   Diet  Low Sodium       Intervention Plan   Intervention  Prescribe, educate and counsel regarding individualized specific dietary modifications aiming towards targeted core components such as weight, hypertension, lipid management, diabetes, heart failure and other comorbidities.    Expected Outcomes  Short Term Goal: Understand basic principles of dietary content, such as calories, fat, sodium, cholesterol and nutrients.;Long Term Goal: Adherence to prescribed nutrition plan.       Nutrition Discharge: Nutrition Assessments - 10/31/17 1422      Rate Your Plate Scores   Pre Score  33       Education Questionnaire Score:   Goals reviewed with patient; copy given to patient.

## 2018-02-17 ENCOUNTER — Encounter (HOSPITAL_COMMUNITY): Payer: 59

## 2018-02-17 ENCOUNTER — Encounter: Payer: Self-pay | Admitting: Internal Medicine

## 2018-02-17 ENCOUNTER — Ambulatory Visit (INDEPENDENT_AMBULATORY_CARE_PROVIDER_SITE_OTHER): Payer: 59 | Admitting: Internal Medicine

## 2018-02-17 VITALS — BP 118/68 | HR 56 | Ht 70.0 in | Wt 143.2 lb

## 2018-02-17 DIAGNOSIS — J439 Emphysema, unspecified: Secondary | ICD-10-CM

## 2018-02-17 NOTE — Progress Notes (Signed)
Subjective:     Patient ID: Travis Salinas, male   DOB: [birth year], 67 y.o.   MRN: [id number]  HPI     PCP Kathyrn Lass, MD   HPI   IOV  08/22/2017   Chief Complaint  Patient presents with  . Advice Only    Referred by Dr. Lennette Bihari Via for COPD.  Pt does have complaints of SOB. Pt had pna and was hospitalized 10/5-10/26    Travis Salinas. Travis Salinas is a 67 year old male who is been referred by primary care physician for COPD.  History is obtained from him, talking to his wife and review of the outside chart from primary care physician and past medical chart in the electronic health record.  He carries a diagnosis of COPD for many years.  He is always maintained himself on albuterol as needed.  He quit smoking 16 years ago.  Then all of a sudden in September 2018 he was hospitalized for rhinovirus and associated pneumococcal bacteremia with pneumonia.  He spent over a month in the hospital including several weeks in the intensive care unit being treated with BiPAP but not intubated.  He spent time between the critical care service and also the hospitalist service.  He ended up getting discharged in October 2018 to a long-term acute care facility and from there to an inpatient rehab facility.  He has now been home for 6 weeks.  At this point in time his COPD CAT score is 12.  He feels improved and back to baseline but according to his wife he still far away from baseline.  Overall fatigue and shortness of breath with exertion or major issues.  This is documented in the COPD CAD score.  His current COPD medications only albuterol as needed.  He does not have pulmonary function test in the past.  Review of the chart also shows he had acute diastolic heart failure while in the hospital and he was diuresed.  A few days ago primary care physician is stopped his Lasix and his told the family that I could restart it at my discretion.  His last set of lab work was in November 2018 with primary care physician  but in October 2018 in our health record system he had normal creatinine but anemia of chronic disease/critical illness  His last chest x-ray that I personally visualized was June 17, 2017 that still showed pulmonary infiltrates.  According to the family he had another chest x-ray in November 2018 that still shows pulmonary infiltrates.  He has not had follow-up imaging since then.   OV 10/01/2017  Chief Complaint  Patient presents with  . Follow-up    PFT done today.  CT scan done 09/19/17.  Pt states he has been doing much better since last visit. States he has mild SOB with exertion. Pt's wife states he used his O2 last night and some this morning which pt stated it was to prepare for today's visit.   Follow-up emphysema and recent hospitalization for pneumococcal pneumonia  Present his wife.  In the interim he has been on Spiriva.  Smoking is in remission.  He continues to improve.  COPD CAT score is dropped to 6.  He feels great.  He is here to start pulmonary rehabilitation.  He had follow-up CT scan of the chest in January 2019 and when compared to his pneumonia in September 2018 his or pulmonary infiltrates of all clear except he has bilateral upper lobe scarring.  He does have evidence  of emphysema on a CT chest.  His pulmonary function test is normal except for reduction in DLCO at 40%.  He continues to use oxygen at night.  He is compliant with his Spiriva.  His blood work is all normal.  His wife wants FMLA paperwork filled because she has to take him to rehab.  They are wondering about the rehab referral.    OV 02/17/2018  Chief Complaint  Patient presents with  . Follow-up    67-month follow up visit. Pt states he has been doing well and denies any complaints.   Follow-up pulmonary emphysema with isolated reduction in diffusion capacity and history of smoking  Last seen January 2019.  After that he had COPD exacerbation and seen acutely and treated as an outpatient.  This was  in March 2019 when he saw my colleague Dr. Halford Chessman.  Chest x-ray at that time was clear.  Currently he is feeling well with Spiriva.  He denies any smoking.  Wife also states he does not smoke anymore.  COPD CAT score is 9 which is close to his baseline.  He stopped attending pulmonary rehabilitation because of travel and his exacerbation and he wants to rejoin.  Given his emphysema and age of 79 he might be a candidate for lung cancer screening program with a nurse intake smoking history seems inadequate for that.  We will have to reassess his smoking history in detail.   CAT COPD Symptom & Quality of Life Score ([employer] trademark) 0 is no burden. 5 is highest burden 08/22/2017  1 10/01/2017 spiriva and time 02/17/2018   Never Cough -> Cough all the time 0 0 0  No phlegm in chest -> Chest is full of phlegm 1 0 1  No chest tightness -> Chest feels very tight 4 0 2  No dyspnea for 1 flight stairs/hill -> Very dyspneic for 1 flight of stairs 2 2 0  No limitations for ADL at home -> Very limited with ADL at home 0 1 0  Confident leaving home -> Not at all confident leaving home 0 0 0  Sleep soundly -> Do not sleep soundly because of lung condition 4 0 3  Lots of Energy -> No energy at all  3 3  TOTAL Score (max 40)  12 6 9        has a past medical history of Arthritis, Cervical disc herniation, COPD (chronic obstructive pulmonary disease) ([place]), Headache, Hepatitis C, and Hypertension.   reports that he quit smoking about 17 years ago. His smoking use included cigarettes. He has a 12.50 pack-year smoking history. He has never used smokeless tobacco.  Past Surgical History:  Procedure Laterality Date  . APPLICATION OF A-CELL OF EXTREMITY Left 12/05/2016   Procedure: APPLICATION OF A-CELL OF EXTREMITY;  Surgeon: Loel Lofty Dillingham, DO;  Location: [facility];  Service: Plastics;  Laterality: Left;  . APPLICATION OF WOUND VAC Left 12/05/2016   Procedure: APPLICATION OF WOUND VAC;  Surgeon: Loel Lofty  Dillingham, DO;  Location: [facility];  Service: Plastics;  Laterality: Left;  . I&D EXTREMITY Left 11/03/2016   Procedure: IRRIGATION AND DEBRIDEMENT EXTREMITY;  Surgeon: Leandrew Koyanagi, MD;  Location: [facility];  Service: Orthopedics;  Laterality: Left;  . I&D EXTREMITY Left 12/05/2016   Procedure: IRRIGATION AND DEBRIDEMENT EXTREMITY;  Surgeon: Loel Lofty Dillingham, DO;  Location: [facility];  Service: Plastics;  Laterality: Left;  . INCISION AND DRAINAGE ABSCESS Right 11/12/2013   Procedure: INCISION AND DRAINAGE AND OPEN PACKING  OF RIGHT CALF  ABSCESS;  Surgeon: Earnstine Regal, MD;  Location: [facility];  Service: General;  Laterality: Right;  . INCISION AND DRAINAGE OF WOUND Left 01/29/2017   Procedure: IRRIGATION AND DEBRIDEMENT OF LEFT LEG WOUND WITH ABRA PLACEMENT AND PLACEMENT OF WOUND VAC;  Surgeon: Wallace Going, DO;  Location: [facility];  Service: Plastics;  Laterality: Left;  . KNEE SURGERY      Allergies  Allergen Reactions  . Propoxyphene Nausea And Vomiting  . Amoxicillin Nausea And Vomiting  . Ciprofloxacin Nausea Only  . Depakote [Divalproex Sodium] Other (See Comments)    hallucinations    Immunization History  Administered Date(s) Administered  . Influenza, High Dose Seasonal PF 06/23/2017  . Influenza, Seasonal, Injecte, Preservative Fre 07/10/2016  . Tdap 11/05/2013    Family History  Problem Relation Age of Onset  . Heart disease Mother   . Stroke Mother   . Heart disease Father   . Stroke Father      Current Outpatient Medications:  .  acetaminophen (TYLENOL) 325 MG tablet, Take 2 tablets (650 mg total) by mouth every 6 (six) hours as needed for mild pain (or Fever >/= 101)., Disp: , Rfl:  .  albuterol (PROVENTIL) (2.5 MG/3ML) 0.083% nebulizer solution, Take 3 mLs (2.5 mg total) by nebulization every 2 (two) hours as needed for wheezing or shortness of breath., Disp: 75 mL, Rfl: 12 .  ARIPiprazole (ABILIFY) 15 MG tablet, , Disp: , Rfl: 3 .  aspirin EC 81 MG EC tablet, Take  1 tablet (81 mg total) by mouth daily., Disp: , Rfl:  .  atenolol (TENORMIN) 25 MG tablet, Take 1 tablet (25 mg total) by mouth every morning., Disp: , Rfl:  .  clonazePAM (KLONOPIN) 0.5 MG tablet, Take 1 tablet (0.5 mg total) by mouth 2 (two) times daily. (Patient taking differently: Take 1 mg by mouth 2 (two) times daily. ), Disp: 30 tablet, Rfl: 0 .  feeding supplement, ENSURE ENLIVE, (ENSURE ENLIVE) LIQD, Take 237 mLs by mouth 2 (two) times daily between meals., Disp: 237 mL, Rfl: 12 .  FLUoxetine (PROZAC) 20 MG capsule, Take 1 capsule (20 mg total) by mouth daily. (Patient taking differently: Take 10 mg by mouth daily. ), Disp: , Rfl: 3 .  fluticasone (FLONASE) 50 MCG/ACT nasal spray, Place 1 spray into both nostrils every morning., Disp: , Rfl: 0 .  ibuprofen (ADVIL,MOTRIN) 600 MG tablet, Take 1 tablet (600 mg total) by mouth 4 (four) times daily., Disp: 30 tablet, Rfl: 0 .  ipratropium-albuterol (DUONEB) 0.5-2.5 (3) MG/3ML SOLN, Take 3 mLs by nebulization 3 (three) times daily., Disp: 360 mL, Rfl:  .  lamoTRIgine (LAMICTAL) 200 MG tablet, Take 1 tablet (200 mg total) by mouth every morning., Disp: , Rfl:  .  silodosin (RAPAFLO) 8 MG CAPS capsule, Take 8 mg daily with breakfast by mouth., Disp: , Rfl:  .  Tiotropium Bromide Monohydrate (SPIRIVA RESPIMAT) 2.5 MCG/ACT AERS, Inhale 2 puffs into the lungs daily., Disp: 1 Inhaler, Rfl: 5    Review of Systems     Objective:   Physical Exam  Constitutional: He is oriented to person, place, and time. He appears well-developed and well-nourished. No distress.  Looks somewhat deconditioned  HENT:  Head: Normocephalic and atraumatic.  Right Ear: External ear normal.  Left Ear: External ear normal.  Mouth/Throat: Oropharynx is clear and moist. No oropharyngeal exudate.  Eyes: Pupils are equal, round, and reactive to light. Conjunctivae and EOM are normal. Right eye  exhibits no discharge. Left eye exhibits no discharge. No scleral icterus.  Neck:  Normal range of motion. Neck supple. No JVD present. No tracheal deviation present. No thyromegaly present.  Cardiovascular: Normal rate, regular rhythm and intact distal pulses. Exam reveals no gallop and no friction rub.  No murmur heard. Pulmonary/Chest: Effort normal and breath sounds normal. No respiratory distress. He has no wheezes. He has no rales. He exhibits no tenderness.  Abdominal: Soft. Bowel sounds are normal. He exhibits no distension and no mass. There is no tenderness. There is no rebound and no guarding.  Musculoskeletal: Normal range of motion. He exhibits no edema or tenderness.  Lymphadenopathy:    He has no cervical adenopathy.  Neurological: He is alert and oriented to person, place, and time. He has normal reflexes. No cranial nerve deficit. Coordination normal.  Skin: Skin is warm and dry. No rash noted. He is not diaphoretic. No erythema. No pallor.  Psychiatric: He has a normal mood and affect. His behavior is normal. Judgment and thought content normal.  Nursing note and vitals reviewed.  Today's Vitals   02/17/18 1037  BP: 118/68  Pulse: (!) 56  SpO2: 97%  Weight: 143 lb 3.2 oz (65 kg)  Height: 5\' 10"  (1.778 m)    Estimated body mass index is 20.55 kg/m as calculated from the following:   Height as of this encounter: 5\' 10"  (1.778 m).   Weight as of this encounter: 143 lb 3.2 oz (65 kg).     Assessment:       ICD-10-CM   1. Pulmonary emphysema, unspecified emphysema type ([place]) J43.9        Plan:      Glad stable with minimal symptoms  Plan Monitor for flare ups like you do Continue spiriva scheduled with albuterol as needed CMA to accurately depict your smoknig history   - to assess qualification for lung cancer screening program referral Re-refer pulmonary rehab  Followup  - jan 2020 or sooner if needed    Dr. Brand Males, M.D., [facility].C.P Pulmonary and Critical Care Medicine Staff Physician, [place] Director -  Interstitial Lung Disease  Program  Pulmonary [place] at [place], [place], [address]  Pager: [phone], If no answer or between  15:00h - 7:00h: call [phone] Telephone: [phone]

## 2018-02-17 NOTE — Patient Instructions (Addendum)
ICD-10-CM   1. Pulmonary emphysema, unspecified emphysema type ([place]) J43.9     Glad stable with minimal symptoms  Plan Monitor for flare ups like you do Continue spiriva scheduled with albuterol as needed CMA to accurately depict your smoknig history   - to assess qualification for lung cancer screening program referral Re-refer pulmonary rehab  Followup  - jan 2020 or sooner if needed

## 2018-02-18 NOTE — Addendum Note (Signed)
Addended by: Lorretta Harp on: 02/18/2018 08:25 AM   Modules accepted: Orders

## 2018-02-19 ENCOUNTER — Encounter (HOSPITAL_COMMUNITY): Payer: 59

## 2018-02-20 NOTE — Addendum Note (Signed)
Encounter addended by: Ivonne Andrew, RD on: 02/20/2018 2:26 PM  Actions taken: Flowsheet data copied forward, Visit Navigator Flowsheet section accepted

## 2018-02-24 ENCOUNTER — Encounter (HOSPITAL_COMMUNITY): Payer: 59

## 2018-02-24 ENCOUNTER — Telehealth: Payer: Self-pay | Admitting: Internal Medicine

## 2018-02-24 NOTE — Telephone Encounter (Signed)
FYI for MR:  I spoke with pt regarding lung cancer screening.  He does not qualify due to quitting smoking in 2002 ( over 15 yrs ago).  Referral has been cancelled.

## 2018-02-26 ENCOUNTER — Encounter (HOSPITAL_COMMUNITY): Payer: 59

## 2018-02-26 DIAGNOSIS — M1711 Unilateral primary osteoarthritis, right knee: Secondary | ICD-10-CM | POA: Diagnosis not present

## 2018-03-01 NOTE — Telephone Encounter (Signed)
k thanks

## 2018-03-04 DIAGNOSIS — F411 Generalized anxiety disorder: Secondary | ICD-10-CM | POA: Diagnosis not present

## 2018-03-04 DIAGNOSIS — F3341 Major depressive disorder, recurrent, in partial remission: Secondary | ICD-10-CM | POA: Diagnosis not present

## 2018-03-04 DIAGNOSIS — F341 Dysthymic disorder: Secondary | ICD-10-CM | POA: Diagnosis not present

## 2018-03-05 ENCOUNTER — Telehealth (HOSPITAL_COMMUNITY): Payer: Self-pay

## 2018-03-05 NOTE — Telephone Encounter (Signed)
Patient called back and stated he would like to participate  in the Pulmonary Rehab Program. Patient will come in for orientation on 04/06/18 @ 9:30AM and will attend the 10:30AM exercise class.  Mailed homework package.

## 2018-03-05 NOTE — Telephone Encounter (Signed)
Attempted to call patient in regards to Pulmonary Rehab - LM with pt wife Stanton Kidney.

## 2018-03-05 NOTE — Telephone Encounter (Signed)
Attempted to call patient in regards to Pulmonary Rehab, pt was not available. Was able to leave a message with pt wife Stanton Kidney, to have the pt to return the call.

## 2018-03-05 NOTE — Telephone Encounter (Signed)
Pt insurance is active and benefits verified through [facility]. Co-pay $45.00, DED $0.00/$0.00 met, out of pocket $0.00/$0.00 met, co-insurance 0%. No pre-authorization required. UMR/[place], 03/05/18 @ 2:00, REF#[id number]  Secondary insurance is active and benefits verified through [facility]. Co-pay $15.00, DED $0.00/$0.00 met, out of pocket $3,400.00/$424.50 met, co-insurance 0%. No pre-authorization required. [employer]/[place], 03/05/18 @ 2:10PM, QIW#[id number]

## 2018-03-10 LAB — PULMONARY FUNCTION TEST
DL/VA % PRED: 50 %
DL/VA: 2.31 ml/min/mmHg/L
DLCO COR % PRED: 41 %
DLCO COR: 13.57 ml/min/mmHg
DLCO unc % pred: 40 %
DLCO unc: 13.05 ml/min/mmHg
FEF 25-75 POST: 3.48 L/s
FEF 25-75 Pre: 2.92 L/sec
FEF2575-%CHANGE-POST: 19 %
FEF2575-%Pred-Post: 128 %
FEF2575-%Pred-Pre: 108 %
FEV1-%Change-Post: 4 %
FEV1-%Pred-Post: 101 %
FEV1-%Pred-Pre: 97 %
FEV1-POST: 3.47 L
FEV1-Pre: 3.32 L
FEV1FVC-%CHANGE-POST: 4 %
FEV1FVC-%Pred-Pre: 102 %
FEV6-%Change-Post: 1 %
FEV6-%PRED-PRE: 97 %
FEV6-%Pred-Post: 98 %
FEV6-POST: 4.29 L
FEV6-PRE: 4.24 L
FEV6FVC-%Change-Post: 0 %
FEV6FVC-%PRED-POST: 104 %
FEV6FVC-%Pred-Pre: 104 %
FVC-%CHANGE-POST: 0 %
FVC-%PRED-PRE: 93 %
FVC-%Pred-Post: 94 %
FVC-POST: 4.32 L
FVC-PRE: 4.3 L
POST FEV6/FVC RATIO: 99 %
Post FEV1/FVC ratio: 80 %
Pre FEV1/FVC ratio: 77 %
Pre FEV6/FVC Ratio: 99 %
RV % pred: 60 %
RV: 1.44 L
TLC % PRED: 81 %
TLC: 5.69 L

## 2018-03-16 DIAGNOSIS — L0291 Cutaneous abscess, unspecified: Secondary | ICD-10-CM | POA: Diagnosis not present

## 2018-03-16 DIAGNOSIS — R2681 Unsteadiness on feet: Secondary | ICD-10-CM | POA: Diagnosis not present

## 2018-03-16 DIAGNOSIS — L0390 Cellulitis, unspecified: Secondary | ICD-10-CM | POA: Diagnosis not present

## 2018-03-16 DIAGNOSIS — J449 Chronic obstructive pulmonary disease, unspecified: Secondary | ICD-10-CM | POA: Diagnosis not present

## 2018-03-16 DIAGNOSIS — J189 Pneumonia, unspecified organism: Secondary | ICD-10-CM | POA: Diagnosis not present

## 2018-03-16 DIAGNOSIS — I5030 Unspecified diastolic (congestive) heart failure: Secondary | ICD-10-CM | POA: Diagnosis not present

## 2018-03-18 ENCOUNTER — Telehealth: Payer: Self-pay | Admitting: Internal Medicine

## 2018-03-18 NOTE — Telephone Encounter (Signed)
Called and spoke with patients wife, per Kathlee Nations Dr. Lovena Le is in network and she is not sure why they would say he is not. Advised patients wife to contact insurance company to see if they could give her more information or go into further detail as to why it is not being covered and we can go from there.

## 2018-03-27 ENCOUNTER — Telehealth: Payer: Self-pay | Admitting: Internal Medicine

## 2018-03-27 NOTE — Telephone Encounter (Signed)
Will give to MR for him to complete.

## 2018-03-30 NOTE — Telephone Encounter (Signed)
Routing message to MR for him to know and to let me know once FMLA paperwork is finished.

## 2018-04-01 NOTE — Telephone Encounter (Signed)
MR please advise if FMLA paperwork has been completed.

## 2018-04-03 NOTE — Telephone Encounter (Signed)
1. Time filled out seemed to end feb 2019. I assume it was mean end date of feb 2020  2. Last year when he was sick I had given 2d/week and 4h per day. Now he is better and has finished rehab and on epic has has had no ER visits/doc visits or hospitalizations . His lung function decrease is mild. So, is wife wating more than 2d/week? If so, why? How much does she want?

## 2018-04-03 NOTE — Telephone Encounter (Signed)
Called and spoke with pt's spouse [name] stating to her the date that as on the forms was 10/2017 and wanted to know if that should've been written as 10/2018.  Per [name], the date should have said 10/2018.  Per pt's spouse, pt is restarting rehab Monday, 04/06/18 and they will find out at that time how long pt will be doing the rehab. After orientation Monday, it has been stated that pt will be going to rehab Tues and Thurs and due to this, she is wanting at least 6 more months of FMLA to cover her time she is going to need off with pt.

## 2018-04-06 ENCOUNTER — Encounter (HOSPITAL_COMMUNITY): Payer: Self-pay

## 2018-04-06 ENCOUNTER — Encounter (HOSPITAL_COMMUNITY)
Admission: RE | Admit: 2018-04-06 | Discharge: 2018-04-06 | Disposition: A | Discharge status: Home / Self Care | Payer: 59 | Source: Ambulatory Visit | Attending: Internal Medicine | Admitting: Internal Medicine

## 2018-04-06 VITALS — BP 137/74 | HR 71 | Temp 97.5°F | Resp 18 | Ht 70.5 in | Wt 145.5 lb

## 2018-04-06 DIAGNOSIS — Z87891 Personal history of nicotine dependence: Secondary | ICD-10-CM | POA: Insufficient documentation

## 2018-04-06 DIAGNOSIS — Z791 Long term (current) use of non-steroidal anti-inflammatories (NSAID): Secondary | ICD-10-CM | POA: Insufficient documentation

## 2018-04-06 DIAGNOSIS — I10 Essential (primary) hypertension: Secondary | ICD-10-CM | POA: Diagnosis not present

## 2018-04-06 DIAGNOSIS — J438 Other emphysema: Secondary | ICD-10-CM | POA: Diagnosis not present

## 2018-04-06 DIAGNOSIS — Z79899 Other long term (current) drug therapy: Secondary | ICD-10-CM | POA: Diagnosis not present

## 2018-04-06 NOTE — Progress Notes (Signed)
[place] 67 y.o. male  DOB: 09-07-1951 MRN: [id number]           Nutrition Note 1. Other emphysema ([place])    Past Medical History:  Diagnosis Date  . Arthritis   . Cervical disc herniation   . COPD (chronic obstructive pulmonary disease) ([place])   . Headache    " due to antibiotics"  . Hepatitis C   . Hypertension    Meds reviewed. Duoneb, spiriva noted  Ht: Ht Readings from Last 1 Encounters:  04/06/18 5' 10.5" (1.791 m)     Wt:  Wt Readings from Last 3 Encounters:  04/06/18 145 lb 8.1 oz (66 kg)  02/17/18 143 lb 3.2 oz (65 kg)  12/23/17 147 lb 0.8 oz (66.7 kg)     BMI: Body mass index is 20.58 kg/m.    Current tobacco use? no  Labs:  Lipid Panel  No results found for: CHOL, TRIG, HDL, CHOLHDL, VLDL, LDLCALC, LDLDIRECT  Lab Results  Component Value Date   HGBA1C 5.9 (H) 06/14/2017    Nutrition Diagnosis ? Excessive sodium intake related to over consumption of processed food as evidenced by frequent consumption of convenience food/ canned vegetables and eating out frequently.   Goal(s)  1. Pt to identify and limit food sources of sodium. 2. The pt will recognize symptoms that can interfere with adequate oral intake, such as shortness of breath, N/V, early satiety, fatigue, ability to secure and prepare food, taste and smell changes, chewing/swallowing difficulties, and/ or pain when eating. 3. The pt will consume high-energy, high-nutrient dense beverages when necessary to compensate for decreased oral intake of solid foods.  Plan:  Pt to attend Pulmonary Nutrition class Will provide client-centered nutrition education as part of interdisciplinary care.    Monitor and Evaluate progress toward nutrition goal with team.   Laurina Bustle, MS, RD, LDN 04/06/2018 1:40 PM

## 2018-04-06 NOTE — Progress Notes (Signed)
[place] 67 y.o. male Pulmonary Rehab Orientation Note Travis Salinas, returns to pulmonary rehab after his discharge from the program back in April.  Pt completed 8 exercise sessions. Travis Salinas arrived today in Cardiac and Pulmonary Rehab for orientation to Pulmonary Rehab. Pt arrived ambulatory from the parking deck off of [address] with his ex wife who is a Marine scientist at [employer]. He does not carry portable oxygen. Per pt, he*uses oxygen at night when going to sleep as needed if he feels he needs it. Pt does not base this on oxygen saturation but by how he feels.  Pt reports his lowes O2 saturation is 93% . Color good, skin warm and dry. Patient is oriented to time and place however he answers questions very slowly and sometimes gets it "mixed up".  Pt ex wife cues him and he is able to then answer questions appropriately.  Pt likes to kid around unsure if this is a way for him to cover up the gaps he is unable to readily provide. Patient's medical history, psychosocial health, and medications reviewed. Psychosocial assessment reveals pt roommate. Pt is currently retired. Pt hobbies include watching TV. Pt reports his stress level is moderate. Areas of stress/anxiety include Health and inability to perform activities such as bowling, playing ping pong and golf.  Pt is afraid he may do something to his knee that will cause him to have surgery and with his lung condition he knows this would be high risk..  Pt exhibits signs of depression by his own comitance.  Signs of depression include hopelessness and sadness and difficulty falling asleep. PHQ2/9 score 3/9. Pt shows fair  coping skills, pt prior history of substance abuse with somewhat positive outlook . Travis Salinas offered emotional support and reassurance. Will continue to monitor and evaluate progress toward psychosocial goal(s) of feeling less stressed about his health. Physical assessment reveals heart rate is normal, breath sounds clear to auscultation, no wheezes,  rales, or rhonchi. Grip strength equal, strong. Distal pulses palpable. Patient reports he does take medications as prescribed. Pt with side effects of his medication that causes him to reposition himself often with crossing his legs and standing periodically due to restlessness.Patient states he follows a Regular diet. The patient has been trying to gain weight by drinking ensure but he is not consistent with this.. Patient's weight will be monitored closely. Demonstration and practice of PLB using pulse oximeter. Patient able to return demonstration satisfactorily. Safety and hand hygiene in the exercise area reviewed with patient. Patient voices understanding of the information reviewed. Department expectations discussed with patient and achievable goals were set. The patient shows enthusiasm about attending the program and we look forward to working with this patient. The patient is scheduled for a 6 min walk test on 7/30 and to begin exercise on 8/6.  45 minutes was spent on a variety of activities such as assessment of the patient, obtaining baseline data including height, weight, BMI, and grip strength, verifying medical history, allergies, and current medications, and teaching patient strategies for performing tasks with less respiratory effort with emphasis on pursed lip breathing. Maurice Small RN, BSN Cardiac and Pulmonary Rehab Nurse Navigator  [phone]

## 2018-04-06 NOTE — Telephone Encounter (Signed)
Rec'd completed forms from [employer] to [employer] via [employer] -pr

## 2018-04-07 ENCOUNTER — Encounter (HOSPITAL_COMMUNITY): Payer: 59

## 2018-04-07 ENCOUNTER — Encounter (HOSPITAL_COMMUNITY)
Admission: RE | Admit: 2018-04-07 | Discharge: 2018-04-07 | Disposition: A | Discharge status: Home / Self Care | Payer: 59 | Source: Ambulatory Visit | Attending: Internal Medicine | Admitting: Internal Medicine

## 2018-04-07 DIAGNOSIS — J438 Other emphysema: Secondary | ICD-10-CM

## 2018-04-07 DIAGNOSIS — Z79899 Other long term (current) drug therapy: Secondary | ICD-10-CM | POA: Diagnosis not present

## 2018-04-07 DIAGNOSIS — Z791 Long term (current) use of non-steroidal anti-inflammatories (NSAID): Secondary | ICD-10-CM | POA: Diagnosis not present

## 2018-04-07 DIAGNOSIS — I10 Essential (primary) hypertension: Secondary | ICD-10-CM | POA: Diagnosis not present

## 2018-04-07 DIAGNOSIS — Z87891 Personal history of nicotine dependence: Secondary | ICD-10-CM | POA: Diagnosis not present

## 2018-04-07 NOTE — Progress Notes (Signed)
Pulmonary Individual Treatment Plan  Patient Details  Name: Travis Salinas MRN: [id number] Date of Birth: November 05, 1950 Referring Provider:     Pulmonary Rehab Walk Test from 04/07/2018 in [place]  Referring Provider  Dr. Chase Caller      Initial Encounter Date:    Pulmonary Rehab Walk Test from 04/07/2018 in [place]  Date  04/07/18      Visit Diagnosis: Other emphysema ([place])  Patient's Home Medications on Admission:   Current Outpatient Medications:  .  acetaminophen (TYLENOL) 325 MG tablet, Take 2 tablets (650 mg total) by mouth every 6 (six) hours as needed for mild pain (or Fever >/= 101)., Disp: , Rfl:  .  albuterol (PROVENTIL) (2.5 MG/3ML) 0.083% nebulizer solution, Take 3 mLs (2.5 mg total) by nebulization every 2 (two) hours as needed for wheezing or shortness of breath., Disp: 75 mL, Rfl: 12 .  ARIPiprazole (ABILIFY) 15 MG tablet, 10 mg. 7/29 dosage decreased to 10 mg, Disp: , Rfl: 3 .  atenolol (TENORMIN) 25 MG tablet, Take 1 tablet (25 mg total) by mouth every morning., Disp: , Rfl:  .  clonazePAM (KLONOPIN) 0.5 MG tablet, Take 1 tablet (0.5 mg total) by mouth 2 (two) times daily. (Patient taking differently: Take 1 mg by mouth 2 (two) times daily. ), Disp: 30 tablet, Rfl: 0 .  feeding supplement, ENSURE ENLIVE, (ENSURE ENLIVE) LIQD, Take 237 mLs by mouth 2 (two) times daily between meals. (Patient not taking: Reported on 04/06/2018), Disp: 237 mL, Rfl: 12 .  FLUoxetine (PROZAC) 20 MG capsule, Take 1 capsule (20 mg total) by mouth daily. (Patient taking differently: Take 10 mg by mouth daily. ), Disp: , Rfl: 3 .  fluticasone (FLONASE) 50 MCG/ACT nasal spray, Place 1 spray into both nostrils every morning., Disp: , Rfl: 0 .  ibuprofen (ADVIL,MOTRIN) 600 MG tablet, Take 1 tablet (600 mg total) by mouth 4 (four) times daily., Disp: 30 tablet, Rfl: 0 .  ipratropium-albuterol (DUONEB) 0.5-2.5 (3) MG/3ML SOLN, Take 3  mLs by nebulization 3 (three) times daily. (Patient taking differently: Take 3 mLs by nebulization 3 (three) times daily as needed. ), Disp: 360 mL, Rfl:  .  lamoTRIgine (LAMICTAL) 200 MG tablet, Take 1 tablet (200 mg total) by mouth every morning., Disp: , Rfl:  .  silodosin (RAPAFLO) 8 MG CAPS capsule, Take 8 mg daily with breakfast by mouth., Disp: , Rfl:  .  Tiotropium Bromide Monohydrate (SPIRIVA RESPIMAT) 2.5 MCG/ACT AERS, Inhale 2 puffs into the lungs daily., Disp: 1 Inhaler, Rfl: 5  Past Medical History: Past Medical History:  Diagnosis Date  . Arthritis   . Cervical disc herniation   . COPD (chronic obstructive pulmonary disease) ([place])   . Headache    " due to antibiotics"  . Hepatitis C   . Hypertension     Tobacco Use: Social History   Tobacco Use  Smoking Status Former Smoker  . Packs/day: 1.00  . Years: 32.00  . Pack years: 32.00  . Types: Cigarettes  . Last attempt to quit: 2002  . Years since quitting: 17.5  Smokeless Tobacco Never Used    Labs: Recent Chemical engineer    Labs for ITP Cardiac and Pulmonary Rehab Latest Ref Rng & Units 05/27/2017 05/27/2017 05/28/2017 05/31/2017 06/14/2017   Hemoglobin A1c 4.8 - 5.6 % - - - - 5.9(H)   PHART 7.350 - 7.450 7.461(H) 7.442 7.408 7.467(H) -   PCO2ART 32.0 - 48.0  mmHg 28.2(L) 29.2(L) 36.4 35.3 -   HCO3 20.0 - 28.0 mmol/L 19.8(L) 19.6(L) 22.6 25.1 -   TCO2 0 - 100 mmol/L - - - - -   ACIDBASEDEF 0.0 - 2.0 mmol/L 2.6(H) 3.2(H) 1.3 - -   O2SAT % 89.1 82.5 89.1 98.7 -      Capillary Blood Glucose: No results found for: GLUCAP   Pulmonary Assessment Scores: Pulmonary Assessment Scores    Row Name 04/07/18 1300         ADL UCSD   ADL Phase  Entry       mMRC Score   mMRC Score  0        Pulmonary Function Assessment:   Exercise Target Goals: Date: 04/07/18  Exercise Program Goal: Individual exercise prescription set using results from initial 6 min walk test and THRR while considering  patient's  activity barriers and safety.    Exercise Prescription Goal: Initial exercise prescription builds to 30-45 minutes a day of aerobic activity, 2-3 days per week.  Home exercise guidelines will be given to patient during program as part of exercise prescription that the participant will acknowledge.  Activity Barriers & Risk Stratification: Activity Barriers & Cardiac Risk Stratification - 04/06/18 1146      Activity Barriers & Cardiac Risk Stratification   Activity Barriers  Joint Problems;Deconditioning;History of Falls;Neck/Spine Problems;Shortness of Breath    Cardiac Risk Stratification  High       6 Minute Walk: 6 Minute Walk    Row Name 04/07/18 1255         6 Minute Walk   Phase  Discharge     Distance  1310 feet     Walk Time  6 minutes     # of Rest Breaks  0     MPH  2.48     METS  2.91     RPE  11     Perceived Dyspnea   1     Symptoms  No     Resting HR  75 bpm     Resting BP  114/60     Resting Oxygen Saturation   97 %     Exercise Oxygen Saturation  during 6 min walk  93 %     Max Ex. HR  100 bpm     Max Ex. BP  130/60       Interval HR   1 Minute HR  90     2 Minute HR  100     3 Minute HR  97     4 Minute HR  100     5 Minute HR  96     6 Minute HR  101     2 Minute Post HR  88     Interval Heart Rate?  Yes       Interval Oxygen   Interval Oxygen?  Yes     Baseline Oxygen Saturation %  97 %     1 Minute Oxygen Saturation %  96 %     1 Minute Liters of Oxygen  0 L     2 Minute Oxygen Saturation %  93 %     2 Minute Liters of Oxygen  0 L     3 Minute Oxygen Saturation %  94 %     3 Minute Liters of Oxygen  0 L     4 Minute Oxygen Saturation %  94 %     4 Minute Liters of Oxygen  0 L     5 Minute Oxygen Saturation %  95 %     5 Minute Liters of Oxygen  0 L     6 Minute Oxygen Saturation %  95 %     6 Minute Liters of Oxygen  0 L     2 Minute Post Oxygen Saturation %  94 %     2 Minute Post Liters of Oxygen  0 L        Oxygen Initial  Assessment: Oxygen Initial Assessment - 04/07/18 1255      Initial 6 min Walk   Oxygen Used  None      Program Oxygen Prescription   Program Oxygen Prescription  None       Oxygen Re-Evaluation:   Oxygen Discharge (Final Oxygen Re-Evaluation):   Initial Exercise Prescription: Initial Exercise Prescription - 04/07/18 1300      Date of Initial Exercise RX and Referring Provider   Date  04/07/18    Referring Provider  Dr. Chase Caller      Bike   Level  0.5    Minutes  17      NuStep   Level  4    SPM  80    Minutes  17    METs  2      Track   Laps  15    Minutes  17      Prescription Details   Frequency (times per week)  2    Duration  Progress to 45 minutes of aerobic exercise without signs/symptoms of physical distress      Intensity   THRR 40-80% of Max Heartrate  62-123    Ratings of Perceived Exertion  11-13    Perceived Dyspnea  0-4      Progression   Progression  Continue progressive overload as per policy without signs/symptoms or physical distress.      Resistance Training   Training Prescription  Yes    Weight  blue bands    Reps  10-15       Perform Capillary Blood Glucose checks as needed.  Exercise Prescription Changes:   Exercise Comments:   Exercise Goals and Review:   Exercise Goals Re-Evaluation : Exercise Goals Re-Evaluation    Row Name 04/06/18 1038             Exercise Goal Re-Evaluation   Exercise Goals Review  Increase Physical Activity;Able to understand and use Dyspnea scale;Understanding of Exercise Prescription;Increase Strength and Stamina;Knowledge and understanding of Target Heart Rate Range (THRR);Able to understand and use rate of perceived exertion (RPE) scale       Expected Outcomes   Through exercise at rehab and at home, patient will increase strength and stamina. Patient will also gain the confidence and knowledge to adhere to a exercise regime at home.          Discharge Exercise Prescription (Final  Exercise Prescription Changes):   Nutrition:  Target Goals: Understanding of nutrition guidelines, daily intake of sodium 1500mg , cholesterol 200mg , calories 30% from fat and 7% or less from saturated fats, daily to have 5 or more servings of fruits and vegetables.  Biometrics:    Nutrition Therapy Plan and Nutrition Goals: Nutrition Therapy & Goals - 04/06/18 1340      Nutrition Therapy   Diet  Low Sodium       Personal Nutrition Goals   Nutrition Goal  pt to identify and limit food sources of sodium    Personal Goal #2  The pt will consume high-energy, high-nutrient dense beverages when necessary to compensate for decreased oral intake of solid foods.      Intervention Plan   Intervention  Prescribe, educate and counsel regarding individualized specific dietary modifications aiming towards targeted core components such as weight, hypertension, lipid management, diabetes, heart failure and other comorbidities.    Expected Outcomes  Short Term Goal: Understand basic principles of dietary content, such as calories, fat, sodium, cholesterol and nutrients.;Long Term Goal: Adherence to prescribed nutrition plan.       Nutrition Assessments: Nutrition Assessments - 04/06/18 1358      Rate Your Plate Scores   Pre Score  57       Nutrition Goals Re-Evaluation:   Nutrition Goals Discharge (Final Nutrition Goals Re-Evaluation):   Psychosocial: Target Goals: Acknowledge presence or absence of significant depression and/or stress, maximize coping skills, provide positive support system. Participant is able to verbalize types and ability to use techniques and skills needed for reducing stress and depression.  Initial Review & Psychosocial Screening: Initial Psych Review & Screening - 04/06/18 1142      Initial Review   Comments  Pt complains of feeling bored.  Pt would like to do more but his shortness of breath keeps him from do much physical activity.  As a result he sits  around the house thinking about all the things he can not do anymore and this makes him feel stressed.  Pt is on prozac, klonipin, abilify       Quality of Life Scores:  Scores of 19 and below usually indicate a poorer quality of life in these areas.  A difference of  2-3 points is a clinically meaningful difference.  A difference of 2-3 points in the total score of the Quality of Life Index has been associated with significant improvement in overall quality of life, self-image, physical symptoms, and general health in studies assessing change in quality of life.   PHQ-9: Recent Review Flowsheet Data    Depression screen [facility] 2/9 04/06/2018 10/27/2017 11/25/2016 09/19/2016 05/15/2016   Decreased Interest 1 1 1 1 1    Down, Depressed, Hopeless 2 2 1 1 1    PHQ - 2 Score 3 3 2 2 2    Altered sleeping 1 0 2 0 3   Tired, decreased energy 3 2 1 1 2    Change in appetite 0 0 2 0 2   Feeling bad or failure about yourself  1 - 1 0 1   Trouble concentrating 0 0 0 0 2   Moving slowly or fidgety/restless 1 0 1 1 2    Suicidal thoughts 0 0 0 0 0   PHQ-9 Score 9 5 9 4 14    Difficult doing work/chores Somewhat difficult Not difficult at all Somewhat difficult Somewhat difficult Very difficult     Interpretation of Total Score  Total Score Depression Severity:  1-4 = Minimal depression, 5-9 = Mild depression, 10-14 = Moderate depression, 15-19 = Moderately severe depression, 20-27 = Severe depression   Psychosocial Evaluation and Intervention: Psychosocial Evaluation - 04/06/18 1145      Psychosocial Evaluation & Interventions   Interventions  Encouraged to exercise with the program and follow exercise prescription    Comments  Pt is hopeful that by participating in pulmonary rehab he will get out of the house more.    Expected Outcomes  patient will remain free from psychosocial barriers to participation in pulmonary rehab    Continue Psychosocial Services   Follow up required  by staff        Psychosocial Re-Evaluation:   Psychosocial Discharge (Final Psychosocial Re-Evaluation):   Education: Education Goals: Education classes will be provided on a weekly basis, covering required topics. Participant will state understanding/return demonstration of topics presented.  Learning Barriers/Preferences: Learning Barriers/Preferences - 04/06/18 1040      Learning Barriers/Preferences   Learning Barriers  Sight       Education Topics: Risk Factor Reduction:  -Group instruction that is supported by a PowerPoint presentation. Instructor discusses the definition of a risk factor, different risk factors for pulmonary disease, and how the heart and lungs work together.     Nutrition for Pulmonary Patient:  -Group instruction provided by PowerPoint slides, verbal discussion, and written materials to support subject matter. The instructor gives an explanation and review of healthy diet recommendations, which includes a discussion on weight management, recommendations for fruit and vegetable consumption, as well as protein, fluid, caffeine, fiber, sodium, sugar, and alcohol. Tips for eating when patients are short of breath are discussed.   Pursed Lip Breathing:  -Group instruction that is supported by demonstration and informational handouts. Instructor discusses the benefits of pursed lip and diaphragmatic breathing and detailed demonstration on how to preform both.     Oxygen Safety:  -Group instruction provided by PowerPoint, verbal discussion, and written material to support subject matter. There is an overview of "What is Oxygen" and "Why do we need it".  Instructor also reviews how to create a safe environment for oxygen use, the importance of using oxygen as prescribed, and the risks of noncompliance. There is a brief discussion on traveling with oxygen and resources the patient may utilize.   PULMONARY REHAB OTHER RESPIRATORY from 01/01/2018 in [place]  Date  12/25/17  Educator  portia  Instruction Review Code  2- Demonstrated Understanding      Oxygen Equipment:  -Group instruction provided by [facility] Staff utilizing handouts, written materials, and equipment demonstrations.   Signs and Symptoms:  -Group instruction provided by written material and verbal discussion to support subject matter. Warning signs and symptoms of infection, stroke, and heart attack are reviewed and when to call the physician/911 reinforced. Tips for preventing the spread of infection discussed.   Advanced Directives:  -Group instruction provided by verbal instruction and written material to support subject matter. Instructor reviews Advanced Directive laws and proper instruction for filling out document.   Pulmonary Video:  -Group video education that reviews the importance of medication and oxygen compliance, exercise, good nutrition, pulmonary hygiene, and pursed lip and diaphragmatic breathing for the pulmonary patient.   Exercise for the Pulmonary Patient:  -Group instruction that is supported by a PowerPoint presentation. Instructor discusses benefits of exercise, core components of exercise, frequency, duration, and intensity of an exercise routine, importance of utilizing pulse oximetry during exercise, safety while exercising, and options of places to exercise outside of rehab.     Pulmonary Medications:  -Verbally interactive group education provided by instructor with focus on inhaled medications and proper administration.   PULMONARY REHAB OTHER RESPIRATORY from 01/01/2018 in [place]  Date  12/04/17  Educator  pharmacist  Instruction Review Code  1- Verbalizes Understanding      Anatomy and Physiology of the Respiratory System and Intimacy:  -Group instruction provided by PowerPoint, verbal discussion, and written material to support subject matter. Instructor reviews respiratory cycle and  anatomical components of the respiratory system and their functions. Instructor also  reviews differences in obstructive and restrictive respiratory diseases with examples of each. Intimacy, Sex, and Sexuality differences are reviewed with a discussion on how relationships can change when diagnosed with pulmonary disease. Common sexual concerns are reviewed.   MD DAY -A group question and answer session with a medical doctor that allows participants to ask questions that relate to their pulmonary disease state.   PULMONARY REHAB OTHER RESPIRATORY from 01/01/2018 in [place]  Date  12/18/17  Educator  yacoub  Instruction Review Code  2- Demonstrated Understanding      OTHER EDUCATION -Group or individual verbal, written, or video instructions that support the educational goals of the pulmonary rehab program.   PULMONARY REHAB OTHER RESPIRATORY from 01/01/2018 in [place]  Date  01/01/18  Educator  Lucianne Lei  Instruction Review Code  2- Demonstrated Understanding      Holiday Eating Survival Tips:  -Group instruction provided by PowerPoint slides, verbal discussion, and written materials to support subject matter. The instructor gives patients tips, tricks, and techniques to help them not only survive but enjoy the holidays despite the onslaught of food that accompanies the holidays.   Knowledge Questionnaire Score:   Core Components/Risk Factors/Patient Goals at Admission: Personal Goals and Risk Factors at Admission - 04/06/18 1140      Core Components/Risk Factors/Patient Goals on Admission   Expected Outcomes  -- Pt feels stressed about his inability to do  activities that he finds pleasurable       Core Components/Risk Factors/Patient Goals Review:    Core Components/Risk Factors/Patient Goals at Discharge (Final Review):    ITP Comments: ITP Comments    Row Name 04/06/18 1015           ITP  Comments  Dr. Jennet Maduro,  Medical Director          Comments:

## 2018-04-09 ENCOUNTER — Encounter (HOSPITAL_COMMUNITY): Payer: 59

## 2018-04-09 ENCOUNTER — Telehealth: Payer: Self-pay | Admitting: Internal Medicine

## 2018-04-09 NOTE — Telephone Encounter (Signed)
Gave the forms to MR for him to refill out and the forms have been handed back to me. I have returned them back to [place].  Routing this message to her for her to follow up on.

## 2018-04-14 ENCOUNTER — Encounter (HOSPITAL_COMMUNITY)
Admission: RE | Admit: 2018-04-14 | Discharge: 2018-04-14 | Disposition: A | Discharge status: Home / Self Care | Payer: 59 | Source: Ambulatory Visit | Attending: Internal Medicine | Admitting: Internal Medicine

## 2018-04-14 ENCOUNTER — Encounter (HOSPITAL_COMMUNITY): Payer: 59

## 2018-04-14 VITALS — Wt 147.9 lb

## 2018-04-14 DIAGNOSIS — Z791 Long term (current) use of non-steroidal anti-inflammatories (NSAID): Secondary | ICD-10-CM | POA: Insufficient documentation

## 2018-04-14 DIAGNOSIS — Z79899 Other long term (current) drug therapy: Secondary | ICD-10-CM | POA: Insufficient documentation

## 2018-04-14 DIAGNOSIS — I10 Essential (primary) hypertension: Secondary | ICD-10-CM | POA: Insufficient documentation

## 2018-04-14 DIAGNOSIS — J441 Chronic obstructive pulmonary disease with (acute) exacerbation: Secondary | ICD-10-CM | POA: Diagnosis not present

## 2018-04-14 DIAGNOSIS — J438 Other emphysema: Secondary | ICD-10-CM | POA: Insufficient documentation

## 2018-04-14 DIAGNOSIS — Z87891 Personal history of nicotine dependence: Secondary | ICD-10-CM | POA: Insufficient documentation

## 2018-04-14 DIAGNOSIS — M545 Low back pain: Secondary | ICD-10-CM | POA: Diagnosis not present

## 2018-04-14 NOTE — Progress Notes (Signed)
Daily Session Note  Patient Details  Name: Travis Salinas MRN: [id number] Date of Birth: 1951/01/15 Referring Provider:     Pulmonary Rehab Walk Test from 04/07/2018 in [place]  Referring Provider  Dr. Chase Caller      Encounter Date: 04/14/2018  Check In: Session Check In - 04/14/18 1527      Check-In   Supervising physician immediately available to respond to emergencies  [facility] immediately available    Physician(s)  Dr. Reesa Chew    Location  [facility]    Staff Present  Su Hilt, MS, ACSM RCEP, Exercise Physiologist;Idolina Mantell Ysidro Evert, RN;Carlette Wilber Oliphant, RN, BSN;Ramon Dredge, RN, MHA    Medication changes reported      No    Fall or balance concerns reported     No    Tobacco Cessation  No Change    Warm-up and Cool-down  Performed as group-led instruction    Resistance Training Performed  Yes    VAD Patient?  No    PAD/SET Patient?  No      Pain Assessment   Currently in Pain?  No/denies    Multiple Pain Sites  No       Capillary Blood Glucose: No results found for this or any previous visit (from the past 24 hour(s)).  Exercise Prescription Changes - 04/14/18 1500      Response to Exercise   Blood Pressure (Admit)  120/60    Blood Pressure (Exercise)  132/72    Blood Pressure (Exit)  122/70    Heart Rate (Admit)  68 bpm    Heart Rate (Exercise)  80 bpm    Heart Rate (Exit)  58 bpm    Oxygen Saturation (Admit)  97 %    Oxygen Saturation (Exercise)  95 %    Oxygen Saturation (Exit)  97 %    Rating of Perceived Exertion (Exercise)  13    Perceived Dyspnea (Exercise)  3    Duration  Progress to 45 minutes of aerobic exercise without signs/symptoms of physical distress    Intensity  Other (comment)      Progression   Progression  Continue to progress workloads to maintain intensity without signs/symptoms of physical distress.      Resistance Training   Training Prescription  Yes    Weight   blue bands    Reps  10-15    Time  10 Minutes      Bike   Level  0.5    Minutes  17      Track   Laps  26    Minutes  17       Social History   Tobacco Use  Smoking Status Former Smoker  . Packs/day: 1.00  . Years: 32.00  . Pack years: 32.00  . Types: Cigarettes  . Last attempt to quit: 2002  . Years since quitting: 17.6  Smokeless Tobacco Never Used    Goals Met:  Exercise tolerated well No report of cardiac concerns or symptoms Strength training completed today  Goals Unmet:  Not Applicable  Comments: Service time is from 1330 to 1520    Dr. Rush Farmer is Medical Director for Pulmonary Rehab at [facility]

## 2018-04-15 ENCOUNTER — Encounter (HOSPITAL_COMMUNITY): Payer: Self-pay | Admitting: *Deleted

## 2018-04-16 ENCOUNTER — Encounter (HOSPITAL_COMMUNITY)
Admission: RE | Admit: 2018-04-16 | Discharge: 2018-04-16 | Disposition: A | Discharge status: Home / Self Care | Payer: 59 | Source: Ambulatory Visit

## 2018-04-16 ENCOUNTER — Encounter (HOSPITAL_COMMUNITY): Payer: 59

## 2018-04-16 ENCOUNTER — Encounter (HOSPITAL_COMMUNITY)
Admission: RE | Admit: 2018-04-16 | Discharge: 2018-04-16 | Disposition: A | Discharge status: Home / Self Care | Payer: 59 | Source: Ambulatory Visit | Attending: Internal Medicine | Admitting: Internal Medicine

## 2018-04-16 DIAGNOSIS — L0291 Cutaneous abscess, unspecified: Secondary | ICD-10-CM | POA: Diagnosis not present

## 2018-04-16 DIAGNOSIS — R2681 Unsteadiness on feet: Secondary | ICD-10-CM | POA: Diagnosis not present

## 2018-04-16 DIAGNOSIS — J189 Pneumonia, unspecified organism: Secondary | ICD-10-CM | POA: Diagnosis not present

## 2018-04-16 DIAGNOSIS — I5030 Unspecified diastolic (congestive) heart failure: Secondary | ICD-10-CM | POA: Diagnosis not present

## 2018-04-16 DIAGNOSIS — J449 Chronic obstructive pulmonary disease, unspecified: Secondary | ICD-10-CM | POA: Diagnosis not present

## 2018-04-16 DIAGNOSIS — J438 Other emphysema: Secondary | ICD-10-CM

## 2018-04-16 DIAGNOSIS — L0390 Cellulitis, unspecified: Secondary | ICD-10-CM | POA: Diagnosis not present

## 2018-04-16 NOTE — Progress Notes (Signed)
[place] 67 y.o. male   DOB: [birth year] MRN: [id number]          Nutrition No diagnosis found. Past Medical History:  Diagnosis Date  . Arthritis   . Cervical disc herniation   . COPD (chronic obstructive pulmonary disease) ([place])   . Headache    " due to antibiotics"  . Hepatitis C   . Hypertension    Meds reviewed. Atenolol, klonopin noted  Ht: Ht Readings from Last 1 Encounters:  04/06/18 5' 10.5" (1.791 m)     Wt:  Wt Readings from Last 3 Encounters:  04/14/18 147 lb 14.9 oz (67.1 kg)  04/06/18 145 lb 8.1 oz (66 kg)  02/17/18 143 lb 3.2 oz (65 kg)     BMI: 20.93    Current tobacco use? no  Labs:  Lipid Panel  No results found for: CHOL, TRIG, HDL, CHOLHDL, VLDL, LDLCALC, LDLDIRECT  Lab Results  Component Value Date   HGBA1C 5.9 (H) 06/14/2017   Note Spoke with pt. Pt shared he would like to gain 10 lbs. As his BMI is close to being under 20. Pt shared he eats 3 meals a day + 1 ensure; Most prepared at home.  Making healthy food choices the majority of the time. Discussed adding two snacks in with patient, to have a total of 6 meals daily. Pt shared he has always been thin, and has "always eaten like a horse, can't seem to put weight on". Discussed with patient that eating several small meals a day would give him the opportunity to consume more protein and calories than currently. Pt's Rate Your Plate results reviewed with pt. Pt does avoid salty food; uses canned/ convenience food.  Pt adds salt to food. The role of sodium in lung disease reviewed with pt. Pt expressed understanding of the information reviewed.  Nutrition Diagnosis ? Excessive sodium intake related to over consumption of processed food as evidenced by frequent consumption of convenience food/ canned vegetables. ? Food-and nutrition-related knowledge deficit related to lack of exposure to information as related to diagnosis of pulmonary disease  Nutrition Intervention ? Pt's individual  nutrition plan and goals reviewed with pt. ? Benefits of adopting healthy eating habits discussed when pt's Rate Your Plate reviewed.  Goal(s) 1. Pt to identify and limit food sources of sodium. 2. The pt will recognize symptoms that can interfere with adequate oral intake, such as shortness of breath, N/V, early satiety, fatigue, ability to secure and prepare food, taste and smell changes, chewing/swallowing difficulties, and/ or pain when eating. 3. The pt will consume high-energy, high-nutrient dense beverages when necessary to compensate for decreased oral intake of solid foods. 4. Identify food quantities necessary to achieve wt maintenance/ gain of  -2# per week to a goal wt loss of 6-15 lb at graduation from pulmonary rehab.  Plan:  Pt to attend Pulmonary Nutrition class Will provide client-centered nutrition education as part of interdisciplinary care.    Monitor and Evaluate progress toward nutrition goal with team.   Laurina Bustle, MS, RD, LDN 04/16/2018 2:56 PM

## 2018-04-16 NOTE — Progress Notes (Signed)
Daily Session Note  Patient Details  Name: Travis Salinas MRN: [id number] Date of Birth: 1951-04-21 Referring Provider:     Pulmonary Rehab Walk Test from 04/07/2018 in [place]  Referring Provider  Dr. Chase Caller      Encounter Date: 04/16/2018  Check In: Session Check In - 04/16/18 [place]      Check-In   Supervising physician immediately available to respond to emergencies  [facility] immediately available    Physician(s)  Dr. Maryland Pink    Location  [facility]    Staff Present  Su Hilt, MS, ACSM RCEP, Exercise Physiologist;Deborah Lazcano Colletta [place], RN, MHA    Medication changes reported      No    Fall or balance concerns reported     No    Tobacco Cessation  No Change    Warm-up and Cool-down  Performed as group-led instruction    Resistance Training Performed  Yes    VAD Patient?  No    PAD/SET Patient?  No      Pain Assessment   Currently in Pain?  No/denies    Multiple Pain Sites  No       Capillary Blood Glucose: No results found for this or any previous visit (from the past 24 hour(s)).    Social History   Tobacco Use  Smoking Status Former Smoker  . Packs/day: 1.00  . Years: 32.00  . Pack years: 32.00  . Types: Cigarettes  . Last attempt to quit: 2002  . Years since quitting: 17.6  Smokeless Tobacco Never Used    Goals Met:  Exercise tolerated well No report of cardiac concerns or symptoms Strength training completed today  Goals Unmet:  Not Applicable  Comments: Service time is from 1330 to 1515    Dr. Rush Farmer is Medical Director for Pulmonary Rehab at [facility].

## 2018-04-21 ENCOUNTER — Telehealth (HOSPITAL_COMMUNITY): Payer: Self-pay

## 2018-04-21 ENCOUNTER — Encounter (HOSPITAL_COMMUNITY): Payer: 59

## 2018-04-23 ENCOUNTER — Encounter (HOSPITAL_COMMUNITY): Payer: 59

## 2018-04-28 ENCOUNTER — Encounter (HOSPITAL_COMMUNITY)
Admission: RE | Admit: 2018-04-28 | Discharge: 2018-04-28 | Disposition: A | Discharge status: Home / Self Care | Payer: 59 | Source: Ambulatory Visit | Attending: Internal Medicine | Admitting: Internal Medicine

## 2018-04-28 ENCOUNTER — Encounter (HOSPITAL_COMMUNITY): Payer: 59

## 2018-04-28 VITALS — Wt 150.1 lb

## 2018-04-28 DIAGNOSIS — I10 Essential (primary) hypertension: Secondary | ICD-10-CM | POA: Diagnosis not present

## 2018-04-28 DIAGNOSIS — J438 Other emphysema: Secondary | ICD-10-CM | POA: Diagnosis not present

## 2018-04-28 DIAGNOSIS — Z79899 Other long term (current) drug therapy: Secondary | ICD-10-CM | POA: Diagnosis not present

## 2018-04-28 DIAGNOSIS — Z87891 Personal history of nicotine dependence: Secondary | ICD-10-CM | POA: Diagnosis not present

## 2018-04-28 DIAGNOSIS — Z791 Long term (current) use of non-steroidal anti-inflammatories (NSAID): Secondary | ICD-10-CM | POA: Diagnosis not present

## 2018-04-28 NOTE — Progress Notes (Signed)
Daily Session Note  Patient Details  Name: Travis Salinas MRN: [id number] Date of Birth: 07/13/1951 Referring Provider:     Pulmonary Rehab Walk Test from 04/07/2018 in [place]  Referring Provider  Dr. Chase Caller      Encounter Date: 04/28/2018  Check In: Session Check In - 04/28/18 1353      Check-In   Supervising physician immediately available to respond to emergencies  [facility] immediately available    Physician(s)  Dr. Florene Glen     Location  [facility]    Staff Present  Su Hilt, MS, ACSM RCEP, Exercise Physiologist;Kemari Narez Ysidro Evert, Felipe Drone, RN, MHA;Olinty Celesta Aver, MS, ACSM CEP, Exercise Physiologist    Medication changes reported      No    Fall or balance concerns reported     No    Tobacco Cessation  No Change    Warm-up and Cool-down  Performed as group-led instruction    Resistance Training Performed  Yes    VAD Patient?  No    PAD/SET Patient?  No      Pain Assessment   Currently in Pain?  No/denies       Capillary Blood Glucose: No results found for this or any previous visit (from the past 24 hour(s)).  Exercise Prescription Changes - 04/28/18 1600      Response to Exercise   Blood Pressure (Admit)  128/78    Blood Pressure (Exercise)  138/82    Blood Pressure (Exit)  122/72    Heart Rate (Admit)  68 bpm    Heart Rate (Exercise)  75 bpm    Heart Rate (Exit)  66 bpm    Oxygen Saturation (Admit)  98 %    Oxygen Saturation (Exercise)  97 %    Oxygen Saturation (Exit)  97 %    Rating of Perceived Exertion (Exercise)  11    Perceived Dyspnea (Exercise)  1    Duration  Progress to 45 minutes of aerobic exercise without signs/symptoms of physical distress    Intensity  THRR unchanged      Progression   Progression  Continue to progress workloads to maintain intensity without signs/symptoms of physical distress.      Resistance Training   Training Prescription  Yes    Weight   blue bands    Reps  10-15    Time  10 Minutes      Bike   Level  3    Minutes  17      Track   Laps  27    Minutes  17       Social History   Tobacco Use  Smoking Status Former Smoker  . Packs/day: 1.00  . Years: 32.00  . Pack years: 32.00  . Types: Cigarettes  . Last attempt to quit: 2002  . Years since quitting: 17.6  Smokeless Tobacco Never Used    Goals Met:  Exercise tolerated well No report of cardiac concerns or symptoms Strength training completed today  Goals Unmet:  Not Applicable  Comments: Service time is from 1330 to 1510    Dr. Rush Farmer is Medical Director for Pulmonary Rehab at [facility].

## 2018-04-29 NOTE — Progress Notes (Signed)
Pulmonary Individual Treatment Plan  Patient Details  Name: Travis Salinas MRN: [id number] Date of Birth: 1950-11-29 Referring Provider:     Pulmonary Rehab Walk Test from 04/07/2018 in [place]  Referring Provider  Dr. Chase Caller      Initial Encounter Date:    Pulmonary Rehab Walk Test from 04/07/2018 in [place]  Date  04/07/18      Visit Diagnosis: Other emphysema ([place])  Patient's Home Medications on Admission:   Current Outpatient Medications:  .  acetaminophen (TYLENOL) 325 MG tablet, Take 2 tablets (650 mg total) by mouth every 6 (six) hours as needed for mild pain (or Fever >/= 101)., Disp: , Rfl:  .  albuterol (PROVENTIL) (2.5 MG/3ML) 0.083% nebulizer solution, Take 3 mLs (2.5 mg total) by nebulization every 2 (two) hours as needed for wheezing or shortness of breath., Disp: 75 mL, Rfl: 12 .  ARIPiprazole (ABILIFY) 15 MG tablet, 10 mg. 7/29 dosage decreased to 10 mg, Disp: , Rfl: 3 .  atenolol (TENORMIN) 25 MG tablet, Take 1 tablet (25 mg total) by mouth every morning., Disp: , Rfl:  .  clonazePAM (KLONOPIN) 0.5 MG tablet, Take 1 tablet (0.5 mg total) by mouth 2 (two) times daily. (Patient taking differently: Take 1 mg by mouth 2 (two) times daily. ), Disp: 30 tablet, Rfl: 0 .  feeding supplement, ENSURE ENLIVE, (ENSURE ENLIVE) LIQD, Take 237 mLs by mouth 2 (two) times daily between meals. (Patient not taking: Reported on 04/06/2018), Disp: 237 mL, Rfl: 12 .  FLUoxetine (PROZAC) 20 MG capsule, Take 1 capsule (20 mg total) by mouth daily. (Patient taking differently: Take 10 mg by mouth daily. ), Disp: , Rfl: 3 .  fluticasone (FLONASE) 50 MCG/ACT nasal spray, Place 1 spray into both nostrils every morning., Disp: , Rfl: 0 .  ibuprofen (ADVIL,MOTRIN) 600 MG tablet, Take 1 tablet (600 mg total) by mouth 4 (four) times daily., Disp: 30 tablet, Rfl: 0 .  ipratropium-albuterol (DUONEB) 0.5-2.5 (3) MG/3ML SOLN, Take 3  mLs by nebulization 3 (three) times daily. (Patient taking differently: Take 3 mLs by nebulization 3 (three) times daily as needed. ), Disp: 360 mL, Rfl:  .  lamoTRIgine (LAMICTAL) 200 MG tablet, Take 1 tablet (200 mg total) by mouth every morning., Disp: , Rfl:  .  silodosin (RAPAFLO) 8 MG CAPS capsule, Take 8 mg daily with breakfast by mouth., Disp: , Rfl:  .  Tiotropium Bromide Monohydrate (SPIRIVA RESPIMAT) 2.5 MCG/ACT AERS, Inhale 2 puffs into the lungs daily., Disp: 1 Inhaler, Rfl: 5  Past Medical History: Past Medical History:  Diagnosis Date  . Arthritis   . Cervical disc herniation   . COPD (chronic obstructive pulmonary disease) ([facility])   . Headache    " due to antibiotics"  . Hepatitis C   . Hypertension     Tobacco Use: Social History   Tobacco Use  Smoking Status Former Smoker  . Packs/day: 1.00  . Years: 32.00  . Pack years: 32.00  . Types: Cigarettes  . Last attempt to quit: 2002  . Years since quitting: 17.6  Smokeless Tobacco Never Used    Labs: Recent Chemical engineer    Labs for ITP Cardiac and Pulmonary Rehab Latest Ref Rng & Units 05/27/2017 05/27/2017 05/28/2017 05/31/2017 06/14/2017   Hemoglobin A1c 4.8 - 5.6 % - - - - 5.9(H)   PHART 7.350 - 7.450 7.461(H) 7.442 7.408 7.467(H) -   PCO2ART 32.0 - 48.0  mmHg 28.2(L) 29.2(L) 36.4 35.3 -   HCO3 20.0 - 28.0 mmol/L 19.8(L) 19.6(L) 22.6 25.1 -   TCO2 0 - 100 mmol/L - - - - -   ACIDBASEDEF 0.0 - 2.0 mmol/L 2.6(H) 3.2(H) 1.3 - -   O2SAT % 89.1 82.5 89.1 98.7 -      Capillary Blood Glucose: No results found for: GLUCAP   Pulmonary Assessment Scores: Pulmonary Assessment Scores    Row Name 04/07/18 1300 04/15/18 1045       ADL UCSD   ADL Phase  Entry  Entry    SOB Score total  -  63      CAT Score   CAT Score  -  11      mMRC Score   mMRC Score  0  -       Pulmonary Function Assessment:   Exercise Target Goals: Exercise Program Goal: Individual exercise prescription set using results from  initial 6 min walk test and THRR while considering  patient's activity barriers and safety.   Exercise Prescription Goal: Initial exercise prescription builds to 30-45 minutes a day of aerobic activity, 2-3 days per week.  Home exercise guidelines will be given to patient during program as part of exercise prescription that the participant will acknowledge.  Activity Barriers & Risk Stratification: Activity Barriers & Cardiac Risk Stratification - 04/06/18 1146      Activity Barriers & Cardiac Risk Stratification   Activity Barriers  Joint Problems;Deconditioning;History of Falls;Neck/Spine Problems;Shortness of Breath    Cardiac Risk Stratification  High       6 Minute Walk: 6 Minute Walk    Row Name 04/07/18 1255         6 Minute Walk   Phase  Discharge     Distance  1310 feet     Walk Time  6 minutes     # of Rest Breaks  0     MPH  2.48     METS  2.91     RPE  11     Perceived Dyspnea   1     Symptoms  No     Resting HR  75 bpm     Resting BP  114/60     Resting Oxygen Saturation   97 %     Exercise Oxygen Saturation  during 6 min walk  93 %     Max Ex. HR  100 bpm     Max Ex. BP  130/60       Interval HR   1 Minute HR  90     2 Minute HR  100     3 Minute HR  97     4 Minute HR  100     5 Minute HR  96     6 Minute HR  101     2 Minute Post HR  88     Interval Heart Rate?  Yes       Interval Oxygen   Interval Oxygen?  Yes     Baseline Oxygen Saturation %  97 %     1 Minute Oxygen Saturation %  96 %     1 Minute Liters of Oxygen  0 L     2 Minute Oxygen Saturation %  93 %     2 Minute Liters of Oxygen  0 L     3 Minute Oxygen Saturation %  94 %     3 Minute Liters of Oxygen  0 L     4 Minute Oxygen Saturation %  94 %     4 Minute Liters of Oxygen  0 L     5 Minute Oxygen Saturation %  95 %     5 Minute Liters of Oxygen  0 L     6 Minute Oxygen Saturation %  95 %     6 Minute Liters of Oxygen  0 L     2 Minute Post Oxygen Saturation %  94 %     2  Minute Post Liters of Oxygen  0 L        Oxygen Initial Assessment: Oxygen Initial Assessment - 04/07/18 1255      Initial 6 min Walk   Oxygen Used  None      Program Oxygen Prescription   Program Oxygen Prescription  None       Oxygen Re-Evaluation: Oxygen Re-Evaluation    Row Name 04/28/18 0734             Program Oxygen Prescription   Program Oxygen Prescription  None         Home Oxygen   Home Oxygen Device  Portable Concentrator       Sleep Oxygen Prescription  Continuous       Liters per minute  2       Home Exercise Oxygen Prescription  None       Home at Rest Exercise Oxygen Prescription  None       Compliance with Home Oxygen Use  Yes         Goals/Expected Outcomes   Goals/Expected Outcomes  Patient to maintain compliance with night o2          Oxygen Discharge (Final Oxygen Re-Evaluation): Oxygen Re-Evaluation - 04/28/18 0734      Program Oxygen Prescription   Program Oxygen Prescription  None      Home Oxygen   Home Oxygen Device  Portable Concentrator    Sleep Oxygen Prescription  Continuous    Liters per minute  2    Home Exercise Oxygen Prescription  None    Home at Rest Exercise Oxygen Prescription  None    Compliance with Home Oxygen Use  Yes      Goals/Expected Outcomes   Goals/Expected Outcomes  Patient to maintain compliance with night o2       Initial Exercise Prescription: Initial Exercise Prescription - 04/07/18 1300      Date of Initial Exercise RX and Referring Provider   Date  04/07/18    Referring Provider  Dr. Chase Caller      Bike   Level  0.5    Minutes  17      NuStep   Level  4    SPM  80    Minutes  17    METs  2      Track   Laps  15    Minutes  17      Prescription Details   Frequency (times per week)  2    Duration  Progress to 45 minutes of aerobic exercise without signs/symptoms of physical distress      Intensity   THRR 40-80% of Max Heartrate  62-123    Ratings of Perceived Exertion  11-13     Perceived Dyspnea  0-4      Progression   Progression  Continue progressive overload as per policy without signs/symptoms or physical distress.      Horticulturist, commercial  Prescription  Yes    Weight  blue bands    Reps  10-15       Perform Capillary Blood Glucose checks as needed.  Exercise Prescription Changes:  Exercise Prescription Changes    Row Name 04/14/18 1500 04/28/18 1600           Response to Exercise   Blood Pressure (Admit)  120/60  128/78      Blood Pressure (Exercise)  132/72  138/82      Blood Pressure (Exit)  122/70  122/72      Heart Rate (Admit)  68 bpm  68 bpm      Heart Rate (Exercise)  80 bpm  75 bpm      Heart Rate (Exit)  58 bpm  66 bpm      Oxygen Saturation (Admit)  97 %  98 %      Oxygen Saturation (Exercise)  95 %  97 %      Oxygen Saturation (Exit)  97 %  97 %      Rating of Perceived Exertion (Exercise)  13  11      Perceived Dyspnea (Exercise)  3  1      Duration  Progress to 45 minutes of aerobic exercise without signs/symptoms of physical distress  Progress to 45 minutes of aerobic exercise without signs/symptoms of physical distress      Intensity  Other (comment)  THRR unchanged        Progression   Progression  Continue to progress workloads to maintain intensity without signs/symptoms of physical distress.  Continue to progress workloads to maintain intensity without signs/symptoms of physical distress.        Resistance Training   Training Prescription  Yes  Yes      Weight  blue bands  blue bands      Reps  10-15  10-15      Time  10 Minutes  10 Minutes        Bike   Level  0.5  3      Minutes  17  17        Track   Laps  26  27      Minutes  17  17         Exercise Comments:   Exercise Goals and Review:   Exercise Goals Re-Evaluation : Exercise Goals Re-Evaluation    Row Name 04/06/18 1038 04/28/18 0734           Exercise Goal Re-Evaluation   Exercise Goals Review  Increase Physical Activity;Able to  understand and use Dyspnea scale;Understanding of Exercise Prescription;Increase Strength and Stamina;Knowledge and understanding of Target Heart Rate Range (THRR);Able to understand and use rate of perceived exertion (RPE) scale  Increase Physical Activity;Able to understand and use Dyspnea scale;Understanding of Exercise Prescription;Increase Strength and Stamina;Knowledge and understanding of Target Heart Rate Range (THRR);Able to understand and use rate of perceived exertion (RPE) scale      Comments  -  Patient has only attended 2 rehab sessions. Attendance has been consistent due to not always having transportation. Will cont. to monitor and motivate as able.      Expected Outcomes   Through exercise at rehab and at home, patient will increase strength and stamina. Patient will also gain the confidence and knowledge to adhere to a exercise regime at home.   Through exercise at rehab and at home, patient will increase strength and stamina. Patient will also gain the confidence and knowledge  to adhere to a exercise regime at home.         Discharge Exercise Prescription (Final Exercise Prescription Changes): Exercise Prescription Changes - 04/28/18 1600      Response to Exercise   Blood Pressure (Admit)  128/78    Blood Pressure (Exercise)  138/82    Blood Pressure (Exit)  122/72    Heart Rate (Admit)  68 bpm    Heart Rate (Exercise)  75 bpm    Heart Rate (Exit)  66 bpm    Oxygen Saturation (Admit)  98 %    Oxygen Saturation (Exercise)  97 %    Oxygen Saturation (Exit)  97 %    Rating of Perceived Exertion (Exercise)  11    Perceived Dyspnea (Exercise)  1    Duration  Progress to 45 minutes of aerobic exercise without signs/symptoms of physical distress    Intensity  THRR unchanged      Progression   Progression  Continue to progress workloads to maintain intensity without signs/symptoms of physical distress.      Resistance Training   Training Prescription  Yes    Weight  blue bands     Reps  10-15    Time  10 Minutes      Bike   Level  3    Minutes  17      Track   Laps  27    Minutes  17       Nutrition:  Target Goals: Understanding of nutrition guidelines, daily intake of sodium 1500mg , cholesterol 200mg , calories 30% from fat and 7% or less from saturated fats, daily to have 5 or more servings of fruits and vegetables.  Biometrics:    Nutrition Therapy Plan and Nutrition Goals: Nutrition Therapy & Goals - 04/06/18 1340      Nutrition Therapy   Diet  Low Sodium       Personal Nutrition Goals   Nutrition Goal  pt to identify and limit food sources of sodium    Personal Goal #2  The pt will consume high-energy, high-nutrient dense beverages when necessary to compensate for decreased oral intake of solid foods.      Intervention Plan   Intervention  Prescribe, educate and counsel regarding individualized specific dietary modifications aiming towards targeted core components such as weight, hypertension, lipid management, diabetes, heart failure and other comorbidities.    Expected Outcomes  Short Term Goal: Understand basic principles of dietary content, such as calories, fat, sodium, cholesterol and nutrients.;Long Term Goal: Adherence to prescribed nutrition plan.       Nutrition Assessments: Nutrition Assessments - 04/06/18 1358      Rate Your Plate Scores   Pre Score  57       Nutrition Goals Re-Evaluation: Nutrition Goals Re-Evaluation    [place] Name 04/16/18 1503             Goals   Nutrition Goal  pt to identify and limit food sources of sodium       Comment  Identify food quantities necessary to achieve wt maintenance/ gain of  -2# per week to a goal wt loss of 6-15 lb at graduation from pulmonary rehab.         Personal Goal #2 Re-Evaluation   Personal Goal #2  The pt will consume high-energy, high-nutrient dense beverages when necessary to compensate for decreased oral intake of solid foods.          Nutrition Goals  Discharge (Final Nutrition Goals Re-Evaluation): Nutrition  Goals Re-Evaluation - 04/16/18 1503      Goals   Nutrition Goal  pt to identify and limit food sources of sodium    Comment  Identify food quantities necessary to achieve wt maintenance/ gain of  -2# per week to a goal wt loss of 6-15 lb at graduation from pulmonary rehab.      Personal Goal #2 Re-Evaluation   Personal Goal #2  The pt will consume high-energy, high-nutrient dense beverages when necessary to compensate for decreased oral intake of solid foods.       Psychosocial: Target Goals: Acknowledge presence or absence of significant depression and/or stress, maximize coping skills, provide positive support system. Participant is able to verbalize types and ability to use techniques and skills needed for reducing stress and depression.  Initial Review & Psychosocial Screening: Initial Psych Review & Screening - 04/06/18 1142      Initial Review   Comments  Pt complains of feeling bored.  Pt would like to do more but his shortness of breath keeps him from do much physical activity.  As a result he sits around the house thinking about all the things he can not do anymore and this makes him feel stressed.  Pt is on prozac, klonipin, abilify       Quality of Life Scores:  Scores of 19 and below usually indicate a poorer quality of life in these areas.  A difference of  2-3 points is a clinically meaningful difference.  A difference of 2-3 points in the total score of the Quality of Life Index has been associated with significant improvement in overall quality of life, self-image, physical symptoms, and general health in studies assessing change in quality of life.  PHQ-9: Recent Review Flowsheet Data    Depression screen [facility] 2/9 04/06/2018 10/27/2017 11/25/2016 09/19/2016 05/15/2016   Decreased Interest 1 1 1 1 1    Down, Depressed, Hopeless 2 2 1 1 1    PHQ - 2 Score 3 3 2 2 2    Altered sleeping 1 0 2 0 3   Tired, decreased  energy 3 2 1 1 2    Change in appetite 0 0 2 0 2   Feeling bad or failure about yourself  1 - 1 0 1   Trouble concentrating 0 0 0 0 2   Moving slowly or fidgety/restless 1 0 1 1 2    Suicidal thoughts 0 0 0 0 0   PHQ-9 Score 9 5 9 4 14    Difficult doing work/chores Somewhat difficult Not difficult at all Somewhat difficult Somewhat difficult Very difficult     Interpretation of Total Score  Total Score Depression Severity:  1-4 = Minimal depression, 5-9 = Mild depression, 10-14 = Moderate depression, 15-19 = Moderately severe depression, 20-27 = Severe depression   Psychosocial Evaluation and Intervention: Psychosocial Evaluation - 04/29/18 1051      Psychosocial Evaluation & Interventions   Interventions  Encouraged to exercise with the program and follow exercise prescription    Comments  Pt is hopeful that by participating in pulmonary rehab he will get out of the house more.    Expected Outcomes  patient will remain free from psychosocial barriers to participation in pulmonary rehab    Continue Psychosocial Services   Follow up required by staff       Psychosocial Re-Evaluation: Psychosocial Re-Evaluation    [place] Name 04/29/18 1051 04/29/18 1052           Psychosocial Re-Evaluation   Current issues with  Current Depression;History of Depression;Current Psychotropic Meds  Current Depression;Current Sleep Concerns;Current Stress Concerns      Comments  -  Pt feels he has depression because he is unable to drive to get out and do for himself.  He must depend on others to get him where he needs to go.  Pt is hopeful that participating in pulmonary rehab will get him out of the house more.  Engages with fellow classmates at times.      Expected Outcomes  patient will remain free from psychosocial barriers to participation in pulmonary rehab  patient will remain free from psychosocial barriers to participation in pulmonary rehab      Interventions  Encouraged to attend Pulmonary  Rehabilitation for the exercise;Relaxation education;Stress management education  Encouraged to attend Pulmonary Rehabilitation for the exercise;Relaxation education;Stress management education      Continue Psychosocial Services   Follow up required by staff  Follow up required by staff         Psychosocial Discharge (Final Psychosocial Re-Evaluation): Psychosocial Re-Evaluation - 04/29/18 1052      Psychosocial Re-Evaluation   Current issues with  Current Depression;Current Sleep Concerns;Current Stress Concerns    Comments  Pt feels he has depression because he is unable to drive to get out and do for himself.  He must depend on others to get him where he needs to go.  Pt is hopeful that participating in pulmonary rehab will get him out of the house more.  Engages with fellow classmates at times.    Expected Outcomes  patient will remain free from psychosocial barriers to participation in pulmonary rehab    Interventions  Encouraged to attend Pulmonary Rehabilitation for the exercise;Relaxation education;Stress management education    Continue Psychosocial Services   Follow up required by staff       Education: Education Goals: Education classes will be provided on a weekly basis, covering required topics. Participant will state understanding/return demonstration of topics presented.  Learning Barriers/Preferences: Learning Barriers/Preferences - 04/06/18 1040      Learning Barriers/Preferences   Learning Barriers  Sight       Education Topics: Risk Factor Reduction:  -Group instruction that is supported by a PowerPoint presentation. Instructor discusses the definition of a risk factor, different risk factors for pulmonary disease, and how the heart and lungs work together.     Nutrition for Pulmonary Patient:  -Group instruction provided by PowerPoint slides, verbal discussion, and written materials to support subject matter. The instructor gives an explanation and review of  healthy diet recommendations, which includes a discussion on weight management, recommendations for fruit and vegetable consumption, as well as protein, fluid, caffeine, fiber, sodium, sugar, and alcohol. Tips for eating when patients are short of breath are discussed.   Pursed Lip Breathing:  -Group instruction that is supported by demonstration and informational handouts. Instructor discusses the benefits of pursed lip and diaphragmatic breathing and detailed demonstration on how to preform both.     Oxygen Safety:  -Group instruction provided by PowerPoint, verbal discussion, and written material to support subject matter. There is an overview of "What is Oxygen" and "Why do we need it".  Instructor also reviews how to create a safe environment for oxygen use, the importance of using oxygen as prescribed, and the risks of noncompliance. There is a brief discussion on traveling with oxygen and resources the patient may utilize.   PULMONARY REHAB OTHER RESPIRATORY from 01/01/2018 in [place]  Date  12/25/17  Educator  portia  Instruction Review Code  2- Demonstrated Understanding      Oxygen Equipment:  -Group instruction provided by [employer] Staff utilizing handouts, written materials, and equipment demonstrations.   Signs and Symptoms:  -Group instruction provided by written material and verbal discussion to support subject matter. Warning signs and symptoms of infection, stroke, and heart attack are reviewed and when to call the physician/911 reinforced. Tips for preventing the spread of infection discussed.   Advanced Directives:  -Group instruction provided by verbal instruction and written material to support subject matter. Instructor reviews Advanced Directive laws and proper instruction for filling out document.   Pulmonary Video:  -Group video education that reviews the importance of medication and oxygen compliance, exercise, good  nutrition, pulmonary hygiene, and pursed lip and diaphragmatic breathing for the pulmonary patient.   Exercise for the Pulmonary Patient:  -Group instruction that is supported by a PowerPoint presentation. Instructor discusses benefits of exercise, core components of exercise, frequency, duration, and intensity of an exercise routine, importance of utilizing pulse oximetry during exercise, safety while exercising, and options of places to exercise outside of rehab.     Pulmonary Medications:  -Verbally interactive group education provided by instructor with focus on inhaled medications and proper administration.   PULMONARY REHAB OTHER RESPIRATORY from 01/01/2018 in [facility]  Date  12/04/17  Educator  pharmacist  Instruction Review Code  1- Verbalizes Understanding      Anatomy and Physiology of the Respiratory System and Intimacy:  -Group instruction provided by PowerPoint, verbal discussion, and written material to support subject matter. Instructor reviews respiratory cycle and anatomical components of the respiratory system and their functions. Instructor also reviews differences in obstructive and restrictive respiratory diseases with examples of each. Intimacy, Sex, and Sexuality differences are reviewed with a discussion on how relationships can change when diagnosed with pulmonary disease. Common sexual concerns are reviewed.   MD DAY -A group question and answer session with a medical doctor that allows participants to ask questions that relate to their pulmonary disease state.   PULMONARY REHAB OTHER RESPIRATORY from 04/14/2018 in [place]  Date  04/14/18  Educator  Dr. Nelda Marseille  Instruction Review Code  1- Verbalizes Understanding      OTHER EDUCATION -Group or individual verbal, written, or video instructions that support the educational goals of the pulmonary rehab program.   PULMONARY REHAB OTHER RESPIRATORY  from 01/01/2018 in [place]  Date  01/01/18  Educator  Lucianne Lei  Instruction Review Code  2- Demonstrated Understanding      Holiday Eating Survival Tips:  -Group instruction provided by PowerPoint slides, verbal discussion, and written materials to support subject matter. The instructor gives patients tips, tricks, and techniques to help them not only survive but enjoy the holidays despite the onslaught of food that accompanies the holidays.   Knowledge Questionnaire Score: Knowledge Questionnaire Score - 04/15/18 1045      Knowledge Questionnaire Score   Pre Score  17/18       Core Components/Risk Factors/Patient Goals at Admission: Personal Goals and Risk Factors at Admission - 04/06/18 1140      Core Components/Risk Factors/Patient Goals on Admission   Expected Outcomes  --   Pt feels stressed about his inability to do  activities that he finds pleasurable      Core Components/Risk Factors/Patient Goals Review:  Goals and Risk Factor Review    Row Name  04/29/18 1034 04/29/18 1035           Core Components/Risk Factors/Patient Goals Review   Personal Goals Review  Weight Management/Obesity;Improve shortness of breath with ADL's;Increase knowledge of respiratory medications and ability to use respiratory devices properly.;Develop more efficient breathing techniques such as purse lipped breathing and diaphragmatic breathing and practicing self-pacing with activity.  Weight Management/Obesity;Improve shortness of breath with ADL's;Increase knowledge of respiratory medications and ability to use respiratory devices properly.;Develop more efficient breathing techniques such as purse lipped breathing and diaphragmatic breathing and practicing self-pacing with activity.;Stress      Review  -  Pt has completed 3 exercise sessions since 8/6.  Pt returns to pulmonary rehab.  Pt with fair attendance and often has to miss due to transportation.  Pt  depends upon his wife who is also still working for rides to rehab.  Pt aware on proper breathing techniques, needs some reminders from time to time this is stressful for pt.  Pt workloads show slight increase from first day.  airdyne increaed to level 1.2, nustep level 4 and laps around the track 26-27 laps.  I anticipate pt will show measurable progress toward pulmonary rehab goals in the the next 30 day assessment      Expected Outcomes  -  See Admission Goals/Outcomes         Core Components/Risk Factors/Patient Goals at Discharge (Final Review):  Goals and Risk Factor Review - 04/29/18 1035      Core Components/Risk Factors/Patient Goals Review   Personal Goals Review  Weight Management/Obesity;Improve shortness of breath with ADL's;Increase knowledge of respiratory medications and ability to use respiratory devices properly.;Develop more efficient breathing techniques such as purse lipped breathing and diaphragmatic breathing and practicing self-pacing with activity.;Stress    Review  Pt has completed 3 exercise sessions since 8/6.  Pt returns to pulmonary rehab.  Pt with fair attendance and often has to miss due to transportation.  Pt depends upon his wife who is also still working for rides to rehab.  Pt aware on proper breathing techniques, needs some reminders from time to time this is stressful for pt.  Pt workloads show slight increase from first day.  airdyne increaed to level 1.2, nustep level 4 and laps around the track 26-27 laps.  I anticipate pt will show measurable progress toward pulmonary rehab goals in the the next 30 day assessment    Expected Outcomes  See Admission Goals/Outcomes       ITP Comments: ITP Comments    Row Name 04/06/18 1015 04/29/18 1034         ITP Comments  Dr. Jennet Maduro,  Medical Director  Dr. Jennet Maduro,  Medical Director         Comments:  Pt has completed 3 exercise sessions since 04/14/18.  Cherre Huger, BSN Cardiac and Dealer

## 2018-04-30 ENCOUNTER — Encounter (HOSPITAL_COMMUNITY)
Admission: RE | Admit: 2018-04-30 | Discharge: 2018-04-30 | Disposition: A | Discharge status: Home / Self Care | Payer: 59 | Source: Ambulatory Visit | Attending: Internal Medicine | Admitting: Internal Medicine

## 2018-04-30 ENCOUNTER — Encounter (HOSPITAL_COMMUNITY): Payer: 59

## 2018-04-30 DIAGNOSIS — Z791 Long term (current) use of non-steroidal anti-inflammatories (NSAID): Secondary | ICD-10-CM | POA: Diagnosis not present

## 2018-04-30 DIAGNOSIS — Z87891 Personal history of nicotine dependence: Secondary | ICD-10-CM | POA: Diagnosis not present

## 2018-04-30 DIAGNOSIS — Z79899 Other long term (current) drug therapy: Secondary | ICD-10-CM | POA: Diagnosis not present

## 2018-04-30 DIAGNOSIS — J438 Other emphysema: Secondary | ICD-10-CM

## 2018-04-30 DIAGNOSIS — I10 Essential (primary) hypertension: Secondary | ICD-10-CM | POA: Diagnosis not present

## 2018-04-30 NOTE — Progress Notes (Signed)
Daily Session Note  Patient Details  Name: Travis Salinas MRN: [id number] Date of Birth: [birth year] Referring Provider:     Pulmonary Rehab Walk Test from 04/07/2018 in [place]  Referring Provider  Dr. Chase Caller      Encounter Date: 04/30/2018  Check In: Session Check In - 04/30/18 1415      Check-In   Supervising physician immediately available to respond to emergencies  [facility] immediately available    Physician(s)  Dr. Exie Parody    Location  [facility]    Staff Present  Su Hilt, MS, ACSM RCEP, Exercise Physiologist;Carlette Wilber Oliphant, RN, BSN;Viviana Trimble Ysidro Evert, Felipe Drone, RN, MHA    Medication changes reported      No    Fall or balance concerns reported     No    Tobacco Cessation  No Change    Warm-up and Cool-down  Performed as group-led instruction    Resistance Training Performed  Yes    VAD Patient?  No    PAD/SET Patient?  No      Pain Assessment   Currently in Pain?  No/denies    Multiple Pain Sites  No       Capillary Blood Glucose: No results found for this or any previous visit (from the past 24 hour(s)).    Social History   Tobacco Use  Smoking Status Former Smoker  . Packs/day: 1.00  . Years: 32.00  . Pack years: 32.00  . Types: Cigarettes  . Last attempt to quit: 2002  . Years since quitting: 17.6  Smokeless Tobacco Never Used    Goals Met:  Exercise tolerated well No report of cardiac concerns or symptoms Strength training completed today  Goals Unmet:  Not Applicable  Comments: Service time is from 1330 to 1530    Dr. Rush Farmer is Medical Director for Pulmonary Rehab at [facility]

## 2018-05-05 ENCOUNTER — Encounter (HOSPITAL_COMMUNITY): Payer: 59

## 2018-05-05 ENCOUNTER — Encounter (HOSPITAL_COMMUNITY)
Admission: RE | Admit: 2018-05-05 | Discharge: 2018-05-05 | Disposition: A | Discharge status: Home / Self Care | Payer: 59 | Source: Ambulatory Visit | Attending: Internal Medicine | Admitting: Internal Medicine

## 2018-05-05 DIAGNOSIS — J438 Other emphysema: Secondary | ICD-10-CM

## 2018-05-05 DIAGNOSIS — Z87891 Personal history of nicotine dependence: Secondary | ICD-10-CM | POA: Diagnosis not present

## 2018-05-05 DIAGNOSIS — I10 Essential (primary) hypertension: Secondary | ICD-10-CM | POA: Diagnosis not present

## 2018-05-05 DIAGNOSIS — Z79899 Other long term (current) drug therapy: Secondary | ICD-10-CM | POA: Diagnosis not present

## 2018-05-05 DIAGNOSIS — Z791 Long term (current) use of non-steroidal anti-inflammatories (NSAID): Secondary | ICD-10-CM | POA: Diagnosis not present

## 2018-05-05 NOTE — Progress Notes (Signed)
Daily Session Note  Patient Details  Name: Obinna Ehresman MRN: [id number] Date of Birth: 09-05-1951 Referring Provider:     Pulmonary Rehab Walk Test from 04/07/2018 in [place]  Referring Provider  Dr. Chase Caller      Encounter Date: 05/05/2018  Check In: Session Check In - 05/05/18 1527      Check-In   Supervising physician immediately available to respond to emergencies  [facility] immediately available    Physician(s)  Dr. Jonnie Finner    Location  [facility]    Staff Present  Maurice Small, RN, BSN;Molly DiVincenzo, MS, ACSM RCEP, Exercise Physiologist;Reshawn Ostlund Ysidro Evert, Felipe Drone, RN, MHA    Medication changes reported      No    Fall or balance concerns reported     No    Tobacco Cessation  No Change    Warm-up and Cool-down  Performed as group-led instruction    Resistance Training Performed  No    VAD Patient?  No    PAD/SET Patient?  No      Pain Assessment   Currently in Pain?  No/denies    Multiple Pain Sites  No       Capillary Blood Glucose: No results found for this or any previous visit (from the past 24 hour(s)).    Social History   Tobacco Use  Smoking Status Former Smoker  . Packs/day: 1.00  . Years: 32.00  . Pack years: 32.00  . Types: Cigarettes  . Last attempt to quit: 2002  . Years since quitting: 17.6  Smokeless Tobacco Never Used    Goals Met:  Exercise tolerated well No report of cardiac concerns or symptoms Strength training completed today  Goals Unmet:  Not Applicable  Comments: Service time is from 1330 to 1510    Dr. Rush Farmer is Medical Director for Pulmonary Rehab at [facility].

## 2018-05-07 ENCOUNTER — Encounter (HOSPITAL_COMMUNITY)
Admission: RE | Admit: 2018-05-07 | Discharge: 2018-05-07 | Disposition: A | Discharge status: Home / Self Care | Payer: 59 | Source: Ambulatory Visit | Attending: Internal Medicine | Admitting: Internal Medicine

## 2018-05-07 ENCOUNTER — Encounter (HOSPITAL_COMMUNITY): Payer: 59

## 2018-05-07 VITALS — Wt 147.5 lb

## 2018-05-07 DIAGNOSIS — Z791 Long term (current) use of non-steroidal anti-inflammatories (NSAID): Secondary | ICD-10-CM | POA: Diagnosis not present

## 2018-05-07 DIAGNOSIS — J438 Other emphysema: Secondary | ICD-10-CM

## 2018-05-07 DIAGNOSIS — Z87891 Personal history of nicotine dependence: Secondary | ICD-10-CM | POA: Diagnosis not present

## 2018-05-07 DIAGNOSIS — Z79899 Other long term (current) drug therapy: Secondary | ICD-10-CM | POA: Diagnosis not present

## 2018-05-07 DIAGNOSIS — I10 Essential (primary) hypertension: Secondary | ICD-10-CM | POA: Diagnosis not present

## 2018-05-07 NOTE — Progress Notes (Signed)
Daily Session Note  Patient Details  Name: Travis Salinas MRN: [id number] Date of Birth: [birth year] Referring Provider:     Pulmonary Rehab Walk Test from 04/07/2018 in [place]  Referring Provider  Dr. Chase Caller      Encounter Date: 05/07/2018  Check In: Session Check In - 05/07/18 1343      Check-In   Supervising physician immediately available to respond to emergencies  [facility] immediately available    Physician(s)  Dr. Florene Glen    Location  [facility]    Staff Present  Maurice Small, RN, BSN;Antony Sian, MS, ACSM RCEP, Exercise Physiologist;Annedrea Stackhouse, RN, [place]    Medication changes reported      No    Fall or balance concerns reported     No    Tobacco Cessation  No Change    Warm-up and Cool-down  Performed as group-led instruction    Resistance Training Performed  Yes    VAD Patient?  No    PAD/SET Patient?  No      Pain Assessment   Currently in Pain?  No/denies    Multiple Pain Sites  No       Capillary Blood Glucose: No results found for this or any previous visit (from the past 24 hour(s)).    Social History   Tobacco Use  Smoking Status Former Smoker  . Packs/day: 1.00  . Years: 32.00  . Pack years: 32.00  . Types: Cigarettes  . Last attempt to quit: 2002  . Years since quitting: 17.6  Smokeless Tobacco Never Used    Goals Met:  Exercise tolerated well  Goals Unmet:  Not Applicable  Comments: Service time is from 1:30p to 3:45p    Dr. Rush Farmer is Medical Director for Pulmonary Rehab at [facility].

## 2018-05-12 ENCOUNTER — Encounter (HOSPITAL_COMMUNITY): Payer: 59

## 2018-05-13 DIAGNOSIS — F1910 Other psychoactive substance abuse, uncomplicated: Secondary | ICD-10-CM | POA: Diagnosis not present

## 2018-05-13 DIAGNOSIS — J449 Chronic obstructive pulmonary disease, unspecified: Secondary | ICD-10-CM | POA: Diagnosis not present

## 2018-05-13 DIAGNOSIS — N529 Male erectile dysfunction, unspecified: Secondary | ICD-10-CM | POA: Diagnosis not present

## 2018-05-13 DIAGNOSIS — F39 Unspecified mood [affective] disorder: Secondary | ICD-10-CM | POA: Diagnosis not present

## 2018-05-13 DIAGNOSIS — I471 Supraventricular tachycardia: Secondary | ICD-10-CM | POA: Diagnosis not present

## 2018-05-14 ENCOUNTER — Encounter (HOSPITAL_COMMUNITY): Payer: 59

## 2018-05-14 ENCOUNTER — Encounter (HOSPITAL_COMMUNITY)
Admission: RE | Admit: 2018-05-14 | Discharge: 2018-05-14 | Disposition: A | Discharge status: Home / Self Care | Payer: 59 | Source: Ambulatory Visit | Attending: Internal Medicine | Admitting: Internal Medicine

## 2018-05-14 DIAGNOSIS — Z87891 Personal history of nicotine dependence: Secondary | ICD-10-CM | POA: Diagnosis not present

## 2018-05-14 DIAGNOSIS — J438 Other emphysema: Secondary | ICD-10-CM | POA: Insufficient documentation

## 2018-05-14 DIAGNOSIS — Z79899 Other long term (current) drug therapy: Secondary | ICD-10-CM | POA: Insufficient documentation

## 2018-05-14 DIAGNOSIS — I10 Essential (primary) hypertension: Secondary | ICD-10-CM | POA: Diagnosis not present

## 2018-05-14 DIAGNOSIS — Z791 Long term (current) use of non-steroidal anti-inflammatories (NSAID): Secondary | ICD-10-CM | POA: Insufficient documentation

## 2018-05-14 NOTE — Progress Notes (Signed)
Daily Session Note  Patient Details  Name: Daaiel Starlin MRN: [id number] Date of Birth: [birth year] Referring Provider:     Pulmonary Rehab Walk Test from 04/07/2018 in [place]  Referring Provider  Dr. Chase Caller      Encounter Date: 05/14/2018  Check In: Session Check In - 05/14/18 1523      Check-In   Supervising physician immediately available to respond to emergencies  [facility] immediately available    Physician(s)  Dr. Lars Mage     Location  [facility]    Staff Present  Rodney Langton, RN;Joann Rion, RN, BSN;Carlette Wilber Oliphant, RN, BSN;Ramon Dredge, RN, MHA    Medication changes reported      No    Fall or balance concerns reported     No    Tobacco Cessation  No Change    Warm-up and Cool-down  Performed as group-led instruction    Resistance Training Performed  Yes    VAD Patient?  No    PAD/SET Patient?  No      Pain Assessment   Currently in Pain?  No/denies       Capillary Blood Glucose: No results found for this or any previous visit (from the past 24 hour(s)).    Social History   Tobacco Use  Smoking Status Former Smoker  . Packs/day: 1.00  . Years: 32.00  . Pack years: 32.00  . Types: Cigarettes  . Last attempt to quit: 2002  . Years since quitting: 17.6  Smokeless Tobacco Never Used    Goals Met:  Exercise tolerated well No report of cardiac concerns or symptoms Strength training completed today  Goals Unmet:  Not Applicable  Comments: Service time is from 1330 to 1505    Dr. Rush Farmer is Medical Director for Pulmonary Rehab at [facility].

## 2018-05-17 DIAGNOSIS — L0390 Cellulitis, unspecified: Secondary | ICD-10-CM | POA: Diagnosis not present

## 2018-05-17 DIAGNOSIS — J449 Chronic obstructive pulmonary disease, unspecified: Secondary | ICD-10-CM | POA: Diagnosis not present

## 2018-05-17 DIAGNOSIS — L0291 Cutaneous abscess, unspecified: Secondary | ICD-10-CM | POA: Diagnosis not present

## 2018-05-17 DIAGNOSIS — R2681 Unsteadiness on feet: Secondary | ICD-10-CM | POA: Diagnosis not present

## 2018-05-17 DIAGNOSIS — I5030 Unspecified diastolic (congestive) heart failure: Secondary | ICD-10-CM | POA: Diagnosis not present

## 2018-05-17 DIAGNOSIS — J189 Pneumonia, unspecified organism: Secondary | ICD-10-CM | POA: Diagnosis not present

## 2018-05-19 ENCOUNTER — Encounter (HOSPITAL_COMMUNITY): Payer: 59

## 2018-05-19 ENCOUNTER — Encounter (HOSPITAL_COMMUNITY)
Admission: RE | Admit: 2018-05-19 | Discharge: 2018-05-19 | Disposition: A | Discharge status: Home / Self Care | Payer: 59 | Source: Ambulatory Visit | Attending: Internal Medicine | Admitting: Internal Medicine

## 2018-05-19 DIAGNOSIS — I10 Essential (primary) hypertension: Secondary | ICD-10-CM | POA: Diagnosis not present

## 2018-05-19 DIAGNOSIS — Z79899 Other long term (current) drug therapy: Secondary | ICD-10-CM | POA: Diagnosis not present

## 2018-05-19 DIAGNOSIS — J438 Other emphysema: Secondary | ICD-10-CM | POA: Diagnosis not present

## 2018-05-19 DIAGNOSIS — Z791 Long term (current) use of non-steroidal anti-inflammatories (NSAID): Secondary | ICD-10-CM | POA: Diagnosis not present

## 2018-05-19 DIAGNOSIS — Z87891 Personal history of nicotine dependence: Secondary | ICD-10-CM | POA: Diagnosis not present

## 2018-05-19 NOTE — Progress Notes (Signed)
Daily Session Note  Patient Details  Name: Armend Hochstatter MRN: [id number] Date of Birth: 1951/07/15 Referring Provider:     Pulmonary Rehab Walk Test from 04/07/2018 in [place]  Referring Provider  Dr. Chase Caller      Encounter Date: 05/19/2018  Check In: Session Check In - 05/19/18 1339      Check-In   Supervising physician immediately available to respond to emergencies  [facility] immediately available    Physician(s)  Dr. Darrick Meigs    Location  [facility]    Staff Present  Rodney Langton, RN;Carlette Wilber Oliphant, RN, BSN;Ramon Dredge, RN, MHA;Molly DiVincenzo, MS, ACSM RCEP, Exercise Physiologist    Medication changes reported      No    Fall or balance concerns reported     No    Tobacco Cessation  No Change    Warm-up and Cool-down  Performed as group-led instruction    Resistance Training Performed  Yes    VAD Patient?  No    PAD/SET Patient?  No      Pain Assessment   Currently in Pain?  No/denies    Multiple Pain Sites  No       Capillary Blood Glucose: No results found for this or any previous visit (from the past 24 hour(s)).    Social History   Tobacco Use  Smoking Status Former Smoker  . Packs/day: 1.00  . Years: 32.00  . Pack years: 32.00  . Types: Cigarettes  . Last attempt to quit: 2002  . Years since quitting: 17.7  Smokeless Tobacco Never Used    Goals Met:  Exercise tolerated well No report of cardiac concerns or symptoms Strength training completed today  Goals Unmet:  Not Applicable  Comments: Service time is from 1330 to 1530    Dr. Rush Farmer is Medical Director for Pulmonary Rehab at [facility].

## 2018-05-21 ENCOUNTER — Encounter (HOSPITAL_COMMUNITY): Payer: 59

## 2018-05-21 ENCOUNTER — Encounter (HOSPITAL_COMMUNITY)
Admission: RE | Admit: 2018-05-21 | Discharge: 2018-05-21 | Disposition: A | Discharge status: Home / Self Care | Payer: 59 | Source: Ambulatory Visit | Attending: Internal Medicine | Admitting: Internal Medicine

## 2018-05-21 DIAGNOSIS — Z87891 Personal history of nicotine dependence: Secondary | ICD-10-CM | POA: Diagnosis not present

## 2018-05-21 DIAGNOSIS — Z791 Long term (current) use of non-steroidal anti-inflammatories (NSAID): Secondary | ICD-10-CM | POA: Diagnosis not present

## 2018-05-21 DIAGNOSIS — Z79899 Other long term (current) drug therapy: Secondary | ICD-10-CM | POA: Diagnosis not present

## 2018-05-21 DIAGNOSIS — I10 Essential (primary) hypertension: Secondary | ICD-10-CM | POA: Diagnosis not present

## 2018-05-21 DIAGNOSIS — J438 Other emphysema: Secondary | ICD-10-CM | POA: Diagnosis not present

## 2018-05-21 NOTE — Progress Notes (Signed)
Daily Session Note  Patient Details  Name: Wilkin Lippy MRN: [id number] Date of Birth: November 16, 1950 Referring Provider:     Pulmonary Rehab Walk Test from 04/07/2018 in [place]  Referring Provider  Dr. Chase Caller      Encounter Date: 05/21/2018  Check In: Session Check In - 05/21/18 1505      Check-In   Supervising physician immediately available to respond to emergencies  [facility] immediately available   Simultaneous filing. User may not have seen previous data.   Physician(s)  Dr. Dyann Kief    Location  [facility]   Simultaneous filing. User may not have seen previous data.   Staff Present  Maurice Small, RN, BSN;Molly DiVincenzo, MS, ACSM RCEP, Exercise Physiologist;Rayden Scheper Ysidro Evert, Felipe Drone, RN, MHA    Medication changes reported      No    Fall or balance concerns reported     No    Tobacco Cessation  No Change    Warm-up and Cool-down  Performed as group-led instruction    Resistance Training Performed  Yes    VAD Patient?  No    PAD/SET Patient?  No      Pain Assessment   Currently in Pain?  No/denies    Pain Score  0-No pain    Multiple Pain Sites  No       Capillary Blood Glucose: No results found for this or any previous visit (from the past 24 hour(s)).    Social History   Tobacco Use  Smoking Status Former Smoker  . Packs/day: 1.00  . Years: 32.00  . Pack years: 32.00  . Types: Cigarettes  . Last attempt to quit: 2002  . Years since quitting: 17.7  Smokeless Tobacco Never Used    Goals Met:  Exercise tolerated well No report of cardiac concerns or symptoms Strength training completed today  Goals Unmet:  Not Applicable  Comments: Service time is from 1330 to 1500    Dr. Rush Farmer is Medical Director for Pulmonary Rehab at [facility].

## 2018-05-26 ENCOUNTER — Telehealth (HOSPITAL_COMMUNITY): Payer: Self-pay | Admitting: Family Medicine

## 2018-05-26 ENCOUNTER — Encounter (HOSPITAL_COMMUNITY)
Admission: RE | Admit: 2018-05-26 | Discharge: 2018-05-26 | Disposition: A | Discharge status: Home / Self Care | Payer: 59 | Source: Ambulatory Visit

## 2018-05-26 ENCOUNTER — Encounter (HOSPITAL_COMMUNITY)
Admission: RE | Admit: 2018-05-26 | Discharge: 2018-05-26 | Disposition: A | Discharge status: Home / Self Care | Payer: 59 | Source: Ambulatory Visit | Attending: Internal Medicine | Admitting: Internal Medicine

## 2018-05-26 ENCOUNTER — Encounter (HOSPITAL_COMMUNITY): Payer: 59

## 2018-05-26 VITALS — Wt 145.5 lb

## 2018-05-26 DIAGNOSIS — I10 Essential (primary) hypertension: Secondary | ICD-10-CM | POA: Diagnosis not present

## 2018-05-26 DIAGNOSIS — Z87891 Personal history of nicotine dependence: Secondary | ICD-10-CM | POA: Diagnosis not present

## 2018-05-26 DIAGNOSIS — Z79899 Other long term (current) drug therapy: Secondary | ICD-10-CM | POA: Diagnosis not present

## 2018-05-26 DIAGNOSIS — J438 Other emphysema: Secondary | ICD-10-CM

## 2018-05-26 DIAGNOSIS — Z791 Long term (current) use of non-steroidal anti-inflammatories (NSAID): Secondary | ICD-10-CM | POA: Diagnosis not present

## 2018-05-26 NOTE — Progress Notes (Signed)
Daily Session Note  Patient Details  Name: Travis Salinas MRN: [id number] Date of Birth: 1951/01/25 Referring Provider:     Pulmonary Rehab Walk Test from 04/07/2018 in [place]  Referring Provider  Dr. Chase Caller      Encounter Date: 05/26/2018  Check In: Session Check In - 05/26/18 1523      Check-In   Supervising physician immediately available to respond to emergencies  [facility] immediately available    Physician(s)  Dr. Sloan Leiter    Location  [facility]    Staff Present  Maurice Small, RN, BSN;Molly DiVincenzo, MS, ACSM RCEP, Exercise Physiologist;Mackenze Grandison Ysidro Evert, RN    Medication changes reported      No    Fall or balance concerns reported     No    Tobacco Cessation  No Change    Warm-up and Cool-down  Performed as group-led instruction    Resistance Training Performed  Yes    VAD Patient?  No    PAD/SET Patient?  No      Pain Assessment   Currently in Pain?  No/denies    Pain Score  0-No pain    Multiple Pain Sites  No       Capillary Blood Glucose: No results found for this or any previous visit (from the past 24 hour(s)).  Exercise Prescription Changes - 05/26/18 1600      Response to Exercise   Blood Pressure (Admit)  128/68    Blood Pressure (Exercise)  110/70    Blood Pressure (Exit)  120/78    Heart Rate (Admit)  87 bpm    Heart Rate (Exercise)  103 bpm    Heart Rate (Exit)  93 bpm    Oxygen Saturation (Admit)  98 %    Oxygen Saturation (Exercise)  96 %    Oxygen Saturation (Exit)  97 %    Rating of Perceived Exertion (Exercise)  13    Perceived Dyspnea (Exercise)  2    Duration  Progress to 45 minutes of aerobic exercise without signs/symptoms of physical distress    Intensity  THRR unchanged      Progression   Progression  Continue to progress workloads to maintain intensity without signs/symptoms of physical distress.      Resistance Training   Training Prescription  Yes    Weight   blue bands    Reps  10-15    Time  10 Minutes      NuStep   Level  5    SPM  80    Minutes  17    METs  2.2      Track   Laps  32    Minutes  34       Social History   Tobacco Use  Smoking Status Former Smoker  . Packs/day: 1.00  . Years: 32.00  . Pack years: 32.00  . Types: Cigarettes  . Last attempt to quit: 2002  . Years since quitting: 17.7  Smokeless Tobacco Never Used    Goals Met:  Exercise tolerated well No report of cardiac concerns or symptoms Strength training completed today  Goals Unmet:  Not Applicable  Comments: Service time is from 1330 to 1515    Dr. Rush Farmer is Medical Director for Pulmonary Rehab at [facility]

## 2018-05-27 NOTE — Progress Notes (Signed)
Pulmonary Individual Treatment Plan  Patient Details  Name: Travis Salinas MRN: [id number] Date of Birth: 1951/01/20 Referring Provider:     Pulmonary Rehab Walk Test from 04/07/2018 in [facility]  Referring Provider  Dr. Chase Caller      Initial Encounter Date:    Pulmonary Rehab Walk Test from 04/07/2018 in [facility]  Date  04/07/18      Visit Diagnosis: Other emphysema ([place])  Patient's Home Medications on Admission:   Current Outpatient Medications:  .  acetaminophen (TYLENOL) 325 MG tablet, Take 2 tablets (650 mg total) by mouth every 6 (six) hours as needed for mild pain (or Fever >/= 101)., Disp: , Rfl:  .  albuterol (PROVENTIL) (2.5 MG/3ML) 0.083% nebulizer solution, Take 3 mLs (2.5 mg total) by nebulization every 2 (two) hours as needed for wheezing or shortness of breath., Disp: 75 mL, Rfl: 12 .  ARIPiprazole (ABILIFY) 15 MG tablet, 10 mg. 7/29 dosage decreased to 10 mg, Disp: , Rfl: 3 .  atenolol (TENORMIN) 25 MG tablet, Take 1 tablet (25 mg total) by mouth every morning., Disp: , Rfl:  .  clonazePAM (KLONOPIN) 0.5 MG tablet, Take 1 tablet (0.5 mg total) by mouth 2 (two) times daily. (Patient taking differently: Take 1 mg by mouth 2 (two) times daily. ), Disp: 30 tablet, Rfl: 0 .  feeding supplement, ENSURE ENLIVE, (ENSURE ENLIVE) LIQD, Take 237 mLs by mouth 2 (two) times daily between meals. (Patient not taking: Reported on 04/06/2018), Disp: 237 mL, Rfl: 12 .  FLUoxetine (PROZAC) 20 MG capsule, Take 1 capsule (20 mg total) by mouth daily. (Patient taking differently: Take 10 mg by mouth daily. ), Disp: , Rfl: 3 .  fluticasone (FLONASE) 50 MCG/ACT nasal spray, Place 1 spray into both nostrils every morning., Disp: , Rfl: 0 .  ibuprofen (ADVIL,MOTRIN) 600 MG tablet, Take 1 tablet (600 mg total) by mouth 4 (four) times daily., Disp: 30 tablet, Rfl: 0 .  ipratropium-albuterol (DUONEB) 0.5-2.5 (3) MG/3ML SOLN, Take 3  mLs by nebulization 3 (three) times daily. (Patient taking differently: Take 3 mLs by nebulization 3 (three) times daily as needed. ), Disp: 360 mL, Rfl:  .  lamoTRIgine (LAMICTAL) 200 MG tablet, Take 1 tablet (200 mg total) by mouth every morning., Disp: , Rfl:  .  silodosin (RAPAFLO) 8 MG CAPS capsule, Take 8 mg daily with breakfast by mouth., Disp: , Rfl:  .  Tiotropium Bromide Monohydrate (SPIRIVA RESPIMAT) 2.5 MCG/ACT AERS, Inhale 2 puffs into the lungs daily., Disp: 1 Inhaler, Rfl: 5  Past Medical History: Past Medical History:  Diagnosis Date  . Arthritis   . Cervical disc herniation   . COPD (chronic obstructive pulmonary disease) ([place])   . Headache    " due to antibiotics"  . Hepatitis C   . Hypertension     Tobacco Use: Social History   Tobacco Use  Smoking Status Former Smoker  . Packs/day: 1.00  . Years: 32.00  . Pack years: 32.00  . Types: Cigarettes  . Last attempt to quit: 2002  . Years since quitting: 17.7  Smokeless Tobacco Never Used    Labs: Recent Chemical engineer    Labs for ITP Cardiac and Pulmonary Rehab Latest Ref Rng & Units 05/27/2017 05/27/2017 05/28/2017 05/31/2017 06/14/2017   Hemoglobin A1c 4.8 - 5.6 % - - - - 5.9(H)   PHART 7.350 - 7.450 7.461(H) 7.442 7.408 7.467(H) -   PCO2ART 32.0 - 48.0  mmHg 28.2(L) 29.2(L) 36.4 35.3 -   HCO3 20.0 - 28.0 mmol/L 19.8(L) 19.6(L) 22.6 25.1 -   TCO2 0 - 100 mmol/L - - - - -   ACIDBASEDEF 0.0 - 2.0 mmol/L 2.6(H) 3.2(H) 1.3 - -   O2SAT % 89.1 82.5 89.1 98.7 -      Capillary Blood Glucose: No results found for: GLUCAP   Pulmonary Assessment Scores: Pulmonary Assessment Scores    Row Name 04/07/18 1300 04/15/18 1045       ADL UCSD   ADL Phase  Entry  Entry    SOB Score total  -  63      CAT Score   CAT Score  -  11      mMRC Score   mMRC Score  0  -       Pulmonary Function Assessment:   Exercise Target Goals: Exercise Program Goal: Individual exercise prescription set using results from  initial 6 min walk test and THRR while considering  patient's activity barriers and safety.   Exercise Prescription Goal: Initial exercise prescription builds to 30-45 minutes a day of aerobic activity, 2-3 days per week.  Home exercise guidelines will be given to patient during program as part of exercise prescription that the participant will acknowledge.  Activity Barriers & Risk Stratification: Activity Barriers & Cardiac Risk Stratification - 04/06/18 1146      Activity Barriers & Cardiac Risk Stratification   Activity Barriers  Joint Problems;Deconditioning;History of Falls;Neck/Spine Problems;Shortness of Breath    Cardiac Risk Stratification  High       6 Minute Walk: 6 Minute Walk    Row Name 04/07/18 1255         6 Minute Walk   Phase  Discharge     Distance  1310 feet     Walk Time  6 minutes     # of Rest Breaks  0     MPH  2.48     METS  2.91     RPE  11     Perceived Dyspnea   1     Symptoms  No     Resting HR  75 bpm     Resting BP  114/60     Resting Oxygen Saturation   97 %     Exercise Oxygen Saturation  during 6 min walk  93 %     Max Ex. HR  100 bpm     Max Ex. BP  130/60       Interval HR   1 Minute HR  90     2 Minute HR  100     3 Minute HR  97     4 Minute HR  100     5 Minute HR  96     6 Minute HR  101     2 Minute Post HR  88     Interval Heart Rate?  Yes       Interval Oxygen   Interval Oxygen?  Yes     Baseline Oxygen Saturation %  97 %     1 Minute Oxygen Saturation %  96 %     1 Minute Liters of Oxygen  0 L     2 Minute Oxygen Saturation %  93 %     2 Minute Liters of Oxygen  0 L     3 Minute Oxygen Saturation %  94 %     3 Minute Liters of Oxygen  0 L     4 Minute Oxygen Saturation %  94 %     4 Minute Liters of Oxygen  0 L     5 Minute Oxygen Saturation %  95 %     5 Minute Liters of Oxygen  0 L     6 Minute Oxygen Saturation %  95 %     6 Minute Liters of Oxygen  0 L     2 Minute Post Oxygen Saturation %  94 %     2  Minute Post Liters of Oxygen  0 L        Oxygen Initial Assessment: Oxygen Initial Assessment - 04/07/18 1255      Initial 6 min Walk   Oxygen Used  None      Program Oxygen Prescription   Program Oxygen Prescription  None       Oxygen Re-Evaluation: Oxygen Re-Evaluation    Row Name 04/28/18 0734 05/26/18 0938           Program Oxygen Prescription   Program Oxygen Prescription  None  None        Home Oxygen   Home Oxygen Device  Portable Concentrator  Portable Concentrator      Sleep Oxygen Prescription  Continuous  Continuous      Liters per minute  2  2      Home Exercise Oxygen Prescription  None  None      Home at Rest Exercise Oxygen Prescription  None  None      Compliance with Home Oxygen Use  Yes  Yes        Goals/Expected Outcomes   Short Term Goals  -  To learn and exhibit compliance with exercise, home and travel O2 prescription;To learn and understand importance of monitoring SPO2 with pulse oximeter and demonstrate accurate use of the pulse oximeter.;To learn and understand importance of maintaining oxygen saturations>88%;To learn and demonstrate proper pursed lip breathing techniques or other breathing techniques.;To learn and demonstrate proper use of respiratory medications      Long  Term Goals  -  Exhibits compliance with exercise, home and travel O2 prescription;Verbalizes importance of monitoring SPO2 with pulse oximeter and return demonstration;Maintenance of O2 saturations>88%;Exhibits proper breathing techniques, such as pursed lip breathing or other method taught during program session;Compliance with respiratory medication;Demonstrates proper use of MDI's      Goals/Expected Outcomes  Patient to maintain compliance with night o2  Patient to maintain compliance with night o2         Oxygen Discharge (Final Oxygen Re-Evaluation): Oxygen Re-Evaluation - 05/26/18 0938      Program Oxygen Prescription   Program Oxygen Prescription  None      Home  Oxygen   Home Oxygen Device  Portable Concentrator    Sleep Oxygen Prescription  Continuous    Liters per minute  2    Home Exercise Oxygen Prescription  None    Home at Rest Exercise Oxygen Prescription  None    Compliance with Home Oxygen Use  Yes      Goals/Expected Outcomes   Short Term Goals  To learn and exhibit compliance with exercise, home and travel O2 prescription;To learn and understand importance of monitoring SPO2 with pulse oximeter and demonstrate accurate use of the pulse oximeter.;To learn and understand importance of maintaining oxygen saturations>88%;To learn and demonstrate proper pursed lip breathing techniques or other breathing techniques.;To learn and demonstrate proper use of respiratory medications    Long  Term  Goals  Exhibits compliance with exercise, home and travel O2 prescription;Verbalizes importance of monitoring SPO2 with pulse oximeter and return demonstration;Maintenance of O2 saturations>88%;Exhibits proper breathing techniques, such as pursed lip breathing or other method taught during program session;Compliance with respiratory medication;Demonstrates proper use of MDI's    Goals/Expected Outcomes  Patient to maintain compliance with night o2       Initial Exercise Prescription: Initial Exercise Prescription - 04/07/18 1300      Date of Initial Exercise RX and Referring Provider   Date  04/07/18    Referring Provider  Dr. Chase Caller      Bike   Level  0.5    Minutes  17      NuStep   Level  4    SPM  80    Minutes  17    METs  2      Track   Laps  15    Minutes  17      Prescription Details   Frequency (times per week)  2    Duration  Progress to 45 minutes of aerobic exercise without signs/symptoms of physical distress      Intensity   THRR 40-80% of Max Heartrate  62-123    Ratings of Perceived Exertion  11-13    Perceived Dyspnea  0-4      Progression   Progression  Continue progressive overload as per policy without  signs/symptoms or physical distress.      Resistance Training   Training Prescription  Yes    Weight  blue bands    Reps  10-15       Perform Capillary Blood Glucose checks as needed.  Exercise Prescription Changes: Exercise Prescription Changes    Row Name 04/14/18 1500 04/28/18 1600 05/07/18 0847 05/26/18 1600       Response to Exercise   Blood Pressure (Admit)  120/60  128/78  132/72  128/68    Blood Pressure (Exercise)  132/72  138/82  130/76  110/70    Blood Pressure (Exit)  122/70  122/72  130/76  120/78    Heart Rate (Admit)  68 bpm  68 bpm  60 bpm  87 bpm    Heart Rate (Exercise)  80 bpm  75 bpm  84 bpm  103 bpm    Heart Rate (Exit)  58 bpm  66 bpm  84 bpm  93 bpm    Oxygen Saturation (Admit)  97 %  98 %  97 %  98 %    Oxygen Saturation (Exercise)  95 %  97 %  95 %  96 %    Oxygen Saturation (Exit)  97 %  97 %  95 %  97 %    Rating of Perceived Exertion (Exercise)  13  11  7  13     Perceived Dyspnea (Exercise)  3  1  1  2     Duration  Progress to 45 minutes of aerobic exercise without signs/symptoms of physical distress  Progress to 45 minutes of aerobic exercise without signs/symptoms of physical distress  Progress to 45 minutes of aerobic exercise without signs/symptoms of physical distress  Progress to 45 minutes of aerobic exercise without signs/symptoms of physical distress    Intensity  Other (comment)  THRR unchanged  THRR unchanged  THRR unchanged      Progression   Progression  Continue to progress workloads to maintain intensity without signs/symptoms of physical distress.  Continue to progress workloads to maintain intensity without signs/symptoms of  physical distress.  Continue to progress workloads to maintain intensity without signs/symptoms of physical distress.  Continue to progress workloads to maintain intensity without signs/symptoms of physical distress.      Resistance Training   Training Prescription  Yes  Yes  Yes  Yes    Weight  blue bands  blue  bands  blue bands  blue bands    Reps  10-15  10-15  10-15  10-15    Time  10 Minutes  10 Minutes  10 Minutes  10 Minutes      Bike   Level  0.5  3  -  -    Minutes  17  17  -  -      NuStep   Level  -  -  -  5    SPM  -  -  -  80    Minutes  -  -  -  17    METs  -  -  -  2.2      Track   Laps  26  27  26   32    Minutes  17  17  34  34       Exercise Comments:   Exercise Goals and Review:   Exercise Goals Re-Evaluation : Exercise Goals Re-Evaluation    Row Name 04/06/18 1038 04/28/18 0734 05/26/18 0938         Exercise Goal Re-Evaluation   Exercise Goals Review  Increase Physical Activity;Able to understand and use Dyspnea scale;Understanding of Exercise Prescription;Increase Strength and Stamina;Knowledge and understanding of Target Heart Rate Range (THRR);Able to understand and use rate of perceived exertion (RPE) scale  Increase Physical Activity;Able to understand and use Dyspnea scale;Understanding of Exercise Prescription;Increase Strength and Stamina;Knowledge and understanding of Target Heart Rate Range (THRR);Able to understand and use rate of perceived exertion (RPE) scale  Increase Physical Activity;Able to understand and use Dyspnea scale;Understanding of Exercise Prescription;Increase Strength and Stamina;Knowledge and understanding of Target Heart Rate Range (THRR);Able to understand and use rate of perceived exertion (RPE) scale     Comments  -  Patient has only attended 2 rehab sessions. Attendance has been consistent due to not always having transportation. Will cont. to monitor and motivate as able.  Patient and I have discussed how to increase his workloads. Patient is interested in doing the weights, however, I think he needs to focus on his cardiovascular health at this point in time. Patient stated understanding. Will cont to monitor and motivate. Will dicuss importance of home exercise.      Expected Outcomes   Through exercise at rehab and at home, patient  will increase strength and stamina. Patient will also gain the confidence and knowledge to adhere to a exercise regime at home.   Through exercise at rehab and at home, patient will increase strength and stamina. Patient will also gain the confidence and knowledge to adhere to a exercise regime at home.   Through exercise at rehab and at home, patient will increase strength and stamina. Patient will also gain the confidence and knowledge to adhere to a exercise regime at home.        Discharge Exercise Prescription (Final Exercise Prescription Changes): Exercise Prescription Changes - 05/26/18 1600      Response to Exercise   Blood Pressure (Admit)  128/68    Blood Pressure (Exercise)  110/70    Blood Pressure (Exit)  120/78    Heart Rate (Admit)  87 bpm  Heart Rate (Exercise)  103 bpm    Heart Rate (Exit)  93 bpm    Oxygen Saturation (Admit)  98 %    Oxygen Saturation (Exercise)  96 %    Oxygen Saturation (Exit)  97 %    Rating of Perceived Exertion (Exercise)  13    Perceived Dyspnea (Exercise)  2    Duration  Progress to 45 minutes of aerobic exercise without signs/symptoms of physical distress    Intensity  THRR unchanged      Progression   Progression  Continue to progress workloads to maintain intensity without signs/symptoms of physical distress.      Resistance Training   Training Prescription  Yes    Weight  blue bands    Reps  10-15    Time  10 Minutes      NuStep   Level  5    SPM  80    Minutes  17    METs  2.2      Track   Laps  32    Minutes  34       Nutrition:  Target Goals: Understanding of nutrition guidelines, daily intake of sodium 1500mg , cholesterol 200mg , calories 30% from fat and 7% or less from saturated fats, daily to have 5 or more servings of fruits and vegetables.  Biometrics:    Nutrition Therapy Plan and Nutrition Goals: Nutrition Therapy & Goals - 04/06/18 1340      Nutrition Therapy   Diet  Low Sodium       Personal  Nutrition Goals   Nutrition Goal  pt to identify and limit food sources of sodium    Personal Goal #2  The pt will consume high-energy, high-nutrient dense beverages when necessary to compensate for decreased oral intake of solid foods.      Intervention Plan   Intervention  Prescribe, educate and counsel regarding individualized specific dietary modifications aiming towards targeted core components such as weight, hypertension, lipid management, diabetes, heart failure and other comorbidities.    Expected Outcomes  Short Term Goal: Understand basic principles of dietary content, such as calories, fat, sodium, cholesterol and nutrients.;Long Term Goal: Adherence to prescribed nutrition plan.       Nutrition Assessments: Nutrition Assessments - 04/06/18 1358      Rate Your Plate Scores   Pre Score  57       Nutrition Goals Re-Evaluation: Nutrition Goals Re-Evaluation    [place] Name 04/16/18 1503             Goals   Nutrition Goal  pt to identify and limit food sources of sodium       Comment  Identify food quantities necessary to achieve wt maintenance/ gain of  -2# per week to a goal wt loss of 6-15 lb at graduation from pulmonary rehab.         Personal Goal #2 Re-Evaluation   Personal Goal #2  The pt will consume high-energy, high-nutrient dense beverages when necessary to compensate for decreased oral intake of solid foods.          Nutrition Goals Discharge (Final Nutrition Goals Re-Evaluation): Nutrition Goals Re-Evaluation - 04/16/18 1503      Goals   Nutrition Goal  pt to identify and limit food sources of sodium    Comment  Identify food quantities necessary to achieve wt maintenance/ gain of  -2# per week to a goal wt loss of 6-15 lb at graduation from pulmonary rehab.  Personal Goal #2 Re-Evaluation   Personal Goal #2  The pt will consume high-energy, high-nutrient dense beverages when necessary to compensate for decreased oral intake of solid foods.        Psychosocial: Target Goals: Acknowledge presence or absence of significant depression and/or stress, maximize coping skills, provide positive support system. Participant is able to verbalize types and ability to use techniques and skills needed for reducing stress and depression.  Initial Review & Psychosocial Screening: Initial Psych Review & Screening - 04/06/18 1142      Initial Review   Comments  Pt complains of feeling bored.  Pt would like to do more but his shortness of breath keeps him from do much physical activity.  As a result he sits around the house thinking about all the things he can not do anymore and this makes him feel stressed.  Pt is on prozac, klonipin, abilify       Quality of Life Scores:  Scores of 19 and below usually indicate a poorer quality of life in these areas.  A difference of  2-3 points is a clinically meaningful difference.  A difference of 2-3 points in the total score of the Quality of Life Index has been associated with significant improvement in overall quality of life, self-image, physical symptoms, and general health in studies assessing change in quality of life.  PHQ-9: Recent Review Flowsheet Data    Depression screen [facility] 2/9 04/06/2018 10/27/2017 11/25/2016 09/19/2016 05/15/2016   Decreased Interest 1 1 1 1 1    Down, Depressed, Hopeless 2 2 1 1 1    PHQ - 2 Score 3 3 2 2 2    Altered sleeping 1 0 2 0 3   Tired, decreased energy 3 2 1 1 2    Change in appetite 0 0 2 0 2   Feeling bad or failure about yourself  1 - 1 0 1   Trouble concentrating 0 0 0 0 2   Moving slowly or fidgety/restless 1 0 1 1 2    Suicidal thoughts 0 0 0 0 0   PHQ-9 Score 9 5 9 4 14    Difficult doing work/chores Somewhat difficult Not difficult at all Somewhat difficult Somewhat difficult Very difficult     Interpretation of Total Score  Total Score Depression Severity:  1-4 = Minimal depression, 5-9 = Mild depression, 10-14 = Moderate depression, 15-19 = Moderately severe  depression, 20-27 = Severe depression   Psychosocial Evaluation and Intervention: Psychosocial Evaluation - 05/27/18 1701      Psychosocial Evaluation & Interventions   Interventions  Encouraged to exercise with the program and follow exercise prescription    Comments  Pt is hopeful that by participating in pulmonary rehab he will get out of the house more. Pt interacts with staff but very little with fellow participants.  Difficult to fully assess pt needs.    Expected Outcomes  patient will remain free from psychosocial barriers to participation in pulmonary rehab    Continue Psychosocial Services   Follow up required by staff       Psychosocial Re-Evaluation: Psychosocial Re-Evaluation    [place] Name 04/29/18 1051 04/29/18 1052 05/27/18 1702         Psychosocial Re-Evaluation   Current issues with  Current Depression;History of Depression;Current Psychotropic Meds  Current Depression;Current Sleep Concerns;Current Stress Concerns  Current Depression;Current Sleep Concerns;Current Stress Concerns     Comments  -  Pt feels he has depression because he is unable to drive to get out and  do for himself.  He must depend on others to get him where he needs to go.  Pt is hopeful that participating in pulmonary rehab will get him out of the house more.  Engages with fellow classmates at times.  Pt feels he has depression because he is unable to drive to get out and do for himself.  He must depend on others to get him where he needs to go.  Pt is hopeful that participating in pulmonary rehab will get him out of the house more.  Pt arrives late and leaves early.  Pt with general malaise dalily.     Expected Outcomes  patient will remain free from psychosocial barriers to participation in pulmonary rehab  patient will remain free from psychosocial barriers to participation in pulmonary rehab  -     Interventions  Encouraged to attend Pulmonary Rehabilitation for the exercise;Relaxation education;Stress  management education  Encouraged to attend Pulmonary Rehabilitation for the exercise;Relaxation education;Stress management education  -     Continue Psychosocial Services   Follow up required by staff  Follow up required by staff  Follow up required by staff        Psychosocial Discharge (Final Psychosocial Re-Evaluation): Psychosocial Re-Evaluation - 05/27/18 1702      Psychosocial Re-Evaluation   Current issues with  Current Depression;Current Sleep Concerns;Current Stress Concerns    Comments  Pt feels he has depression because he is unable to drive to get out and do for himself.  He must depend on others to get him where he needs to go.  Pt is hopeful that participating in pulmonary rehab will get him out of the house more.  Pt arrives late and leaves early.  Pt with general malaise dalily.    Continue Psychosocial Services   Follow up required by staff       Education: Education Goals: Education classes will be provided on a weekly basis, covering required topics. Participant will state understanding/return demonstration of topics presented.  Learning Barriers/Preferences: Learning Barriers/Preferences - 04/06/18 1040      Learning Barriers/Preferences   Learning Barriers  Sight       Education Topics: Risk Factor Reduction:  -Group instruction that is supported by a PowerPoint presentation. Instructor discusses the definition of a risk factor, different risk factors for pulmonary disease, and how the heart and lungs work together.     Nutrition for Pulmonary Patient:  -Group instruction provided by PowerPoint slides, verbal discussion, and written materials to support subject matter. The instructor gives an explanation and review of healthy diet recommendations, which includes a discussion on weight management, recommendations for fruit and vegetable consumption, as well as protein, fluid, caffeine, fiber, sodium, sugar, and alcohol. Tips for eating when patients are short of  breath are discussed.   PULMONARY REHAB OTHER RESPIRATORY from 05/19/2018 in [place]  Date  05/07/18  Educator  Rodman Pickle  Instruction Review Code  2- Demonstrated Understanding      Pursed Lip Breathing:  -Group instruction that is supported by demonstration and informational handouts. Instructor discusses the benefits of pursed lip and diaphragmatic breathing and detailed demonstration on how to preform both.     Oxygen Safety:  -Group instruction provided by PowerPoint, verbal discussion, and written material to support subject matter. There is an overview of "What is Oxygen" and "Why do we need it".  Instructor also reviews how to create a safe environment for oxygen use, the importance of using oxygen as prescribed, and  the risks of noncompliance. There is a brief discussion on traveling with oxygen and resources the patient may utilize.   PULMONARY REHAB OTHER RESPIRATORY from 01/01/2018 in [place]  Date  12/25/17  Educator  portia  Instruction Review Code  2- Demonstrated Understanding      Oxygen Equipment:  -Group instruction provided by [facility] - [facility] Staff utilizing handouts, written materials, and equipment demonstrations.   Signs and Symptoms:  -Group instruction provided by written material and verbal discussion to support subject matter. Warning signs and symptoms of infection, stroke, and heart attack are reviewed and when to call the physician/911 reinforced. Tips for preventing the spread of infection discussed.   Advanced Directives:  -Group instruction provided by verbal instruction and written material to support subject matter. Instructor reviews Advanced Directive laws and proper instruction for filling out document.   Pulmonary Video:  -Group video education that reviews the importance of medication and oxygen compliance, exercise, good nutrition, pulmonary hygiene, and pursed lip and diaphragmatic  breathing for the pulmonary patient.   Exercise for the Pulmonary Patient:  -Group instruction that is supported by a PowerPoint presentation. Instructor discusses benefits of exercise, core components of exercise, frequency, duration, and intensity of an exercise routine, importance of utilizing pulse oximetry during exercise, safety while exercising, and options of places to exercise outside of rehab.     Pulmonary Medications:  -Verbally interactive group education provided by instructor with focus on inhaled medications and proper administration.   PULMONARY REHAB OTHER RESPIRATORY from 05/19/2018 in [place]  Date  05/19/18  Educator  pharm  Instruction Review Code  2- Demonstrated Understanding      Anatomy and Physiology of the Respiratory System and Intimacy:  -Group instruction provided by PowerPoint, verbal discussion, and written material to support subject matter. Instructor reviews respiratory cycle and anatomical components of the respiratory system and their functions. Instructor also reviews differences in obstructive and restrictive respiratory diseases with examples of each. Intimacy, Sex, and Sexuality differences are reviewed with a discussion on how relationships can change when diagnosed with pulmonary disease. Common sexual concerns are reviewed.   PULMONARY REHAB OTHER RESPIRATORY from 05/19/2018 in [place]  Date  04/30/18  Educator  RN  Instruction Review Code  2- Demonstrated Understanding      MD DAY -A group question and answer session with a medical doctor that allows participants to ask questions that relate to their pulmonary disease state.   PULMONARY REHAB OTHER RESPIRATORY from 05/19/2018 in [place]  Date  04/14/18  Educator  Dr. Nelda Marseille  Instruction Review Code  1- Verbalizes Understanding      OTHER EDUCATION -Group or individual verbal, written,  or video instructions that support the educational goals of the pulmonary rehab program.   PULMONARY REHAB OTHER RESPIRATORY from 01/01/2018 in [place]  Date  01/01/18  Educator  Lucianne Lei  Instruction Review Code  2- Demonstrated Understanding      Holiday Eating Survival Tips:  -Group instruction provided by PowerPoint slides, verbal discussion, and written materials to support subject matter. The instructor gives patients tips, tricks, and techniques to help them not only survive but enjoy the holidays despite the onslaught of food that accompanies the holidays.   Knowledge Questionnaire Score: Knowledge Questionnaire Score - 04/15/18 1045      Knowledge Questionnaire Score   Pre Score  17/18  Core Components/Risk Factors/Patient Goals at Admission: Personal Goals and Risk Factors at Admission - 04/06/18 1140      Core Components/Risk Factors/Patient Goals on Admission   Expected Outcomes  --   Pt feels stressed about his inability to do  activities that he finds pleasurable      Core Components/Risk Factors/Patient Goals Review:  Goals and Risk Factor Review    Row Name 04/29/18 1034 04/29/18 1035 05/27/18 1703         Core Components/Risk Factors/Patient Goals Review   Personal Goals Review  Weight Management/Obesity;Improve shortness of breath with ADL's;Increase knowledge of respiratory medications and ability to use respiratory devices properly.;Develop more efficient breathing techniques such as purse lipped breathing and diaphragmatic breathing and practicing self-pacing with activity.  Weight Management/Obesity;Improve shortness of breath with ADL's;Increase knowledge of respiratory medications and ability to use respiratory devices properly.;Develop more efficient breathing techniques such as purse lipped breathing and diaphragmatic breathing and practicing self-pacing with activity.;Stress  Weight Management/Obesity;Improve  shortness of breath with ADL's;Increase knowledge of respiratory medications and ability to use respiratory devices properly.;Develop more efficient breathing techniques such as purse lipped breathing and diaphragmatic breathing and practicing self-pacing with activity.;Stress     Review  -  Pt has completed 3 exercise sessions since 8/6.  Pt returns to pulmonary rehab.  Pt with fair attendance and often has to miss due to transportation.  Pt depends upon his wife who is also still working for rides to rehab.  Pt aware on proper breathing techniques, needs some reminders from time to time this is stressful for pt.  Pt workloads show slight increase from first day.  airdyne increaed to level 1.2, nustep level 4 and laps around the track 26-27 laps.  I anticipate pt will show measurable progress toward pulmonary rehab goals in the the next 30 day assessment  Pt has completed 10 exercise sessions since 8/6.  Pt returns to pulmonary rehab.  Pt with fair attendance and often has to miss due to transportation.  Pt depends upon his wife who is also still working for rides to rehab.  Pt aware on proper breathing techniques, needs some reminders from time to time this is stressful for pt.  Pt enjoys walking when his hips are not a bother to him.  Pt will sometimes ambulate the entire exercise time. Pt with increase of the nustep to level 5 I anticipate pt will show measurable progress toward pulmonary rehab goals in the the next 30 day assessment     Expected Outcomes  -  See Admission Goals/Outcomes  See Admission Goals/Outcomes        Core Components/Risk Factors/Patient Goals at Discharge (Final Review):  Goals and Risk Factor Review - 05/27/18 1703      Core Components/Risk Factors/Patient Goals Review   Personal Goals Review  Weight Management/Obesity;Improve shortness of breath with ADL's;Increase knowledge of respiratory medications and ability to use respiratory devices properly.;Develop more efficient  breathing techniques such as purse lipped breathing and diaphragmatic breathing and practicing self-pacing with activity.;Stress    Review  Pt has completed 10 exercise sessions since 8/6.  Pt returns to pulmonary rehab.  Pt with fair attendance and often has to miss due to transportation.  Pt depends upon his wife who is also still working for rides to rehab.  Pt aware on proper breathing techniques, needs some reminders from time to time this is stressful for pt.  Pt enjoys walking when his hips are not a bother to him.  Pt  will sometimes ambulate the entire exercise time. Pt with increase of the nustep to level 5 I anticipate pt will show measurable progress toward pulmonary rehab goals in the the next 30 day assessment    Expected Outcomes  See Admission Goals/Outcomes       ITP Comments: ITP Comments    Row Name 04/06/18 1015 04/29/18 1034 05/27/18 1701       ITP Comments  Dr. Jennet Maduro,  Medical Director  Dr. Jennet Maduro,  Medical Director  Dr. Jennet Maduro,  Medical Director Pulmonary Rehab        Comments: Pt has completed 10 exercise sessions. Continue to monitor. Cherre Huger, BSN Cardiac and Training and development officer

## 2018-05-28 ENCOUNTER — Encounter (HOSPITAL_COMMUNITY): Payer: 59

## 2018-05-28 ENCOUNTER — Encounter (HOSPITAL_COMMUNITY)
Admission: RE | Admit: 2018-05-28 | Discharge: 2018-05-28 | Disposition: A | Discharge status: Home / Self Care | Payer: 59 | Source: Ambulatory Visit | Attending: Orthopedic Surgery | Admitting: Orthopedic Surgery

## 2018-05-28 DIAGNOSIS — J438 Other emphysema: Secondary | ICD-10-CM | POA: Diagnosis not present

## 2018-05-28 DIAGNOSIS — Z87891 Personal history of nicotine dependence: Secondary | ICD-10-CM | POA: Diagnosis not present

## 2018-05-28 DIAGNOSIS — Z791 Long term (current) use of non-steroidal anti-inflammatories (NSAID): Secondary | ICD-10-CM | POA: Diagnosis not present

## 2018-05-28 DIAGNOSIS — Z79899 Other long term (current) drug therapy: Secondary | ICD-10-CM | POA: Diagnosis not present

## 2018-05-28 DIAGNOSIS — I10 Essential (primary) hypertension: Secondary | ICD-10-CM | POA: Diagnosis not present

## 2018-05-28 NOTE — Progress Notes (Signed)
Daily Session Note  Patient Details  Name: Travis Salinas MRN: [id number] Date of Birth: 21-Dec-1950 Referring Provider:     Pulmonary Rehab Walk Test from 04/07/2018 in [place]  Referring Provider  Dr. Chase Caller      Encounter Date: 05/28/2018  Check In: Session Check In - 05/28/18 1538      Check-In   Supervising physician immediately available to respond to emergencies  [facility] immediately available    Physician(s)  Dr. Toma Copier    Location  [facility]    Staff Present  Maurice Small, RN, BSN;Genasis Zingale, MS, ACSM RCEP, Exercise Physiologist;Lisa Ysidro Evert, Felipe Drone, RN, MHA    Medication changes reported      No    Tobacco Cessation  No Change    Warm-up and Cool-down  Performed as group-led instruction    Resistance Training Performed  Yes    VAD Patient?  No    PAD/SET Patient?  No      Pain Assessment   Currently in Pain?  No/denies    Pain Score  0-No pain    Multiple Pain Sites  No       Capillary Blood Glucose: No results found for this or any previous visit (from the past 24 hour(s)).    Social History   Tobacco Use  Smoking Status Former Smoker  . Packs/day: 1.00  . Years: 32.00  . Pack years: 32.00  . Types: Cigarettes  . Last attempt to quit: 2002  . Years since quitting: 17.7  Smokeless Tobacco Never Used    Goals Met:  Exercise tolerated well  Goals Unmet:  Not Applicable  Comments: Service time is from 1:30p to 3:25p    Dr. Rush Farmer is Medical Director for Pulmonary Rehab at [facility].

## 2018-06-02 ENCOUNTER — Encounter (HOSPITAL_COMMUNITY): Payer: 59

## 2018-06-02 ENCOUNTER — Encounter (HOSPITAL_COMMUNITY)
Admission: RE | Admit: 2018-06-02 | Discharge: 2018-06-02 | Disposition: A | Discharge status: Home / Self Care | Payer: 59 | Source: Ambulatory Visit | Attending: Internal Medicine | Admitting: Internal Medicine

## 2018-06-02 DIAGNOSIS — I10 Essential (primary) hypertension: Secondary | ICD-10-CM | POA: Diagnosis not present

## 2018-06-02 DIAGNOSIS — J438 Other emphysema: Secondary | ICD-10-CM | POA: Diagnosis not present

## 2018-06-02 DIAGNOSIS — Z87891 Personal history of nicotine dependence: Secondary | ICD-10-CM | POA: Diagnosis not present

## 2018-06-02 DIAGNOSIS — Z791 Long term (current) use of non-steroidal anti-inflammatories (NSAID): Secondary | ICD-10-CM | POA: Diagnosis not present

## 2018-06-02 DIAGNOSIS — Z79899 Other long term (current) drug therapy: Secondary | ICD-10-CM | POA: Diagnosis not present

## 2018-06-02 NOTE — Progress Notes (Signed)
Daily Session Note  Patient Details 2 Name: Travis Salinas MRN: [id number] Date of Birth: 24-Jul-1951 Referring Provider:     Pulmonary Rehab Walk Test from 04/07/2018 in [place]  Referring Provider  Dr. Chase Caller      Encounter Date: 06/02/2018  Check In: Session Check In - 06/02/18 1515      Check-In   Supervising physician immediately available to respond to emergencies  [facility] immediately available    Physician(s)  Dr.Danford     Location  [facility]    Staff Present  Maurice Small, RN, BSN;Margaretann Abate, MS, ACSM RCEP, Exercise Physiologist;Annedrea Stackhouse, RN, MHA;Maria Whitaker, RN, BSN    Medication changes reported      No    Fall or balance concerns reported     No    Tobacco Cessation  No Change    Warm-up and Cool-down  Performed as group-led Higher education careers adviser Performed  Yes    VAD Patient?  No    PAD/SET Patient?  No      Pain Assessment   Currently in Pain?  No/denies       Capillary Blood Glucose: No results found for this or any previous visit (from the past 24 hour(s)).    Social History   Tobacco Use  Smoking Status Former Smoker  . Packs/day: 1.00  . Years: 32.00  . Pack years: 32.00  . Types: Cigarettes  . Last attempt to quit: 2002  . Years since quitting: 17.7  Smokeless Tobacco Never Used    Goals Met:  Exercise tolerated well  Goals Unmet:  Not Applicable  Comments: Service time is from 1:30p to 3:15p    Dr. Rush Farmer is Medical Director for Pulmonary Rehab at [facility].

## 2018-06-04 ENCOUNTER — Encounter (HOSPITAL_COMMUNITY)
Admission: RE | Admit: 2018-06-04 | Discharge: 2018-06-04 | Disposition: A | Discharge status: Home / Self Care | Payer: 59 | Source: Ambulatory Visit | Attending: Internal Medicine | Admitting: Internal Medicine

## 2018-06-04 ENCOUNTER — Encounter (HOSPITAL_COMMUNITY): Payer: 59

## 2018-06-04 DIAGNOSIS — Z79899 Other long term (current) drug therapy: Secondary | ICD-10-CM | POA: Diagnosis not present

## 2018-06-04 DIAGNOSIS — J438 Other emphysema: Secondary | ICD-10-CM | POA: Diagnosis not present

## 2018-06-04 DIAGNOSIS — I10 Essential (primary) hypertension: Secondary | ICD-10-CM | POA: Diagnosis not present

## 2018-06-04 DIAGNOSIS — Z791 Long term (current) use of non-steroidal anti-inflammatories (NSAID): Secondary | ICD-10-CM | POA: Diagnosis not present

## 2018-06-04 DIAGNOSIS — Z87891 Personal history of nicotine dependence: Secondary | ICD-10-CM | POA: Diagnosis not present

## 2018-06-04 NOTE — Progress Notes (Signed)
Daily Session Note  Patient Details  Name: Travis Salinas MRN: [id number] Date of Birth: 12-14-1950 Referring Provider:     Pulmonary Rehab Walk Test from 04/07/2018 in [place]  Referring Provider  Dr. Chase Caller      Encounter Date: 06/04/2018  Check In: Session Check In - 06/04/18 1403      Check-In   Supervising physician immediately available to respond to emergencies  [facility] immediately available    Physician(s)  Dr.Danford     Location  [facility]    Staff Present  Maurice Small, RN, BSN;Deran Barro, MS, ACSM RCEP, Exercise Physiologist;Annedrea Stackhouse, RN, [place]    Medication changes reported      No    Fall or balance concerns reported     No    Tobacco Cessation  No Change    Warm-up and Cool-down  Performed as group-led instruction    Resistance Training Performed  Yes    VAD Patient?  No    PAD/SET Patient?  No      Pain Assessment   Currently in Pain?  No/denies       Capillary Blood Glucose: No results found for this or any previous visit (from the past 24 hour(s)).    Social History   Tobacco Use  Smoking Status Former Smoker  . Packs/day: 1.00  . Years: 32.00  . Pack years: 32.00  . Types: Cigarettes  . Last attempt to quit: 2002  . Years since quitting: 17.7  Smokeless Tobacco Never Used    Goals Met:  Exercise tolerated well  Goals Unmet:  Not Applicable  Comments: Service time is from 1:30p to 3:30p    Dr. Rush Farmer is Medical Director for Pulmonary Rehab at [facility]

## 2018-06-09 ENCOUNTER — Encounter (HOSPITAL_COMMUNITY)
Admission: RE | Admit: 2018-06-09 | Discharge: 2018-06-09 | Disposition: A | Discharge status: Home / Self Care | Payer: 59 | Source: Ambulatory Visit | Attending: Internal Medicine | Admitting: Internal Medicine

## 2018-06-09 ENCOUNTER — Encounter (HOSPITAL_COMMUNITY): Payer: 59

## 2018-06-09 DIAGNOSIS — I10 Essential (primary) hypertension: Secondary | ICD-10-CM | POA: Insufficient documentation

## 2018-06-09 DIAGNOSIS — Z791 Long term (current) use of non-steroidal anti-inflammatories (NSAID): Secondary | ICD-10-CM | POA: Diagnosis not present

## 2018-06-09 DIAGNOSIS — Z87891 Personal history of nicotine dependence: Secondary | ICD-10-CM | POA: Insufficient documentation

## 2018-06-09 DIAGNOSIS — Z79899 Other long term (current) drug therapy: Secondary | ICD-10-CM | POA: Insufficient documentation

## 2018-06-09 DIAGNOSIS — J438 Other emphysema: Secondary | ICD-10-CM | POA: Insufficient documentation

## 2018-06-09 NOTE — Progress Notes (Signed)
Daily Session Note  Patient Details  Name: Travis Salinas MRN: [id number] Date of Birth: March 29, 1951 Referring Provider:     Pulmonary Rehab Walk Test from 04/07/2018 in [place]  Referring Provider  Dr. Chase Caller      Encounter Date: 06/09/2018  Check In: Session Check In - 06/09/18 1451      Check-In   Supervising physician immediately available to respond to emergencies  [facility] immediately available    Physician(s)  Dr. Louanne Belton    Location  [facility]    Staff Present  Maurice Small, RN, BSN;Molly DiVincenzo, MS, ACSM RCEP, Exercise Physiologist;Annedrea Rosezella [place], RN, Ramonita Lab, RN    Medication changes reported      No    Fall or balance concerns reported     No    Tobacco Cessation  No Change    Warm-up and Cool-down  Performed as group-led instruction    Resistance Training Performed  Yes    VAD Patient?  No    PAD/SET Patient?  No      Pain Assessment   Currently in Pain?  No/denies       Capillary Blood Glucose: No results found for this or any previous visit (from the past 24 hour(s)).  Exercise Prescription Changes - 06/09/18 1600      Response to Exercise   Blood Pressure (Admit)  136/62    Blood Pressure (Exercise)  144/64    Blood Pressure (Exit)  134/80    Heart Rate (Admit)  78 bpm    Heart Rate (Exercise)  106 bpm    Heart Rate (Exit)  71 bpm    Oxygen Saturation (Admit)  99 %    Oxygen Saturation (Exercise)  93 %    Oxygen Saturation (Exit)  97 %    Rating of Perceived Exertion (Exercise)  12    Perceived Dyspnea (Exercise)  2    Duration  Progress to 45 minutes of aerobic exercise without signs/symptoms of physical distress    Intensity  THRR unchanged      Progression   Progression  Continue to progress workloads to maintain intensity without signs/symptoms of physical distress.      Resistance Training   Training Prescription  Yes    Weight  blue bands    Reps   10-15    Time  10 Minutes      Track   HQPR  91    Minutes  51       Social History   Tobacco Use  Smoking Status Former Smoker  . Packs/day: 1.00  . Years: 32.00  . Pack years: 32.00  . Types: Cigarettes  . Last attempt to quit: 2002  . Years since quitting: 17.7  Smokeless Tobacco Never Used    Goals Met:  Exercise tolerated well No report of cardiac concerns or symptoms Strength training completed today  Goals Unmet:  Not Applicable  Comments: Service time is from 1330 to 1515    Dr. Rush Farmer is Medical Director for Pulmonary Rehab at [facility].

## 2018-06-11 ENCOUNTER — Encounter (HOSPITAL_COMMUNITY): Payer: 59

## 2018-06-11 ENCOUNTER — Encounter (HOSPITAL_COMMUNITY)
Admission: RE | Admit: 2018-06-11 | Discharge: 2018-06-11 | Disposition: A | Discharge status: Home / Self Care | Payer: 59 | Source: Ambulatory Visit

## 2018-06-11 ENCOUNTER — Encounter (HOSPITAL_COMMUNITY)
Admission: RE | Admit: 2018-06-11 | Discharge: 2018-06-11 | Disposition: A | Discharge status: Home / Self Care | Payer: 59 | Source: Ambulatory Visit | Attending: Internal Medicine | Admitting: Internal Medicine

## 2018-06-11 DIAGNOSIS — J438 Other emphysema: Secondary | ICD-10-CM | POA: Diagnosis not present

## 2018-06-11 DIAGNOSIS — I10 Essential (primary) hypertension: Secondary | ICD-10-CM | POA: Diagnosis not present

## 2018-06-11 DIAGNOSIS — Z87891 Personal history of nicotine dependence: Secondary | ICD-10-CM | POA: Diagnosis not present

## 2018-06-11 DIAGNOSIS — Z79899 Other long term (current) drug therapy: Secondary | ICD-10-CM | POA: Diagnosis not present

## 2018-06-11 DIAGNOSIS — Z791 Long term (current) use of non-steroidal anti-inflammatories (NSAID): Secondary | ICD-10-CM | POA: Diagnosis not present

## 2018-06-11 NOTE — Progress Notes (Signed)
Daily Session Note  Patient Details  Name: Travis Salinas MRN: [id number] Date of Birth: 03-13-1951 Referring Provider:     Pulmonary Rehab Walk Test from 04/07/2018 in [place]  Referring Provider  Dr. Chase Caller      Encounter Date: 06/11/2018  Check In:   Capillary Blood Glucose: No results found for this or any previous visit (from the past 24 hour(s)).    Social History   Tobacco Use  Smoking Status Former Smoker  . Packs/day: 1.00  . Years: 32.00  . Pack years: 32.00  . Types: Cigarettes  . Last attempt to quit: 2002  . Years since quitting: 17.7  Smokeless Tobacco Never Used    Goals Met:  Exercise tolerated well No report of cardiac concerns or symptoms Strength training completed today  Goals Unmet:  Not Applicable  Comments: Service time is from 1330 to 1530    Dr. Rush Farmer is Medical Director for Pulmonary Rehab at [facility]

## 2018-06-16 ENCOUNTER — Encounter (HOSPITAL_COMMUNITY)
Admission: RE | Admit: 2018-06-16 | Discharge: 2018-06-16 | Disposition: A | Discharge status: Home / Self Care | Payer: 59 | Source: Ambulatory Visit | Attending: Internal Medicine | Admitting: Internal Medicine

## 2018-06-16 ENCOUNTER — Encounter (HOSPITAL_COMMUNITY): Payer: 59

## 2018-06-16 VITALS — Wt 151.9 lb

## 2018-06-16 DIAGNOSIS — L0390 Cellulitis, unspecified: Secondary | ICD-10-CM | POA: Diagnosis not present

## 2018-06-16 DIAGNOSIS — Z87891 Personal history of nicotine dependence: Secondary | ICD-10-CM | POA: Diagnosis not present

## 2018-06-16 DIAGNOSIS — Z791 Long term (current) use of non-steroidal anti-inflammatories (NSAID): Secondary | ICD-10-CM | POA: Diagnosis not present

## 2018-06-16 DIAGNOSIS — J438 Other emphysema: Secondary | ICD-10-CM | POA: Diagnosis not present

## 2018-06-16 DIAGNOSIS — I10 Essential (primary) hypertension: Secondary | ICD-10-CM | POA: Diagnosis not present

## 2018-06-16 DIAGNOSIS — I5030 Unspecified diastolic (congestive) heart failure: Secondary | ICD-10-CM | POA: Diagnosis not present

## 2018-06-16 DIAGNOSIS — R2681 Unsteadiness on feet: Secondary | ICD-10-CM | POA: Diagnosis not present

## 2018-06-16 DIAGNOSIS — J189 Pneumonia, unspecified organism: Secondary | ICD-10-CM | POA: Diagnosis not present

## 2018-06-16 DIAGNOSIS — Z79899 Other long term (current) drug therapy: Secondary | ICD-10-CM | POA: Diagnosis not present

## 2018-06-16 DIAGNOSIS — L0291 Cutaneous abscess, unspecified: Secondary | ICD-10-CM | POA: Diagnosis not present

## 2018-06-16 DIAGNOSIS — J449 Chronic obstructive pulmonary disease, unspecified: Secondary | ICD-10-CM | POA: Diagnosis not present

## 2018-06-16 NOTE — Progress Notes (Signed)
Daily Session Note  Patient Details  Name: Travis Salinas MRN: [id number] Date of Birth: [birth year] Referring Provider:     Pulmonary Rehab Walk Test from 04/07/2018 in [place]  Referring Provider  Dr. Chase Caller      Encounter Date: 06/16/2018  Check In: Session Check In - 06/16/18 1511      Check-In   Supervising physician immediately available to respond to emergencies  [facility] immediately available    Physician(s)  Dr. Horris Latino    Location  [facility]    Staff Present  Su Hilt, MS, ACSM RCEP, Exercise Physiologist;Alazay Leicht Colletta [place], RN, MHA    Medication changes reported      No    Fall or balance concerns reported     No    Tobacco Cessation  No Change    Warm-up and Cool-down  Performed as group-led instruction    Resistance Training Performed  Yes    VAD Patient?  No    PAD/SET Patient?  No      Pain Assessment   Currently in Pain?  No/denies    Pain Score  0-No pain    Multiple Pain Sites  No       Capillary Blood Glucose: No results found for this or any previous visit (from the past 24 hour(s)).    Social History   Tobacco Use  Smoking Status Former Smoker  . Packs/day: 1.00  . Years: 32.00  . Pack years: 32.00  . Types: Cigarettes  . Last attempt to quit: 2002  . Years since quitting: 17.7  Smokeless Tobacco Never Used    Goals Met:  Exercise tolerated well No report of cardiac concerns or symptoms Strength training completed today  Goals Unmet:  Not Applicable  Comments: Service time is from 1330 to 1500    Dr. Rush Farmer is Medical Director for Pulmonary Rehab at [facility].

## 2018-06-18 ENCOUNTER — Telehealth (HOSPITAL_COMMUNITY): Payer: Self-pay | Admitting: Family Medicine

## 2018-06-18 ENCOUNTER — Encounter (HOSPITAL_COMMUNITY): Payer: 59

## 2018-06-23 ENCOUNTER — Telehealth (HOSPITAL_COMMUNITY): Payer: Self-pay

## 2018-06-23 ENCOUNTER — Encounter (HOSPITAL_COMMUNITY): Payer: 59

## 2018-06-23 NOTE — Telephone Encounter (Signed)
Pt called and left vm stating he will not be attending rehab today as his sister past away. He has left to [facility] and is unsure when he will return.

## 2018-06-24 DIAGNOSIS — Z23 Encounter for immunization: Secondary | ICD-10-CM | POA: Diagnosis not present

## 2018-06-24 DIAGNOSIS — L723 Sebaceous cyst: Secondary | ICD-10-CM | POA: Diagnosis not present

## 2018-06-25 ENCOUNTER — Encounter (HOSPITAL_COMMUNITY): Payer: 59

## 2018-06-25 ENCOUNTER — Encounter (HOSPITAL_COMMUNITY)
Admission: RE | Admit: 2018-06-25 | Discharge: 2018-06-25 | Disposition: A | Discharge status: Home / Self Care | Payer: 59 | Source: Ambulatory Visit | Attending: Internal Medicine | Admitting: Internal Medicine

## 2018-06-25 DIAGNOSIS — J438 Other emphysema: Secondary | ICD-10-CM

## 2018-06-25 DIAGNOSIS — I10 Essential (primary) hypertension: Secondary | ICD-10-CM | POA: Diagnosis not present

## 2018-06-25 DIAGNOSIS — Z87891 Personal history of nicotine dependence: Secondary | ICD-10-CM | POA: Diagnosis not present

## 2018-06-25 DIAGNOSIS — Z79899 Other long term (current) drug therapy: Secondary | ICD-10-CM | POA: Diagnosis not present

## 2018-06-25 DIAGNOSIS — Z791 Long term (current) use of non-steroidal anti-inflammatories (NSAID): Secondary | ICD-10-CM | POA: Diagnosis not present

## 2018-06-25 NOTE — Progress Notes (Signed)
Pulmonary Individual Treatment Plan  Patient Details  Name: Travis Salinas MRN: [id number] Date of Birth: November 07, 1950 Referring Provider:     Pulmonary Rehab Walk Test from 04/07/2018 in [place]  Referring Provider  Dr. Chase Caller      Initial Encounter Date:    Pulmonary Rehab Walk Test from 04/07/2018 in [place]  Date  04/07/18      Visit Diagnosis: Other emphysema ([place])  Patient's Home Medications on Admission:   Current Outpatient Medications:  .  acetaminophen (TYLENOL) 325 MG tablet, Take 2 tablets (650 mg total) by mouth every 6 (six) hours as needed for mild pain (or Fever >/= 101)., Disp: , Rfl:  .  albuterol (PROVENTIL) (2.5 MG/3ML) 0.083% nebulizer solution, Take 3 mLs (2.5 mg total) by nebulization every 2 (two) hours as needed for wheezing or shortness of breath., Disp: 75 mL, Rfl: 12 .  ARIPiprazole (ABILIFY) 15 MG tablet, 10 mg. 7/29 dosage decreased to 10 mg, Disp: , Rfl: 3 .  atenolol (TENORMIN) 25 MG tablet, Take 1 tablet (25 mg total) by mouth every morning., Disp: , Rfl:  .  clonazePAM (KLONOPIN) 0.5 MG tablet, Take 1 tablet (0.5 mg total) by mouth 2 (two) times daily. (Patient taking differently: Take 1 mg by mouth 2 (two) times daily. ), Disp: 30 tablet, Rfl: 0 .  feeding supplement, ENSURE ENLIVE, (ENSURE ENLIVE) LIQD, Take 237 mLs by mouth 2 (two) times daily between meals. (Patient not taking: Reported on 04/06/2018), Disp: 237 mL, Rfl: 12 .  FLUoxetine (PROZAC) 20 MG capsule, Take 1 capsule (20 mg total) by mouth daily. (Patient taking differently: Take 10 mg by mouth daily. ), Disp: , Rfl: 3 .  fluticasone (FLONASE) 50 MCG/ACT nasal spray, Place 1 spray into both nostrils every morning., Disp: , Rfl: 0 .  ibuprofen (ADVIL,MOTRIN) 600 MG tablet, Take 1 tablet (600 mg total) by mouth 4 (four) times daily., Disp: 30 tablet, Rfl: 0 .  ipratropium-albuterol (DUONEB) 0.5-2.5 (3) MG/3ML SOLN, Take 3  mLs by nebulization 3 (three) times daily. (Patient taking differently: Take 3 mLs by nebulization 3 (three) times daily as needed. ), Disp: 360 mL, Rfl:  .  lamoTRIgine (LAMICTAL) 200 MG tablet, Take 1 tablet (200 mg total) by mouth every morning., Disp: , Rfl:  .  silodosin (RAPAFLO) 8 MG CAPS capsule, Take 8 mg daily with breakfast by mouth., Disp: , Rfl:  .  Tiotropium Bromide Monohydrate (SPIRIVA RESPIMAT) 2.5 MCG/ACT AERS, Inhale 2 puffs into the lungs daily., Disp: 1 Inhaler, Rfl: 5  Past Medical History: Past Medical History:  Diagnosis Date  . Arthritis   . Cervical disc herniation   . COPD (chronic obstructive pulmonary disease) ([place])   . Headache    " due to antibiotics"  . Hepatitis C   . Hypertension     Tobacco Use: Social History   Tobacco Use  Smoking Status Former Smoker  . Packs/day: 1.00  . Years: 32.00  . Pack years: 32.00  . Types: Cigarettes  . Last attempt to quit: 2002  . Years since quitting: 17.8  Smokeless Tobacco Never Used    Labs: Recent Chemical engineer    Labs for ITP Cardiac and Pulmonary Rehab Latest Ref Rng & Units 05/27/2017 05/27/2017 05/28/2017 05/31/2017 06/14/2017   Hemoglobin A1c 4.8 - 5.6 % - - - - 5.9(H)   PHART 7.350 - 7.450 7.461(H) 7.442 7.408 7.467(H) -   PCO2ART 32.0 - 48.0  mmHg 28.2(L) 29.2(L) 36.4 35.3 -   HCO3 20.0 - 28.0 mmol/L 19.8(L) 19.6(L) 22.6 25.1 -   TCO2 0 - 100 mmol/L - - - - -   ACIDBASEDEF 0.0 - 2.0 mmol/L 2.6(H) 3.2(H) 1.3 - -   O2SAT % 89.1 82.5 89.1 98.7 -      Capillary Blood Glucose: No results found for: GLUCAP   Pulmonary Assessment Scores: Pulmonary Assessment Scores    Row Name 04/07/18 1300         ADL UCSD   ADL Phase  Entry       mMRC Score   mMRC Score  0        Pulmonary Function Assessment:   Exercise Target Goals: Exercise Program Goal: Individual exercise prescription set using results from initial 6 min walk test and THRR while considering  patient's activity barriers  and safety.   Exercise Prescription Goal: Initial exercise prescription builds to 30-45 minutes a day of aerobic activity, 2-3 days per week.  Home exercise guidelines will be given to patient during program as part of exercise prescription that the participant will acknowledge.  Activity Barriers & Risk Stratification: Activity Barriers & Cardiac Risk Stratification - 04/06/18 1146      Activity Barriers & Cardiac Risk Stratification   Activity Barriers  Joint Problems;Deconditioning;History of Falls;Neck/Spine Problems;Shortness of Breath    Cardiac Risk Stratification  High       6 Minute Walk: 6 Minute Walk    Row Name 04/07/18 1255         6 Minute Walk   Phase  Discharge     Distance  1310 feet     Walk Time  6 minutes     # of Rest Breaks  0     MPH  2.48     METS  2.91     RPE  11     Perceived Dyspnea   1     Symptoms  No     Resting HR  75 bpm     Resting BP  114/60     Resting Oxygen Saturation   97 %     Exercise Oxygen Saturation  during 6 min walk  93 %     Max Ex. HR  100 bpm     Max Ex. BP  130/60       Interval HR   1 Minute HR  90     2 Minute HR  100     3 Minute HR  97     4 Minute HR  100     5 Minute HR  96     6 Minute HR  101     2 Minute Post HR  88     Interval Heart Rate?  Yes       Interval Oxygen   Interval Oxygen?  Yes     Baseline Oxygen Saturation %  97 %     1 Minute Oxygen Saturation %  96 %     1 Minute Liters of Oxygen  0 L     2 Minute Oxygen Saturation %  93 %     2 Minute Liters of Oxygen  0 L     3 Minute Oxygen Saturation %  94 %     3 Minute Liters of Oxygen  0 L     4 Minute Oxygen Saturation %  94 %     4 Minute Liters of Oxygen  0 L  5 Minute Oxygen Saturation %  95 %     5 Minute Liters of Oxygen  0 L     6 Minute Oxygen Saturation %  95 %     6 Minute Liters of Oxygen  0 L     2 Minute Post Oxygen Saturation %  94 %     2 Minute Post Liters of Oxygen  0 L        Oxygen Initial Assessment: Oxygen  Initial Assessment - 04/07/18 1255      Initial 6 min Walk   Oxygen Used  None      Program Oxygen Prescription   Program Oxygen Prescription  None       Oxygen Re-Evaluation: Oxygen Re-Evaluation    Row Name 04/28/18 0734 05/26/18 0938 06/25/18 0806         Program Oxygen Prescription   Program Oxygen Prescription  None  None  None       Home Oxygen   Home Oxygen Device  Portable Concentrator  Portable Concentrator  Portable Concentrator     Sleep Oxygen Prescription  Continuous  Continuous  Continuous     Liters per minute  2  2  2      Home Exercise Oxygen Prescription  None  None  None     Home at Rest Exercise Oxygen Prescription  None  None  None     Compliance with Home Oxygen Use  Yes  Yes  Yes       Goals/Expected Outcomes   Short Term Goals  -  To learn and exhibit compliance with exercise, home and travel O2 prescription;To learn and understand importance of monitoring SPO2 with pulse oximeter and demonstrate accurate use of the pulse oximeter.;To learn and understand importance of maintaining oxygen saturations>88%;To learn and demonstrate proper pursed lip breathing techniques or other breathing techniques.;To learn and demonstrate proper use of respiratory medications  To learn and exhibit compliance with exercise, home and travel O2 prescription;To learn and understand importance of monitoring SPO2 with pulse oximeter and demonstrate accurate use of the pulse oximeter.;To learn and understand importance of maintaining oxygen saturations>88%;To learn and demonstrate proper pursed lip breathing techniques or other breathing techniques.;To learn and demonstrate proper use of respiratory medications     Long  Term Goals  -  Exhibits compliance with exercise, home and travel O2 prescription;Verbalizes importance of monitoring SPO2 with pulse oximeter and return demonstration;Maintenance of O2 saturations>88%;Exhibits proper breathing techniques, such as pursed lip breathing  or other method taught during program session;Compliance with respiratory medication;Demonstrates proper use of MDI's  Exhibits compliance with exercise, home and travel O2 prescription;Verbalizes importance of monitoring SPO2 with pulse oximeter and return demonstration;Maintenance of O2 saturations>88%;Exhibits proper breathing techniques, such as pursed lip breathing or other method taught during program session;Compliance with respiratory medication;Demonstrates proper use of MDI's     Goals/Expected Outcomes  Patient to maintain compliance with night o2  Patient to maintain compliance with night o2  Patient to maintain compliance with night o2        Oxygen Discharge (Final Oxygen Re-Evaluation): Oxygen Re-Evaluation - 06/25/18 0806      Program Oxygen Prescription   Program Oxygen Prescription  None      Home Oxygen   Home Oxygen Device  Portable Concentrator    Sleep Oxygen Prescription  Continuous    Liters per minute  2    Home Exercise Oxygen Prescription  None    Home at Rest Exercise Oxygen Prescription  None  Compliance with Home Oxygen Use  Yes      Goals/Expected Outcomes   Short Term Goals  To learn and exhibit compliance with exercise, home and travel O2 prescription;To learn and understand importance of monitoring SPO2 with pulse oximeter and demonstrate accurate use of the pulse oximeter.;To learn and understand importance of maintaining oxygen saturations>88%;To learn and demonstrate proper pursed lip breathing techniques or other breathing techniques.;To learn and demonstrate proper use of respiratory medications    Long  Term Goals  Exhibits compliance with exercise, home and travel O2 prescription;Verbalizes importance of monitoring SPO2 with pulse oximeter and return demonstration;Maintenance of O2 saturations>88%;Exhibits proper breathing techniques, such as pursed lip breathing or other method taught during program session;Compliance with respiratory  medication;Demonstrates proper use of MDI's    Goals/Expected Outcomes  Patient to maintain compliance with night o2       Initial Exercise Prescription: Initial Exercise Prescription - 04/07/18 1300      Date of Initial Exercise RX and Referring Provider   Date  04/07/18    Referring Provider  Dr. Chase Caller      Bike   Level  0.5    Minutes  17      NuStep   Level  4    SPM  80    Minutes  17    METs  2      Track   Laps  15    Minutes  17      Prescription Details   Frequency (times per week)  2    Duration  Progress to 45 minutes of aerobic exercise without signs/symptoms of physical distress      Intensity   THRR 40-80% of Max Heartrate  62-123    Ratings of Perceived Exertion  11-13    Perceived Dyspnea  0-4      Progression   Progression  Continue progressive overload as per policy without signs/symptoms or physical distress.      Resistance Training   Training Prescription  Yes    Weight  blue bands    Reps  10-15       Perform Capillary Blood Glucose checks as needed.  Exercise Prescription Changes:  Exercise Prescription Changes    Row Name 04/14/18 1500 04/28/18 1600 05/07/18 0847 05/26/18 1600 06/09/18 1600     Response to Exercise   Blood Pressure (Admit)  120/60  128/78  132/72  128/68  136/62   Blood Pressure (Exercise)  132/72  138/82  130/76  110/70  144/64   Blood Pressure (Exit)  122/70  122/72  130/76  120/78  134/80   Heart Rate (Admit)  68 bpm  68 bpm  60 bpm  87 bpm  78 bpm   Heart Rate (Exercise)  80 bpm  75 bpm  84 bpm  103 bpm  106 bpm   Heart Rate (Exit)  58 bpm  66 bpm  84 bpm  93 bpm  71 bpm   Oxygen Saturation (Admit)  97 %  98 %  97 %  98 %  99 %   Oxygen Saturation (Exercise)  95 %  97 %  95 %  96 %  93 %   Oxygen Saturation (Exit)  97 %  97 %  95 %  97 %  97 %   Rating of Perceived Exertion (Exercise)  13  11  7  13  12    Perceived Dyspnea (Exercise)  3  1  1  2   2  Duration  Progress to 45 minutes of aerobic exercise  without signs/symptoms of physical distress  Progress to 45 minutes of aerobic exercise without signs/symptoms of physical distress  Progress to 45 minutes of aerobic exercise without signs/symptoms of physical distress  Progress to 45 minutes of aerobic exercise without signs/symptoms of physical distress  Progress to 45 minutes of aerobic exercise without signs/symptoms of physical distress   Intensity  Other (comment)  THRR unchanged  THRR unchanged  THRR unchanged  THRR unchanged     Progression   Progression  Continue to progress workloads to maintain intensity without signs/symptoms of physical distress.  Continue to progress workloads to maintain intensity without signs/symptoms of physical distress.  Continue to progress workloads to maintain intensity without signs/symptoms of physical distress.  Continue to progress workloads to maintain intensity without signs/symptoms of physical distress.  Continue to progress workloads to maintain intensity without signs/symptoms of physical distress.     Resistance Training   Training Prescription  Yes  Yes  Yes  Yes  Yes   Weight  blue bands  blue bands  blue bands  blue bands  blue bands   Reps  10-15  10-15  10-15  10-15  10-15   Time  10 Minutes  10 Minutes  10 Minutes  10 Minutes  10 Minutes     Bike   Level  0.5  3  -  -  -   Minutes  17  17  -  -  -     NuStep   Level  -  -  -  5  -   SPM  -  -  -  80  -   Minutes  -  -  -  17  -   METs  -  -  -  2.2  -     Track   Laps  26  27  26   32  52   Minutes  17  17  34  65  [place] Name 06/16/18 0829             Response to Exercise   Blood Pressure (Admit)  138/60       Blood Pressure (Exercise)  132/72       Blood Pressure (Exit)  120/64       Heart Rate (Admit)  74 bpm       Heart Rate (Exercise)  85 bpm       Heart Rate (Exit)  76 bpm       Oxygen Saturation (Admit)  95 %       Oxygen Saturation (Exercise)  95 %       Oxygen Saturation (Exit)  96 %       Rating of Perceived  Exertion (Exercise)  13       Perceived Dyspnea (Exercise)  2       Duration  Progress to 45 minutes of aerobic exercise without signs/symptoms of physical distress       Intensity  THRR unchanged         Progression   Progression  Continue to progress workloads to maintain intensity without signs/symptoms of physical distress.         Resistance Training   Training Prescription  Yes       Weight  blue bands       Reps  10-15       Time  10 Minutes  NuStep   Level  5       SPM  80       Minutes  34       METs  1.3         Track   Laps  16       Minutes  17          Exercise Comments:   Exercise Goals and Review:   Exercise Goals Re-Evaluation : Exercise Goals Re-Evaluation    Row Name 04/06/18 1038 04/28/18 0734 05/26/18 0938 06/25/18 0807       Exercise Goal Re-Evaluation   Exercise Goals Review  Increase Physical Activity;Able to understand and use Dyspnea scale;Understanding of Exercise Prescription;Increase Strength and Stamina;Knowledge and understanding of Target Heart Rate Range (THRR);Able to understand and use rate of perceived exertion (RPE) scale  Increase Physical Activity;Able to understand and use Dyspnea scale;Understanding of Exercise Prescription;Increase Strength and Stamina;Knowledge and understanding of Target Heart Rate Range (THRR);Able to understand and use rate of perceived exertion (RPE) scale  Increase Physical Activity;Able to understand and use Dyspnea scale;Understanding of Exercise Prescription;Increase Strength and Stamina;Knowledge and understanding of Target Heart Rate Range (THRR);Able to understand and use rate of perceived exertion (RPE) scale  Increase Physical Activity;Able to understand and use Dyspnea scale;Understanding of Exercise Prescription;Increase Strength and Stamina;Knowledge and understanding of Target Heart Rate Range (THRR);Able to understand and use rate of perceived exertion (RPE) scale    Comments  -  Patient has  only attended 2 rehab sessions. Attendance has been consistent due to not always having transportation. Will cont. to monitor and motivate as able.  Patient and I have discussed how to increase his workloads. Patient is interested in doing the weights, however, I think he needs to focus on his cardiovascular health at this point in time. Patient stated understanding. Will cont to monitor and motivate. Will dicuss importance of home exercise.   Patient and I have discussed how to increase his workloads. Patient is interested in doing the weights, however, I think he needs to focus on his cardiovascular health at this point in time. Patient stated understanding. Will cont to monitor and motivate. Will dicuss importance of home exercise.     Expected Outcomes   Through exercise at rehab and at home, patient will increase strength and stamina. Patient will also gain the confidence and knowledge to adhere to a exercise regime at home.   Through exercise at rehab and at home, patient will increase strength and stamina. Patient will also gain the confidence and knowledge to adhere to a exercise regime at home.   Through exercise at rehab and at home, patient will increase strength and stamina. Patient will also gain the confidence and knowledge to adhere to a exercise regime at home.   Through exercise at rehab and at home, patient will increase strength and stamina. Patient will also gain the confidence and knowledge to adhere to a exercise regime at home.       Discharge Exercise Prescription (Final Exercise Prescription Changes): Exercise Prescription Changes - 06/16/18 0829      Response to Exercise   Blood Pressure (Admit)  138/60    Blood Pressure (Exercise)  132/72    Blood Pressure (Exit)  120/64    Heart Rate (Admit)  74 bpm    Heart Rate (Exercise)  85 bpm    Heart Rate (Exit)  76 bpm    Oxygen Saturation (Admit)  95 %    Oxygen Saturation (Exercise)  95 %    Oxygen Saturation (Exit)  96 %     Rating of Perceived Exertion (Exercise)  13    Perceived Dyspnea (Exercise)  2    Duration  Progress to 45 minutes of aerobic exercise without signs/symptoms of physical distress    Intensity  THRR unchanged      Progression   Progression  Continue to progress workloads to maintain intensity without signs/symptoms of physical distress.      Resistance Training   Training Prescription  Yes    Weight  blue bands    Reps  10-15    Time  10 Minutes      NuStep   Level  5    SPM  80    Minutes  34    METs  1.3      Track   Laps  16    Minutes  17       Nutrition:  Target Goals: Understanding of nutrition guidelines, daily intake of sodium 1500mg , cholesterol 200mg , calories 30% from fat and 7% or less from saturated fats, daily to have 5 or more servings of fruits and vegetables.  Biometrics:    Nutrition Therapy Plan and Nutrition Goals: Nutrition Therapy & Goals - 04/06/18 1340      Nutrition Therapy   Diet  Low Sodium       Personal Nutrition Goals   Nutrition Goal  pt to identify and limit food sources of sodium    Personal Goal #2  The pt will consume high-energy, high-nutrient dense beverages when necessary to compensate for decreased oral intake of solid foods.      Intervention Plan   Intervention  Prescribe, educate and counsel regarding individualized specific dietary modifications aiming towards targeted core components such as weight, hypertension, lipid management, diabetes, heart failure and other comorbidities.    Expected Outcomes  Short Term Goal: Understand basic principles of dietary content, such as calories, fat, sodium, cholesterol and nutrients.;Long Term Goal: Adherence to prescribed nutrition plan.       Nutrition Assessments: Nutrition Assessments - 04/06/18 1358      Rate Your Plate Scores   Pre Score  57       Nutrition Goals Re-Evaluation: Nutrition Goals Re-Evaluation    [place] Name 04/16/18 1503             Goals   Nutrition  Goal  pt to identify and limit food sources of sodium       Comment  Identify food quantities necessary to achieve wt maintenance/ gain of  -2# per week to a goal wt loss of 6-15 lb at graduation from pulmonary rehab.         Personal Goal #2 Re-Evaluation   Personal Goal #2  The pt will consume high-energy, high-nutrient dense beverages when necessary to compensate for decreased oral intake of solid foods.          Nutrition Goals Discharge (Final Nutrition Goals Re-Evaluation): Nutrition Goals Re-Evaluation - 04/16/18 1503      Goals   Nutrition Goal  pt to identify and limit food sources of sodium    Comment  Identify food quantities necessary to achieve wt maintenance/ gain of  -2# per week to a goal wt loss of 6-15 lb at graduation from pulmonary rehab.      Personal Goal #2 Re-Evaluation   Personal Goal #2  The pt will consume high-energy, high-nutrient dense beverages when necessary to compensate for decreased oral intake of solid foods.  Psychosocial: Target Goals: Acknowledge presence or absence of significant depression and/or stress, maximize coping skills, provide positive support system. Participant is able to verbalize types and ability to use techniques and skills needed for reducing stress and depression.  Initial Review & Psychosocial Screening: Initial Psych Review & Screening - 04/06/18 1142      Initial Review   Comments  Pt complains of feeling bored.  Pt would like to do more but his shortness of breath keeps him from do much physical activity.  As a result he sits around the house thinking about all the things he can not do anymore and this makes him feel stressed.  Pt is on prozac, klonipin, abilify       Quality of Life Scores:  Scores of 19 and below usually indicate a poorer quality of life in these areas.  A difference of  2-3 points is a clinically meaningful difference.  A difference of 2-3 points in the total score of the Quality of Life Index  has been associated with significant improvement in overall quality of life, self-image, physical symptoms, and general health in studies assessing change in quality of life.  PHQ-9: Recent Review Flowsheet Data    Depression screen [facility] 2/9 04/06/2018 10/27/2017 11/25/2016 09/19/2016 05/15/2016   Decreased Interest 1 1 1 1 1    Down, Depressed, Hopeless 2 2 1 1 1    PHQ - 2 Score 3 3 2 2 2    Altered sleeping 1 0 2 0 3   Tired, decreased energy 3 2 1 1 2    Change in appetite 0 0 2 0 2   Feeling bad or failure about yourself  1 - 1 0 1   Trouble concentrating 0 0 0 0 2   Moving slowly or fidgety/restless 1 0 1 1 2    Suicidal thoughts 0 0 0 0 0   PHQ-9 Score 9 5 9 4 14    Difficult doing work/chores Somewhat difficult Not difficult at all Somewhat difficult Somewhat difficult Very difficult     Interpretation of Total Score  Total Score Depression Severity:  1-4 = Minimal depression, 5-9 = Mild depression, 10-14 = Moderate depression, 15-19 = Moderately severe depression, 20-27 = Severe depression   Psychosocial Evaluation and Intervention: Psychosocial Evaluation - 06/25/18 1035      Psychosocial Evaluation & Interventions   Interventions  Encouraged to exercise with the program and follow exercise prescription    Comments  Pt is hopeful that by participating in pulmonary rehab he will get out of the house more. Pt interacts with staff but very little with fellow participants.  Difficult to fully assess pt needs.    Expected Outcomes  patient will remain free from psychosocial barriers to participation in pulmonary rehab    Continue Psychosocial Services   Follow up required by staff       Psychosocial Re-Evaluation: Psychosocial Re-Evaluation    [place] Name 04/29/18 1051 04/29/18 1052 05/27/18 1702 06/25/18 1035       Psychosocial Re-Evaluation   Current issues with  Current Depression;History of Depression;Current Psychotropic Meds  Current Depression;Current Sleep Concerns;Current Stress  Concerns  Current Depression;Current Sleep Concerns;Current Stress Concerns  Current Depression;Current Sleep Concerns;Current Stress Concerns    Comments  -  Pt feels he has depression because he is unable to drive to get out and do for himself.  He must depend on others to get him where he needs to go.  Pt is hopeful that participating in pulmonary rehab will get him  out of the house more.  Engages with fellow classmates at times.  Pt feels he has depression because he is unable to drive to get out and do for himself.  He must depend on others to get him where he needs to go.  Pt is hopeful that participating in pulmonary rehab will get him out of the house more.  Pt arrives late and leaves early.  Pt with general malaise dalily.  Pt feels he has depression because he is unable to drive to get out and do for himself.  He must depend on others to get him where he needs to go.  Pt is hopeful that participating in pulmonary rehab will get him out of the house more.  Pt arrives late and leaves early.  Pt with general malaise dalily.    Expected Outcomes  patient will remain free from psychosocial barriers to participation in pulmonary rehab  patient will remain free from psychosocial barriers to participation in pulmonary rehab  -  patient will remain free from psychosocial barriers to participation in pulmonary rehab    Interventions  Encouraged to attend Pulmonary Rehabilitation for the exercise;Relaxation education;Stress management education  Encouraged to attend Pulmonary Rehabilitation for the exercise;Relaxation education;Stress management education  -  Encouraged to attend Pulmonary Rehabilitation for the exercise;Relaxation education;Stress management education    Continue Psychosocial Services   Follow up required by staff  Follow up required by staff  Follow up required by staff  -      Initial Review   Source of Stress Concerns  -  -  -  Unable to participate in former interests or hobbies        Psychosocial Discharge (Final Psychosocial Re-Evaluation): Psychosocial Re-Evaluation - 06/25/18 1035      Psychosocial Re-Evaluation   Current issues with  Current Depression;Current Sleep Concerns;Current Stress Concerns    Comments  Pt feels he has depression because he is unable to drive to get out and do for himself.  He must depend on others to get him where he needs to go.  Pt is hopeful that participating in pulmonary rehab will get him out of the house more.  Pt arrives late and leaves early.  Pt with general malaise dalily.    Expected Outcomes  patient will remain free from psychosocial barriers to participation in pulmonary rehab    Interventions  Encouraged to attend Pulmonary Rehabilitation for the exercise;Relaxation education;Stress management education      Initial Review   Source of Stress Concerns  Unable to participate in former interests or hobbies       Education: Education Goals: Education classes will be provided on a weekly basis, covering required topics. Participant will state understanding/return demonstration of topics presented.  Learning Barriers/Preferences: Learning Barriers/Preferences - 04/06/18 1040      Learning Barriers/Preferences   Learning Barriers  Sight       Education Topics: Risk Factor Reduction:  -Group instruction that is supported by a PowerPoint presentation. Instructor discusses the definition of a risk factor, different risk factors for pulmonary disease, and how the heart and lungs work together.     Nutrition for Pulmonary Patient:  -Group instruction provided by PowerPoint slides, verbal discussion, and written materials to support subject matter. The instructor gives an explanation and review of healthy diet recommendations, which includes a discussion on weight management, recommendations for fruit and vegetable consumption, as well as protein, fluid, caffeine, fiber, sodium, sugar, and alcohol. Tips for eating when  patients are short  of breath are discussed.   PULMONARY REHAB OTHER RESPIRATORY from 06/11/2018 in [place]  Date  05/07/18  Educator  Rodman Pickle  Instruction Review Code  2- Demonstrated Understanding      Pursed Lip Breathing:  -Group instruction that is supported by demonstration and informational handouts. Instructor discusses the benefits of pursed lip and diaphragmatic breathing and detailed demonstration on how to preform both.     Oxygen Safety:  -Group instruction provided by PowerPoint, verbal discussion, and written material to support subject matter. There is an overview of "What is Oxygen" and "Why do we need it".  Instructor also reviews how to create a safe environment for oxygen use, the importance of using oxygen as prescribed, and the risks of noncompliance. There is a brief discussion on traveling with oxygen and resources the patient may utilize.   PULMONARY REHAB OTHER RESPIRATORY from 06/11/2018 in [place]  Date  06/11/18  Educator  Cloyde Reams  Instruction Review Code  2- Demonstrated Understanding      Oxygen Equipment:  -Group instruction provided by [facility] Staff utilizing handouts, written materials, and equipment demonstrations.   Signs and Symptoms:  -Group instruction provided by written material and verbal discussion to support subject matter. Warning signs and symptoms of infection, stroke, and heart attack are reviewed and when to call the physician/911 reinforced. Tips for preventing the spread of infection discussed.   PULMONARY REHAB OTHER RESPIRATORY from 06/11/2018 in [place]  Date  06/04/18  Educator  rn  Instruction Review Code  2- Demonstrated Understanding      Advanced Directives:  -Group instruction provided by verbal instruction and written material to support subject matter. Instructor reviews Advanced Directive laws and proper instruction for  filling out document.   Pulmonary Video:  -Group video education that reviews the importance of medication and oxygen compliance, exercise, good nutrition, pulmonary hygiene, and pursed lip and diaphragmatic breathing for the pulmonary patient.   Exercise for the Pulmonary Patient:  -Group instruction that is supported by a PowerPoint presentation. Instructor discusses benefits of exercise, core components of exercise, frequency, duration, and intensity of an exercise routine, importance of utilizing pulse oximetry during exercise, safety while exercising, and options of places to exercise outside of rehab.     Pulmonary Medications:  -Verbally interactive group education provided by instructor with focus on inhaled medications and proper administration.   PULMONARY REHAB OTHER RESPIRATORY from 06/11/2018 in [place]  Date  05/19/18  Educator  pharm  Instruction Review Code  2- Demonstrated Understanding      Anatomy and Physiology of the Respiratory System and Intimacy:  -Group instruction provided by PowerPoint, verbal discussion, and written material to support subject matter. Instructor reviews respiratory cycle and anatomical components of the respiratory system and their functions. Instructor also reviews differences in obstructive and restrictive respiratory diseases with examples of each. Intimacy, Sex, and Sexuality differences are reviewed with a discussion on how relationships can change when diagnosed with pulmonary disease. Common sexual concerns are reviewed.   PULMONARY REHAB OTHER RESPIRATORY from 06/11/2018 in [place]  Date  04/30/18  Educator  RN  Instruction Review Code  2- Demonstrated Understanding      MD DAY -A group question and answer session with a medical doctor that allows participants to ask questions that relate to their pulmonary disease state.   PULMONARY REHAB OTHER RESPIRATORY from  06/11/2018 in [place]  Date  04/14/18  Educator  Dr. Nelda Marseille  Instruction Review Code  1- Verbalizes Understanding      OTHER EDUCATION -Group or individual verbal, written, or video instructions that support the educational goals of the pulmonary rehab program.   PULMONARY REHAB OTHER RESPIRATORY from 06/11/2018 in [place]  Date  05/28/18  Educator  Cloyde Reams  Instruction Review Code  1- Verbalizes Understanding [Sedentary Lifestyle]      Holiday Eating Survival Tips:  -Group instruction provided by [employer], verbal discussion, and written materials to support subject matter. The instructor gives patients tips, tricks, and techniques to help them not only survive but enjoy the holidays despite the onslaught of food that accompanies the holidays.   Knowledge Questionnaire Score:   Core Components/Risk Factors/Patient Goals at Admission: Personal Goals and Risk Factors at Admission - 04/06/18 1140      Core Components/Risk Factors/Patient Goals on Admission   Expected Outcomes  --   Pt feels stressed about his inability to do  activities that he finds pleasurable      Core Components/Risk Factors/Patient Goals Review:  Goals and Risk Factor Review    Row Name 04/29/18 1034 04/29/18 1035 05/27/18 1703 06/25/18 1035       Core Components/Risk Factors/Patient Goals Review   Personal Goals Review  Weight Management/Obesity;Improve shortness of breath with ADL's;Increase knowledge of respiratory medications and ability to use respiratory devices properly.;Develop more efficient breathing techniques such as purse lipped breathing and diaphragmatic breathing and practicing self-pacing with activity.  Weight Management/Obesity;Improve shortness of breath with ADL's;Increase knowledge of respiratory medications and ability to use respiratory devices properly.;Develop more efficient breathing techniques such as purse  lipped breathing and diaphragmatic breathing and practicing self-pacing with activity.;Stress  Weight Management/Obesity;Improve shortness of breath with ADL's;Increase knowledge of respiratory medications and ability to use respiratory devices properly.;Develop more efficient breathing techniques such as purse lipped breathing and diaphragmatic breathing and practicing self-pacing with activity.;Stress  Weight Management/Obesity;Improve shortness of breath with ADL's;Increase knowledge of respiratory medications and ability to use respiratory devices properly.;Develop more efficient breathing techniques such as purse lipped breathing and diaphragmatic breathing and practicing self-pacing with activity.;Stress    Review  -  Pt has completed 3 exercise sessions since 8/6.  Pt returns to pulmonary rehab.  Pt with fair attendance and often has to miss due to transportation.  Pt depends upon his wife who is also still working for rides to rehab.  Pt aware on proper breathing techniques, needs some reminders from time to time this is stressful for pt.  Pt workloads show slight increase from first day.  airdyne increaed to level 1.2, nustep level 4 and laps around the track 26-27 laps.  I anticipate pt will show measurable progress toward pulmonary rehab goals in the the next 30 day assessment  Pt has completed 10 exercise sessions since 8/6.  Pt returns to pulmonary rehab.  Pt with fair attendance and often has to miss due to transportation.  Pt depends upon his wife who is also still working for rides to rehab.  Pt aware on proper breathing techniques, needs some reminders from time to time this is stressful for pt.  Pt enjoys walking when his hips are not a bother to him.  Pt will sometimes ambulate the entire exercise time. Pt with increase of the nustep to level 5 I anticipate pt will show measurable progress toward pulmonary rehab goals in the the  next 30 day assessment  Pt has completed 16 exercise sessions and 7  education classes since 8/6.  Pt returns to pulmonary rehab.  Pt with fair attendance and often has to miss due to transportation.  Pt aware on proper breathing techniques, more independent at times.  Pt enjoys walking when his hips are not a bother to him.  Pt will sometimes ambulate the entire exercise time. Pt with increase of the nustep to level 5.  Pt is able to self modify his exercise routine depending upon how he feels.  This will be important for home exercise on his own.  I anticipate pt will show measurable progress toward pulmonary rehab goals in the the next 30 day assessment    Expected Outcomes  -  See Admission Goals/Outcomes  See Admission Goals/Outcomes  See Admission Goals/Outcomes       Core Components/Risk Factors/Patient Goals at Discharge (Final Review):  Goals and Risk Factor Review - 06/25/18 1035      Core Components/Risk Factors/Patient Goals Review   Personal Goals Review  Weight Management/Obesity;Improve shortness of breath with ADL's;Increase knowledge of respiratory medications and ability to use respiratory devices properly.;Develop more efficient breathing techniques such as purse lipped breathing and diaphragmatic breathing and practicing self-pacing with activity.;Stress    Review  Pt has completed 16 exercise sessions and 7 education classes since 8/6.  Pt returns to pulmonary rehab.  Pt with fair attendance and often has to miss due to transportation.  Pt aware on proper breathing techniques, more independent at times.  Pt enjoys walking when his hips are not a bother to him.  Pt will sometimes ambulate the entire exercise time. Pt with increase of the nustep to level 5.  Pt is able to self modify his exercise routine depending upon how he feels.  This will be important for home exercise on his own.  I anticipate pt will show measurable progress toward pulmonary rehab goals in the the next 30 day assessment    Expected Outcomes  See Admission Goals/Outcomes        ITP Comments: ITP Comments    Row Name 04/06/18 1015 04/29/18 1034 05/27/18 1701 06/25/18 1035     ITP Comments  Dr. Jennet Maduro,  Medical Director  Dr. Jennet Maduro,  Medical Director  Dr. Jennet Maduro,  Medical Director Pulmonary Rehab  Dr. Jennet Maduro,  Medical Director Pulmonary Rehab       Comments: Pt has completed 16 exercise sessions. Cherre Huger, BSN Cardiac and Training and development officer

## 2018-06-25 NOTE — Progress Notes (Signed)
Daily Session Note  Patient Details  Name: Travis Salinas MRN: [id number] Date of Birth: 04/29/1951 Referring Provider:     Pulmonary Rehab Walk Test from 04/07/2018 in [place]  Referring Provider  Dr. Chase Caller      Encounter Date: 06/25/2018  Check In:   Capillary Blood Glucose: No results found for this or any previous visit (from the past 24 hour(s)).    Social History   Tobacco Use  Smoking Status Former Smoker  . Packs/day: 1.00  . Years: 32.00  . Pack years: 32.00  . Types: Cigarettes  . Last attempt to quit: 2002  . Years since quitting: 17.8  Smokeless Tobacco Never Used    Goals Met:  Exercise tolerated well No report of cardiac concerns or symptoms Strength training completed today  Goals Unmet:  Not Applicable  Comments: Service time is from 1330 to 1505    Dr. Rush Farmer is Medical Director for Pulmonary Rehab at [facility].

## 2018-06-30 ENCOUNTER — Encounter (HOSPITAL_COMMUNITY): Payer: 59

## 2018-06-30 ENCOUNTER — Encounter (HOSPITAL_COMMUNITY)
Admission: RE | Admit: 2018-06-30 | Discharge: 2018-06-30 | Disposition: A | Discharge status: Home / Self Care | Payer: 59 | Source: Ambulatory Visit | Attending: Internal Medicine | Admitting: Internal Medicine

## 2018-06-30 DIAGNOSIS — J438 Other emphysema: Secondary | ICD-10-CM

## 2018-06-30 DIAGNOSIS — Z791 Long term (current) use of non-steroidal anti-inflammatories (NSAID): Secondary | ICD-10-CM | POA: Diagnosis not present

## 2018-06-30 DIAGNOSIS — Z79899 Other long term (current) drug therapy: Secondary | ICD-10-CM | POA: Diagnosis not present

## 2018-06-30 DIAGNOSIS — Z87891 Personal history of nicotine dependence: Secondary | ICD-10-CM | POA: Diagnosis not present

## 2018-06-30 DIAGNOSIS — I10 Essential (primary) hypertension: Secondary | ICD-10-CM | POA: Diagnosis not present

## 2018-06-30 NOTE — Progress Notes (Signed)
Daily Session Note  Patient Details  Name: Travis Salinas MRN: [id number] Date of Birth: 1950-12-31 Referring Provider:     Pulmonary Rehab Walk Test from 04/07/2018 in [place]  Referring Provider  Dr. Chase Caller      Encounter Date: 06/30/2018  Check In: Session Check In - 06/30/18 1330      Check-In   Supervising physician immediately available to respond to emergencies  [facility] immediately available    Physician(s)  Dr. Ree Kida    Location  [facility]    Staff Present  Maurice Small, RN, BSN;Molly DiVincenzo, MS, ACSM RCEP, Exercise Physiologist;Annedrea Rosezella Florida, RN, MHA;Lisa Ysidro Evert, RN;Dalton Fletcher, MS, Exercise Physiologist    Medication changes reported      No    Fall or balance concerns reported     No    Tobacco Cessation  No Change    Warm-up and Cool-down  Performed as group-led instruction    Resistance Training Performed  Yes    VAD Patient?  No    PAD/SET Patient?  No      Pain Assessment   Currently in Pain?  No/denies    Multiple Pain Sites  No       Capillary Blood Glucose: No results found for this or any previous visit (from the past 24 hour(s)).    Social History   Tobacco Use  Smoking Status Former Smoker  . Packs/day: 1.00  . Years: 32.00  . Pack years: 32.00  . Types: Cigarettes  . Last attempt to quit: 2002  . Years since quitting: 17.8  Smokeless Tobacco Never Used    Goals Met:  Proper associated with RPD/PD & O2 Sat Exercise tolerated well  Goals Unmet:  Not Applicable  Comments: Service time is from 1330 to 1500    Dr. Rush Farmer is Medical Director for Pulmonary Rehab at [facility].

## 2018-07-02 ENCOUNTER — Encounter (HOSPITAL_COMMUNITY): Payer: 59

## 2018-07-07 ENCOUNTER — Encounter (HOSPITAL_COMMUNITY): Payer: 59

## 2018-07-07 ENCOUNTER — Encounter (HOSPITAL_COMMUNITY)
Admission: RE | Admit: 2018-07-07 | Discharge: 2018-07-07 | Disposition: A | Discharge status: Home / Self Care | Payer: 59 | Source: Ambulatory Visit | Attending: Internal Medicine | Admitting: Internal Medicine

## 2018-07-07 VITALS — Wt 154.5 lb

## 2018-07-07 DIAGNOSIS — Z87891 Personal history of nicotine dependence: Secondary | ICD-10-CM | POA: Diagnosis not present

## 2018-07-07 DIAGNOSIS — Z791 Long term (current) use of non-steroidal anti-inflammatories (NSAID): Secondary | ICD-10-CM | POA: Diagnosis not present

## 2018-07-07 DIAGNOSIS — I10 Essential (primary) hypertension: Secondary | ICD-10-CM | POA: Diagnosis not present

## 2018-07-07 DIAGNOSIS — J438 Other emphysema: Secondary | ICD-10-CM | POA: Diagnosis not present

## 2018-07-07 DIAGNOSIS — Z79899 Other long term (current) drug therapy: Secondary | ICD-10-CM | POA: Diagnosis not present

## 2018-07-07 NOTE — Progress Notes (Signed)
Daily Session Note  Patient Details  Name: Travis Salinas MRN: [id number] Date of Birth: 10-12-1950 Referring Provider:     Pulmonary Rehab Walk Test from 04/07/2018 in [place]  Referring Provider  Dr. Chase Caller      Encounter Date: 07/07/2018  Check In: Session Check In - 07/07/18 1330      Check-In   Supervising physician immediately available to respond to emergencies  [facility] immediately available    Physician(s)  Dr. Florene Glen    Location  [facility]    Staff Present  Rosebud Poles, RN, BSN;Carlette Wilber Oliphant, RN, BSN;Molly DiVincenzo, MS, ACSM RCEP, Exercise Physiologist;Dalton Kris Mouton, MS, Exercise Physiologist;Lisa Colletta Maryland, RN, MHA    Medication changes reported      No    Fall or balance concerns reported     No    Tobacco Cessation  No Change    Warm-up and Cool-down  Performed as group-led instruction    Resistance Training Performed  Yes    VAD Patient?  No    PAD/SET Patient?  No      Pain Assessment   Currently in Pain?  No/denies    Multiple Pain Sites  No       Capillary Blood Glucose: No results found for this or any previous visit (from the past 24 hour(s)).  Exercise Prescription Changes - 07/07/18 1500      Response to Exercise   Blood Pressure (Admit)  140/76    Blood Pressure (Exercise)  132/70    Blood Pressure (Exit)  148/62    Heart Rate (Admit)  60 bpm    Heart Rate (Exercise)  78 bpm    Heart Rate (Exit)  78 bpm    Oxygen Saturation (Admit)  96 %    Oxygen Saturation (Exercise)  96 %    Oxygen Saturation (Exit)  94 %    Rating of Perceived Exertion (Exercise)  13    Perceived Dyspnea (Exercise)  1    Duration  Progress to 45 minutes of aerobic exercise without signs/symptoms of physical distress    Intensity  THRR unchanged      Progression   Progression  Continue to progress workloads to maintain intensity without signs/symptoms of physical distress.       Resistance Training   Training Prescription  Yes    Weight  blue bands    Reps  10-15    Time  10 Minutes      NuStep   Level  5    SPM  80    Minutes  34    METs  1.8      Track   Laps  16    Minutes  17       Social History   Tobacco Use  Smoking Status Former Smoker  . Packs/day: 1.00  . Years: 32.00  . Pack years: 32.00  . Types: Cigarettes  . Last attempt to quit: 2002  . Years since quitting: 17.8  Smokeless Tobacco Never Used    Goals Met:  Proper associated with RPD/PD & O2 Sat Exercise tolerated well  Goals Unmet:  Not Applicable  Comments: Service time is from 1330 to 1500    Dr. Rush Farmer is Medical Director for Pulmonary Rehab at [facility].

## 2018-07-08 ENCOUNTER — Other Ambulatory Visit: Payer: Self-pay | Admitting: Internal Medicine

## 2018-07-09 ENCOUNTER — Encounter (HOSPITAL_COMMUNITY)
Admission: RE | Admit: 2018-07-09 | Discharge: 2018-07-09 | Disposition: A | Discharge status: Home / Self Care | Payer: 59 | Source: Ambulatory Visit | Attending: Internal Medicine | Admitting: Internal Medicine

## 2018-07-09 ENCOUNTER — Encounter (HOSPITAL_COMMUNITY): Payer: 59

## 2018-07-09 DIAGNOSIS — I10 Essential (primary) hypertension: Secondary | ICD-10-CM | POA: Diagnosis not present

## 2018-07-09 DIAGNOSIS — Z79899 Other long term (current) drug therapy: Secondary | ICD-10-CM | POA: Diagnosis not present

## 2018-07-09 DIAGNOSIS — F3341 Major depressive disorder, recurrent, in partial remission: Secondary | ICD-10-CM | POA: Diagnosis not present

## 2018-07-09 DIAGNOSIS — Z87891 Personal history of nicotine dependence: Secondary | ICD-10-CM | POA: Diagnosis not present

## 2018-07-09 DIAGNOSIS — G2111 Neuroleptic induced parkinsonism: Secondary | ICD-10-CM | POA: Diagnosis not present

## 2018-07-09 DIAGNOSIS — F411 Generalized anxiety disorder: Secondary | ICD-10-CM | POA: Diagnosis not present

## 2018-07-09 DIAGNOSIS — J438 Other emphysema: Secondary | ICD-10-CM | POA: Diagnosis not present

## 2018-07-09 DIAGNOSIS — Z791 Long term (current) use of non-steroidal anti-inflammatories (NSAID): Secondary | ICD-10-CM | POA: Diagnosis not present

## 2018-07-09 DIAGNOSIS — F341 Dysthymic disorder: Secondary | ICD-10-CM | POA: Diagnosis not present

## 2018-07-09 NOTE — Progress Notes (Signed)
Daily Session Note  Patient Details  Name: Travis Salinas MRN: [id number] Date of Birth: 05-30-1951 Referring Provider:     Pulmonary Rehab Walk Test from 04/07/2018 in [place]  Referring Provider  Dr. Chase Caller      Encounter Date: 07/09/2018  Check In: Session Check In - 07/09/18 1330      Check-In   Supervising physician immediately available to respond to emergencies  [facility] immediately available    Physician(s)  Dr. Maryland Pink    Location  [facility]    Staff Present  Rosebud Poles, RN, BSN;Carlette Wilber Oliphant, RN, BSN;Molly DiVincenzo, MS, ACSM RCEP, Exercise Physiologist;Dalton Kris Mouton, MS, Exercise Physiologist;Annedrea Rosezella Florida, RN, Ramonita Lab, RN    Medication changes reported      No    Fall or balance concerns reported     No    Tobacco Cessation  No Change    Warm-up and Cool-down  Performed as group-led instruction    Resistance Training Performed  Yes    VAD Patient?  No    PAD/SET Patient?  No      Pain Assessment   Currently in Pain?  No/denies    Multiple Pain Sites  No       Capillary Blood Glucose: No results found for this or any previous visit (from the past 24 hour(s)).    Social History   Tobacco Use  Smoking Status Former Smoker  . Packs/day: 1.00  . Years: 32.00  . Pack years: 32.00  . Types: Cigarettes  . Last attempt to quit: 2002  . Years since quitting: 17.8  Smokeless Tobacco Never Used    Goals Met:  Proper associated with RPD/PD & O2 Sat Exercise tolerated well  Goals Unmet:  Not Applicable  Comments: Service time is from 1330 to 1520    Dr. Rush Farmer is Medical Director for Pulmonary Rehab at [facility].

## 2018-07-13 DIAGNOSIS — R2681 Unsteadiness on feet: Secondary | ICD-10-CM | POA: Diagnosis not present

## 2018-07-13 DIAGNOSIS — L0390 Cellulitis, unspecified: Secondary | ICD-10-CM | POA: Diagnosis not present

## 2018-07-13 DIAGNOSIS — L0291 Cutaneous abscess, unspecified: Secondary | ICD-10-CM | POA: Diagnosis not present

## 2018-07-13 DIAGNOSIS — J189 Pneumonia, unspecified organism: Secondary | ICD-10-CM | POA: Diagnosis not present

## 2018-07-13 DIAGNOSIS — J449 Chronic obstructive pulmonary disease, unspecified: Secondary | ICD-10-CM | POA: Diagnosis not present

## 2018-07-13 DIAGNOSIS — I5030 Unspecified diastolic (congestive) heart failure: Secondary | ICD-10-CM | POA: Diagnosis not present

## 2018-07-14 ENCOUNTER — Encounter (HOSPITAL_COMMUNITY)
Admission: RE | Admit: 2018-07-14 | Discharge: 2018-07-14 | Disposition: A | Discharge status: Home / Self Care | Payer: 59 | Source: Ambulatory Visit | Attending: Internal Medicine | Admitting: Internal Medicine

## 2018-07-14 DIAGNOSIS — J438 Other emphysema: Secondary | ICD-10-CM | POA: Insufficient documentation

## 2018-07-14 DIAGNOSIS — Z79899 Other long term (current) drug therapy: Secondary | ICD-10-CM | POA: Diagnosis not present

## 2018-07-14 DIAGNOSIS — Z87891 Personal history of nicotine dependence: Secondary | ICD-10-CM | POA: Insufficient documentation

## 2018-07-14 DIAGNOSIS — Z791 Long term (current) use of non-steroidal anti-inflammatories (NSAID): Secondary | ICD-10-CM | POA: Diagnosis not present

## 2018-07-14 DIAGNOSIS — I10 Essential (primary) hypertension: Secondary | ICD-10-CM | POA: Diagnosis not present

## 2018-07-14 DIAGNOSIS — M170 Bilateral primary osteoarthritis of knee: Secondary | ICD-10-CM | POA: Diagnosis not present

## 2018-07-14 NOTE — Progress Notes (Signed)
Daily Session Note  Patient Details  Name: Travis Salinas MRN: [id number] Date of Birth: 11-Jun-1951 Referring Provider:     Pulmonary Rehab Walk Test from 04/07/2018 in [place]  Referring Provider  Dr. Chase Caller      Encounter Date: 07/14/2018  Check In: Session Check In - 07/14/18 1330      Check-In   Supervising physician immediately available to respond to emergencies  [facility] immediately available    Physician(s)  Dr. Maryland Pink    Location  [facility]    Staff Present  Rosebud Poles, RN, BSN;Carlette Wilber Oliphant, RN, BSN;Lisa Ysidro Evert, RN;Dalton Kris Mouton, MS, Exercise Physiologist;Annedrea Rosezella Florida, RN, MHA    Medication changes reported      No    Fall or balance concerns reported     No    Tobacco Cessation  No Change    Warm-up and Cool-down  Performed as group-led instruction    Resistance Training Performed  Yes    VAD Patient?  No    PAD/SET Patient?  No      Pain Assessment   Currently in Pain?  No/denies    Multiple Pain Sites  No       Capillary Blood Glucose: No results found for this or any previous visit (from the past 24 hour(s)).    Social History   Tobacco Use  Smoking Status Former Smoker  . Packs/day: 1.00  . Years: 32.00  . Pack years: 32.00  . Types: Cigarettes  . Last attempt to quit: 2002  . Years since quitting: 17.8  Smokeless Tobacco Never Used    Goals Met:  Proper associated with RPD/PD & O2 Sat Exercise tolerated well  Goals Unmet:  Not Applicable  Comments: Service time is from 1330 to 1500    Dr. Rush Farmer is Medical Director for Pulmonary Rehab at [facility].

## 2018-07-16 ENCOUNTER — Encounter (HOSPITAL_COMMUNITY)
Admission: RE | Admit: 2018-07-16 | Discharge: 2018-07-16 | Disposition: A | Discharge status: Home / Self Care | Payer: 59 | Source: Ambulatory Visit | Attending: Internal Medicine | Admitting: Internal Medicine

## 2018-07-16 VITALS — Wt 147.9 lb

## 2018-07-16 DIAGNOSIS — J438 Other emphysema: Secondary | ICD-10-CM

## 2018-07-16 DIAGNOSIS — Z79899 Other long term (current) drug therapy: Secondary | ICD-10-CM | POA: Diagnosis not present

## 2018-07-16 DIAGNOSIS — Z87891 Personal history of nicotine dependence: Secondary | ICD-10-CM | POA: Diagnosis not present

## 2018-07-16 DIAGNOSIS — Z791 Long term (current) use of non-steroidal anti-inflammatories (NSAID): Secondary | ICD-10-CM | POA: Diagnosis not present

## 2018-07-16 DIAGNOSIS — I10 Essential (primary) hypertension: Secondary | ICD-10-CM | POA: Diagnosis not present

## 2018-07-16 NOTE — Progress Notes (Signed)
Daily Session Note  Patient Details  Name: Travis Salinas MRN: [id number] Date of Birth: 25-Jan-1951 Referring Provider:     Pulmonary Rehab Walk Test from 04/07/2018 in [place]  Referring Provider  Dr. Chase Caller      Encounter Date: 07/16/2018  Check In: Session Check In - 07/16/18 1330      Check-In   Supervising physician immediately available to respond to emergencies  [facility] immediately available    Physician(s)  Dr. Starla Link    Location  [facility]    Staff Present  Rosebud Poles, RN, BSN;Carlette Wilber Oliphant, RN, Bjorn Loser, MS, Exercise Physiologist;Annedrea Rosezella [place], RN, MHA    Medication changes reported      No    Fall or balance concerns reported     No    Tobacco Cessation  No Change    Warm-up and Cool-down  Performed as group-led instruction    Resistance Training Performed  Yes    VAD Patient?  No    PAD/SET Patient?  No      Pain Assessment   Currently in Pain?  No/denies    Multiple Pain Sites  No       Capillary Blood Glucose: No results found for this or any previous visit (from the past 24 hour(s)).    Social History   Tobacco Use  Smoking Status Former Smoker  . Packs/day: 1.00  . Years: 32.00  . Pack years: 32.00  . Types: Cigarettes  . Last attempt to quit: 2002  . Years since quitting: 17.8  Smokeless Tobacco Never Used    Goals Met:  Proper associated with RPD/PD & O2 Sat Exercise tolerated well  Goals Unmet:  Not Applicable  Comments: Service time is from 1330 to 1530   Dr. Rush Farmer is Medical Director for Pulmonary Rehab at [facility].

## 2018-07-17 DIAGNOSIS — J189 Pneumonia, unspecified organism: Secondary | ICD-10-CM | POA: Diagnosis not present

## 2018-07-17 DIAGNOSIS — R2681 Unsteadiness on feet: Secondary | ICD-10-CM | POA: Diagnosis not present

## 2018-07-17 DIAGNOSIS — L0291 Cutaneous abscess, unspecified: Secondary | ICD-10-CM | POA: Diagnosis not present

## 2018-07-17 DIAGNOSIS — J449 Chronic obstructive pulmonary disease, unspecified: Secondary | ICD-10-CM | POA: Diagnosis not present

## 2018-07-17 DIAGNOSIS — I5030 Unspecified diastolic (congestive) heart failure: Secondary | ICD-10-CM | POA: Diagnosis not present

## 2018-07-17 DIAGNOSIS — L0390 Cellulitis, unspecified: Secondary | ICD-10-CM | POA: Diagnosis not present

## 2018-07-21 ENCOUNTER — Encounter (HOSPITAL_COMMUNITY)
Admission: RE | Admit: 2018-07-21 | Discharge: 2018-07-21 | Disposition: A | Discharge status: Home / Self Care | Payer: 59 | Source: Ambulatory Visit | Attending: Internal Medicine | Admitting: Internal Medicine

## 2018-07-21 DIAGNOSIS — Z87891 Personal history of nicotine dependence: Secondary | ICD-10-CM | POA: Diagnosis not present

## 2018-07-21 DIAGNOSIS — J438 Other emphysema: Secondary | ICD-10-CM | POA: Diagnosis not present

## 2018-07-21 DIAGNOSIS — I10 Essential (primary) hypertension: Secondary | ICD-10-CM | POA: Diagnosis not present

## 2018-07-21 DIAGNOSIS — Z791 Long term (current) use of non-steroidal anti-inflammatories (NSAID): Secondary | ICD-10-CM | POA: Diagnosis not present

## 2018-07-21 DIAGNOSIS — Z79899 Other long term (current) drug therapy: Secondary | ICD-10-CM | POA: Diagnosis not present

## 2018-07-22 NOTE — Progress Notes (Signed)
Pulmonary Individual Treatment Plan  Patient Details  Name: Travis Salinas MRN: [id number] Date of Birth: 1951/08/03 Referring Provider:     Pulmonary Rehab Walk Test from 04/07/2018 in [facility]  Referring Provider  Dr. Chase Caller      Initial Encounter Date:    Pulmonary Rehab Walk Test from 04/07/2018 in [place]  Date  04/07/18      Visit Diagnosis: Other emphysema ([place])  Patient's Home Medications on Admission:   Current Outpatient Medications:  .  acetaminophen (TYLENOL) 325 MG tablet, Take 2 tablets (650 mg total) by mouth every 6 (six) hours as needed for mild pain (or Fever >/= 101)., Disp: , Rfl:  .  albuterol (PROVENTIL) (2.5 MG/3ML) 0.083% nebulizer solution, Take 3 mLs (2.5 mg total) by nebulization every 2 (two) hours as needed for wheezing or shortness of breath., Disp: 75 mL, Rfl: 12 .  ARIPiprazole (ABILIFY) 15 MG tablet, 10 mg. 7/29 dosage decreased to 10 mg, Disp: , Rfl: 3 .  atenolol (TENORMIN) 25 MG tablet, Take 1 tablet (25 mg total) by mouth every morning., Disp: , Rfl:  .  clonazePAM (KLONOPIN) 0.5 MG tablet, Take 1 tablet (0.5 mg total) by mouth 2 (two) times daily. (Patient taking differently: Take 1 mg by mouth 2 (two) times daily. ), Disp: 30 tablet, Rfl: 0 .  feeding supplement, ENSURE ENLIVE, (ENSURE ENLIVE) LIQD, Take 237 mLs by mouth 2 (two) times daily between meals. (Patient not taking: Reported on 04/06/2018), Disp: 237 mL, Rfl: 12 .  FLUoxetine (PROZAC) 20 MG capsule, Take 1 capsule (20 mg total) by mouth daily. (Patient taking differently: Take 10 mg by mouth daily. ), Disp: , Rfl: 3 .  fluticasone (FLONASE) 50 MCG/ACT nasal spray, Place 1 spray into both nostrils every morning., Disp: , Rfl: 0 .  ibuprofen (ADVIL,MOTRIN) 600 MG tablet, Take 1 tablet (600 mg total) by mouth 4 (four) times daily., Disp: 30 tablet, Rfl: 0 .  ipratropium-albuterol (DUONEB) 0.5-2.5 (3) MG/3ML SOLN, Take 3  mLs by nebulization 3 (three) times daily. (Patient taking differently: Take 3 mLs by nebulization 3 (three) times daily as needed. ), Disp: 360 mL, Rfl:  .  lamoTRIgine (LAMICTAL) 200 MG tablet, Take 1 tablet (200 mg total) by mouth every morning., Disp: , Rfl:  .  silodosin (RAPAFLO) 8 MG CAPS capsule, Take 8 mg daily with breakfast by mouth., Disp: , Rfl:  .  SPIRIVA RESPIMAT 2.5 MCG/ACT AERS, INHALE 2 PUFFS INTO THE LUNGS DAILY., Disp: 4 g, Rfl: 5  Past Medical History: Past Medical History:  Diagnosis Date  . Arthritis   . Cervical disc herniation   . COPD (chronic obstructive pulmonary disease) ([place])   . Headache    " due to antibiotics"  . Hepatitis C   . Hypertension     Tobacco Use: Social History   Tobacco Use  Smoking Status Former Smoker  . Packs/day: 1.00  . Years: 32.00  . Pack years: 32.00  . Types: Cigarettes  . Last attempt to quit: 2002  . Years since quitting: 17.8  Smokeless Tobacco Never Used    Labs: Recent Chemical engineer    Labs for ITP Cardiac and Pulmonary Rehab Latest Ref Rng & Units 05/27/2017 05/27/2017 05/28/2017 05/31/2017 06/14/2017   Hemoglobin A1c 4.8 - 5.6 % - - - - 5.9(H)   PHART 7.350 - 7.450 7.461(H) 7.442 7.408 7.467(H) -   PCO2ART 32.0 - 48.0 mmHg 28.2(L) 29.2(L)  36.4 35.3 -   HCO3 20.0 - 28.0 mmol/L 19.8(L) 19.6(L) 22.6 25.1 -   TCO2 0 - 100 mmol/L - - - - -   ACIDBASEDEF 0.0 - 2.0 mmol/L 2.6(H) 3.2(H) 1.3 - -   O2SAT % 89.1 82.5 89.1 98.7 -      Capillary Blood Glucose: No results found for: GLUCAP   Pulmonary Assessment Scores: Pulmonary Assessment Scores    Row Name 04/07/18 1300         ADL UCSD   ADL Phase  Entry       mMRC Score   mMRC Score  0        Pulmonary Function Assessment:   Exercise Target Goals: Exercise Program Goal: Individual exercise prescription set using results from initial 6 min walk test and THRR while considering  patient's activity barriers and safety.   Exercise Prescription  Goal: Initial exercise prescription builds to 30-45 minutes a day of aerobic activity, 2-3 days per week.  Home exercise guidelines will be given to patient during program as part of exercise prescription that the participant will acknowledge.  Activity Barriers & Risk Stratification: Activity Barriers & Cardiac Risk Stratification - 04/06/18 1146      Activity Barriers & Cardiac Risk Stratification   Activity Barriers  Joint Problems;Deconditioning;History of Falls;Neck/Spine Problems;Shortness of Breath    Cardiac Risk Stratification  High       6 Minute Walk: 6 Minute Walk    Row Name 04/07/18 1255 07/21/18 1629       6 Minute Walk   Phase  Discharge  Discharge    Distance  1310 feet  1610 feet    Distance Feet Change  -  300 ft    Walk Time  6 minutes  6 minutes    # of Rest Breaks  0  0    MPH  2.48  3.04    METS  2.91  3.3    RPE  11  11    Perceived Dyspnea   1  1    Symptoms  No  Yes (comment)    Comments  -  hip/knee weakness from arthritis     Resting HR  75 bpm  63 bpm    Resting BP  114/60  120/60    Resting Oxygen Saturation   97 %  98 %    Exercise Oxygen Saturation  during 6 min walk  93 %  95 %    Max Ex. HR  100 bpm  110 bpm    Max Ex. BP  130/60  130/60    2 Minute Post BP  -  114/58      Interval HR   1 Minute HR  90  98    2 Minute HR  100  107    3 Minute HR  97  102    4 Minute HR  100  106    5 Minute HR  96  110    6 Minute HR  101  109    2 Minute Post HR  88  83    Interval Heart Rate?  Yes  Yes      Interval Oxygen   Interval Oxygen?  Yes  Yes    Baseline Oxygen Saturation %  97 %  98 %    1 Minute Oxygen Saturation %  96 %  96 %    1 Minute Liters of Oxygen  0 L  0 L    2  Minute Oxygen Saturation %  93 %  95 %    2 Minute Liters of Oxygen  0 L  0 L    3 Minute Oxygen Saturation %  94 %  95 %    3 Minute Liters of Oxygen  0 L  0 L    4 Minute Oxygen Saturation %  94 %  96 %    4 Minute Liters of Oxygen  0 L  0 L    5 Minute Oxygen  Saturation %  95 %  95 %    5 Minute Liters of Oxygen  0 L  0 L    6 Minute Oxygen Saturation %  95 %  95 %    6 Minute Liters of Oxygen  0 L  0 L    2 Minute Post Oxygen Saturation %  94 %  97 %    2 Minute Post Liters of Oxygen  0 L  0 L       Oxygen Initial Assessment: Oxygen Initial Assessment - 04/07/18 1255      Initial 6 min Walk   Oxygen Used  None      Program Oxygen Prescription   Program Oxygen Prescription  None       Oxygen Re-Evaluation: Oxygen Re-Evaluation    Row Name 04/28/18 0734 05/26/18 0938 06/25/18 0806         Program Oxygen Prescription   Program Oxygen Prescription  None  None  None       Home Oxygen   Home Oxygen Device  Portable Concentrator  Portable Concentrator  Portable Concentrator     Sleep Oxygen Prescription  Continuous  Continuous  Continuous     Liters per minute  2  2  2      Home Exercise Oxygen Prescription  None  None  None     Home at Rest Exercise Oxygen Prescription  None  None  None     Compliance with Home Oxygen Use  Yes  Yes  Yes       Goals/Expected Outcomes   Short Term Goals  -  To learn and exhibit compliance with exercise, home and travel O2 prescription;To learn and understand importance of monitoring SPO2 with pulse oximeter and demonstrate accurate use of the pulse oximeter.;To learn and understand importance of maintaining oxygen saturations>88%;To learn and demonstrate proper pursed lip breathing techniques or other breathing techniques.;To learn and demonstrate proper use of respiratory medications  To learn and exhibit compliance with exercise, home and travel O2 prescription;To learn and understand importance of monitoring SPO2 with pulse oximeter and demonstrate accurate use of the pulse oximeter.;To learn and understand importance of maintaining oxygen saturations>88%;To learn and demonstrate proper pursed lip breathing techniques or other breathing techniques.;To learn and demonstrate proper use of respiratory  medications     Long  Term Goals  -  Exhibits compliance with exercise, home and travel O2 prescription;Verbalizes importance of monitoring SPO2 with pulse oximeter and return demonstration;Maintenance of O2 saturations>88%;Exhibits proper breathing techniques, such as pursed lip breathing or other method taught during program session;Compliance with respiratory medication;Demonstrates proper use of MDI's  Exhibits compliance with exercise, home and travel O2 prescription;Verbalizes importance of monitoring SPO2 with pulse oximeter and return demonstration;Maintenance of O2 saturations>88%;Exhibits proper breathing techniques, such as pursed lip breathing or other method taught during program session;Compliance with respiratory medication;Demonstrates proper use of MDI's     Goals/Expected Outcomes  Patient to maintain compliance with night o2  Patient to maintain  compliance with night o2  Patient to maintain compliance with night o2        Oxygen Discharge (Final Oxygen Re-Evaluation): Oxygen Re-Evaluation - 06/25/18 0806      Program Oxygen Prescription   Program Oxygen Prescription  None      Home Oxygen   Home Oxygen Device  Portable Concentrator    Sleep Oxygen Prescription  Continuous    Liters per minute  2    Home Exercise Oxygen Prescription  None    Home at Rest Exercise Oxygen Prescription  None    Compliance with Home Oxygen Use  Yes      Goals/Expected Outcomes   Short Term Goals  To learn and exhibit compliance with exercise, home and travel O2 prescription;To learn and understand importance of monitoring SPO2 with pulse oximeter and demonstrate accurate use of the pulse oximeter.;To learn and understand importance of maintaining oxygen saturations>88%;To learn and demonstrate proper pursed lip breathing techniques or other breathing techniques.;To learn and demonstrate proper use of respiratory medications    Long  Term Goals  Exhibits compliance with exercise, home and  travel O2 prescription;Verbalizes importance of monitoring SPO2 with pulse oximeter and return demonstration;Maintenance of O2 saturations>88%;Exhibits proper breathing techniques, such as pursed lip breathing or other method taught during program session;Compliance with respiratory medication;Demonstrates proper use of MDI's    Goals/Expected Outcomes  Patient to maintain compliance with night o2       Initial Exercise Prescription: Initial Exercise Prescription - 04/07/18 1300      Date of Initial Exercise RX and Referring Provider   Date  04/07/18    Referring Provider  Dr. Chase Caller      Bike   Level  0.5    Minutes  17      NuStep   Level  4    SPM  80    Minutes  17    METs  2      Track   Laps  15    Minutes  17      Prescription Details   Frequency (times per week)  2    Duration  Progress to 45 minutes of aerobic exercise without signs/symptoms of physical distress      Intensity   THRR 40-80% of Max Heartrate  62-123    Ratings of Perceived Exertion  11-13    Perceived Dyspnea  0-4      Progression   Progression  Continue progressive overload as per policy without signs/symptoms or physical distress.      Resistance Training   Training Prescription  Yes    Weight  blue bands    Reps  10-15       Perform Capillary Blood Glucose checks as needed.  Exercise Prescription Changes: Exercise Prescription Changes    Row Name 04/14/18 1500 04/28/18 1600 05/07/18 0847 05/26/18 1600 06/09/18 1600     Response to Exercise   Blood Pressure (Admit)  120/60  128/78  132/72  128/68  136/62   Blood Pressure (Exercise)  132/72  138/82  130/76  110/70  144/64   Blood Pressure (Exit)  122/70  122/72  130/76  120/78  134/80   Heart Rate (Admit)  68 bpm  68 bpm  60 bpm  87 bpm  78 bpm   Heart Rate (Exercise)  80 bpm  75 bpm  84 bpm  103 bpm  106 bpm   Heart Rate (Exit)  58 bpm  66 bpm  84 bpm  93 bpm  71 bpm   Oxygen Saturation (Admit)  97 %  98 %  97 %  98 %  99 %    Oxygen Saturation (Exercise)  95 %  97 %  95 %  96 %  93 %   Oxygen Saturation (Exit)  97 %  97 %  95 %  97 %  97 %   Rating of Perceived Exertion (Exercise)  13  11  7  13  12    Perceived Dyspnea (Exercise)  3  1  1  2  2    Duration  Progress to 45 minutes of aerobic exercise without signs/symptoms of physical distress  Progress to 45 minutes of aerobic exercise without signs/symptoms of physical distress  Progress to 45 minutes of aerobic exercise without signs/symptoms of physical distress  Progress to 45 minutes of aerobic exercise without signs/symptoms of physical distress  Progress to 45 minutes of aerobic exercise without signs/symptoms of physical distress   Intensity  Other (comment)  THRR unchanged  THRR unchanged  THRR unchanged  THRR unchanged     Progression   Progression  Continue to progress workloads to maintain intensity without signs/symptoms of physical distress.  Continue to progress workloads to maintain intensity without signs/symptoms of physical distress.  Continue to progress workloads to maintain intensity without signs/symptoms of physical distress.  Continue to progress workloads to maintain intensity without signs/symptoms of physical distress.  Continue to progress workloads to maintain intensity without signs/symptoms of physical distress.     Resistance Training   Training Prescription  Yes  Yes  Yes  Yes  Yes   Weight  blue bands  blue bands  blue bands  blue bands  blue bands   Reps  10-15  10-15  10-15  10-15  10-15   Time  10 Minutes  10 Minutes  10 Minutes  10 Minutes  10 Minutes     Bike   Level  0.5  3  -  -  -   Minutes  17  17  -  -  -     NuStep   Level  -  -  -  5  -   SPM  -  -  -  80  -   Minutes  -  -  -  17  -   METs  -  -  -  2.2  -     Track   Laps  26  27  26   32  52   Minutes  17  17  34  34  [place] Name 06/16/18 0829 07/07/18 1500 07/16/18 1414         Response to Exercise   Blood Pressure (Admit)  138/60  140/76  110/56      Blood Pressure (Exercise)  132/72  132/70  122/60     Blood Pressure (Exit)  120/64  148/62  102/64     Heart Rate (Admit)  74 bpm  60 bpm  69 bpm     Heart Rate (Exercise)  85 bpm  78 bpm  80 bpm     Heart Rate (Exit)  76 bpm  78 bpm  65 bpm     Oxygen Saturation (Admit)  95 %  96 %  93 %     Oxygen Saturation (Exercise)  95 %  96 %  93 %     Oxygen Saturation (Exit)  96 %  94 %  96 %  Rating of Perceived Exertion (Exercise)  13  13  11      Perceived Dyspnea (Exercise)  2  1  1      Duration  Progress to 45 minutes of aerobic exercise without signs/symptoms of physical distress  Progress to 45 minutes of aerobic exercise without signs/symptoms of physical distress  Progress to 45 minutes of aerobic exercise without signs/symptoms of physical distress     Intensity  THRR unchanged  THRR unchanged  THRR unchanged       Progression   Progression  Continue to progress workloads to maintain intensity without signs/symptoms of physical distress.  Continue to progress workloads to maintain intensity without signs/symptoms of physical distress.  Continue to progress workloads to maintain intensity without signs/symptoms of physical distress.       Resistance Training   Training Prescription  Yes  Yes  Yes     Weight  blue bands  blue bands  blue bands     Reps  10-15  10-15  10-15     Time  10 Minutes  10 Minutes  10 Minutes       Interval Training   Interval Training  -  -  No       NuStep   Level  5  5  6      SPM  80  80  80     Minutes  34  34  34     METs  1.3  1.8  2.6       Track   Laps  16  16  -     Minutes  17  17  -        Exercise Comments:   Exercise Goals and Review:   Exercise Goals Re-Evaluation : Exercise Goals Re-Evaluation    Row Name 04/06/18 1038 04/28/18 0734 05/26/18 0938 06/25/18 0807       Exercise Goal Re-Evaluation   Exercise Goals Review  Increase Physical Activity;Able to understand and use Dyspnea scale;Understanding of Exercise  Prescription;Increase Strength and Stamina;Knowledge and understanding of Target Heart Rate Range (THRR);Able to understand and use rate of perceived exertion (RPE) scale  Increase Physical Activity;Able to understand and use Dyspnea scale;Understanding of Exercise Prescription;Increase Strength and Stamina;Knowledge and understanding of Target Heart Rate Range (THRR);Able to understand and use rate of perceived exertion (RPE) scale  Increase Physical Activity;Able to understand and use Dyspnea scale;Understanding of Exercise Prescription;Increase Strength and Stamina;Knowledge and understanding of Target Heart Rate Range (THRR);Able to understand and use rate of perceived exertion (RPE) scale  Increase Physical Activity;Able to understand and use Dyspnea scale;Understanding of Exercise Prescription;Increase Strength and Stamina;Knowledge and understanding of Target Heart Rate Range (THRR);Able to understand and use rate of perceived exertion (RPE) scale    Comments  -  Patient has only attended 2 rehab sessions. Attendance has been consistent due to not always having transportation. Will cont. to monitor and motivate as able.  Patient and I have discussed how to increase his workloads. Patient is interested in doing the weights, however, I think he needs to focus on his cardiovascular health at this point in time. Patient stated understanding. Will cont to monitor and motivate. Will dicuss importance of home exercise.   Patient and I have discussed how to increase his workloads. Patient is interested in doing the weights, however, I think he needs to focus on his cardiovascular health at this point in time. Patient stated understanding. Will cont to monitor and motivate. Will dicuss importance of home  exercise.     Expected Outcomes   Through exercise at rehab and at home, patient will increase strength and stamina. Patient will also gain the confidence and knowledge to adhere to a exercise regime at home.    Through exercise at rehab and at home, patient will increase strength and stamina. Patient will also gain the confidence and knowledge to adhere to a exercise regime at home.   Through exercise at rehab and at home, patient will increase strength and stamina. Patient will also gain the confidence and knowledge to adhere to a exercise regime at home.   Through exercise at rehab and at home, patient will increase strength and stamina. Patient will also gain the confidence and knowledge to adhere to a exercise regime at home.       Discharge Exercise Prescription (Final Exercise Prescription Changes): Exercise Prescription Changes - 07/16/18 1414      Response to Exercise   Blood Pressure (Admit)  110/56    Blood Pressure (Exercise)  122/60    Blood Pressure (Exit)  102/64    Heart Rate (Admit)  69 bpm    Heart Rate (Exercise)  80 bpm    Heart Rate (Exit)  65 bpm    Oxygen Saturation (Admit)  93 %    Oxygen Saturation (Exercise)  93 %    Oxygen Saturation (Exit)  96 %    Rating of Perceived Exertion (Exercise)  11    Perceived Dyspnea (Exercise)  1    Duration  Progress to 45 minutes of aerobic exercise without signs/symptoms of physical distress    Intensity  THRR unchanged      Progression   Progression  Continue to progress workloads to maintain intensity without signs/symptoms of physical distress.      Resistance Training   Training Prescription  Yes    Weight  blue bands    Reps  10-15    Time  10 Minutes      Interval Training   Interval Training  No      NuStep   Level  6    SPM  80    Minutes  34    METs  2.6       Nutrition:  Target Goals: Understanding of nutrition guidelines, daily intake of sodium 1500mg , cholesterol 200mg , calories 30% from fat and 7% or less from saturated fats, daily to have 5 or more servings of fruits and vegetables.  Biometrics:    Nutrition Therapy Plan and Nutrition Goals: Nutrition Therapy & Goals - 04/06/18 1340      Nutrition  Therapy   Diet  Low Sodium       Personal Nutrition Goals   Nutrition Goal  pt to identify and limit food sources of sodium    Personal Goal #2  The pt will consume high-energy, high-nutrient dense beverages when necessary to compensate for decreased oral intake of solid foods.      Intervention Plan   Intervention  Prescribe, educate and counsel regarding individualized specific dietary modifications aiming towards targeted core components such as weight, hypertension, lipid management, diabetes, heart failure and other comorbidities.    Expected Outcomes  Short Term Goal: Understand basic principles of dietary content, such as calories, fat, sodium, cholesterol and nutrients.;Long Term Goal: Adherence to prescribed nutrition plan.       Nutrition Assessments: Nutrition Assessments - 04/06/18 1358      Rate Your Plate Scores   Pre Score  57  Nutrition Goals Re-Evaluation: Nutrition Goals Re-Evaluation    [place] Name 04/16/18 1503             Goals   Nutrition Goal  pt to identify and limit food sources of sodium       Comment  Identify food quantities necessary to achieve wt maintenance/ gain of  -2# per week to a goal wt loss of 6-15 lb at graduation from pulmonary rehab.         Personal Goal #2 Re-Evaluation   Personal Goal #2  The pt will consume high-energy, high-nutrient dense beverages when necessary to compensate for decreased oral intake of solid foods.          Nutrition Goals Discharge (Final Nutrition Goals Re-Evaluation): Nutrition Goals Re-Evaluation - 04/16/18 1503      Goals   Nutrition Goal  pt to identify and limit food sources of sodium    Comment  Identify food quantities necessary to achieve wt maintenance/ gain of  -2# per week to a goal wt loss of 6-15 lb at graduation from pulmonary rehab.      Personal Goal #2 Re-Evaluation   Personal Goal #2  The pt will consume high-energy, high-nutrient dense beverages when necessary to compensate for  decreased oral intake of solid foods.       Psychosocial: Target Goals: Acknowledge presence or absence of significant depression and/or stress, maximize coping skills, provide positive support system. Participant is able to verbalize types and ability to use techniques and skills needed for reducing stress and depression.  Initial Review & Psychosocial Screening: Initial Psych Review & Screening - 04/06/18 1142      Initial Review   Comments  Pt complains of feeling bored.  Pt would like to do more but his shortness of breath keeps him from do much physical activity.  As a result he sits around the house thinking about all the things he can not do anymore and this makes him feel stressed.  Pt is on prozac, klonipin, abilify       Quality of Life Scores:  Scores of 19 and below usually indicate a poorer quality of life in these areas.  A difference of  2-3 points is a clinically meaningful difference.  A difference of 2-3 points in the total score of the Quality of Life Index has been associated with significant improvement in overall quality of life, self-image, physical symptoms, and general health in studies assessing change in quality of life.  PHQ-9: Recent Review Flowsheet Data    Depression screen [facility] 2/9 07/21/2018 04/06/2018 10/27/2017 11/25/2016 09/19/2016   Decreased Interest 1 1 1 1 1    Down, Depressed, Hopeless 1 2 2 1 1    PHQ - 2 Score 2 3 3 2 2    Altered sleeping 0 1 0 2 0   Tired, decreased energy 1 3 2 1 1    Change in appetite 0 0 0 2 0   Feeling bad or failure about yourself  0 1 - 1 0   Trouble concentrating 0 0 0 0 0   Moving slowly or fidgety/restless 1 1 0 1 1   Suicidal thoughts 0 0 0 0 0   PHQ-9 Score 4 9 5 9 4    Difficult doing work/chores Somewhat difficult Somewhat difficult Not difficult at all Somewhat difficult Somewhat difficult     Interpretation of Total Score  Total Score Depression Severity:  1-4 = Minimal depression, 5-9 = Mild depression, 10-14 =  Moderate depression, 15-19 =  Moderately severe depression, 20-27 = Severe depression   Psychosocial Evaluation and Intervention: Psychosocial Evaluation - 07/22/18 1041      Psychosocial Evaluation & Interventions   Comments  Pt maintained good attendance and rarely missed exercise. Pt did get out of the house more.     Expected Outcomes  patient will remain free from psychosocial barriers to participation in pulmonary rehab    Continue Psychosocial Services   No Follow up required      Discharge Psychosocial Assessment & Intervention   Comments  Pt remains heavily engaged with his psychiatrist.       Psychosocial Re-Evaluation: Psychosocial Re-Evaluation    [place] Name 04/29/18 1051 04/29/18 1052 05/27/18 1702 06/25/18 1035       Psychosocial Re-Evaluation   Current issues with  Current Depression;History of Depression;Current Psychotropic Meds  Current Depression;Current Sleep Concerns;Current Stress Concerns  Current Depression;Current Sleep Concerns;Current Stress Concerns  Current Depression;Current Sleep Concerns;Current Stress Concerns    Comments  -  Pt feels he has depression because he is unable to drive to get out and do for himself.  He must depend on others to get him where he needs to go.  Pt is hopeful that participating in pulmonary rehab will get him out of the house more.  Engages with fellow classmates at times.  Pt feels he has depression because he is unable to drive to get out and do for himself.  He must depend on others to get him where he needs to go.  Pt is hopeful that participating in pulmonary rehab will get him out of the house more.  Pt arrives late and leaves early.  Pt with general malaise dalily.  Pt feels he has depression because he is unable to drive to get out and do for himself.  He must depend on others to get him where he needs to go.  Pt is hopeful that participating in pulmonary rehab will get him out of the house more.  Pt arrives late and leaves early.   Pt with general malaise dalily.    Expected Outcomes  patient will remain free from psychosocial barriers to participation in pulmonary rehab  patient will remain free from psychosocial barriers to participation in pulmonary rehab  -  patient will remain free from psychosocial barriers to participation in pulmonary rehab    Interventions  Encouraged to attend Pulmonary Rehabilitation for the exercise;Relaxation education;Stress management education  Encouraged to attend Pulmonary Rehabilitation for the exercise;Relaxation education;Stress management education  -  Encouraged to attend Pulmonary Rehabilitation for the exercise;Relaxation education;Stress management education    Continue Psychosocial Services   Follow up required by staff  Follow up required by staff  Follow up required by staff  -      Initial Review   Source of Stress Concerns  -  -  -  Unable to participate in former interests or hobbies       Psychosocial Discharge (Final Psychosocial Re-Evaluation): Psychosocial Re-Evaluation - 06/25/18 1035      Psychosocial Re-Evaluation   Current issues with  Current Depression;Current Sleep Concerns;Current Stress Concerns    Comments  Pt feels he has depression because he is unable to drive to get out and do for himself.  He must depend on others to get him where he needs to go.  Pt is hopeful that participating in pulmonary rehab will get him out of the house more.  Pt arrives late and leaves early.  Pt with general malaise dalily.  Expected Outcomes  patient will remain free from psychosocial barriers to participation in pulmonary rehab    Interventions  Encouraged to attend Pulmonary Rehabilitation for the exercise;Relaxation education;Stress management education      Initial Review   Source of Stress Concerns  Unable to participate in former interests or hobbies       Education: Education Goals: Education classes will be provided on a weekly basis, covering required topics.  Participant will state understanding/return demonstration of topics presented.  Learning Barriers/Preferences: Learning Barriers/Preferences - 04/06/18 1040      Learning Barriers/Preferences   Learning Barriers  Sight       Education Topics: Risk Factor Reduction:  -Group instruction that is supported by a PowerPoint presentation. Instructor discusses the definition of a risk factor, different risk factors for pulmonary disease, and how the heart and lungs work together.     Nutrition for Pulmonary Patient:  -Group instruction provided by PowerPoint slides, verbal discussion, and written materials to support subject matter. The instructor gives an explanation and review of healthy diet recommendations, which includes a discussion on weight management, recommendations for fruit and vegetable consumption, as well as protein, fluid, caffeine, fiber, sodium, sugar, and alcohol. Tips for eating when patients are short of breath are discussed.   PULMONARY REHAB OTHER RESPIRATORY from 07/16/2018 in [place]  Date  05/07/18  Educator  Rodman Pickle  Instruction Review Code  2- Demonstrated Understanding      Pursed Lip Breathing:  -Group instruction that is supported by demonstration and informational handouts. Instructor discusses the benefits of pursed lip and diaphragmatic breathing and detailed demonstration on how to preform both.     Oxygen Safety:  -Group instruction provided by PowerPoint, verbal discussion, and written material to support subject matter. There is an overview of "What is Oxygen" and "Why do we need it".  Instructor also reviews how to create a safe environment for oxygen use, the importance of using oxygen as prescribed, and the risks of noncompliance. There is a brief discussion on traveling with oxygen and resources the patient may utilize.   PULMONARY REHAB OTHER RESPIRATORY from 07/16/2018 in [place]   Date  06/11/18  Educator  Cloyde Reams  Instruction Review Code  2- Demonstrated Understanding      Oxygen Equipment:  -Group instruction provided by [facility] Staff utilizing handouts, written materials, and equipment demonstrations.   Signs and Symptoms:  -Group instruction provided by written material and verbal discussion to support subject matter. Warning signs and symptoms of infection, stroke, and heart attack are reviewed and when to call the physician/911 reinforced. Tips for preventing the spread of infection discussed.   PULMONARY REHAB OTHER RESPIRATORY from 07/16/2018 in [place]  Date  06/04/18  Educator  rn  Instruction Review Code  2- Demonstrated Understanding      Advanced Directives:  -Group instruction provided by verbal instruction and written material to support subject matter. Instructor reviews Advanced Directive laws and proper instruction for filling out document.   Pulmonary Video:  -Group video education that reviews the importance of medication and oxygen compliance, exercise, good nutrition, pulmonary hygiene, and pursed lip and diaphragmatic breathing for the pulmonary patient.   Exercise for the Pulmonary Patient:  -Group instruction that is supported by a PowerPoint presentation. Instructor discusses benefits of exercise, core components of exercise, frequency, duration, and intensity of an exercise routine, importance of utilizing pulse oximetry during exercise, safety while exercising,  and options of places to exercise outside of rehab.     Pulmonary Medications:  -Verbally interactive group education provided by instructor with focus on inhaled medications and proper administration.   PULMONARY REHAB OTHER RESPIRATORY from 07/16/2018 in [place]  Date  05/19/18  Educator  pharm  Instruction Review Code  2- Demonstrated Understanding      Anatomy and Physiology of the Respiratory  System and Intimacy:  -Group instruction provided by PowerPoint, verbal discussion, and written material to support subject matter. Instructor reviews respiratory cycle and anatomical components of the respiratory system and their functions. Instructor also reviews differences in obstructive and restrictive respiratory diseases with examples of each. Intimacy, Sex, and Sexuality differences are reviewed with a discussion on how relationships can change when diagnosed with pulmonary disease. Common sexual concerns are reviewed.   PULMONARY REHAB OTHER RESPIRATORY from 07/16/2018 in [place]  Date  07/16/18  Educator  RN  Instruction Review Code  2- Demonstrated Understanding      MD DAY -A group question and answer session with a medical doctor that allows participants to ask questions that relate to their pulmonary disease state.   PULMONARY REHAB OTHER RESPIRATORY from 07/16/2018 in [place]  Date  04/14/18  Educator  Dr. Nelda Marseille  Instruction Review Code  1- Verbalizes Understanding      OTHER EDUCATION -Group or individual verbal, written, or video instructions that support the educational goals of the pulmonary rehab program.   PULMONARY REHAB OTHER RESPIRATORY from 07/16/2018 in [place]  Date  05/28/18  Educator  Cloyde Reams  Instruction Review Code  1- Verbalizes Understanding [Sedentary Lifestyle]      Holiday Eating Survival Tips:  -Group instruction provided by [employer], verbal discussion, and written materials to support subject matter. The instructor gives patients tips, tricks, and techniques to help them not only survive but enjoy the holidays despite the onslaught of food that accompanies the holidays.   Knowledge Questionnaire Score:   Core Components/Risk Factors/Patient Goals at Admission: Personal Goals and Risk Factors at Admission - 04/06/18 1140      Core  Components/Risk Factors/Patient Goals on Admission   Expected Outcomes  --   Pt feels stressed about his inability to do  activities that he finds pleasurable      Core Components/Risk Factors/Patient Goals Review:  Goals and Risk Factor Review    Row Name 04/29/18 1034 04/29/18 1035 05/27/18 1703 06/25/18 1035 07/21/18 1341     Core Components/Risk Factors/Patient Goals Review   Personal Goals Review  Weight Management/Obesity;Improve shortness of breath with ADL's;Increase knowledge of respiratory medications and ability to use respiratory devices properly.;Develop more efficient breathing techniques such as purse lipped breathing and diaphragmatic breathing and practicing self-pacing with activity.  Weight Management/Obesity;Improve shortness of breath with ADL's;Increase knowledge of respiratory medications and ability to use respiratory devices properly.;Develop more efficient breathing techniques such as purse lipped breathing and diaphragmatic breathing and practicing self-pacing with activity.;Stress  Weight Management/Obesity;Improve shortness of breath with ADL's;Increase knowledge of respiratory medications and ability to use respiratory devices properly.;Develop more efficient breathing techniques such as purse lipped breathing and diaphragmatic breathing and practicing self-pacing with activity.;Stress  Weight Management/Obesity;Improve shortness of breath with ADL's;Increase knowledge of respiratory medications and ability to use respiratory devices properly.;Develop more efficient breathing techniques such as purse lipped breathing and diaphragmatic breathing and practicing self-pacing with activity.;Stress  -   Review  -  Pt has completed 3 exercise sessions since 8/6.  Pt returns to pulmonary rehab.  Pt with fair attendance and often has to miss due to transportation.  Pt depends upon his wife who is also still working for rides to rehab.  Pt aware on proper breathing techniques, needs  some reminders from time to time this is stressful for pt.  Pt workloads show slight increase from first day.  airdyne increaed to level 1.2, nustep level 4 and laps around the track 26-27 laps.  I anticipate pt will show measurable progress toward pulmonary rehab goals in the the next 30 day assessment  Pt has completed 10 exercise sessions since 8/6.  Pt returns to pulmonary rehab.  Pt with fair attendance and often has to miss due to transportation.  Pt depends upon his wife who is also still working for rides to rehab.  Pt aware on proper breathing techniques, needs some reminders from time to time this is stressful for pt.  Pt enjoys walking when his hips are not a bother to him.  Pt will sometimes ambulate the entire exercise time. Pt with increase of the nustep to level 5 I anticipate pt will show measurable progress toward pulmonary rehab goals in the the next 30 day assessment  Pt has completed 16 exercise sessions and 7 education classes since 8/6.  Pt returns to pulmonary rehab.  Pt with fair attendance and often has to miss due to transportation.  Pt aware on proper breathing techniques, more independent at times.  Pt enjoys walking when his hips are not a bother to him.  Pt will sometimes ambulate the entire exercise time. Pt with increase of the nustep to level 5.  Pt is able to self modify his exercise routine depending upon how he feels.  This will be important for home exercise on his own.  I anticipate pt will show measurable progress toward pulmonary rehab goals in the the next 30 day assessment  Pt graduates today from pulmonary rehab.  Pt plans to ambulate at [facility] couple of times depending on the weather.  10  weights that he uses for upper body training.   Expected Outcomes  -  See Admission Goals/Outcomes  See Admission Goals/Outcomes  See Admission Goals/Outcomes  -   Man Name 07/22/18 1043             Core Components/Risk Factors/Patient Goals Review   Personal Goals  Review  Weight Management/Obesity;Improve shortness of breath with ADL's;Increase knowledge of respiratory medications and ability to use respiratory devices properly.;Develop more efficient breathing techniques such as purse lipped breathing and diaphragmatic breathing and practicing self-pacing with activity.;Stress       Review  Pt graduates today 11/12 from pulmonary rehab.  Pt plans to ambulate at [facility] couple of times depending on the weather.  10  weights that he uses for upper body training.       Expected Outcomes  Pt has made significant progress toward personal goals and outcomes.  Pt will need continued support and encouragement.          Core Components/Risk Factors/Patient Goals at Discharge (Final Review):  Goals and Risk Factor Review - 07/22/18 1043      Core Components/Risk Factors/Patient Goals Review   Personal Goals Review  Weight Management/Obesity;Improve shortness of breath with ADL's;Increase knowledge of respiratory medications and ability to use respiratory devices properly.;Develop more efficient breathing techniques such as purse lipped breathing and diaphragmatic breathing and practicing self-pacing with activity.;Stress  Review  Pt graduates today 11/12 from pulmonary rehab.  Pt plans to ambulate at [facility] couple of times depending on the weather.  10  weights that he uses for upper body training.    Expected Outcomes  Pt has made significant progress toward personal goals and outcomes.  Pt will need continued support and encouragement.       ITP Comments: ITP Comments    Row Name 04/06/18 1015 04/29/18 1034 05/27/18 1701 06/25/18 1035     ITP Comments  Dr. Jennet Maduro,  Medical Director  Dr. Jennet Maduro,  Medical Director  Dr. Jennet Maduro,  Medical Director Pulmonary Rehab  Dr. Jennet Maduro,  Medical Director Pulmonary Rehab       Maurice Small RN, BSN Cardiac and Pulmonary Rehab Nurse Navigator

## 2018-07-23 DIAGNOSIS — H524 Presbyopia: Secondary | ICD-10-CM | POA: Diagnosis not present

## 2018-08-10 ENCOUNTER — Telehealth: Payer: Self-pay | Admitting: Internal Medicine

## 2018-08-10 MED ORDER — PREDNISONE 10 MG PO TABS: ORAL_TABLET | ORAL | 0 refills | Status: DC

## 2018-08-10 MED ORDER — DOXYCYCLINE HYCLATE 100 MG PO TABS: 100.0000 mg | ORAL_TABLET | Freq: Two times a day (BID) | ORAL | 0 refills | Status: DC

## 2018-08-10 NOTE — Telephone Encounter (Signed)
Rx's have been sent to pt's pharmacy.  Called pt letting him know this had been done. Pt expressed understanding. Nothing further needed.

## 2018-08-10 NOTE — Telephone Encounter (Signed)
Allergies  Allergen Reactions  . Propoxyphene Nausea And Vomiting  . Amoxicillin Nausea And Vomiting  . Ciprofloxacin Nausea Only  . 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

## 2018-08-10 NOTE — Telephone Encounter (Signed)
Called pt who stated he has had a productive cough with brown mucus and also has head congestion.  Pt denies any fever. Pt states symptoms have been going on for about 2-3 days and pt is wanting to have something prescribed before symptoms worsen.  At this time, pt states he is unable to come in for an appt.  MR, please advise on this for pt. Thanks!

## 2018-08-11 ENCOUNTER — Telehealth: Payer: Self-pay | Admitting: Internal Medicine

## 2018-08-11 NOTE — Telephone Encounter (Signed)
Spoke with pt's wife. She states that they were to have medications called in from the pt's phone call yesterday. These prescriptions were sent in by [facility] [place]. I called [facility], they received the pred taper but not the Doxy. I have given a verbal order for Doxy per MR. Pt's wife is aware of this information. Nothing further was needed at this time.

## 2018-08-16 DIAGNOSIS — L0291 Cutaneous abscess, unspecified: Secondary | ICD-10-CM | POA: Diagnosis not present

## 2018-08-16 DIAGNOSIS — R2681 Unsteadiness on feet: Secondary | ICD-10-CM | POA: Diagnosis not present

## 2018-08-16 DIAGNOSIS — I5030 Unspecified diastolic (congestive) heart failure: Secondary | ICD-10-CM | POA: Diagnosis not present

## 2018-08-16 DIAGNOSIS — L0390 Cellulitis, unspecified: Secondary | ICD-10-CM | POA: Diagnosis not present

## 2018-08-16 DIAGNOSIS — J189 Pneumonia, unspecified organism: Secondary | ICD-10-CM | POA: Diagnosis not present

## 2018-08-16 DIAGNOSIS — J449 Chronic obstructive pulmonary disease, unspecified: Secondary | ICD-10-CM | POA: Diagnosis not present

## 2018-09-03 ENCOUNTER — Telehealth: Payer: Self-pay | Admitting: Internal Medicine

## 2018-09-03 NOTE — Telephone Encounter (Signed)
Called and spoke with pt's wife [name] regarding FMLA paperwork She currently works with [employer] and pt will be doing pulmonary rehab and maintenance She is getting all paperwork gathered from [facility], and just wanting to provide heads up She verbalized nothing is really needed at this time Nothing further needed.

## 2018-09-04 ENCOUNTER — Telehealth: Payer: Self-pay | Admitting: Internal Medicine

## 2018-09-04 NOTE — Telephone Encounter (Signed)
Called and spoke with pt's wife regarding FMLA paperwork She works with [employer] is sending the FMLA paperwork for pt to complete and sign Pt will be picking up the paperwork to complete 09/07/2018 Will need to f/u with pt's wife and pt next week  Routing message Raquel Sarna for review

## 2018-09-06 NOTE — Addendum Note (Signed)
Encounter addended by: Rowe Pavy, RN on: 09/06/2018 11:01 PM  Actions taken: Episode resolved, Flowsheet data copied forward, Visit Navigator Flowsheet section accepted, Clinical Note Signed

## 2018-09-06 NOTE — Progress Notes (Signed)
Discharge Progress Report  Patient Details  Name: Travis Salinas MRN: [id number] Date of Birth: [birth year] Referring Provider:     Pulmonary Rehab Walk Test from 04/07/2018 in [facility]  Referring Provider  Dr. Chase Caller       Number of Visits: 23 exercise sessions and 10 education classes  Reason for Discharge:  Patient reached a stable level of exercise. Patient independent in their exercise. Patient has met program and personal goals.  Smoking History:  Social History   Tobacco Use  Smoking Status Former Smoker  . Packs/day: 1.00  . Years: 32.00  . Pack years: 32.00  . Types: Cigarettes  . Last attempt to quit: 2002  . Years since quitting: 18.0  Smokeless Tobacco Never Used    Diagnosis:  Other emphysema ([place])  ADL UCSD: Pulmonary Assessment Scores    Row Name 04/07/18 1300         ADL UCSD   ADL Phase  Entry       mMRC Score   mMRC Score  0        Initial Exercise Prescription: Initial Exercise Prescription - 04/07/18 1300      Date of Initial Exercise RX and Referring Provider   Date  04/07/18    Referring Provider  Dr. Chase Caller      Bike   Level  0.5    Minutes  17      NuStep   Level  4    SPM  80    Minutes  17    METs  2      Track   Laps  15    Minutes  17      Prescription Details   Frequency (times per week)  2    Duration  Progress to 45 minutes of aerobic exercise without signs/symptoms of physical distress      Intensity   THRR 40-80% of Max Heartrate  62-123    Ratings of Perceived Exertion  11-13    Perceived Dyspnea  0-4      Progression   Progression  Continue progressive overload as per policy without signs/symptoms or physical distress.      Resistance Training   Training Prescription  Yes    Weight  blue bands    Reps  10-15       Discharge Exercise Prescription (Final Exercise Prescription Changes): Exercise Prescription Changes - 07/16/18 1414      Response to  Exercise   Blood Pressure (Admit)  110/56    Blood Pressure (Exercise)  122/60    Blood Pressure (Exit)  102/64    Heart Rate (Admit)  69 bpm    Heart Rate (Exercise)  80 bpm    Heart Rate (Exit)  65 bpm    Oxygen Saturation (Admit)  93 %    Oxygen Saturation (Exercise)  93 %    Oxygen Saturation (Exit)  96 %    Rating of Perceived Exertion (Exercise)  11    Perceived Dyspnea (Exercise)  1    Duration  Progress to 45 minutes of aerobic exercise without signs/symptoms of physical distress    Intensity  THRR unchanged      Progression   Progression  Continue to progress workloads to maintain intensity without signs/symptoms of physical distress.      Resistance Training   Training Prescription  Yes    Weight  blue bands    Reps  10-15    Time  10  Minutes      Interval Training   Interval Training  No      NuStep   Level  6    SPM  80    Minutes  34    METs  2.6       Functional Capacity: 6 Minute Walk    Row Name 04/07/18 1255 07/21/18 1629       6 Minute Walk   Phase  Discharge  Discharge    Distance  1310 feet  1610 feet    Distance Feet Change  -  300 ft    Walk Time  6 minutes  6 minutes    # of Rest Breaks  0  0    MPH  2.48  3.04    METS  2.91  3.3    RPE  11  11    Perceived Dyspnea   1  1    Symptoms  No  Yes (comment)    Comments  -  hip/knee weakness from arthritis     Resting HR  75 bpm  63 bpm    Resting BP  114/60  120/60    Resting Oxygen Saturation   97 %  98 %    Exercise Oxygen Saturation  during 6 min walk  93 %  95 %    Max Ex. HR  100 bpm  110 bpm    Max Ex. BP  130/60  130/60    2 Minute Post BP  -  114/58      Interval HR   1 Minute HR  90  98    2 Minute HR  100  107    3 Minute HR  97  102    4 Minute HR  100  106    5 Minute HR  96  110    6 Minute HR  101  109    2 Minute Post HR  88  83    Interval Heart Rate?  Yes  Yes      Interval Oxygen   Interval Oxygen?  Yes  Yes    Baseline Oxygen Saturation %  97 %  98 %    1  Minute Oxygen Saturation %  96 %  96 %    1 Minute Liters of Oxygen  0 L  0 L    2 Minute Oxygen Saturation %  93 %  95 %    2 Minute Liters of Oxygen  0 L  0 L    3 Minute Oxygen Saturation %  94 %  95 %    3 Minute Liters of Oxygen  0 L  0 L    4 Minute Oxygen Saturation %  94 %  96 %    4 Minute Liters of Oxygen  0 L  0 L    5 Minute Oxygen Saturation %  95 %  95 %    5 Minute Liters of Oxygen  0 L  0 L    6 Minute Oxygen Saturation %  95 %  95 %    6 Minute Liters of Oxygen  0 L  0 L    2 Minute Post Oxygen Saturation %  94 %  97 %    2 Minute Post Liters of Oxygen  0 L  0 L       Psychological, QOL, Others - Outcomes: PHQ 2/9: Depression screen [facility] 2/9 07/21/2018 04/06/2018 10/27/2017 11/25/2016 09/19/2016  Decreased Interest 1  _0 Down, Depressed, Hopeless _1 PHQ - 2 Score _2 Altered sleeping 0 1 0 2 0  Tired, decreased energy _3 Change in appetite 0 0 0 2 0  Feeling bad or failure about yourself  0 1 - 1 0  Trouble concentrating 0 0 0 0 0  Moving slowly or fidgety/restless 1 1 0 1 1  Suicidal thoughts 0 0 0 0 0  PHQ-9 Score _4 Difficult doing work/chores Somewhat difficult Somewhat difficult Not difficult at all Somewhat difficult Somewhat difficult    Quality of Life:   Personal Goals: Goals established at orientation with interventions provided to work toward goal. Personal Goals and Risk Factors at Admission - 04/06/18 1140      Core Components/Risk Factors/Patient Goals on Admission   Expected Outcomes  --   Pt feels stressed about his inability to do  activities that he finds pleasurable       Personal Goals Discharge: Goals and Risk Factor Review    Row Name 04/29/18 1034 04/29/18 1035 05/27/18 1703 06/25/18 1035 07/21/18 1341     Core Components/Risk Factors/Patient Goals Review   Personal Goals Review  Weight Management/Obesity;Improve shortness of breath with ADL's;Increase knowledge of respiratory medications and  ability to use respiratory devices properly.;Develop more efficient breathing techniques such as purse lipped breathing and diaphragmatic breathing and practicing self-pacing with activity.  Weight Management/Obesity;Improve shortness of breath with ADL's;Increase knowledge of respiratory medications and ability to use respiratory devices properly.;Develop more efficient breathing techniques such as purse lipped breathing and diaphragmatic breathing and practicing self-pacing with activity.;Stress  Weight Management/Obesity;Improve shortness of breath with ADL's;Increase knowledge of respiratory medications and ability to use respiratory devices properly.;Develop more efficient breathing techniques such as purse lipped breathing and diaphragmatic breathing and practicing self-pacing with activity.;Stress  Weight Management/Obesity;Improve shortness of breath with ADL's;Increase knowledge of respiratory medications and ability to use respiratory devices properly.;Develop more efficient breathing techniques such as purse lipped breathing and diaphragmatic breathing and practicing self-pacing with activity.;Stress  -   Review  -  Pt has completed 3 exercise sessions since 8/6.  Pt returns to pulmonary rehab.  Pt with fair attendance and often has to miss due to transportation.  Pt depends upon his wife who is also still working for rides to rehab.  Pt aware on proper breathing techniques, needs some reminders from time to time this is stressful for pt.  Pt workloads show slight increase from first day.  airdyne increaed to level 1.2, nustep level 4 and laps around the track 26-27 laps.  I anticipate pt will show measurable progress toward pulmonary rehab goals in the the next 30 day assessment  Pt has completed 10 exercise sessions since 8/6.  Pt returns to pulmonary rehab.  Pt with fair attendance and often has to miss due to transportation.  Pt depends upon his wife who is also still working for rides to rehab.  Pt  aware on proper breathing techniques, needs some reminders from time to time this is stressful for pt.  Pt enjoys walking when his hips are not a bother to him.  Pt will sometimes ambulate the entire exercise time. Pt with increase of the nustep to level 5 I anticipate pt will show measurable progress toward pulmonary rehab goals in the the next 30 day assessment  Pt has completed 16 exercise sessions and 7 education classes  since 8/6.  Pt returns to pulmonary rehab.  Pt with fair attendance and often has to miss due to transportation.  Pt aware on proper breathing techniques, more independent at times.  Pt enjoys walking when his hips are not a bother to him.  Pt will sometimes ambulate the entire exercise time. Pt with increase of the nustep to level 5.  Pt is able to self modify his exercise routine depending upon how he feels.  This will be important for home exercise on his own.  I anticipate pt will show measurable progress toward pulmonary rehab goals in the the next 30 day assessment  Pt graduates today from pulmonary rehab.  Pt plans to ambulate at [facility] couple of times depending on the weather.  10  weights that he uses for upper body training.   Expected Outcomes  -  See Admission Goals/Outcomes  See Admission Goals/Outcomes  See Admission Goals/Outcomes  -   Row Name 07/22/18 1043 09/06/18 2255           Core Components/Risk Factors/Patient Goals Review   Personal Goals Review  Weight Management/Obesity;Improve shortness of breath with ADL's;Increase knowledge of respiratory medications and ability to use respiratory devices properly.;Develop more efficient breathing techniques such as purse lipped breathing and diaphragmatic breathing and practicing self-pacing with activity.;Stress  -      Review  Pt graduates today 11/12 from pulmonary rehab.  Pt plans to ambulate at [facility] couple of times depending on the weather.  10  weights that he uses for upper body training.  Pt  graduates today 11/12 from pulmonary rehab with the completion of 23 exercise sessions.  Pt plans to ambulate at [facility] couple of times depending on the weather.  10  weights that he uses for upper body training.      Expected Outcomes  Pt has made significant progress toward personal goals and outcomes.  Pt will need continued support and encouragement.  Pt has made significant progress toward personal goals and outcomes.  Pt will need continued support and encouragement.         Exercise Goals and Review:   Exercise Goals Re-Evaluation: Exercise Goals Re-Evaluation    Row Name 04/06/18 1038 04/28/18 0734 05/26/18 0938 06/25/18 0807       Exercise Goal Re-Evaluation   Exercise Goals Review  Increase Physical Activity;Able to understand and use Dyspnea scale;Understanding of Exercise Prescription;Increase Strength and Stamina;Knowledge and understanding of Target Heart Rate Range (THRR);Able to understand and use rate of perceived exertion (RPE) scale  Increase Physical Activity;Able to understand and use Dyspnea scale;Understanding of Exercise Prescription;Increase Strength and Stamina;Knowledge and understanding of Target Heart Rate Range (THRR);Able to understand and use rate of perceived exertion (RPE) scale  Increase Physical Activity;Able to understand and use Dyspnea scale;Understanding of Exercise Prescription;Increase Strength and Stamina;Knowledge and understanding of Target Heart Rate Range (THRR);Able to understand and use rate of perceived exertion (RPE) scale  Increase Physical Activity;Able to understand and use Dyspnea scale;Understanding of Exercise Prescription;Increase Strength and Stamina;Knowledge and understanding of Target Heart Rate Range (THRR);Able to understand and use rate of perceived exertion (RPE) scale    Comments  -  Patient has only attended 2 rehab sessions. Attendance has been consistent due to not always having transportation. Will cont. to monitor and  motivate as able.  Patient and I have discussed how to increase his workloads. Patient is interested in doing the weights, however, I think he needs to focus on his cardiovascular health at  this point in time. Patient stated understanding. Will cont to monitor and motivate. Will dicuss importance of home exercise.   Patient and I have discussed how to increase his workloads. Patient is interested in doing the weights, however, I think he needs to focus on his cardiovascular health at this point in time. Patient stated understanding. Will cont to monitor and motivate. Will dicuss importance of home exercise.     Expected Outcomes   Through exercise at rehab and at home, patient will increase strength and stamina. Patient will also gain the confidence and knowledge to adhere to a exercise regime at home.   Through exercise at rehab and at home, patient will increase strength and stamina. Patient will also gain the confidence and knowledge to adhere to a exercise regime at home.   Through exercise at rehab and at home, patient will increase strength and stamina. Patient will also gain the confidence and knowledge to adhere to a exercise regime at home.   Through exercise at rehab and at home, patient will increase strength and stamina. Patient will also gain the confidence and knowledge to adhere to a exercise regime at home.       Nutrition & Weight - Outcomes:    Nutrition: Nutrition Therapy & Goals - 08/07/18 1639      Nutrition Therapy   Diet  Low Sodium       Personal Nutrition Goals   Nutrition Goal  pt to identify and limit food sources of sodium    Personal Goal #2  The pt will consume high-energy, high-nutrient dense beverages when necessary to compensate for decreased oral intake of solid foods.      Intervention Plan   Intervention  Prescribe, educate and counsel regarding individualized specific dietary modifications aiming towards targeted core components such as weight, hypertension,  lipid management, diabetes, heart failure and other comorbidities.    Expected Outcomes  Short Term Goal: Understand basic principles of dietary content, such as calories, fat, sodium, cholesterol and nutrients.;Long Term Goal: Adherence to prescribed nutrition plan.       Nutrition Discharge: Nutrition Assessments - 08/07/18 1639      Rate Your Plate Scores   Pre Score  57    Post Score  --   no post test available to asses at this time      Education Questionnaire Score:   Goals reviewed with patient. Cherre Huger, BSN Cardiac and Training and development officer

## 2018-09-07 NOTE — Telephone Encounter (Signed)
Pt's wife is calling back [phone]

## 2018-09-07 NOTE — Telephone Encounter (Signed)
Called and spoke with patients wife, she stated that she came by the office today and was told that we have not received the forms. Patients wife called [employer] and they stated that they would resend the forms but it would take up to 24 hours before they are received. I will forward this over to [facility] to keep a look out.

## 2018-09-08 NOTE — Telephone Encounter (Signed)
Fax has been received for pt's FMLA. Will give to [facility].

## 2018-09-08 NOTE — Telephone Encounter (Signed)
Rec'd FMLA paperwork - this needs to go to [employer] first to be processed. Sent to [employer] via interoffice mail -pr

## 2018-09-15 ENCOUNTER — Telehealth: Payer: Self-pay | Admitting: Internal Medicine

## 2018-09-15 NOTE — Telephone Encounter (Signed)
Folder has been given to MR for him to complete. Will give back to [place] once finished.

## 2018-09-16 DIAGNOSIS — L0291 Cutaneous abscess, unspecified: Secondary | ICD-10-CM | POA: Diagnosis not present

## 2018-09-16 DIAGNOSIS — L0390 Cellulitis, unspecified: Secondary | ICD-10-CM | POA: Diagnosis not present

## 2018-09-16 DIAGNOSIS — R2681 Unsteadiness on feet: Secondary | ICD-10-CM | POA: Diagnosis not present

## 2018-09-16 DIAGNOSIS — I5030 Unspecified diastolic (congestive) heart failure: Secondary | ICD-10-CM | POA: Diagnosis not present

## 2018-09-16 DIAGNOSIS — J189 Pneumonia, unspecified organism: Secondary | ICD-10-CM | POA: Diagnosis not present

## 2018-09-16 DIAGNOSIS — J449 Chronic obstructive pulmonary disease, unspecified: Secondary | ICD-10-CM | POA: Diagnosis not present

## 2018-09-16 NOTE — Telephone Encounter (Signed)
Folder was returned to me by MR and I have given it back to [place].

## 2018-09-16 NOTE — Telephone Encounter (Signed)
Rec'd completed paperwork back from [place]. Fwd to [employer] via interoffice -pr

## 2018-09-30 IMAGING — DX DG CHEST 1V PORT
2 series · 2 of 2 positions shown · non-contrast
Comparison: May 27, 2017

CLINICAL DATA: Increased shortness of breath.

EXAM:
PORTABLE CHEST 1 VIEW

[chest ap (1 of 2)]
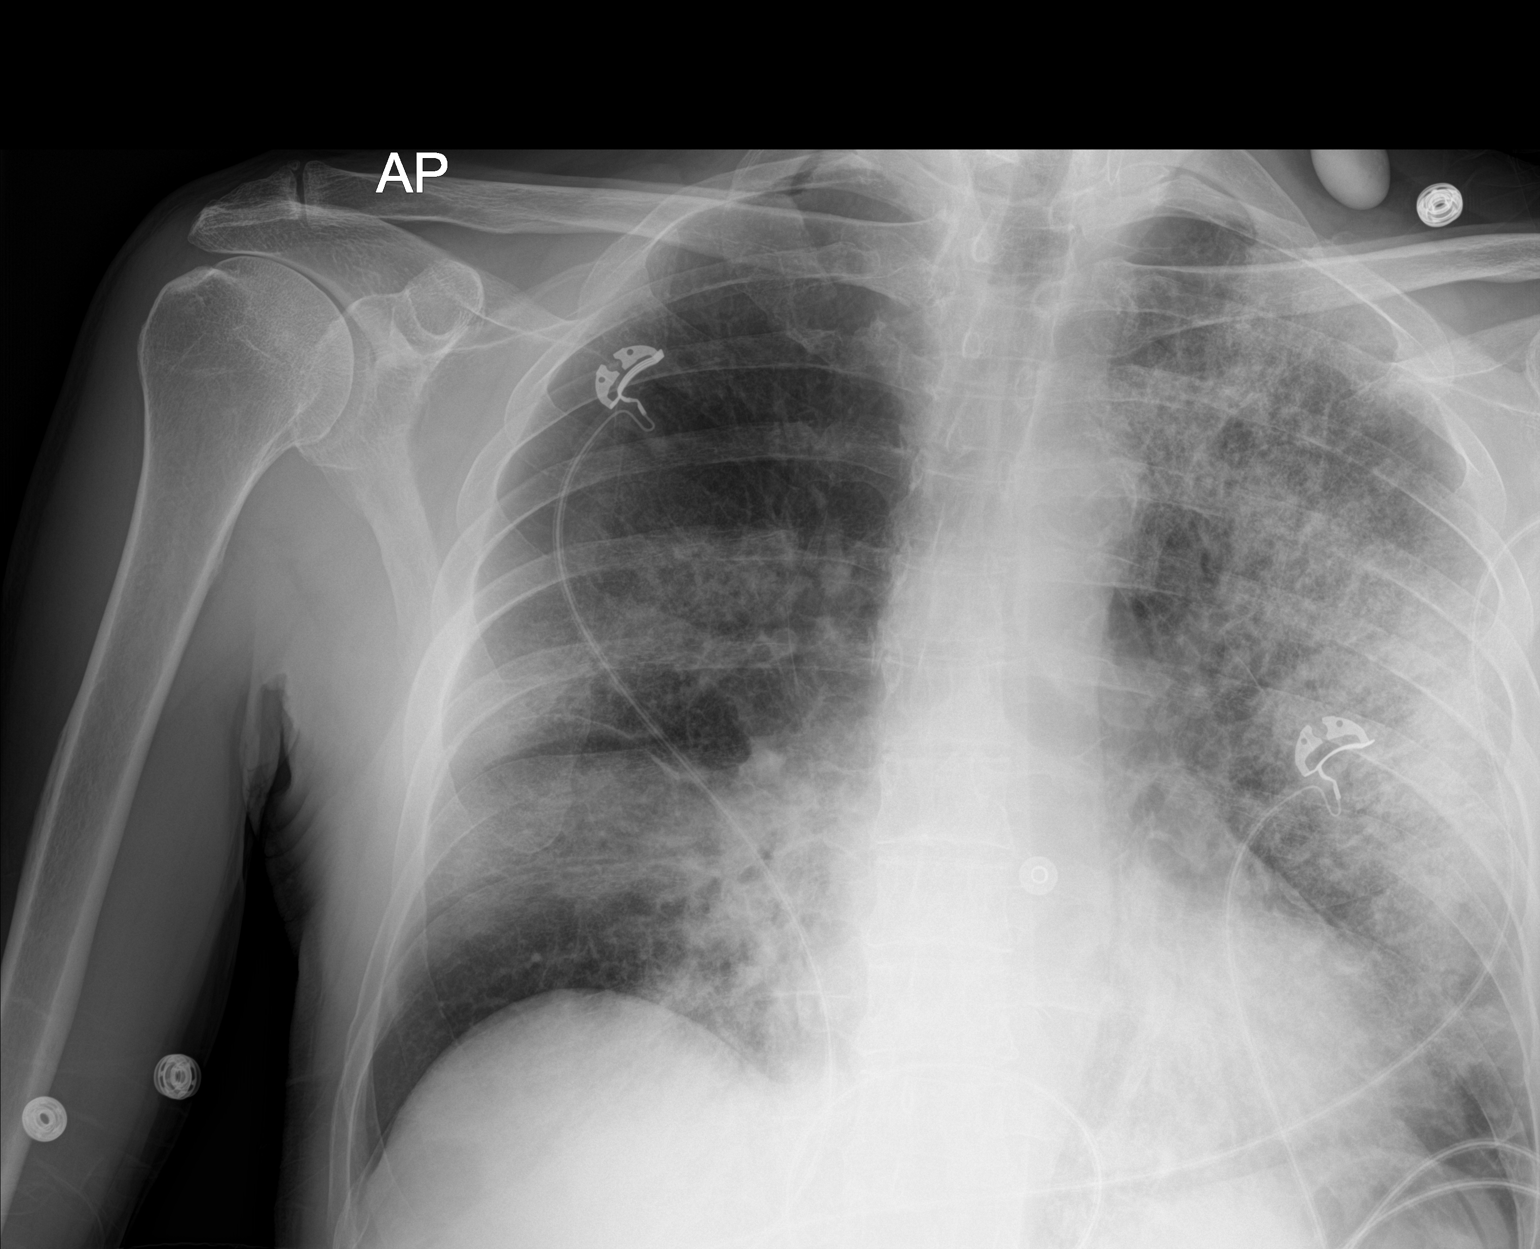

[chest ap (2 of 2)]
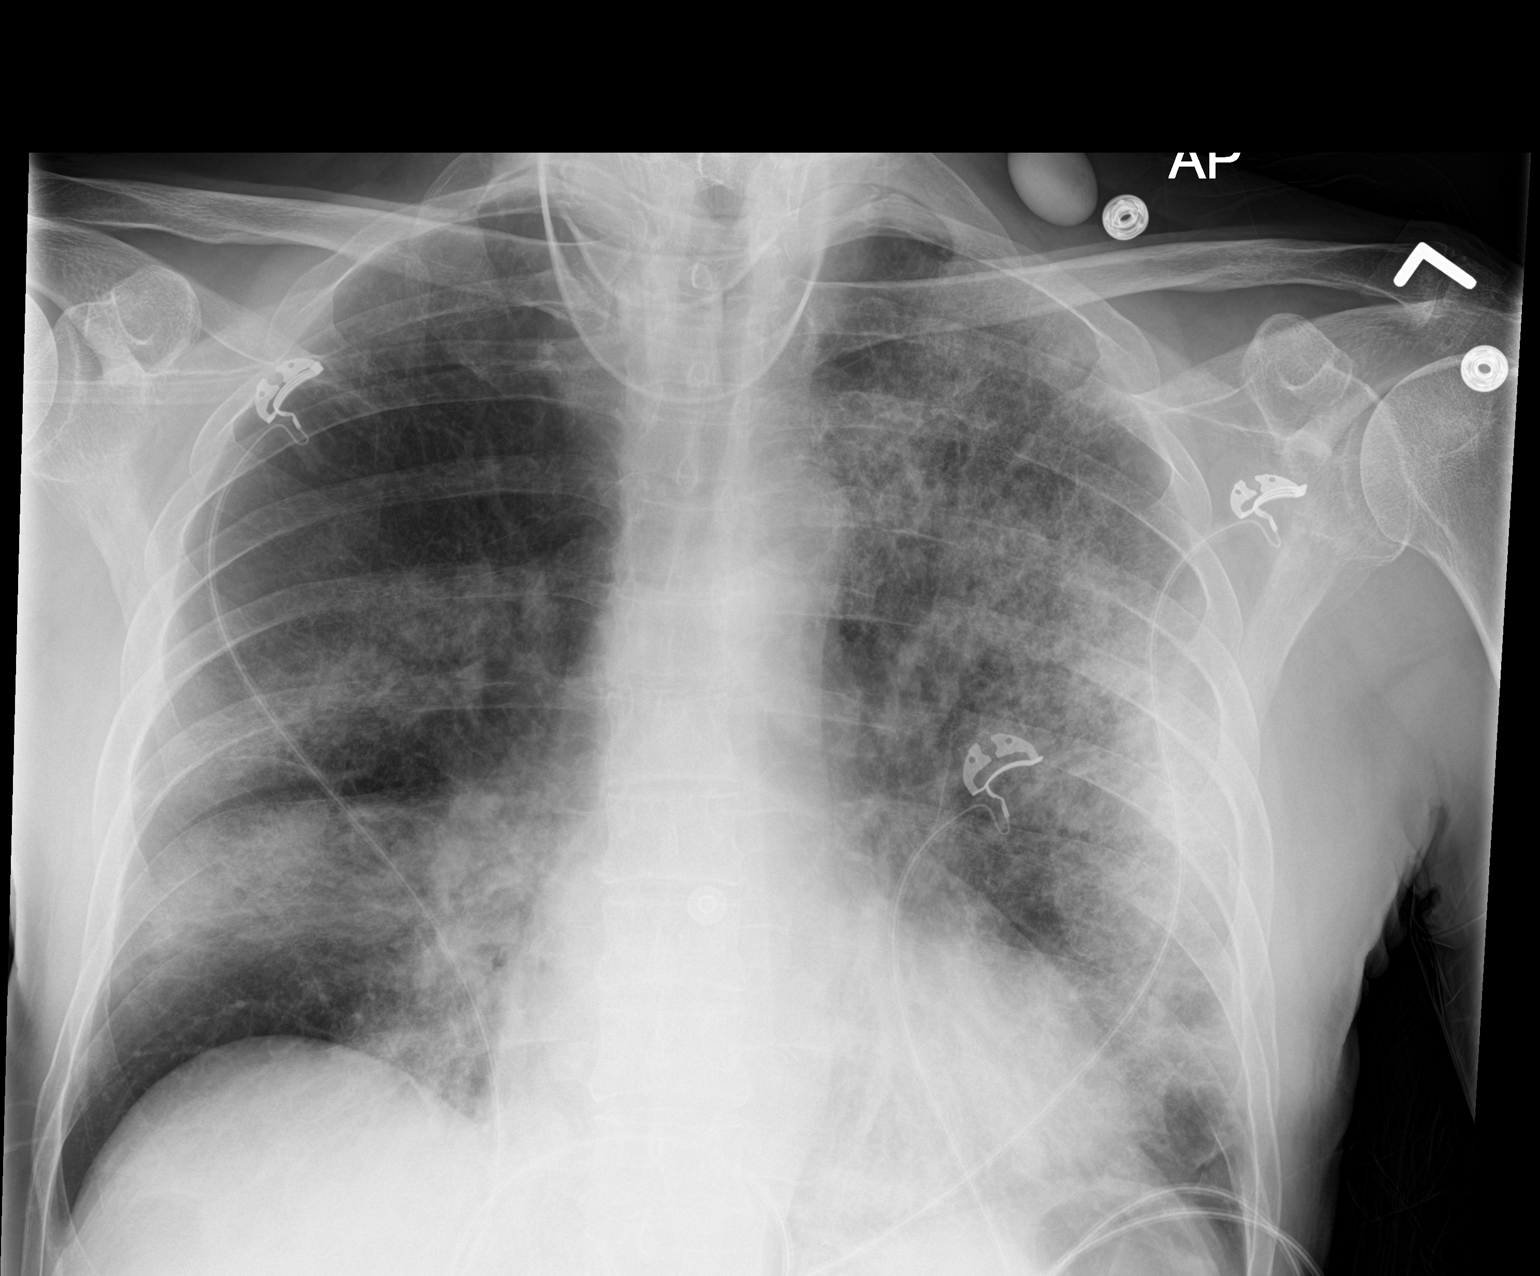

[2 of 2 positions shown; findings below may reference images not displayed]

FINDINGS: Increasing bilateral infiltrates, left greater than right, most
consistent with the multifocal infectious process. Noncardiogenic
edema is a possibility. The heart, hila, and mediastinum are
otherwise unchanged and unremarkable.
IMPRESSION: Worsening bilateral pulmonary infiltrates as above.

## 2018-10-01 ENCOUNTER — Encounter: Payer: Self-pay | Admitting: Internal Medicine

## 2018-10-01 ENCOUNTER — Ambulatory Visit (INDEPENDENT_AMBULATORY_CARE_PROVIDER_SITE_OTHER): Payer: 59 | Admitting: Internal Medicine

## 2018-10-01 VITALS — BP 128/70 | HR 64 | Ht 70.5 in | Wt 149.2 lb

## 2018-10-01 DIAGNOSIS — R5383 Other fatigue: Secondary | ICD-10-CM | POA: Diagnosis not present

## 2018-10-01 DIAGNOSIS — R5381 Other malaise: Secondary | ICD-10-CM

## 2018-10-01 DIAGNOSIS — R0609 Other forms of dyspnea: Secondary | ICD-10-CM

## 2018-10-01 DIAGNOSIS — J439 Emphysema, unspecified: Secondary | ICD-10-CM | POA: Diagnosis not present

## 2018-10-01 DIAGNOSIS — Z23 Encounter for immunization: Secondary | ICD-10-CM | POA: Diagnosis not present

## 2018-10-01 NOTE — Patient Instructions (Addendum)
ICD-10-CM   1. Pulmonary emphysema, unspecified emphysema type ([place]) J43.9   2. Physical deconditioning R53.81   3. Dyspnea on exertion R06.09   4. Other fatigue R53.83     Currently stable You seem more deconditioned without rehab; otherwise unclear why symptoms of fatigue and shortness of breath are mre   Plan Try to get back into an exercise program Continue spiriva daily with albuterol as needed CT chest withoiut contrast next few to several months; will call with results Do spirometry and dlco test in 6 months Prevnar vaccine 10/01/2018 if you have not had it  Followup Regular lung clinic in 6 month but after spirometry and dlco testing

## 2018-10-01 NOTE — Progress Notes (Signed)
Subjective:     Patient ID: Travis Salinas, male   DOB: Sep 01, 1951, 68 y.o.   MRN: [id number]  HPI     PCP Kathyrn Lass, MD   HPI   IOV  08/22/2017   Chief Complaint  Patient presents with  . Advice Only    Referred by Dr. Lennette Bihari Via for COPD.  Pt does have complaints of SOB. Pt had pna and was hospitalized 10/5-10/26    Travis Salinas. Travis Salinas is a 68 year old male who is been referred by primary care physician for COPD.  History is obtained from him, talking to his wife and review of the outside chart from primary care physician and past medical chart in the electronic health record.  He carries a diagnosis of COPD for many years.  He is always maintained himself on albuterol as needed.  He quit smoking 16 years ago.  Then all of a sudden in September 2018 he was hospitalized for rhinovirus and associated pneumococcal bacteremia with pneumonia.  He spent over a month in the hospital including several weeks in the intensive care unit being treated with BiPAP but not intubated.  He spent time between the critical care service and also the hospitalist service.  He ended up getting discharged in October 2018 to a long-term acute care facility and from there to an inpatient rehab facility.  He has now been home for 6 weeks.  At this point in time his COPD CAT score is 12.  He feels improved and back to baseline but according to his wife he still far away from baseline.  Overall fatigue and shortness of breath with exertion or major issues.  This is documented in the COPD CAD score.  His current COPD medications only albuterol as needed.  He does not have pulmonary function test in the past.  Review of the chart also shows he had acute diastolic heart failure while in the hospital and he was diuresed.  A few days ago primary care physician is stopped his Lasix and his told the family that I could restart it at my discretion.  His last set of lab work was in November 2018 with primary care  physician but in October 2018 in our health record system he had normal creatinine but anemia of chronic disease/critical illness  His last chest x-ray that I personally visualized was June 17, 2017 that still showed pulmonary infiltrates.  According to the family he had another chest x-ray in November 2018 that still shows pulmonary infiltrates.  He has not had follow-up imaging since then.   OV 10/01/2017  Chief Complaint  Patient presents with  . Follow-up    PFT done today.  CT scan done 09/19/17.  Pt states he has been doing much better since last visit. States he has mild SOB with exertion. Pt's wife states he used his O2 last night and some this morning which pt stated it was to prepare for today's visit.   Follow-up emphysema and recent hospitalization for pneumococcal pneumonia  Present his wife.  In the interim he has been on Spiriva.  Smoking is in remission.  He continues to improve.  COPD CAT score is dropped to 6.  He feels great.  He is here to start pulmonary rehabilitation.  He had follow-up CT scan of the chest in January 2019 and when compared to his pneumonia in September 2018 his or pulmonary infiltrates of all clear except he has bilateral upper lobe scarring.  He  does have evidence of emphysema on a CT chest.  His pulmonary function test is normal except for reduction in DLCO at 40%.  He continues to use oxygen at night.  He is compliant with his Spiriva.  His blood work is all normal.  His wife wants FMLA paperwork filled because she has to take him to rehab.  They are wondering about the rehab referral.    OV 02/17/2018  Chief Complaint  Patient presents with  . Follow-up    3-month follow up visit. Pt states he has been doing well and denies any complaints.   Follow-up pulmonary emphysema with isolated reduction in diffusion capacity and history of smoking. CT Jan 2019 with UL fibrosis. Sept 2018 hospitalization for RV and pneumococcal pneumonia  Last seen January  2019.  After that he had COPD exacerbation and seen acutely and treated as an outpatient.  This was in March 2019 when he saw my colleague Dr. Halford Chessman.  Chest x-ray at that time was clear.  Currently he is feeling well with Spiriva.  He denies any smoking.  Wife also states he does not smoke anymore.  COPD CAT score is 9 which is close to his baseline.  He stopped attending pulmonary rehabilitation because of travel and his exacerbation and he wants to rejoin.  Given his emphysema and age of 50 he might be a candidate for lung cancer screening program with a nurse intake smoking history seems inadequate for that.  We will have to reassess his smoking history in detail.      OV 10/01/2018  Subjective:  Patient ID: Travis Salinas, male , DOB: October 24, 1950 , age 53 y.o. , MRN: [id number] , ADDRESS: [place] [address]   10/01/2018 -   Chief Complaint  Patient presents with  . Follow-up    Pt states he has had no energy the last 3 months and states his breathing has been worse. Pt also states it feels like someone is sitting on his chest.    Follow-up pulmonary emphysema (alpha 1 MS) with isolated reduction in diffusion capacity and history of smoking. CT Jan 2019 with UL fibrosis. Sept 2018 hospitalization for RV and pneumococcal pneumonia    HPI Travis Salinas 68 y.o. -last visit June 2019.  He presents with his wife.  He was attending pulmonary rehabilitation till November 2019.  After that he is not attending.  Now he is reporting more fatigue.  He states he lives on the third floor with a roommate [?  He does not live with his wife].  However he does state that he can walk up all 3 floors without stopping.  His COPD CAT score seems significantly worse than even in the past when he had pneumonia.  I am not sure he is answered this correctly.  Nevertheless fatigue and shortness of breath with exertion seem to be a dominant thing.  His wife feels he needs to get back to  exercise.  He insists he does not smoke.  Wife also does not he does not smoke.  Review of his immunization records sho that he could benefit from pneumonia vaccine.  He does not recollect having had one.    CAT COPD Symptom & Quality of Life Score (GSK trademark) 0 is no burden. 5 is highest burden 08/22/2017  1 10/01/2017 spiriva and time 02/17/2018  10/01/2018   Never Cough -> Cough all the time 0 0 0 5  No phlegm in chest -> Chest  is full of phlegm 1 0 1 0  No chest tightness -> Chest feels very tight 4 0 2 5  No dyspnea for 1 flight stairs/hill -> Very dyspneic for 1 flight of stairs 2 2 0 5  No limitations for ADL at home -> Very limited with ADL at home 0 1 0 5  Confident leaving home -> Not at all confident leaving home 0 0 0 0  Sleep soundly -> Do not sleep soundly because of lung condition 4 0 3 0  Lots of Energy -> No energy at all  3 3 5   TOTAL Score (max 40)  12 6 9 25      ROS - per HPI     has a past medical history of Arthritis, Cervical disc herniation, COPD (chronic obstructive pulmonary disease) ([place]), Headache, Hepatitis C, and Hypertension.   reports that he quit smoking about 18 years ago. His smoking use included cigarettes. He has a 32.00 pack-year smoking history. He has never used smokeless tobacco.  Past Surgical History:  Procedure Laterality Date  . APPLICATION OF A-CELL OF EXTREMITY Left 12/05/2016   Procedure: APPLICATION OF A-CELL OF EXTREMITY;  Surgeon: Loel Lofty Dillingham, DO;  Location: [facility];  Service: Plastics;  Laterality: Left;  . APPLICATION OF WOUND VAC Left 12/05/2016   Procedure: APPLICATION OF WOUND VAC;  Surgeon: Loel Lofty Dillingham, DO;  Location: [facility];  Service: Plastics;  Laterality: Left;  . I&D EXTREMITY Left 11/03/2016   Procedure: IRRIGATION AND DEBRIDEMENT EXTREMITY;  Surgeon: Leandrew Koyanagi, MD;  Location: [facility];  Service: Orthopedics;  Laterality: Left;  . I&D EXTREMITY Left 12/05/2016   Procedure: IRRIGATION AND DEBRIDEMENT  EXTREMITY;  Surgeon: Loel Lofty Dillingham, DO;  Location: [facility];  Service: Plastics;  Laterality: Left;  . INCISION AND DRAINAGE ABSCESS Right 11/12/2013   Procedure: INCISION AND DRAINAGE AND OPEN PACKING OF RIGHT CALF  ABSCESS;  Surgeon: Earnstine Regal, MD;  Location: [facility];  Service: General;  Laterality: Right;  . INCISION AND DRAINAGE OF WOUND Left 01/29/2017   Procedure: IRRIGATION AND DEBRIDEMENT OF LEFT LEG WOUND WITH ABRA PLACEMENT AND PLACEMENT OF WOUND VAC;  Surgeon: Wallace Going, DO;  Location: [facility];  Service: Plastics;  Laterality: Left;  . KNEE SURGERY      Allergies  Allergen Reactions  . Propoxyphene Nausea And Vomiting  . Amoxicillin Nausea And Vomiting  . Ciprofloxacin Nausea Only  . Depakote [Divalproex Sodium] Other (See Comments)    hallucinations    Immunization History  Administered Date(s) Administered  . Influenza, High Dose Seasonal PF 06/23/2017, 05/13/2018  . Influenza, Seasonal, Injecte, Preservative Fre 07/10/2016  . Tdap 11/05/2013  . Zoster Recombinat (Shingrix) 05/06/2018, 07/09/2018    Family History  Problem Relation Age of Onset  . Heart disease Mother   . Stroke Mother   . Heart disease Father   . Stroke Father      Current Outpatient Medications:  .  acetaminophen (TYLENOL) 325 MG tablet, Take 2 tablets (650 mg total) by mouth every 6 (six) hours as needed for mild pain (or Fever >/= 101)., Disp: , Rfl:  .  albuterol (PROVENTIL) (2.5 MG/3ML) 0.083% nebulizer solution, Take 3 mLs (2.5 mg total) by nebulization every 2 (two) hours as needed for wheezing or shortness of breath., Disp: 75 mL, Rfl: 12 .  ARIPiprazole (ABILIFY) 15 MG tablet, 10 mg. 7/29 dosage decreased to 10 mg, Disp: , Rfl: 3 .  atenolol (TENORMIN) 25 MG tablet, Take 1  tablet (25 mg total) by mouth every morning., Disp: , Rfl:  .  clonazePAM (KLONOPIN) 0.5 MG tablet, Take 1 tablet (0.5 mg total) by mouth 2 (two) times daily. (Patient taking differently: Take 1 mg by mouth  2 (two) times daily. ), Disp: 30 tablet, Rfl: 0 .  feeding supplement, ENSURE ENLIVE, (ENSURE ENLIVE) LIQD, Take 237 mLs by mouth 2 (two) times daily between meals. (Patient not taking: Reported on 04/06/2018), Disp: 237 mL, Rfl: 12 .  FLUoxetine (PROZAC) 20 MG capsule, Take 1 capsule (20 mg total) by mouth daily. (Patient taking differently: Take 10 mg by mouth daily. ), Disp: , Rfl: 3 .  fluticasone (FLONASE) 50 MCG/ACT nasal spray, Place 1 spray into both nostrils every morning., Disp: , Rfl: 0 .  ibuprofen (ADVIL,MOTRIN) 600 MG tablet, Take 1 tablet (600 mg total) by mouth 4 (four) times daily., Disp: 30 tablet, Rfl: 0 .  ipratropium-albuterol (DUONEB) 0.5-2.5 (3) MG/3ML SOLN, Take 3 mLs by nebulization 3 (three) times daily. (Patient taking differently: Take 3 mLs by nebulization 3 (three) times daily as needed. ), Disp: 360 mL, Rfl:  .  lamoTRIgine (LAMICTAL) 200 MG tablet, Take 1 tablet (200 mg total) by mouth every morning., Disp: , Rfl:  .  silodosin (RAPAFLO) 8 MG CAPS capsule, Take 8 mg daily with breakfast by mouth., Disp: , Rfl:  .  SPIRIVA RESPIMAT 2.5 MCG/ACT AERS, INHALE 2 PUFFS INTO THE LUNGS DAILY., Disp: 4 g, Rfl: 5      Objective:   Vitals:   10/01/18 1550  BP: 128/70  Pulse: 64  SpO2: 98%  Weight: 149 lb 3.2 oz (67.7 kg)  Height: 5' 10.5" (1.791 m)    Estimated body mass index is 21.11 kg/m as calculated from the following:   Height as of this encounter: 5' 10.5" (1.791 m).   Weight as of this encounter: 149 lb 3.2 oz (67.7 kg).  @WEIGHTCHANGE @  [employer]   10/01/18 1550  Weight: 149 lb 3.2 oz (67.7 kg)     Physical Exam  General Appearance:    Alert, cooperative, no distress, appears stated age - older , Deconditioned looking - yes , OBESE  - no, Sitting on Wheelchair -  no  Head:    Normocephalic, without obvious abnormality, atraumatic  Eyes:    PERRL, conjunctiva/corneas clear,  Ears:    Normal TM's and external ear canals, both ears  Nose:   Nares  normal, septum midline, mucosa normal, no drainage    or sinus tenderness. OXYGEN ON  - no . Patient is @ ra   Throat:   Lips, mucosa, and tongue normal; teeth and gums normal. Cyanosis on lips - no  Neck:   Supple, symmetrical, trachea midline, no adenopathy;    thyroid:  no enlargement/tenderness/nodules; no carotid   bruit or JVD  Back:     Symmetric, no curvature, ROM normal, no CVA tenderness  Lungs:     Distress - no , Wheeze no, Barrell Chest - no, Purse lip breathing - no, Crackles - no   Chest Wall:    No tenderness or deformity.    Heart:    Regular rate and rhythm, S1 and S2 normal, no rub   or gallop, Murmur - no  Breast Exam:    NOT DONE  Abdomen:     Soft, non-tender, bowel sounds active all four quadrants,    no masses, no organomegaly. Visceral obesity - no  Genitalia:   NOT DONE  Rectal:  NOT DONE  Extremities:   Extremities - normal, Has Cane - no, Clubbing - no, Edema - no  Pulses:   2+ and symmetric all extremities  Skin:   Stigmata of Connective Tissue Disease - no  Lymph nodes:   Cervical, supraclavicular, and axillary nodes normal  Psychiatric:  Neurologic:   Pleasant - yes, Anxious - no, Flat affect - yes  CAm-ICU - neg, Alert and Oriented x 3 - yes, Moves all 4s - yes, Speech - normal, Cognition - intact           Assessment:       ICD-10-CM   1. Pulmonary emphysema, unspecified emphysema type ([place]) J43.9 CT Chest Wo Contrast    Pulmonary function test  2. Physical deconditioning R53.81   3. Dyspnea on exertion R06.09   4. Other fatigue R53.83   5. Need for prophylactic vaccination against Streptococcus pneumoniae (pneumococcus) Z23 Pneumococcal conjugate vaccine 13-valent IM (Prevnar)       Plan:     Patient Instructions     ICD-10-CM   1. Pulmonary emphysema, unspecified emphysema type ([place]) J43.9   2. Physical deconditioning R53.81   3. Dyspnea on exertion R06.09   4. Other fatigue R53.83     Currently stable You seem more  deconditioned without rehab; otherwise unclear why symptoms of fatigue and shortness of breath are mre   Plan Try to get back into an exercise program Continue spiriva daily with albuterol as needed CT chest withoiut contrast next few to several months; will call with results Do spirometry and dlco test in 6 months Prevnar vaccine 10/01/2018 if you have not had it  Followup Regular lung clinic in 6 month but after spirometry and dlco testing   > 50% of this > 25 min visit spent in face to face counseling or coordination of care - by this undersigned MD - Dr Brand Males. This includes one or more of the following documented above: discussion of test results, diagnostic or treatment recommendations, prognosis, risks and benefits of management options, instructions, education, compliance or risk-factor reduction   SIGNATURE    Dr. Brand Males, M.D., F.C.C.P,  Pulmonary and Critical Care Medicine Staff Physician, [place] Director - Interstitial Lung Disease  Program  [facility] [place], [place], [address]  Pager: [phone], If no answer or between  15:00h - 7:00h: call [phone] Telephone: [phone]  4:50 PM 10/01/2018

## 2018-10-02 IMAGING — DX DG CHEST 1V PORT
1 series · 1 of 1 positions shown · non-contrast
Comparison: CT 05/27/2017.  Chest x-ray 05/27/2017.

CLINICAL DATA: Respiratory failure.

EXAM:
PORTABLE CHEST 1 VIEW

[chest ap]
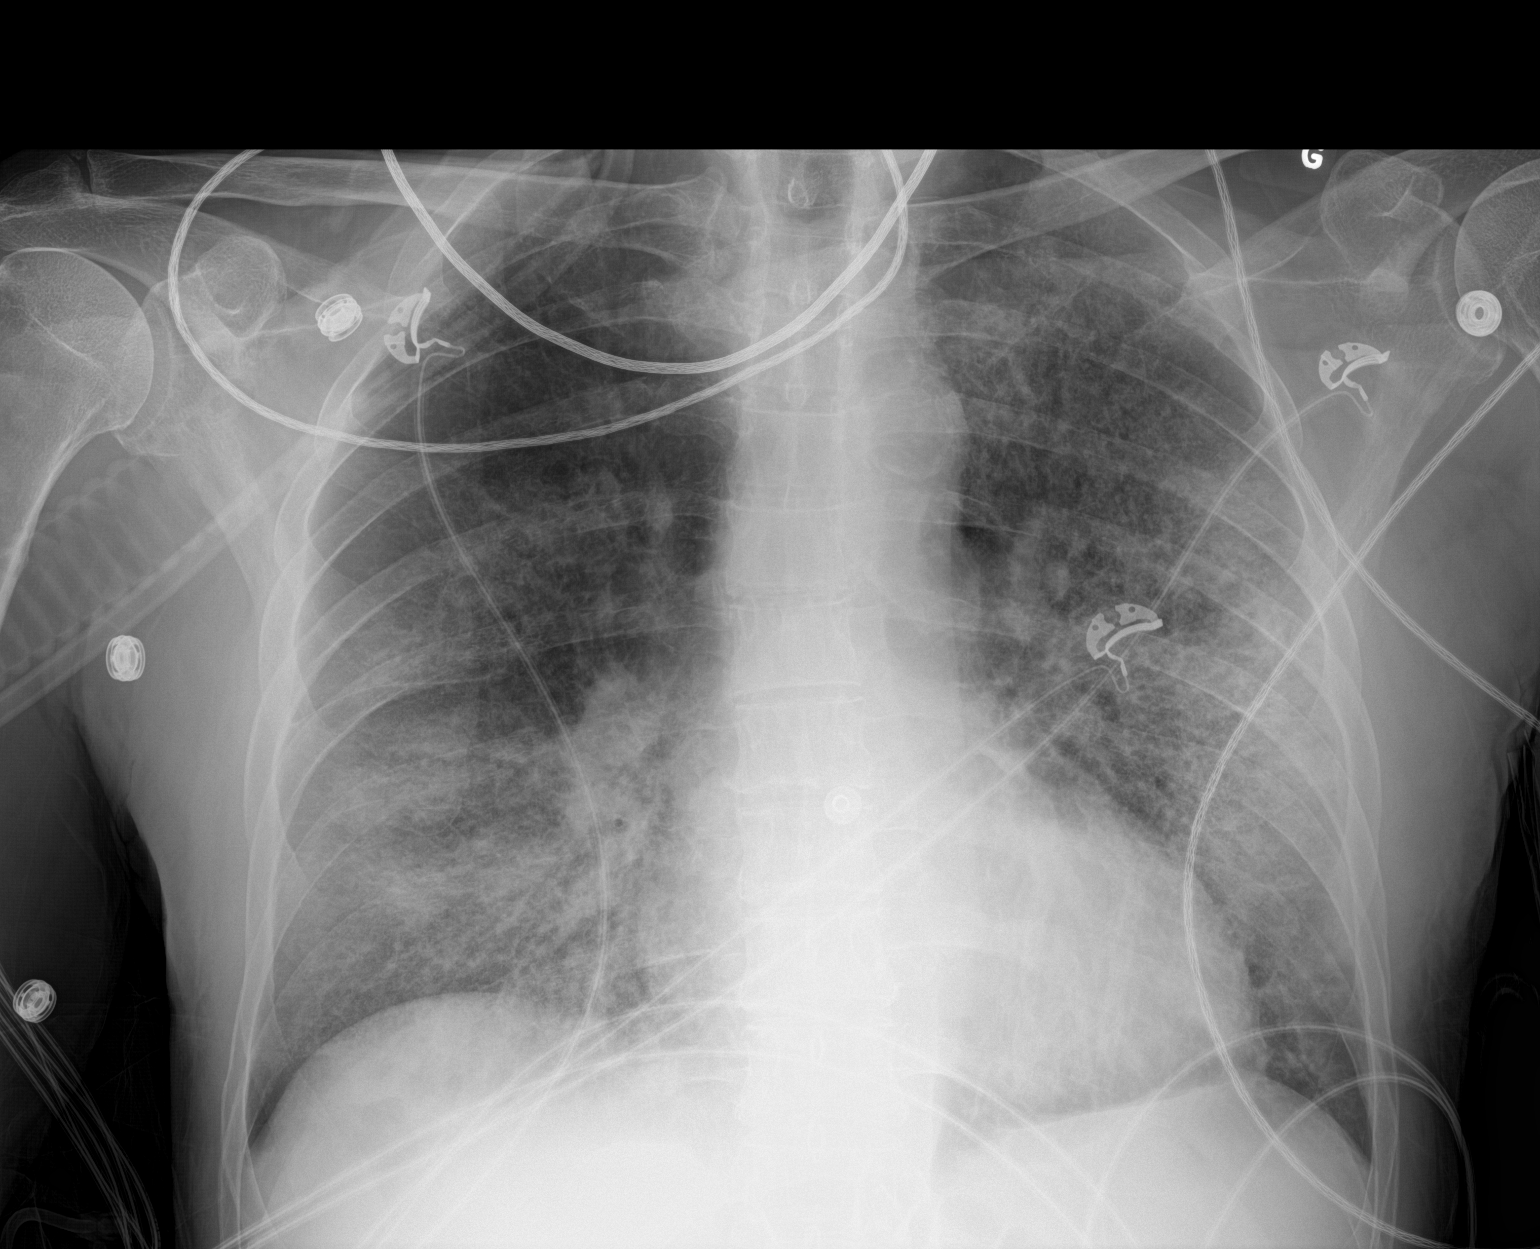

[1 of 1 positions shown; findings below may reference images not displayed]

FINDINGS: Mediastinum hilar structures are normal. Heart size normal. Diffuse
bilateral pulmonary infiltrates again noted. No significant change.
Stable cardiomegaly. No pleural effusion or pneumothorax.
IMPRESSION: 1. Diffuse multifocal bilateral pulmonary infiltrates without
significant change.

2. Stable cardiomegaly.

## 2018-10-07 DIAGNOSIS — F411 Generalized anxiety disorder: Secondary | ICD-10-CM | POA: Diagnosis not present

## 2018-10-07 DIAGNOSIS — F341 Dysthymic disorder: Secondary | ICD-10-CM | POA: Diagnosis not present

## 2018-10-07 DIAGNOSIS — F3341 Major depressive disorder, recurrent, in partial remission: Secondary | ICD-10-CM | POA: Diagnosis not present

## 2018-10-07 DIAGNOSIS — G2111 Neuroleptic induced parkinsonism: Secondary | ICD-10-CM | POA: Diagnosis not present

## 2018-10-08 DIAGNOSIS — M1711 Unilateral primary osteoarthritis, right knee: Secondary | ICD-10-CM | POA: Diagnosis not present

## 2018-10-17 DIAGNOSIS — R2681 Unsteadiness on feet: Secondary | ICD-10-CM | POA: Diagnosis not present

## 2018-10-17 DIAGNOSIS — J189 Pneumonia, unspecified organism: Secondary | ICD-10-CM | POA: Diagnosis not present

## 2018-10-17 DIAGNOSIS — L0390 Cellulitis, unspecified: Secondary | ICD-10-CM | POA: Diagnosis not present

## 2018-10-17 DIAGNOSIS — I5030 Unspecified diastolic (congestive) heart failure: Secondary | ICD-10-CM | POA: Diagnosis not present

## 2018-10-17 DIAGNOSIS — L0291 Cutaneous abscess, unspecified: Secondary | ICD-10-CM | POA: Diagnosis not present

## 2018-10-17 DIAGNOSIS — J449 Chronic obstructive pulmonary disease, unspecified: Secondary | ICD-10-CM | POA: Diagnosis not present

## 2018-10-19 ENCOUNTER — Telehealth: Payer: Self-pay | Admitting: Internal Medicine

## 2018-10-19 NOTE — Telephone Encounter (Signed)
Primary Pulmonologist: Dr. Chase Caller Last office visit and with whom: 10/01/2018 with Dr. Chase Caller What do we see them for (pulmonary problems): Emphysema Last OV assessment/plan:  Plan Try to get back into an exercise program Continue spiriva daily with albuterol as needed CT chest withoiut contrast next few to several months; will call with results Do spirometry and dlco test in 6 months Prevnar vaccine 10/01/2018 if you have not had it  Followup Regular lung clinic in 6 month but after spirometry and dlco testing  Was appointment offered to patient (explain)?  Yes, pt declined   Reason for call:  Spoke with pt. States that he is not feeling well. Reports coughing, shortness of breath and fatigue. Cough is producing brown mucus. Denies chest tightness, wheezing, fever/chills/sweats. Symptoms started 4-5 days ago. Pt has been taking Mucinex OTC with no relief. He was offered an appointment but he declined, he wants something sent to his pharmacy.  Judson Roch - please advise. Thanks!

## 2018-10-19 NOTE — Telephone Encounter (Signed)
Spoke with pt. He is aware of Sarah's response. Pt is going to call back as he has to find transportation.

## 2018-10-19 NOTE — Telephone Encounter (Signed)
He needs to be seen. Please schedule an acute OV. Thanks

## 2018-10-20 NOTE — Telephone Encounter (Signed)
Called patient, unable to reach left message to give us a call back. 

## 2018-10-21 NOTE — Telephone Encounter (Signed)
Pt has been scheduled to see Beth on 10/22/2018. Nothing further was needed.

## 2018-10-22 ENCOUNTER — Ambulatory Visit (INDEPENDENT_AMBULATORY_CARE_PROVIDER_SITE_OTHER): Payer: 59 | Admitting: Primary Care

## 2018-10-22 ENCOUNTER — Encounter: Payer: Self-pay | Admitting: Primary Care

## 2018-10-22 DIAGNOSIS — J439 Emphysema, unspecified: Secondary | ICD-10-CM | POA: Diagnosis not present

## 2018-10-22 MED ORDER — PREDNISONE 10 MG PO TABS: ORAL_TABLET | ORAL | 0 refills | Status: DC

## 2018-10-22 MED ORDER — BENZONATATE 100 MG PO CAPS: 200.0000 mg | ORAL_CAPSULE | Freq: Three times a day (TID) | ORAL | 1 refills | Status: DC

## 2018-10-22 MED ORDER — DOXYCYCLINE HYCLATE 100 MG PO TABS: 100.0000 mg | ORAL_TABLET | Freq: Two times a day (BID) | ORAL | 0 refills | Status: DC

## 2018-10-22 NOTE — Assessment & Plan Note (Signed)
-   Acute COPD exacerbation with bronchitis symptoms - RX doxycyline 1 tab BID x 7 days and prednisone 20mg  x 5 days - I would not recommend Hycodan as pateint is on xanax and oxycontin - Advised continue mucinex and delsym cough syrup twice daily. Tessalone perles TID prn cough.  - Needs repeat CT chest w/o contrast per [facility] last office note in March  - Return if no improvement or symptoms worsen

## 2018-10-22 NOTE — Progress Notes (Signed)
@Patient  ID: Travis Salinas, male    DOB: 09/08/1951, 68 y.o.   MRN: [id number]  Chief Complaint  Patient presents with  . Acute Visit    Reports increased coughing, fatigue and shortness of breath. Cough is producing green mucus. Denies chest tightness, fever/chills/sweats. Symptoms started last week. Has been taking Mucinex and Robitussin with minimal relief.    Referring provider: Kathyrn Lass, MD  HPI: 68 year old male, former smoker quit 2002 (32 pack year hx). PMH significant for COPD, pneumonia, acute respiratory failure, hypertension, chronic hep C, liver fibrosis, IVDU/substance abuse. Patient of Dr. Gerome Apley, last seen on 10/01/18. Evidence on emphysema on CT chest. Pulmonary function test normal except for reduced DLCO 40%. Using oxygen at night, compliant with Spiriva. Completed pulmonary rehab.   10/22/2018 Patient presents today for acute visit for bronchitis symptoms. Accompanied by wife. Complains of cough, sob and fatigue x1 week. Cough productive with green mucus. Breathing about the same or a little worse. Continues taking mucinex. Denies hemoptysis, fever, chest tightness or wheeze.    Allergies  Allergen Reactions  . Propoxyphene Nausea And Vomiting  . Amoxicillin Nausea And Vomiting  . Ciprofloxacin Nausea Only  . Depakote [Divalproex Sodium] Other (See Comments)    hallucinations    Immunization History  Administered Date(s) Administered  . Influenza, High Dose Seasonal PF 06/23/2017, 05/13/2018  . Influenza, Seasonal, Injecte, Preservative Fre 07/10/2016  . Pneumococcal Conjugate-13 10/01/2018  . Tdap 11/05/2013  . Zoster Recombinat (Shingrix) 05/06/2018, 07/09/2018    Past Medical History:  Diagnosis Date  . Arthritis   . Cervical disc herniation   . COPD (chronic obstructive pulmonary disease) ([place])   . Headache    " due to antibiotics"  . Hepatitis C   . Hypertension     Tobacco History: Social History   Tobacco Use  Smoking Status  Former Smoker  . Packs/day: 1.00  . Years: 32.00  . Pack years: 32.00  . Types: Cigarettes  . Last attempt to quit: 2002  . Years since quitting: 18.1  Smokeless Tobacco Never Used   Counseling given: Not Answered   Outpatient Medications Prior to Visit  Medication Sig Dispense Refill  . acetaminophen (TYLENOL) 325 MG tablet Take 2 tablets (650 mg total) by mouth every 6 (six) hours as needed for mild pain (or Fever >/= 101).    Marland Kitchen albuterol (PROVENTIL) (2.5 MG/3ML) 0.083% nebulizer solution Take 3 mLs (2.5 mg total) by nebulization every 2 (two) hours as needed for wheezing or shortness of breath. 75 mL 12  . amantadine (SYMMETREL) 100 MG capsule Take 50 mg by mouth 2 (two) times daily.    . ARIPiprazole (ABILIFY) 15 MG tablet 10 mg. 7/29 dosage decreased to 10 mg  3  . atenolol (TENORMIN) 25 MG tablet Take 12.5 mg by mouth daily.    . clonazePAM (KLONOPIN) 0.5 MG tablet Take 1 tablet (0.5 mg total) by mouth 2 (two) times daily. (Patient taking differently: Take 1 mg by mouth 2 (two) times daily. ) 30 tablet 0  . feeding supplement, ENSURE ENLIVE, (ENSURE ENLIVE) LIQD Take 237 mLs by mouth 2 (two) times daily between meals. 237 mL 12  . FLUoxetine (PROZAC) 10 MG tablet Take 10 mg by mouth daily.    . fluticasone (FLONASE) 50 MCG/ACT nasal spray Place 1 spray into both nostrils every morning.  0  . ibuprofen (ADVIL,MOTRIN) 600 MG tablet Take 1 tablet (600 mg total) by mouth 4 (four) times daily. 30 tablet 0  .  ipratropium-albuterol (DUONEB) 0.5-2.5 (3) MG/3ML SOLN Take 3 mLs by nebulization 3 (three) times daily. (Patient taking differently: Take 3 mLs by nebulization 3 (three) times daily as needed. ) 360 mL   . lamoTRIgine (LAMICTAL) 200 MG tablet Take 1 tablet (200 mg total) by mouth every morning.    . silodosin (RAPAFLO) 8 MG CAPS capsule Take 8 mg daily with breakfast by mouth.    . SPIRIVA RESPIMAT 2.5 MCG/ACT AERS INHALE 2 PUFFS INTO THE LUNGS DAILY. 4 g 5  . atenolol (TENORMIN) 25  MG tablet Take 1 tablet (25 mg total) by mouth every morning.    Marland Kitchen FLUoxetine (PROZAC) 20 MG capsule Take 1 capsule (20 mg total) by mouth daily. (Patient taking differently: Take 10 mg by mouth daily. )  3   No facility-administered medications prior to visit.     Review of Systems  Review of Systems  Constitutional: Positive for fatigue.  HENT: Negative.   Eyes: Negative.   Respiratory: Positive for cough and shortness of breath. Negative for chest tightness and wheezing.   Cardiovascular: Negative.     Physical Exam  BP 138/78 (BP Location: Right Arm, Cuff Size: Normal)   Pulse 69   Ht 5\' 11"  (1.803 m)   Wt 146 lb (66.2 kg)   SpO2 95%   BMI 20.36 kg/m  Physical Exam Constitutional:      General: He is not in acute distress.    Appearance: Normal appearance. He is well-developed and normal weight. He is not ill-appearing.  HENT:     Head: Normocephalic and atraumatic.     Mouth/Throat:     Mouth: Mucous membranes are moist.     Pharynx: Oropharynx is clear.  Eyes:     Pupils: Pupils are equal, round, and reactive to light.  Neck:     Musculoskeletal: Normal range of motion and neck supple.  Cardiovascular:     Rate and Rhythm: Normal rate and regular rhythm.     Heart sounds: Normal heart sounds.  Pulmonary:     Effort: Pulmonary effort is normal. No respiratory distress.     Breath sounds: Normal breath sounds. No wheezing.  Skin:    General: Skin is warm and dry.     Findings: No erythema or rash.  Neurological:     Mental Status: He is alert and oriented to person, place, and time.  Psychiatric:        Behavior: Behavior normal.        Judgment: Judgment normal.      Lab Results:  CBC    Component Value Date/Time   WBC 10.9 (H) 08/22/2017 1235   RBC 4.43 08/22/2017 1235   HGB 11.5 (L) 08/22/2017 1235   HCT 37.3 (L) 08/22/2017 1235   PLT 442.0 (H) 08/22/2017 1235   MCV 84.2 08/22/2017 1235   MCH 26.5 07/03/2017 0552   MCHC 30.9 08/22/2017 1235    RDW 14.9 08/22/2017 1235   LYMPHSABS 2.8 08/22/2017 1235   MONOABS 1.0 08/22/2017 1235   EOSABS 0.5 08/22/2017 1235   BASOSABS 0.1 08/22/2017 1235    BMET    Component Value Date/Time   NA 141 08/22/2017 1235   K 4.5 08/22/2017 1235   CL 107 08/22/2017 1235   CO2 28 08/22/2017 1235   GLUCOSE 97 08/22/2017 1235   BUN 22 08/22/2017 1235   CREATININE 1.14 08/22/2017 1235   CREATININE 1.22 11/25/2016 1543   CALCIUM 9.3 08/22/2017 1235   GFRNONAA >60 07/03/2017 3716  GFRNONAA 71 04/23/2016 1638   GFRAA >60 07/03/2017 0552   GFRAA 82 04/23/2016 1638    BNP    Component Value Date/Time   BNP 60.1 06/04/2017 0933    ProBNP No results found for: PROBNP  Imaging: No results found.   Assessment & Plan:   COPD (chronic obstructive pulmonary disease) ([facility]) - Acute COPD exacerbation with bronchitis symptoms - RX doxycyline 1 tab BID x 7 days and prednisone 20mg  x 5 days - I would not recommend Hycodan as pateint is on xanax and oxycontin - Advised continue mucinex and delsym cough syrup twice daily. Tessalone perles TID prn cough.  - Needs repeat CT chest w/o contrast per [facility] last office note in March  - Return if no improvement or symptoms worsen    Martyn Ehrich, NP 10/22/2018

## 2018-10-22 NOTE — Patient Instructions (Addendum)
  RX: Doxycyline x 1 week  Prednisone 20mg  x 5 days  Tessalone perles three times a day as needed for cough   Recommend: Delsym cough syrup twice daily  Continue mucinex twice daily   Orders: CT chest w/out contrast in 3-4 weeks   Follow-up: Call if symptoms do not improve or worsen

## 2018-11-15 DIAGNOSIS — J189 Pneumonia, unspecified organism: Secondary | ICD-10-CM | POA: Diagnosis not present

## 2018-11-15 DIAGNOSIS — J449 Chronic obstructive pulmonary disease, unspecified: Secondary | ICD-10-CM | POA: Diagnosis not present

## 2018-11-15 DIAGNOSIS — L0390 Cellulitis, unspecified: Secondary | ICD-10-CM | POA: Diagnosis not present

## 2018-11-15 DIAGNOSIS — L0291 Cutaneous abscess, unspecified: Secondary | ICD-10-CM | POA: Diagnosis not present

## 2018-11-17 DIAGNOSIS — R7303 Prediabetes: Secondary | ICD-10-CM | POA: Diagnosis not present

## 2018-11-17 DIAGNOSIS — Z0000 Encounter for general adult medical examination without abnormal findings: Secondary | ICD-10-CM | POA: Diagnosis not present

## 2018-11-17 DIAGNOSIS — R42 Dizziness and giddiness: Secondary | ICD-10-CM | POA: Diagnosis not present

## 2018-11-17 DIAGNOSIS — Z23 Encounter for immunization: Secondary | ICD-10-CM | POA: Diagnosis not present

## 2018-11-17 DIAGNOSIS — J449 Chronic obstructive pulmonary disease, unspecified: Secondary | ICD-10-CM | POA: Diagnosis not present

## 2018-11-17 DIAGNOSIS — I10 Essential (primary) hypertension: Secondary | ICD-10-CM | POA: Diagnosis not present

## 2018-11-17 DIAGNOSIS — N400 Enlarged prostate without lower urinary tract symptoms: Secondary | ICD-10-CM | POA: Diagnosis not present

## 2018-11-30 ENCOUNTER — Telehealth: Payer: Self-pay | Admitting: Internal Medicine

## 2018-11-30 NOTE — Telephone Encounter (Signed)
Received urgent staff message from Kandra Nicolas needing to know if pt's CT which is currently scheduled for 3/31 is urgent or if it can be rescheduled.  MR, please advise. Thanks!

## 2018-12-01 ENCOUNTER — Other Ambulatory Visit: Payer: 59

## 2018-12-01 NOTE — Telephone Encounter (Signed)
Staff message has been sent to [facility].   Will close this encounter.

## 2018-12-01 NOTE — Telephone Encounter (Signed)
FOr pandemic reasons can be moved to June 2020 or July 2020 along with office visit

## 2018-12-08 ENCOUNTER — Inpatient Hospital Stay: Admission: RE | Admit: 2018-12-08 | Payer: 59 | Source: Ambulatory Visit

## 2019-01-05 DIAGNOSIS — F341 Dysthymic disorder: Secondary | ICD-10-CM | POA: Diagnosis not present

## 2019-01-05 DIAGNOSIS — F411 Generalized anxiety disorder: Secondary | ICD-10-CM | POA: Diagnosis not present

## 2019-01-05 DIAGNOSIS — F39 Unspecified mood [affective] disorder: Secondary | ICD-10-CM | POA: Diagnosis not present

## 2019-01-05 DIAGNOSIS — G2111 Neuroleptic induced parkinsonism: Secondary | ICD-10-CM | POA: Diagnosis not present

## 2019-01-15 DIAGNOSIS — L0390 Cellulitis, unspecified: Secondary | ICD-10-CM | POA: Diagnosis not present

## 2019-01-15 DIAGNOSIS — J189 Pneumonia, unspecified organism: Secondary | ICD-10-CM | POA: Diagnosis not present

## 2019-01-15 DIAGNOSIS — L0291 Cutaneous abscess, unspecified: Secondary | ICD-10-CM | POA: Diagnosis not present

## 2019-01-15 DIAGNOSIS — J449 Chronic obstructive pulmonary disease, unspecified: Secondary | ICD-10-CM | POA: Diagnosis not present

## 2019-02-10 DIAGNOSIS — N400 Enlarged prostate without lower urinary tract symptoms: Secondary | ICD-10-CM | POA: Diagnosis not present

## 2019-02-10 DIAGNOSIS — R351 Nocturia: Secondary | ICD-10-CM | POA: Diagnosis not present

## 2019-02-15 DIAGNOSIS — J449 Chronic obstructive pulmonary disease, unspecified: Secondary | ICD-10-CM | POA: Diagnosis not present

## 2019-02-15 DIAGNOSIS — L0291 Cutaneous abscess, unspecified: Secondary | ICD-10-CM | POA: Diagnosis not present

## 2019-02-15 DIAGNOSIS — J189 Pneumonia, unspecified organism: Secondary | ICD-10-CM | POA: Diagnosis not present

## 2019-02-15 DIAGNOSIS — L0390 Cellulitis, unspecified: Secondary | ICD-10-CM | POA: Diagnosis not present

## 2019-03-02 DIAGNOSIS — M47812 Spondylosis without myelopathy or radiculopathy, cervical region: Secondary | ICD-10-CM | POA: Diagnosis not present

## 2019-03-02 DIAGNOSIS — M1711 Unilateral primary osteoarthritis, right knee: Secondary | ICD-10-CM | POA: Diagnosis not present

## 2019-03-17 DIAGNOSIS — L0390 Cellulitis, unspecified: Secondary | ICD-10-CM | POA: Diagnosis not present

## 2019-03-17 DIAGNOSIS — J189 Pneumonia, unspecified organism: Secondary | ICD-10-CM | POA: Diagnosis not present

## 2019-03-17 DIAGNOSIS — L0291 Cutaneous abscess, unspecified: Secondary | ICD-10-CM | POA: Diagnosis not present

## 2019-03-17 DIAGNOSIS — J449 Chronic obstructive pulmonary disease, unspecified: Secondary | ICD-10-CM | POA: Diagnosis not present

## 2019-03-18 DIAGNOSIS — J441 Chronic obstructive pulmonary disease with (acute) exacerbation: Secondary | ICD-10-CM | POA: Diagnosis not present

## 2019-03-18 DIAGNOSIS — J40 Bronchitis, not specified as acute or chronic: Secondary | ICD-10-CM | POA: Diagnosis not present

## 2019-03-22 ENCOUNTER — Other Ambulatory Visit: Payer: Self-pay | Admitting: Internal Medicine

## 2019-03-23 ENCOUNTER — Other Ambulatory Visit: Payer: Self-pay

## 2019-03-23 ENCOUNTER — Ambulatory Visit (INDEPENDENT_AMBULATORY_CARE_PROVIDER_SITE_OTHER)
Admission: RE | Admit: 2019-03-23 | Discharge: 2019-03-23 | Disposition: A | Discharge status: Home / Self Care | Payer: PPO | Source: Ambulatory Visit | Attending: Internal Medicine | Admitting: Internal Medicine

## 2019-03-23 DIAGNOSIS — I2510 Atherosclerotic heart disease of native coronary artery without angina pectoris: Secondary | ICD-10-CM | POA: Diagnosis not present

## 2019-03-23 DIAGNOSIS — I700 Atherosclerosis of aorta: Secondary | ICD-10-CM | POA: Diagnosis not present

## 2019-03-23 DIAGNOSIS — J439 Emphysema, unspecified: Secondary | ICD-10-CM

## 2019-04-07 DIAGNOSIS — F3341 Major depressive disorder, recurrent, in partial remission: Secondary | ICD-10-CM | POA: Diagnosis not present

## 2019-04-07 DIAGNOSIS — F411 Generalized anxiety disorder: Secondary | ICD-10-CM | POA: Diagnosis not present

## 2019-04-07 DIAGNOSIS — F341 Dysthymic disorder: Secondary | ICD-10-CM | POA: Diagnosis not present

## 2019-04-07 DIAGNOSIS — G2111 Neuroleptic induced parkinsonism: Secondary | ICD-10-CM | POA: Diagnosis not present

## 2019-04-17 DIAGNOSIS — L0390 Cellulitis, unspecified: Secondary | ICD-10-CM | POA: Diagnosis not present

## 2019-04-17 DIAGNOSIS — L0291 Cutaneous abscess, unspecified: Secondary | ICD-10-CM | POA: Diagnosis not present

## 2019-04-17 DIAGNOSIS — J449 Chronic obstructive pulmonary disease, unspecified: Secondary | ICD-10-CM | POA: Diagnosis not present

## 2019-04-17 DIAGNOSIS — J189 Pneumonia, unspecified organism: Secondary | ICD-10-CM | POA: Diagnosis not present

## 2019-05-07 DIAGNOSIS — L089 Local infection of the skin and subcutaneous tissue, unspecified: Secondary | ICD-10-CM | POA: Diagnosis not present

## 2019-05-18 DIAGNOSIS — J449 Chronic obstructive pulmonary disease, unspecified: Secondary | ICD-10-CM | POA: Diagnosis not present

## 2019-05-18 DIAGNOSIS — L0390 Cellulitis, unspecified: Secondary | ICD-10-CM | POA: Diagnosis not present

## 2019-05-18 DIAGNOSIS — J189 Pneumonia, unspecified organism: Secondary | ICD-10-CM | POA: Diagnosis not present

## 2019-05-18 DIAGNOSIS — L0291 Cutaneous abscess, unspecified: Secondary | ICD-10-CM | POA: Diagnosis not present

## 2019-05-20 DIAGNOSIS — G8929 Other chronic pain: Secondary | ICD-10-CM | POA: Diagnosis not present

## 2019-05-20 DIAGNOSIS — I10 Essential (primary) hypertension: Secondary | ICD-10-CM | POA: Diagnosis not present

## 2019-05-20 DIAGNOSIS — M1712 Unilateral primary osteoarthritis, left knee: Secondary | ICD-10-CM | POA: Diagnosis not present

## 2019-05-20 DIAGNOSIS — M1711 Unilateral primary osteoarthritis, right knee: Secondary | ICD-10-CM | POA: Diagnosis not present

## 2019-05-20 DIAGNOSIS — R238 Other skin changes: Secondary | ICD-10-CM | POA: Diagnosis not present

## 2019-05-20 DIAGNOSIS — R7303 Prediabetes: Secondary | ICD-10-CM | POA: Diagnosis not present

## 2019-05-20 DIAGNOSIS — M25562 Pain in left knee: Secondary | ICD-10-CM | POA: Diagnosis not present

## 2019-05-20 DIAGNOSIS — M25561 Pain in right knee: Secondary | ICD-10-CM | POA: Diagnosis not present

## 2019-05-20 DIAGNOSIS — Z23 Encounter for immunization: Secondary | ICD-10-CM | POA: Diagnosis not present

## 2019-05-27 ENCOUNTER — Encounter: Payer: Self-pay | Admitting: Internal Medicine

## 2019-05-27 ENCOUNTER — Ambulatory Visit (INDEPENDENT_AMBULATORY_CARE_PROVIDER_SITE_OTHER): Payer: PPO | Admitting: Internal Medicine

## 2019-05-27 ENCOUNTER — Other Ambulatory Visit: Payer: Self-pay

## 2019-05-27 VITALS — BP 146/88 | HR 63 | Temp 97.2°F | Ht 71.0 in | Wt 145.4 lb

## 2019-05-27 DIAGNOSIS — J439 Emphysema, unspecified: Secondary | ICD-10-CM

## 2019-05-27 DIAGNOSIS — R5381 Other malaise: Secondary | ICD-10-CM | POA: Diagnosis not present

## 2019-05-27 DIAGNOSIS — R5383 Other fatigue: Secondary | ICD-10-CM

## 2019-05-27 DIAGNOSIS — R0609 Other forms of dyspnea: Secondary | ICD-10-CM | POA: Diagnosis not present

## 2019-05-27 NOTE — Patient Instructions (Addendum)
ICD-10-CM   1. Pulmonary emphysema, unspecified emphysema type ([place]) J43.9   2. Physical deconditioning R53.81   3. Dyspnea on exertion R06.09   4. Other fatigue R53.83     Currently stable  You seem more deconditioned without rehab;  CT chest July 2020 with stable UL scarring and emphysema - no nodules  Glad you had flu shot  Plan CMA to document you had flu shot Try to get back into an exercise program Continue spiriva daily with albuterol as needed Self moinitor oxygen levels with exertion  Followup Regular lung clinic in 9 months   - CAT score and simple walk test at followup

## 2019-05-27 NOTE — Progress Notes (Signed)
PCP Kathyrn Lass, MD   HPI   IOV  08/22/2017   Chief Complaint  Patient presents with  . Advice Only    Referred by Dr. Lennette Bihari Via for COPD.  Pt does have complaints of SOB. Pt had pna and was hospitalized 10/5-10/26    [name]. [name] is a 68 year old male who is been referred by primary care physician for COPD.  History is obtained from him, talking to his wife and review of the outside chart from primary care physician and past medical chart in the electronic health record.  He carries a diagnosis of COPD for many years.  He is always maintained himself on albuterol as needed.  He quit smoking 16 years ago.  Then all of a sudden in September 2018 he was hospitalized for rhinovirus and associated pneumococcal bacteremia with pneumonia.  He spent over a month in the hospital including several weeks in the intensive care unit being treated with BiPAP but not intubated.  He spent time between the critical care service and also the hospitalist service.  He ended up getting discharged in October 2018 to a long-term acute care facility and from there to an inpatient rehab facility.  He has now been home for 6 weeks.  At this point in time his COPD CAT score is 12.  He feels improved and back to baseline but according to his wife he still far away from baseline.  Overall fatigue and shortness of breath with exertion or major issues.  This is documented in the COPD CAD score.  His current COPD medications only albuterol as needed.  He does not have pulmonary function test in the past.  Review of the chart also shows he had acute diastolic heart failure while in the hospital and he was diuresed.  A few days ago primary care physician is stopped his Lasix and his told the family that I could restart it at my discretion.  His last set of lab work was in November 2018 with primary care physician but in October 2018 in our health record system he had normal creatinine but anemia of chronic  disease/critical illness  His last chest x-ray that I personally visualized was June 17, 2017 that still showed pulmonary infiltrates.  According to the family he had another chest x-ray in November 2018 that still shows pulmonary infiltrates.  He has not had follow-up imaging since then.   OV 10/01/2017  Chief Complaint  Patient presents with  . Follow-up    PFT done today.  CT scan done 09/19/17.  Pt states he has been doing much better since last visit. States he has mild SOB with exertion. Pt's wife states he used his O2 last night and some this morning which pt stated it was to prepare for today's visit.   Follow-up emphysema and recent hospitalization for pneumococcal pneumonia  Present his wife.  In the interim he has been on Spiriva.  Smoking is in remission.  He continues to improve.  COPD CAT score is dropped to 6.  He feels great.  He is here to start pulmonary rehabilitation.  He had follow-up CT scan of the chest in January 2019 and when compared to his pneumonia in September 2018 his or pulmonary infiltrates of all clear except he has bilateral upper lobe scarring.  He does have evidence of emphysema on a CT chest.  His pulmonary function test is normal except for reduction in DLCO at 40%.  He continues to use  oxygen at night.  He is compliant with his Spiriva.  His blood work is all normal.  His wife wants FMLA paperwork filled because she has to take him to rehab.  They are wondering about the rehab referral.    OV 02/17/2018  Chief Complaint  Patient presents with  . Follow-up    36-month follow up visit. Pt states he has been doing well and denies any complaints.   Follow-up pulmonary emphysema with isolated reduction in diffusion capacity and history of smoking. CT Jan 2019 with UL fibrosis. Sept 2018 hospitalization for RV and pneumococcal pneumonia  Last seen January 2019.  After that he had COPD exacerbation and seen acutely and treated as an outpatient.  This was in  March 2019 when he saw my colleague Dr. Halford Chessman.  Chest x-ray at that time was clear.  Currently he is feeling well with Spiriva.  He denies any smoking.  Wife also states he does not smoke anymore.  COPD CAT score is 9 which is close to his baseline.  He stopped attending pulmonary rehabilitation because of travel and his exacerbation and he wants to rejoin.  Given his emphysema and age of 20 he might be a candidate for lung cancer screening program with a nurse intake smoking history seems inadequate for that.  We will have to reassess his smoking history in detail.      OV 10/01/2018  Subjective:  Patient ID: Travis Salinas, male , DOB: [birth year] , age 51 y.o. , MRN: [id number] , ADDRESS: [place] [address]   10/01/2018 -   Chief Complaint  Patient presents with  . Follow-up    Pt states he has had no energy the last 3 months and states his breathing has been worse. Pt also states it feels like someone is sitting on his chest.    Follow-up pulmonary emphysema (alpha 1 MS) with isolated reduction in diffusion capacity and history of smoking. CT Jan 2019 with UL fibrosis. Sept 2018 hospitalization for RV and pneumococcal pneumonia    HPI Bo Teicher 67 y.o. -last visit June 2019.  He presents with his wife.  He was attending pulmonary rehabilitation till November 2019.  After that he is not attending.  Now he is reporting more fatigue.  He states he lives on the third floor with a roommate [?  He does not live with his wife].  However he does state that he can walk up all 3 floors without stopping.  His COPD CAT score seems significantly worse than even in the past when he had pneumonia.  I am not sure he is answered this correctly.  Nevertheless fatigue and shortness of breath with exertion seem to be a dominant thing.  His wife feels he needs to get back to exercise.  He insists he does not smoke.  Wife also does not he does not smoke.  Review of his immunization  records sho that he could benefit from pneumonia vaccine.  He does not recollect having had one.   OV 05/27/2019  Subjective:  Patient ID: Travis Salinas, male , DOB: 01-05-1951 , age 72 y.o. , MRN: [id number] , ADDRESS: [place] [address]   05/27/2019 -   Chief Complaint  Patient presents with  . Pulmonary emphysema, unspecified emphysema type    Feels breathing is the same as at last visit in February. Medications are still working.     Follow-up pulmonary emphysema (alpha 1 MS)  with isolated reduction in diffusion capacity and history of smoking. CT Jan 2019 with UL fibrosis. Sept 2018 hospitalization for RV and pneumococcal pneumonia   HPI Waldo Brucato 68 y.o. -presents for follow-up of the above issues.  He says he is doing well.  He still has dyspnea on exertion when he climbs 3 flights of stairs to his apartment.  But he says he does not desaturate below 94%.  He is not doing pulmonary rehabilitation.  He is doing Spiriva and DuoNeb.  He says he has had a flu shot for this 2020-2021 season.  His COPD CAT score is higher but I am not so sure he answered it reliably.  He had a CT scan of the chest in July which is stable.      CAT COPD Symptom & Quality of Life Score (GSK trademark) 0 is no burden. 5 is highest burden 08/22/2017  1 10/01/2017 spiriva and time 02/17/2018  10/01/2018   Never Cough -> Cough all the time 0 0 0 5  No phlegm in chest -> Chest is full of phlegm 1 0 1 0  No chest tightness -> Chest feels very tight 4 0 2 5  No dyspnea for 1 flight stairs/hill -> Very dyspneic for 1 flight of stairs 2 2 0 5  No limitations for ADL at home -> Very limited with ADL at home 0 1 0 5  Confident leaving home -> Not at all confident leaving home 0 0 0 0  Sleep soundly -> Do not sleep soundly because of lung condition 4 0 3 0  Lots of Energy -> No energy at all  3 3 5   TOTAL Score (max 40)  12 6 9 25      IMPRESSION: 1. Moderate  centrilobular and paraseptal emphysema with mild diffuse bronchial wall thickening, compatible with the reported history of COPD. 2. Upper lobe predominant patchy reticulation and parenchymal banding, unchanged, compatible with nonspecific smoking related fibrosis versus postinfectious/postinflammatory scarring. Findings are not suggestive of interstitial lung disease. No acute pulmonary disease. 3. Three-vessel coronary atherosclerosis.  Aortic Atherosclerosis (ICD10-I70.0) and Emphysema (ICD10-J43.9).   Electronically Signed   By: Ilona Sorrel M.D.   On: 03/24/2019 10:02  ROS - per HPI     has a past medical history of Arthritis, Cervical disc herniation, COPD (chronic obstructive pulmonary disease) ([place]), Headache, Hepatitis C, and Hypertension.   reports that he quit smoking about 18 years ago. His smoking use included cigarettes. He has a 32.00 pack-year smoking history. He has never used smokeless tobacco.  Past Surgical History:  Procedure Laterality Date  . APPLICATION OF A-CELL OF EXTREMITY Left 12/05/2016   Procedure: APPLICATION OF A-CELL OF EXTREMITY;  Surgeon: Loel Lofty Dillingham, DO;  Location: [facility];  Service: Plastics;  Laterality: Left;  . APPLICATION OF WOUND VAC Left 12/05/2016   Procedure: APPLICATION OF WOUND VAC;  Surgeon: Loel Lofty Dillingham, DO;  Location: [facility];  Service: Plastics;  Laterality: Left;  . I&D EXTREMITY Left 11/03/2016   Procedure: IRRIGATION AND DEBRIDEMENT EXTREMITY;  Surgeon: Leandrew Koyanagi, MD;  Location: [facility];  Service: Orthopedics;  Laterality: Left;  . I&D EXTREMITY Left 12/05/2016   Procedure: IRRIGATION AND DEBRIDEMENT EXTREMITY;  Surgeon: Loel Lofty Dillingham, DO;  Location: [facility];  Service: Plastics;  Laterality: Left;  . INCISION AND DRAINAGE ABSCESS Right 11/12/2013   Procedure: INCISION AND DRAINAGE AND OPEN PACKING OF RIGHT CALF  ABSCESS;  Surgeon: Earnstine Regal, MD;  Location: [facility];  Service: General;  Laterality: Right;  .  INCISION AND DRAINAGE OF WOUND Left 01/29/2017   Procedure: IRRIGATION AND DEBRIDEMENT OF LEFT LEG WOUND WITH ABRA PLACEMENT AND PLACEMENT OF WOUND VAC;  Surgeon: Wallace Going, DO;  Location: [facility];  Service: Plastics;  Laterality: Left;  . KNEE SURGERY      Allergies  Allergen Reactions  . Propoxyphene Nausea And Vomiting  . Amoxicillin Nausea And Vomiting  . Ciprofloxacin Nausea Only  . Depakote [Divalproex Sodium] Other (See Comments)    hallucinations    Immunization History  Administered Date(s) Administered  . Influenza, High Dose Seasonal PF 06/23/2017, 05/13/2018  . Influenza, Seasonal, Injecte, Preservative Fre 07/10/2016  . Pneumococcal Conjugate-13 10/01/2018  . Tdap 11/05/2013  . Zoster Recombinat (Shingrix) 05/06/2018, 07/09/2018    Family History  Problem Relation Age of Onset  . Heart disease Mother   . Stroke Mother   . Heart disease Father   . Stroke Father      Current Outpatient Medications:  .  acetaminophen (TYLENOL) 325 MG tablet, Take 2 tablets (650 mg total) by mouth every 6 (six) hours as needed for mild pain (or Fever >/= 101)., Disp: , Rfl:  .  albuterol (PROVENTIL) (2.5 MG/3ML) 0.083% nebulizer solution, Take 3 mLs (2.5 mg total) by nebulization every 2 (two) hours as needed for wheezing or shortness of breath., Disp: 75 mL, Rfl: 12 .  amantadine (SYMMETREL) 100 MG capsule, Take 50 mg by mouth 2 (two) times daily., Disp: , Rfl:  .  ARIPiprazole (ABILIFY) 15 MG tablet, 10 mg. 7/29 dosage decreased to 10 mg, Disp: , Rfl: 3 .  atenolol (TENORMIN) 25 MG tablet, Take 12.5 mg by mouth daily., Disp: , Rfl:  .  benzonatate (TESSALON) 100 MG capsule, Take 2 capsules (200 mg total) by mouth 3 (three) times daily., Disp: 30 capsule, Rfl: 1 .  clonazePAM (KLONOPIN) 0.5 MG tablet, Take 1 tablet (0.5 mg total) by mouth 2 (two) times daily. (Patient taking differently: Take 1 mg by mouth 2 (two) times daily. ), Disp: 30 tablet, Rfl: 0 .  feeding  supplement, ENSURE ENLIVE, (ENSURE ENLIVE) LIQD, Take 237 mLs by mouth 2 (two) times daily between meals., Disp: 237 mL, Rfl: 12 .  FLUoxetine (PROZAC) 10 MG tablet, Take 10 mg by mouth daily., Disp: , Rfl:  .  fluticasone (FLONASE) 50 MCG/ACT nasal spray, Place 1 spray into both nostrils every morning., Disp: , Rfl: 0 .  ibuprofen (ADVIL,MOTRIN) 600 MG tablet, Take 1 tablet (600 mg total) by mouth 4 (four) times daily., Disp: 30 tablet, Rfl: 0 .  ipratropium-albuterol (DUONEB) 0.5-2.5 (3) MG/3ML SOLN, Take 3 mLs by nebulization 3 (three) times daily. (Patient taking differently: Take 3 mLs by nebulization 3 (three) times daily as needed. ), Disp: 360 mL, Rfl:  .  lamoTRIgine (LAMICTAL) 200 MG tablet, Take 1 tablet (200 mg total) by mouth every morning., Disp: , Rfl:  .  silodosin (RAPAFLO) 8 MG CAPS capsule, Take 8 mg daily with breakfast by mouth., Disp: , Rfl:  .  SPIRIVA RESPIMAT 2.5 MCG/ACT AERS, INHALE 2 PUFFS INTO THE LUNGS DAILY., Disp: 4 g, Rfl: 5 .  OXYCONTIN 40 MG 12 hr tablet, Take 1 tablet by mouth as needed., Disp: , Rfl:       Objective:   Vitals:   05/27/19 1212  BP: (!) 146/88  Pulse: 63  Temp: (!) 97.2 F (36.2 C)  SpO2: 96%  Weight: 145 lb 6.4 oz (66 kg)  Height: 5\' 11"  (1.803 m)    Estimated body mass index is 20.28 kg/m as calculated from the following:   Height as of this encounter: 5\' 11"  (1.803 m).   Weight as of this encounter: 145 lb 6.4 oz (66 kg).  @WEIGHTCHANGE @  [employer]   05/27/19 1212  Weight: 145 lb 6.4 oz (66 kg)     Physical Exam  General Appearance:    Alert, cooperative, no distress, appears stated age - older , Deconditioned looking - yes , OBESE  - no, Sitting on Wheelchair -  no  Head:    Normocephalic, without obvious abnormality, atraumatic  Eyes:    PERRL, conjunctiva/corneas clear,  Ears:    Normal TM's and external ear canals, both ears  Nose:   Nares normal, septum midline, mucosa normal, no drainage    or sinus  tenderness. OXYGEN ON  - no . Patient is @ ra   Throat: MASK  Neck:   Supple, symmetrical, trachea midline, no adenopathy;    thyroid:  no enlargement/tenderness/nodules; no carotid   bruit or JVD  Back:     Symmetric, no curvature, ROM normal, no CVA tenderness  Lungs:     Distress - no , Wheeze no, Barrell Chest - no, Purse lip breathing - no, Crackles - no   Chest Wall:    No tenderness or deformity.    Heart:    Regular rate and rhythm, S1 and S2 normal, no rub   or gallop, Murmur - no  Breast Exam:    NOT DONE  Abdomen:     Soft, non-tender, bowel sounds active all four quadrants,    no masses, no organomegaly. Visceral obesity - no  Genitalia:   NOT DONE  Rectal:   NOT DONE  Extremities:   Extremities - normal, Has Cane - no, Clubbing - no, Edema - no  Pulses:   2+ and symmetric all extremities  Skin:   Stigmata of Connective Tissue Disease - no  Lymph nodes:   Cervical, supraclavicular, and axillary nodes normal  Psychiatric:  Neurologic:   Pleasant - yes, Anxious - no, Flat affect - mild yes  CAm-ICU - neg, Alert and Oriented x 3 - yes, Moves all 4s - yes, Speech - normal, Cognition - intact           Assessment:       ICD-10-CM   1. Pulmonary emphysema, unspecified emphysema type ([place])  J43.9   2. Physical deconditioning  R53.81   3. Dyspnea on exertion  R06.09   4. Other fatigue  R53.83        Plan:     Patient Instructions     ICD-10-CM   1. Pulmonary emphysema, unspecified emphysema type ([place]) J43.9   2. Physical deconditioning R53.81   3. Dyspnea on exertion R06.09   4. Other fatigue R53.83     Currently stable  You seem more deconditioned without rehab;  CT chest July 2020 with stable UL scarring and emphysema - no nodules  Glad you had flu shot  Plan CMA to document you had flu shot Try to get back into an exercise program Continue spiriva daily with albuterol as needed Self moinitor oxygen levels with exertion  Followup Regular lung  clinic in 9 months   - CAT score and simple walk test at followup     SIGNATURE    Dr. Brand Males, M.D., F.C.C.P,  Pulmonary and Critical Care Medicine Staff Physician, [facility] Director -  Interstitial Lung Disease  Program  Pulmonary [place] at [place], [place], [address]  Pager: [phone], If no answer or between  15:00h - 7:00h: call [phone] Telephone: [phone]  1:06 PM 05/27/2019

## 2019-05-28 ENCOUNTER — Telehealth: Payer: Self-pay | Admitting: General Surgery

## 2019-05-28 NOTE — Telephone Encounter (Signed)
LVM for patient. He was seen in clinic 05/27/19 and we need to document that he had his flu shot.  Advised in the message he can call or send the information as a message in [place].

## 2019-05-31 ENCOUNTER — Telehealth: Payer: Self-pay | Admitting: Internal Medicine

## 2019-05-31 NOTE — Telephone Encounter (Signed)
Call returned to patient wife Stanton Kidney (dpr), she wanted to make [place] aware this patient received the high dose flu shot at [facility] office september 10th. I made her aware I would get this patient's immunization updated. Voiced understanding. Nothing further needed at this time.

## 2019-06-01 DIAGNOSIS — M1711 Unilateral primary osteoarthritis, right knee: Secondary | ICD-10-CM | POA: Diagnosis not present

## 2019-06-01 DIAGNOSIS — S2232XA Fracture of one rib, left side, initial encounter for closed fracture: Secondary | ICD-10-CM | POA: Diagnosis not present

## 2019-09-20 ENCOUNTER — Telehealth: Payer: Self-pay | Admitting: Internal Medicine

## 2019-09-20 NOTE — Telephone Encounter (Signed)
Rodena Piety please advise if you have this CMN or if we need to re-request this.  Thanks!

## 2019-09-21 DIAGNOSIS — M170 Bilateral primary osteoarthritis of knee: Secondary | ICD-10-CM | POA: Diagnosis not present

## 2019-09-21 DIAGNOSIS — M545 Low back pain: Secondary | ICD-10-CM | POA: Diagnosis not present

## 2019-09-21 DIAGNOSIS — M542 Cervicalgia: Secondary | ICD-10-CM | POA: Diagnosis not present

## 2019-09-22 NOTE — Telephone Encounter (Signed)
I have not received a CMN on this patient. As far as I can tell we have never order anything for him.  There are no previous CMNs that we have signed for him. When he was sent to [place] in 08/2017 he was already on 02. MR's note states to continue 02 at night. Where are the faxing the CMN? Who ever order the 02 should be signing the CMN

## 2019-09-23 NOTE — Telephone Encounter (Signed)
Called and spoke with [employer]  They are going to refax the forms  He could not say what number they were sent to before  I gave them the up front fax number  Will await fax

## 2019-09-24 NOTE — Telephone Encounter (Signed)
Rodena Piety- did you receive this yet?

## 2019-09-24 NOTE — Telephone Encounter (Signed)
Travis Salinas calling back [phone] please call them back

## 2019-09-24 NOTE — Telephone Encounter (Signed)
Called and spoke with pt's spouse [name] letting her know that we have contacted [employer] to have them fax [place] the forms. Stated to her that we would call her once all has been taken care of for pt and she verbalized understanding.  Routing to [place] as we follow up to see if forms have been faxed from [place].

## 2019-09-28 NOTE — Telephone Encounter (Signed)
Spoke with Rodena Piety. She has not received this. Called [employer] and spoke with rep. She is re-faxing this to the front fax to Anita's attn. She also states the pt's account is on hold due to not making a payment. She states the pt will need to call in regarding this. ATC [name], VM box is full. WCB.   Will also forward to [place] to look out for CMN.

## 2019-10-05 DIAGNOSIS — F3341 Major depressive disorder, recurrent, in partial remission: Secondary | ICD-10-CM | POA: Diagnosis not present

## 2019-10-05 DIAGNOSIS — F411 Generalized anxiety disorder: Secondary | ICD-10-CM | POA: Diagnosis not present

## 2019-10-05 DIAGNOSIS — F341 Dysthymic disorder: Secondary | ICD-10-CM | POA: Diagnosis not present

## 2019-10-05 NOTE — Telephone Encounter (Signed)
Travis Salinas, have you seen this CMN?

## 2019-10-05 NOTE — Telephone Encounter (Signed)
No I haven't seen anything on this patient no one knows where they are faxing the forms to

## 2019-10-08 NOTE — Telephone Encounter (Signed)
Has this CMN been located?

## 2019-10-08 NOTE — Telephone Encounter (Signed)
No I still haven't seen anything for this patient

## 2019-10-18 ENCOUNTER — Telehealth: Payer: Self-pay | Admitting: Internal Medicine

## 2019-10-18 NOTE — Telephone Encounter (Signed)
I still have not receive anything for this patient

## 2019-10-18 NOTE — Telephone Encounter (Signed)
Raquel Sarna - do you have this form or do we need to request from [employer]? Thanks.

## 2019-10-18 NOTE — Telephone Encounter (Signed)
I do not have a form from [employer]. Travis Salinas, please advise if you have a form from [employer] on pt?

## 2019-10-19 NOTE — Telephone Encounter (Signed)
Spoke with pt's wife and she states the patient doesn't use the the oxygen anymore and it was ordered when he had pneumonia back in 2018. I called [employer] and they stated the patient needs the appointment with updated walk and office notes to have the oxygen renewed. I made an appt with Geraldo Pitter on 09/20/2019 at 11:30. Nothing further is needed.

## 2019-10-19 NOTE — Telephone Encounter (Signed)
Pt's wife [name] caling back to state that pt RARELY  uses O2 and that she will have all O2/equipment removed from the house and if pt needs in the future will get then. CB# [phone]

## 2019-10-20 ENCOUNTER — Ambulatory Visit: Payer: PPO | Admitting: Primary Care

## 2019-10-21 ENCOUNTER — Encounter: Payer: Self-pay | Admitting: Primary Care

## 2019-10-21 ENCOUNTER — Ambulatory Visit: Payer: Medicare Other | Admitting: Primary Care

## 2019-10-21 ENCOUNTER — Other Ambulatory Visit: Payer: Self-pay

## 2019-10-21 VITALS — BP 118/60 | HR 64 | Temp 97.6°F | Ht 71.0 in | Wt 140.2 lb

## 2019-10-21 DIAGNOSIS — J449 Chronic obstructive pulmonary disease, unspecified: Secondary | ICD-10-CM | POA: Diagnosis not present

## 2019-10-21 DIAGNOSIS — J9601 Acute respiratory failure with hypoxia: Secondary | ICD-10-CM

## 2019-10-21 MED ORDER — IPRATROPIUM-ALBUTEROL 0.5-2.5 (3) MG/3ML IN SOLN: 3.0000 mL | Freq: Three times a day (TID) | RESPIRATORY_TRACT | Status: DC

## 2019-10-21 MED ORDER — ANORO ELLIPTA 62.5-25 MCG/INH IN AEPB: 1.0000 | INHALATION_SPRAY | Freq: Every day | RESPIRATORY_TRACT | 0 refills | Status: DC

## 2019-10-21 NOTE — Patient Instructions (Signed)
Orders: Discontinue oxygen Renew nebulizer order  Recommendations: Trial Anoro 1 puff once daily in moring (this replaced Spiriva) Use albuterol/ or Duoneb only as needed - 2 puffs every 6 hours for breakthrough shortness of breath/wheezing  Follow-up: 6 months with Ramaswamy or sooner if needed  *Let us know if Anoro was beneficial and will send in prescription in March    COPD and Physical Activity Chronic obstructive pulmonary disease (COPD) is a long-term (chronic) condition that affects the lungs. COPD is a general term that can be used to describe many different lung problems that cause lung swelling (inflammation) and limit airflow, including chronic bronchitis and emphysema. The main symptom of COPD is shortness of breath, which makes it harder to do even simple tasks. This can also make it harder to exercise and be active. Talk with your health care provider about treatments to help you breathe better and actions you can take to prevent breathing problems during physical activity. What are the benefits of exercising with COPD? Exercising regularly is an important part of a healthy lifestyle. You can still exercise and do physical activities even though you have COPD. Exercise and physical activity improve your shortness of breath by increasing blood flow (circulation). This causes your heart to pump more oxygen through your body. Moderate exercise can improve your:  Oxygen use.  Energy level.  Shortness of breath.  Strength in your breathing muscles.  Heart health.  Sleep.  Self-esteem and feelings of self-worth.  Depression, stress, and anxiety levels. Exercise can benefit everyone with COPD. The severity of your disease may affect how hard you can exercise, especially at first, but everyone can benefit. Talk with your health care provider about how much exercise is safe for you, and which activities and exercises are safe for you. What actions can I take to prevent  breathing problems during physical activity?  Sign up for a pulmonary rehabilitation program. This type of program may include: ? Education about lung diseases. ? Exercise classes that teach you how to exercise and be more active while improving your breathing. This usually involves:  Exercise using your lower extremities, such as a stationary bicycle.  About 30 minutes of exercise, 2 to 5 times per week, for 6 to 12 weeks  Strength training, such as push ups or leg lifts. ? Nutrition education. ? Group classes in which you can talk with others who also have COPD and learn ways to manage stress.  If you use an oxygen tank, you should use it while you exercise. Work with your health care provider to adjust your oxygen for your physical activity. Your resting flow rate is different from your flow rate during physical activity.  While you are exercising: ? Take slow breaths. ? Pace yourself and do not try to go too fast. ? Purse your lips while breathing out. Pursing your lips is similar to a kissing or whistling position. ? If doing exercise that uses a quick burst of effort, such as weight lifting:  Breathe in before starting the exercise.  Breathe out during the hardest part of the exercise (such as raising the weights). Where to find support You can find support for exercising with COPD from:  Your health care provider.  A pulmonary rehabilitation program.  Your local health department or community health programs.  Support groups, online or in-person. Your health care provider may be able to recommend support groups. Where to find more information You can find more information about exercising with COPD from:  American Lung Association: ClassInsider.se.  COPD Foundation: https://www.rivera.net/. Contact a health care provider if:  Your symptoms get worse.  You have chest pain.  You have nausea.  You have a fever.  You have trouble talking or catching your breath.  You want  to start a new exercise program or a new activity. Summary  COPD is a general term that can be used to describe many different lung problems that cause lung swelling (inflammation) and limit airflow. This includes chronic bronchitis and emphysema.  Exercise and physical activity improve your shortness of breath by increasing blood flow (circulation). This causes your heart to provide more oxygen to your body.  Contact your health care provider before starting any exercise program or new activity. Ask your health care provider what exercises and activities are safe for you. This information is not intended to replace advice given to you by your health care provider. Make sure you discuss any questions you have with your health care provider. Document Revised: 12/16/2018 Document Reviewed: 09/18/2017 Elsevier Patient Education  2020 [employer].

## 2019-10-21 NOTE — Assessment & Plan Note (Addendum)
-  Stable overall, no recent exacerbations. CAT score 17  -Using SABA rescue inhaler multiple times throughout the week -Plan trial Anoro 1 puff daily (sample given, if beneficial will send in Rx) -Continue Albuterol hfa OR Duoneb q 6 hours prn breakthrough sob/wheezing  -Receiving second Covid vaccine tomorrow

## 2019-10-21 NOTE — Addendum Note (Signed)
Addended by: Martyn Ehrich on: 10/21/2019 01:26 PM   Modules accepted: Orders

## 2019-10-21 NOTE — Progress Notes (Addendum)
@Patient  ID: Travis Salinas, male    DOB: 12-15-1950, 69 y.o.   MRN: [id number]  Chief Complaint  Patient presents with  . Follow-up    sob when walking up stairs, has 3 flights at home,has to sit down.    Referring provider: Kathyrn Lass, MD  Brief Summary: Pulmonary emphysema (alpha 1 MS) with isolated reduction in diffusion capacity and history of smoking. CT Jan 2019 with UL fibrosis. Sept 2018 hospitalization for RV and pneumococcal pneumonia  HPI: 69 year old male, former smoker quit 2002 (32 pack year hx). PMH significant for COPD, pulmonary emphysema, pneumonia, acute respiratory failure, hypertension, chronic hep C, liver fibrosis, IVDU/substance abuse. Patient of Dr. Chase Caller, last seen on 05/27/19. CT chest in July stable upper lobe scarring and emphysema, no nodules. Pulmonary function test normal except for reduced DLCO 40%. Compliant with Spiriva. Completed pulmonary rehab.   10/21/2019 Patient presents today for follow-up office visit/qualifying walk for oxygen.  Accompanied by his wife.  Family member states that they got a bill for oxygen from [facility] and they are wondering if he he still needs to use oxygen.  Patient reports dyspnea with activity but recovers with rest.  Reports lowest O2 level 89% on room air over the last month.  He is compliant with Spiriva daily, however, patient's wife states that he uses his albuterol rescue inhaler frequently.  Patient states that he uses albuterol 5 days out of the week. Wife states that patient has trouble getting full dose of Spiriva respimat, he will sometimes turn/push button before inhaler is in his mouth. Needs refill of DuoNeb's.  Denies acute symptoms of chest tightness wheezing or cough.  He will be receiving his second Covid vaccine tomorrow. CAT score 17 today.   Allergies  Allergen Reactions  . Propoxyphene Nausea And Vomiting  . Amoxicillin Nausea And Vomiting  . Ciprofloxacin Nausea Only  . Depakote [Divalproex  Sodium] Other (See Comments)    hallucinations    Immunization History  Administered Date(s) Administered  . Influenza, High Dose Seasonal PF 06/23/2017, 05/13/2018, 05/20/2019  . Influenza, Seasonal, Injecte, Preservative Fre 07/10/2016  . PFIZER SARS-COV-2 Vaccination 10/01/2019  . Pneumococcal Conjugate-13 10/01/2018  . Tdap 11/05/2013  . Zoster Recombinat (Shingrix) 05/06/2018, 07/09/2018    Past Medical History:  Diagnosis Date  . Arthritis   . Cervical disc herniation   . COPD (chronic obstructive pulmonary disease) ([place])   . Headache    " due to antibiotics"  . Hepatitis C   . Hypertension     Tobacco History: Social History   Tobacco Use  Smoking Status Former Smoker  . Packs/day: 1.00  . Years: 32.00  . Pack years: 32.00  . Types: Cigarettes  . Quit date: 2002  . Years since quitting: 19.1  Smokeless Tobacco Never Used   Counseling given: Not Answered   Outpatient Medications Prior to Visit  Medication Sig Dispense Refill  . acetaminophen (TYLENOL) 325 MG tablet Take 2 tablets (650 mg total) by mouth every 6 (six) hours as needed for mild pain (or Fever >/= 101).    Marland Kitchen albuterol (PROVENTIL) (2.5 MG/3ML) 0.083% nebulizer solution Take 3 mLs (2.5 mg total) by nebulization every 2 (two) hours as needed for wheezing or shortness of breath. 75 mL 12  . amantadine (SYMMETREL) 100 MG capsule Take 50 mg by mouth 2 (two) times daily.    . ARIPiprazole (ABILIFY) 15 MG tablet 10 mg. 7/29 dosage decreased to 10 mg  3  . atenolol (  TENORMIN) 25 MG tablet Take 12.5 mg by mouth daily.    . clonazePAM (KLONOPIN) 0.5 MG tablet Take 1 tablet (0.5 mg total) by mouth 2 (two) times daily. (Patient taking differently: Take 1 mg by mouth 2 (two) times daily. ) 30 tablet 0  . feeding supplement, ENSURE ENLIVE, (ENSURE ENLIVE) LIQD Take 237 mLs by mouth 2 (two) times daily between meals. 237 mL 12  . FLUoxetine (PROZAC) 10 MG tablet Take 10 mg by mouth daily.    . fluticasone  (FLONASE) 50 MCG/ACT nasal spray Place 1 spray into both nostrils every morning.  0  . ibuprofen (ADVIL,MOTRIN) 600 MG tablet Take 1 tablet (600 mg total) by mouth 4 (four) times daily. 30 tablet 0  . ipratropium-albuterol (DUONEB) 0.5-2.5 (3) MG/3ML SOLN Take 3 mLs by nebulization 3 (three) times daily. (Patient taking differently: Take 3 mLs by nebulization 3 (three) times daily as needed. ) 360 mL   . lamoTRIgine (LAMICTAL) 200 MG tablet Take 1 tablet (200 mg total) by mouth every morning.    . OXYCONTIN 40 MG 12 hr tablet Take 1 tablet by mouth as needed.    . silodosin (RAPAFLO) 8 MG CAPS capsule Take 8 mg daily with breakfast by mouth.    . SPIRIVA RESPIMAT 2.5 MCG/ACT AERS INHALE 2 PUFFS INTO THE LUNGS DAILY. 4 g 5  . benzonatate (TESSALON) 100 MG capsule Take 2 capsules (200 mg total) by mouth 3 (three) times daily. (Patient not taking: Reported on 10/21/2019) 30 capsule 1   No facility-administered medications prior to visit.   Review of Systems Review of Systems  Constitutional: Negative.   Respiratory: Negative for cough, shortness of breath and wheezing.   Cardiovascular: Negative.    Physical Exam BP 118/60 (BP Location: Left Arm, Cuff Size: Normal)   Pulse 64   Temp 97.6 F (36.4 C) (Temporal)   Ht 5\' 11"  (1.803 m)   Wt 140 lb 3.2 oz (63.6 kg)   SpO2 96%   BMI 19.55 kg/m  Physical Exam Constitutional:      Appearance: Normal appearance.  HENT:     Head: Normocephalic and atraumatic.     Right Ear: Tympanic membrane and ear canal normal.     Left Ear: Tympanic membrane and ear canal normal.     Mouth/Throat:     Mouth: Mucous membranes are moist.     Pharynx: Oropharynx is clear.  Cardiovascular:     Rate and Rhythm: Normal rate and regular rhythm.  Pulmonary:     Effort: Pulmonary effort is normal.     Breath sounds: Normal breath sounds.  Musculoskeletal:        General: Normal range of motion.  Skin:    General: Skin is warm and dry.  Neurological:      General: No focal deficit present.     Mental Status: He is alert and oriented to person, place, and time. Mental status is at baseline.  Psychiatric:        Mood and Affect: Mood normal.        Behavior: Behavior normal.        Thought Content: Thought content normal.        Judgment: Judgment normal.     Lab Results:  CBC    Component Value Date/Time   WBC 10.9 (H) 08/22/2017 1235   RBC 4.43 08/22/2017 1235   HGB 11.5 (L) 08/22/2017 1235   HCT 37.3 (L) 08/22/2017 1235   PLT 442.0 (H) 08/22/2017 1235  MCV 84.2 08/22/2017 1235   MCH 26.5 07/03/2017 0552   MCHC 30.9 08/22/2017 1235   RDW 14.9 08/22/2017 1235   LYMPHSABS 2.8 08/22/2017 1235   MONOABS 1.0 08/22/2017 1235   EOSABS 0.5 08/22/2017 1235   BASOSABS 0.1 08/22/2017 1235    BMET    Component Value Date/Time   NA 141 08/22/2017 1235   K 4.5 08/22/2017 1235   CL 107 08/22/2017 1235   CO2 28 08/22/2017 1235   GLUCOSE 97 08/22/2017 1235   BUN 22 08/22/2017 1235   CREATININE 1.14 08/22/2017 1235   CREATININE 1.22 11/25/2016 1543   CALCIUM 9.3 08/22/2017 1235   GFRNONAA >60 07/03/2017 0552   GFRNONAA 71 04/23/2016 1638   GFRAA >60 07/03/2017 0552   GFRAA 82 04/23/2016 1638    BNP    Component Value Date/Time   BNP 60.1 06/04/2017 0933    ProBNP No results found for: PROBNP  Imaging: No results found.   Assessment & Plan:   COPD (chronic obstructive pulmonary disease) (HCC) -Stable overall, no recent exacerbations. CAT score 17  -Using SABA rescue inhaler multiple times throughout the week -Plan trial Anoro 1 puff daily (sample given, if beneficial will send in Rx) -Continue Albuterol hfa OR Duoneb q 6 hours prn breakthrough sob/wheezing  -Receiving second Covid vaccine tomorrow  Acute respiratory failure ([place]) -Resolved -Ambulatory O2 today with no desaturation -Plan discontinue oxygen with [facility]   Martyn Ehrich, NP 10/21/2019

## 2019-10-21 NOTE — Assessment & Plan Note (Signed)
-  Resolved -Ambulatory O2 today with no desaturation -Plan discontinue oxygen with [facility]

## 2019-11-18 DIAGNOSIS — R238 Other skin changes: Secondary | ICD-10-CM | POA: Diagnosis not present

## 2019-11-18 DIAGNOSIS — J449 Chronic obstructive pulmonary disease, unspecified: Secondary | ICD-10-CM | POA: Diagnosis not present

## 2019-11-18 DIAGNOSIS — N400 Enlarged prostate without lower urinary tract symptoms: Secondary | ICD-10-CM | POA: Diagnosis not present

## 2019-11-18 DIAGNOSIS — D649 Anemia, unspecified: Secondary | ICD-10-CM | POA: Diagnosis not present

## 2019-11-18 DIAGNOSIS — Z125 Encounter for screening for malignant neoplasm of prostate: Secondary | ICD-10-CM | POA: Diagnosis not present

## 2019-11-18 DIAGNOSIS — E782 Mixed hyperlipidemia: Secondary | ICD-10-CM | POA: Diagnosis not present

## 2019-11-18 DIAGNOSIS — I10 Essential (primary) hypertension: Secondary | ICD-10-CM | POA: Diagnosis not present

## 2019-11-18 DIAGNOSIS — Z0000 Encounter for general adult medical examination without abnormal findings: Secondary | ICD-10-CM | POA: Diagnosis not present

## 2019-12-01 ENCOUNTER — Other Ambulatory Visit (HOSPITAL_COMMUNITY): Payer: Self-pay | Admitting: Psychiatry

## 2019-12-01 DIAGNOSIS — F411 Generalized anxiety disorder: Secondary | ICD-10-CM | POA: Diagnosis not present

## 2019-12-01 DIAGNOSIS — F341 Dysthymic disorder: Secondary | ICD-10-CM | POA: Diagnosis not present

## 2019-12-01 DIAGNOSIS — G2111 Neuroleptic induced parkinsonism: Secondary | ICD-10-CM | POA: Diagnosis not present

## 2019-12-27 DIAGNOSIS — M1711 Unilateral primary osteoarthritis, right knee: Secondary | ICD-10-CM | POA: Diagnosis not present

## 2020-01-01 ENCOUNTER — Inpatient Hospital Stay (HOSPITAL_COMMUNITY)
Admission: EM | Admit: 2020-01-01 | Discharge: 2020-01-06 | DRG: 917 | Disposition: A | Discharge status: Rehabilitation Facility | Payer: PPO | Attending: Neurology | Admitting: Neurology

## 2020-01-01 ENCOUNTER — Other Ambulatory Visit: Payer: Self-pay

## 2020-01-01 ENCOUNTER — Encounter (HOSPITAL_COMMUNITY): Payer: Self-pay | Admitting: Emergency Medicine

## 2020-01-01 ENCOUNTER — Emergency Department (HOSPITAL_COMMUNITY): Payer: PPO

## 2020-01-01 DIAGNOSIS — Z888 Allergy status to other drugs, medicaments and biological substances status: Secondary | ICD-10-CM | POA: Diagnosis not present

## 2020-01-01 DIAGNOSIS — Z87891 Personal history of nicotine dependence: Secondary | ICD-10-CM | POA: Diagnosis not present

## 2020-01-01 DIAGNOSIS — R531 Weakness: Secondary | ICD-10-CM | POA: Diagnosis not present

## 2020-01-01 DIAGNOSIS — I6340 Cerebral infarction due to embolism of unspecified cerebral artery: Secondary | ICD-10-CM | POA: Diagnosis not present

## 2020-01-01 DIAGNOSIS — R29703 NIHSS score 3: Secondary | ICD-10-CM | POA: Diagnosis not present

## 2020-01-01 DIAGNOSIS — J449 Chronic obstructive pulmonary disease, unspecified: Secondary | ICD-10-CM | POA: Diagnosis not present

## 2020-01-01 DIAGNOSIS — R260 Ataxic gait: Secondary | ICD-10-CM | POA: Diagnosis present

## 2020-01-01 DIAGNOSIS — T405X1A Poisoning by cocaine, accidental (unintentional), initial encounter: Principal | ICD-10-CM | POA: Diagnosis present

## 2020-01-01 DIAGNOSIS — E8809 Other disorders of plasma-protein metabolism, not elsewhere classified: Secondary | ICD-10-CM | POA: Diagnosis not present

## 2020-01-01 DIAGNOSIS — N1831 Chronic kidney disease, stage 3a: Secondary | ICD-10-CM | POA: Diagnosis present

## 2020-01-01 DIAGNOSIS — Y92000 Kitchen of unspecified non-institutional (private) residence as  the place of occurrence of the external cause: Secondary | ICD-10-CM | POA: Diagnosis not present

## 2020-01-01 DIAGNOSIS — Z20822 Contact with and (suspected) exposure to covid-19: Secondary | ICD-10-CM | POA: Diagnosis present

## 2020-01-01 DIAGNOSIS — Z79899 Other long term (current) drug therapy: Secondary | ICD-10-CM | POA: Diagnosis not present

## 2020-01-01 DIAGNOSIS — R29898 Other symptoms and signs involving the musculoskeletal system: Secondary | ICD-10-CM

## 2020-01-01 DIAGNOSIS — G8194 Hemiplegia, unspecified affecting left nondominant side: Secondary | ICD-10-CM | POA: Diagnosis present

## 2020-01-01 DIAGNOSIS — I6389 Other cerebral infarction: Secondary | ICD-10-CM | POA: Diagnosis not present

## 2020-01-01 DIAGNOSIS — Z0184 Encounter for antibody response examination: Secondary | ICD-10-CM | POA: Diagnosis not present

## 2020-01-01 DIAGNOSIS — Z881 Allergy status to other antibiotic agents status: Secondary | ICD-10-CM | POA: Diagnosis not present

## 2020-01-01 DIAGNOSIS — G894 Chronic pain syndrome: Secondary | ICD-10-CM | POA: Diagnosis not present

## 2020-01-01 DIAGNOSIS — I5181 Takotsubo syndrome: Secondary | ICD-10-CM | POA: Diagnosis present

## 2020-01-01 DIAGNOSIS — D751 Secondary polycythemia: Secondary | ICD-10-CM | POA: Diagnosis present

## 2020-01-01 DIAGNOSIS — I10 Essential (primary) hypertension: Secondary | ICD-10-CM | POA: Diagnosis not present

## 2020-01-01 DIAGNOSIS — N179 Acute kidney failure, unspecified: Secondary | ICD-10-CM

## 2020-01-01 DIAGNOSIS — I69354 Hemiplegia and hemiparesis following cerebral infarction affecting left non-dominant side: Secondary | ICD-10-CM | POA: Diagnosis not present

## 2020-01-01 DIAGNOSIS — Z8249 Family history of ischemic heart disease and other diseases of the circulatory system: Secondary | ICD-10-CM

## 2020-01-01 DIAGNOSIS — G479 Sleep disorder, unspecified: Secondary | ICD-10-CM | POA: Diagnosis not present

## 2020-01-01 DIAGNOSIS — I5043 Acute on chronic combined systolic (congestive) and diastolic (congestive) heart failure: Secondary | ICD-10-CM | POA: Diagnosis present

## 2020-01-01 DIAGNOSIS — I130 Hypertensive heart and chronic kidney disease with heart failure and stage 1 through stage 4 chronic kidney disease, or unspecified chronic kidney disease: Secondary | ICD-10-CM | POA: Diagnosis not present

## 2020-01-01 DIAGNOSIS — I959 Hypotension, unspecified: Secondary | ICD-10-CM | POA: Diagnosis not present

## 2020-01-01 DIAGNOSIS — S0993XA Unspecified injury of face, initial encounter: Secondary | ICD-10-CM | POA: Diagnosis not present

## 2020-01-01 DIAGNOSIS — F1410 Cocaine abuse, uncomplicated: Secondary | ICD-10-CM | POA: Diagnosis present

## 2020-01-01 DIAGNOSIS — J439 Emphysema, unspecified: Secondary | ICD-10-CM | POA: Diagnosis not present

## 2020-01-01 DIAGNOSIS — S199XXA Unspecified injury of neck, initial encounter: Secondary | ICD-10-CM | POA: Diagnosis not present

## 2020-01-01 DIAGNOSIS — G441 Vascular headache, not elsewhere classified: Secondary | ICD-10-CM | POA: Diagnosis not present

## 2020-01-01 DIAGNOSIS — I639 Cerebral infarction, unspecified: Secondary | ICD-10-CM | POA: Diagnosis present

## 2020-01-01 DIAGNOSIS — I6300 Cerebral infarction due to thrombosis of unspecified precerebral artery: Secondary | ICD-10-CM | POA: Diagnosis not present

## 2020-01-01 DIAGNOSIS — W19XXXA Unspecified fall, initial encounter: Secondary | ICD-10-CM | POA: Diagnosis not present

## 2020-01-01 DIAGNOSIS — I472 Ventricular tachycardia, unspecified: Secondary | ICD-10-CM

## 2020-01-01 DIAGNOSIS — S0033XA Contusion of nose, initial encounter: Secondary | ICD-10-CM | POA: Diagnosis present

## 2020-01-01 DIAGNOSIS — S0990XA Unspecified injury of head, initial encounter: Secondary | ICD-10-CM | POA: Diagnosis not present

## 2020-01-01 DIAGNOSIS — N182 Chronic kidney disease, stage 2 (mild): Secondary | ICD-10-CM | POA: Diagnosis not present

## 2020-01-01 DIAGNOSIS — W1830XA Fall on same level, unspecified, initial encounter: Secondary | ICD-10-CM | POA: Diagnosis present

## 2020-01-01 DIAGNOSIS — I129 Hypertensive chronic kidney disease with stage 1 through stage 4 chronic kidney disease, or unspecified chronic kidney disease: Secondary | ICD-10-CM | POA: Diagnosis present

## 2020-01-01 DIAGNOSIS — I63139 Cerebral infarction due to embolism of unspecified carotid artery: Secondary | ICD-10-CM | POA: Diagnosis not present

## 2020-01-01 DIAGNOSIS — I491 Atrial premature depolarization: Secondary | ICD-10-CM | POA: Diagnosis present

## 2020-01-01 DIAGNOSIS — I63412 Cerebral infarction due to embolism of left middle cerebral artery: Secondary | ICD-10-CM | POA: Diagnosis present

## 2020-01-01 DIAGNOSIS — Z880 Allergy status to penicillin: Secondary | ICD-10-CM | POA: Diagnosis not present

## 2020-01-01 DIAGNOSIS — G8929 Other chronic pain: Secondary | ICD-10-CM | POA: Diagnosis not present

## 2020-01-01 DIAGNOSIS — G4700 Insomnia, unspecified: Secondary | ICD-10-CM | POA: Diagnosis not present

## 2020-01-01 DIAGNOSIS — D62 Acute posthemorrhagic anemia: Secondary | ICD-10-CM | POA: Diagnosis not present

## 2020-01-01 DIAGNOSIS — G8311 Monoplegia of lower limb affecting right dominant side: Secondary | ICD-10-CM | POA: Diagnosis present

## 2020-01-01 DIAGNOSIS — F329 Major depressive disorder, single episode, unspecified: Secondary | ICD-10-CM | POA: Diagnosis present

## 2020-01-01 DIAGNOSIS — I6310 Cerebral infarction due to embolism of unspecified precerebral artery: Secondary | ICD-10-CM | POA: Diagnosis not present

## 2020-01-01 DIAGNOSIS — E785 Hyperlipidemia, unspecified: Secondary | ICD-10-CM | POA: Diagnosis present

## 2020-01-01 DIAGNOSIS — E46 Unspecified protein-calorie malnutrition: Secondary | ICD-10-CM | POA: Diagnosis not present

## 2020-01-01 DIAGNOSIS — I248 Other forms of acute ischemic heart disease: Secondary | ICD-10-CM | POA: Diagnosis not present

## 2020-01-01 DIAGNOSIS — K5900 Constipation, unspecified: Secondary | ICD-10-CM | POA: Diagnosis not present

## 2020-01-01 DIAGNOSIS — I427 Cardiomyopathy due to drug and external agent: Secondary | ICD-10-CM | POA: Diagnosis not present

## 2020-01-01 DIAGNOSIS — B1920 Unspecified viral hepatitis C without hepatic coma: Secondary | ICD-10-CM | POA: Diagnosis not present

## 2020-01-01 DIAGNOSIS — I63423 Cerebral infarction due to embolism of bilateral anterior cerebral arteries: Secondary | ICD-10-CM | POA: Diagnosis not present

## 2020-01-01 DIAGNOSIS — M1990 Unspecified osteoarthritis, unspecified site: Secondary | ICD-10-CM | POA: Diagnosis not present

## 2020-01-01 DIAGNOSIS — D72829 Elevated white blood cell count, unspecified: Secondary | ICD-10-CM | POA: Diagnosis present

## 2020-01-01 DIAGNOSIS — F1110 Opioid abuse, uncomplicated: Secondary | ICD-10-CM | POA: Diagnosis not present

## 2020-01-01 DIAGNOSIS — I454 Nonspecific intraventricular block: Secondary | ICD-10-CM | POA: Diagnosis present

## 2020-01-01 DIAGNOSIS — Z7951 Long term (current) use of inhaled steroids: Secondary | ICD-10-CM | POA: Diagnosis not present

## 2020-01-01 DIAGNOSIS — R000 Tachycardia, unspecified: Secondary | ICD-10-CM | POA: Diagnosis not present

## 2020-01-01 DIAGNOSIS — N400 Enlarged prostate without lower urinary tract symptoms: Secondary | ICD-10-CM | POA: Diagnosis not present

## 2020-01-01 DIAGNOSIS — I447 Left bundle-branch block, unspecified: Secondary | ICD-10-CM | POA: Diagnosis not present

## 2020-01-01 DIAGNOSIS — Z823 Family history of stroke: Secondary | ICD-10-CM | POA: Diagnosis not present

## 2020-01-01 DIAGNOSIS — R29818 Other symptoms and signs involving the nervous system: Secondary | ICD-10-CM | POA: Diagnosis not present

## 2020-01-01 LAB — COMPREHENSIVE METABOLIC PANEL
ALT: 36 U/L (ref 0–44)
AST: 58 U/L — ABNORMAL HIGH (ref 15–41)
Albumin: 4.4 g/dL (ref 3.5–5.0)
Alkaline Phosphatase: 106 U/L (ref 38–126)
Anion gap: 16 — ABNORMAL HIGH (ref 5–15)
BUN: 41 mg/dL — ABNORMAL HIGH (ref 8–23)
CO2: 20 mmol/L — ABNORMAL LOW (ref 22–32)
Calcium: 10.3 mg/dL (ref 8.9–10.3)
Chloride: 101 mmol/L (ref 98–111)
Creatinine, Ser: 1.44 mg/dL — ABNORMAL HIGH (ref 0.61–1.24)
GFR calc Af Amer: 58 mL/min — ABNORMAL LOW (ref >60)
GFR calc non Af Amer: 50 mL/min — ABNORMAL LOW (ref >60)
Glucose, Bld: 99 mg/dL (ref 70–99)
Potassium: 4.1 mmol/L (ref 3.5–5.1)
Sodium: 137 mmol/L (ref 135–145)
Total Bilirubin: 1.3 mg/dL — ABNORMAL HIGH (ref 0.3–1.2)
Total Protein: 7.3 g/dL (ref 6.5–8.1)

## 2020-01-01 LAB — CBC
HCT: 54.5 % — ABNORMAL HIGH (ref 39.0–52.0)
Hemoglobin: 16.7 g/dL (ref 13.0–17.0)
MCH: 26.9 pg (ref 26.0–34.0)
MCHC: 30.6 g/dL (ref 30.0–36.0)
MCV: 87.8 fL (ref 80.0–100.0)
Platelets: 284 10*3/uL (ref 150–400)
RBC: 6.21 MIL/uL — ABNORMAL HIGH (ref 4.22–5.81)
RDW: 13.4 % (ref 11.5–15.5)
WBC: 14.8 10*3/uL — ABNORMAL HIGH (ref 4.0–10.5)
nRBC: 0 % (ref 0.0–0.2)

## 2020-01-01 LAB — I-STAT CHEM 8, ED
BUN: 42 mg/dL — ABNORMAL HIGH (ref 8–23)
Calcium, Ion: 1.08 mmol/L — ABNORMAL LOW (ref 1.15–1.40)
Chloride: 105 mmol/L (ref 98–111)
Creatinine, Ser: 1.5 mg/dL — ABNORMAL HIGH (ref 0.61–1.24)
Glucose, Bld: 94 mg/dL (ref 70–99)
HCT: 54 % — ABNORMAL HIGH (ref 39.0–52.0)
Hemoglobin: 18.4 g/dL — ABNORMAL HIGH (ref 13.0–17.0)
Potassium: 3.8 mmol/L (ref 3.5–5.1)
Sodium: 139 mmol/L (ref 135–145)
TCO2: 24 mmol/L (ref 22–32)

## 2020-01-01 LAB — DIFFERENTIAL
Abs Immature Granulocytes: 0.06 10*3/uL (ref 0.00–0.07)
Basophils Absolute: 0.1 10*3/uL (ref 0.0–0.1)
Basophils Relative: 1 %
Eosinophils Absolute: 0.1 10*3/uL (ref 0.0–0.5)
Eosinophils Relative: 0 %
Immature Granulocytes: 0 %
Lymphocytes Relative: 23 %
Lymphs Abs: 3.3 10*3/uL (ref 0.7–4.0)
Monocytes Absolute: 1.7 10*3/uL — ABNORMAL HIGH (ref 0.1–1.0)
Monocytes Relative: 12 %
Neutro Abs: 9.5 10*3/uL — ABNORMAL HIGH (ref 1.7–7.7)
Neutrophils Relative %: 64 %

## 2020-01-01 MED ORDER — ACETAMINOPHEN 325 MG PO TABS
650.0000 mg | ORAL_TABLET | ORAL | Status: DC | PRN
  Administered 2020-01-06: 10:00:00 650 mg via ORAL
  Filled 2020-01-01 (×2): qty 2

## 2020-01-01 MED ORDER — ACETAMINOPHEN 160 MG/5ML PO SOLN: 650.0000 mg | ORAL | Status: DC | PRN

## 2020-01-01 MED ORDER — AMANTADINE HCL 50 MG/5ML PO SYRP
50.0000 mg | ORAL_SOLUTION | Freq: Two times a day (BID) | ORAL | Status: DC
  Administered 2020-01-02 – 2020-01-06 (×10): 50 mg via ORAL
  Filled 2020-01-01 (×12): qty 5

## 2020-01-01 MED ORDER — BENZONATATE 100 MG PO CAPS
200.0000 mg | ORAL_CAPSULE | Freq: Three times a day (TID) | ORAL | Status: DC
  Administered 2020-01-02 – 2020-01-06 (×15): 200 mg via ORAL
  Filled 2020-01-01 (×17): qty 2

## 2020-01-01 MED ORDER — ALBUTEROL SULFATE (2.5 MG/3ML) 0.083% IN NEBU
2.5000 mg | INHALATION_SOLUTION | RESPIRATORY_TRACT | Status: DC | PRN
  Administered 2020-01-05 – 2020-01-06 (×2): 2.5 mg via RESPIRATORY_TRACT
  Filled 2020-01-01 (×2): qty 3

## 2020-01-01 MED ORDER — FLUOXETINE HCL 10 MG PO CAPS
10.0000 mg | ORAL_CAPSULE | Freq: Every day | ORAL | Status: DC
  Administered 2020-01-02 – 2020-01-06 (×5): 10 mg via ORAL
  Filled 2020-01-01 (×6): qty 1

## 2020-01-01 MED ORDER — SODIUM CHLORIDE 0.9 % IV SOLN: INTRAVENOUS | Status: DC

## 2020-01-01 MED ORDER — LAMOTRIGINE 100 MG PO TABS
200.0000 mg | ORAL_TABLET | Freq: Every morning | ORAL | Status: DC
  Administered 2020-01-02 – 2020-01-06 (×5): 200 mg via ORAL
  Filled 2020-01-01 (×5): qty 2

## 2020-01-01 MED ORDER — SENNOSIDES-DOCUSATE SODIUM 8.6-50 MG PO TABS
1.0000 | ORAL_TABLET | Freq: Every evening | ORAL | Status: DC | PRN
  Administered 2020-01-04 – 2020-01-06 (×2): 1 via ORAL
  Filled 2020-01-01 (×2): qty 1

## 2020-01-01 MED ORDER — CLEVIDIPINE BUTYRATE 0.5 MG/ML IV EMUL
0.0000 mg/h | INTRAVENOUS | Status: DC
  Filled 2020-01-01: qty 50

## 2020-01-01 MED ORDER — TAMSULOSIN HCL 0.4 MG PO CAPS
0.4000 mg | ORAL_CAPSULE | Freq: Every day | ORAL | Status: DC
  Administered 2020-01-02 – 2020-01-06 (×5): 0.4 mg via ORAL
  Filled 2020-01-01 (×5): qty 1

## 2020-01-01 MED ORDER — CLONAZEPAM 0.5 MG PO TABS
1.0000 mg | ORAL_TABLET | Freq: Two times a day (BID) | ORAL | Status: DC
  Administered 2020-01-02 – 2020-01-06 (×10): 1 mg via ORAL
  Filled 2020-01-01 (×9): qty 2
  Filled 2020-01-01: qty 1

## 2020-01-01 MED ORDER — IPRATROPIUM-ALBUTEROL 0.5-2.5 (3) MG/3ML IN SOLN
3.0000 mL | Freq: Three times a day (TID) | RESPIRATORY_TRACT | Status: DC
  Administered 2020-01-02 – 2020-01-04 (×4): 3 mL via RESPIRATORY_TRACT
  Filled 2020-01-01 (×6): qty 3

## 2020-01-01 MED ORDER — SODIUM CHLORIDE 0.9% FLUSH: 3.0000 mL | Freq: Once | INTRAVENOUS | Status: DC

## 2020-01-01 MED ORDER — UMECLIDINIUM-VILANTEROL 62.5-25 MCG/INH IN AEPB: 1.0000 | INHALATION_SPRAY | Freq: Every day | RESPIRATORY_TRACT | Status: DC

## 2020-01-01 MED ORDER — ALTEPLASE (STROKE) FULL DOSE INFUSION
0.9000 mg/kg | Freq: Once | INTRAVENOUS | Status: AC
  Administered 2020-01-01: 61.2 mg via INTRAVENOUS
  Filled 2020-01-01: qty 100

## 2020-01-01 MED ORDER — STROKE: EARLY STAGES OF RECOVERY BOOK
Freq: Once | Status: DC
  Filled 2020-01-01: qty 1

## 2020-01-01 MED ORDER — PANTOPRAZOLE SODIUM 40 MG IV SOLR
40.0000 mg | Freq: Every day | INTRAVENOUS | Status: DC
  Administered 2020-01-02 (×2): 40 mg via INTRAVENOUS
  Filled 2020-01-01 (×3): qty 40

## 2020-01-01 MED ORDER — ARIPIPRAZOLE 10 MG PO TABS
10.0000 mg | ORAL_TABLET | Freq: Every day | ORAL | Status: DC
  Administered 2020-01-02 – 2020-01-06 (×5): 10 mg via ORAL
  Filled 2020-01-01 (×6): qty 1

## 2020-01-01 MED ORDER — SODIUM CHLORIDE 0.9 % IV SOLN
50.0000 mL | Freq: Once | INTRAVENOUS | Status: AC
  Administered 2020-01-01: 23:00:00 50 mL via INTRAVENOUS

## 2020-01-01 MED ORDER — ACETAMINOPHEN 650 MG RE SUPP: 650.0000 mg | RECTAL | Status: DC | PRN

## 2020-01-01 NOTE — ED Provider Notes (Signed)
[facility] EMERGENCY DEPARTMENT Provider Note   CSN: [id number] Arrival date & time: 01/01/20  2100     History Chief Complaint  Patient presents with  . Weakness  . Code Stroke    Sh[place] is a 69 y.o. male.  Patient presents to the ER for sudden onset of left leg weakness numbness and difficulty holding balance.  Patient has a history of high blood pressure, COPD, hepatitis C.  Patient feels a start at 630 this evening.  Patient is a past cigarette smoker.  No concerning headaches.  Patient denies blood thinning medications.        Past Medical History:  Diagnosis Date  . Arthritis   . Cervical disc herniation   . COPD (chronic obstructive pulmonary disease) ([place])   . Headache    " due to antibiotics"  . Hepatitis C   . Hypertension     Patient Active Problem List   Diagnosis Date Noted  . Stroke (cerebrum) ([place]) 01/01/2020  . Normocytic anemia 06/04/2017  . COPD (chronic obstructive pulmonary disease) ([place]) 06/04/2017  . Pneumococcal bacteremia 05/28/2017  . Pneumococcal pneumonia ([place]) 05/27/2017  . IVDU (intravenous drug user)   . Liver fibrosis 04/24/2016  . Chronic hepatitis C without hepatic coma ([place]) 04/19/2015  . Substance abuse ([place]) 04/19/2015  . HTN (hypertension) 11/12/2013  . Depression 11/12/2013    Past Surgical History:  Procedure Laterality Date  . APPLICATION OF A-CELL OF EXTREMITY Left 12/05/2016   Procedure: APPLICATION OF A-CELL OF EXTREMITY;  Surgeon: Loel Lofty Dillingham, DO;  Location: [place];  Service: Plastics;  Laterality: Left;  . APPLICATION OF WOUND VAC Left 12/05/2016   Procedure: APPLICATION OF WOUND VAC;  Surgeon: Loel Lofty Dillingham, DO;  Location: [place];  Service: Plastics;  Laterality: Left;  . I & D EXTREMITY Left 11/03/2016   Procedure: IRRIGATION AND DEBRIDEMENT EXTREMITY;  Surgeon: Leandrew Koyanagi, MD;  Location: [place];  Service: Orthopedics;  Laterality: Left;  . I & D EXTREMITY Left 12/05/2016     Procedure: IRRIGATION AND DEBRIDEMENT EXTREMITY;  Surgeon: Loel Lofty Dillingham, DO;  Location: [place];  Service: Plastics;  Laterality: Left;  . INCISION AND DRAINAGE ABSCESS Right 11/12/2013   Procedure: INCISION AND DRAINAGE AND OPEN PACKING OF RIGHT CALF  ABSCESS;  Surgeon: Earnstine Regal, MD;  Location: [facility];  Service: General;  Laterality: Right;  . INCISION AND DRAINAGE OF WOUND Left 01/29/2017   Procedure: IRRIGATION AND DEBRIDEMENT OF LEFT LEG WOUND WITH ABRA PLACEMENT AND PLACEMENT OF WOUND VAC;  Surgeon: Wallace Going, DO;  Location: [facility];  Service: Plastics;  Laterality: Left;  . KNEE SURGERY         Family History  Problem Relation Age of Onset  . Heart disease Mother   . Stroke Mother   . Heart disease Father   . Stroke Father     Social History   Tobacco Use  . Smoking status: Former Smoker    Packs/day: 1.00    Years: 32.00    Pack years: 32.00    Types: Cigarettes    Quit date: 2002    Years since quitting: 19.3  . Smokeless tobacco: Never Used  Substance Use Topics  . Alcohol use: No    Alcohol/week: 0.0 standard drinks  . Drug use: Yes    Types: Heroin, Cocaine    Comment: " relapsed once this year " 2018    Home Medications Prior to Admission medications  Medication Sig Start Date End Date Taking? Authorizing Provider  albuterol (PROVENTIL) (2.5 MG/3ML) 0.083% nebulizer solution Take 3 mLs (2.5 mg total) by nebulization every 2 (two) hours as needed for wheezing or shortness of breath. 06/13/17   Bonnielee Haff, MD  amantadine (SYMMETREL) 100 MG capsule Take 50 mg by mouth 2 (two) times daily.    [provider]  ARIPiprazole (ABILIFY) 15 MG tablet 10 mg. 7/29 dosage decreased to 10 mg 10/13/17   [provider]  atenolol (TENORMIN) 25 MG tablet Take 12.5 mg by mouth daily.    [provider]  benzonatate (TESSALON) 100 MG capsule Take 2 capsules (200 mg total) by mouth 3 (three) times daily. Patient not taking:  Reported on 10/21/2019 10/22/18   Martyn Ehrich, NP  clonazePAM (KLONOPIN) 0.5 MG tablet Take 1 tablet (0.5 mg total) by mouth 2 (two) times daily. Patient taking differently: Take 1 mg by mouth 2 (two) times daily.  06/13/17   Bonnielee Haff, MD  feeding supplement, ENSURE ENLIVE, (ENSURE ENLIVE) LIQD Take 237 mLs by mouth 2 (two) times daily between meals. 06/13/17   Bonnielee Haff, MD  FLUoxetine (PROZAC) 10 MG tablet Take 10 mg by mouth daily.    [provider]  fluticasone (FLONASE) 50 MCG/ACT nasal spray Place 1 spray into both nostrils every morning. 10/17/17   [provider]  ibuprofen (ADVIL,MOTRIN) 600 MG tablet Take 1 tablet (600 mg total) by mouth 4 (four) times daily. 06/13/17   Bonnielee Haff, MD  ipratropium-albuterol (DUONEB) 0.5-2.5 (3) MG/3ML SOLN Take 3 mLs by nebulization 3 (three) times daily. 10/21/19   Martyn Ehrich, NP  lamoTRIgine (LAMICTAL) 200 MG tablet Take 1 tablet (200 mg total) by mouth every morning. 06/14/17   Bonnielee Haff, MD  OXYCONTIN 40 MG 12 hr tablet Take 1 tablet by mouth as needed. 05/04/19   [provider]  silodosin (RAPAFLO) 8 MG CAPS capsule Take 8 mg daily with breakfast by mouth.    [provider]  umeclidinium-vilanterol (ANORO ELLIPTA) 62.5-25 MCG/INH AEPB Inhale 1 puff into the lungs daily. 10/21/19   Martyn Ehrich, NP    Allergies    Propoxyphene, Amoxicillin, Ciprofloxacin, and Depakote [divalproex sodium]  Review of Systems   Review of Systems  Constitutional: Negative for chills and fever.  HENT: Negative for congestion.   Eyes: Negative for visual disturbance.  Respiratory: Negative for shortness of breath.   Cardiovascular: Negative for chest pain.  Gastrointestinal: Negative for abdominal pain and vomiting.  Genitourinary: Negative for dysuria and flank pain.  Musculoskeletal: Negative for back pain, neck pain and neck stiffness.  Skin: Negative for rash.  Neurological: Positive for  weakness and numbness. Negative for light-headedness and headaches.    Physical Exam Updated Vital Signs BP 111/71   Pulse 71   Temp 98.6 F (37 C)   Resp 15   Ht 5\' 11"  (1.803 m)   Wt 68 kg   SpO2 97%   BMI 20.92 kg/m   Physical Exam Vitals and nursing note reviewed.  Constitutional:      Appearance: He is well-developed.  HENT:     Head: Normocephalic and atraumatic.  Eyes:     General:        Right eye: No discharge.        Left eye: No discharge.     Conjunctiva/sclera: Conjunctivae normal.  Neck:     Trachea: No tracheal deviation.  Cardiovascular:     Rate and Rhythm: Normal rate  and regular rhythm.  Pulmonary:     Effort: Pulmonary effort is normal.     Breath sounds: Normal breath sounds.  Abdominal:     General: There is no distension.     Palpations: Abdomen is soft.     Tenderness: There is no abdominal tenderness. There is no guarding.  Musculoskeletal:     Cervical back: Normal range of motion and neck supple.  Skin:    General: Skin is warm.     Capillary Refill: Capillary refill takes less than 2 seconds.     Findings: No rash.  Neurological:     Mental Status: He is alert and oriented to person, place, and time.     GCS: GCS eye subscore is 4. GCS verbal subscore is 5. GCS motor subscore is 6.     Cranial Nerves: No facial asymmetry.     Coordination: Finger-Nose-Finger Test normal.     Comments: Patient having difficulty with balance while sitting in the chair leaning to the left.  Patient has numbness left side and mild weakness left lower extremity.  Right side normal strength.  Cranial nerves intact. Normal speech  Psychiatric:        Mood and Affect: Mood normal.     ED Results / Procedures / Treatments   Labs (all labs ordered are listed, but only abnormal results are displayed) Labs Reviewed  CBC - Abnormal; Notable for the following components:      Result Value   WBC 14.8 (*)    RBC 6.21 (*)    HCT 54.5 (*)    All other  components within normal limits  DIFFERENTIAL - Abnormal; Notable for the following components:   Neutro Abs 9.5 (*)    Monocytes Absolute 1.7 (*)    All other components within normal limits  COMPREHENSIVE METABOLIC PANEL - Abnormal; Notable for the following components:   CO2 20 (*)    BUN 41 (*)    Creatinine, Ser 1.44 (*)    AST 58 (*)    Total Bilirubin 1.3 (*)    GFR calc non Af Amer 50 (*)    GFR calc Af Amer 58 (*)    Anion gap 16 (*)    All other components within normal limits  I-STAT CHEM 8, ED - Abnormal; Notable for the following components:   BUN 42 (*)    Creatinine, Ser 1.50 (*)    Calcium, Ion 1.08 (*)    Hemoglobin 18.4 (*)    HCT 54.0 (*)    All other components within normal limits  PROTIME-INR  APTT  HIV ANTIBODY (ROUTINE TESTING W REFLEX)  HEMOGLOBIN A1C  LIPID PANEL  CBG MONITORING, ED  TROPONIN I (HIGH SENSITIVITY)    EKG EKG Interpretation  Date/Time:  Saturday January 01 2020 22:15:28 EDT Ventricular Rate:  76 PR Interval:    QRS Duration: 97 QT Interval:  438 QTC Calculation: 493 R Axis:   -32 Text Interpretation: Sinus rhythm Biatrial enlargement Left ventricular hypertrophy Probable anterior infarct, age indeterminate Confirmed by Elnora Morrison [phone]) on 01/01/2020 10:28:14 PM   Radiology CT Cervical Spine Wo Contrast  Result Date: 01/01/2020 CLINICAL DATA:  Status post trauma. EXAM: CT CERVICAL SPINE WITHOUT CONTRAST TECHNIQUE: Multidetector CT imaging of the cervical spine was performed without intravenous contrast. Multiplanar CT image reconstructions were also generated. COMPARISON:  None. FINDINGS: Alignment: Normal. Skull base and vertebrae: No acute fracture. No primary bone lesion or focal pathologic process. Soft tissues and spinal canal: No prevertebral  fluid or swelling. No visible canal hematoma. Disc levels: Moderate to marked severity endplate sclerosis is seen at the levels of C5-C6 and C6-C7 with mild endplate sclerosis seen  throughout the remainder of the cervical spine. Moderate to marked severity intervertebral disc space narrowing is seen at the levels of C5-C6 and C6-C7. Marked severity bilateral multilevel facet joint hypertrophy is seen. Upper chest: Moderate severity emphysematous lung disease is seen involving the bilateral upper lobes. Other: None. IMPRESSION: 1. No acute osseous abnormality of the cervical spine. 2. Moderate to marked severity multilevel degenerative changes, most prominent at the levels of C5-C6 and C6-C7. 3. Moderate severity emphysematous lung disease involving the bilateral upper lobes. Emphysema (ICD10-J43.9). Electronically Signed   By: Virgina Norfolk M.D.   On: 01/01/2020 21:51   CT HEAD CODE STROKE WO CONTRAST  Result Date: 01/01/2020 CLINICAL DATA:  Code stroke.  Left-sided weakness. EXAM: CT HEAD WITHOUT CONTRAST TECHNIQUE: Contiguous axial images were obtained from the base of the skull through the vertex without intravenous contrast. COMPARISON:  MRI 07/12/2013 FINDINGS: Brain: Mild generalized atrophy. Chronic small-vessel ischemic changes of the white matter. No sign of acute infarction, mass lesion, hemorrhage, hydrocephalus or extra-axial collection. Vascular: There is atherosclerotic calcification of the major vessels at the base of the brain. Skull: Normal Sinuses/Orbits: Clear/normal. Insignificant retention cyst left maxillary sinus. Other: None ASPECTS ([place] Stroke Program Early CT Score) - Ganglionic level infarction (caudate, lentiform nuclei, internal capsule, insula, M1-M3 cortex): 7 - Supraganglionic infarction (M4-M6 cortex): 3 Total score (0-10 with 10 being normal): 10 IMPRESSION: 1. No acute head CT finding. Chronic small-vessel changes of the white matter. 2. ASPECTS is 10 3. These results were communicated to Dr. Cheral Marker at 9:36 pmon 4/24/2021by text page via the [facility] messaging system. Electronically Signed   By: Nelson Chimes M.D.   On: 01/01/2020 21:37   CT  Maxillofacial Wo Contrast  Result Date: 01/01/2020 CLINICAL DATA:  Status post trauma. EXAM: CT HEAD WITHOUT CONTRAST CT MAXILLOFACIAL WITHOUT CONTRAST TECHNIQUE: Multidetector CT imaging of the head and maxillofacial structures were performed using the standard protocol without intravenous contrast. Multiplanar CT image reconstructions of the maxillofacial structures were also generated. COMPARISON:  MRI head July 12, 2013. FINDINGS: CT HEAD FINDINGS Brain: There is mild cerebral atrophy with widening of the extra-axial spaces and ventricular dilatation. There are areas of decreased attenuation within the white matter tracts of the supratentorial brain, consistent with microvascular disease changes. Vascular: No hyperdense vessel or unexpected calcification. Skull: Normal. Negative for fracture or focal lesion. Other: There is marked severity left maxillary sinus mucosal thickening. CT MAXILLOFACIAL FINDINGS Osseous: No fracture or mandibular dislocation. No destructive process. Orbits: Negative. No traumatic or inflammatory finding. Sinuses: There is marked severity left maxillary sinus mucosal thickening. Soft tissues: Negative. IMPRESSION: 1. No acute intracranial abnormality. 2. Mild cerebral atrophy and microvascular disease changes of the supratentorial brain. 3. Marked severity left maxillary sinus mucosal thickening. Electronically Signed   By: Virgina Norfolk M.D.   On: 01/01/2020 21:48    Procedures .Critical Care Performed by: Elnora Morrison, MD Authorized by: Elnora Morrison, MD   Critical care provider statement:    Critical care time (minutes):  40   Critical care start time:  01/01/2020 9:20 AM   Critical care end time:  01/02/2020 10:00 AM   Critical care time was exclusive of:  Separately billable procedures and treating other patients and teaching time   Critical care was necessary to treat or prevent imminent or life-threatening deterioration  of the following conditions:  CNS  failure or compromise   Critical care was time spent personally by me on the following activities:  Discussions with consultants, evaluation of patient's response to treatment, examination of patient, ordering and review of laboratory studies, ordering and review of radiographic studies, pulse oximetry, re-evaluation of patient's condition, obtaining history from patient or surrogate, review of old charts and ordering and performing treatments and interventions   (including critical care time)  Medications Ordered in ED Medications  sodium chloride flush (NS) 0.9 % injection 3 mL (0 mLs Intravenous Hold 01/01/20 2326)   stroke: mapping our early stages of recovery book (has no administration in time range)  acetaminophen (TYLENOL) tablet 650 mg (has no administration in time range)    Or  acetaminophen (TYLENOL) 160 MG/5ML solution 650 mg (has no administration in time range)    Or  acetaminophen (TYLENOL) suppository 650 mg (has no administration in time range)  senna-docusate (Senokot-S) tablet 1 tablet (has no administration in time range)  pantoprazole (PROTONIX) injection 40 mg (has no administration in time range)  0.9 %  sodium chloride infusion (has no administration in time range)  clevidipine (CLEVIPREX) infusion 0.5 mg/mL (0 mg/hr Intravenous Hold 01/01/20 2305)  albuterol (PROVENTIL) (2.5 MG/3ML) 0.083% nebulizer solution 2.5 mg (has no administration in time range)  amantadine (SYMMETREL) 50 MG/5ML solution 50 mg (has no administration in time range)  ARIPiprazole (ABILIFY) tablet 10 mg (has no administration in time range)  benzonatate (TESSALON) capsule 200 mg (has no administration in time range)  clonazePAM (KLONOPIN) tablet 1 mg (has no administration in time range)  FLUoxetine (PROZAC) capsule 10 mg (has no administration in time range)  ipratropium-albuterol (DUONEB) 0.5-2.5 (3) MG/3ML nebulizer solution 3 mL (has no administration in time range)  lamoTRIgine (LAMICTAL)  tablet 200 mg (has no administration in time range)  tamsulosin (FLOMAX) capsule 0.4 mg (has no administration in time range)  alteplase (ACTIVASE) 1 mg/mL infusion 61.2 mg (0 mg/kg  68 kg Intravenous Stopped 01/01/20 2314)    Followed by  0.9 %  sodium chloride infusion (50 mLs Intravenous New Bag/Given 01/01/20 2315)    ED Course  I have reviewed the triage vital signs and the nursing notes.  Pertinent labs & imaging results that were available during my care of the patient were reviewed by me and considered in my medical decision making (see chart for details).    MDM Rules/Calculators/A&P                      Patient presents the emergency room for acute left-sided symptoms.  Nursing called me to the triage area and assessed and concern for acute stroke with 630 onset time.  Code stroke called evaluated by neurology as well.  Patient determined to be a candidate for TPA and started per neurology note.  Patient being monitored closely in the emergency room until ICU bed available.  Blood work reviewed white blood cell count 14.8, no definitive source of any infection possible stress response will monitor.  Creatinine 1.5.  Plan to monitor and consider IV fluids.  On reassessment patient feels improved with left leg symptoms.  Updated patient's significant other after permission and she identified his name and date of birth.  Patient admitted.  Final Clinical Impression(s) / ED Diagnoses Final diagnoses:  Acute renal failure, unspecified acute renal failure type ([place])  Acute ischemic stroke ([facility])  Left leg weakness    Rx / DC Orders ED Discharge  Orders    None       Elnora Morrison, MD 01/02/20 [phone]

## 2020-01-01 NOTE — Progress Notes (Signed)
Pharmacist Code Stroke Response  Notified to mix tPA at 2203 by Dr. Cheral Marker  Delivered tPA to RN at 2207  tPA dose = 6.1mg  bolus over 1 minute followed by 55.1mg  for a total dose of 61.2mg  over 1 hour   Travis Salinas 01/01/20 10:13 PM

## 2020-01-01 NOTE — Code Documentation (Signed)
Responded to Code Stroke called on pt in triage at 2125 for L sided weakness and L sided numbness, [id number]. NIH-3 for RUE sensory deficit, LLE drift, and RLE ataxia. CT head-no acute changes. Original decision made at 2145 for no TPA, however, after seeing pt walk to wheelchair(with severe gait ataxia) and speaking with pt, decision made at 2203 to give TPA. TPA started at 2212 after placing PIV in pt. Plan to admit to ICU.

## 2020-01-01 NOTE — H&P (Signed)
Admission H&P    Chief Complaint: Acute onset of gait instability  HPI: Travis Salinas is an 69 y.o. male who presents acutely from home after having several falls. The falls per patient were due to sudden onset of gait instability, occurring at 1830. He was talking to a friend at that time when his legs suddenly gave out. There was no dizziness and no LOC. He was helped to the floor by his friend. After he got up, he felt as though he was drifting to the left. He then started falling backwards and after several steps backwards he fell and hit his nose. EMS was called and he was transported to the ED.   He has felt dehydrated. He was orthostatic with BPs ranging from 110/62 to 102/58. On arrival to the ED, he was noted to be leaning to the left. He also was complaining of left sided weakness and sensory numbness. Code Stroke was called.   In CT, he had BLE ataxia, worse on the right. He also had an unsteady gait requiring assistance. The gait was wide based and ataxic with drifting to the right and BLE weakness.   He denies vision changes, speech problems, neck pain or back pain. He has a history of "2 ruptured discs in my neck" but no current neck symptoms. He did not have acute back pain at the time of onset of his stroke symptoms.    The patient has no prior history of stroke or MI. He denies using EtOH.   LSN: [id number] tPA Given: Yes   Past Medical History:  Diagnosis Date  . Arthritis   . Cervical disc herniation   . COPD (chronic obstructive pulmonary disease) ([place])   . Headache    " due to antibiotics"  . Hepatitis C   . Hypertension     Past Surgical History:  Procedure Laterality Date  . APPLICATION OF A-CELL OF EXTREMITY Left 12/05/2016   Procedure: APPLICATION OF A-CELL OF EXTREMITY;  Surgeon: Loel Lofty Dillingham, DO;  Location: [facility];  Service: Plastics;  Laterality: Left;  . APPLICATION OF WOUND VAC Left 12/05/2016   Procedure: APPLICATION OF WOUND VAC;  Surgeon: Loel Lofty  Dillingham, DO;  Location: [place] [facility] [place];  Service: Plastics;  Laterality: Left;  . I & D EXTREMITY Left 11/03/2016   Procedure: IRRIGATION AND DEBRIDEMENT EXTREMITY;  Surgeon: Leandrew Koyanagi, MD;  Location: [facility];  Service: Orthopedics;  Laterality: Left;  . I & D EXTREMITY Left 12/05/2016   Procedure: IRRIGATION AND DEBRIDEMENT EXTREMITY;  Surgeon: Loel Lofty Dillingham, DO;  Location: [place];  Service: Plastics;  Laterality: Left;  . INCISION AND DRAINAGE ABSCESS Right 11/12/2013   Procedure: INCISION AND DRAINAGE AND OPEN PACKING OF RIGHT CALF  ABSCESS;  Surgeon: Earnstine Regal, MD;  Location: [facility];  Service: General;  Laterality: Right;  . INCISION AND DRAINAGE OF WOUND Left 01/29/2017   Procedure: IRRIGATION AND DEBRIDEMENT OF LEFT LEG WOUND WITH ABRA PLACEMENT AND PLACEMENT OF WOUND VAC;  Surgeon: Wallace Going, DO;  Location: [facility];  Service: Plastics;  Laterality: Left;  . KNEE SURGERY      Family History  Problem Relation Age of Onset  . Heart disease Mother   . Stroke Mother   . Heart disease Father   . Stroke Father    Social History:  reports that he quit smoking about 19 years ago. His smoking use included cigarettes. He has a 32.00 pack-year smoking history. He has never used  smokeless tobacco. He reports current drug use. Drugs: Heroin and Cocaine. He reports that he does not drink alcohol.  Allergies:  Allergies  Allergen Reactions  . Propoxyphene Nausea And Vomiting  . Amoxicillin Nausea And Vomiting  . Ciprofloxacin Nausea Only  . Depakote [Divalproex Sodium] Other (See Comments)    hallucinations   No current facility-administered medications on file prior to encounter.   Current Outpatient Medications on File Prior to Encounter  Medication Sig Dispense Refill  . albuterol (PROVENTIL) (2.5 MG/3ML) 0.083% nebulizer solution Take 3 mLs (2.5 mg total) by nebulization every 2 (two) hours as needed for wheezing or shortness of breath. 75 mL 12  . amantadine (SYMMETREL) 100 MG  capsule Take 50 mg by mouth 2 (two) times daily.    . ARIPiprazole (ABILIFY) 15 MG tablet 10 mg. 7/29 dosage decreased to 10 mg  3  . atenolol (TENORMIN) 25 MG tablet Take 12.5 mg by mouth daily.    . benzonatate (TESSALON) 100 MG capsule Take 2 capsules (200 mg total) by mouth 3 (three) times daily. (Patient not taking: Reported on 10/21/2019) 30 capsule 1  . clonazePAM (KLONOPIN) 0.5 MG tablet Take 1 tablet (0.5 mg total) by mouth 2 (two) times daily. (Patient taking differently: Take 1 mg by mouth 2 (two) times daily. ) 30 tablet 0  . feeding supplement, ENSURE ENLIVE, (ENSURE ENLIVE) LIQD Take 237 mLs by mouth 2 (two) times daily between meals. 237 mL 12  . FLUoxetine (PROZAC) 10 MG tablet Take 10 mg by mouth daily.    . fluticasone (FLONASE) 50 MCG/ACT nasal spray Place 1 spray into both nostrils every morning.  0  . ibuprofen (ADVIL,MOTRIN) 600 MG tablet Take 1 tablet (600 mg total) by mouth 4 (four) times daily. 30 tablet 0  . ipratropium-albuterol (DUONEB) 0.5-2.5 (3) MG/3ML SOLN Take 3 mLs by nebulization 3 (three) times daily. 360 mL   . lamoTRIgine (LAMICTAL) 200 MG tablet Take 1 tablet (200 mg total) by mouth every morning.    . OXYCONTIN 40 MG 12 hr tablet Take 1 tablet by mouth as needed.    . silodosin (RAPAFLO) 8 MG CAPS capsule Take 8 mg daily with breakfast by mouth.    . umeclidinium-vilanterol (ANORO ELLIPTA) 62.5-25 MCG/INH AEPB Inhale 1 puff into the lungs daily. 60 each 0     ROS: As per HPI. Comprehensive ROS otherwise negative.    Physical Examination: Blood pressure 103/61, pulse 85, temperature 97.8 F (36.6 C), temperature source Oral, resp. rate 16, height 5\' 11"  (1.803 m), weight 68 kg, SpO2 100 %.  HEENT-  Contusion/abrasion to nose.   Lungs - Respirations unlabored Extremities - No edema  Neurologic Examination: Mental Status: Awake and alert. Speech fluent with intact comprehension and naming, but did have difficulty with one step of a 3-step directional  command. Repeated "ifs ands or buts" when asked to repeat "no ifs ands or buts". Oriented x 5. No dysarthria.  Cranial Nerves: II:  Visual fields intact. No extinction to DSS. PERRL.  III,IV, VI: No ptosis. EOMI. No nystagmus. V,VII: Smile symmetric, facial temp sensation equal bilaterally VIII: HOH IX,X: Palate rises symmetrically XI: Symmetric shoulder shrug XII: midline tongue extension  Motor: Right : Upper extremity   5/5    Left:     Upper extremity   5/5  Lower extremity   5/5     Lower extremity   5/5 No pronator drift.  Slight drift with LLE when elevating antigravity Sensory: Decreased temp sensation to  RUE. Temp sensation normal to LUE and BLE. FT intact x 4 with no extinction Deep Tendon Reflexes:  2+ bilateral brachioradialis and biceps 2+ bilateral patellae Plantars: Right: downgoing   Left: downgoing Cerebellar: No ataxia with FNF bilaterally. Ataxic with right H-S. Equivocal ataxia with left H-S.  Gait: Marked ataxia with drifting to the left, wide based gait, requiring assistance. Also with BLE weakness, worse on the left during ambulation.   No results found for this or any previous visit (from the past 48 hour(s)). CT Cervical Spine Wo Contrast  Result Date: 01/01/2020 CLINICAL DATA:  Status post trauma. EXAM: CT CERVICAL SPINE WITHOUT CONTRAST TECHNIQUE: Multidetector CT imaging of the cervical spine was performed without intravenous contrast. Multiplanar CT image reconstructions were also generated. COMPARISON:  None. FINDINGS: Alignment: Normal. Skull base and vertebrae: No acute fracture. No primary bone lesion or focal pathologic process. Soft tissues and spinal canal: No prevertebral fluid or swelling. No visible canal hematoma. Disc levels: Moderate to marked severity endplate sclerosis is seen at the levels of C5-C6 and C6-C7 with mild endplate sclerosis seen throughout the remainder of the cervical spine. Moderate to marked severity intervertebral disc space  narrowing is seen at the levels of C5-C6 and C6-C7. Marked severity bilateral multilevel facet joint hypertrophy is seen. Upper chest: Moderate severity emphysematous lung disease is seen involving the bilateral upper lobes. Other: None. IMPRESSION: 1. No acute osseous abnormality of the cervical spine. 2. Moderate to marked severity multilevel degenerative changes, most prominent at the levels of C5-C6 and C6-C7. 3. Moderate severity emphysematous lung disease involving the bilateral upper lobes. Emphysema (ICD10-J43.9). Electronically Signed   By: Virgina Norfolk M.D.   On: 01/01/2020 21:51   CT HEAD CODE STROKE WO CONTRAST  Result Date: 01/01/2020 CLINICAL DATA:  Code stroke.  Left-sided weakness. EXAM: CT HEAD WITHOUT CONTRAST TECHNIQUE: Contiguous axial images were obtained from the base of the skull through the vertex without intravenous contrast. COMPARISON:  MRI 07/12/2013 FINDINGS: Brain: Mild generalized atrophy. Chronic small-vessel ischemic changes of the white matter. No sign of acute infarction, mass lesion, hemorrhage, hydrocephalus or extra-axial collection. Vascular: There is atherosclerotic calcification of the major vessels at the base of the brain. Skull: Normal Sinuses/Orbits: Clear/normal. Insignificant retention cyst left maxillary sinus. Other: None ASPECTS ([place] Stroke Program Early CT Score) - Ganglionic level infarction (caudate, lentiform nuclei, internal capsule, insula, M1-M3 cortex): 7 - Supraganglionic infarction (M4-M6 cortex): 3 Total score (0-10 with 10 being normal): 10 IMPRESSION: 1. No acute head CT finding. Chronic small-vessel changes of the white matter. 2. ASPECTS is 10 3. These results were communicated to Dr. Cheral Marker at 9:36 pmon 4/24/2021by text page via the [facility] messaging system. Electronically Signed   By: Nelson Chimes M.D.   On: 01/01/2020 21:37   CT Maxillofacial Wo Contrast  Result Date: 01/01/2020 CLINICAL DATA:  Status post trauma. EXAM: CT HEAD  WITHOUT CONTRAST CT MAXILLOFACIAL WITHOUT CONTRAST TECHNIQUE: Multidetector CT imaging of the head and maxillofacial structures were performed using the standard protocol without intravenous contrast. Multiplanar CT image reconstructions of the maxillofacial structures were also generated. COMPARISON:  MRI head July 12, 2013. FINDINGS: CT HEAD FINDINGS Brain: There is mild cerebral atrophy with widening of the extra-axial spaces and ventricular dilatation. There are areas of decreased attenuation within the white matter tracts of the supratentorial brain, consistent with microvascular disease changes. Vascular: No hyperdense vessel or unexpected calcification. Skull: Normal. Negative for fracture or focal lesion. Other: There is marked severity left maxillary sinus  mucosal thickening. CT MAXILLOFACIAL FINDINGS Osseous: No fracture or mandibular dislocation. No destructive process. Orbits: Negative. No traumatic or inflammatory finding. Sinuses: There is marked severity left maxillary sinus mucosal thickening. Soft tissues: Negative. IMPRESSION: 1. No acute intracranial abnormality. 2. Mild cerebral atrophy and microvascular disease changes of the supratentorial brain. 3. Marked severity left maxillary sinus mucosal thickening. Electronically Signed   By: Virgina Norfolk M.D.   On: 01/01/2020 21:48    Assessment: 68 y.o. male presenting to the ED with acute onset of gait instability.  1. Exam reveals severe gait ataxia, RUE sensory deficit and RLE ataxia. Exam findings best localize to the posterior fossa. Most likely etiology is acute ischemic stroke.  2. CT head: No acute head CT finding. Chronic small-vessel ischemic changes of the white matter. ASPECTS is 10  3. Stroke Risk Factors - HTN 4. After comprehensive review of possible contraindications, he has no absolute contraindications to tPA administration. 5. The patient is a tPA candidate. Discussed extensively the risks/benefits of tPA treatment  vs. no treatment with the patient, including risks of hemorrhage and death with tPA administration versus worse overall outcomes on average in patients within tPA time window who are not administered tPA. Overall benefits of tPA regarding long-term prognosis are felt to outweigh risks. The patient expressed understanding and wish to proceed with tPA.   Plan: 1. Admitting to the ICU under the Neurology service.  2. Post-tPA order set to include frequent neuro checks and BP management.  3. No antiplatelet medications or anticoagulants for at least 24 hours following tPA.  4. DVT prophylaxis with SCDs.  5. Will need to be started on a statin.  6. Will need to start antiplatelet therapy if follow up CT at 24 hours is negative for hemorrhagic conversion. 7. CTA of head and neck  8. TTE.  9. MRI brain  10. PT/OT/Speech.  11. NPO until passes swallow evaluation.  12. Fasting lipid panel, HgbA1c 13. Telemetry monitoring  45 minutes spent in the emergent neurological evaluation and management of this critically ill patient with acute stroke.   Electronically signed: Dr. Kerney Elbe 01/01/2020, 10:03 PM

## 2020-01-01 NOTE — ED Triage Notes (Addendum)
Per EMS, pt from home, has had several falls.  States tonight he was standing talking to a friend and "my legs just gave out."  Denies any dizziness and LOC, friend helped to floor.  Happened a second time, this time fell and hit his nose.  Pt reports he feels dehydrated.  Was otho static 110/62 to 102/58.  Pt is now leaning to the left.  PA in triage to see.  Calling for Provider.  Dr. Reather Converse called Code STROKE  Pt going to CT 2 -  [id number]

## 2020-01-01 NOTE — ED Notes (Addendum)
This RN and Mindy, RN attempted 4x for second IV placement. Unsuccessful.

## 2020-01-02 ENCOUNTER — Encounter (HOSPITAL_COMMUNITY): Payer: PPO

## 2020-01-02 ENCOUNTER — Inpatient Hospital Stay (HOSPITAL_COMMUNITY): Payer: PPO

## 2020-01-02 DIAGNOSIS — I6389 Other cerebral infarction: Secondary | ICD-10-CM

## 2020-01-02 DIAGNOSIS — I639 Cerebral infarction, unspecified: Secondary | ICD-10-CM

## 2020-01-02 DIAGNOSIS — R29898 Other symptoms and signs involving the musculoskeletal system: Secondary | ICD-10-CM

## 2020-01-02 LAB — BASIC METABOLIC PANEL
Anion gap: 13 (ref 5–15)
BUN: 29 mg/dL — ABNORMAL HIGH (ref 8–23)
CO2: 20 mmol/L — ABNORMAL LOW (ref 22–32)
Calcium: 9.1 mg/dL (ref 8.9–10.3)
Chloride: 107 mmol/L (ref 98–111)
Creatinine, Ser: 1.31 mg/dL — ABNORMAL HIGH (ref 0.61–1.24)
GFR calc Af Amer: >60 mL/min (ref >60)
GFR calc non Af Amer: 56 mL/min — ABNORMAL LOW (ref >60)
Glucose, Bld: 124 mg/dL — ABNORMAL HIGH (ref 70–99)
Potassium: 3.6 mmol/L (ref 3.5–5.1)
Sodium: 140 mmol/L (ref 135–145)

## 2020-01-02 LAB — CBC
HCT: 44.4 % (ref 39.0–52.0)
Hemoglobin: 13.8 g/dL (ref 13.0–17.0)
MCH: 26.9 pg (ref 26.0–34.0)
MCHC: 31.1 g/dL (ref 30.0–36.0)
MCV: 86.5 fL (ref 80.0–100.0)
Platelets: 284 10*3/uL (ref 150–400)
RBC: 5.13 MIL/uL (ref 4.22–5.81)
RDW: 13.2 % (ref 11.5–15.5)
WBC: 13.5 10*3/uL — ABNORMAL HIGH (ref 4.0–10.5)
nRBC: 0 % (ref 0.0–0.2)

## 2020-01-02 LAB — ECHOCARDIOGRAM COMPLETE
Height: 71 in
Weight: 2400 oz

## 2020-01-02 LAB — LIPID PANEL
Cholesterol: 151 mg/dL (ref 0–200)
HDL: 45 mg/dL (ref >40)
LDL Cholesterol: 87 mg/dL (ref 0–99)
Total CHOL/HDL Ratio: 3.4 RATIO
Triglycerides: 97 mg/dL (ref <150)
VLDL: 19 mg/dL (ref 0–40)

## 2020-01-02 LAB — TROPONIN I (HIGH SENSITIVITY): Troponin I (High Sensitivity): 639 ng/L (ref <18)

## 2020-01-02 LAB — RESPIRATORY PANEL BY RT PCR (FLU A&B, COVID)
Influenza A by PCR: NEGATIVE
Influenza B by PCR: NEGATIVE
SARS Coronavirus 2 by RT PCR: NEGATIVE

## 2020-01-02 LAB — MAGNESIUM: Magnesium: 1.9 mg/dL (ref 1.7–2.4)

## 2020-01-02 LAB — CBG MONITORING, ED: Glucose-Capillary: 120 mg/dL — ABNORMAL HIGH (ref 70–99)

## 2020-01-02 MED ORDER — AMIODARONE HCL IN DEXTROSE 360-4.14 MG/200ML-% IV SOLN: 60.0000 mg/h | INTRAVENOUS | Status: DC

## 2020-01-02 MED ORDER — OXYCODONE HCL ER 10 MG PO T12A
20.0000 mg | EXTENDED_RELEASE_TABLET | Freq: Two times a day (BID) | ORAL | Status: DC
  Administered 2020-01-02 – 2020-01-03 (×2): 20 mg via ORAL
  Filled 2020-01-02 (×2): qty 2

## 2020-01-02 MED ORDER — SODIUM CHLORIDE 0.9% FLUSH: 10.0000 mL | INTRAVENOUS | Status: DC | PRN

## 2020-01-02 MED ORDER — ETOMIDATE 2 MG/ML IV SOLN
INTRAVENOUS | Status: AC
  Administered 2020-01-02: 20 mg
  Filled 2020-01-02: qty 10

## 2020-01-02 MED ORDER — FENTANYL CITRATE (PF) 100 MCG/2ML IJ SOLN
50.0000 ug | Freq: Once | INTRAMUSCULAR | Status: AC
  Administered 2020-01-02: 50 ug via INTRAVENOUS
  Filled 2020-01-02: qty 2

## 2020-01-02 MED ORDER — ETOMIDATE 2 MG/ML IV SOLN
12.0000 mg | Freq: Once | INTRAVENOUS | Status: AC
  Administered 2020-01-02: 12 mg via INTRAVENOUS

## 2020-01-02 MED ORDER — ADENOSINE 6 MG/2ML IV SOLN
INTRAVENOUS | Status: AC
  Filled 2020-01-02: qty 2

## 2020-01-02 MED ORDER — AMIODARONE LOAD VIA INFUSION
150.0000 mg | INTRAVENOUS | Status: DC
  Filled 2020-01-02: qty 83.34

## 2020-01-02 MED ORDER — AMIODARONE HCL IN DEXTROSE 360-4.14 MG/200ML-% IV SOLN
30.0000 mg/h | INTRAVENOUS | Status: DC
  Administered 2020-01-03: 30 mg/h via INTRAVENOUS
  Filled 2020-01-02: qty 200

## 2020-01-02 MED ORDER — AMIODARONE HCL IN DEXTROSE 360-4.14 MG/200ML-% IV SOLN
INTRAVENOUS | Status: AC
  Administered 2020-01-02: 21:00:00 60 mg/h via INTRAVENOUS
  Filled 2020-01-02: qty 200

## 2020-01-02 MED ORDER — SODIUM CHLORIDE 0.9% FLUSH
10.0000 mL | Freq: Two times a day (BID) | INTRAVENOUS | Status: DC
  Administered 2020-01-03 – 2020-01-04 (×4): 10 mL

## 2020-01-02 MED ORDER — SODIUM CHLORIDE 0.9 % IV BOLUS
500.0000 mL | Freq: Once | INTRAVENOUS | Status: AC
  Administered 2020-01-02: 500 mL via INTRAVENOUS

## 2020-01-02 NOTE — Progress Notes (Signed)
Attempted MRI at 9pm, pt's RN unable to come to MRI to monitor pt at this time.

## 2020-01-02 NOTE — ED Notes (Signed)
Amiodarone 150mg IVP given

## 2020-01-02 NOTE — ED Notes (Signed)
Second cardioversion of 200 J attempted

## 2020-01-02 NOTE — ED Notes (Signed)
Call placed to Dr. Kerney Elbe, Neuro regarding patient admission status to ICU.

## 2020-01-02 NOTE — Consult Note (Addendum)
Cardiology Consult    Patient ID: Travis Salinas MRN: [id number], DOB/AGE: 1951/05/30   Admit date: 01/01/2020 Date of Consult: 01/02/2020  Primary Physician: Kathyrn Lass, MD Primary Cardiologist: No primary care provider on file. Requesting Provider: Elnora Morrison, MD  Patient Profile    Travis Salinas is a 69 y.o. male with a history of COPD (emphysema), hepatitis C, HTN, CKD IIIa, and polysubstance use (heroin and cocaine) who presented yesterday with acute onset gait instability and neurologic deficits most consistent with acute stroke and is now s/p tPA. He is being seen today for the evaluation of wide complex tachycardia.  History of Present Illness    [name]. Wilfred Salinas reports tha the was talking to a friend yesterday when his legs suddenly gave out. He was helped to the floor by a friend then felt as though he was drifting to the left on standing, resulting in another fall. He was not dizzy during these episodes and denies syncope/LOC, palpitations, chest pain, or dyspnea during these episodes. He notes a 30 minute episode of chest pain 2-3 days ago that resolved on its own, but nothing since then.   Per neurology assessment, he was leaning to the left complaining of left-sided weakness and numbness. A Code Stroke was called and he was taken to CT which showed chronic small-vessel changes but did not reveal any acute abnormalities. While in CT he had severe gait ataxia with RUE sensory deficit and weakness with drifting to the right. Etiology felt to be consistent with acute ischemic stroke. He was given tPA at 22:15 last night and is being admitted to the ICU.   Labs were notable for cocaine + UDS, leukocytosis and polycythemia. TTE showed an LVEF of 35-40% with severe hypokinesis of the entire LV apex extending into the mid-segments, with differential including stress cardiomyopathy. Initial ECG on my review showed NSR with narrow QRS, possible anterior infarct, and nonspecific  ST-T wave abnormalities, cannot rule out ischemia.  While in the ED tonight (awaiting bed) he was noted to have a wide complex tachycardia with rates as high as 140. Electrical cardioversion was attempted twice with return to the same rhythm. He remains in this rhythm now and denies any chest pain currently or leading up to his hospitalization. He does not feel any significant palpitations, maybe slight sensation of his heart beating fast when pushed. He denies any dyspnea or orthopnea.  He has no prior cath or known CAD. He underwent exercise nuclear stress test in 2018 for exertional dyspnea (has COPD) that was low risk and showed normal LV function. TTE later that year also showed preserved LV function. He did have a CT chest last year that showed evidence of 3-vessel atherosclerosis.   Past Medical History   Past Medical History:  Diagnosis Date  . Arthritis   . Cervical disc herniation   . COPD (chronic obstructive pulmonary disease) ([place])   . Headache    " due to antibiotics"  . Hepatitis C   . Hypertension     Past Surgical History:  Procedure Laterality Date  . APPLICATION OF A-CELL OF EXTREMITY Left 12/05/2016   Procedure: APPLICATION OF A-CELL OF EXTREMITY;  Surgeon: Loel Lofty Dillingham, DO;  Location: [facility];  Service: Plastics;  Laterality: Left;  . APPLICATION OF WOUND VAC Left 12/05/2016   Procedure: APPLICATION OF WOUND VAC;  Surgeon: Loel Lofty Dillingham, DO;  Location: [facility];  Service: Plastics;  Laterality: Left;  . I & D EXTREMITY  Left 11/03/2016   Procedure: IRRIGATION AND DEBRIDEMENT EXTREMITY;  Surgeon: Leandrew Koyanagi, MD;  Location: [facility] [facility];  Service: Orthopedics;  Laterality: Left;  . I & D EXTREMITY Left 12/05/2016   Procedure: IRRIGATION AND DEBRIDEMENT EXTREMITY;  Surgeon: Loel Lofty Dillingham, DO;  Location: [facility];  Service: Plastics;  Laterality: Left;  . INCISION AND DRAINAGE ABSCESS Right 11/12/2013   Procedure: INCISION AND DRAINAGE AND OPEN PACKING OF RIGHT CALF   ABSCESS;  Surgeon: Earnstine Regal, MD;  Location: [facility];  Service: General;  Laterality: Right;  . INCISION AND DRAINAGE OF WOUND Left 01/29/2017   Procedure: IRRIGATION AND DEBRIDEMENT OF LEFT LEG WOUND WITH ABRA PLACEMENT AND PLACEMENT OF WOUND VAC;  Surgeon: Wallace Going, DO;  Location: [facility];  Service: Plastics;  Laterality: Left;  . KNEE SURGERY       Allergies  Allergen Reactions  . Propoxyphene Nausea And Vomiting  . Amoxicillin Nausea And Vomiting  . Ciprofloxacin Nausea Only  . Depakote [Divalproex Sodium] Other (See Comments)    hallucinations   Inpatient Medications    . adenosine      . etomidate      .  stroke: mapping our early stages of recovery book   Does not apply Once  . amantadine  50 mg Oral BID  . ARIPiprazole  10 mg Oral Daily  . benzonatate  200 mg Oral TID  . clonazePAM  1 mg Oral BID  . etomidate  12 mg Intravenous Once  . fentaNYL (SUBLIMAZE) injection  50 mcg Intravenous Once  . FLUoxetine  10 mg Oral Daily  . ipratropium-albuterol  3 mL Nebulization TID  . lamoTRIgine  200 mg Oral q morning - 10a  . oxyCODONE  20 mg Oral Q12H  . pantoprazole (PROTONIX) IV  40 mg Intravenous QHS  . sodium chloride flush  3 mL Intravenous Once  . tamsulosin  0.4 mg Oral Daily    Family History    Family History  Problem Relation Age of Onset  . Heart disease Mother   . Stroke Mother   . Heart disease Father   . Stroke Father    He indicated that his mother is deceased. He indicated that his father is deceased. He indicated that his sister is alive. He indicated that his brother is alive.   Social History    Social History   Socioeconomic History  . Marital status: Married    Spouse name: Not on file  . Number of children: Not on file  . Years of education: Not on file  . Highest education level: Not on file  Occupational History  . Not on file  Tobacco Use  . Smoking status: Former Smoker    Packs/day: 1.00    Years: 32.00    Pack years:  32.00    Types: Cigarettes    Quit date: 2002    Years since quitting: 19.3  . Smokeless tobacco: Never Used  Substance and Sexual Activity  . Alcohol use: No    Alcohol/week: 0.0 standard drinks  . Drug use: Yes    Types: Heroin, Cocaine    Comment: " relapsed once this year " 2018  . Sexual activity: Not on file  Other Topics Concern  . Not on file  Social History Narrative  . Not on file   Social Determinants of Health   Financial Resource Strain:   . Difficulty of Paying Living Expenses:   Food Insecurity:   . Worried About Running  Out of Food in the Last Year:   . [place] in the Last Year:   Transportation Needs:   . Lack of Transportation (Medical):   Marland Kitchen Lack of Transportation (Non-Medical):   Physical Activity:   . Days of Exercise per Week:   . Minutes of Exercise per Session:   Stress:   . Feeling of Stress :   Social Connections:   . Frequency of Communication with Friends and Family:   . Frequency of Social Gatherings with Friends and Family:   . Attends Religious Services:   . Active Member of Clubs or Organizations:   . Attends Archivist Meetings:   Marland Kitchen Marital Status:   Intimate Partner Violence:   . Fear of Current or Ex-Partner:   . Emotionally Abused:   Marland Kitchen Physically Abused:   . Sexually Abused:      Review of Systems    A comprehensive review of 10 systems was performed with pertinent positives and negatives noted in the HPI.  Physical Exam    Blood pressure 130/62, pulse 86, temperature 98.2 F (36.8 C), resp. rate 17, height 5\' 11"  (1.803 m), weight 68 kg, SpO2 97 %.     Intake/Output Summary (Last 24 hours) at 01/02/2020 1933 Last data filed at 01/02/2020 1400 Gross per 24 hour  Intake --  Output 400 ml  Net -400 ml   Wt Readings from Last 3 Encounters:  01/01/20 68 kg  10/21/19 63.6 kg  05/27/19 66 kg   CONSTITUTIONAL: alert and conversant, somewhat chronically ill appearing, but in no acute distress HEENT: nasal  bridge abrasion, poor dentition, conjunctiva normal, EOM intact, pupils equal.  NECK: no apparent masses, no JVD but prominent v waves CARDIOVASCULAR: Regular rhythm, rate 120s. No gallop, murmur, or rub. Radial pulses 2+.  PULMONARY/CHEST WALL: no deformities, lungs are clear but with diminished breath sounds bilaterally ABDOMINAL: soft, non-tender, non-distended EXTREMITIES: no edema, warm and well-perfused NEUROLOGIC: alert, no abnormal movements, cranial nerves grossly intact.    Labs    Cardiac Panel (last 3 results) Recent Labs    01/02/20 1934  TROPONINIHS 639*    Lab Results  Component Value Date   WBC 14.8 (H) 01/01/2020   HGB 18.4 (H) 01/01/2020   HCT 54.0 (H) 01/01/2020   MCV 87.8 01/01/2020   PLT 284 01/01/2020    Recent Labs  Lab 01/01/20 2210 01/01/20 2210 01/01/20 2232  NA 137   < > 139  K 4.1   < > 3.8  CL 101   < > 105  CO2 20*  --   --   BUN 41*   < > 42*  CREATININE 1.44*   < > 1.50*  CALCIUM 10.3  --   --   PROT 7.3  --   --   BILITOT 1.3*  --   --   ALKPHOS 106  --   --   ALT 36  --   --   AST 58*  --   --   GLUCOSE 99   < > 94   < > = values in this interval not displayed.   Lab Results  Component Value Date   CHOL 151 01/02/2020   HDL 45 01/02/2020   [place] 87 01/02/2020   TRIG 97 01/02/2020    Radiology Studies   CT HEAD CODE STROKE WO CONTRAST Result Date: 01/01/2020 FINDINGS: Brain: Mild generalized atrophy. Chronic small-vessel ischemic changes of the white matter. No sign of acute infarction, mass  lesion, hemorrhage, hydrocephalus or extra-axial collection. Vascular: There is atherosclerotic calcification of the major vessels at the base of the brain. Skull: Normal Sinuses/Orbits: Clear/normal. Insignificant retention cyst left maxillary sinus. Other: None ASPECTS ([place] Stroke Program Early CT Score) - Ganglionic level infarction (caudate, lentiform nuclei, internal capsule, insula, M1-M3 cortex): 7 - Supraganglionic infarction  (M4-M6 cortex): 3 Total score (0-10 with 10 being normal): 10  IMPRESSION:  1. No acute head CT finding. Chronic small-vessel changes of the white matter.  2. ASPECTS is 10 3.    ECG & Cardiac Imaging    ECHOCARDIOGRAM COMPLETE Result Date: 01/02/2020 FINDINGS  Left Ventricle: Left ventricular ejection fraction, by estimation, is 35 to 40%. The left ventricle has moderately decreased function. The left ventricle demonstrates regional wall motion abnormalities. The left ventricular internal cavity size was normal in size. There is moderate left ventricular hypertrophy. Left ventricular diastolic parameters are consistent with Grade I diastolic dysfunction (impaired relaxation).  LV Wall Scoring: The entire apex is akinetic. Has Takotsubo like appearance (stress induced cardiomyopathy). Right Ventricle: The right ventricular size is normal. No increase in right ventricular wall thickness. Right ventricular systolic function is normal. Left Atrium: Left atrial size was normal in size. Right Atrium: Right atrial size was normal in size. Pericardium: There is no evidence of pericardial effusion. Mitral Valve: The mitral valve is normal in structure. Normal mobility of the mitral valve leaflets. Trivial mitral valve regurgitation. No evidence of mitral valve stenosis. Tricuspid Valve: The tricuspid valve is normal in structure. Tricuspid valve regurgitation is not demonstrated. No evidence of tricuspid stenosis. Aortic Valve: The aortic valve is normal in structure. Aortic valve regurgitation is not visualized. No aortic stenosis is present. Pulmonic Valve: The pulmonic valve was normal in structure. Pulmonic valve regurgitation is mild. No evidence of pulmonic stenosis. Aorta: The aortic root is normal in size and structure. Venous: The inferior vena cava is normal in size with greater than 50% respiratory variability, suggesting right atrial pressure of 3 mmHg. IAS/Shunts: No atrial level shunt detected by  color flow Doppler IMPRESSIONS  1. Left ventricular ejection fraction, by estimation, is 35 to 40%. The left ventricle has moderately decreased function. The left ventricle demonstrates regional wall motion abnormalities (see scoring diagram/findings for description). There is moderate left ventricular hypertrophy. Left ventricular diastolic parameters are consistent with Grade I diastolic dysfunction (impaired relaxation). Has Takotsubo like appearance (stress induced cardiomyopathy).  2. Right ventricular systolic function is normal. The right ventricular size is normal.  3. The mitral valve is normal in structure. Trivial mitral valve regurgitation. No evidence of mitral stenosis.  4. The aortic valve is normal in structure. Aortic valve regurgitation is not visualized. No aortic stenosis is present.  5. The inferior vena cava is normal in size with greater than 50% respiratory variability, suggesting right atrial pressure of 3 mmHg.  No intracardiac source of embolism detected on this transthoracic study. A transesophageal echocardiogram is recommended to exclude cardiac source of embolism if clinically indicated.   ECG 2/24, 22:15 - Initial ECG on my review showed NSR with narrow QRS, possible anterior infarct, and nonspecific ST-T wave abnormalities, cannot rule out ischemia.  ECG 2/25, 19:07 - WCT, rate 136, LBBB pattern, QRS ~ 180 ms, V4-V5 transition, superior axis, overall suspicious for VT - personally reviewed.  ECG 2/25, 19:56 - WCT, rate 127, LBBB, no discernable atrial activity; more irregular, rate has slowed slightly and QRS appears more narrow though axis unchanged. Overall more suggestive of atrial rhythm with  new LBBB.  Telemetry reviewed: sinus rhythm with wide QRS, gradually increasing rate to 110s, then is disconnected. When monitor is reconnected the patient is in a WCT with a rate in the 140s with similar axis though no atrial rhythm is discernable at this point. The patient is  cardioverted twice with conducted atrial rhythm noted which then accelerates into the 120s. There are 3 episodes of brief nonsustained PMVT that interrupt the monomorphic tachycardia and reveal an underlying conducted atrial rhythm which again accelerates each time. Overall, this suggests a baseline sinus tachycardia with LBBB with episodes of brief non-sustained PMVT.   Assessment & Plan    This is a 69 y.o. male with a history of COPD, hepatitis C, HTN, CKD IIIa, and active polysubstance use (heroin and cocaine) who presented with an acute stroke treated with tPA about 24 hours ago. While his initial ECG had some potential ischemic changes and TTE showed severe apical hypokinesis, he has not had any chest pain while in the ED or leading up to his hospitalization. It is possible that this represents a stress cardiomyopathy or recent anterior infarct (late presentation/completed infarct). The wide complex tachycardia appears to be predominantly sinus rhythm with a rate-dependent wide LBBB, though there is also intermittent PMVT. He has evidence of coronary disease on prior CT and given his echo findings, new LBBB, and PMVT, a non-emergent left heart cath would be indicated. However, given that he has remained chest pain free, recently received tPA, and is just 24 hours out from an acute stroke, do not recommend invasive coronary angiography at this time. This was discussed with interventional cardiology who is in agreement. For now, we will start AAD therapy to suppress the PMVT. Specific recommendations as outlined below:  - Amiodarone bolus and infusion - Would start lidocaine with 100mg  bolus followed by 1.5 mg/min infusion if he develops any sustained VT or recurrent PMVT on amiodarone.  - Start low-dose carvedilol when okay from stroke standpoint, assuming permissive hypertension is preferred at this time. - No indication for heparin at this time - Aspirin and high-intensity statin per neurology -  Would hold off on P2Y12 inhibitor if possible given potential for 3-vessel CAD and need for CABG. - Left heart cath prior to discharge when able from stroke standpoint, non-emergent as long as he continues to be chest-pain free. - Cannot completely rule out atrial fibrillation with LBBB on some of the telemetry strips. Consider TEE if embolic stroke is suspected.   Signed, Marykay Lex, MD 01/02/2020, 7:33 PM  For questions or updates, please contact   Please consult [email or web address] for contact info under Cardiology/STEMI.

## 2020-01-02 NOTE — ED Notes (Signed)
Pt remains in V-tach post cardioversion

## 2020-01-02 NOTE — ED Notes (Signed)
Patient assisted back to bed, patient reports feeling dizzy, O2 provided via [place] at 4LPM. Connected to bedside monitor. Patient noted with new cardiac rhythm change, Wide complex tachycardia noted rate 142

## 2020-01-02 NOTE — Progress Notes (Signed)
  Echocardiogram 2D Echocardiogram has been performed.  Merrie Roof F 01/02/2020, 3:11 PM

## 2020-01-02 NOTE — ED Notes (Signed)
IV team at bedside attempting to place PIV

## 2020-01-02 NOTE — ED Provider Notes (Signed)
Patient being monitored until critical care bed available.  Patient was seen yesterday for code stroke and symptoms and signs have improved since TPA and monitoring.  While being monitored and taken care of in the emergency room patient developed rapid heart rate in general fatigue.  Patient's blood pressure still normal, generally feels unwell.  Patient denies any chest pain or shortness of breath the past 2 days.  EKG reviewed revealing wide, regular, significant ST elevations consistent with ventricular tachycardia rate 138.  Previous EKG reviewed showing ST changes however no wide rhythm noted.  Discussed concerns and differentials including ventricular tachycardia, abberancy, other with patient.  Recommended emergent cardioversion to try to get him out of this rhythm.  Discussed risks and benefits and patient agrees to proceed. Patient was sedated with etomidate, fentanyl and shocked synchronized 200 J twice with decrease in heart rate however rhythm still wide.  Amiodarone bolus given.  Cardiology consulted and recommends continued amiodarone to suppress rhythm as they are not planning to take in the Cath Lab at this time since he recently got TPA.  Updated family and patient.  Patient monitored closely afterwards returned to baseline after sedation.  Paged neurology to update.  Cardiology is on consult.  .Critical Care Performed by: Elnora Morrison, MD Authorized by: Elnora Morrison, MD   Critical care provider statement:    Critical care time (minutes):  75   Critical care start time:  01/02/2020 8:00 PM   Critical care end time:  01/02/2020 9:15 PM   Critical care time was exclusive of:  Separately billable procedures and treating other patients and teaching time   Critical care was necessary to treat or prevent imminent or life-threatening deterioration of the following conditions:  Cardiac failure   Critical care was time spent personally by me on the following activities:  Discussions with  consultants, evaluation of patient's response to treatment, examination of patient, ordering and performing treatments and interventions, ordering and review of laboratory studies, ordering and review of radiographic studies, pulse oximetry, re-evaluation of patient's condition, obtaining history from patient or surrogate and review of old charts   I assumed direction of critical care for this patient from another provider in my specialty: no   .Sedation  Date/Time: 01/02/2020 9:43 PM Performed by: Elnora Morrison, MD Authorized by: Elnora Morrison, MD   Consent:    Consent obtained:  Verbal   Consent given by:  Patient   Risks discussed:  Allergic reaction, dysrhythmia, inadequate sedation, nausea, prolonged hypoxia resulting in organ damage, prolonged sedation necessitating reversal, respiratory compromise necessitating ventilatory assistance and intubation and vomiting Universal protocol:    Procedure explained and questions answered to patient or proxy's satisfaction: yes     Relevant documents present and verified: yes     Test results available and properly labeled: yes     Imaging studies available: yes     Required blood products, implants, devices, and special equipment available: yes     Site/side marked: yes     Immediately prior to procedure a time out was called: yes   Indications:    Procedure performed:  Cardioversion Pre-sedation assessment:    Time since last food or drink:  4 hr   NPO status caution: urgency dictates proceeding with non-ideal NPO status     ASA classification: class 3 - patient with severe systemic disease     Neck mobility: normal     Mouth opening:  3 or more finger widths   Thyromental distance:  3 finger  widths   Mallampati score:  I - soft palate, uvula, fauces, pillars visible   Pre-sedation assessments completed and reviewed: pre-procedure airway patency not reviewed     Pre-sedation assessment completed:  01/02/2020 8:00 PM Immediate  pre-procedure details:    Reassessment: Patient reassessed immediately prior to procedure     Reviewed: vital signs, relevant labs/tests and NPO status     Verified: bag valve mask available, emergency equipment available, intubation equipment available, IV patency confirmed, oxygen available, reversal medications available and suction available   Procedure details (see MAR for exact dosages):    Preoxygenation:  Nasal cannula   Sedation:  Etomidate   Intended level of sedation: deep   Analgesia:  Fentanyl   Intra-procedure monitoring:  Blood pressure monitoring, cardiac monitor, continuous capnometry, continuous pulse oximetry, frequent LOC assessments and frequent vital sign checks   Intra-procedure events: none     Intra-procedure management:  Fluid bolus   Total Provider sedation time (minutes):  22 Post-procedure details:    Post-sedation assessment completed:  01/02/2020 9:15 PM   Attendance: Constant attendance by certified staff until patient recovered     Recovery: Patient returned to pre-procedure baseline     Post-sedation assessments completed and reviewed: airway patency not reviewed, cardiovascular function not reviewed, hydration status not reviewed, mental status not reviewed, nausea/vomiting not reviewed, pain level not reviewed, respiratory function not reviewed and temperature not reviewed     Patient is stable for discharge or admission: yes     Patient tolerance:  Tolerated well, no immediate complications Comments:     Cardioversion  .Cardioversion  Date/Time: 01/02/2020 9:45 PM Performed by: Elnora Morrison, MD Authorized by: Elnora Morrison, MD   Consent:    Consent obtained:  Verbal   Consent given by:  Patient   Risks discussed:  Cutaneous burn, death, induced arrhythmia and pain   Alternatives discussed:  No treatment and rate-control medication Pre-procedure details:    Cardioversion basis:  Emergent   Rhythm:  Ventricular tachycardia   Electrode placement:   Anterior-posterior Patient sedated: Yes. Refer to sedation procedure documentation for details of sedation.  Attempt one:    Cardioversion mode:  Synchronous   Waveform:  Biphasic   Shock (Joules):  200   Shock outcome:  No change in rhythm Post-procedure details:    Patient status:  Awake   Patient tolerance of procedure:  Tolerated well, no immediate complications   Blood work reviewed and recent, troponin elevated 639.  Ventricular Tachycardia Acute Stroke   Elnora Morrison, MD 01/02/20 2150

## 2020-01-02 NOTE — ED Notes (Signed)
12-lead captured, EDP notified.

## 2020-01-02 NOTE — ED Notes (Signed)
50 mcg fentanyl given IVP

## 2020-01-02 NOTE — ED Notes (Signed)
150 mg Amiodarone loading dose given by previous nurse after cardioversion.

## 2020-01-02 NOTE — ED Notes (Signed)
This RN attempted to insert a PIV twice (once with [employer]) with no success; Abe People, RN made aware.

## 2020-01-02 NOTE — Progress Notes (Addendum)
S/O: This evening, while still in the ED, the patient developed wide complex ventricular tachycardia at 7:20 PM requiring electrical cardioversion attempts with 200 J x 2 in conjunction with amiodarone (150 mg IV) without return to normal sinus rhythm. Cardiology was then consulted.    Coronavirus IgG was reactive. No rapid Covid testing has been ordered.   Labs: Electrolytes at 7:34 PM: Na 140, K 3.6, Ca 9.1 (all normal).   Abnormal BUN of 29 and Cr 1.31.   Troponin elevated at 639.   Of note, he was cocaine positive in 2018.   A/R: 69 year old male s/p tPA for stroke. Now with wide complex tachycardia.  -- Cardiology has been consulted. I have reviewed their preliminary assessment, which notes that he had development of a wide complex tachycardia in the ED with rates as high as 140. Electrical cardioversion was attempted twice with return to the same rhythm. At the time of the Cardiologist's evaluation, the patient continues to be in this rhythm but denies any chest pain currently or leading up to his hospitalization. He does not feel any significant palpitations, maybe slight sensation of his heart beating fast. He denies any dyspnea or orthopnea. Await final Cardiology recommendations.  -- Urine toxicology screen has been ordered -- Continuing to wait for a hospital bed. He will need an ICU bed due to arrhythmia requiring amiodarone treatment.   Addendum: -- Arrhythmia. Appreciate Cardiology input:  -- Amiodarone infusion has been started for the patient's arrhythmia.   -- The patient has been admitted to the ICU, 2M06. This is necessary due to amiodarone infusion  -- Cardiology recommends starting lidocaine with 100mg  bolus followed by 1.5 mg/min infusion if he develops any sustained VT or recurrent PMVT on amiodarone. This has not recurred overnight. He has a bundle branch block currently. The patient is asymptomatic at this time.   -- Recommended starting low-dose carvedilol when okay  from stroke standpoint. Carvedilol 3.125 mg po BID has been ordered.   - Cardiology would like [place] to hold off on P2Y12 inhibitor if possible given potential for 3-vessel CAD and need for CABG.  - Left heart cath prior to discharge when able from stroke standpoint is also recommended by Cardiology. Stroke Team to schedule in the AM.   - Cannot completely rule out atrial fibrillation with LBBB on some of the telemetry strips. AM Stroke Team to consider TEE if embolic stroke is suspected.  -- ID: Covid nasal swab came back negative -- Stroke:   -- Time since IV tPA infusion is now > 24 hours. Repeat CT head has not yet been obtained. Will start ASA if CT is negative for hemorrhagic conversion. I have called his RN and plan is to obtain CT head after shift change this morning.   -- Starting atorvastatin. Obtaining baseline CK level.   Electronically signed: Dr. Kerney Elbe

## 2020-01-02 NOTE — ED Notes (Signed)
12 mg etomidate given IVP

## 2020-01-02 NOTE — ED Notes (Signed)
Pt. Awake and alert. Denies any chest pain, no shortness of breath or dizziness.

## 2020-01-02 NOTE — ED Notes (Signed)
[place], Wife  Would like an update

## 2020-01-02 NOTE — ED Notes (Signed)
200 J synchronized cardioversion attempted.

## 2020-01-02 NOTE — Progress Notes (Signed)
PT Cancellation Note  Patient Details Name: Travis Salinas MRN: [id number] DOB: 16-Jul-1951   Cancelled Treatment:    Reason Eval/Treat Not Completed: Active bedrest order. Pt given TPA at 2215 on 4/24. Pt on strict bed rest at this time due to bleeding risk. Pt also with plan for ICU admission. PT will hold at this time until pt is medically stable and off of bed rest.   Zenaida Niece 01/02/2020, 8:04 AM

## 2020-01-02 NOTE — ED Notes (Signed)
Pt remains in vtach

## 2020-01-02 NOTE — ED Notes (Signed)
Pt. Assisted to bedside camode. No dizziness at this time.

## 2020-01-02 NOTE — ED Provider Notes (Signed)
.Sedation  Date/Time: 01/02/2020 8:53 PM Performed by: Filbert Berthold, MD Authorized by: Elnora Morrison, MD   Consent:    Consent obtained:  Verbal and emergent situation   Consent given by:  Patient   Risks discussed:  Allergic reaction, dysrhythmia, inadequate sedation, nausea, prolonged hypoxia resulting in organ damage, respiratory compromise necessitating ventilatory assistance and intubation and vomiting   Alternatives discussed:  Analgesia without sedation Universal protocol:    Procedure explained and questions answered to patient or proxy's satisfaction: yes     Relevant documents present and verified: yes     Test results available and properly labeled: yes     Imaging studies available: yes     Required blood products, implants, devices, and special equipment available: yes     Site/side marked: yes     Immediately prior to procedure a time out was called: yes     Patient identity confirmation method:  Arm band and verbally with patient Indications:    Procedure performed:  Cardioversion   Procedure necessitating sedation performed by:  Physician performing sedation Pre-sedation assessment:    Time since last food or drink:  4   ASA classification: class 3 - patient with severe systemic disease     Neck mobility: normal     Mouth opening:  3 or more finger widths   Thyromental distance:  3 finger widths   Mallampati score:  II - soft palate, uvula, fauces visible   Pre-sedation assessments completed and reviewed: airway patency, cardiovascular function, hydration status, mental status, nausea/vomiting, pain level and respiratory function     Pre-sedation assessment completed:  01/02/2020 7:30 PM Immediate pre-procedure details:    Reassessment: Patient reassessed immediately prior to procedure     Verified: bag valve mask available, emergency equipment available, intubation equipment available, IV patency confirmed and suction available   Procedure details (see MAR for  exact dosages):    Preoxygenation:  Nasal cannula   Sedation:  Etomidate   Intended level of sedation: deep   Analgesia:  Fentanyl   Intra-procedure monitoring:  Cardiac monitor, blood pressure monitoring, continuous capnometry, continuous pulse oximetry, frequent vital sign checks and frequent LOC assessments   Intra-procedure events: none     Total Provider sedation time (minutes):  15 Post-procedure details:    Post-sedation assessment completed:  01/02/2020 8:56 PM   Attendance: Constant attendance by certified staff until patient recovered     Post-sedation assessments completed and reviewed: airway patency, cardiovascular function, hydration status, mental status, nausea/vomiting, pain level and respiratory function     Patient is stable for discharge or admission: yes     Patient tolerance:  Tolerated well, no immediate complications .Cardioversion  Date/Time: 01/02/2020 8:57 PM Performed by: Filbert Berthold, MD Authorized by: Elnora Morrison, MD   Consent:    Consent obtained:  Verbal and emergent situation   Consent given by:  Patient   Risks discussed:  Cutaneous burn, death, induced arrhythmia and pain   Alternatives discussed:  No treatment, alternative treatment and rate-control medication Universal protocol:    Procedure explained and questions answered to patient or proxy's satisfaction: yes     Relevant documents present and verified: yes     Test results available and properly labeled: yes     Imaging studies available: yes     Required blood products, implants, devices, and special equipment available: yes     Site/side marked: yes     Immediately prior to procedure a time out was called: yes  Patient identity confirmed:  Verbally with patient and arm band Pre-procedure details:    Cardioversion basis:  Emergent   Rhythm:  Ventricular tachycardia   Electrode placement:  Anterior-posterior Patient sedated: Yes. Refer to sedation procedure documentation for  details of sedation.  Attempt one:    Cardioversion mode:  Synchronous   Waveform:  Biphasic   Shock (Joules):  200   Shock outcome:  No change in rhythm Attempt two:    Cardioversion mode:  Synchronous   Waveform:  Biphasic   Shock (Joules):  200   Shock outcome:  No change in rhythm Post-procedure details:    Patient status:  Awake   Patient tolerance of procedure:  Tolerated well, no immediate complications      Filbert Berthold, MD 01/02/20 2058    Elnora Morrison, MD 01/03/20 0003

## 2020-01-02 NOTE — Progress Notes (Signed)
STROKE TEAM PROGRESS NOTE   HISTORY OF PRESENT ILLNESS (per record) Travis Salinas is an 70 y.o. male who presents acutely from home after having several falls. The falls per patient were due to sudden onset of gait instability, occurring at 1830. He was talking to a friend at that time when his legs suddenly gave out. There was no dizziness and no LOC. He was helped to the floor by his friend. After he got up, he felt as though he was drifting to the left. He then started falling backwards and after several steps backwards he fell and hit his nose. EMS was called and he was transported to the ED.  He has felt dehydrated. He was orthostatic with BPs ranging from 110/62 to 102/58. On arrival to the ED, he was noted to be leaning to the left. He also was complaining of left sided weakness and sensory numbness. Code Stroke was called.  In CT, he had BLE ataxia, worse on the right. He also had an unsteady gait requiring assistance. The gait was wide based and ataxic with drifting to the right and BLE weakness.  He denies vision changes, speech problems, neck pain or back pain. He has a history of "2 ruptured discs in my neck" but no current neck symptoms. He did not have acute back pain at the time of onset of his stroke symptoms.   The patient has no prior history of stroke or MI. He denies using EtOH.  LSN: [id number] tPA Given: Yes   INTERVAL HISTORY His family is not at bedside.  Patient states he was standing in his kitchen and all of a sudden his left leg became numb and weak causing him to fall.  Currently he can move his leg and he denies any numbness.  Status post TPA.  Pending MRI of the brain, CT of the head was negative.Cocaine + UDS.    OBJECTIVE Vitals:   01/02/20 0530 01/02/20 0545 01/02/20 0600 01/02/20 0615  BP: 123/86 118/83 115/83 123/81  Pulse: 69 70 70 73  Resp: 13 13 14 13   Temp:      TempSrc:      SpO2: 95% 96% 95% 96%  Weight:      Height:        CBC:  Recent Labs   Lab 01/01/20 2210 01/01/20 2232  WBC 14.8*  --   NEUTROABS 9.5*  --   HGB 16.7 18.4*  HCT 54.5* 54.0*  MCV 87.8  --   PLT 284  --     Basic Metabolic Panel:  Recent Labs  Lab 01/01/20 2210 01/01/20 2232  NA 137 139  K 4.1 3.8  CL 101 105  CO2 20*  --   GLUCOSE 99 94  BUN 41* 42*  CREATININE 1.44* 1.50*  CALCIUM 10.3  --     Lipid Panel:     Component Value Date/Time   CHOL 151 01/02/2020 0329   TRIG 97 01/02/2020 0329   HDL 45 01/02/2020 0329   CHOLHDL 3.4 01/02/2020 0329   VLDL 19 01/02/2020 0329   LDLCALC 87 01/02/2020 0329   HgbA1c:  Lab Results  Component Value Date   HGBA1C 5.9 (H) 06/14/2017   Urine Drug Screen:     Component Value Date/Time   LABOPIA POSITIVE (A) 05/27/2017 0837   COCAINSCRNUR POSITIVE (A) 05/27/2017 0837   LABBENZ NONE DETECTED 05/27/2017 0837   AMPHETMU NONE DETECTED 05/27/2017 0837   THCU NONE DETECTED 05/27/2017 0837   LABBARB NONE  DETECTED 05/27/2017 0837    Alcohol Level     Component Value Date/Time   [facility]  05/10/2010 1230    <5        LOWEST DETECTABLE LIMIT FOR SERUM ALCOHOL IS 5 mg/dL FOR MEDICAL PURPOSES ONLY    IMAGING  CT Cervical Spine Wo Contrast 01/01/2020 IMPRESSION:  1. No acute osseous abnormality of the cervical spine.  2. Moderate to marked severity multilevel degenerative changes, most prominent at the levels of C5-C6 and C6-C7.  3. Moderate severity emphysematous lung disease involving the bilateral upper lobes.  Emphysema (ICD10-J43.9).   CT HEAD CODE STROKE WO CONTRAST 01/01/2020 IMPRESSION:  1. No acute head CT finding. Chronic small-vessel changes of the white matter.  2. ASPECTS is 10   CT Maxillofacial Wo Contrast 01/01/2020 IMPRESSION:  1. No acute intracranial abnormality.  2. Mild cerebral atrophy and microvascular disease changes of the supratentorial brain.  3. Marked severity left maxillary sinus mucosal thickening.   MRI / MRA Head - pending   Transthoracic Echocardiogram   00/00/2021 Pending  Bilateral Carotid Dopplers  00/00/2021 Pending   ECG - SR rate 76 BPM. (See cardiology reading for complete details)   PHYSICAL EXAM Blood pressure 123/81, pulse 73, temperature 97.8 F (36.6 C), resp. rate 13, height 5\' 11"  (1.803 m), weight 68 kg, SpO2 96 %.  Exam: NAD, poor grooming, appears older than stated age              Speech:    Speech is normal; fluent and spontaneous with normal comprehension. Attention normal.  Cognition:    The patient is oriented to person, place, and time;     recent and remote memory intact;     language fluent;    Cranial Nerves:    The pupils are equal, round, and reactive to light.Trigeminal sensation is intact and the muscles of mastication are normal. The face is symmetric. The palate elevates in the midline. Hearing intact. Voice is normal. Shoulder shrug is normal. The tongue has normal motion without fasciculations. Voice and hearing intact.   Coordination:  Ataxia in the lower extremities  Motor Observation:    No asymmetry, no atrophy, and no involuntary movements noted. Tone:    Normal muscle tone.     Strength:    Strength is V/V in the upper and lower limbs.      Sensation: intact to LT  Gait: Deferred        ASSESSMENT/PLAN [name] is a 69 y.o. male with history of Htn, hepatitis C, Cervical disc herniation, and COPD presenting with gait instability, several falls, left sided numbness and bilateral LE weakness with ataxia. The patient received IV t-PA Saturday 01/01/20 at 22:15. +Cocaine in UDS.   Stroke:  MRI pending  Code Stroke CT Head - No acute head CT finding. Chronic small-vessel changes of the white matter. ASPECTS is 10   CT head - not ordered  MRI head - pending  MRA head - pending  CTA H&N - not ordered  CT Perfusion - not ordered  Carotid Doppler - pending  2D Echo - pending  Sars Corona Virus 2 - pending  LDL - 87  HgbA1c - 5.9  UDS - not  ordered  HIV - pending  Troponin - pending  VTE prophylaxis - SCDs Diet  Diet Order            Diet NPO time specified  Diet effective now  No antithrombotic prior to admission, now on No antithrombotic s/p tPA. Start ASA 24 hours.   Patient will counseled to be compliant with his antithrombotic medications  Ongoing aggressive stroke risk factor management  Therapy recommendations:  pending  Disposition:  Pending  Hypertension  Home BP meds: none   Current BP meds: none   Stable . SBP goal < 180 mm Hg initially after tPA . Long-term BP goal normotensive  Hyperlipidemia  Home Lipid lowering medication: none   LDL 87, goal < 70  Current lipid lowering medication: none / NPO   Continue statin at discharge  Other Stroke Risk Factors  Advanced age  Former cigarette smoker - quit  Family hx stroke (mother and father)  Substance Abuse - heroin and cocaine  Other Active Problems  Code status - Full code  COPD / Emphysema (ICD10-J43.9)  Marked severity left maxillary sinus mucosal thickening  Moderate to marked severity multilevel degenerative changes, most prominent at the levels of C5-C6 and C6-C7  Leukocytosis - 14.8 (afebrile)  CKD stage 3a - creatinine - 1.44->1.50   Hospital day # 1  Personally examined patient and images, and have participated in and made any corrections needed to history, physical, neuro exam,assessment and plan as stated above.  I have personally obtained the history, evaluated lab date, reviewed imaging studies and agree with radiology interpretations.    Sarina Ill, MD Stroke Neurology   I spent 35  minutes of face-to-face and non-face-to-face time with patient on the This included previsit chart review, lab review, study review, order entry, electronic health record documentation, patient education on the different diagnostic and therapeutic options, counseling and coordination of care, risks and  benefits of management, compliance, or risk factor reduction  To contact Stroke Continuity provider, please refer to [email or web address] After hours, contact General Neurology

## 2020-01-02 NOTE — ED Notes (Signed)
Travis Salinas, wife, wants to be updated in AM. [phone]

## 2020-01-03 ENCOUNTER — Inpatient Hospital Stay (HOSPITAL_COMMUNITY): Payer: PPO

## 2020-01-03 DIAGNOSIS — I447 Left bundle-branch block, unspecified: Secondary | ICD-10-CM

## 2020-01-03 DIAGNOSIS — I472 Ventricular tachycardia: Secondary | ICD-10-CM | POA: Diagnosis not present

## 2020-01-03 DIAGNOSIS — F1410 Cocaine abuse, uncomplicated: Secondary | ICD-10-CM

## 2020-01-03 DIAGNOSIS — I5043 Acute on chronic combined systolic (congestive) and diastolic (congestive) heart failure: Secondary | ICD-10-CM

## 2020-01-03 DIAGNOSIS — R29898 Other symptoms and signs involving the musculoskeletal system: Secondary | ICD-10-CM

## 2020-01-03 LAB — TSH: TSH: 1.235 u[IU]/mL (ref 0.350–4.500)

## 2020-01-03 LAB — GLUCOSE, CAPILLARY: Glucose-Capillary: 92 mg/dL (ref 70–99)

## 2020-01-03 LAB — RAPID URINE DRUG SCREEN, HOSP PERFORMED
Amphetamines: NOT DETECTED
Barbiturates: NOT DETECTED
Benzodiazepines: POSITIVE — AB
Cocaine: POSITIVE — AB
Opiates: POSITIVE — AB
Tetrahydrocannabinol: NOT DETECTED

## 2020-01-03 LAB — CK: Total CK: 106 U/L (ref 49–397)

## 2020-01-03 LAB — HEMOGLOBIN A1C
Hgb A1c MFr Bld: 5.2 % (ref 4.8–5.6)
Mean Plasma Glucose: 103 mg/dL

## 2020-01-03 LAB — SAR COV2 SEROLOGY (COVID19)AB(IGG),IA: SARS-CoV-2 Ab, IgG: REACTIVE — AB

## 2020-01-03 LAB — MRSA PCR SCREENING: MRSA by PCR: NEGATIVE

## 2020-01-03 LAB — TROPONIN I (HIGH SENSITIVITY): Troponin I (High Sensitivity): 585 ng/L

## 2020-01-03 MED ORDER — CHLORHEXIDINE GLUCONATE CLOTH 2 % EX PADS
6.0000 | MEDICATED_PAD | Freq: Every day | CUTANEOUS | Status: DC
  Administered 2020-01-04 – 2020-01-06 (×3): 6 via TOPICAL

## 2020-01-03 MED ORDER — AMIODARONE HCL 200 MG PO TABS
200.0000 mg | ORAL_TABLET | Freq: Two times a day (BID) | ORAL | Status: DC
  Administered 2020-01-03 – 2020-01-06 (×7): 200 mg via ORAL
  Filled 2020-01-03 (×7): qty 1

## 2020-01-03 MED ORDER — ASPIRIN EC 81 MG PO TBEC
81.0000 mg | DELAYED_RELEASE_TABLET | Freq: Every day | ORAL | Status: DC
  Administered 2020-01-03 – 2020-01-06 (×4): 81 mg via ORAL
  Filled 2020-01-03 (×4): qty 1

## 2020-01-03 MED ORDER — OXYCODONE HCL ER 10 MG PO T12A
20.0000 mg | EXTENDED_RELEASE_TABLET | Freq: Two times a day (BID) | ORAL | Status: DC | PRN
  Administered 2020-01-03 – 2020-01-06 (×6): 20 mg via ORAL
  Filled 2020-01-03 (×6): qty 2

## 2020-01-03 MED ORDER — PANTOPRAZOLE SODIUM 40 MG PO TBEC
40.0000 mg | DELAYED_RELEASE_TABLET | Freq: Every day | ORAL | Status: DC
  Administered 2020-01-03 – 2020-01-05 (×3): 40 mg via ORAL
  Filled 2020-01-03 (×3): qty 1

## 2020-01-03 MED ORDER — ATORVASTATIN CALCIUM 40 MG PO TABS
40.0000 mg | ORAL_TABLET | Freq: Every day | ORAL | Status: DC
  Administered 2020-01-03 – 2020-01-06 (×4): 40 mg via ORAL
  Filled 2020-01-03 (×4): qty 1

## 2020-01-03 MED ORDER — CARVEDILOL 3.125 MG PO TABS
3.1250 mg | ORAL_TABLET | Freq: Two times a day (BID) | ORAL | Status: DC
  Administered 2020-01-03 – 2020-01-06 (×7): 3.125 mg via ORAL
  Filled 2020-01-03 (×8): qty 1

## 2020-01-03 MED FILL — Medication: Qty: 1 | Status: AC

## 2020-01-03 NOTE — Progress Notes (Signed)
Confirmed with Dr. Baxter Flattery that patient OK to come to non-covid unit due to reactive IgG and negative nasal swab.

## 2020-01-03 NOTE — Progress Notes (Signed)
PT Cancellation Note  Patient Details Name: Ato Cronen MRN: [id number] DOB: April 10, 1951   Cancelled Treatment:    Reason Eval/Treat Not Completed: Active bedrest order.  NP still hasn't release the bedrest order until pt seen in rounds. 01/03/2020  Ginger Carne., PT Acute Rehabilitation Services [phone]  (pager) [phone]  (office)   Tessie Fass Kriya Westra 01/03/2020, 10:06 AM

## 2020-01-03 NOTE — Progress Notes (Signed)
Progress Note  Patient Name: Travis Salinas Date of Encounter: 01/03/2020  Primary Cardiologist: Marlou Porch   Subjective   69 year old gentleman with a history of longstanding cocaine abuse. He was admitted with neurologic changes consistent with a stroke.  He received TPA.  He had a wide Complex tachycardia.  He was cardioverted twice but without any change in his rhythm.  It was thought that he probably had sinus tachycardia with a underlying bundle branch block.  He remains in sinus rhythm today with frequent premature ventricular contractions as well as premature atrial contractions.  Currently on IV amiodarone. Troponin levels are mildly elevated with a gradually decreasing trend.  This could be due to his cardioversion attempts from yesterday.  Total CPK is 106.  B natruretic peptide is 60.1 which is negative.  Echocardiogram from yesterday reveals moderately depressed left ventricular systolic function with an ejection fraction of 35 to 40%.  There is grade 1 diastolic dysfunction.  He has a Takotsubo-like appearance (stress-induced cardiomyopathy) . He has been started on carvedilol 3.125 mg twice a day  I stressed the importance that he stop using cocaine.  He has been using for quite a long time but says that has not not been frequently.   Inpatient Medications    Scheduled Meds: .  stroke: mapping our early stages of recovery book   Does not apply Once  . amantadine  50 mg Oral BID  . amiodarone  150 mg Intravenous STAT  . ARIPiprazole  10 mg Oral Daily  . atorvastatin  40 mg Oral Daily  . benzonatate  200 mg Oral TID  . carvedilol  3.125 mg Oral BID WC  . Chlorhexidine Gluconate Cloth  6 each Topical Daily  . clonazePAM  1 mg Oral BID  . FLUoxetine  10 mg Oral Daily  . ipratropium-albuterol  3 mL Nebulization TID  . lamoTRIgine  200 mg Oral q morning - 10a  . oxyCODONE  20 mg Oral Q12H  . pantoprazole  40 mg Oral QHS  . sodium chloride flush  10-40 mL  Intracatheter Q12H  . sodium chloride flush  3 mL Intravenous Once  . tamsulosin  0.4 mg Oral Daily   Continuous Infusions: . sodium chloride 75 mL/hr at 01/02/20 1007  . amiodarone 30 mg/hr (01/03/20 0302)  . clevidipine Stopped (01/01/20 2305)   PRN Meds: acetaminophen **OR** acetaminophen (TYLENOL) oral liquid 160 mg/5 mL **OR** acetaminophen, albuterol, senna-docusate, sodium chloride flush   Vital Signs    Vitals:   01/03/20 0545 01/03/20 0626 01/03/20 0700 01/03/20 0943  BP: 99/72   124/77  Pulse: 76  73 79  Resp: 14  16   Temp:  98.1 F (36.7 C)    TempSrc:  Oral    SpO2: 100%     Weight:      Height:        Intake/Output Summary (Last 24 hours) at 01/03/2020 1019 Last data filed at 01/02/2020 1400 Gross per 24 hour  Intake --  Output 400 ml  Net -400 ml   Last 3 Weights 01/01/2020 10/21/2019 05/27/2019  Weight (lbs) 150 lb 140 lb 3.2 oz 145 lb 6.4 oz  Weight (kg) 68.04 kg 63.594 kg 65.953 kg      Telemetry    NSR with BBB.   Frequent PVCs and pacs  - Personally Reviewed  ECG      - Personally Reviewed  Physical Exam   GEN:  Middle-aged gentleman, appears older than stated age. Neck:  No JVD Cardiac: RRR, no murmurs, rubs, or gallops.  Respiratory: Clear to auscultation bilaterally. GI: Soft, nontender, non-distended  MS: No edema; No deformity. Neuro:  Nonfocal  Psych: Normal affect   Labs    High Sensitivity Troponin:   Recent Labs  Lab 01/02/20 1934 01/02/20 2339  TROPONINIHS 639* 585*      Chemistry Recent Labs  Lab 01/01/20 2210 01/01/20 2232 01/02/20 1934  NA 137 139 140  K 4.1 3.8 3.6  CL 101 105 107  CO2 20*  --  20*  GLUCOSE 99 94 124*  BUN 41* 42* 29*  CREATININE 1.44* 1.50* 1.31*  CALCIUM 10.3  --  9.1  PROT 7.3  --   --   ALBUMIN 4.4  --   --   AST 58*  --   --   ALT 36  --   --   ALKPHOS 106  --   --   BILITOT 1.3*  --   --   GFRNONAA 50*  --  56*  GFRAA 58*  --  >60  ANIONGAP 16*  --  13     Hematology Recent  Labs  Lab 01/01/20 2210 01/01/20 2232 01/02/20 1934  WBC 14.8*  --  13.5*  RBC 6.21*  --  5.13  HGB 16.7 18.4* 13.8  HCT 54.5* 54.0* 44.4  MCV 87.8  --  86.5  MCH 26.9  --  26.9  MCHC 30.6  --  31.1  RDW 13.4  --  13.2  PLT 284  --  284    BNPNo results for input(s): BNP, PROBNP in the last 168 hours.   DDimer No results for input(s): DDIMER in the last 168 hours.   Radiology    CT HEAD WO CONTRAST  Result Date: 01/03/2020 CLINICAL DATA:  Stroke, post tPA EXAM: CT HEAD WITHOUT CONTRAST TECHNIQUE: Contiguous axial images were obtained from the base of the skull through the vertex without intravenous contrast. Sagittal and coronal MPR images reconstructed from axial data set. COMPARISON:  01/01/2020 FINDINGS: Brain: Generalized atrophy. Normal ventricular morphology. No midline shift or mass effect. Small vessel chronic ischemic changes of deep cerebral white matter. No intracranial hemorrhage, mass lesion, evidence of acute infarction, or extra-axial fluid collection. Vascular: Atherosclerotic calcifications of internal carotid and vertebral arteries at skull base. No hyperdense vessels. Skull: Intact Sinuses/Orbits: Large mucosal retention cyst of the LEFT maxillary sinus unchanged. Remaining paranasal sinuses and mastoid air cells clear Other: N/A IMPRESSION: Atrophy with small vessel chronic ischemic changes of deep cerebral white matter. No acute intracranial abnormalities. Electronically Signed   By: Lavonia Dana M.D.   On: 01/03/2020 08:56   CT Cervical Spine Wo Contrast  Result Date: 01/01/2020 CLINICAL DATA:  Status post trauma. EXAM: CT CERVICAL SPINE WITHOUT CONTRAST TECHNIQUE: Multidetector CT imaging of the cervical spine was performed without intravenous contrast. Multiplanar CT image reconstructions were also generated. COMPARISON:  None. FINDINGS: Alignment: Normal. Skull base and vertebrae: No acute fracture. No primary bone lesion or focal pathologic process. Soft tissues  and spinal canal: No prevertebral fluid or swelling. No visible canal hematoma. Disc levels: Moderate to marked severity endplate sclerosis is seen at the levels of C5-C6 and C6-C7 with mild endplate sclerosis seen throughout the remainder of the cervical spine. Moderate to marked severity intervertebral disc space narrowing is seen at the levels of C5-C6 and C6-C7. Marked severity bilateral multilevel facet joint hypertrophy is seen. Upper chest: Moderate severity emphysematous lung disease is seen involving the bilateral upper  lobes. Other: None. IMPRESSION: 1. No acute osseous abnormality of the cervical spine. 2. Moderate to marked severity multilevel degenerative changes, most prominent at the levels of C5-C6 and C6-C7. 3. Moderate severity emphysematous lung disease involving the bilateral upper lobes. Emphysema (ICD10-J43.9). Electronically Signed   By: Virgina Norfolk M.D.   On: 01/01/2020 21:51   ECHOCARDIOGRAM COMPLETE  Result Date: 01/02/2020    ECHOCARDIOGRAM REPORT   Patient Name:   Travis Salinas Date of Exam: 01/02/2020 Medical Rec #:  [id number]          Height:       71.0 in Accession #:    [id number]         Weight:       150.0 lb Date of Birth:  01-23-1951          BSA:          1.866 m Patient Age:    2 years           BP:           112/91 mmHg Patient Gender: M                  HR:           81 bpm. Exam Location:  Inpatient Procedure: 2D Echo, Color Doppler and Cardiac Doppler                                MODIFIED REPORT:   This report was modified by Candee Furbish MD on 01/02/2020 due to [facility] comment                                     added.  Indications:     Stroke 434.91/II63.9  History:         Patient has prior history of Echocardiogram examinations, most                  recent 05/31/2021.  Sonographer:     Merrie Roof RDCS Referring Phys:  [id number] ERIC LINDZEN Diagnosing Phys: Candee Furbish MD IMPRESSIONS  1. Left ventricular ejection fraction, by estimation, is 35 to 40%. The left  ventricle has moderately decreased function. The left ventricle demonstrates regional wall motion abnormalities (see scoring diagram/findings for description). There is moderate left ventricular hypertrophy. Left ventricular diastolic parameters are consistent with Grade I diastolic dysfunction (impaired relaxation). Has Takotsubo like appearance (stress induced cardiomyopathy).  2. Right ventricular systolic function is normal. The right ventricular size is normal.  3. The mitral valve is normal in structure. Trivial mitral valve regurgitation. No evidence of mitral stenosis.  4. The aortic valve is normal in structure. Aortic valve regurgitation is not visualized. No aortic stenosis is present.  5. The inferior vena cava is normal in size with greater than 50% respiratory variability, suggesting right atrial pressure of 3 mmHg. Comparison(s): The left ventricular function is worsened. LV function appeared normal in 2018. Conclusion(s)/Recommendation(s): No intracardiac source of embolism detected on this transthoracic study. A transesophageal echocardiogram is recommended to exclude cardiac source of embolism if clinically indicated. FINDINGS  Left Ventricle: Left ventricular ejection fraction, by estimation, is 35 to 40%. The left ventricle has moderately decreased function. The left ventricle demonstrates regional wall motion abnormalities. The left ventricular internal cavity size was normal in size. There is moderate left ventricular hypertrophy.  Left ventricular diastolic parameters are consistent with Grade I diastolic dysfunction (impaired relaxation).  LV Wall Scoring: The entire apex is akinetic. Has Takotsubo like appearance (stress induced cardiomyopathy). Right Ventricle: The right ventricular size is normal. No increase in right ventricular wall thickness. Right ventricular systolic function is normal. Left Atrium: Left atrial size was normal in size. Right Atrium: Right atrial size was normal in  size. Pericardium: There is no evidence of pericardial effusion. Mitral Valve: The mitral valve is normal in structure. Normal mobility of the mitral valve leaflets. Trivial mitral valve regurgitation. No evidence of mitral valve stenosis. Tricuspid Valve: The tricuspid valve is normal in structure. Tricuspid valve regurgitation is not demonstrated. No evidence of tricuspid stenosis. Aortic Valve: The aortic valve is normal in structure. Aortic valve regurgitation is not visualized. No aortic stenosis is present. Pulmonic Valve: The pulmonic valve was normal in structure. Pulmonic valve regurgitation is mild. No evidence of pulmonic stenosis. Aorta: The aortic root is normal in size and structure. Venous: The inferior vena cava is normal in size with greater than 50% respiratory variability, suggesting right atrial pressure of 3 mmHg. IAS/Shunts: No atrial level shunt detected by color flow Doppler.  LEFT VENTRICLE PLAX 2D LVIDd:         4.60 cm     Diastology LVIDs:         3.00 cm     LV e' lateral:   5.55 cm/s LV PW:         1.50 cm     LV E/e' lateral: 9.6 LV IVS:        1.30 cm     LV e' medial:    6.64 cm/s LVOT diam:     2.10 cm     LV E/e' medial:  8.0 LV SV:         39 LV SV Index:   21 LVOT Area:     3.46 cm  LV Volumes (MOD) LV vol d, MOD A2C: 69.3 ml LV vol d, MOD A4C: 76.4 ml LV vol s, MOD A2C: 46.7 ml LV vol s, MOD A4C: 52.7 ml LV SV MOD A2C:     22.6 ml LV SV MOD A4C:     76.4 ml LV SV MOD BP:      23.3 ml RIGHT VENTRICLE             IVC RV Basal diam:  3.10 cm     IVC diam: 1.50 cm RV S prime:     11.90 cm/s TAPSE (M-mode): 2.0 cm LEFT ATRIUM             Index       RIGHT ATRIUM           Index LA diam:        2.70 cm 1.45 cm/m  RA Area:     13.50 cm LA Vol (A2C):   43.4 ml 23.26 ml/m RA Volume:   30.40 ml  16.29 ml/m LA Vol (A4C):   34.8 ml 18.65 ml/m LA Biplane Vol: 40.8 ml 21.87 ml/m  AORTIC VALVE LVOT Vmax:   62.00 cm/s LVOT Vmean:  44.500 cm/s LVOT VTI:    0.112 m  AORTA Ao Root diam:  3.70 cm Ao Asc diam:  3.30 cm MITRAL VALVE MV Area (PHT): 3.54 cm    SHUNTS MV Decel Time: 214 msec    Systemic VTI:  0.11 m MV E velocity: 53.10 cm/s  Systemic Diam: 2.10 cm MV A velocity: 63.80  cm/s MV E/A ratio:  0.83 Candee Furbish MD Electronically signed by Candee Furbish MD Signature Date/Time: 01/02/2020/3:35:22 PM    Final (Updated)    CT HEAD CODE STROKE WO CONTRAST  Result Date: 01/01/2020 CLINICAL DATA:  Code stroke.  Left-sided weakness. EXAM: CT HEAD WITHOUT CONTRAST TECHNIQUE: Contiguous axial images were obtained from the base of the skull through the vertex without intravenous contrast. COMPARISON:  MRI 07/12/2013 FINDINGS: Brain: Mild generalized atrophy. Chronic small-vessel ischemic changes of the white matter. No sign of acute infarction, mass lesion, hemorrhage, hydrocephalus or extra-axial collection. Vascular: There is atherosclerotic calcification of the major vessels at the base of the brain. Skull: Normal Sinuses/Orbits: Clear/normal. Insignificant retention cyst left maxillary sinus. Other: None ASPECTS ([place] Stroke Program Early CT Score) - Ganglionic level infarction (caudate, lentiform nuclei, internal capsule, insula, M1-M3 cortex): 7 - Supraganglionic infarction (M4-M6 cortex): 3 Total score (0-10 with 10 being normal): 10 IMPRESSION: 1. No acute head CT finding. Chronic small-vessel changes of the white matter. 2. ASPECTS is 10 3. These results were communicated to Dr. Cheral Marker at 9:36 pmon 4/24/2021by text page via the [facility] messaging system. Electronically Signed   By: Nelson Chimes M.D.   On: 01/01/2020 21:37   CT Maxillofacial Wo Contrast  Result Date: 01/01/2020 CLINICAL DATA:  Status post trauma. EXAM: CT HEAD WITHOUT CONTRAST CT MAXILLOFACIAL WITHOUT CONTRAST TECHNIQUE: Multidetector CT imaging of the head and maxillofacial structures were performed using the standard protocol without intravenous contrast. Multiplanar CT image reconstructions of the maxillofacial  structures were also generated. COMPARISON:  MRI head July 12, 2013. FINDINGS: CT HEAD FINDINGS Brain: There is mild cerebral atrophy with widening of the extra-axial spaces and ventricular dilatation. There are areas of decreased attenuation within the white matter tracts of the supratentorial brain, consistent with microvascular disease changes. Vascular: No hyperdense vessel or unexpected calcification. Skull: Normal. Negative for fracture or focal lesion. Other: There is marked severity left maxillary sinus mucosal thickening. CT MAXILLOFACIAL FINDINGS Osseous: No fracture or mandibular dislocation. No destructive process. Orbits: Negative. No traumatic or inflammatory finding. Sinuses: There is marked severity left maxillary sinus mucosal thickening. Soft tissues: Negative. IMPRESSION: 1. No acute intracranial abnormality. 2. Mild cerebral atrophy and microvascular disease changes of the supratentorial brain. 3. Marked severity left maxillary sinus mucosal thickening. Electronically Signed   By: Virgina Norfolk M.D.   On: 01/01/2020 21:48    Cardiac Studies     Patient Profile     69 y.o. male   Assessment & Plan    1.  Acute on chronic combined systolic and diastolic congestive heart failure: He has moderately reduced left ventricular systolic function with an ejection fraction of 35 to 40%.  This is likely due to cocaine use.  There is a Takotsubo-like pattern to his LV contractility  consistent with diffuse coronary spasm. Echocardiogram from September 2018 revealed normal left ventricular systolic function.  Continue carvedilol.  Would add ARB as blood pressure tolerates.  He was hypotensive last night.  Blood pressure seems to be improving.  2.  Cocaine use: I have strongly advised him to stop using cocaine.  He says that he is committed to stopping.  3.  Wide-complex tachycardia: This appears to be due to sinus rhythm with underlying bundle branch block.  Currently he is in  sinus rhythm with frequent premature ventricular contractions. He is on IV amiodarone.  We can transition to oral amiodarone and continue to follow. We will follow up with Dr. Marlou Porch in the office.  4.  Mildly elevated troponin levels: This is consistent with demand ischemia and is likely consistent with this Takotsubo syndrome.  I doubt that he has an acute coronary syndrome.  We will continue to follow.  At this point he does not need a heart catheterization.     For questions or updates, please contact [facility] Please consult [email or web address] for contact info under        Signed, Mertie Moores, MD  01/03/2020, 10:19 AM

## 2020-01-03 NOTE — Social Work (Signed)
CSW met with pt at bedside. CSW introduced self and explained her role at the hospital. CSW completed sbirt with the pt. Pt scored a 3 on the sbirt scale. Pt stated he usually only drinks a couple times on year when he goes on vacation. Pt stated the last time he had alcohol was about 2 weeks ago when he was in [place] visiting his brothers. PT stated he does not have an issue with alcohol use. CSW inquired about substance use. Pt states he uses heroin and cocaine about twice a month. Pt states that he does not have a problem with alcohol or substance use. Pt states that he can stop at any pooint and that he does it because he wants to and not because he needs to.   CSW offered resources and pt declined resources.   Emeterio Reeve, Latanya Presser, [facility] [place] Social Worker [phone]

## 2020-01-03 NOTE — Progress Notes (Signed)
STROKE TEAM PROGRESS NOTE   INTERVAL HISTORY Patient is sitting up in bed.  He states his left-sided weakness is improving.  Blood pressure adequately controlled.  Repeat CT head today shows mild atrophy and small vessel disease changes.  MRI scan shows by cerebral small punctate embolic infarcts involving bilateral ACA, left MCA and bilateral cerebellar hemispheres.  No post TPA hemorrhage is noted.  Patient had episode of wide-complex tachycardia requiring cardioversion which was unsuccessful and was started on IV amiodarone and oral carvedilol.  Cardiology is following OBJECTIVE Vitals:   01/03/20 1000 01/03/20 1100 01/03/20 1137 01/03/20 1200  BP: 106/64 110/73  105/71  Pulse: 79 (!) 37  77  Resp: 16 13  13   Temp:   98.2 F (36.8 C)   TempSrc:   Oral   SpO2: 99% 96%  97%  Weight:      Height:        CBC:  Recent Labs  Lab 01/01/20 2210 01/01/20 2210 01/01/20 2232 01/02/20 1934  WBC 14.8*  --   --  13.5*  NEUTROABS 9.5*  --   --   --   HGB 16.7   < > 18.4* 13.8  HCT 54.5*   < > 54.0* 44.4  MCV 87.8  --   --  86.5  PLT 284  --   --  284   < > = values in this interval not displayed.    Basic Metabolic Panel:  Recent Labs  Lab 01/01/20 2210 01/01/20 2210 01/01/20 2232 01/02/20 1934  NA 137   < > 139 140  K 4.1   < > 3.8 3.6  CL 101   < > 105 107  CO2 20*  --   --  20*  GLUCOSE 99   < > 94 124*  BUN 41*   < > 42* 29*  CREATININE 1.44*   < > 1.50* 1.31*  CALCIUM 10.3  --   --  9.1  MG  --   --   --  1.9   < > = values in this interval not displayed.    Lipid Panel:     Component Value Date/Time   CHOL 151 01/02/2020 0329   TRIG 97 01/02/2020 0329   HDL 45 01/02/2020 0329   CHOLHDL 3.4 01/02/2020 0329   VLDL 19 01/02/2020 0329   LDLCALC 87 01/02/2020 0329   HgbA1c:  Lab Results  Component Value Date   HGBA1C 5.2 01/02/2020   Urine Drug Screen:     Component Value Date/Time   LABOPIA POSITIVE (A) 01/02/2020 0544   COCAINSCRNUR POSITIVE (A)  01/02/2020 0544   LABBENZ POSITIVE (A) 01/02/2020 0544   AMPHETMU NONE DETECTED 01/02/2020 0544   THCU NONE DETECTED 01/02/2020 0544   LABBARB NONE DETECTED 01/02/2020 0544    Alcohol Level     Component Value Date/Time   [facility]  05/10/2010 1230    <5        LOWEST DETECTABLE LIMIT FOR SERUM ALCOHOL IS 5 mg/dL FOR MEDICAL PURPOSES ONLY    IMAGING past 24h CT HEAD WO CONTRAST  Result Date: 01/03/2020 CLINICAL DATA:  Stroke, post tPA EXAM: CT HEAD WITHOUT CONTRAST TECHNIQUE: Contiguous axial images were obtained from the base of the skull through the vertex without intravenous contrast. Sagittal and coronal MPR images reconstructed from axial data set. COMPARISON:  01/01/2020 FINDINGS: Brain: Generalized atrophy. Normal ventricular morphology. No midline shift or mass effect. Small vessel chronic ischemic changes of deep cerebral white matter. No intracranial  hemorrhage, mass lesion, evidence of acute infarction, or extra-axial fluid collection. Vascular: Atherosclerotic calcifications of internal carotid and vertebral arteries at skull base. No hyperdense vessels. Skull: Intact Sinuses/Orbits: Large mucosal retention cyst of the LEFT maxillary sinus unchanged. Remaining paranasal sinuses and mastoid air cells clear Other: N/A IMPRESSION: Atrophy with small vessel chronic ischemic changes of deep cerebral white matter. No acute intracranial abnormalities. Electronically Signed   By: Lavonia Dana M.D.   On: 01/03/2020 08:56   ECHOCARDIOGRAM COMPLETE  Result Date: 01/02/2020    ECHOCARDIOGRAM REPORT   Patient Name:   Travis Salinas Date of Exam: 01/02/2020 Medical Rec #:  [id number]          Height:       71.0 in Accession #:    [id number]         Weight:       150.0 lb Date of Birth:  1950-11-20          BSA:          1.866 m Patient Age:    73 years           BP:           112/91 mmHg Patient Gender: M                  HR:           81 bpm. Exam Location:  Inpatient Procedure: 2D Echo, Color  Doppler and Cardiac Doppler                                MODIFIED REPORT:   This report was modified by Candee Furbish MD on 01/02/2020 due to [facility] comment                                     added.  Indications:     Stroke 434.91/II63.9  History:         Patient has prior history of Echocardiogram examinations, most                  recent 05/31/2021.  Sonographer:     Merrie Roof RDCS Referring Phys:  [id number] ERIC LINDZEN Diagnosing Phys: Candee Furbish MD IMPRESSIONS  1. Left ventricular ejection fraction, by estimation, is 35 to 40%. The left ventricle has moderately decreased function. The left ventricle demonstrates regional wall motion abnormalities (see scoring diagram/findings for description). There is moderate left ventricular hypertrophy. Left ventricular diastolic parameters are consistent with Grade I diastolic dysfunction (impaired relaxation). Has Takotsubo like appearance (stress induced cardiomyopathy).  2. Right ventricular systolic function is normal. The right ventricular size is normal.  3. The mitral valve is normal in structure. Trivial mitral valve regurgitation. No evidence of mitral stenosis.  4. The aortic valve is normal in structure. Aortic valve regurgitation is not visualized. No aortic stenosis is present.  5. The inferior vena cava is normal in size with greater than 50% respiratory variability, suggesting right atrial pressure of 3 mmHg. Comparison(s): The left ventricular function is worsened. LV function appeared normal in 2018. Conclusion(s)/Recommendation(s): No intracardiac source of embolism detected on this transthoracic study. A transesophageal echocardiogram is recommended to exclude cardiac source of embolism if clinically indicated. FINDINGS  Left Ventricle: Left ventricular ejection fraction, by estimation, is 35 to 40%. The left ventricle has moderately decreased function. The  left ventricle demonstrates regional wall motion abnormalities. The left ventricular internal cavity  size was normal in size. There is moderate left ventricular hypertrophy. Left ventricular diastolic parameters are consistent with Grade I diastolic dysfunction (impaired relaxation).  LV Wall Scoring: The entire apex is akinetic. Has Takotsubo like appearance (stress induced cardiomyopathy). Right Ventricle: The right ventricular size is normal. No increase in right ventricular wall thickness. Right ventricular systolic function is normal. Left Atrium: Left atrial size was normal in size. Right Atrium: Right atrial size was normal in size. Pericardium: There is no evidence of pericardial effusion. Mitral Valve: The mitral valve is normal in structure. Normal mobility of the mitral valve leaflets. Trivial mitral valve regurgitation. No evidence of mitral valve stenosis. Tricuspid Valve: The tricuspid valve is normal in structure. Tricuspid valve regurgitation is not demonstrated. No evidence of tricuspid stenosis. Aortic Valve: The aortic valve is normal in structure. Aortic valve regurgitation is not visualized. No aortic stenosis is present. Pulmonic Valve: The pulmonic valve was normal in structure. Pulmonic valve regurgitation is mild. No evidence of pulmonic stenosis. Aorta: The aortic root is normal in size and structure. Venous: The inferior vena cava is normal in size with greater than 50% respiratory variability, suggesting right atrial pressure of 3 mmHg. IAS/Shunts: No atrial level shunt detected by color flow Doppler.  LEFT VENTRICLE PLAX 2D LVIDd:         4.60 cm     Diastology LVIDs:         3.00 cm     LV e' lateral:   5.55 cm/s LV PW:         1.50 cm     LV E/e' lateral: 9.6 LV IVS:        1.30 cm     LV e' medial:    6.64 cm/s LVOT diam:     2.10 cm     LV E/e' medial:  8.0 LV SV:         39 LV SV Index:   21 LVOT Area:     3.46 cm  LV Volumes (MOD) LV vol d, MOD A2C: 69.3 ml LV vol d, MOD A4C: 76.4 ml LV vol s, MOD A2C: 46.7 ml LV vol s, MOD A4C: 52.7 ml LV SV MOD A2C:     22.6 ml LV SV MOD A4C:      76.4 ml LV SV MOD BP:      23.3 ml RIGHT VENTRICLE             IVC RV Basal diam:  3.10 cm     IVC diam: 1.50 cm RV S prime:     11.90 cm/s TAPSE (M-mode): 2.0 cm LEFT ATRIUM             Index       RIGHT ATRIUM           Index LA diam:        2.70 cm 1.45 cm/m  RA Area:     13.50 cm LA Vol (A2C):   43.4 ml 23.26 ml/m RA Volume:   30.40 ml  16.29 ml/m LA Vol (A4C):   34.8 ml 18.65 ml/m LA Biplane Vol: 40.8 ml 21.87 ml/m  AORTIC VALVE LVOT Vmax:   62.00 cm/s LVOT Vmean:  44.500 cm/s LVOT VTI:    0.112 m  AORTA Ao Root diam: 3.70 cm Ao Asc diam:  3.30 cm MITRAL VALVE MV Area (PHT): 3.54 cm    SHUNTS MV Decel Time: 214  msec    Systemic VTI:  0.11 m MV E velocity: 53.10 cm/s  Systemic Diam: 2.10 cm MV A velocity: 63.80 cm/s MV E/A ratio:  0.83 Candee Furbish MD Electronically signed by Candee Furbish MD Signature Date/Time: 01/02/2020/3:35:22 PM    Final (Updated)    CAROTID (at [facility] and [facility] only)  Result Date: 01/03/2020 Carotid Arterial Duplex Study Indications:       Weakness and New onset of gait instability. Risk Factors:      Hypertension. Other Factors:     Cervical disc herniation. Limitations        Today's exam was limited due to movement, body habitus. Comparison Study:  No prior study on file Performing Technologist: Sharion Dove RVS  Examination Guidelines: A complete evaluation includes B-mode imaging, spectral Doppler, color Doppler, and power Doppler as needed of all accessible portions of each vessel. Bilateral testing is considered an integral part of a complete examination. Limited examinations for reoccurring indications may be performed as noted.  Right Carotid Findings: +----------+--------+--------+--------+------------------+------------------+           PSV cm/sEDV cm/sStenosisPlaque DescriptionComments           +----------+--------+--------+--------+------------------+------------------+ CCA Prox  61      8                                 intimal thickening  +----------+--------+--------+--------+------------------+------------------+ CCA Distal43      9                                 intimal thickening +----------+--------+--------+--------+------------------+------------------+ ICA Prox  50      13                                                   +----------+--------+--------+--------+------------------+------------------+ ICA Distal84      27                                                   +----------+--------+--------+--------+------------------+------------------+ ECA       119     17                                                   +----------+--------+--------+--------+------------------+------------------+ +----------+--------+-------+--------+-------------------+           PSV cm/sEDV cmsDescribeArm Pressure (mmHG) +----------+--------+-------+--------+-------------------+ [id number]                                         +----------+--------+-------+--------+-------------------+ +---------+--------+--------+--------------+ VertebralPSV cm/sEDV cm/sNot identified +---------+--------+--------+--------------+  Left Carotid Findings: +----------+--------+--------+--------+------------------+------------------+           PSV cm/sEDV cm/sStenosisPlaque DescriptionComments           +----------+--------+--------+--------+------------------+------------------+ CCA Prox  54      8  intimal thickening +----------+--------+--------+--------+------------------+------------------+ CCA Distal50      11                                intimal thickening +----------+--------+--------+--------+------------------+------------------+ ICA Prox  32      9                                                    +----------+--------+--------+--------+------------------+------------------+ ICA Distal45      8                                                     +----------+--------+--------+--------+------------------+------------------+ ECA       74      9                                                    +----------+--------+--------+--------+------------------+------------------+ +----------+--------+--------+--------+-------------------+           PSV cm/sEDV cm/sDescribeArm Pressure (mmHG) +----------+--------+--------+--------+-------------------+ [id number]                                         +----------+--------+--------+--------+-------------------+ +---------+--------+--+--------+-+ VertebralPSV cm/s28EDV cm/s8 +---------+--------+--+--------+-+   Summary: Right Carotid: The extracranial vessels were near-normal with only minimal wall                thickening or plaque. Left Carotid: The extracranial vessels were near-normal with only minimal wall               thickening or plaque. Vertebrals:  Left vertebral artery demonstrates antegrade flow. Bilateral              vertebral arteries were not visualized. Subclavians: Normal flow hemodynamics were seen in bilateral subclavian              arteries. *See table(s) above for measurements and observations.     Preliminary      PHYSICAL EXAM  Frail middle-age Caucasian male not in distress. NAD, poor grooming, appears older than stated age           . Afebrile. Head is nontraumatic. Neck is supple without bruit.    Cardiac exam no murmur or gallop. Lungs are clear to auscultation. Distal pulses are well felt.    Neurological exam :  Awake alert oriented to time place and person  speech:    Speech is normal; fluent and spontaneous with normal comprehension. Attention normal.  Cognition:    The patient is oriented to person, place, and time;     recent and remote memory intact;     language fluent;    Cranial Nerves:    The pupils are equal, round, and reactive to light.Trigeminal sensation is intact and the muscles of mastication are normal. The face is symmetric. The  palate elevates in the midline. Hearing intact. Voice is normal. Shoulder shrug is normal. The tongue has normal motion without fasciculations. Voice and hearing intact.  Coordination:  Ataxia in the lower extremities  Motor Observation:    No asymmetry, no atrophy, and no involuntary movements noted. Tone:    Normal muscle tone.     Strength:    Mild left hemiparesis 4/5 strength with weakness of left grip and intrinsic hand muscles.  Orbits right over left upper extremity.  Fine finger movements are diminished on the left compared to the right.  Normal strength on the right.    Sensation: intact to LT  Gait: Deferred   ASSESSMENT/PLAN [name] is a 69 y.o. male with history of Htn, hepatitis C, Cervical disc herniation, and COPD presenting with gait instability, several falls, left sided numbness and bilateral LE weakness with ataxia. The patient received IV t-PA Saturday 01/01/20 at 22:15. +Cocaine in UDS.   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 MRI pending  Code Stroke CT Head - No acute head CT finding. Chronic small-vessel changes of the white matter. ASPECTS is 10   CT CS no acute abnormality. Moderate to severe degenerative changes C5-6, C6-7. Moderate severe emphysema.   CT face L maxillary sinus dz  CT head post 24h no acute abnormality Small vessel disease. Atrophy.   MRI head -small punctate bicerebral embolic infarcts involving bilateral ACA, left MCA and bilateral cerebellum   MRA head -no large vessel stenosis or occlusion carotid Doppler - B ICA 1-39% stenosis, VAs antegrade    2D Echo - EF 35-40%. No source of embolus   [employer] Virus 2 - neg, serology positive  LDL - 87  HgbA1c - 5.9  UDS - + opiates, cocaine and benzos  HIV - pending  Troponin -elevated at 585 NG/mL   VTE prophylaxis - SCDs  No antithrombotic prior to admission, now on No antithrombotic. Add ASA 81 mg daily. Awaiting  MRI to decide on DAPT   Therapy recommendations:  pending  Disposition:  Pending  Ok to be OOB  Transfer to the f loor  Hypertension  Home BP meds: tenormin 25 bid  Current BP meds: coreg 3.125 bid  Stable . SBP goal now < 180 mm Hg  . Long-term BP goal normotensive  Hyperlipidemia  Home Lipid lowering medication: none   LDL 87, goal < 70  Now on lipitor 40  Continue statin at discharge  Other Stroke Risk Factors  Advanced age  Former cigarette smoker - quit  Family hx stroke (mother and father)  Substance Abuse - heroin and cocaine. UDS +benzos, opiates and cocaine. Pt advised to stop using d/t stroke risk. SW addressed as well and pt refused cessation resources.  Acute on Chronic systolic and diastolic CHF secondary to cocaine use, UDS+ on admission, EF 35-40%. Takotsubo-like pattern c/w coronary spasm. Continue coreg. Add ARB if BP tolerates. Cardiology on board.  Other Active Problems  Code status - Full code  COPD / Emphysema [id number])  Marked severity left maxillary sinus mucosal thickening  Moderate to marked severity multilevel degenerative changes, most prominent at the levels of C5-C6 and C6-C7  Leukocytosis - 14.8 (afebrile)  CKD stage 3a - creatinine - 1.44->1.50  Wide complex tachycardia d/t NSR w/ underlying BBB. Treated w/ IV amiodarone. Transitioned to oral. Cardiology on obard. F/u w/ Dr. Marlou Porch as OP.  Elevated troponin c/w demand ischemia from Takotsubo syndrome. Doubt acute coronary syndrome. Cath not needed. Cardiology on board.   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  Patient counseled to quit cocaine and make lifestyle changes and is willing to do so.  Continue aspirin and Plavix for now and management of cardiac arrhythmias and cardiomyopathy as per cardiology team.  Continue strict blood pressure control and close  neurological monitoring as per post TPA protocol.  Mobilize out of bed.  Therapy consults.  Will likely need rehab.  Discussed with patient and team and answered questions This patient is critically ill and at significant risk of neurological worsening, death and care requires constant monitoring of vital signs, hemodynamics,respiratory and cardiac monitoring, extensive review of multiple databases, frequent neurological assessment, discussion with family, other specialists and medical decision making of high complexity.I have made any additions or clarifications directly to the above note.This critical care time does not reflect procedure time, or teaching time or supervisory time of PA/NP/Med Resident etc but could involve care discussion time.  I spent 30 minutes of neurocritical care time  in the care of  this patient.    Antony Contras, MD  To contact Stroke Continuity provider, please refer to [email or web address] After hours, contact General Neurology

## 2020-01-03 NOTE — Evaluation (Signed)
Occupational Therapy Evaluation Patient Details Name: Travis Salinas MRN: [id number] DOB: [birth year] Today's Date: 01/03/2020    History of Present Illness Pt is a 69 year old man admitted with falls and L side weakness. Admininstered tPA. CT scan negative for acute changes, MRI results pending. Hospital course complicated by tachycardia. Cardioverted 4/25.  Pt PMH: COPD, CHF, arthritis, HepC, HTN, cocaine abuse.    Clinical Impression   Pt was independent prior to admission. Presents with L side weakness (LE>UE), impaired balance and decreases safety awareness and he fatigued. Pt requires set up to moderate assistance for ADL and min assist for OOB mobility. He was safer with RW. Pt needs short term, intensive rehab. Recommending CIR. Will follow acutely.    Follow Up Recommendations  CIR    Equipment Recommendations  Other (comment)(defer to next venue)    Recommendations for Other Services       Precautions / Restrictions Precautions Precautions: Fall      Mobility Bed Mobility Overal bed mobility: Needs Assistance Bed Mobility: Supine to Sit;Sit to Supine     Supine to sit: Min guard Sit to supine: Min guard   General bed mobility comments: Coming up to EOB no assist needed, but L UE came from behind and was used as an afterthough when pt's attention was focused to the L hand.  Pt repositioned himself into supine without assist other than feet blocking.  Transfers Overall transfer level: Needs assistance   Transfers: Sit to/from Stand Sit to Stand: Min assist         General transfer comment: stability assist    Balance Overall balance assessment: Needs assistance Sitting-balance support: Single extremity supported;Feet supported Sitting balance-Leahy Scale: Fair     Standing balance support: Single extremity supported;Bilateral upper extremity supported Standing balance-Leahy Scale: Fair(and ended up poor.) Standing balance comment: statically, poor  dynamic                           ADL either performed or assessed with clinical judgement   ADL Overall ADL's : Needs assistance/impaired Eating/Feeding: Minimal assistance;Bed level   Grooming: Set up;Sitting;Wash/dry hands   Upper Body Bathing: Minimal assistance;Sitting   Lower Body Bathing: Moderate assistance;Sit to/from stand   Upper Body Dressing : Minimal assistance;Sitting   Lower Body Dressing: Moderate assistance;Sit to/from stand   Toilet Transfer: Minimal assistance;Ambulation   Toileting- Clothing Manipulation and Hygiene: Minimal assistance;Sit to/from stand       Functional mobility during ADLs: Minimal assistance;+2 for safety/equipment General ADL Comments: pt fatigues with ambulation and becomes more unsafe     Vision Baseline Vision/History: Wears glasses Patient Visual Report: No change from baseline       Perception     Praxis      Pertinent Vitals/Pain Pain Assessment: No/denies pain     Hand Dominance Right   Extremity/Trunk Assessment Upper Extremity Assessment Upper Extremity Assessment: Defer to OT evaluation LUE Deficits / Details: strength is 4+/5 but fatigues easily   Lower Extremity Assessment Lower Extremity Assessment: LLE deficits/detail;RLE deficits/detail RLE Deficits / Details: WFL LLE Deficits / Details: functional strength, pt able to isolate movements.  quick to fatigue and lose coordination during gait. LLE Coordination: decreased fine motor       Communication Communication Communication: HOH   Cognition Arousal/Alertness: Awake/alert Behavior During Therapy: WFL for tasks assessed/performed Overall Cognitive Status: Within Functional Limits for tasks assessed Area of Impairment: Safety/judgement  Safety/Judgement: Decreased awareness of safety;Decreased awareness of deficits         General Comments  vss throughout.    Exercises     Shoulder Instructions       Home Living Family/patient expects to be discharged to:: Private residence Living Arrangements: Non-relatives/Friends(Travis) Available Help at Discharge: Friend(s);Available 24 hours/day Type of Home: Other(Comment)(condo) Home Access: Stairs to enter Entrance Stairs-Number of Steps: 3 flights Entrance Stairs-Rails: Right;Left Home Layout: One level     Bathroom Shower/Tub: Occupational psychologist: Standard     Home Equipment: Shower seat - built in          Prior Functioning/Environment Level of Independence: Independent                 OT Problem List: Decreased activity tolerance;Impaired balance (sitting and/or standing);Decreased knowledge of use of DME or AE;Decreased safety awareness;Decreased cognition      OT Treatment/Interventions: Self-care/ADL training;Neuromuscular education;DME and/or AE instruction;Therapeutic activities;Patient/family education;Balance training    OT Goals(Current goals can be found in the care plan section) Acute Rehab OT Goals Patient Stated Goal: get back going,  agreeable to goin to rehab if possible OT Goal Formulation: With patient Time For Goal Achievement: 01/17/20 Potential to Achieve Goals: Good ADL Goals Pt Will Perform Grooming: with supervision;standing Pt Will Perform Upper Body Dressing: sitting;with set-up Pt Will Perform Lower Body Dressing: with supervision;sit to/from stand Pt Will Transfer to Toilet: with supervision;ambulating;regular height toilet Pt Will Perform Toileting - Clothing Manipulation and hygiene: with supervision;sit to/from stand Additional ADL Goal #1: Pt will monitor himself for signs of fatigue and initiate rest break to prevent falls.  OT Frequency:     Barriers to D/C:            Co-evaluation PT/OT/SLP Co-Evaluation/Treatment: Yes Reason for Co-Treatment: For patient/therapist safety   OT goals addressed during session: ADL's and self-care      AM-PAC OT "6 Clicks"  Daily Activity     Outcome Measure Help from another person eating meals?: A Little Help from another person taking care of personal grooming?: A Little Help from another person toileting, which includes using toliet, bedpan, or urinal?: A Little Help from another person bathing (including washing, rinsing, drying)?: A Lot Help from another person to put on and taking off regular upper body clothing?: A Little Help from another person to put on and taking off regular lower body clothing?: A Lot 6 Click Score: 16   End of Session Nurse Communication: Mobility status  Activity Tolerance: Patient tolerated treatment well Patient left: in bed;with call bell/phone within reach;with family/visitor present  OT Visit Diagnosis: Unsteadiness on feet (R26.81);Other abnormalities of gait and mobility (R26.89);Muscle weakness (generalized) (M62.81);Hemiplegia and hemiparesis;Other symptoms and signs involving cognitive function Hemiplegia - Right/Left: Left Hemiplegia - dominant/non-dominant: Non-Dominant Hemiplegia - caused by: Cerebral infarction                Time: [id number] OT Time Calculation (min): 23 min Charges:  OT General Charges $OT Visit: 1 Visit OT Evaluation $OT Eval Moderate Complexity: 1 Mod  Nestor Lewandowsky, OTR/L Acute Rehabilitation Services Pager: [phone] Office: [phone]  Malka So 01/03/2020, 2:39 PM

## 2020-01-03 NOTE — Progress Notes (Signed)
OT Cancellation Note  Patient Details Name: Travis Salinas MRN: [id number] DOB: [birth year]   Cancelled Treatment:    Reason Eval/Treat Not Completed: Active bedrest order  Malka So 01/03/2020, 8:06 AM  Nestor Lewandowsky, OTR/L Acute Rehabilitation Services Pager: [phone] Office: [phone]

## 2020-01-03 NOTE — Progress Notes (Signed)
Physical Therapy Evaluation Patient Details Name: Travis Salinas MRN: [id number] DOB: May 27, 1951 Today's Date: 01/03/2020   History of Present Illness  Pt is a 69 year old man admitted with falls and L side weakness. Admininstered tPA. CT scan negative for acute changes, MRI results pending. Hospital course complicated by tachycardia. Cardioverted 4/25.  Pt PMH: COPD, CHF, arthritis, HepC, HTN, cocaine abuse.   Clinical Impression  Pt admitted with/for falls and L sided weakness.  MRI pending.  Pt needing min assist overall with most mobility with mod assist need if gait degrades with longer distances.  Pt currently limited functionally due to the problems listed. ( See problems list.)   Pt will benefit from PT to maximize function and safety in order to get ready for next venue listed below.     Follow Up Recommendations CIR;Supervision/Assistance - 24 hour    Equipment Recommendations  None recommended by PT;Other (comment)(TBA)    Recommendations for Other Services Rehab consult     Precautions / Restrictions Precautions Precautions: Fall      Mobility  Bed Mobility Overal bed mobility: Needs Assistance Bed Mobility: Supine to Sit;Sit to Supine     Supine to sit: Min guard Sit to supine: Min guard   General bed mobility comments: Coming up to EOB no assist needed, but L UE came from behind and was used as an afterthough when pt's attention was focused to the L hand.  Pt repositioned himself into supine without assist other than feet blocking.  Transfers Overall transfer level: Needs assistance   Transfers: Sit to/from Stand Sit to Stand: Min assist         General transfer comment: stability assist  Ambulation/Gait Ambulation/Gait assistance: Min assist;+2 physical assistance;+2 safety/equipment;Mod assist Gait Distance (Feet): 170 Feet Assistive device: IV Pole;1 person hand held assist;Rolling walker (2 wheeled) Gait Pattern/deviations: Step-through  pattern;Decreased step length - right;Decreased step length - left;Decreased stance time - left;Decreased stride length Gait velocity: slower Gait velocity interpretation: <1.8 ft/sec, indicate of risk for recurrent falls General Gait Details: pt's gait was mildly unsteady overall and degraded with longer distance.  gait was paretic with weak df clearance then degressed to foot flat contact and then to contact on forefoot without heel contact and trailing the R foot consistently.  Went from IV pole to [facility] with IV then RW as he degraded.  Stairs            Wheelchair Mobility    Modified Rankin (Stroke Patients Only) Modified Rankin (Stroke Patients Only) Pre-Morbid Rankin Score: No symptoms Modified Rankin: Moderately severe disability     Balance Overall balance assessment: Needs assistance Sitting-balance support: Single extremity supported;Feet supported Sitting balance-Leahy Scale: Fair     Standing balance support: Single extremity supported;Bilateral upper extremity supported Standing balance-Leahy Scale: Fair(and ended up poor.)                               Pertinent Vitals/Pain      Home Living Family/patient expects to be discharged to:: Private residence Living Arrangements: Non-relatives/Friends(Travis) Available Help at Discharge: Friend(s);Available 24 hours/day Type of Home: Other(Comment)(condo) Home Access: Stairs to enter Entrance Stairs-Rails: Right;Left Entrance Stairs-Number of Steps: 3 flights Home Layout: One level Home Equipment: Shower seat - built in      Prior Function Level of Independence: Independent               Hand Dominance   Dominant  Hand: Right    Extremity/Trunk Assessment   Upper Extremity Assessment Upper Extremity Assessment: Defer to OT evaluation    Lower Extremity Assessment Lower Extremity Assessment: LLE deficits/detail;RLE deficits/detail RLE Deficits / Details: WFL LLE Deficits / Details:  functional strength, pt able to isolate movements.  quick to fatigue and lose coordination during gait. LLE Coordination: decreased fine motor       Communication   Communication: HOH  Cognition Arousal/Alertness: Awake/alert Behavior During Therapy: WFL for tasks assessed/performed Overall Cognitive Status: Within Functional Limits for tasks assessed                                        General Comments General comments (skin integrity, edema, etc.): vss throughout.    Exercises     Assessment/Plan    PT Assessment Patient needs continued PT services  PT Problem List Decreased strength;Decreased activity tolerance;Decreased balance;Decreased mobility;Decreased coordination;Decreased knowledge of use of DME;Cardiopulmonary status limiting activity       PT Treatment Interventions DME instruction;Gait training;Stair training;Functional mobility training;Therapeutic activities;Balance training;Patient/family education;Neuromuscular re-education    PT Goals (Current goals can be found in the Care Plan section)  Acute Rehab PT Goals Patient Stated Goal: get back going,  agreeable to goin to rehab if possible PT Goal Formulation: With patient Time For Goal Achievement: 01/17/20 Potential to Achieve Goals: Good    Frequency Min 3X/week   Barriers to discharge        Co-evaluation               AM-PAC PT "6 Clicks" Mobility  Outcome Measure Help needed turning from your back to your side while in a flat bed without using bedrails?: A Little Help needed moving from lying on your back to sitting on the side of a flat bed without using bedrails?: A Little Help needed moving to and from a bed to a chair (including a wheelchair)?: A Little Help needed standing up from a chair using your arms (e.g., wheelchair or bedside chair)?: A Little Help needed to walk in hospital room?: A Lot Help needed climbing 3-5 steps with a railing? : A Little 6 Click Score:  17    End of Session   Activity Tolerance: Patient tolerated treatment well;Patient limited by fatigue(muscle fatigue not general fatigue) Patient left: in bed;with call bell/phone within reach;with family/visitor present Nurse Communication: Mobility status PT Visit Diagnosis: Unsteadiness on feet (R26.81);Other abnormalities of gait and mobility (R26.89);Other symptoms and signs involving the nervous system (R29.898)    Time: [id number] PT Time Calculation (min) (ACUTE ONLY): 23 min   Charges:   PT Evaluation $PT Eval Moderate Complexity: 1 Mod          01/03/2020  Ginger Carne., PT Acute Rehabilitation Services [phone]  (pager) [phone]  (office)  Tessie Fass Tahnee Cifuentes 01/03/2020, 1:54 PM

## 2020-01-03 NOTE — Progress Notes (Signed)
Rehab Admissions Coordinator Note:  Patient was screened by Cleatrice Burke for appropriateness for an Inpatient Acute Rehab Consult per therapy recs.  At this time, we are recommending Inpatient Rehab consult. I will place order per protocol.  Cleatrice Burke RN MSN 01/03/2020, 4:37 PM  I can be reached at [phone].

## 2020-01-03 NOTE — Progress Notes (Signed)
VASCULAR LAB PRELIMINARY  PRELIMINARY  PRELIMINARY  PRELIMINARY  Carotid duplex completed.    Preliminary report:  See CV proc for preliminary results.   Madelyne Millikan, RVT 01/03/2020, 10:44 AM

## 2020-01-03 NOTE — ED Notes (Signed)
Breakfast ordered 

## 2020-01-04 DIAGNOSIS — I5043 Acute on chronic combined systolic (congestive) and diastolic (congestive) heart failure: Secondary | ICD-10-CM | POA: Diagnosis not present

## 2020-01-04 DIAGNOSIS — I6340 Cerebral infarction due to embolism of unspecified cerebral artery: Secondary | ICD-10-CM | POA: Diagnosis not present

## 2020-01-04 MED ORDER — IPRATROPIUM-ALBUTEROL 0.5-2.5 (3) MG/3ML IN SOLN: 3.0000 mL | Freq: Four times a day (QID) | RESPIRATORY_TRACT | Status: DC | PRN

## 2020-01-04 MED ORDER — CLOPIDOGREL BISULFATE 75 MG PO TABS
75.0000 mg | ORAL_TABLET | Freq: Every day | ORAL | Status: DC
  Administered 2020-01-04 – 2020-01-06 (×3): 75 mg via ORAL
  Filled 2020-01-04 (×4): qty 1

## 2020-01-04 NOTE — Evaluation (Signed)
Speech Language Pathology Evaluation Patient Details Name: Travis Salinas MRN: [id number] DOB: [birth year] Today's Date: 01/04/2020 Time: [id number] SLP Time Calculation (min) (ACUTE ONLY): 20 min  Problem List:  Patient Active Problem List   Diagnosis Date Noted  . Stroke (cerebrum) ([place]) 01/01/2020  . Normocytic anemia 06/04/2017  . COPD (chronic obstructive pulmonary disease) ([place]) 06/04/2017  . Pneumococcal bacteremia 05/28/2017  . Pneumococcal pneumonia ([place]) 05/27/2017  . IVDU (intravenous drug user)   . Liver fibrosis 04/24/2016  . Chronic hepatitis C without hepatic coma ([place]) 04/19/2015  . Substance abuse ([place]) 04/19/2015  . HTN (hypertension) 11/12/2013  . Depression 11/12/2013   Past Medical History:  Past Medical History:  Diagnosis Date  . Arthritis   . Cervical disc herniation   . COPD (chronic obstructive pulmonary disease) ([place])   . Headache    " due to antibiotics"  . Hepatitis C   . Hypertension    Past Surgical History:  Past Surgical History:  Procedure Laterality Date  . APPLICATION OF A-CELL OF EXTREMITY Left 12/05/2016   Procedure: APPLICATION OF A-CELL OF EXTREMITY;  Surgeon: Loel Lofty Dillingham, DO;  Location: [place];  Service: Plastics;  Laterality: Left;  . APPLICATION OF WOUND VAC Left 12/05/2016   Procedure: APPLICATION OF WOUND VAC;  Surgeon: Loel Lofty Dillingham, DO;  Location: [place];  Service: Plastics;  Laterality: Left;  . I & D EXTREMITY Left 11/03/2016   Procedure: IRRIGATION AND DEBRIDEMENT EXTREMITY;  Surgeon: Leandrew Koyanagi, MD;  Location: [place];  Service: Orthopedics;  Laterality: Left;  . I & D EXTREMITY Left 12/05/2016   Procedure: IRRIGATION AND DEBRIDEMENT EXTREMITY;  Surgeon: Loel Lofty Dillingham, DO;  Location: [place];  Service: Plastics;  Laterality: Left;  . INCISION AND DRAINAGE ABSCESS Right 11/12/2013   Procedure: INCISION AND DRAINAGE AND OPEN PACKING OF RIGHT CALF  ABSCESS;  Surgeon: Earnstine Regal, MD;  Location: [facility];  Service:  General;  Laterality: Right;  . INCISION AND DRAINAGE OF WOUND Left 01/29/2017   Procedure: IRRIGATION AND DEBRIDEMENT OF LEFT LEG WOUND WITH ABRA PLACEMENT AND PLACEMENT OF WOUND VAC;  Surgeon: Wallace Going, DO;  Location: [facility];  Service: Plastics;  Laterality: Left;  . KNEE SURGERY     HPI:  Pt is a 69 year old man admitted with falls and L side weakness. Admininstered tPA.  Hospital course complicated by tachycardia. Cardioverted 4/25.  Pt PMH: COPD, CHF, arthritis, HepC, HTN, cocaine abuse. MRI scan shows cerebral small punctate embolic infarcts involving bilateral ACA, left MCA and bilateral cerebellar hemispheres.   Assessment / Plan / Recommendation Clinical Impression  Pt presents with normal expressive/receptive language, fluent and clear speech without dysarthria.  Basic recall of verbal information is functional (immediate, delayed after 2", delayed after 5"); pt able to recall events of day and therapies in which he participated.  Oriented x4. Selective and alternating attention were WNL; higher level organization, judgment/self-monitoring were impaired, however, baseline function is unknown.  Pt may benefit from a more in-depth cognitive assessment once admitted to CIR. No further acute SLP needs are identified.     SLP Assessment  SLP Recommendation/Assessment: All further Speech Lanaguage Pathology  needs can be addressed in the next venue of care SLP Visit Diagnosis: Cognitive communication deficit (R41.841)    Follow Up Recommendations  Inpatient Rehab    Frequency and Duration           SLP Evaluation Cognition  Overall Cognitive Status: Difficult to assess Arousal/Alertness:  Awake/alert Orientation Level: Oriented X4 Attention: Alternating Alternating Attention: Appears intact Memory: Appears intact Awareness: Impaired Awareness Impairment: Anticipatory impairment Problem Solving: Impaired Executive Function: Self Monitoring;Organizing Organizing:  Impaired Organizing Impairment: Verbal complex Self Monitoring: Impaired Self Monitoring Impairment: Verbal complex Behaviors: Impulsive Safety/Judgment: Impaired       Comprehension  Auditory Comprehension Overall Auditory Comprehension: Appears within functional limits for tasks assessed Visual Recognition/Discrimination Discrimination: Within Function Limits Reading Comprehension Reading Status: Not tested    Expression Expression Primary Mode of Expression: Verbal Verbal Expression Overall Verbal Expression: Appears within functional limits for tasks assessed Written Expression Dominant Hand: Right Written Expression: Not tested   Oral / Motor  Oral Motor/Sensory Function Overall Oral Motor/Sensory Function: Within functional limits Motor Speech Overall Motor Speech: Appears within functional limits for tasks assessed   GO                    Travis Salinas 01/04/2020, 3:07 PM Travis Salinas L. Tivis Ringer, [place] Office number [phone] Pager [phone]

## 2020-01-04 NOTE — Progress Notes (Signed)
Physical Therapy Treatment Patient Details Name: Travis Salinas MRN: [id number] DOB: 1950/12/29 Today's Date: 01/04/2020    History of Present Illness Pt is a 69 year old man admitted with falls and L side weakness. Admininstered tPA. CT scan negative for acute changes, MRI results pending. Hospital course complicated by tachycardia. Cardioverted 4/25.  Pt PMH: COPD, CHF, arthritis, HepC, HTN, cocaine abuse.     PT Comments    Patient is making progress toward PT goals and eager to participate in therapy. Continue to recommend CIR for further skilled PT services to maximize independence and safety with mobility.    Follow Up Recommendations  CIR;Supervision/Assistance - 24 hour     Equipment Recommendations  Rolling walker with 5" wheels    Recommendations for Other Services       Precautions / Restrictions Precautions Precautions: Fall Restrictions Weight Bearing Restrictions: No    Mobility  Bed Mobility Overal bed mobility: Modified Independent Bed Mobility: Supine to Sit;Sit to Supine           General bed mobility comments: increased time and effort; use of rail  Transfers Overall transfer level: Needs assistance Equipment used: Rolling walker (2 wheeled) Transfers: Sit to/from Stand Sit to Stand: Min guard         General transfer comment: min guard for safety  Ambulation/Gait Ambulation/Gait assistance: Min assist;Min guard Gait Distance (Feet): (100 ft with AD and 10 ft without AD) Assistive device: Rolling walker (2 wheeled)(assist at trunk with gait belt) Gait Pattern/deviations: Step-through pattern;Decreased stride length;Drifts right/left Gait velocity: varying speeds   General Gait Details: min guard assist with use of RW and min A required without UE support due to impaired balance; pt with fast, unsafe gait speed without use of AD; cues required for safety and to navigate environment as pt tends to run into objects on L side when using RW;  SOB with ambulation   Stairs             Wheelchair Mobility    Modified Rankin (Stroke Patients Only) Modified Rankin (Stroke Patients Only) Pre-Morbid Rankin Score: No symptoms Modified Rankin: Moderately severe disability     Balance Overall balance assessment: Needs assistance Sitting-balance support: Feet supported Sitting balance-Leahy Scale: Fair     Standing balance support: Single extremity supported;Bilateral upper extremity supported Standing balance-Leahy Scale: Fair Standing balance comment: able to maintain static standing without UE support; assist/support needed for gait                             Cognition Arousal/Alertness: Awake/alert Behavior During Therapy: WFL for tasks assessed/performed Overall Cognitive Status: Within Functional Limits for tasks assessed Area of Impairment: Safety/judgement                         Safety/Judgement: Decreased awareness of safety;Decreased awareness of deficits            Exercises      General Comments        Pertinent Vitals/Pain Pain Assessment: No/denies pain    Home Living     Available Help at Discharge: Friend(s);Available 24 hours/day Type of Home: Other(Comment)(condo)              Prior Function            PT Goals (current goals can now be found in the care plan section) Progress towards PT goals: Progressing toward goals  Frequency    Min 3X/week      PT Plan Current plan remains appropriate    Co-evaluation              AM-PAC PT "6 Clicks" Mobility   Outcome Measure  Help needed turning from your back to your side while in a flat bed without using bedrails?: None Help needed moving from lying on your back to sitting on the side of a flat bed without using bedrails?: A Little Help needed moving to and from a bed to a chair (including a wheelchair)?: A Little Help needed standing up from a chair using your arms (e.g., wheelchair  or bedside chair)?: A Little Help needed to walk in hospital room?: A Little Help needed climbing 3-5 steps with a railing? : A Little 6 Click Score: 19    End of Session Equipment Utilized During Treatment: Gait belt Activity Tolerance: Patient tolerated treatment well Patient left: in bed;with call bell/phone within reach;with bed alarm set Nurse Communication: Mobility status PT Visit Diagnosis: Unsteadiness on feet (R26.81);Other abnormalities of gait and mobility (R26.89);Other symptoms and signs involving the nervous system (R29.898)     Time: [id number] PT Time Calculation (min) (ACUTE ONLY): 13 min  Charges:  $Gait Training: 8-22 mins                     Earney Navy, PTA Acute Rehabilitation Services Pager: [phone] Office: [phone]     Darliss Cheney 01/04/2020, 4:33 PM

## 2020-01-04 NOTE — Progress Notes (Addendum)
Progress Note  Patient Name: Travis Salinas Date of Encounter: 01/04/2020  Primary Cardiologist: Dr. Marlou Porch   Subjective   Feeling well today. No pain. Working with PT.   Inpatient Medications    Scheduled Meds: .  stroke: mapping our early stages of recovery book   Does not apply Once  . amantadine  50 mg Oral BID  . amiodarone  200 mg Oral BID  . ARIPiprazole  10 mg Oral Daily  . aspirin EC  81 mg Oral Daily  . atorvastatin  40 mg Oral Daily  . benzonatate  200 mg Oral TID  . carvedilol  3.125 mg Oral BID WC  . Chlorhexidine Gluconate Cloth  6 each Topical Daily  . clonazePAM  1 mg Oral BID  . FLUoxetine  10 mg Oral Daily  . ipratropium-albuterol  3 mL Nebulization TID  . lamoTRIgine  200 mg Oral q morning - 10a  . pantoprazole  40 mg Oral QHS  . sodium chloride flush  10-40 mL Intracatheter Q12H  . tamsulosin  0.4 mg Oral Daily   Continuous Infusions: . sodium chloride 75 mL/hr at 01/03/20 2140  . clevidipine Stopped (01/01/20 2305)   PRN Meds: acetaminophen **OR** acetaminophen (TYLENOL) oral liquid 160 mg/5 mL **OR** acetaminophen, albuterol, oxyCODONE, senna-docusate, sodium chloride flush   Vital Signs    Vitals:   01/04/20 0500 01/04/20 0541 01/04/20 0600 01/04/20 0842  BP:  108/65  116/61  Pulse: 71 70 69 87  Resp: 14 15 14 15   Temp:    97.7 F (36.5 C)  TempSrc:    Oral  SpO2: 99% 100%  99%  Weight:      Height:        Intake/Output Summary (Last 24 hours) at 01/04/2020 1037 Last data filed at 01/04/2020 0600 Gross per 24 hour  Intake 863.61 ml  Output 575 ml  Net 288.61 ml   Filed Weights   01/01/20 2106  Weight: 68 kg    Physical Exam   General: Well developed, well nourished, NAD Neck: Negative for carotid bruits. No JVD Lungs:Clear to ausculation bilaterally. Breathing is unlabored. Cardiovascular: RRR with S1 S2. No murmurs, rubs, gallops, or LV heave appreciated. Abdomen: Soft, non-tender, non-distended with normoactive  bowel sounds. No hepatomegaly, No rebound/guarding. No obvious abdominal masses. MSK: Strength and tone appear normal for age. 5/5 in all extremities Extremities: No edema. No clubbing or cyanosis. DP/PT pulses 2+ bilaterally Neuro: Alert and oriented. No focal deficits. No facial asymmetry. MAE spontaneously. Psych: Responds to questions appropriately with normal affect.    Labs    Chemistry Recent Labs  Lab 01/01/20 2210 01/01/20 2232 01/02/20 1934  NA 137 139 140  K 4.1 3.8 3.6  CL 101 105 107  CO2 20*  --  20*  GLUCOSE 99 94 124*  BUN 41* 42* 29*  CREATININE 1.44* 1.50* 1.31*  CALCIUM 10.3  --  9.1  PROT 7.3  --   --   ALBUMIN 4.4  --   --   AST 58*  --   --   ALT 36  --   --   ALKPHOS 106  --   --   BILITOT 1.3*  --   --   GFRNONAA 50*  --  56*  GFRAA 58*  --  >60  ANIONGAP 16*  --  13     Hematology Recent Labs  Lab 01/01/20 2210 01/01/20 2232 01/02/20 1934  WBC 14.8*  --  13.5*  RBC 6.21*  --  5.13  HGB 16.7 18.4* 13.8  HCT 54.5* 54.0* 44.4  MCV 87.8  --  86.5  MCH 26.9  --  26.9  MCHC 30.6  --  31.1  RDW 13.4  --  13.2  PLT 284  --  284    Cardiac EnzymesNo results for input(s): TROPONINI in the last 168 hours. No results for input(s): TROPIPOC in the last 168 hours.   BNPNo results for input(s): BNP, PROBNP in the last 168 hours.   DDimer No results for input(s): DDIMER in the last 168 hours.   Radiology    CT HEAD WO CONTRAST  Result Date: 01/03/2020 CLINICAL DATA:  Stroke, post tPA EXAM: CT HEAD WITHOUT CONTRAST TECHNIQUE: Contiguous axial images were obtained from the base of the skull through the vertex without intravenous contrast. Sagittal and coronal MPR images reconstructed from axial data set. COMPARISON:  01/01/2020 FINDINGS: Brain: Generalized atrophy. Normal ventricular morphology. No midline shift or mass effect. Small vessel chronic ischemic changes of deep cerebral white matter. No intracranial hemorrhage, mass lesion, evidence of  acute infarction, or extra-axial fluid collection. Vascular: Atherosclerotic calcifications of internal carotid and vertebral arteries at skull base. No hyperdense vessels. Skull: Intact Sinuses/Orbits: Large mucosal retention cyst of the LEFT maxillary sinus unchanged. Remaining paranasal sinuses and mastoid air cells clear Other: N/A IMPRESSION: Atrophy with small vessel chronic ischemic changes of deep cerebral white matter. No acute intracranial abnormalities. Electronically Signed   By: Lavonia Dana M.D.   On: 01/03/2020 08:56   MR ANGIO HEAD WO CONTRAST  Result Date: 01/03/2020 CLINICAL DATA:  Stroke, follow-up EXAM: MRI HEAD WITHOUT CONTRAST MRA HEAD WITHOUT CONTRAST TECHNIQUE: Multiplanar, multiecho pulse sequences of the brain and surrounding structures were obtained without intravenous contrast. Angiographic images of the head were obtained using MRA technique without contrast. COMPARISON:  MRI brain 2014 FINDINGS: MRI HEAD FINDINGS Brain: There is a small area restricted diffusion within the parasagittal right frontal lobe. Additional smaller areas restricted diffusion are present in bilateral frontal lobes, left parietal lobe, and bilateral cerebellar hemispheres. There is no evidence of intracranial hemorrhage. Prominence of the ventricles and sulci reflects generalized parenchymal volume loss. Patchy foci of T2 hyperintensity in the supratentorial white matter nonspecific but may reflect mild to moderate chronic microvascular ischemic changes. There is no intracranial mass or mass effect. There is no hydrocephalus or extra-axial fluid collection. Vascular: Major vessel flow voids at the skull base are preserved. Skull and upper cervical spine: Normal marrow signal is preserved. Sinuses/Orbits: Left maxillary sinus mucosal thickening. Orbits are unremarkable. Other: Sella is unremarkable.  Mastoid air cells are clear. MRA HEAD FINDINGS Intracranial internal carotid arteries are patent. Middle and  anterior cerebral arteries are patent. Intracranial vertebral arteries, basilar artery, posterior cerebral arteries are patent. Right posterior communicating artery is present with fetal origin of the right PCA. There is no significant stenosis or aneurysm. IMPRESSION: Several small acute infarcts involving multiple vascular territories bilaterally suggesting a central source. No proximal intracranial vessel occlusion or significant stenosis. Chronic microvascular ischemic changes. Electronically Signed   By: Macy Mis M.D.   On: 01/03/2020 15:20   MR BRAIN WO CONTRAST  Result Date: 01/03/2020 CLINICAL DATA:  Stroke, follow-up EXAM: MRI HEAD WITHOUT CONTRAST MRA HEAD WITHOUT CONTRAST TECHNIQUE: Multiplanar, multiecho pulse sequences of the brain and surrounding structures were obtained without intravenous contrast. Angiographic images of the head were obtained using MRA technique without contrast. COMPARISON:  MRI brain 2014 FINDINGS: MRI HEAD FINDINGS  Brain: There is a small area restricted diffusion within the parasagittal right frontal lobe. Additional smaller areas restricted diffusion are present in bilateral frontal lobes, left parietal lobe, and bilateral cerebellar hemispheres. There is no evidence of intracranial hemorrhage. Prominence of the ventricles and sulci reflects generalized parenchymal volume loss. Patchy foci of T2 hyperintensity in the supratentorial white matter nonspecific but may reflect mild to moderate chronic microvascular ischemic changes. There is no intracranial mass or mass effect. There is no hydrocephalus or extra-axial fluid collection. Vascular: Major vessel flow voids at the skull base are preserved. Skull and upper cervical spine: Normal marrow signal is preserved. Sinuses/Orbits: Left maxillary sinus mucosal thickening. Orbits are unremarkable. Other: Sella is unremarkable.  Mastoid air cells are clear. MRA HEAD FINDINGS Intracranial internal carotid arteries are  patent. Middle and anterior cerebral arteries are patent. Intracranial vertebral arteries, basilar artery, posterior cerebral arteries are patent. Right posterior communicating artery is present with fetal origin of the right PCA. There is no significant stenosis or aneurysm. IMPRESSION: Several small acute infarcts involving multiple vascular territories bilaterally suggesting a central source. No proximal intracranial vessel occlusion or significant stenosis. Chronic microvascular ischemic changes. Electronically Signed   By: Macy Mis M.D.   On: 01/03/2020 15:20   ECHOCARDIOGRAM COMPLETE  Result Date: 01/02/2020    ECHOCARDIOGRAM REPORT   Patient Name:   Travis Salinas Date of Exam: 01/02/2020 Medical Rec #:  [id number]          Height:       71.0 in Accession #:    [id number]         Weight:       150.0 lb Date of Birth:  1951-08-25          BSA:          1.866 m Patient Age:    69 years           BP:           112/91 mmHg Patient Gender: M                  HR:           81 bpm. Exam Location:  Inpatient Procedure: 2D Echo, Color Doppler and Cardiac Doppler                                MODIFIED REPORT:   This report was modified by Candee Furbish MD on 01/02/2020 due to [facility] comment                                     added.  Indications:     Stroke 434.91/II63.9  History:         Patient has prior history of Echocardiogram examinations, most                  recent 05/31/2021.  Sonographer:     Merrie Roof RDCS Referring Phys:  [id number] ERIC LINDZEN Diagnosing Phys: Candee Furbish MD IMPRESSIONS  1. Left ventricular ejection fraction, by estimation, is 35 to 40%. The left ventricle has moderately decreased function. The left ventricle demonstrates regional wall motion abnormalities (see scoring diagram/findings for description). There is moderate left ventricular hypertrophy. Left ventricular diastolic parameters are consistent with Grade I diastolic dysfunction (impaired relaxation). Has Takotsubo like  appearance (stress induced cardiomyopathy).  2. Right ventricular systolic function is normal. The right ventricular size is normal.  3. The mitral valve is normal in structure. Trivial mitral valve regurgitation. No evidence of mitral stenosis.  4. The aortic valve is normal in structure. Aortic valve regurgitation is not visualized. No aortic stenosis is present.  5. The inferior vena cava is normal in size with greater than 50% respiratory variability, suggesting right atrial pressure of 3 mmHg. Comparison(s): The left ventricular function is worsened. LV function appeared normal in 2018. Conclusion(s)/Recommendation(s): No intracardiac source of embolism detected on this transthoracic study. A transesophageal echocardiogram is recommended to exclude cardiac source of embolism if clinically indicated. FINDINGS  Left Ventricle: Left ventricular ejection fraction, by estimation, is 35 to 40%. The left ventricle has moderately decreased function. The left ventricle demonstrates regional wall motion abnormalities. The left ventricular internal cavity size was normal in size. There is moderate left ventricular hypertrophy. Left ventricular diastolic parameters are consistent with Grade I diastolic dysfunction (impaired relaxation).  LV Wall Scoring: The entire apex is akinetic. Has Takotsubo like appearance (stress induced cardiomyopathy). Right Ventricle: The right ventricular size is normal. No increase in right ventricular wall thickness. Right ventricular systolic function is normal. Left Atrium: Left atrial size was normal in size. Right Atrium: Right atrial size was normal in size. Pericardium: There is no evidence of pericardial effusion. Mitral Valve: The mitral valve is normal in structure. Normal mobility of the mitral valve leaflets. Trivial mitral valve regurgitation. No evidence of mitral valve stenosis. Tricuspid Valve: The tricuspid valve is normal in structure. Tricuspid valve regurgitation is not  demonstrated. No evidence of tricuspid stenosis. Aortic Valve: The aortic valve is normal in structure. Aortic valve regurgitation is not visualized. No aortic stenosis is present. Pulmonic Valve: The pulmonic valve was normal in structure. Pulmonic valve regurgitation is mild. No evidence of pulmonic stenosis. Aorta: The aortic root is normal in size and structure. Venous: The inferior vena cava is normal in size with greater than 50% respiratory variability, suggesting right atrial pressure of 3 mmHg. IAS/Shunts: No atrial level shunt detected by color flow Doppler.  LEFT VENTRICLE PLAX 2D LVIDd:         4.60 cm     Diastology LVIDs:         3.00 cm     LV e' lateral:   5.55 cm/s LV PW:         1.50 cm     LV E/e' lateral: 9.6 LV IVS:        1.30 cm     LV e' medial:    6.64 cm/s LVOT diam:     2.10 cm     LV E/e' medial:  8.0 LV SV:         39 LV SV Index:   21 LVOT Area:     3.46 cm  LV Volumes (MOD) LV vol d, MOD A2C: 69.3 ml LV vol d, MOD A4C: 76.4 ml LV vol s, MOD A2C: 46.7 ml LV vol s, MOD A4C: 52.7 ml LV SV MOD A2C:     22.6 ml LV SV MOD A4C:     76.4 ml LV SV MOD BP:      23.3 ml RIGHT VENTRICLE             IVC RV Basal diam:  3.10 cm     IVC diam: 1.50 cm RV S prime:     11.90 cm/s TAPSE (M-mode): 2.0 cm LEFT ATRIUM  Index       RIGHT ATRIUM           Index LA diam:        2.70 cm 1.45 cm/m  RA Area:     13.50 cm LA Vol (A2C):   43.4 ml 23.26 ml/m RA Volume:   30.40 ml  16.29 ml/m LA Vol (A4C):   34.8 ml 18.65 ml/m LA Biplane Vol: 40.8 ml 21.87 ml/m  AORTIC VALVE LVOT Vmax:   62.00 cm/s LVOT Vmean:  44.500 cm/s LVOT VTI:    0.112 m  AORTA Ao Root diam: 3.70 cm Ao Asc diam:  3.30 cm MITRAL VALVE MV Area (PHT): 3.54 cm    SHUNTS MV Decel Time: 214 msec    Systemic VTI:  0.11 m MV E velocity: 53.10 cm/s  Systemic Diam: 2.10 cm MV A velocity: 63.80 cm/s MV E/A ratio:  0.83 Candee Furbish MD Electronically signed by Candee Furbish MD Signature Date/Time: 01/02/2020/3:35:22 PM    Final (Updated)     CAROTID (at [facility] and [facility] only)  Result Date: 01/03/2020 Carotid Arterial Duplex Study Indications:       Weakness and New onset of gait instability. Risk Factors:      Hypertension. Other Factors:     Cervical disc herniation. Limitations        Today's exam was limited due to movement, body habitus. Comparison Study:  No prior study on file Performing Technologist: Sharion Dove RVS  Examination Guidelines: A complete evaluation includes B-mode imaging, spectral Doppler, color Doppler, and power Doppler as needed of all accessible portions of each vessel. Bilateral testing is considered an integral part of a complete examination. Limited examinations for reoccurring indications may be performed as noted.  Right Carotid Findings: +----------+--------+--------+--------+------------------+------------------+           PSV cm/sEDV cm/sStenosisPlaque DescriptionComments           +----------+--------+--------+--------+------------------+------------------+ CCA Prox  61      8                                 intimal thickening +----------+--------+--------+--------+------------------+------------------+ CCA Distal43      9                                 intimal thickening +----------+--------+--------+--------+------------------+------------------+ ICA Prox  50      13                                                   +----------+--------+--------+--------+------------------+------------------+ ICA Distal84      27                                                   +----------+--------+--------+--------+------------------+------------------+ ECA       119     17                                                   +----------+--------+--------+--------+------------------+------------------+ +----------+--------+-------+--------+-------------------+  PSV cm/sEDV cmsDescribeArm Pressure (mmHG) +----------+--------+-------+--------+-------------------+ [id number]                                          +----------+--------+-------+--------+-------------------+ +---------+--------+--------+--------------+ VertebralPSV cm/sEDV cm/sNot identified +---------+--------+--------+--------------+  Left Carotid Findings: +----------+--------+--------+--------+------------------+------------------+           PSV cm/sEDV cm/sStenosisPlaque DescriptionComments           +----------+--------+--------+--------+------------------+------------------+ CCA Prox  54      8                                 intimal thickening +----------+--------+--------+--------+------------------+------------------+ CCA Distal50      11                                intimal thickening +----------+--------+--------+--------+------------------+------------------+ ICA Prox  32      9                                                    +----------+--------+--------+--------+------------------+------------------+ ICA Distal45      8                                                    +----------+--------+--------+--------+------------------+------------------+ ECA       74      9                                                    +----------+--------+--------+--------+------------------+------------------+ +----------+--------+--------+--------+-------------------+           PSV cm/sEDV cm/sDescribeArm Pressure (mmHG) +----------+--------+--------+--------+-------------------+ [id number]                                         +----------+--------+--------+--------+-------------------+ +---------+--------+--+--------+-+ VertebralPSV cm/s28EDV cm/s8 +---------+--------+--+--------+-+   Summary: Right Carotid: The extracranial vessels were near-normal with only minimal wall                thickening or plaque. Left Carotid: The extracranial vessels were near-normal with only minimal wall               thickening or plaque. Vertebrals:  Left  vertebral artery demonstrates antegrade flow. Bilateral              vertebral arteries were not visualized. Subclavians: Normal flow hemodynamics were seen in bilateral subclavian              arteries. *See table(s) above for measurements and observations.  Electronically signed by Antony Contras MD on 01/03/2020 at 1:02:01 PM.    Final    Telemetry    01/04/20 NSR with infrequent PVCs - Personally Reviewed  ECG    No new tracing as of 01/04/20- Personally Reviewed  Cardiac Studies   Echocardiogram 01/02/20:  1. Left ventricular ejection fraction, by  estimation, is 35 to 40%. The  left ventricle has moderately decreased function. The left ventricle  demonstrates regional wall motion abnormalities (see scoring  diagram/findings for description). There is  moderate left ventricular hypertrophy. Left ventricular diastolic  parameters are consistent with Grade I diastolic dysfunction (impaired  relaxation). Has Takotsubo like appearance (stress induced  cardiomyopathy).  2. Right ventricular systolic function is normal. The right ventricular  size is normal.  3. The mitral valve is normal in structure. Trivial mitral valve  regurgitation. No evidence of mitral stenosis.  4. The aortic valve is normal in structure. Aortic valve regurgitation is  not visualized. No aortic stenosis is present.  5. The inferior vena cava is normal in size with greater than 50%  respiratory variability, suggesting right atrial pressure of 3 mmHg.   Patient Profile     69 y.o. male with a history of COPD (emphysema), hepatitis C, HTN, CKD IIIa, and polysubstance use (heroin and cocaine) who presented yesterday with acute onset gait instability and neurologic deficits most consistent with acute stroke and is now s/p tPA. He is being seen today for the evaluation of wide complex tachycardia  Assessment & Plan     1. Acute on chronic combined systolic and diastolic congestive heart failure:  -Echocardiogram  with LVEF at 35 to 40% felt to be secondary to cocaine use. Noted to haveTakotsubo-like pattern to his LV contractility consistent with diffuse coronary spasm -Continue carvedilol?? -Add ARB as blood pressure tolerates>>will not start today due to soft BPs   2.  Cocaine use:  -Cessation encouraged  3.  Wide-complex tachycardia: -Per Dr. Acie Fredrickson note>appears to be due to sinus rhythm with underlying bundle branch block.   -Maintaining sinus rhythm with infrequent premature ventricular contractions on telemetry today  -Continue PO Amiodarone 200mg  PO BID  4.  Elevated troponin levels:  -639>>585>>felt to be consistent with demand ischemia and Takotsubo syndrome. -No current plans for ischemic evaluation    Signed, Kathyrn Drown NP-C [place] Pager: [phone] 01/04/2020, 10:37 AM     For questions or updates, please contact   Please consult [email or web address] for contact info under Cardiology/STEMI.   Attending Note:   The patient was seen and examined.  Agree with assessment and plan as noted above.  Changes made to the above note as needed.  Patient seen and independently examined with  Kathyrn Drown, NP .   We discussed all aspects of the encounter. I agree with the assessment and plan as stated above.  1.   Acute on chronic combined systolic and diastolic congestive heart:.  He has a current ejection fraction of 35 to 40% thought to be secondary to cocaine use.  Echo has a Takotsubo-like pattern consistent with diffuse coronary spasm.  Continue carvedilol. Blood pressure is a little too low to consider ARB at this time but will consider low-dose losartan as his blood pressure improves.  2.  Wide-complex tachycardia.  I suspect he had sinus tachycardia in the setting of underlying bundle branch block. Currently on amiodarone 200 mg twice a day.  Will consider decreasing to 2 mg once a day at discharge.     I have spent a total of 40 minutes with patient reviewing hospital   notes , telemetry, EKGs, labs and examining patient as well as establishing an assessment and plan that was discussed with the patient. > 50% of time was spent in direct patient care.    Thayer Headings, Brooke Bonito., MD, [facility] 01/04/2020, 1:42 PM [address],  [place] Pager [phone]

## 2020-01-04 NOTE — Consult Note (Signed)
Physical Medicine and Rehabilitation Consult Reason for Consult: Left side weakness Referring Physician: Dr. Leonie Man   HPI: Travis Salinas is a 69 y.o. right-handed male with history of COPD, hepatitis C, polysubstance abuse, hypertension.  Per chart review patient lives with friends.  Independent prior to admission.  1 level home with 3 flights of stairs.  Presented 01/01/2020 with gait instability, dizziness and left-sided weakness as well reported of numerous falls without loss of consciousness.  His blood pressure ranged from 110/62-102/68.  Cranial CT scan negative for acute changes.  Noted chronic small vessel changes of the white matter.  Patient did not receive TPA.  CT cervical spine negative for acute process.  MRI/MRA showed several small acute infarcts involving multiple vascular territories bilaterally suggesting a central source.  No proximal intracranial vessel occlusion or significant stenosis.  Echocardiogram with ejection fraction of [id number] grade 1 diastolic dysfunction.  Admission chemistries with WBC 14,800, BUN 41, creatinine 1.44, urine drug screen positive opiates cocaine and benzos, troponin high-sensitivity 585, CK 106..  Carotid Dopplers bilateral ICA stenosis 1/39%.  Cardiology services consulted for complex tachycardia with underlying bundle branch block and was placed on intravenous amiodarone as well as started on Coreg 3.125 mg twice daily..  Attempts at cardioversion were unsuccessful.  Currently maintained on aspirin for CVA prophylaxis.  Cleviprex had been initiated for blood pressure monitoring.  Tolerating a regular diet.  Therapy evaluations completed with recommendations of physical medicine rehab consult.   Review of Systems  Constitutional: Negative for chills and fever.  HENT: Negative for hearing loss.   Eyes: Negative for blurred vision and double vision.  Respiratory: Negative for cough and shortness of breath.   Cardiovascular: Negative for chest  pain, palpitations and leg swelling.  Gastrointestinal: Positive for constipation. Negative for heartburn, nausea and vomiting.  Genitourinary: Positive for urgency. Negative for dysuria, flank pain and hematuria.  Musculoskeletal: Positive for falls.  Skin: Negative for rash.  Neurological: Positive for dizziness, weakness and headaches.  Psychiatric/Behavioral: Positive for depression. The patient has insomnia.   All other systems reviewed and are negative.  Past Medical History:  Diagnosis Date   Arthritis    Cervical disc herniation    COPD (chronic obstructive pulmonary disease) ([place])    Headache    " due to antibiotics"   Hepatitis C    Hypertension    Past Surgical History:  Procedure Laterality Date   APPLICATION OF A-CELL OF EXTREMITY Left 12/05/2016   Procedure: APPLICATION OF A-CELL OF EXTREMITY;  Surgeon: Loel Lofty Dillingham, DO;  Location: [place];  Service: Plastics;  Laterality: Left;   APPLICATION OF WOUND VAC Left 12/05/2016   Procedure: APPLICATION OF WOUND VAC;  Surgeon: Loel Lofty Dillingham, DO;  Location: [place];  Service: Plastics;  Laterality: Left;   I & D EXTREMITY Left 11/03/2016   Procedure: IRRIGATION AND DEBRIDEMENT EXTREMITY;  Surgeon: Leandrew Koyanagi, MD;  Location: [place];  Service: Orthopedics;  Laterality: Left;   I & D EXTREMITY Left 12/05/2016   Procedure: IRRIGATION AND DEBRIDEMENT EXTREMITY;  Surgeon: Loel Lofty Dillingham, DO;  Location: [place];  Service: Plastics;  Laterality: Left;   INCISION AND DRAINAGE ABSCESS Right 11/12/2013   Procedure: INCISION AND DRAINAGE AND OPEN PACKING OF RIGHT CALF  ABSCESS;  Surgeon: Earnstine Regal, MD;  Location: [facility];  Service: General;  Laterality: Right;   INCISION AND DRAINAGE OF WOUND Left 01/29/2017   Procedure: IRRIGATION AND DEBRIDEMENT OF LEFT LEG WOUND  WITH ABRA PLACEMENT AND PLACEMENT OF WOUND VAC;  Surgeon: Wallace Going, DO;  Location: [facility];  Service: Plastics;  Laterality: Left;   KNEE SURGERY      Family History  Problem Relation Age of Onset   Heart disease Mother    Stroke Mother    Heart disease Father    Stroke Father    Social History:  reports that he quit smoking about 19 years ago. His smoking use included cigarettes. He has a 32.00 pack-year smoking history. He has never used smokeless tobacco. He reports current drug use. Drugs: Heroin and Cocaine. He reports that he does not drink alcohol. Allergies:  Allergies  Allergen Reactions   Propoxyphene Nausea And Vomiting   Amoxicillin Nausea And Vomiting   Ciprofloxacin Nausea Only   Depakote [Divalproex Sodium] Other (See Comments)    hallucinations   Medications Prior to Admission  Medication Sig Dispense Refill   albuterol (PROVENTIL) (2.5 MG/3ML) 0.083% nebulizer solution Take 3 mLs (2.5 mg total) by nebulization every 2 (two) hours as needed for wheezing or shortness of breath. 75 mL 12   albuterol (VENTOLIN HFA) 108 (90 Base) MCG/ACT inhaler Inhale 2 puffs into the lungs every 6 (six) hours as needed for wheezing or shortness of breath.     amantadine (SYMMETREL) 100 MG capsule Take 100 mg by mouth daily as needed (restless leg).      ARIPiprazole (ABILIFY) 10 MG tablet Take 10 mg by mouth daily.     atenolol (TENORMIN) 25 MG tablet Take 25 mg by mouth daily.      clonazePAM (KLONOPIN) 1 MG tablet Take 1 mg by mouth 2 (two) times daily.     FLUoxetine (PROZAC) 10 MG tablet Take 10 mg by mouth daily.     fluticasone (FLONASE) 50 MCG/ACT nasal spray Place 1 spray into both nostrils daily as needed for allergies.   0   ipratropium-albuterol (DUONEB) 0.5-2.5 (3) MG/3ML SOLN Take 3 mLs by nebulization 3 (three) times daily. 360 mL    lamoTRIgine (LAMICTAL) 200 MG tablet Take 1 tablet (200 mg total) by mouth every morning.     Multiple Vitamins-Minerals (MULTIVITAMIN WITH MINERALS) tablet Take 1 tablet by mouth daily.     OXYCONTIN 40 MG 12 hr tablet Take 40 mg by mouth 3 (three) times daily as  needed (pain).      silodosin (RAPAFLO) 8 MG CAPS capsule Take 8 mg daily with breakfast by mouth.     Tiotropium Bromide Monohydrate (SPIRIVA RESPIMAT) 2.5 MCG/ACT AERS Inhale 2 puffs into the lungs daily.     umeclidinium-vilanterol (ANORO ELLIPTA) 62.5-25 MCG/INH AEPB Inhale 1 puff into the lungs daily. 60 each 0   benzonatate (TESSALON) 100 MG capsule Take 2 capsules (200 mg total) by mouth 3 (three) times daily. (Patient not taking: Reported on 10/21/2019) 30 capsule 1   feeding supplement, ENSURE ENLIVE, (ENSURE ENLIVE) LIQD Take 237 mLs by mouth 2 (two) times daily between meals. (Patient not taking: Reported on 01/02/2020) 237 mL 12   ibuprofen (ADVIL,MOTRIN) 600 MG tablet Take 1 tablet (600 mg total) by mouth 4 (four) times daily. (Patient not taking: Reported on 01/02/2020) 30 tablet 0    Home: [place] expects to be discharged to:: Private residence Living Arrangements: Non-relatives/Friends([place]ravis) Available Help at Discharge: Friend(s), Available 24 hours/day Type of Home: Other(Comment)(condo) Home Access: Stairs to enter [employer] of Steps: 3 flights Entrance Stairs-Rails: Right, Left Home Layout: One level Bathroom Shower/Tub: Multimedia programmer: Standard  Home Equipment: Shower seat - built in  Functional History: Prior Function Level of Independence: Independent Functional Status:  Mobility: Bed Mobility Overal bed mobility: Needs Assistance Bed Mobility: Supine to Sit, Sit to Supine Supine to sit: Min guard Sit to supine: Min guard General bed mobility comments: Coming up to EOB no assist needed, but L UE came from behind and was used as an afterthough when pt's attention was focused to the L hand.  Pt repositioned himself into supine without assist other than feet blocking. Transfers Overall transfer level: Needs assistance Transfers: Sit to/from Stand Sit to Stand: Min assist General transfer comment: stability  assist Ambulation/Gait Ambulation/Gait assistance: Min assist, +2 physical assistance, +2 safety/equipment, Mod assist Gait Distance (Feet): 170 Feet Assistive device: IV Pole, 1 person hand held assist, Rolling walker (2 wheeled) Gait Pattern/deviations: Step-through pattern, Decreased step length - right, Decreased step length - left, Decreased stance time - left, Decreased stride length General Gait Details: pt's gait was mildly unsteady overall and degraded with longer distance.  gait was paretic with weak df clearance then degressed to foot flat contact and then to contact on forefoot without heel contact and trailing the R foot consistently.  Went from IV pole to [facility] with IV then RW as he degraded. Gait velocity: slower Gait velocity interpretation: <1.8 ft/sec, indicate of risk for recurrent falls    ADL: ADL Overall ADL's : Needs assistance/impaired Eating/Feeding: Minimal assistance, Bed level Grooming: Set up, Sitting, Wash/dry hands Upper Body Bathing: Minimal assistance, Sitting Lower Body Bathing: Moderate assistance, Sit to/from stand Upper Body Dressing : Minimal assistance, Sitting Lower Body Dressing: Moderate assistance, Sit to/from stand Toilet Transfer: Minimal assistance, Ambulation Toileting- Clothing Manipulation and Hygiene: Minimal assistance, Sit to/from stand Functional mobility during ADLs: Minimal assistance, +2 for safety/equipment General ADL Comments: pt fatigues with ambulation and becomes more unsafe  Cognition: Cognition Overall Cognitive Status: Within Functional Limits for tasks assessed Orientation Level: Oriented X4 Cognition Arousal/Alertness: Awake/alert Behavior During Therapy: WFL for tasks assessed/performed Overall Cognitive Status: Within Functional Limits for tasks assessed Area of Impairment: Safety/judgement Safety/Judgement: Decreased awareness of safety, Decreased awareness of deficits  Blood pressure 99/61, pulse 72, temperature  98.2 F (36.8 C), temperature source Oral, resp. rate 19, height 5\' 11"  (1.803 m), weight 68 kg, SpO2 99 %. Physical Exam  Nursing note and vitals reviewed. Constitutional: He appears well-developed and well-nourished. No distress.  Sitting up in bed- SLP in room; perseverative; inappropriate about medical issues- wanted [place] to "not tell his wife he's doing cocaine so they can get back together", NAD; focused on decrease in pain meds.   HENT:  Head: Normocephalic and atraumatic.  Nose: Nose normal.  Mouth/Throat: Oropharynx is clear and moist. No oropharyngeal exudate.  R tongue deviation  Eyes: Conjunctivae and EOM are normal. Right eye exhibits no discharge. Left eye exhibits no discharge.  Abrasion top of Nose on R No facial droop L nystagmus with EOMI B/L  Neck: No tracheal deviation present.  Cardiovascular: Normal rate and regular rhythm.  No murmur heard. Respiratory: Effort normal and breath sounds normal. No stridor. No respiratory distress. He has no wheezes. He has no rales.  GI: Soft. Bowel sounds are normal. He exhibits no distension. There is no abdominal tenderness. There is no rebound.  Musculoskeletal:     Cervical back: Normal range of motion and neck supple.     Comments: Tested RUE/LUE deltoid, biceps, triceps WE, grip and finger abd 5/5 LEs- HF, KE, KF, DF and PF  tested 5/5 B/L   Neurological:  Patient is alert in no acute distress.  Follows commands.  Provides his name and age.  Limited medical historian.  Normal sensation to light touch in all 4 extremities Finger to nose slowed, but intact B/L  Skin: Skin is warm and dry.  Psychiatric:  A little different responses- trying to hide drug use from wife, not [place]; focused on pain meds    Results for orders placed or performed during the hospital encounter of 01/01/20 (from the past 24 hour(s))  Glucose, capillary     Status: None   Collection Time: 01/03/20  6:20 AM  Result Value Ref Range   Glucose-Capillary  92 70 - 99 mg/dL   Comment 1 Notify RN   CK     Status: None   Collection Time: 01/03/20  7:18 AM  Result Value Ref Range   Total CK 106 49 - 397 U/L  MRSA PCR Screening     Status: None   Collection Time: 01/03/20 11:33 AM   Specimen: Nasal Mucosa; Nasopharyngeal  Result Value Ref Range   MRSA by PCR NEGATIVE NEGATIVE   CT HEAD WO CONTRAST  Result Date: 01/03/2020 CLINICAL DATA:  Stroke, post tPA EXAM: CT HEAD WITHOUT CONTRAST TECHNIQUE: Contiguous axial images were obtained from the base of the skull through the vertex without intravenous contrast. Sagittal and coronal MPR images reconstructed from axial data set. COMPARISON:  01/01/2020 FINDINGS: Brain: Generalized atrophy. Normal ventricular morphology. No midline shift or mass effect. Small vessel chronic ischemic changes of deep cerebral white matter. No intracranial hemorrhage, mass lesion, evidence of acute infarction, or extra-axial fluid collection. Vascular: Atherosclerotic calcifications of internal carotid and vertebral arteries at skull base. No hyperdense vessels. Skull: Intact Sinuses/Orbits: Large mucosal retention cyst of the LEFT maxillary sinus unchanged. Remaining paranasal sinuses and mastoid air cells clear Other: N/A IMPRESSION: Atrophy with small vessel chronic ischemic changes of deep cerebral white matter. No acute intracranial abnormalities. Electronically Signed   By: Lavonia Dana M.D.   On: 01/03/2020 08:56   MR ANGIO HEAD WO CONTRAST  Result Date: 01/03/2020 CLINICAL DATA:  Stroke, follow-up EXAM: MRI HEAD WITHOUT CONTRAST MRA HEAD WITHOUT CONTRAST TECHNIQUE: Multiplanar, multiecho pulse sequences of the brain and surrounding structures were obtained without intravenous contrast. Angiographic images of the head were obtained using MRA technique without contrast. COMPARISON:  MRI brain 2014 FINDINGS: MRI HEAD FINDINGS Brain: There is a small area restricted diffusion within the parasagittal right frontal lobe.  Additional smaller areas restricted diffusion are present in bilateral frontal lobes, left parietal lobe, and bilateral cerebellar hemispheres. There is no evidence of intracranial hemorrhage. Prominence of the ventricles and sulci reflects generalized parenchymal volume loss. Patchy foci of T2 hyperintensity in the supratentorial white matter nonspecific but may reflect mild to moderate chronic microvascular ischemic changes. There is no intracranial mass or mass effect. There is no hydrocephalus or extra-axial fluid collection. Vascular: Major vessel flow voids at the skull base are preserved. Skull and upper cervical spine: Normal marrow signal is preserved. Sinuses/Orbits: Left maxillary sinus mucosal thickening. Orbits are unremarkable. Other: Sella is unremarkable.  Mastoid air cells are clear. MRA HEAD FINDINGS Intracranial internal carotid arteries are patent. Middle and anterior cerebral arteries are patent. Intracranial vertebral arteries, basilar artery, posterior cerebral arteries are patent. Right posterior communicating artery is present with fetal origin of the right PCA. There is no significant stenosis or aneurysm. IMPRESSION: Several small acute infarcts involving multiple vascular territories bilaterally suggesting a central  source. No proximal intracranial vessel occlusion or significant stenosis. Chronic microvascular ischemic changes. Electronically Signed   By: Macy Mis M.D.   On: 01/03/2020 15:20   MR BRAIN WO CONTRAST  Result Date: 01/03/2020 CLINICAL DATA:  Stroke, follow-up EXAM: MRI HEAD WITHOUT CONTRAST MRA HEAD WITHOUT CONTRAST TECHNIQUE: Multiplanar, multiecho pulse sequences of the brain and surrounding structures were obtained without intravenous contrast. Angiographic images of the head were obtained using MRA technique without contrast. COMPARISON:  MRI brain 2014 FINDINGS: MRI HEAD FINDINGS Brain: There is a small area restricted diffusion within the parasagittal right  frontal lobe. Additional smaller areas restricted diffusion are present in bilateral frontal lobes, left parietal lobe, and bilateral cerebellar hemispheres. There is no evidence of intracranial hemorrhage. Prominence of the ventricles and sulci reflects generalized parenchymal volume loss. Patchy foci of T2 hyperintensity in the supratentorial white matter nonspecific but may reflect mild to moderate chronic microvascular ischemic changes. There is no intracranial mass or mass effect. There is no hydrocephalus or extra-axial fluid collection. Vascular: Major vessel flow voids at the skull base are preserved. Skull and upper cervical spine: Normal marrow signal is preserved. Sinuses/Orbits: Left maxillary sinus mucosal thickening. Orbits are unremarkable. Other: Sella is unremarkable.  Mastoid air cells are clear. MRA HEAD FINDINGS Intracranial internal carotid arteries are patent. Middle and anterior cerebral arteries are patent. Intracranial vertebral arteries, basilar artery, posterior cerebral arteries are patent. Right posterior communicating artery is present with fetal origin of the right PCA. There is no significant stenosis or aneurysm. IMPRESSION: Several small acute infarcts involving multiple vascular territories bilaterally suggesting a central source. No proximal intracranial vessel occlusion or significant stenosis. Chronic microvascular ischemic changes. Electronically Signed   By: Macy Mis M.D.   On: 01/03/2020 15:20   ECHOCARDIOGRAM COMPLETE  Result Date: 01/02/2020    ECHOCARDIOGRAM REPORT   Patient Name:   Travis Salinas Date of Exam: 01/02/2020 Medical Rec #:  [id number]          Height:       71.0 in Accession #:    [id number]         Weight:       150.0 lb Date of Birth:  Jan 17, 1951          BSA:          1.866 m Patient Age:    63 years           BP:           112/91 mmHg Patient Gender: M                  HR:           81 bpm. Exam Location:  Inpatient Procedure: 2D Echo, Color  Doppler and Cardiac Doppler                                MODIFIED REPORT:   This report was modified by Candee Furbish MD on 01/02/2020 due to [facility] comment                                     added.  Indications:     Stroke 434.91/II63.9  History:         Patient has prior history of Echocardiogram examinations, most  recent 05/31/2021.  Sonographer:     Merrie Roof RDCS Referring Phys:  [id number] ERIC LINDZEN Diagnosing Phys: Candee Furbish MD IMPRESSIONS  1. Left ventricular ejection fraction, by estimation, is 35 to 40%. The left ventricle has moderately decreased function. The left ventricle demonstrates regional wall motion abnormalities (see scoring diagram/findings for description). There is moderate left ventricular hypertrophy. Left ventricular diastolic parameters are consistent with Grade I diastolic dysfunction (impaired relaxation). Has Takotsubo like appearance (stress induced cardiomyopathy).  2. Right ventricular systolic function is normal. The right ventricular size is normal.  3. The mitral valve is normal in structure. Trivial mitral valve regurgitation. No evidence of mitral stenosis.  4. The aortic valve is normal in structure. Aortic valve regurgitation is not visualized. No aortic stenosis is present.  5. The inferior vena cava is normal in size with greater than 50% respiratory variability, suggesting right atrial pressure of 3 mmHg. Comparison(s): The left ventricular function is worsened. LV function appeared normal in 2018. Conclusion(s)/Recommendation(s): No intracardiac source of embolism detected on this transthoracic study. A transesophageal echocardiogram is recommended to exclude cardiac source of embolism if clinically indicated. FINDINGS  Left Ventricle: Left ventricular ejection fraction, by estimation, is 35 to 40%. The left ventricle has moderately decreased function. The left ventricle demonstrates regional wall motion abnormalities. The left ventricular internal cavity  size was normal in size. There is moderate left ventricular hypertrophy. Left ventricular diastolic parameters are consistent with Grade I diastolic dysfunction (impaired relaxation).  LV Wall Scoring: The entire apex is akinetic. Has Takotsubo like appearance (stress induced cardiomyopathy). Right Ventricle: The right ventricular size is normal. No increase in right ventricular wall thickness. Right ventricular systolic function is normal. Left Atrium: Left atrial size was normal in size. Right Atrium: Right atrial size was normal in size. Pericardium: There is no evidence of pericardial effusion. Mitral Valve: The mitral valve is normal in structure. Normal mobility of the mitral valve leaflets. Trivial mitral valve regurgitation. No evidence of mitral valve stenosis. Tricuspid Valve: The tricuspid valve is normal in structure. Tricuspid valve regurgitation is not demonstrated. No evidence of tricuspid stenosis. Aortic Valve: The aortic valve is normal in structure. Aortic valve regurgitation is not visualized. No aortic stenosis is present. Pulmonic Valve: The pulmonic valve was normal in structure. Pulmonic valve regurgitation is mild. No evidence of pulmonic stenosis. Aorta: The aortic root is normal in size and structure. Venous: The inferior vena cava is normal in size with greater than 50% respiratory variability, suggesting right atrial pressure of 3 mmHg. IAS/Shunts: No atrial level shunt detected by color flow Doppler.  LEFT VENTRICLE PLAX 2D LVIDd:         4.60 cm     Diastology LVIDs:         3.00 cm     LV e' lateral:   5.55 cm/s LV PW:         1.50 cm     LV E/e' lateral: 9.6 LV IVS:        1.30 cm     LV e' medial:    6.64 cm/s LVOT diam:     2.10 cm     LV E/e' medial:  8.0 LV SV:         39 LV SV Index:   21 LVOT Area:     3.46 cm  LV Volumes (MOD) LV vol d, MOD A2C: 69.3 ml LV vol d, MOD A4C: 76.4 ml LV vol s, MOD A2C: 46.7 ml LV vol s, MOD  A4C: 52.7 ml LV SV MOD A2C:     22.6 ml LV SV MOD A4C:      76.4 ml LV SV MOD BP:      23.3 ml RIGHT VENTRICLE             IVC RV Basal diam:  3.10 cm     IVC diam: 1.50 cm RV S prime:     11.90 cm/s TAPSE (M-mode): 2.0 cm LEFT ATRIUM             Index       RIGHT ATRIUM           Index LA diam:        2.70 cm 1.45 cm/m  RA Area:     13.50 cm LA Vol (A2C):   43.4 ml 23.26 ml/m RA Volume:   30.40 ml  16.29 ml/m LA Vol (A4C):   34.8 ml 18.65 ml/m LA Biplane Vol: 40.8 ml 21.87 ml/m  AORTIC VALVE LVOT Vmax:   62.00 cm/s LVOT Vmean:  44.500 cm/s LVOT VTI:    0.112 m  AORTA Ao Root diam: 3.70 cm Ao Asc diam:  3.30 cm MITRAL VALVE MV Area (PHT): 3.54 cm    SHUNTS MV Decel Time: 214 msec    Systemic VTI:  0.11 m MV E velocity: 53.10 cm/s  Systemic Diam: 2.10 cm MV A velocity: 63.80 cm/s MV E/A ratio:  0.83 Candee Furbish MD Electronically signed by Candee Furbish MD Signature Date/Time: 01/02/2020/3:35:22 PM    Final (Updated)    CAROTID (at [facility] and [facility] only)  Result Date: 01/03/2020 Carotid Arterial Duplex Study Indications:       Weakness and New onset of gait instability. Risk Factors:      Hypertension. Other Factors:     Cervical disc herniation. Limitations        Today's exam was limited due to movement, body habitus. Comparison Study:  No prior study on file Performing Technologist: Sharion Dove RVS  Examination Guidelines: A complete evaluation includes B-mode imaging, spectral Doppler, color Doppler, and power Doppler as needed of all accessible portions of each vessel. Bilateral testing is considered an integral part of a complete examination. Limited examinations for reoccurring indications may be performed as noted.  Right Carotid Findings: +----------+--------+--------+--------+------------------+------------------+             PSV cm/s EDV cm/s Stenosis Plaque Description Comments            +----------+--------+--------+--------+------------------+------------------+  CCA Prox   61       8                                    intimal thickening   +----------+--------+--------+--------+------------------+------------------+  CCA Distal 43       9                                    intimal thickening  +----------+--------+--------+--------+------------------+------------------+  ICA Prox   50       13                                                       +----------+--------+--------+--------+------------------+------------------+  ICA Distal 84  27                                                       +----------+--------+--------+--------+------------------+------------------+  ECA        119      17                                                       +----------+--------+--------+--------+------------------+------------------+ +----------+--------+-------+--------+-------------------+             PSV cm/s EDV cms Describe Arm Pressure (mmHG)  +----------+--------+-------+--------+-------------------+  Subclavian 99                                             +----------+--------+-------+--------+-------------------+ +---------+--------+--------+--------------+  Vertebral PSV cm/s EDV cm/s Not identified  +---------+--------+--------+--------------+  Left Carotid Findings: +----------+--------+--------+--------+------------------+------------------+             PSV cm/s EDV cm/s Stenosis Plaque Description Comments            +----------+--------+--------+--------+------------------+------------------+  CCA Prox   54       8                                    intimal thickening  +----------+--------+--------+--------+------------------+------------------+  CCA Distal 50       11                                   intimal thickening  +----------+--------+--------+--------+------------------+------------------+  ICA Prox   32       9                                                        +----------+--------+--------+--------+------------------+------------------+  ICA Distal 45       8                                                         +----------+--------+--------+--------+------------------+------------------+  ECA        74       9                                                        +----------+--------+--------+--------+------------------+------------------+ +----------+--------+--------+--------+-------------------+             PSV cm/s EDV cm/s Describe Arm Pressure (mmHG)  +----------+--------+--------+--------+-------------------+  Subclavian 149                                             +----------+--------+--------+--------+-------------------+ +---------+--------+--+--------+-+  Vertebral PSV cm/s 28 EDV cm/s 8  +---------+--------+--+--------+-+   Summary: Right Carotid: The extracranial vessels were near-normal with only minimal wall                thickening or plaque. Left Carotid: The extracranial vessels were near-normal with only minimal wall               thickening or plaque. Vertebrals:  Left vertebral artery demonstrates antegrade flow. Bilateral              vertebral arteries were not visualized. Subclavians: Normal flow hemodynamics were seen in bilateral subclavian              arteries. *See table(s) above for measurements and observations.  Electronically signed by Antony Contras MD on 01/03/2020 at 1:02:01 PM.    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 Does the need for close, 24 hr/day medical supervision in concert with the patient's rehab needs make it unreasonable for this patient to be served in a less intensive setting? Potentially 2. Co-Morbidities requiring supervision/potential complications: strokes, HTN, hep C, COPD, cocaine abuse, heroin abuse, cocaine cardiomyopathy 3. Due to bowel management, safety, skin/wound care, disease management, medication administration, pain management and patient education, does the patient require 24 hr/day rehab nursing? Potentially 4. Does the patient require coordinated care of a physician, rehab nurse, therapy  disciplines of PT, OT and SLP  to address physical and functional deficits in the context of the above medical diagnosis(es)? Potentially Addressing deficits in the following areas: balance, endurance, locomotion, transferring, bathing, dressing, grooming, toileting and cognition 5. Can the patient actively participate in an intensive therapy program of at least 3 hrs of therapy per day at least 5 days per week? Potentially 6. The potential for patient to make measurable gains while on inpatient rehab is good and fair 7. Anticipated functional outcomes upon discharge from inpatient rehab are modified independent, supervision and min assist  with PT, modified independent, supervision and min assist with OT, supervision and min assist with SLP. 8. Estimated rehab length of stay to reach the above functional goals is: not clear- not sure about dispo?? 9. Anticipated discharge destination: Home 10. Overall Rehab/Functional Prognosis: good and fair  RECOMMENDATIONS: This patient's condition is appropriate for continued rehabilitative care in the following setting: CIR and SNF Patient has agreed to participate in recommended program. Potentially Note that insurance prior authorization may be required for reimbursement for recommended care.  Comment:  1. Pt's focus is on receiving pain meds- he sees nothing wrong since stroke, just trying to get more meds as stated above.  2. Not sure about dispo- has a friend, but wife and he are separated/divorced- need to check into this.  3. High level, based on exam 4. Will d/w admissions coordinators 5. Thank you for this consult.    Lavon Paganini Angiulli, PA-C 01/04/2020   I have personally performed a face to face diagnostic evaluation of this patient and formulated the key components of the plan.  Additionally, I have personally reviewed laboratory data, imaging studies, as well as relevant notes and concur with the physician assistant's documentation above.

## 2020-01-04 NOTE — Progress Notes (Signed)
Spoke with [facility] RN via secure chat and she is aware of the DC midline order

## 2020-01-04 NOTE — Progress Notes (Signed)
STROKE TEAM PROGRESS NOTE   INTERVAL HISTORY Patient is doing well.  Is sitting in a bedside chair.  He ambulated with therapy earlier.  Inpatient rehab has been recommended.  Appreciate cardiology help.  Vital signs stable  OBJECTIVE Vitals:   01/04/20 1100 01/04/20 1140 01/04/20 1200 01/04/20 1300  BP:  106/63 106/63   Pulse: 81 79 86 84  Resp:  14 16   Temp:  98.2 F (36.8 C) 98.2 F (36.8 C)   TempSrc:  Oral Oral   SpO2: 98% 97% 95% 95%  Weight:      Height:        CBC:  Recent Labs  Lab 01/01/20 2210 01/01/20 2210 01/01/20 2232 01/02/20 1934  WBC 14.8*  --   --  13.5*  NEUTROABS 9.5*  --   --   --   HGB 16.7   < > 18.4* 13.8  HCT 54.5*   < > 54.0* 44.4  MCV 87.8  --   --  86.5  PLT 284  --   --  284   < > = values in this interval not displayed.    Basic Metabolic Panel:  Recent Labs  Lab 01/01/20 2210 01/01/20 2210 01/01/20 2232 01/02/20 1934  NA 137   < > 139 140  K 4.1   < > 3.8 3.6  CL 101   < > 105 107  CO2 20*  --   --  20*  GLUCOSE 99   < > 94 124*  BUN 41*   < > 42* 29*  CREATININE 1.44*   < > 1.50* 1.31*  CALCIUM 10.3  --   --  9.1  MG  --   --   --  1.9   < > = values in this interval not displayed.    Lipid Panel:     Component Value Date/Time   CHOL 151 01/02/2020 0329   TRIG 97 01/02/2020 0329   HDL 45 01/02/2020 0329   CHOLHDL 3.4 01/02/2020 0329   VLDL 19 01/02/2020 0329   LDLCALC 87 01/02/2020 0329   HgbA1c:  Lab Results  Component Value Date   HGBA1C 5.2 01/02/2020   Urine Drug Screen:     Component Value Date/Time   LABOPIA POSITIVE (A) 01/02/2020 0544   COCAINSCRNUR POSITIVE (A) 01/02/2020 0544   LABBENZ POSITIVE (A) 01/02/2020 0544   AMPHETMU NONE DETECTED 01/02/2020 0544   THCU NONE DETECTED 01/02/2020 0544   LABBARB NONE DETECTED 01/02/2020 0544    Alcohol Level     Component Value Date/Time   [facility]  05/10/2010 1230    <5        LOWEST DETECTABLE LIMIT FOR SERUM ALCOHOL IS 5 mg/dL FOR MEDICAL PURPOSES ONLY     IMAGING past 24h No results found.   PHYSICAL EXAM    Frail middle-age Caucasian male not in distress. NAD, poor grooming, appears older than stated age           . Afebrile. Head is nontraumatic. Neck is supple without bruit.    Cardiac exam no murmur or gallop. Lungs are clear to auscultation. Distal pulses are well felt.    Neurological exam :  Awake alert oriented to time place and person  speech:    Speech is normal; fluent and spontaneous with normal comprehension. Attention normal.  Cognition:    The patient is oriented to person, place, and time;     recent and remote memory intact;     language fluent;  Cranial Nerves:    The pupils are equal, round, and reactive to light.Trigeminal sensation is intact and the muscles of mastication are normal. The face is symmetric. The palate elevates in the midline. Hearing intact. Voice is normal. Shoulder shrug is normal. The tongue has normal motion without fasciculations. Voice and hearing intact.  Coordination:  Ataxia in the lower extremities Motor Observation:    No asymmetry, no atrophy, and no involuntary movements noted. Tone:    Normal muscle tone.   Strength:    Mild left hemiparesis 4/5 strength with weakness of left grip and intrinsic hand muscles.  Orbits right over left upper extremity.  Fine finger movements are diminished on the left compared to the right.  Normal strength on the right.    Sensation: intact to LT Gait: Deferred   ASSESSMENT/PLAN [name] is a 69 y.o. male with history of Htn, hepatitis C, Cervical disc herniation, and COPD presenting with gait instability, several falls, left sided numbness and bilateral LE weakness with ataxia. The patient received IV t-PA Saturday 01/01/20 at 22:15.    Stroke: Bicerebral embolic strokes likely due to cocaine cardiomyopathy s/p IV tPA  Code Stroke CT Head - No acute head CT finding. Chronic small-vessel changes of the white matter. ASPECTS is 10    CT CS no acute abnormality. Moderate to severe degenerative changes C5-6, C6-7. Moderate severe emphysema.   CT face L maxillary sinus dz  CT head post 24h no acute abnormality Small vessel disease. Atrophy.   MRI head -small punctate bicerebral embolic infarcts involving bilateral ACA, left MCA and bilateral cerebellum   MRA head -no large vessel stenosis or occlusion carotid Doppler - B ICA 1-39% stenosis, VAs antegrade    2D Echo - EF 35-40%. No source of embolus   [employer] Virus 2 - neg, serology positive  LDL - 87  HgbA1c - 5.9  UDS - + opiates, cocaine and benzos  Troponin -elevated at 585 NG/mL   VTE prophylaxis - SCDs  No antithrombotic prior to admission, now on aspirin 81 mg daily and clopidogrel 75 mg daily. Continue DAPT x 3 weeks then aspirin alone.     Therapy recommendations:  CIR  Disposition:  Pending  D/c midline  Hypertension  Home BP meds: tenormin 25 bid  Current BP meds: coreg 3.125 bid  Stable . SBP goal now < 180 mm Hg  . Long-term BP goal normotensive  Hyperlipidemia  Home Lipid lowering medication: none   LDL 87, goal < 70  Now on lipitor 40  Continue statin at discharge  Other Stroke Risk Factors  Advanced age  Former cigarette smoker - quit  Family hx stroke (mother and father)  Substance Abuse - heroin and cocaine. UDS +benzos, opiates and cocaine. Pt advised to stop using d/t stroke risk. SW addressed as well and pt refused cessation resources.  Acute on Chronic systolic and diastolic CHF secondary to cocaine use, UDS+ on admission, EF 35-40%. Takotsubo-like pattern c/w coronary spasm. Continue coreg per cards. Add ARB if BP tolerates. Cardiology on board.  Other Active Problems  Code status - Full code  COPD / Emphysema [id number])  Marked severity left maxillary sinus mucosal thickening  Moderate to marked severity multilevel degenerative changes, most prominent at the levels of C5-C6 and  C6-C7  Leukocytosis - 14.8 (afebrile)  CKD stage 3a - creatinine - 1.44->1.50  Constipation     Wide complex tachycardia d/t NSR w/ underlying BBB. Treated w/ IV amiodarone. Transitioned  to oral. Cardiology on obard. F/u w/ Dr. Marlou Porch as OP.  Elevated troponin c/w demand ischemia from Takotsubo syndrome. Doubt acute coronary syndrome. Cath not needed. Cardiology on board.   Hospital day # 3 Continue mobilize out of bed.  Therapy consults.  Appreciate cardiology help.  Transfer to inpatient rehab when bed available.  Patient again counseled to quit cocaine and is agreeable.  Discussed with Dr. Acie Fredrickson from cardiology  I have spent a total of  25  minutes with the patient reviewing hospital notes,  test results, labs and examining the patient as well as establishing an assessment and plan that was discussed personally with the patient.  > 50% of time was spent in direct patient care.        Antony Contras, MD  To contact Stroke Continuity provider, please refer to [email or web address] After hours, contact General Neurology

## 2020-01-04 NOTE — Care Management Important Message (Signed)
Important Message  Patient Details  Name: Travis Salinas MRN: [id number] Date of Birth: [birth year]   Medicare Important Message Given:  Yes     Spenser Cong Montine Circle 01/04/2020, 11:45 AM

## 2020-01-05 DIAGNOSIS — I5043 Acute on chronic combined systolic (congestive) and diastolic (congestive) heart failure: Secondary | ICD-10-CM | POA: Diagnosis not present

## 2020-01-05 NOTE — Progress Notes (Signed)
Physical Therapy Treatment Patient Details Name: Travis Salinas MRN: [id number] DOB: 14-Mar-1951 Today's Date: 01/05/2020    History of Present Illness Pt is a 69 year old man admitted with falls and L side weakness. Admininstered tPA. CT scan negative for acute changes, MRI results pending. Hospital course complicated by tachycardia. Cardioverted 4/25.  Pt PMH: COPD, CHF, arthritis, HepC, HTN, cocaine abuse.     PT Comments    Patient is making progress toward PT goals. Pt continues to demonstrate decreased activity tolerance, SOB with ambulation, and balance deficits requiring min guard-min A for OOB mobility. Pt would likely be at mod I-Independent level with CIR level therapies.       Follow Up Recommendations  CIR;Supervision/Assistance - 24 hour     Equipment Recommendations  Other (comment)(TBD pending progress; likely RW if d/c home)    Recommendations for Other Services       Precautions / Restrictions Precautions Precautions: Fall Restrictions Weight Bearing Restrictions: No    Mobility  Bed Mobility                  Transfers Overall transfer level: Needs assistance Equipment used: None Transfers: Sit to/from Stand Sit to Stand: Min guard         General transfer comment: min guard for safety  Ambulation/Gait Ambulation/Gait assistance: Min assist;Min guard Gait Distance (Feet): (120 ft X 2 trials with seated rest break due to SOB ) Assistive device: None(assist at trunk with gait belt) Gait Pattern/deviations: Step-through pattern;Decreased stride length;Drifts right/left Gait velocity: varying speeds   General Gait Details: min guard assist if pt on straight path with guarded movements; min A required for balance with challenges (direction changes, horizontal head turns, turning); cues for safe gait speed   Stairs Stairs: Yes Stairs assistance: Min guard Stair Management: Two rails;Step to pattern;Forwards Number of Stairs: (2 steps X 2  trials) General stair comments: min guard for safety   Wheelchair Mobility    Modified Rankin (Stroke Patients Only) Modified Rankin (Stroke Patients Only) Pre-Morbid Rankin Score: No symptoms Modified Rankin: Moderately severe disability     Balance Overall balance assessment: Needs assistance Sitting-balance support: Feet supported Sitting balance-Leahy Scale: Poor     Standing balance support: During functional activity Standing balance-Leahy Scale: Fair                              Cognition Arousal/Alertness: Awake/alert Behavior During Therapy: WFL for tasks assessed/performed Overall Cognitive Status: Within Functional Limits for tasks assessed Area of Impairment: Safety/judgement                         Safety/Judgement: Decreased awareness of safety;Decreased awareness of deficits            Exercises      General Comments        Pertinent Vitals/Pain Pain Assessment: No/denies pain    Home Living                      Prior Function            PT Goals (current goals can now be found in the care plan section) Progress towards PT goals: Progressing toward goals    Frequency    Min 3X/week      PT Plan Current plan remains appropriate    Co-evaluation  AM-PAC PT "6 Clicks" Mobility   Outcome Measure  Help needed turning from your back to your side while in a flat bed without using bedrails?: None Help needed moving from lying on your back to sitting on the side of a flat bed without using bedrails?: None Help needed moving to and from a bed to a chair (including a wheelchair)?: None Help needed standing up from a chair using your arms (e.g., wheelchair or bedside chair)?: A Little Help needed to walk in hospital room?: A Little Help needed climbing 3-5 steps with a railing? : A Little 6 Click Score: 21    End of Session Equipment Utilized During Treatment: Gait belt Activity  Tolerance: Patient tolerated treatment well Patient left: with call bell/phone within reach;in chair;with chair alarm set Nurse Communication: Mobility status PT Visit Diagnosis: Unsteadiness on feet (R26.81);Other abnormalities of gait and mobility (R26.89);Other symptoms and signs involving the nervous system (R29.898)     Time: [id number] PT Time Calculation (min) (ACUTE ONLY): 19 min  Charges:  $Gait Training: 8-22 mins                     Earney Navy, PTA Acute Rehabilitation Services Pager: [phone] Office: [phone]     Darliss Cheney 01/05/2020, 2:44 PM

## 2020-01-05 NOTE — Progress Notes (Addendum)
STROKE TEAM PROGRESS NOTE   INTERVAL HISTORY Patient is sitting up in bed and is asking: "So what happened here? Did I have a stroke or a heart attack?". I explained that he has stroke and had episode of wide-complex tachycardia requiring cardioversion which was unsuccessful and was started on amiodarone and carvedilol. D/w patient that this is most likely d/t his ongoing cocaine use. Cardiology is following  OBJECTIVE Vitals:   01/04/20 2326 01/05/20 0421 01/05/20 0642 01/05/20 0819  BP: 118/73 112/63  124/84  Pulse: 76 76  85  Resp: 16 16  15   Temp: 98.4 F (36.9 C) 98.2 F (36.8 C)  97.8 F (36.6 C)  TempSrc: Oral Oral  Oral  SpO2: 96% 96% 99% 97%  Weight:      Height:        CBC:  Recent Labs  Lab 01/01/20 2210 01/01/20 2210 01/01/20 2232 01/02/20 1934  WBC 14.8*  --   --  13.5*  NEUTROABS 9.5*  --   --   --   HGB 16.7   < > 18.4* 13.8  HCT 54.5*   < > 54.0* 44.4  MCV 87.8  --   --  86.5  PLT 284  --   --  284   < > = values in this interval not displayed.    Basic Metabolic Panel:  Recent Labs  Lab 01/01/20 2210 01/01/20 2210 01/01/20 2232 01/02/20 1934  NA 137   < > 139 140  K 4.1   < > 3.8 3.6  CL 101   < > 105 107  CO2 20*  --   --  20*  GLUCOSE 99   < > 94 124*  BUN 41*   < > 42* 29*  CREATININE 1.44*   < > 1.50* 1.31*  CALCIUM 10.3  --   --  9.1  MG  --   --   --  1.9   < > = values in this interval not displayed.    Lipid Panel:     Component Value Date/Time   CHOL 151 01/02/2020 0329   TRIG 97 01/02/2020 0329   HDL 45 01/02/2020 0329   CHOLHDL 3.4 01/02/2020 0329   VLDL 19 01/02/2020 0329   LDLCALC 87 01/02/2020 0329   HgbA1c:  Lab Results  Component Value Date   HGBA1C 5.2 01/02/2020   Urine Drug Screen:     Component Value Date/Time   LABOPIA POSITIVE (A) 01/02/2020 0544   COCAINSCRNUR POSITIVE (A) 01/02/2020 0544   LABBENZ POSITIVE (A) 01/02/2020 0544   AMPHETMU NONE DETECTED 01/02/2020 0544   THCU NONE DETECTED 01/02/2020  0544   LABBARB NONE DETECTED 01/02/2020 0544    Alcohol Level     Component Value Date/Time   [facility]  05/10/2010 1230    <5        LOWEST DETECTABLE LIMIT FOR SERUM ALCOHOL IS 5 mg/dL FOR MEDICAL PURPOSES ONLY    IMAGING past 24h No results found.   PHYSICAL EXAM  Frail middle-age Caucasian male not in distress. NAD, poor grooming, appears older than stated age           . Afebrile. Head is nontraumatic. Neck is supple without bruit.    Cardiac exam no murmur or gallop. Lungs are clear to auscultation. Distal pulses are well felt.    Neurological exam :  Awake alert oriented to time place and person. Speech is normal; fluent and spontaneous with normal comprehension. Attention normal.  Cranial Nerves:  pupils are equal, round, and reactive to light.The face is symmetric. The palate elevates in the midline. Hearing intact. Voice is normal. Shoulder shrug is normal. The tongue is midline and has normal motion. Voice and hearing intact.  Coordination: Ataxia in the lower extremities Motor: No asymmetry, no atrophy, and no involuntary movements noted. Normal muscle tone. Mild left hemiparesis 4/5 strength with weakness of left grip and intrinsic hand muscles.  Orbits right over left upper extremity.  Fine finger movements are diminished on the left compared to the right. Normal strength on the right.    Sensation: intact to LT Gait: Deferred  ASSESSMENT/PLAN [name] is a 69 y.o. male with history of Htn, hepatitis C, Cervical disc herniation, and COPD presenting with gait instability, several falls, left sided numbness and bilateral LE weakness with ataxia. The patient received IV t-PA Saturday 01/01/20 at 22:15. +Cocaine in UDS.   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 MRI pending  Code Stroke CT Head - No acute head CT finding. Chronic small-vessel changes of the white matter. ASPECTS is 10   CT CS no acute abnormality. Moderate  to severe degenerative changes C5-6, C6-7. Moderate severe emphysema.   CT face L maxillary sinus dz  CT head post 24h no acute abnormality Small vessel disease. Atrophy.   MRI head -small punctate bicerebral embolic infarcts involving bilateral ACA, left MCA and bilateral cerebellum   MRA head -no large vessel stenosis or occlusion carotid Doppler - B ICA 1-39% stenosis, VAs antegrade    2D Echo - EF 35-40%. No source of embolus   [employer] Virus 2 - neg, serology positive  LDL - 87  HgbA1c - 5.9  UDS - + opiates, cocaine and benzos  HIV - pending  Troponin -elevated at 585 NG/mL   VTE prophylaxis - SCDs  No antithrombotic prior to admission, now on No antithrombotic. Add ASA 81 mg daily.   Therapy recommendations: Inpatient Rehab  Disposition:  Pending  Ok to be OOB  Transfer to the f loor  Hypertension  Home BP meds: tenormin 25 bid  Current BP meds: coreg 3.125 bid  Stable . SBP goal now < 180 mm Hg  . Long-term BP goal normotensive  Hyperlipidemia  Home Lipid lowering medication: none   LDL 87, goal < 70  Now on lipitor 40  Continue statin at discharge  Other Stroke Risk Factors  Advanced age  Former cigarette smoker - quit  Family hx stroke (mother and father)  Substance Abuse - heroin and cocaine. UDS +benzos, opiates and cocaine. Pt advised to stop using d/t stroke risk. SW addressed as well and pt refused cessation resources.  Acute on Chronic systolic and diastolic CHF secondary to cocaine use, UDS+ on admission, EF 35-40%. Takotsubo-like pattern c/w coronary spasm. Continue coreg. Add ARB if BP tolerates. Cardiology on board.  Other Active Problems  Code status - Full code  COPD / Emphysema [id number])  Marked severity left maxillary sinus mucosal thickening  Moderate to marked severity multilevel degenerative changes, most prominent at the levels of C5-C6 and C6-C7  Leukocytosis - 14.8 (afebrile)  CKD stage 3a -  creatinine - 1.44->1.50  Wide complex tachycardia d/t NSR w/ underlying BBB. Treated w/ IV amiodarone. Transitioned to oral. Cardiology on obard. F/u w/ Dr. Marlou Porch as OP.  Elevated troponin c/w demand ischemia from Takotsubo syndrome. Doubt acute coronary syndrome. Cath not needed. Cardiology on board.   Hospital day # 4  Desiree Metzger-Cihelka, ARNP-C, ANVP-BC Pager: [phone] I have personally obtained history,examined this patient, reviewed notes, independently viewed imaging studies, participated in medical decision making and plan of care.ROS completed by me personally and pertinent positives fully documented  I have made any additions or clarifications directly to the above note. Agree with note above.   Antony Contras, MD Medical Director [facility] Pager: [phone] 01/05/2020 2:49 PM  To contact Stroke Continuity provider, please refer to [email or web address] After hours, contact General Neurology

## 2020-01-05 NOTE — Progress Notes (Signed)
Inpatient Rehab Admissions:  Inpatient Rehab Consult received.  I met with pt at the bedside for rehabilitation assessment and to discuss goals and expectations of an inpatient rehab admission. Pt aware of need for rehab at this time and would like to pursue this program if insurance approves. With his permission, I spoke to his wife who has confirmed DC support and plans to allow the patient to return to her house for a few weeks for 24/7 supervision prior to returning to his apartment with his roommate. Anticipate pt can reach supervision level in a short period of time on CIR after speaking with PTA today about his progress. I will begin insurance authorization process for possible admit. Will follow up once there has been a determination.   Raechel Ache, OTR/L  Rehab Admissions Coordinator  [phone] 01/05/2020 2:28 PM

## 2020-01-05 NOTE — Progress Notes (Signed)
Progress Note  Patient Name: Travis Salinas Date of Encounter: 01/05/2020  Primary Cardiologist: Dr. Marlou Porch   Subjective   Feeling well today. Complains of neck pain . Working with PT.  We discussed his cardiomyopathy He has had + USD for cocaine dating back to 2011.    All of his UDS have been positive for cocaine.   Inpatient Medications    Scheduled Meds: .  stroke: mapping our early stages of recovery book   Does not apply Once  . amantadine  50 mg Oral BID  . amiodarone  200 mg Oral BID  . ARIPiprazole  10 mg Oral Daily  . aspirin EC  81 mg Oral Daily  . atorvastatin  40 mg Oral Daily  . benzonatate  200 mg Oral TID  . carvedilol  3.125 mg Oral BID WC  . Chlorhexidine Gluconate Cloth  6 each Topical Daily  . clonazePAM  1 mg Oral BID  . clopidogrel  75 mg Oral Daily  . FLUoxetine  10 mg Oral Daily  . lamoTRIgine  200 mg Oral q morning - 10a  . pantoprazole  40 mg Oral QHS  . sodium chloride flush  10-40 mL Intracatheter Q12H  . tamsulosin  0.4 mg Oral Daily   Continuous Infusions: . sodium chloride Stopped (01/05/20 0757)   PRN Meds: acetaminophen **OR** acetaminophen (TYLENOL) oral liquid 160 mg/5 mL **OR** acetaminophen, albuterol, ipratropium-albuterol, oxyCODONE, senna-docusate, sodium chloride flush   Vital Signs    Vitals:   01/04/20 2326 01/05/20 0421 01/05/20 0642 01/05/20 0819  BP: 118/73 112/63  124/84  Pulse: 76 76  85  Resp: 16 16  15   Temp: 98.4 F (36.9 C) 98.2 F (36.8 C)  97.8 F (36.6 C)  TempSrc: Oral Oral  Oral  SpO2: 96% 96% 99% 97%  Weight:      Height:        Intake/Output Summary (Last 24 hours) at 01/05/2020 0938 Last data filed at 01/05/2020 0400 Gross per 24 hour  Intake --  Output 1150 ml  Net -1150 ml   Filed Weights   01/01/20 2106  Weight: 68 kg    Physical Exam  Physical Exam: Blood pressure 124/84, pulse 85, temperature 97.8 F (36.6 C), temperature source Oral, resp. rate 15, height 5\' 11"  (1.803 m),  weight 68 kg, SpO2 97 %.  GEN:   mmiddle age male,  Appears older than his stated age. HEENT: Normal NECK: No JVD; No carotid bruits LYMPHATICS: No lymphadenopathy CARDIAC: RRR urs, rubs, gallops RESPIRATORY:  Clear to auscultation without rales, wheezing or rhonchi  ABDOMEN: Soft, non-tender, non-distended MUSCULOSKELETAL:  No edema; No deformity  SKIN: Warm and dry NEUROLOGIC:  Alert and oriented x 3   Labs    Chemistry Recent Labs  Lab 01/01/20 2210 01/01/20 2232 01/02/20 1934  NA 137 139 140  K 4.1 3.8 3.6  CL 101 105 107  CO2 20*  --  20*  GLUCOSE 99 94 124*  BUN 41* 42* 29*  CREATININE 1.44* 1.50* 1.31*  CALCIUM 10.3  --  9.1  PROT 7.3  --   --   ALBUMIN 4.4  --   --   AST 58*  --   --   ALT 36  --   --   ALKPHOS 106  --   --   BILITOT 1.3*  --   --   GFRNONAA 50*  --  56*  GFRAA 58*  --  >60  ANIONGAP  16*  --  36     Hematology Recent Labs  Lab 01/01/20 2210 01/01/20 2232 01/02/20 1934  WBC 14.8*  --  13.5*  RBC 6.21*  --  5.13  HGB 16.7 18.4* 13.8  HCT 54.5* 54.0* 44.4  MCV 87.8  --  86.5  MCH 26.9  --  26.9  MCHC 30.6  --  31.1  RDW 13.4  --  13.2  PLT 284  --  284    Cardiac EnzymesNo results for input(s): TROPONINI in the last 168 hours. No results for input(s): TROPIPOC in the last 168 hours.   BNPNo results for input(s): BNP, PROBNP in the last 168 hours.   DDimer No results for input(s): DDIMER in the last 168 hours.   Radiology    MR ANGIO HEAD WO CONTRAST  Result Date: 01/03/2020 CLINICAL DATA:  Stroke, follow-up EXAM: MRI HEAD WITHOUT CONTRAST MRA HEAD WITHOUT CONTRAST TECHNIQUE: Multiplanar, multiecho pulse sequences of the brain and surrounding structures were obtained without intravenous contrast. Angiographic images of the head were obtained using MRA technique without contrast. COMPARISON:  MRI brain 2014 FINDINGS: MRI HEAD FINDINGS Brain: There is a small area restricted diffusion within the parasagittal right frontal lobe.  Additional smaller areas restricted diffusion are present in bilateral frontal lobes, left parietal lobe, and bilateral cerebellar hemispheres. There is no evidence of intracranial hemorrhage. Prominence of the ventricles and sulci reflects generalized parenchymal volume loss. Patchy foci of T2 hyperintensity in the supratentorial white matter nonspecific but may reflect mild to moderate chronic microvascular ischemic changes. There is no intracranial mass or mass effect. There is no hydrocephalus or extra-axial fluid collection. Vascular: Major vessel flow voids at the skull base are preserved. Skull and upper cervical spine: Normal marrow signal is preserved. Sinuses/Orbits: Left maxillary sinus mucosal thickening. Orbits are unremarkable. Other: Sella is unremarkable.  Mastoid air cells are clear. MRA HEAD FINDINGS Intracranial internal carotid arteries are patent. Middle and anterior cerebral arteries are patent. Intracranial vertebral arteries, basilar artery, posterior cerebral arteries are patent. Right posterior communicating artery is present with fetal origin of the right PCA. There is no significant stenosis or aneurysm. IMPRESSION: Several small acute infarcts involving multiple vascular territories bilaterally suggesting a central source. No proximal intracranial vessel occlusion or significant stenosis. Chronic microvascular ischemic changes. Electronically Signed   By: Macy Mis M.D.   On: 01/03/2020 15:20   MR BRAIN WO CONTRAST  Result Date: 01/03/2020 CLINICAL DATA:  Stroke, follow-up EXAM: MRI HEAD WITHOUT CONTRAST MRA HEAD WITHOUT CONTRAST TECHNIQUE: Multiplanar, multiecho pulse sequences of the brain and surrounding structures were obtained without intravenous contrast. Angiographic images of the head were obtained using MRA technique without contrast. COMPARISON:  MRI brain 2014 FINDINGS: MRI HEAD FINDINGS Brain: There is a small area restricted diffusion within the parasagittal right  frontal lobe. Additional smaller areas restricted diffusion are present in bilateral frontal lobes, left parietal lobe, and bilateral cerebellar hemispheres. There is no evidence of intracranial hemorrhage. Prominence of the ventricles and sulci reflects generalized parenchymal volume loss. Patchy foci of T2 hyperintensity in the supratentorial white matter nonspecific but may reflect mild to moderate chronic microvascular ischemic changes. There is no intracranial mass or mass effect. There is no hydrocephalus or extra-axial fluid collection. Vascular: Major vessel flow voids at the skull base are preserved. Skull and upper cervical spine: Normal marrow signal is preserved. Sinuses/Orbits: Left maxillary sinus mucosal thickening. Orbits are unremarkable. Other: Sella is unremarkable.  Mastoid air cells are clear. MRA HEAD  FINDINGS Intracranial internal carotid arteries are patent. Middle and anterior cerebral arteries are patent. Intracranial vertebral arteries, basilar artery, posterior cerebral arteries are patent. Right posterior communicating artery is present with fetal origin of the right PCA. There is no significant stenosis or aneurysm. IMPRESSION: Several small acute infarcts involving multiple vascular territories bilaterally suggesting a central source. No proximal intracranial vessel occlusion or significant stenosis. Chronic microvascular ischemic changes. Electronically Signed   By: Macy Mis M.D.   On: 01/03/2020 15:20   CAROTID (at [facility] and [facility] only)  Result Date: 01/03/2020 Carotid Arterial Duplex Study Indications:       Weakness and New onset of gait instability. Risk Factors:      Hypertension. Other Factors:     Cervical disc herniation. Limitations        Today's exam was limited due to movement, body habitus. Comparison Study:  No prior study on file Performing Technologist: Sharion Dove RVS  Examination Guidelines: A complete evaluation includes B-mode imaging, spectral Doppler,  color Doppler, and power Doppler as needed of all accessible portions of each vessel. Bilateral testing is considered an integral part of a complete examination. Limited examinations for reoccurring indications may be performed as noted.  Right Carotid Findings: +----------+--------+--------+--------+------------------+------------------+           PSV cm/sEDV cm/sStenosisPlaque DescriptionComments           +----------+--------+--------+--------+------------------+------------------+ CCA Prox  61      8                                 intimal thickening +----------+--------+--------+--------+------------------+------------------+ CCA Distal43      9                                 intimal thickening +----------+--------+--------+--------+------------------+------------------+ ICA Prox  50      13                                                   +----------+--------+--------+--------+------------------+------------------+ ICA Distal84      27                                                   +----------+--------+--------+--------+------------------+------------------+ ECA       119     17                                                   +----------+--------+--------+--------+------------------+------------------+ +----------+--------+-------+--------+-------------------+           PSV cm/sEDV cmsDescribeArm Pressure (mmHG) +----------+--------+-------+--------+-------------------+ [id number]                                         +----------+--------+-------+--------+-------------------+ +---------+--------+--------+--------------+ VertebralPSV cm/sEDV cm/sNot identified +---------+--------+--------+--------------+  Left Carotid Findings: +----------+--------+--------+--------+------------------+------------------+           PSV cm/sEDV cm/sStenosisPlaque DescriptionComments            +----------+--------+--------+--------+------------------+------------------+  CCA Prox  54      8                                 intimal thickening +----------+--------+--------+--------+------------------+------------------+ CCA Distal50      11                                intimal thickening +----------+--------+--------+--------+------------------+------------------+ ICA Prox  32      9                                                    +----------+--------+--------+--------+------------------+------------------+ ICA Distal45      8                                                    +----------+--------+--------+--------+------------------+------------------+ ECA       74      9                                                    +----------+--------+--------+--------+------------------+------------------+ +----------+--------+--------+--------+-------------------+           PSV cm/sEDV cm/sDescribeArm Pressure (mmHG) +----------+--------+--------+--------+-------------------+ [id number]                                         +----------+--------+--------+--------+-------------------+ +---------+--------+--+--------+-+ VertebralPSV cm/s28EDV cm/s8 +---------+--------+--+--------+-+   Summary: Right Carotid: The extracranial vessels were near-normal with only minimal wall                thickening or plaque. Left Carotid: The extracranial vessels were near-normal with only minimal wall               thickening or plaque. Vertebrals:  Left vertebral artery demonstrates antegrade flow. Bilateral              vertebral arteries were not visualized. Subclavians: Normal flow hemodynamics were seen in bilateral subclavian              arteries. *See table(s) above for measurements and observations.  Electronically signed by Antony Contras MD on 01/03/2020 at 1:02:01 PM.    Final    Telemetry    01/04/20 NSR with infrequent PVCs - Personally Reviewed  ECG      No new tracing as of 01/04/20- Personally Reviewed  Cardiac Studies   Echocardiogram 01/02/20:  1. Left ventricular ejection fraction, by estimation, is 35 to 40%. The  left ventricle has moderately decreased function. The left ventricle  demonstrates regional wall motion abnormalities (see scoring  diagram/findings for description). There is  moderate left ventricular hypertrophy. Left ventricular diastolic  parameters are consistent with Grade I diastolic dysfunction (impaired  relaxation). Has Takotsubo like appearance (stress induced  cardiomyopathy).  2. Right ventricular systolic function is normal. The right ventricular  size is normal.  3. The mitral valve is normal in structure. Trivial mitral  valve  regurgitation. No evidence of mitral stenosis.  4. The aortic valve is normal in structure. Aortic valve regurgitation is  not visualized. No aortic stenosis is present.  5. The inferior vena cava is normal in size with greater than 50%  respiratory variability, suggesting right atrial pressure of 3 mmHg.   Patient Profile     69 y.o. male with a history of COPD (emphysema), hepatitis C, HTN, CKD IIIa, and polysubstance use (heroin and cocaine) who presented yesterday with acute onset gait instability and neurologic deficits most consistent with acute stroke and is now s/p tPA. He is being seen today for the evaluation of wide complex tachycardia  Assessment & Plan     1. Acute on chronic combined systolic and diastolic congestive heart failure:  His most recent echocardiogram reveals an EF of 35 to 40%.  This is decreased from his previous echocardiogram in September, 2018 which showed a normal left ventricular systolic function.  The contractility pattern of the most recent echo shows a similarity to Takotsubo syndrome so hopefully this is a reversible cardiomyopathy due to diffuse coronary spasm.  He says he is committed to stopping using cocaine. Continue carvedilol for  now.  I would add ARB if his blood pressure increases but at present his blood pressure is too low to consider adding ARB.  We will follow up with Dr. Marlou Porch in the office following discharge.   2.  Cocaine use:  -Cessation encouraged  3.  Wide-complex tachycardia: Better on amiodarone. Will reduce amio to 200 mg a day  I anticipate stopping amio in about 3 months ( assuming no further episodes of wide complex tachycardia   4.  Elevated troponin levels:  -639>>585>>felt to be consistent with demand ischemia and Takotsubo syndrome. -No current plans for ischemic evaluation   [facility] will sign off.   Medication Recommendations:    Other recommendations (labs, testing, etc):   Follow up as an outpatient:   With Dr. Marlou Porch or APP a month or so following DC from [place], [place]  01/05/2020 9:43 [place] [address],  [place], [place]  [address] Phone: [phone]; Fax: [phone]

## 2020-01-05 NOTE — Progress Notes (Signed)
Patient requested breathing treatment for sob after attempting to have a BM.

## 2020-01-05 NOTE — PMR Pre-admission (Signed)
PMR Admission Coordinator Pre-Admission Assessment  Patient: Travis Salinas is an 69 y.o., male MRN: [id number] DOB: 1950/09/26 Height: 5' 11" (180.3 cm) Weight: 68 kg              Insurance Information HMO:     PPO: yes     PCP:      IPA:      80/20:      OTHER:  PRIMARY: [facility]      Policy#: [id number]      Subscriber: patient CM Name: Travis Salinas      Phone#: [phone]     Fax#: [phone] Pre-Cert#: [id number]      Employer:  Auth of [id number] provided by Travis Salinas for admit to CIR. Pt is approved for 5 days. HTA has epic access so no need to fax.  Benefits:  Phone #: [phone]     Name: option 1, spoke with Sherrie Eff. Date: 09/09/2016-09/08/20     Deduct: no deductible ($0)      Out of Pocket Max: $3,400 ($30 met)      Life Max: NA  CIR: $295/day co-pay for days 1-6, $0/day for days 7-90      SNF: $50/day co-pay for days 1-20, $178/day co-pay for days 21-100; limited to 100 days/benefit period Outpatient: $15/visist co-pay; limited by medical necessity Home Health: 100% coverage, 0% co-insurance, $0 co-pay; limited by medical necessity DME: 80% coverage; 20% co-insurance Providers:  SECONDARY: None      Policy#:       Phone#:   Development worker, community:       Phone#:   The Engineer, petroleum" for patients in Inpatient Rehabilitation Facilities with attached "Privacy Act [place] Records" was provided and verbally reviewed with: Patient and Family  Emergency Contact Information Contact Information    Name Relation Home Work Mobile   Travis Salinas [phone]     Current Medical History  Patient Admitting Diagnosis: 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  Per chart review lives with roommate independent prior to admission and is currently separated.  1 level home 3 flights of stairs.  Plan is to discharge home with his wife with assistance as needed before returning back to his apartment with roommate.  Presented 01/01/2020 with gait instability dizziness left side weakness as well as reported numerous falls without loss of consciousness.  His blood pressure ranged from 110/62-102/68.  Cranial CT scan negative for acute changes.  Noted chronic small vessel changes of the white matter.  Patient did not receive TPA.  CT cervical spine negative for acute process.  MRI/MRA showed several small acute infarcts involving multiple vascular territories bilaterally suggesting a central source.  No proximal intracranial vessel occlusion or stenosis.  Echocardiogram with ejection fraction of 49% grade 1 diastolic dysfunction.  Admission chemistries WBC 14,800, BUN 41, creatinine 1.44, urine drug screen positive opiates cocaine and benzos, troponin high-sensitivity 585, CK 106.  Carotid Dopplers with no ICA stenosis.  Cardiology services consulted for complex tachycardia with underlying bundle branch block was placed on intravenous amiodarone transition to p.o. as well as started on Coreg.  Attempts at cardioversion unsuccessful.  His elevated troponin was felt to be related to demand ischemia/takotsubo syndrome.  He is currently maintained on aspirin and Plavix for CVA prophylaxis.  He had been on Cleviprex for a short time for blood pressure control and monitoring.  Tolerating a regular diet.  Therapy evaluations completed and patient is to be admitted for a comprehensive rehab program on 01/06/20.  Complete NIHSS TOTAL: 2 Glasgow Coma Scale Score: 15  Past Medical History  Past Medical History:  Diagnosis Date  .  Arthritis   . Cervical disc herniation   . COPD (chronic obstructive pulmonary disease) (HCC)   . Headache    " due to antibiotics"  . Hepatitis C   . Hypertension     Family History  family history includes Heart disease in his father and mother; Stroke in his father and mother.  Prior Rehab/Hospitalizations:  Has the patient had prior rehab or hospitalizations prior to admission? No  Has the patient had major surgery during 100 days prior to admission? No  Current Medications   Current Facility-Administered Medications:  .   stroke: mapping our early stages of recovery book, , Does not apply, Once, Biby, Sharon L, NP, Stopped at 01/02/20 0156 .  0.9 %  sodium chloride infusion, , Intravenous, Continuous, Biby, Sharon L, NP, Stopped at 01/05/20 0757 .  acetaminophen (TYLENOL) tablet 650 mg, 650 mg, Oral, Q4H PRN, 650 mg at 01/06/20 1020 **OR** acetaminophen (TYLENOL) 160 MG/5ML solution 650 mg, 650 mg, Per Tube, Q4H PRN **OR** acetaminophen (TYLENOL) suppository 650 mg, 650 mg, Rectal, Q4H PRN, Biby, Sharon L, NP .  albuterol (PROVENTIL) (2.5 MG/3ML) 0.083% nebulizer solution 2.5 mg, 2.5 mg, Nebulization, Q2H PRN, Biby, Sharon L, NP, 2.5 mg at 01/06/20 0740 .  amantadine (SYMMETREL) 50 MG/5ML solution 50 mg, 50 mg, Oral, BID, Biby, Sharon L, NP, 50 mg at 01/06/20 1020 .  amiodarone (PACERONE) tablet 200 mg, 200 mg, Oral, BID, Biby, Sharon L, NP, 200 mg at 01/06/20 1020 .  ARIPiprazole (ABILIFY) tablet 10 mg, 10 mg, Oral, Daily, Biby, Sharon L, NP, 10 mg at 01/06/20 1020 .  aspirin EC tablet 81 mg, 81 mg, Oral, Daily, Biby, Sharon L, NP, 81 mg at 01/06/20 1019 .  atorvastatin (LIPITOR) tablet 40 mg, 40 mg, Oral, Daily, Biby, Sharon L, NP, 40 mg at 01/06/20 1019 .  benzonatate (TESSALON) capsule 200 mg, 200 mg, Oral, TID, Biby, Sharon L, NP, 200 mg at 01/06/20 1657 .  carvedilol (COREG) tablet 3.125 mg, 3.125 mg, Oral, BID WC, Biby, Sharon L, NP, 3.125 mg at 01/06/20 1656 .   Chlorhexidine Gluconate Cloth 2 % PADS 6 each, 6 each, Topical, Daily, Biby, Sharon L, NP, 6 each at 01/06/20 1020 .  clonazePAM (KLONOPIN) tablet 1 mg, 1 mg, Oral, BID, Biby, Sharon L, NP, 1 mg at 01/06/20 1019 .  clopidogrel (PLAVIX) tablet 75 mg, 75 mg, Oral, Daily, Biby, Sharon L, NP, 75 mg at 01/06/20 1020 .  FLUoxetine (PROZAC) capsule 10 mg, 10 mg, Oral, Daily, Biby, Sharon L, NP, 10 mg at 01/06/20 1020 .  ipratropium-albuterol (DUONEB) 0.5-2.5 (3) MG/3ML nebulizer solution 3 mL, 3 mL, Nebulization, Q6H PRN, Sethi, Pramod S, MD .  lamoTRIgine (LAMICTAL) tablet 200 mg, 200 mg, Oral, q morning - 10a, Biby, Sharon L, NP, 200 mg at 01/06/20 1020 .  oxyCODONE (OXYCONTIN) 12 hr tablet 20 mg, 20 mg, Oral, Q12H PRN, Biby, Sharon L, NP, 20 mg at 01/06/20 1234 .  pantoprazole (PROTONIX) EC tablet 40 mg, 40 mg,   Oral, QHS, Biby, Sharon L, NP, 40 mg at 01/05/20 2132 .  senna-docusate (Senokot-S) tablet 1 tablet, 1 tablet, Oral, QHS PRN, Biby, Sharon L, NP, 1 tablet at 01/06/20 0026 .  sodium chloride flush (NS) 0.9 % injection 10-40 mL, 10-40 mL, Intracatheter, Q12H, Biby, Sharon L, NP, 10 mL at 01/04/20 2112 .  sodium chloride flush (NS) 0.9 % injection 10-40 mL, 10-40 mL, Intracatheter, PRN, Biby, Sharon L, NP .  tamsulosin (FLOMAX) capsule 0.4 mg, 0.4 mg, Oral, Daily, Biby, Sharon L, NP, 0.4 mg at 01/06/20 1020  Patients Current Diet:  Diet Order            Diet heart healthy/carb modified Room service appropriate? Yes; Fluid consistency: Thin  Diet effective now              Precautions / Restrictions Precautions Precautions: Fall Restrictions Weight Bearing Restrictions: No   Has the patient had 2 or more falls or a fall with injury in the past year?Yes  Prior Activity Level Limited Community (1-2x/wk): was not driving and is retired; would assist with grocery store runs using family or friends to drive him  Prior Functional Level Prior Function Level of Independence:  Independent  Self Care: Did the patient need help bathing, dressing, using the toilet or eating?  Independent  Indoor Mobility: Did the patient need assistance with walking from room to room (with or without device)? Independent  Stairs: Did the patient need assistance with internal or external stairs (with or without device)? Independent  Functional Cognition: Did the patient need help planning regular tasks such as shopping or remembering to take medications? Independent  Home Assistive Devices / Equipment Home Equipment: Shower seat - built in  Prior Device Use: Indicate devices/aids used by the patient prior to current illness, exacerbation or injury? None of the above  Current Functional Level Cognition  Arousal/Alertness: Awake/alert Overall Cognitive Status: Within Functional Limits for tasks assessed Current Attention Level: Sustained Orientation Level: Oriented X4 Following Commands: Follows multi-step commands with increased time, Follows one step commands consistently Safety/Judgement: Decreased awareness of safety, Decreased awareness of deficits General Comments: Pt continues to lack awareness for fall risk, increases speed when ambulating, and requires increased cues to take a break Attention: Alternating Alternating Attention: Appears intact Memory: Appears intact Awareness: Impaired Awareness Impairment: Anticipatory impairment Problem Solving: Impaired Executive Function: Self Monitoring, Organizing Organizing: Impaired Organizing Impairment: Verbal complex Self Monitoring: Impaired Self Monitoring Impairment: Verbal complex Behaviors: Impulsive Safety/Judgment: Impaired    Extremity Assessment (includes Sensation/Coordination)  Upper Extremity Assessment: Defer to OT evaluation LUE Deficits / Details: strength is 4+/5 but fatigues easily  Lower Extremity Assessment: LLE deficits/detail, RLE deficits/detail RLE Deficits / Details: WFL LLE Deficits /  Details: functional strength, pt able to isolate movements.  quick to fatigue and lose coordination during gait. LLE Coordination: decreased fine motor    ADLs  Overall ADL's : Needs assistance/impaired Eating/Feeding: Set up, Sitting Grooming: Set up, Sitting, Wash/dry hands Upper Body Bathing: Minimal assistance, Sitting Lower Body Bathing: Sit to/from stand, Cueing for safety, Cueing for sequencing, Moderate assistance Lower Body Bathing Details (indicate cue type and reason): Mod A to don boxers, required assist to figure out front and back, and to thread around first foot, min cues for safety in sit to stand to don Upper Body Dressing : Minimal assistance, Sitting Lower Body Dressing: Moderate assistance, Sit to/from stand Toilet Transfer: Minimal assistance Toilet Transfer Details (indicate cue type and reason): Simulated to recliner, utilized rollator, min   A due to increased cues and assist to lock brakes, continues to require cues for energy conservation Toileting- Clothing Manipulation and Hygiene: Minimal assistance, Sit to/from stand Functional mobility during ADLs: Minimal assistance, Moderate assistance General ADL Comments: Min/Mod A to complete dressing ADLs and cues for energy conservation, took 1 rest break in session to date, demonstrates impulsive movement when steering rollator, requiring cues for pacing and to increase attention to L side. Pt was able to navigate, but demonstrated several instances of coming real close to bumping into environment on L    Mobility  Overal bed mobility: Modified Independent Bed Mobility: Supine to Sit Supine to sit: Min guard Sit to supine: Min guard General bed mobility comments: No use of bed rails in session    Transfers  Overall transfer level: Needs assistance Equipment used: 4-wheeled walker Transfers: Sit to/from Stand Sit to Stand: Min guard General transfer comment: Continues to rush movement when transferring, however was  able to heed cues about rollator to use brakes in room, but braked at the door instead of bringing device all the way to the recliner    Ambulation / Gait / Stairs / Wheelchair Mobility  Ambulation/Gait Ambulation/Gait assistance: Min assist, Counsellor (Feet): (120 ft X 2 trials with seated rest break due to SOB ) Assistive device: None(assist at trunk with gait belt) Gait Pattern/deviations: Step-through pattern, Decreased stride length, Drifts right/left General Gait Details: min guard assist if pt on straight path with guarded movements; min A required for balance with challenges (direction changes, horizontal head turns, turning); cues for safe gait speed Gait velocity: varying speeds Gait velocity interpretation: <1.8 ft/sec, indicate of risk for recurrent falls Stairs: Yes Stairs assistance: Min guard Stair Management: Two rails, Step to pattern, Forwards Number of Stairs: (2 steps X 2 trials) General stair comments: min guard for safety    Posture / Balance Balance Overall balance assessment: Needs assistance Sitting-balance support: Feet supported Sitting balance-Leahy Scale: Good Standing balance support: During functional activity Standing balance-Leahy Scale: Fair Standing balance comment: able to maintain static standing without UE support; assist/support needed for gait, speeds up movement when challenged High level balance activites: Other (comment)(picking items off the floor ) High Level Balance Comments: pt picking item off the floor and DOE. pt requires rest break    Special needs/care consideration Skin: abrasion to upper medial nose, ecchymosis to arm, elbow (right), Diabetic management : no and Designated visitor: wife: [place] (from acute therapy documentation) Living Arrangements: Non-relatives/Friends(Travis)  Lives With: Other (Comment)(roommate) Available Help at Discharge: Friend(s), Available 24  hours/day Type of Home: Other(Comment)(condo) Home Layout: One level Home Access: Stairs to enter Entrance Stairs-Rails: Right, Left Entrance Stairs-Number of Steps: 3 flights Bathroom Shower/Tub: Multimedia programmer: Standard  Discharge Living Setting Plans for Discharge Living Setting: House, Other (Comment)(will go stay with wife (they were separated PTA)) Type of Home at Discharge: House Discharge Home Layout: One level Discharge Home Access: Level entry Discharge Bathroom Shower/Tub: Tub/shower unit Discharge Bathroom Toilet: Standard Discharge Bathroom Accessibility: Yes How Accessible: Accessible via walker Does the patient have any problems obtaining your medications?: No  Social/Family/Support Systems Patient Roles: Other (Comment)(has roommate; separated from wife) Contact Information: wife: [name] [phone]; Darnelle Maffucci (roommate): [phone] Anticipated Caregiver: wife Anticipated Caregiver's Contact Information: see above for contact info Ability/Limitations of Caregiver: Supervision Caregiver Availability: 24/7 Discharge Plan Discussed with Primary Caregiver: Yes Is Caregiver In Agreement with Plan?: Yes Does Caregiver/Family have  Issues with Lodging/Transportation while Pt is in Rehab?: No   Goals Patient/Family Goal for Rehab: PT/OT: Mod I/Supervision; SLP: Supervision Expected length of stay: 7-10 days Cultural Considerations: NA Pt/Family Agrees to Admission and willing to participate: Yes Program Orientation Provided & Reviewed with Pt/Caregiver Including Roles  & Responsibilities: Yes(pt and his wife)  Barriers to Discharge: Other (comments)(will eventually need to transition back to his apartment)   Decrease burden of Care through IP rehab admission: NA   Possible need for SNF placement upon discharge:Not anticipated; pt has good family support and anticipate he can reach Supervision level quickly with CIR to be able to return home with  his wife and then transition back to his apartment with his roommate.    Patient Condition: This patient's condition remains as documented in the consult dated 01/04/20, in which the Rehabilitation Physician determined and documented that the patient's condition is appropriate for intensive rehabilitative care in an inpatient rehabilitation facility pending dispo and progression with thearpies. These areas have been addressed. Pt continues to requires physical assistance with functional ADLs and ambulation and pt has confirmed 24/7 Supervision at DC. Will admit to inpatient rehab today.  Preadmission Screen Completed By:  Raechel Ache, OT, 01/06/2020 4:57 PM ______________________________________________________________________   Discussed status with Dr. Dagoberto Ligas on 01/06/20 at 4:57PM and received approval for admission today.  Admission Coordinator:  Raechel Ache, time 4:57PM/Date 01/06/20

## 2020-01-05 NOTE — Progress Notes (Signed)
Occupational Therapy Treatment Patient Details Name: Travis Salinas MRN: [id number] DOB: [birth year] Today's Date: 01/05/2020    History of present illness Pt is a 69 year old man admitted with falls and L side weakness. Admininstered tPA. CT scan negative for acute changes, MRI results pending. Hospital course complicated by tachycardia. Cardioverted 4/25.  Pt PMH: COPD, CHF, arthritis, HepC, HTN, cocaine abuse.    OT comments  Pt requires cues for safety and energy conservation throughout session. Pt requires x4 rest breaks to go to RN station and get a cup of water. Pt needed cues to locate socks in room to exit the room and cues to sit to don socks. Pt able to figure 4 to don socks in seated position. Pt expressed wife will support upon d/c and that he wants CIR.    Follow Up Recommendations  CIR    Equipment Recommendations  Other (comment)(rollator possibly for a seat for energy conservation)    Recommendations for Other Services      Precautions / Restrictions Precautions Precautions: Fall Restrictions Weight Bearing Restrictions: No       Mobility Bed Mobility               General bed mobility comments: oob in bathroom on arrival.   Transfers Overall transfer level: Needs assistance Equipment used: None Transfers: Sit to/from Stand Sit to Stand: Min guard         General transfer comment: pt undershooting chair and requires min (A) to descend to chair without missing the edge of chair    Balance Overall balance assessment: Needs assistance Sitting-balance support: Feet supported Sitting balance-Leahy Scale: Poor     Standing balance support: During functional activity Standing balance-Leahy Scale: Fair               High level balance activites: Other (comment)(picking items off the floor ) High Level Balance Comments: pt picking item off the floor and DOE. pt requires rest break           ADL either performed or assessed with  clinical judgement   ADL Overall ADL's : Needs assistance/impaired Eating/Feeding: Set up;Sitting   Grooming: Set up;Sitting;Wash/dry hands                   Toilet Transfer: Loss adjuster, chartered Details (indicate cue type and reason): pt requires mod cues for energy conservation for task         Functional mobility during ADLs: Min guard General ADL Comments: pt requires cues throughout session for energy conservation. pt requires x4 rest breaks during session. pt able to navigate to RN staff and make a cup of ice water but requires x2 rest break after  getting cup     Vision       Perception     Praxis      Cognition Arousal/Alertness: Awake/alert Behavior During Therapy: WFL for tasks assessed/performed Overall Cognitive Status: Within Functional Limits for tasks assessed Area of Impairment: Safety/judgement                         Safety/Judgement: Decreased awareness of safety;Decreased awareness of deficits     General Comments: pt lacks awareness to fall risk and increases speed when starting to feel fatigued vs taking a rest break.         Exercises     Shoulder Instructions       General Comments      Pertinent  Vitals/ Pain       Pain Assessment: No/denies pain  Home Living                                          Prior Functioning/Environment              Frequency           Progress Toward Goals  OT Goals(current goals can now be found in the care plan section)  Progress towards OT goals: Progressing toward goals  Acute Rehab OT Goals Patient Stated Goal: to go to rehab  OT Goal Formulation: With patient Time For Goal Achievement: 01/17/20 Potential to Achieve Goals: Good ADL Goals Pt Will Perform Grooming: with supervision;standing(met) Pt Will Perform Upper Body Dressing: sitting;with set-up Pt Will Perform Lower Body Dressing: with supervision;sit to/from stand Pt Will  Transfer to Toilet: with supervision;ambulating;regular height toilet Pt Will Perform Toileting - Clothing Manipulation and hygiene: with supervision;sit to/from stand Additional ADL Goal #1: Pt will monitor himself for signs of fatigue and initiate rest break to prevent falls.  Plan Discharge plan remains appropriate    Co-evaluation                 AM-PAC OT "6 Clicks" Daily Activity     Outcome Measure   Help from another person eating meals?: A Little Help from another person taking care of personal grooming?: A Little Help from another person toileting, which includes using toliet, bedpan, or urinal?: A Little Help from another person bathing (including washing, rinsing, drying)?: A Lot Help from another person to put on and taking off regular upper body clothing?: A Little Help from another person to put on and taking off regular lower body clothing?: A Little 6 Click Score: 17    End of Session    OT Visit Diagnosis: Unsteadiness on feet (R26.81);Other abnormalities of gait and mobility (R26.89);Muscle weakness (generalized) (M62.81);Hemiplegia and hemiparesis;Other symptoms and signs involving cognitive function Hemiplegia - Right/Left: Left Hemiplegia - dominant/non-dominant: Non-Dominant Hemiplegia - caused by: Cerebral infarction   Activity Tolerance Patient tolerated treatment well   Patient Left in chair;with call bell/phone within reach;with chair alarm set   Nurse Communication Mobility status;Precautions        Time: 9169-4503 OT Time Calculation (min): 28 min  Charges: OT General Charges $OT Visit: 1 Visit OT Treatments $Self Care/Home Management : 23-37 mins   Brynn, OTR/L  Acute Rehabilitation Services Pager: [phone] Office: [phone] .    Jeri Modena 01/05/2020, 3:11 PM

## 2020-01-05 NOTE — Progress Notes (Signed)
Paged Dr Leonel Ramsay to report that IV was infiltrated and had to be removed and pt is refusing to get another IV site because said he is drinking lots of fluids and also he is hoping to go home today and he does not see the need for another.  Dr Leonel Ramsay did call back and said OK.

## 2020-01-06 ENCOUNTER — Inpatient Hospital Stay (HOSPITAL_COMMUNITY)
Admission: AD | Admit: 2020-01-06 | Discharge: 2020-01-12 | DRG: 056 | Disposition: A | Discharge status: Home / Self Care | Payer: PPO | Source: Intra-hospital | Attending: Physical Medicine & Rehabilitation | Admitting: Physical Medicine & Rehabilitation

## 2020-01-06 ENCOUNTER — Encounter (HOSPITAL_COMMUNITY): Payer: Self-pay | Admitting: Physical Medicine & Rehabilitation

## 2020-01-06 DIAGNOSIS — N400 Enlarged prostate without lower urinary tract symptoms: Secondary | ICD-10-CM | POA: Diagnosis not present

## 2020-01-06 DIAGNOSIS — E785 Hyperlipidemia, unspecified: Secondary | ICD-10-CM | POA: Diagnosis not present

## 2020-01-06 DIAGNOSIS — N182 Chronic kidney disease, stage 2 (mild): Secondary | ICD-10-CM | POA: Diagnosis not present

## 2020-01-06 DIAGNOSIS — I472 Ventricular tachycardia, unspecified: Secondary | ICD-10-CM

## 2020-01-06 DIAGNOSIS — G8929 Other chronic pain: Secondary | ICD-10-CM | POA: Diagnosis present

## 2020-01-06 DIAGNOSIS — B1920 Unspecified viral hepatitis C without hepatic coma: Secondary | ICD-10-CM | POA: Diagnosis present

## 2020-01-06 DIAGNOSIS — Z79899 Other long term (current) drug therapy: Secondary | ICD-10-CM

## 2020-01-06 DIAGNOSIS — Z87891 Personal history of nicotine dependence: Secondary | ICD-10-CM | POA: Diagnosis not present

## 2020-01-06 DIAGNOSIS — Z823 Family history of stroke: Secondary | ICD-10-CM

## 2020-01-06 DIAGNOSIS — I69354 Hemiplegia and hemiparesis following cerebral infarction affecting left non-dominant side: Principal | ICD-10-CM

## 2020-01-06 DIAGNOSIS — Z8249 Family history of ischemic heart disease and other diseases of the circulatory system: Secondary | ICD-10-CM

## 2020-01-06 DIAGNOSIS — I63139 Cerebral infarction due to embolism of unspecified carotid artery: Secondary | ICD-10-CM | POA: Diagnosis not present

## 2020-01-06 DIAGNOSIS — E8809 Other disorders of plasma-protein metabolism, not elsewhere classified: Secondary | ICD-10-CM | POA: Diagnosis not present

## 2020-01-06 DIAGNOSIS — Z7951 Long term (current) use of inhaled steroids: Secondary | ICD-10-CM | POA: Diagnosis not present

## 2020-01-06 DIAGNOSIS — J449 Chronic obstructive pulmonary disease, unspecified: Secondary | ICD-10-CM | POA: Diagnosis not present

## 2020-01-06 DIAGNOSIS — G441 Vascular headache, not elsewhere classified: Secondary | ICD-10-CM | POA: Diagnosis not present

## 2020-01-06 DIAGNOSIS — I6310 Cerebral infarction due to embolism of unspecified precerebral artery: Secondary | ICD-10-CM | POA: Diagnosis not present

## 2020-01-06 DIAGNOSIS — G894 Chronic pain syndrome: Secondary | ICD-10-CM

## 2020-01-06 DIAGNOSIS — D62 Acute posthemorrhagic anemia: Secondary | ICD-10-CM | POA: Diagnosis not present

## 2020-01-06 DIAGNOSIS — N179 Acute kidney failure, unspecified: Secondary | ICD-10-CM

## 2020-01-06 DIAGNOSIS — I130 Hypertensive heart and chronic kidney disease with heart failure and stage 1 through stage 4 chronic kidney disease, or unspecified chronic kidney disease: Secondary | ICD-10-CM | POA: Diagnosis not present

## 2020-01-06 DIAGNOSIS — Z888 Allergy status to other drugs, medicaments and biological substances status: Secondary | ICD-10-CM | POA: Diagnosis not present

## 2020-01-06 DIAGNOSIS — E46 Unspecified protein-calorie malnutrition: Secondary | ICD-10-CM | POA: Diagnosis not present

## 2020-01-06 DIAGNOSIS — Z880 Allergy status to penicillin: Secondary | ICD-10-CM | POA: Diagnosis not present

## 2020-01-06 DIAGNOSIS — I5042 Chronic combined systolic (congestive) and diastolic (congestive) heart failure: Secondary | ICD-10-CM

## 2020-01-06 DIAGNOSIS — R000 Tachycardia, unspecified: Secondary | ICD-10-CM | POA: Diagnosis present

## 2020-01-06 DIAGNOSIS — I639 Cerebral infarction, unspecified: Secondary | ICD-10-CM | POA: Diagnosis present

## 2020-01-06 DIAGNOSIS — I10 Essential (primary) hypertension: Secondary | ICD-10-CM | POA: Diagnosis not present

## 2020-01-06 DIAGNOSIS — I5043 Acute on chronic combined systolic (congestive) and diastolic (congestive) heart failure: Secondary | ICD-10-CM | POA: Diagnosis not present

## 2020-01-06 DIAGNOSIS — F329 Major depressive disorder, single episode, unspecified: Secondary | ICD-10-CM | POA: Diagnosis not present

## 2020-01-06 DIAGNOSIS — G479 Sleep disorder, unspecified: Secondary | ICD-10-CM | POA: Diagnosis not present

## 2020-01-06 DIAGNOSIS — I6340 Cerebral infarction due to embolism of unspecified cerebral artery: Secondary | ICD-10-CM | POA: Diagnosis present

## 2020-01-06 DIAGNOSIS — M1990 Unspecified osteoarthritis, unspecified site: Secondary | ICD-10-CM | POA: Diagnosis not present

## 2020-01-06 DIAGNOSIS — Z881 Allergy status to other antibiotic agents status: Secondary | ICD-10-CM

## 2020-01-06 DIAGNOSIS — I6300 Cerebral infarction due to thrombosis of unspecified precerebral artery: Secondary | ICD-10-CM | POA: Diagnosis not present

## 2020-01-06 DIAGNOSIS — R29898 Other symptoms and signs involving the musculoskeletal system: Secondary | ICD-10-CM

## 2020-01-06 MED ORDER — FLUOXETINE HCL 10 MG PO CAPS
10.0000 mg | ORAL_CAPSULE | Freq: Every day | ORAL | Status: DC
  Administered 2020-01-07 – 2020-01-12 (×6): 10 mg via ORAL
  Filled 2020-01-06 (×6): qty 1

## 2020-01-06 MED ORDER — AMANTADINE HCL 50 MG/5ML PO SYRP
50.0000 mg | ORAL_SOLUTION | Freq: Two times a day (BID) | ORAL | Status: DC
  Administered 2020-01-06 – 2020-01-12 (×12): 50 mg via ORAL
  Filled 2020-01-06 (×13): qty 5

## 2020-01-06 MED ORDER — TAMSULOSIN HCL 0.4 MG PO CAPS
0.4000 mg | ORAL_CAPSULE | Freq: Every day | ORAL | Status: DC
  Administered 2020-01-07 – 2020-01-12 (×6): 0.4 mg via ORAL
  Filled 2020-01-06 (×6): qty 1

## 2020-01-06 MED ORDER — CARVEDILOL 3.125 MG PO TABS: 3.1250 mg | ORAL_TABLET | Freq: Two times a day (BID) | ORAL | 1 refills | Status: DC

## 2020-01-06 MED ORDER — SENNOSIDES-DOCUSATE SODIUM 8.6-50 MG PO TABS: 1.0000 | ORAL_TABLET | Freq: Every evening | ORAL | Status: DC | PRN

## 2020-01-06 MED ORDER — ATORVASTATIN CALCIUM 40 MG PO TABS
40.0000 mg | ORAL_TABLET | Freq: Every day | ORAL | Status: DC
  Administered 2020-01-07 – 2020-01-12 (×6): 40 mg via ORAL
  Filled 2020-01-06 (×6): qty 1

## 2020-01-06 MED ORDER — ACETAMINOPHEN 160 MG/5ML PO SOLN: 650.0000 mg | ORAL | Status: DC | PRN

## 2020-01-06 MED ORDER — CLONAZEPAM 0.5 MG PO TABS
1.0000 mg | ORAL_TABLET | Freq: Two times a day (BID) | ORAL | Status: DC
  Administered 2020-01-06 – 2020-01-12 (×12): 1 mg via ORAL
  Filled 2020-01-06 (×12): qty 2

## 2020-01-06 MED ORDER — CARVEDILOL 3.125 MG PO TABS
3.1250 mg | ORAL_TABLET | Freq: Two times a day (BID) | ORAL | Status: DC
  Administered 2020-01-07 – 2020-01-12 (×11): 3.125 mg via ORAL
  Filled 2020-01-06 (×11): qty 1

## 2020-01-06 MED ORDER — ACETAMINOPHEN 325 MG PO TABS: 650.0000 mg | ORAL_TABLET | ORAL | Status: DC | PRN

## 2020-01-06 MED ORDER — OXYCODONE HCL ER 10 MG PO T12A
20.0000 mg | EXTENDED_RELEASE_TABLET | Freq: Two times a day (BID) | ORAL | Status: DC | PRN
  Administered 2020-01-07 – 2020-01-08 (×3): 20 mg via ORAL
  Filled 2020-01-06 (×3): qty 2

## 2020-01-06 MED ORDER — ACETAMINOPHEN 325 MG PO TABS
650.0000 mg | ORAL_TABLET | ORAL | Status: DC | PRN
  Administered 2020-01-06 – 2020-01-10 (×8): 650 mg via ORAL
  Filled 2020-01-06 (×8): qty 2

## 2020-01-06 MED ORDER — PANTOPRAZOLE SODIUM 40 MG PO TBEC
40.0000 mg | DELAYED_RELEASE_TABLET | Freq: Every day | ORAL | Status: DC
  Administered 2020-01-06 – 2020-01-11 (×6): 40 mg via ORAL
  Filled 2020-01-06 (×6): qty 1

## 2020-01-06 MED ORDER — BENZONATATE 100 MG PO CAPS
200.0000 mg | ORAL_CAPSULE | Freq: Three times a day (TID) | ORAL | Status: DC
  Administered 2020-01-06 – 2020-01-12 (×18): 200 mg via ORAL
  Filled 2020-01-06 (×19): qty 2

## 2020-01-06 MED ORDER — AMIODARONE HCL 200 MG PO TABS
200.0000 mg | ORAL_TABLET | Freq: Two times a day (BID) | ORAL | Status: DC
  Administered 2020-01-06 – 2020-01-11 (×10): 200 mg via ORAL
  Filled 2020-01-06 (×10): qty 1

## 2020-01-06 MED ORDER — ARIPIPRAZOLE 5 MG PO TABS
10.0000 mg | ORAL_TABLET | Freq: Every day | ORAL | Status: DC
  Administered 2020-01-07 – 2020-01-12 (×6): 10 mg via ORAL
  Filled 2020-01-06 (×6): qty 2

## 2020-01-06 MED ORDER — SORBITOL 70 % SOLN
30.0000 mL | Freq: Every day | Status: DC | PRN
  Administered 2020-01-08: 30 mL via ORAL
  Filled 2020-01-06: qty 30

## 2020-01-06 MED ORDER — ATORVASTATIN CALCIUM 40 MG PO TABS: 40.0000 mg | ORAL_TABLET | Freq: Every day | ORAL | 1 refills | Status: DC

## 2020-01-06 MED ORDER — LAMOTRIGINE 100 MG PO TABS
200.0000 mg | ORAL_TABLET | Freq: Every morning | ORAL | Status: DC
  Administered 2020-01-07 – 2020-01-12 (×6): 200 mg via ORAL
  Filled 2020-01-06 (×6): qty 2

## 2020-01-06 MED ORDER — IPRATROPIUM-ALBUTEROL 0.5-2.5 (3) MG/3ML IN SOLN: 3.0000 mL | Freq: Four times a day (QID) | RESPIRATORY_TRACT | Status: DC | PRN

## 2020-01-06 MED ORDER — STROKE: EARLY STAGES OF RECOVERY BOOK: 1.0000 | Freq: Once | Status: DC

## 2020-01-06 MED ORDER — CLOPIDOGREL BISULFATE 75 MG PO TABS: 75.0000 mg | ORAL_TABLET | Freq: Every day | ORAL | 1 refills | Status: DC

## 2020-01-06 MED ORDER — ACETAMINOPHEN 650 MG RE SUPP: 650.0000 mg | RECTAL | Status: DC | PRN

## 2020-01-06 MED ORDER — ASPIRIN EC 81 MG PO TBEC
81.0000 mg | DELAYED_RELEASE_TABLET | Freq: Every day | ORAL | Status: DC
  Administered 2020-01-07 – 2020-01-12 (×6): 81 mg via ORAL
  Filled 2020-01-06 (×6): qty 1

## 2020-01-06 MED ORDER — ASPIRIN 81 MG PO TBEC: 81.0000 mg | DELAYED_RELEASE_TABLET | Freq: Every day | ORAL | Status: DC

## 2020-01-06 MED ORDER — CLOPIDOGREL BISULFATE 75 MG PO TABS
75.0000 mg | ORAL_TABLET | Freq: Every day | ORAL | Status: DC
  Administered 2020-01-07 – 2020-01-12 (×6): 75 mg via ORAL
  Filled 2020-01-06 (×6): qty 1

## 2020-01-06 MED ORDER — AMIODARONE HCL 200 MG PO TABS: 200.0000 mg | ORAL_TABLET | Freq: Two times a day (BID) | ORAL | 1 refills | Status: DC

## 2020-01-06 MED ORDER — ALBUTEROL SULFATE (2.5 MG/3ML) 0.083% IN NEBU
2.5000 mg | INHALATION_SOLUTION | RESPIRATORY_TRACT | Status: DC | PRN
  Administered 2020-01-12: 2.5 mg via RESPIRATORY_TRACT
  Filled 2020-01-06: qty 3

## 2020-01-06 NOTE — Progress Notes (Addendum)
STROKE TEAM PROGRESS NOTE   INTERVAL HISTORY Nursing students in room at bedside as he just was assisted with ADLs. Sitting in chair and explains he needs rehab. Apparently Pre-cert was started yesterday.  Neurological exam is unchanged.  Vital signs are stable.  Medically stable to go to inpatient rehab but awaiting insurance approval and bed availability  OBJECTIVE Vitals:   01/06/20 0417 01/06/20 0748 01/06/20 0948 01/06/20 1150  BP: 124/84 107/72 106/68 (!) 117/56  Pulse: 73 72 82 75  Resp: 19 18  18   Temp: 98 F (36.7 C) 97.6 F (36.4 C)  97.8 F (36.6 C)  TempSrc: Oral Oral  Oral  SpO2: 96% 100%  95%  Weight:      Height:        CBC:  Recent Labs  Lab 01/01/20 2210 01/01/20 2210 01/01/20 2232 01/02/20 1934  WBC 14.8*  --   --  13.5*  NEUTROABS 9.5*  --   --   --   HGB 16.7   < > 18.4* 13.8  HCT 54.5*   < > 54.0* 44.4  MCV 87.8  --   --  86.5  PLT 284  --   --  284   < > = values in this interval not displayed.    Basic Metabolic Panel:  Recent Labs  Lab 01/01/20 2210 01/01/20 2210 01/01/20 2232 01/02/20 1934  NA 137   < > 139 140  K 4.1   < > 3.8 3.6  CL 101   < > 105 107  CO2 20*  --   --  20*  GLUCOSE 99   < > 94 124*  BUN 41*   < > 42* 29*  CREATININE 1.44*   < > 1.50* 1.31*  CALCIUM 10.3  --   --  9.1  MG  --   --   --  1.9   < > = values in this interval not displayed.    Lipid Panel:     Component Value Date/Time   CHOL 151 01/02/2020 0329   TRIG 97 01/02/2020 0329   HDL 45 01/02/2020 0329   CHOLHDL 3.4 01/02/2020 0329   VLDL 19 01/02/2020 0329   LDLCALC 87 01/02/2020 0329   HgbA1c:  Lab Results  Component Value Date   HGBA1C 5.2 01/02/2020   Urine Drug Screen:     Component Value Date/Time   LABOPIA POSITIVE (A) 01/02/2020 0544   COCAINSCRNUR POSITIVE (A) 01/02/2020 0544   LABBENZ POSITIVE (A) 01/02/2020 0544   AMPHETMU NONE DETECTED 01/02/2020 0544   THCU NONE DETECTED 01/02/2020 0544   LABBARB NONE DETECTED 01/02/2020 0544     Alcohol Level     Component Value Date/Time   [facility]  05/10/2010 1230    <5        LOWEST DETECTABLE LIMIT FOR SERUM ALCOHOL IS 5 mg/dL FOR MEDICAL PURPOSES ONLY    IMAGING past 24h No results found.   PHYSICAL EXAM  Frail middle-age Caucasian male not in distress. NAD, poor grooming, appears older than stated age           . Afebrile. Head is nontraumatic. Neck is supple without bruit.    Cardiac exam no murmur or gallop. Lungs are clear to auscultation. Distal pulses are well felt.    Neurological exam :  Awake alert oriented to time place and person. Speech is normal; fluent and spontaneous with normal comprehension. Attention normal.  Cranial Nerves: pupils are equal, round, and reactive to light.The face  is symmetric. The palate elevates in the midline. Hearing intact. Voice is normal. Shoulder shrug is normal. The tongue is midline and has normal motion. Voice and hearing intact.  Coordination: Ataxia in the lower extremities Motor: No asymmetry, no atrophy, and no involuntary movements noted. Normal muscle tone. Mild left hemiparesis 4/5 strength with weakness of left grip and intrinsic hand muscles.  Orbits right over left upper extremity.  Fine finger movements are diminished on the left compared to the right. Normal strength on the right.    Sensation: intact to LT Gait: Deferred  ASSESSMENT/PLAN Travis Salinas is a 69 y.o. male with history of Htn, hepatitis C, Cervical disc herniation, and COPD presenting with gait instability, several falls, left sided numbness and bilateral LE weakness with ataxia. The patient received IV t-PA Saturday 01/01/20 at 22:15. +Cocaine in UDS.   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 MRI pending  Code Stroke CT Head - No acute head CT finding. Chronic small-vessel changes of the white matter. ASPECTS is 10   CT CS no acute abnormality. Moderate to severe degenerative changes C5-6, C6-7.  Moderate severe emphysema.   CT face L maxillary sinus dz  CT head post 24h no acute abnormality Small vessel disease. Atrophy.   MRI head -small punctate bicerebral embolic infarcts involving bilateral ACA, left MCA and bilateral cerebellum   MRA head -no large vessel stenosis or occlusion carotid Doppler - B ICA 1-39% stenosis, VAs antegrade    2D Echo - EF 35-40%. No source of embolus   [employer] Virus 2 - neg, serology positive  LDL - 87  HgbA1c - 5.9  UDS - + opiates, cocaine and benzos  HIV - pending  Troponin -elevated at 585 NG/mL   VTE prophylaxis - SCDs  No antithrombotic prior to admission, now on No antithrombotic. Add ASA 81 mg daily.   Therapy recommendations: Inpatient Rehab  Disposition:  Stable and ready for d/c to CIR. Pre-cert started yesterday per case mgt  Hypertension  Home BP meds: tenormin 25 bid  Current BP meds: coreg 3.125 bid  Stable . SBP goal now < 180 mm Hg  . Long-term BP goal normotensive  Hyperlipidemia  Home Lipid lowering medication: none   LDL 87, goal < 70  Now on lipitor 40  Continue statin at discharge  Other Stroke Risk Factors  Advanced age  Former cigarette smoker - quit  Family hx stroke (mother and father)  Substance Abuse - heroin and cocaine. UDS +benzos, opiates and cocaine. Pt advised to stop using d/t stroke risk. SW addressed as well and pt refused cessation resources.  Acute on Chronic systolic and diastolic CHF secondary to cocaine use, UDS+ on admission, EF 35-40%. Takotsubo-like pattern c/w coronary spasm. Continue coreg. Add ARB if BP tolerates. Cardiology on board.  Other Active Problems  Code status - Full code  COPD / Emphysema [id number])  Marked severity left maxillary sinus mucosal thickening  Moderate to marked severity multilevel degenerative changes, most prominent at the levels of C5-C6 and C6-C7  Leukocytosis - 14.8-> improved now (afebrile)  CKD stage 3a - creatinine -  1.44->1.50-> 1.3  Wide complex tachycardia d/t NSR w/ underlying BBB. Treated w/ IV amiodarone. Transitioned to oral. Cardiology on obard. F/u w/ Dr. Marlou Porch as OP.  Elevated troponin c/w demand ischemia from Takotsubo syndrome. Doubt acute coronary syndrome. Cath not needed. Cardiology on board.   Hospital day # 5 Desiree Metzger-Cihelka, ARNP-C, ANVP-BC Pager:  [phone] 01/06/2020 1:32 PM Continue ongoing therapies and mobilize out of bed.  Medically stable to be transferred to inpatient rehab when bed available after insurance approval. Antony Contras, MD To contact Stroke Continuity provider, please refer to [email or web address] After hours, contact General Neurology

## 2020-01-06 NOTE — Progress Notes (Signed)
Patient arrived to unit via wheelchair accompanied by discharge nurse and spouse. Patient presents AAOx4, no s/s of distress and enthusiastic about rehab. Sanda Linger, LPN

## 2020-01-06 NOTE — Progress Notes (Signed)
Raechel Ache, OT  Rehab Admission Coordinator  Physical Medicine and Rehabilitation  PMR Pre-admission     Signed  Date of Service:  01/05/2020  5:08 PM      Related encounter: ED to Hosp-Admission (Discharged) from 01/01/2020 in [facility]      Signed        Show:Clear all [x]Manual[x]Template[x]Copied  Added by: [x]Gentry, Claiborne Billings, OT  []Hover for details PMR Admission Coordinator Pre-Admission Assessment   Patient: Travis Salinas is an 69 y.o., male MRN: [id number] DOB: [birth year] Height: 5' 11" (180.3 cm) Weight: 68 kg                                                                                                                                                  Insurance Information HMO:     PPO: yes     PCP:      IPA:      80/20:      OTHER:  PRIMARY: HTA      Policy#: [id number]      Subscriber: patient CM Name: Colletta [place]      Phone#: [phone]     Fax#: [phone] Pre-Cert#: [id number]      Employer:  Auth of [id number] provided by Colletta [place] for admit to CIR. Pt is approved for 5 days. HTA has epic access so no need to fax.  Benefits:  Phone #: [phone]     Name: option 1, spoke with Sherrie Eff. Date: 09/09/2016-09/08/20     Deduct: no deductible ($0)      Out of Pocket Max: $3,400 ($30 met)      Life Max: NA  CIR: $295/day co-pay for days 1-6, $0/day for days 7-90      SNF: $50/day co-pay for days 1-20, $178/day co-pay for days 21-100; limited to 100 days/benefit period Outpatient: $15/visist co-pay; limited by medical necessity Home Health: 100% coverage, 0% co-insurance, $0 co-pay; limited by medical necessity DME: 80% coverage; 20% co-insurance Providers:  SECONDARY: None      Policy#:       Phone#:    Development worker, community:       Phone#:    The Engineer, [employer]" for patients in Inpatient Rehabilitation Facilities with attached "Privacy Act [place] Records" was provided and verbally reviewed with: Patient  and Family   Emergency Contact Information         Contact Information     Name Relation Home Work Mobile    [place] [phone]       Current Medical History  Patient Admitting Diagnosis: B/L embolic strokes due to cocaine cardiomyopathy   History of Present Illness: KIMOTHY KISHIMOTO is a 69 year old male with history of chronic combined systolic diastolic congestive heart failure, chronic pain syndrome maintained on oxycodone 40 mg 3 times daily as  needed, BPH, CKD stage II with creatinine 1.31-1.44, COPD, hepatitis C, depression maintained on Abilify, Prozac, Klonopin, amantadine as well as Lamictal, polysubstance abuse, hypertension.  Per chart review lives with roommate independent prior to admission and is currently separated.  1 level home 3 flights of stairs.  Plan is to discharge home with his wife with assistance as needed before returning back to his apartment with roommate.  Presented 01/01/2020 with gait instability dizziness left side weakness as well as reported numerous falls without loss of consciousness.  His blood pressure ranged from 110/62-102/68.  Cranial CT scan negative for acute changes.  Noted chronic small vessel changes of the white matter.  Patient did not receive TPA.  CT cervical spine negative for acute process.  MRI/MRA showed several small acute infarcts involving multiple vascular territories bilaterally suggesting a central source.  No proximal intracranial vessel occlusion or stenosis.  Echocardiogram with ejection fraction of 49% grade 1 diastolic dysfunction.  Admission chemistries WBC 14,800, BUN 41, creatinine 1.44, urine drug screen positive opiates cocaine and benzos, troponin high-sensitivity 585, CK 106.  Carotid Dopplers with no ICA stenosis.  Cardiology services consulted for complex tachycardia with underlying bundle branch block was placed on intravenous amiodarone transition to p.o. as well as started on  Coreg.  Attempts at cardioversion unsuccessful.  His elevated troponin was felt to be related to demand ischemia/takotsubo syndrome.  He is currently maintained on aspirin and Plavix for CVA prophylaxis.  He had been on Cleviprex for a short time for blood pressure control and monitoring.  Tolerating a regular diet.  Therapy evaluations completed and patient is to be admitted for a comprehensive rehab program on 01/06/20.   Complete NIHSS TOTAL: 2 Glasgow Coma Scale Score: 15   Past Medical History      Past Medical History:  Diagnosis Date  . Arthritis    . Cervical disc herniation    . COPD (chronic obstructive pulmonary disease) ([place])    . Headache      " due to antibiotics"  . Hepatitis C    . Hypertension        Family History  family history includes Heart disease in his father and mother; Stroke in his father and mother.   Prior Rehab/Hospitalizations:  Has the patient had prior rehab or hospitalizations prior to admission? No   Has the patient had major surgery during 100 days prior to admission? No   Current Medications    Current Facility-Administered Medications:  .   stroke: mapping our early stages of recovery book, , Does not apply, Once, Donzetta Starch, NP, Stopped at 01/02/20 0156 .  0.9 %  sodium chloride infusion, , Intravenous, Continuous, Donzetta Starch, NP, Stopped at 01/05/20 0757 .  acetaminophen (TYLENOL) tablet 650 mg, 650 mg, Oral, Q4H PRN, 650 mg at 01/06/20 1020 **OR** acetaminophen (TYLENOL) 160 MG/5ML solution 650 mg, 650 mg, Per Tube, Q4H PRN **OR** acetaminophen (TYLENOL) suppository 650 mg, 650 mg, Rectal, Q4H PRN, Biby, Sharon L, NP .  albuterol (PROVENTIL) (2.5 MG/3ML) 0.083% nebulizer solution 2.5 mg, 2.5 mg, Nebulization, Q2H PRN, Biby, Sharon L, NP, 2.5 mg at 01/06/20 0740 .  amantadine (SYMMETREL) 50 MG/5ML solution 50 mg, 50 mg, Oral, BID, Biby, Sharon L, NP, 50 mg at 01/06/20 1020 .  amiodarone (PACERONE) tablet 200 mg, 200 mg, Oral, BID,  Biby, Sharon L, NP, 200 mg at 01/06/20 1020 .  ARIPiprazole (ABILIFY) tablet 10 mg, 10 mg, Oral, Daily, Biby, Sharon L, NP, 10 mg  at 01/06/20 1020 .  aspirin EC tablet 81 mg, 81 mg, Oral, Daily, Biby, Sharon L, NP, 81 mg at 01/06/20 1019 .  atorvastatin (LIPITOR) tablet 40 mg, 40 mg, Oral, Daily, Biby, Sharon L, NP, 40 mg at 01/06/20 1019 .  benzonatate (TESSALON) capsule 200 mg, 200 mg, Oral, TID, Biby, Sharon L, NP, 200 mg at 01/06/20 1657 .  carvedilol (COREG) tablet 3.125 mg, 3.125 mg, Oral, BID WC, Biby, Sharon L, NP, 3.125 mg at 01/06/20 1656 .  Chlorhexidine Gluconate Cloth 2 % PADS 6 each, 6 each, Topical, Daily, Biby, Sharon L, NP, 6 each at 01/06/20 1020 .  clonazePAM (KLONOPIN) tablet 1 mg, 1 mg, Oral, BID, Biby, Sharon L, NP, 1 mg at 01/06/20 1019 .  clopidogrel (PLAVIX) tablet 75 mg, 75 mg, Oral, Daily, Biby, Sharon L, NP, 75 mg at 01/06/20 1020 .  FLUoxetine (PROZAC) capsule 10 mg, 10 mg, Oral, Daily, Biby, Sharon L, NP, 10 mg at 01/06/20 1020 .  ipratropium-albuterol (DUONEB) 0.5-2.5 (3) MG/3ML nebulizer solution 3 mL, 3 mL, Nebulization, Q6H PRN, Garvin Fila, MD .  lamoTRIgine (LAMICTAL) tablet 200 mg, 200 mg, Oral, q morning - 10a, Biby, Sharon L, NP, 200 mg at 01/06/20 1020 .  oxyCODONE (OXYCONTIN) 12 hr tablet 20 mg, 20 mg, Oral, Q12H PRN, Burnetta Sabin L, NP, 20 mg at 01/06/20 1234 .  pantoprazole (PROTONIX) EC tablet 40 mg, 40 mg, Oral, QHS, Biby, Sharon L, NP, 40 mg at 01/05/20 2132 .  senna-docusate (Senokot-S) tablet 1 tablet, 1 tablet, Oral, QHS PRN, Donzetta Starch, NP, 1 tablet at 01/06/20 0026 .  sodium chloride flush (NS) 0.9 % injection 10-40 mL, 10-40 mL, Intracatheter, Q12H, Biby, Sharon L, NP, 10 mL at 01/04/20 2112 .  sodium chloride flush (NS) 0.9 % injection 10-40 mL, 10-40 mL, Intracatheter, PRN, Biby, Sharon L, NP .  tamsulosin (FLOMAX) capsule 0.4 mg, 0.4 mg, Oral, Daily, Biby, Sharon L, NP, 0.4 mg at 01/06/20 1020   Patients Current Diet:     Diet Order                       Diet heart healthy/carb modified Room service appropriate? Yes; Fluid consistency: Thin  Diet effective now                   Precautions / Restrictions Precautions Precautions: Fall Restrictions Weight Bearing Restrictions: No    Has the patient had 2 or more falls or a fall with injury in the past year?Yes   Prior Activity Level Limited Community (1-2x/wk): was not driving and is retired; would assist with grocery store runs using family or friends to drive him   Prior Functional Level Prior Function Level of Independence: Independent   Self Care: Did the patient need help bathing, dressing, using the toilet or eating?  Independent   Indoor Mobility: Did the patient need assistance with walking from room to room (with or without device)? Independent   Stairs: Did the patient need assistance with internal or external stairs (with or without device)? Independent   Functional Cognition: Did the patient need help planning regular tasks such as shopping or remembering to take medications? Independent   Home Assistive Devices / Equipment Home Equipment: Shower seat - built in   Prior Device Use: Indicate devices/aids used by the patient prior to current illness, exacerbation or injury? None of the above   Current Functional Level Cognition   Arousal/Alertness: Awake/alert Overall Cognitive Status: Within  Functional Limits for tasks assessed Current Attention Level: Sustained Orientation Level: Oriented X4 Following Commands: Follows multi-step commands with increased time, Follows one step commands consistently Safety/Judgement: Decreased awareness of safety, Decreased awareness of deficits General Comments: Pt continues to lack awareness for fall risk, increases speed when ambulating, and requires increased cues to take a break Attention: Alternating Alternating Attention: Appears intact Memory: Appears intact Awareness: Impaired Awareness  Impairment: Anticipatory impairment Problem Solving: Impaired Executive Function: Self Monitoring, Organizing Organizing: Impaired Organizing Impairment: Verbal complex Self Monitoring: Impaired Self Monitoring Impairment: Verbal complex Behaviors: Impulsive Safety/Judgment: Impaired    Extremity Assessment (includes Sensation/Coordination)   Upper Extremity Assessment: Defer to OT evaluation LUE Deficits / Details: strength is 4+/5 but fatigues easily  Lower Extremity Assessment: LLE deficits/detail, RLE deficits/detail RLE Deficits / Details: WFL LLE Deficits / Details: functional strength, pt able to isolate movements.  quick to fatigue and lose coordination during gait. LLE Coordination: decreased fine motor     ADLs   Overall ADL's : Needs assistance/impaired Eating/Feeding: Set up, Sitting Grooming: Set up, Sitting, Wash/dry hands Upper Body Bathing: Minimal assistance, Sitting Lower Body Bathing: Sit to/from stand, Cueing for safety, Cueing for sequencing, Moderate assistance Lower Body Bathing Details (indicate cue type and reason): Mod A to don boxers, required assist to figure out front and back, and to thread around first foot, min cues for safety in sit to stand to don Upper Body Dressing : Minimal assistance, Sitting Lower Body Dressing: Moderate assistance, Sit to/from stand Toilet Transfer: Minimal assistance Toilet Transfer Details (indicate cue type and reason): Simulated to recliner, utilized rollator, min A due to increased cues and assist to lock brakes, continues to require cues for energy conservation Toileting- Clothing Manipulation and Hygiene: Minimal assistance, Sit to/from stand Functional mobility during ADLs: Minimal assistance, Moderate assistance General ADL Comments: Min/Mod A to complete dressing ADLs and cues for energy conservation, took 1 rest break in session to date, demonstrates impulsive movement when steering rollator, requiring cues for pacing  and to increase attention to L side. Pt was able to navigate, but demonstrated several instances of coming real close to bumping into environment on L     Mobility   Overal bed mobility: Modified Independent Bed Mobility: Supine to Sit Supine to sit: Min guard Sit to supine: Min guard General bed mobility comments: No use of bed rails in session     Transfers   Overall transfer level: Needs assistance Equipment used: 4-wheeled walker Transfers: Sit to/from Stand Sit to Stand: Min guard General transfer comment: Continues to rush movement when transferring, however was able to heed cues about rollator to use brakes in room, but braked at the door instead of bringing device all the way to the recliner     Ambulation / Gait / Stairs / Wheelchair Mobility   Ambulation/Gait Ambulation/Gait assistance: Min assist, Counsellor (Feet): (120 ft X 2 trials with seated rest break due to SOB ) Assistive device: None(assist at trunk with gait belt) Gait Pattern/deviations: Step-through pattern, Decreased stride length, Drifts right/left General Gait Details: min guard assist if pt on straight path with guarded movements; min A required for balance with challenges (direction changes, horizontal head turns, turning); cues for safe gait speed Gait velocity: varying speeds Gait velocity interpretation: <1.8 ft/sec, indicate of risk for recurrent falls Stairs: Yes Stairs assistance: Min guard Stair Management: Two rails, Step to pattern, Forwards Number of Stairs: (2 steps X 2 trials) General stair comments: min guard for safety  Posture / Balance Balance Overall balance assessment: Needs assistance Sitting-balance support: Feet supported Sitting balance-Leahy Scale: Good Standing balance support: During functional activity Standing balance-Leahy Scale: Fair Standing balance comment: able to maintain static standing without UE support; assist/support needed for gait, speeds up  movement when challenged High level balance activites: Other (comment)(picking items off the floor ) High Level Balance Comments: pt picking item off the floor and DOE. pt requires rest break     Special needs/care consideration Skin: abrasion to upper medial nose, ecchymosis to arm, elbow (right), Diabetic management : no and Designated visitor: wife: [place] (from acute therapy documentation) Living Arrangements: Non-relatives/Friends(Travis)  Lives With: Other (Comment)(roommate) Available Help at Discharge: Friend(s), Available 24 hours/day Type of Home: Other(Comment)(condo) Home Layout: One level Home Access: Stairs to enter Entrance Stairs-Rails: Right, Left Entrance Stairs-Number of Steps: 3 flights Bathroom Shower/Tub: Multimedia programmer: Standard   Discharge Living Setting Plans for Discharge Living Setting: House, Other (Comment)(will go stay with wife (they were separated PTA)) Type of Home at Discharge: House Discharge Home Layout: One level Discharge Home Access: Level entry Discharge Bathroom Shower/Tub: Tub/shower unit Discharge Bathroom Toilet: Standard Discharge Bathroom Accessibility: Yes How Accessible: Accessible via walker Does the patient have any problems obtaining your medications?: No   Social/Family/Support Systems Patient Roles: Other (Comment)(has roommate; separated from wife) Contact Information: wife: [name] [phone]; Travis Maffucci (roommate): [phone] Anticipated Caregiver: wife Anticipated Caregiver's Contact Information: see above for contact info Ability/Limitations of Caregiver: Supervision Caregiver Availability: 24/7 Discharge Plan Discussed with Primary Caregiver: Yes Is Caregiver In Agreement with Plan?: Yes Does Caregiver/Family have Issues with Lodging/Transportation while Pt is in Rehab?: No     Goals Patient/Family Goal for Rehab: PT/OT: Mod I/Supervision; SLP:  Supervision Expected length of stay: 7-10 days Cultural Considerations: NA Pt/Family Agrees to Admission and willing to participate: Yes Program Orientation Provided & Reviewed with Pt/Caregiver Including Roles  & Responsibilities: Yes(pt and his wife)  Barriers to Discharge: Other (comments)(will eventually need to transition back to his apartment)     Decrease burden of Care through IP rehab admission: NA     Possible need for SNF placement upon discharge:Not anticipated; pt has good family support and anticipate he can reach Supervision level quickly with CIR to be able to return home with his wife and then transition back to his apartment with his roommate.      Patient Condition: This patient's condition remains as documented in the consult dated 01/04/20, in which the Rehabilitation Physician determined and documented that the patient's condition is appropriate for intensive rehabilitative care in an inpatient rehabilitation facility pending dispo and progression with thearpies. These areas have been addressed. Pt continues to requires physical assistance with functional ADLs and ambulation and pt has confirmed 24/7 Supervision at DC. Will admit to inpatient rehab today.   Preadmission Screen Completed By:  Raechel Ache, OT, 01/06/2020 4:57 PM ______________________________________________________________________   Discussed status with Dr. Dagoberto Ligas on 01/06/20 at 4:57PM and received approval for admission today.   Admission Coordinator:  Raechel Ache, time 4:57PM/Date 01/06/20             Cosigned by: Courtney Heys, MD at 01/06/2020  5:25 PM  Revision History

## 2020-01-06 NOTE — Progress Notes (Signed)
Inpatient Rehabilitation-Admissions Coordinator   I have received insurance approval and medical clearance from attending service for admit to CIR today. Pt and his wife notified with both in agreement to pursue. I have reviewed insurance benefits information and consent forms with pt. All questions answered. RN and [facility] team aware. Please call if questions.   Raechel Ache, OTR/L  Rehab Admissions Coordinator  [phone] 01/06/2020 4:50 PM

## 2020-01-06 NOTE — Progress Notes (Signed)
Occupational Therapy Treatment Patient Details Name: Travis Salinas MRN: [id number] DOB: [birth year] Today's Date: 01/06/2020    History of present illness Pt is a 69 year old man admitted with falls and L side weakness. Admininstered tPA. CT scan negative for acute changes, MRI results pending. Hospital course complicated by tachycardia. Cardioverted 4/25.  Pt PMH: COPD, CHF, arthritis, HepC, HTN, cocaine abuse.    OT comments  Patient continues to make steady progress towards goals in skilled OT session. Patient's session encompassed functional ambulation of household distances, ADLs, and energy conservation techniques. Pt continues to demonstrate minimally impulsive movements when completing tasks (standing before balance established at EOB, almost clipping items on L side when ambulating with rollator) but is able to correct with mod cues. Pt also continues to require increased cues to heed to seated rest breaks for energy conservation, often speeding up gait speed instead of sitting to take a break. Pt would continue to benefit from CIR to address deficits; will continue to follow acutely.    Follow Up Recommendations  CIR    Equipment Recommendations  Other (comment)(Rollator for energy conservation)    Recommendations for Other Services      Precautions / Restrictions Precautions Precautions: Fall Restrictions Weight Bearing Restrictions: No       Mobility Bed Mobility Overal bed mobility: Modified Independent Bed Mobility: Supine to Sit     Supine to sit: Min guard     General bed mobility comments: No use of bed rails in session  Transfers Overall transfer level: Needs assistance Equipment used: 4-wheeled walker   Sit to Stand: Min guard         General transfer comment: Continues to rush movement when transferring, however was able to heed cues about rollator to use brakes in room, but braked at the door instead of bringing device all the way to the  recliner    Balance Overall balance assessment: Needs assistance Sitting-balance support: Feet supported Sitting balance-Leahy Scale: Good     Standing balance support: During functional activity Standing balance-Leahy Scale: Fair Standing balance comment: able to maintain static standing without UE support; assist/support needed for gait, speeds up movement when challenged                           ADL either performed or assessed with clinical judgement   ADL Overall ADL's : Needs assistance/impaired Eating/Feeding: Set up;Sitting           Lower Body Bathing: Sit to/from stand;Cueing for safety;Cueing for sequencing;Moderate assistance Lower Body Bathing Details (indicate cue type and reason): Mod A to don boxers, required assist to figure out front and back, and to thread around first foot, min cues for safety in sit to stand to don         Toilet Transfer: Minimal assistance Toilet Transfer Details (indicate cue type and reason): Simulated to recliner, utilized rollator, min A due to increased cues and assist to lock brakes, continues to require cues for energy conservation         Functional mobility during ADLs: Minimal assistance;Moderate assistance General ADL Comments: Min/Mod A to complete dressing ADLs and cues for energy conservation, took 1 rest break in session to date, demonstrates impulsive movement when steering rollator, requiring cues for pacing and to increase attention to L side. Pt was able to navigate, but demonstrated several instances of coming real close to bumping into environment on L     Vision Baseline  Vision/History: Wears glasses Patient Visual Report: No change from baseline     Perception     Praxis      Cognition Arousal/Alertness: Awake/alert Behavior During Therapy: WFL for tasks assessed/performed Overall Cognitive Status: Within Functional Limits for tasks assessed Area of Impairment:  Safety/judgement;Attention;Awareness;Following commands                   Current Attention Level: Sustained   Following Commands: Follows multi-step commands with increased time;Follows one step commands consistently Safety/Judgement: Decreased awareness of safety;Decreased awareness of deficits Awareness: Emergent   General Comments: Pt continues to lack awareness for fall risk, increases speed when ambulating, and requires increased cues to take a break        Exercises     Shoulder Instructions       General Comments      Pertinent Vitals/ Pain       Pain Assessment: No/denies pain  Home Living                                          Prior Functioning/Environment              Frequency           Progress Toward Goals  OT Goals(current goals can now be found in the care plan section)  Progress towards OT goals: Progressing toward goals  Acute Rehab OT Goals Patient Stated Goal: to go to rehab  OT Goal Formulation: With patient Time For Goal Achievement: 01/17/20 Potential to Achieve Goals: Good  Plan Discharge plan remains appropriate    Co-evaluation                 AM-PAC OT "6 Clicks" Daily Activity     Outcome Measure   Help from another person eating meals?: A Little Help from another person taking care of personal grooming?: A Little Help from another person toileting, which includes using toliet, bedpan, or urinal?: A Little Help from another person bathing (including washing, rinsing, drying)?: A Lot Help from another person to put on and taking off regular upper body clothing?: A Little Help from another person to put on and taking off regular lower body clothing?: A Little 6 Click Score: 17    End of Session Equipment Utilized During Treatment: Other (comment)(Rollator)  OT Visit Diagnosis: Unsteadiness on feet (R26.81);Other abnormalities of gait and mobility (R26.89);Muscle weakness (generalized)  (M62.81);Hemiplegia and hemiparesis;Other symptoms and signs involving cognitive function Hemiplegia - Right/Left: Left Hemiplegia - dominant/non-dominant: Non-Dominant Hemiplegia - caused by: Cerebral infarction   Activity Tolerance Patient tolerated treatment well   Patient Left in chair;with call bell/phone within reach;with chair alarm set   Nurse Communication Mobility status;Precautions        Time: [id number] OT Time Calculation (min): 16 min  Charges: OT General Charges $OT Visit: 1 Visit OT Treatments $Self Care/Home Management : 8-22 mins  Corinne Ports E. Jenesys Casseus, COTA/L Acute Rehabilitation Services [place] 01/06/2020, 9:58 AM

## 2020-01-06 NOTE — Progress Notes (Signed)
Courtney Heys, MD  Physician  Physical Medicine and Rehabilitation  Consult Note     Signed  Date of Service:  01/04/2020  5:08 AM      Related encounter: ED to Hosp-Admission (Discharged) from 01/01/2020 in [facility]      Signed      Expand AllCollapse All   Show:Clear all [x] Manual[x] Template[] Copied  Added by: [x] Angiulli, Lavon Paganini, PA-C[x] Courtney Heys, MD  [] Hover for details          Physical Medicine and Rehabilitation Consult Reason for Consult: Left side weakness Referring Physician: Dr. Leonie Man     HPI: Travis Salinas is a 69 y.o. right-handed male with history of COPD, hepatitis C, polysubstance abuse, hypertension.  Per chart review patient lives with friends.  Independent prior to admission.  1 level home with 3 flights of stairs.  Presented 01/01/2020 with gait instability, dizziness and left-sided weakness as well reported of numerous falls without loss of consciousness.  His blood pressure ranged from 110/62-102/68.  Cranial CT scan negative for acute changes.  Noted chronic small vessel changes of the white matter.  Patient did not receive TPA.  CT cervical spine negative for acute process.  MRI/MRA showed several small acute infarcts involving multiple vascular territories bilaterally suggesting a central source.  No proximal intracranial vessel occlusion or significant stenosis.  Echocardiogram with ejection fraction of [id number] grade 1 diastolic dysfunction.  Admission chemistries with WBC 14,800, BUN 41, creatinine 1.44, urine drug screen positive opiates cocaine and benzos, troponin high-sensitivity 585, CK 106..  Carotid Dopplers bilateral ICA stenosis 1/39%.  Cardiology services consulted for complex tachycardia with underlying bundle branch block and was placed on intravenous amiodarone as well as started on Coreg 3.125 mg twice daily..  Attempts at cardioversion were unsuccessful.  Currently maintained on aspirin for CVA prophylaxis.  Cleviprex  had been initiated for blood pressure monitoring.  Tolerating a regular diet.  Therapy evaluations completed with recommendations of physical medicine rehab consult.     Review of Systems  Constitutional: Negative for chills and fever.  HENT: Negative for hearing loss.   Eyes: Negative for blurred vision and double vision.  Respiratory: Negative for cough and shortness of breath.   Cardiovascular: Negative for chest pain, palpitations and leg swelling.  Gastrointestinal: Positive for constipation. Negative for heartburn, nausea and vomiting.  Genitourinary: Positive for urgency. Negative for dysuria, flank pain and hematuria.  Musculoskeletal: Positive for falls.  Skin: Negative for rash.  Neurological: Positive for dizziness, weakness and headaches.  Psychiatric/Behavioral: Positive for depression. The patient has insomnia.   All other systems reviewed and are negative.       Past Medical History:  Diagnosis Date  . Arthritis    . Cervical disc herniation    . COPD (chronic obstructive pulmonary disease) ([place])    . Headache      " due to antibiotics"  . Hepatitis C    . Hypertension           Past Surgical History:  Procedure Laterality Date  . APPLICATION OF A-CELL OF EXTREMITY Left 12/05/2016    Procedure: APPLICATION OF A-CELL OF EXTREMITY;  Surgeon: Loel Lofty Dillingham, DO;  Location: [place];  Service: Plastics;  Laterality: Left;  . APPLICATION OF WOUND VAC Left 12/05/2016    Procedure: APPLICATION OF WOUND VAC;  Surgeon: Loel Lofty Dillingham, DO;  Location: [place];  Service: Plastics;  Laterality: Left;  . I & D EXTREMITY Left 11/03/2016    Procedure:  IRRIGATION AND DEBRIDEMENT EXTREMITY;  Surgeon: Leandrew Koyanagi, MD;  Location: [place];  Service: Orthopedics;  Laterality: Left;  . I & D EXTREMITY Left 12/05/2016    Procedure: IRRIGATION AND DEBRIDEMENT EXTREMITY;  Surgeon: Loel Lofty Dillingham, DO;  Location: [place];  Service: Plastics;  Laterality: Left;  . INCISION AND DRAINAGE  ABSCESS Right 11/12/2013    Procedure: INCISION AND DRAINAGE AND OPEN PACKING OF RIGHT CALF  ABSCESS;  Surgeon: Earnstine Regal, MD;  Location: [facility];  Service: General;  Laterality: Right;  . INCISION AND DRAINAGE OF WOUND Left 01/29/2017    Procedure: IRRIGATION AND DEBRIDEMENT OF LEFT LEG WOUND WITH ABRA PLACEMENT AND PLACEMENT OF WOUND VAC;  Surgeon: Wallace Going, DO;  Location: [facility];  Service: Plastics;  Laterality: Left;  . KNEE SURGERY             Family History  Problem Relation Age of Onset  . Heart disease Mother    . Stroke Mother    . Heart disease Father    . Stroke Father      Social History:  reports that he quit smoking about 19 years ago. His smoking use included cigarettes. He has a 32.00 pack-year smoking history. He has never used smokeless tobacco. He reports current drug use. Drugs: Heroin and Cocaine. He reports that he does not drink alcohol. Allergies:       Allergies  Allergen Reactions  . Propoxyphene Nausea And Vomiting  . Amoxicillin Nausea And Vomiting  . Ciprofloxacin Nausea Only  . Depakote [Divalproex Sodium] Other (See Comments)      hallucinations          Medications Prior to Admission  Medication Sig Dispense Refill  . albuterol (PROVENTIL) (2.5 MG/3ML) 0.083% nebulizer solution Take 3 mLs (2.5 mg total) by nebulization every 2 (two) hours as needed for wheezing or shortness of breath. 75 mL 12  . albuterol (VENTOLIN HFA) 108 (90 Base) MCG/ACT inhaler Inhale 2 puffs into the lungs every 6 (six) hours as needed for wheezing or shortness of breath.      Marland Kitchen amantadine (SYMMETREL) 100 MG capsule Take 100 mg by mouth daily as needed (restless leg).       . ARIPiprazole (ABILIFY) 10 MG tablet Take 10 mg by mouth daily.      Marland Kitchen atenolol (TENORMIN) 25 MG tablet Take 25 mg by mouth daily.       . clonazePAM (KLONOPIN) 1 MG tablet Take 1 mg by mouth 2 (two) times daily.      Marland Kitchen FLUoxetine (PROZAC) 10 MG tablet Take 10 mg by mouth daily.      .  fluticasone (FLONASE) 50 MCG/ACT nasal spray Place 1 spray into both nostrils daily as needed for allergies.    0  . ipratropium-albuterol (DUONEB) 0.5-2.5 (3) MG/3ML SOLN Take 3 mLs by nebulization 3 (three) times daily. 360 mL    . lamoTRIgine (LAMICTAL) 200 MG tablet Take 1 tablet (200 mg total) by mouth every morning.      . Multiple Vitamins-Minerals (MULTIVITAMIN WITH MINERALS) tablet Take 1 tablet by mouth daily.      . OXYCONTIN 40 MG 12 hr tablet Take 40 mg by mouth 3 (three) times daily as needed (pain).       Marland Kitchen silodosin (RAPAFLO) 8 MG CAPS capsule Take 8 mg daily with breakfast by mouth.      . Tiotropium Bromide Monohydrate (SPIRIVA RESPIMAT) 2.5 MCG/ACT AERS Inhale 2 puffs into the lungs daily.      Marland Kitchen  umeclidinium-vilanterol (ANORO ELLIPTA) 62.5-25 MCG/INH AEPB Inhale 1 puff into the lungs daily. 60 each 0  . benzonatate (TESSALON) 100 MG capsule Take 2 capsules (200 mg total) by mouth 3 (three) times daily. (Patient not taking: Reported on 10/21/2019) 30 capsule 1  . feeding supplement, ENSURE ENLIVE, (ENSURE ENLIVE) LIQD Take 237 mLs by mouth 2 (two) times daily between meals. (Patient not taking: Reported on 01/02/2020) 237 mL 12  . ibuprofen (ADVIL,MOTRIN) 600 MG tablet Take 1 tablet (600 mg total) by mouth 4 (four) times daily. (Patient not taking: Reported on 01/02/2020) 30 tablet 0      Home: Home Living Family/patient expects to be discharged to:: Private residence Living Arrangements: Non-relatives/Friends([place]) Available Help at Discharge: Friend(s), Available 24 hours/day Type of Home: Other(Comment)(condo) Home Access: Stairs to enter [employer] of Steps: 3 flights Entrance Stairs-Rails: Right, Left Home Layout: One level Bathroom Shower/Tub: Multimedia programmer: Standard Home Equipment: Shower seat - built in  Functional History: Prior Function Level of Independence: Independent Functional Status:  Mobility: Bed Mobility Overal bed  mobility: Needs Assistance Bed Mobility: Supine to Sit, Sit to Supine Supine to sit: Min guard Sit to supine: Min guard General bed mobility comments: Coming up to EOB no assist needed, but L UE came from behind and was used as an afterthough when pt's attention was focused to the L hand.  Pt repositioned himself into supine without assist other than feet blocking. Transfers Overall transfer level: Needs assistance Transfers: Sit to/from Stand Sit to Stand: Min assist General transfer comment: stability assist Ambulation/Gait Ambulation/Gait assistance: Min assist, +2 physical assistance, +2 safety/equipment, Mod assist Gait Distance (Feet): 170 Feet Assistive device: IV Pole, 1 person hand held assist, Rolling walker (2 wheeled) Gait Pattern/deviations: Step-through pattern, Decreased step length - right, Decreased step length - left, Decreased stance time - left, Decreased stride length General Gait Details: pt's gait was mildly unsteady overall and degraded with longer distance.  gait was paretic with weak df clearance then degressed to foot flat contact and then to contact on forefoot without heel contact and trailing the R foot consistently.  Went from IV pole to [facility] with IV then RW as he degraded. Gait velocity: slower Gait velocity interpretation: <1.8 ft/sec, indicate of risk for recurrent falls   ADL: ADL Overall ADL's : Needs assistance/impaired Eating/Feeding: Minimal assistance, Bed level Grooming: Set up, Sitting, Wash/dry hands Upper Body Bathing: Minimal assistance, Sitting Lower Body Bathing: Moderate assistance, Sit to/from stand Upper Body Dressing : Minimal assistance, Sitting Lower Body Dressing: Moderate assistance, Sit to/from stand Toilet Transfer: Minimal assistance, Ambulation Toileting- Clothing Manipulation and Hygiene: Minimal assistance, Sit to/from stand Functional mobility during ADLs: Minimal assistance, +2 for safety/equipment General ADL Comments: pt  fatigues with ambulation and becomes more unsafe   Cognition: Cognition Overall Cognitive Status: Within Functional Limits for tasks assessed Orientation Level: Oriented X4 Cognition Arousal/Alertness: Awake/alert Behavior During Therapy: WFL for tasks assessed/performed Overall Cognitive Status: Within Functional Limits for tasks assessed Area of Impairment: Safety/judgement Safety/Judgement: Decreased awareness of safety, Decreased awareness of deficits   Blood pressure 99/61, pulse 72, temperature 98.2 F (36.8 C), temperature source Oral, resp. rate 19, height 5\' 11"  (1.803 m), weight 68 kg, SpO2 99 %. Physical Exam  Nursing note and vitals reviewed. Constitutional: He appears well-developed and well-nourished. No distress.  Sitting up in bed- SLP in room; perseverative; inappropriate about medical issues- wanted [place] to "not tell his wife he's doing cocaine so they can get back together", NAD;  focused on decrease in pain meds.   HENT:  Head: Normocephalic and atraumatic.  Nose: Nose normal.  Mouth/Throat: Oropharynx is clear and moist. No oropharyngeal exudate.  R tongue deviation  Eyes: Conjunctivae and EOM are normal. Right eye exhibits no discharge. Left eye exhibits no discharge.  Abrasion top of Nose on R No facial droop L nystagmus with EOMI B/L  Neck: No tracheal deviation present.  Cardiovascular: Normal rate and regular rhythm.  No murmur heard. Respiratory: Effort normal and breath sounds normal. No stridor. No respiratory distress. He has no wheezes. He has no rales.  GI: Soft. Bowel sounds are normal. He exhibits no distension. There is no abdominal tenderness. There is no rebound.  Musculoskeletal:     Cervical back: Normal range of motion and neck supple.     Comments: Tested RUE/LUE deltoid, biceps, triceps WE, grip and finger abd 5/5 LEs- HF, KE, KF, DF and PF tested 5/5 B/L   Neurological:  Patient is alert in no acute distress.  Follows commands.  Provides  his name and age.  Limited medical historian.  Normal sensation to light touch in all 4 extremities Finger to nose slowed, but intact B/L  Skin: Skin is warm and dry.  Psychiatric:  A little different responses- trying to hide drug use from wife, not [place]; focused on pain meds      Lab Results Last 24 Hours  Results for orders placed or performed during the hospital encounter of 01/01/20 (from the past 24 hour(s))  Glucose, capillary     Status: None    Collection Time: 01/03/20  6:20 AM  Result Value Ref Range    Glucose-Capillary 92 70 - 99 mg/dL    Comment 1 Notify RN    CK     Status: None    Collection Time: 01/03/20  7:18 AM  Result Value Ref Range    Total CK 106 49 - 397 U/L  MRSA PCR Screening     Status: None    Collection Time: 01/03/20 11:33 AM    Specimen: Nasal Mucosa; Nasopharyngeal  Result Value Ref Range    MRSA by PCR NEGATIVE NEGATIVE       Imaging Results (Last 48 hours)  CT HEAD WO CONTRAST   Result Date: 01/03/2020 CLINICAL DATA:  Stroke, post tPA EXAM: CT HEAD WITHOUT CONTRAST TECHNIQUE: Contiguous axial images were obtained from the base of the skull through the vertex without intravenous contrast. Sagittal and coronal MPR images reconstructed from axial data set. COMPARISON:  01/01/2020 FINDINGS: Brain: Generalized atrophy. Normal ventricular morphology. No midline shift or mass effect. Small vessel chronic ischemic changes of deep cerebral white matter. No intracranial hemorrhage, mass lesion, evidence of acute infarction, or extra-axial fluid collection. Vascular: Atherosclerotic calcifications of internal carotid and vertebral arteries at skull base. No hyperdense vessels. Skull: Intact Sinuses/Orbits: Large mucosal retention cyst of the LEFT maxillary sinus unchanged. Remaining paranasal sinuses and mastoid air cells clear Other: N/A IMPRESSION: Atrophy with small vessel chronic ischemic changes of deep cerebral white matter. No acute intracranial  abnormalities. Electronically Signed   By: Lavonia Dana M.D.   On: 01/03/2020 08:56    MR ANGIO HEAD WO CONTRAST   Result Date: 01/03/2020 CLINICAL DATA:  Stroke, follow-up EXAM: MRI HEAD WITHOUT CONTRAST MRA HEAD WITHOUT CONTRAST TECHNIQUE: Multiplanar, multiecho pulse sequences of the brain and surrounding structures were obtained without intravenous contrast. Angiographic images of the head were obtained using MRA technique without contrast. COMPARISON:  MRI brain 2014 FINDINGS:  MRI HEAD FINDINGS Brain: There is a small area restricted diffusion within the parasagittal right frontal lobe. Additional smaller areas restricted diffusion are present in bilateral frontal lobes, left parietal lobe, and bilateral cerebellar hemispheres. There is no evidence of intracranial hemorrhage. Prominence of the ventricles and sulci reflects generalized parenchymal volume loss. Patchy foci of T2 hyperintensity in the supratentorial white matter nonspecific but may reflect mild to moderate chronic microvascular ischemic changes. There is no intracranial mass or mass effect. There is no hydrocephalus or extra-axial fluid collection. Vascular: Major vessel flow voids at the skull base are preserved. Skull and upper cervical spine: Normal marrow signal is preserved. Sinuses/Orbits: Left maxillary sinus mucosal thickening. Orbits are unremarkable. Other: Sella is unremarkable.  Mastoid air cells are clear. MRA HEAD FINDINGS Intracranial internal carotid arteries are patent. Middle and anterior cerebral arteries are patent. Intracranial vertebral arteries, basilar artery, posterior cerebral arteries are patent. Right posterior communicating artery is present with fetal origin of the right PCA. There is no significant stenosis or aneurysm. IMPRESSION: Several small acute infarcts involving multiple vascular territories bilaterally suggesting a central source. No proximal intracranial vessel occlusion or significant stenosis. Chronic  microvascular ischemic changes. Electronically Signed   By: Macy Mis M.D.   On: 01/03/2020 15:20    MR BRAIN WO CONTRAST   Result Date: 01/03/2020 CLINICAL DATA:  Stroke, follow-up EXAM: MRI HEAD WITHOUT CONTRAST MRA HEAD WITHOUT CONTRAST TECHNIQUE: Multiplanar, multiecho pulse sequences of the brain and surrounding structures were obtained without intravenous contrast. Angiographic images of the head were obtained using MRA technique without contrast. COMPARISON:  MRI brain 2014 FINDINGS: MRI HEAD FINDINGS Brain: There is a small area restricted diffusion within the parasagittal right frontal lobe. Additional smaller areas restricted diffusion are present in bilateral frontal lobes, left parietal lobe, and bilateral cerebellar hemispheres. There is no evidence of intracranial hemorrhage. Prominence of the ventricles and sulci reflects generalized parenchymal volume loss. Patchy foci of T2 hyperintensity in the supratentorial white matter nonspecific but may reflect mild to moderate chronic microvascular ischemic changes. There is no intracranial mass or mass effect. There is no hydrocephalus or extra-axial fluid collection. Vascular: Major vessel flow voids at the skull base are preserved. Skull and upper cervical spine: Normal marrow signal is preserved. Sinuses/Orbits: Left maxillary sinus mucosal thickening. Orbits are unremarkable. Other: Sella is unremarkable.  Mastoid air cells are clear. MRA HEAD FINDINGS Intracranial internal carotid arteries are patent. Middle and anterior cerebral arteries are patent. Intracranial vertebral arteries, basilar artery, posterior cerebral arteries are patent. Right posterior communicating artery is present with fetal origin of the right PCA. There is no significant stenosis or aneurysm. IMPRESSION: Several small acute infarcts involving multiple vascular territories bilaterally suggesting a central source. No proximal intracranial vessel occlusion or significant  stenosis. Chronic microvascular ischemic changes. Electronically Signed   By: Macy Mis M.D.   On: 01/03/2020 15:20    ECHOCARDIOGRAM COMPLETE   Result Date: 01/02/2020    ECHOCARDIOGRAM REPORT   Patient Name:   ROARKE DUTKIEWICZ ST CLAIR Date of Exam: 01/02/2020 Medical Rec #:  [id number]          Height:       71.0 in Accession #:    [id number]         Weight:       150.0 lb Date of Birth:  Feb 23, 1951          BSA:          1.866 m Patient Age:  68 years           BP:           112/91 mmHg Patient Gender: M                  HR:           81 bpm. Exam Location:  Inpatient Procedure: 2D Echo, Color Doppler and Cardiac Doppler                                MODIFIED REPORT:   This report was modified by Candee Furbish MD on 01/02/2020 due to [facility] comment                                     added.  Indications:     Stroke 434.91/II63.9  History:         Patient has prior history of Echocardiogram examinations, most                  recent 05/31/2021.  Sonographer:     Merrie Roof RDCS Referring Phys:  [id number] ERIC LINDZEN Diagnosing Phys: Candee Furbish MD IMPRESSIONS  1. Left ventricular ejection fraction, by estimation, is 35 to 40%. The left ventricle has moderately decreased function. The left ventricle demonstrates regional wall motion abnormalities (see scoring diagram/findings for description). There is moderate left ventricular hypertrophy. Left ventricular diastolic parameters are consistent with Grade I diastolic dysfunction (impaired relaxation). Has Takotsubo like appearance (stress induced cardiomyopathy).  2. Right ventricular systolic function is normal. The right ventricular size is normal.  3. The mitral valve is normal in structure. Trivial mitral valve regurgitation. No evidence of mitral stenosis.  4. The aortic valve is normal in structure. Aortic valve regurgitation is not visualized. No aortic stenosis is present.  5. The inferior vena cava is normal in size with greater than 50% respiratory variability,  suggesting right atrial pressure of 3 mmHg. Comparison(s): The left ventricular function is worsened. LV function appeared normal in 2018. Conclusion(s)/Recommendation(s): No intracardiac source of embolism detected on this transthoracic study. A transesophageal echocardiogram is recommended to exclude cardiac source of embolism if clinically indicated. FINDINGS  Left Ventricle: Left ventricular ejection fraction, by estimation, is 35 to 40%. The left ventricle has moderately decreased function. The left ventricle demonstrates regional wall motion abnormalities. The left ventricular internal cavity size was normal in size. There is moderate left ventricular hypertrophy. Left ventricular diastolic parameters are consistent with Grade I diastolic dysfunction (impaired relaxation).  LV Wall Scoring: The entire apex is akinetic. Has Takotsubo like appearance (stress induced cardiomyopathy). Right Ventricle: The right ventricular size is normal. No increase in right ventricular wall thickness. Right ventricular systolic function is normal. Left Atrium: Left atrial size was normal in size. Right Atrium: Right atrial size was normal in size. Pericardium: There is no evidence of pericardial effusion. Mitral Valve: The mitral valve is normal in structure. Normal mobility of the mitral valve leaflets. Trivial mitral valve regurgitation. No evidence of mitral valve stenosis. Tricuspid Valve: The tricuspid valve is normal in structure. Tricuspid valve regurgitation is not demonstrated. No evidence of tricuspid stenosis. Aortic Valve: The aortic valve is normal in structure. Aortic valve regurgitation is not visualized. No aortic stenosis is present. Pulmonic Valve: The pulmonic valve was normal in structure. Pulmonic valve regurgitation is mild. No evidence of pulmonic stenosis.  Aorta: The aortic root is normal in size and structure. Venous: The inferior vena cava is normal in size with greater than 50% respiratory variability,  suggesting right atrial pressure of 3 mmHg. IAS/Shunts: No atrial level shunt detected by color flow Doppler.  LEFT VENTRICLE PLAX 2D LVIDd:         4.60 cm     Diastology LVIDs:         3.00 cm     LV e' lateral:   5.55 cm/s LV PW:         1.50 cm     LV E/e' lateral: 9.6 LV IVS:        1.30 cm     LV e' medial:    6.64 cm/s LVOT diam:     2.10 cm     LV E/e' medial:  8.0 LV SV:         39 LV SV Index:   21 LVOT Area:     3.46 cm  LV Volumes (MOD) LV vol d, MOD A2C: 69.3 ml LV vol d, MOD A4C: 76.4 ml LV vol s, MOD A2C: 46.7 ml LV vol s, MOD A4C: 52.7 ml LV SV MOD A2C:     22.6 ml LV SV MOD A4C:     76.4 ml LV SV MOD BP:      23.3 ml RIGHT VENTRICLE             IVC RV Basal diam:  3.10 cm     IVC diam: 1.50 cm RV S prime:     11.90 cm/s TAPSE (M-mode): 2.0 cm LEFT ATRIUM             Index       RIGHT ATRIUM           Index LA diam:        2.70 cm 1.45 cm/m  RA Area:     13.50 cm LA Vol (A2C):   43.4 ml 23.26 ml/m RA Volume:   30.40 ml  16.29 ml/m LA Vol (A4C):   34.8 ml 18.65 ml/m LA Biplane Vol: 40.8 ml 21.87 ml/m  AORTIC VALVE LVOT Vmax:   62.00 cm/s LVOT Vmean:  44.500 cm/s LVOT VTI:    0.112 m  AORTA Ao Root diam: 3.70 cm Ao Asc diam:  3.30 cm MITRAL VALVE MV Area (PHT): 3.54 cm    SHUNTS MV Decel Time: 214 msec    Systemic VTI:  0.11 m MV E velocity: 53.10 cm/s  Systemic Diam: 2.10 cm MV A velocity: 63.80 cm/s MV E/A ratio:  0.83 Candee Furbish MD Electronically signed by Candee Furbish MD Signature Date/Time: 01/02/2020/3:35:22 PM    Final (Updated)     CAROTID (at [facility] and [facility] only)   Result Date: 01/03/2020 Carotid Arterial Duplex Study Indications:       Weakness and New onset of gait instability. Risk Factors:      Hypertension. Other Factors:     Cervical disc herniation. Limitations        Today's exam was limited due to movement, body habitus. Comparison Study:  No prior study on file Performing Technologist: Sharion Dove RVS  Examination Guidelines: A complete evaluation includes B-mode imaging,  spectral Doppler, color Doppler, and power Doppler as needed of all accessible portions of each vessel. Bilateral testing is considered an integral part of a complete examination. Limited examinations for reoccurring indications may be performed as noted.  Right Carotid Findings: +----------+--------+--------+--------+------------------+------------------+  PSV cm/sEDV cm/sStenosisPlaque DescriptionComments           +----------+--------+--------+--------+------------------+------------------+ CCA Prox  61      8                                 intimal thickening +----------+--------+--------+--------+------------------+------------------+ CCA Distal43      9                                 intimal thickening +----------+--------+--------+--------+------------------+------------------+ ICA Prox  50      13                                                   +----------+--------+--------+--------+------------------+------------------+ ICA Distal84      27                                                   +----------+--------+--------+--------+------------------+------------------+ ECA       119     17                                                   +----------+--------+--------+--------+------------------+------------------+ +----------+--------+-------+--------+-------------------+           PSV cm/sEDV cmsDescribeArm Pressure (mmHG) +----------+--------+-------+--------+-------------------+ [id number]                                         +----------+--------+-------+--------+-------------------+ +---------+--------+--------+--------------+ VertebralPSV cm/sEDV cm/sNot identified +---------+--------+--------+--------------+  Left Carotid Findings: +----------+--------+--------+--------+------------------+------------------+           PSV cm/sEDV cm/sStenosisPlaque DescriptionComments            +----------+--------+--------+--------+------------------+------------------+ CCA Prox  54      8                                 intimal thickening +----------+--------+--------+--------+------------------+------------------+ CCA Distal50      11                                intimal thickening +----------+--------+--------+--------+------------------+------------------+ ICA Prox  32      9                                                    +----------+--------+--------+--------+------------------+------------------+ ICA Distal45      8                                                    +----------+--------+--------+--------+------------------+------------------+ ECA       74  9                                                    +----------+--------+--------+--------+------------------+------------------+ +----------+--------+--------+--------+-------------------+           PSV cm/sEDV cm/sDescribeArm Pressure (mmHG) +----------+--------+--------+--------+-------------------+ [id number]                                         +----------+--------+--------+--------+-------------------+ +---------+--------+--+--------+-+ VertebralPSV cm/s28EDV cm/s8 +---------+--------+--+--------+-+   Summary: Right Carotid: The extracranial vessels were near-normal with only minimal wall                thickening or plaque. Left Carotid: The extracranial vessels were near-normal with only minimal wall               thickening or plaque. Vertebrals:  Left vertebral artery demonstrates antegrade flow. Bilateral              vertebral arteries were not visualized. Subclavians: Normal flow hemodynamics were seen in bilateral subclavian              arteries. *See table(s) above for measurements and observations.  Electronically signed by Antony Contras MD on 01/03/2020 at 1:02:01 PM.    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 Does the need for close, 24 hr/day medical supervision in concert with the patient's rehab needs make it unreasonable for this patient to be served in a less intensive setting? Potentially 2. Co-Morbidities requiring supervision/potential complications: strokes, HTN, hep C, COPD, cocaine abuse, heroin abuse, cocaine cardiomyopathy 3. Due to bowel management, safety, skin/wound care, disease management, medication administration, pain management and patient education, does the patient require 24 hr/day rehab nursing? Potentially 4. Does the patient require coordinated care of a physician, rehab nurse, therapy disciplines of PT, OT and SLP  to address physical and functional deficits in the context of the above medical diagnosis(es)? Potentially Addressing deficits in the following areas: balance, endurance, locomotion, transferring, bathing, dressing, grooming, toileting and cognition 5. Can the patient actively participate in an intensive therapy program of at least 3 hrs of therapy per day at least 5 days per week? Potentially 6. The potential for patient to make measurable gains while on inpatient rehab is good and fair 7. Anticipated functional outcomes upon discharge from inpatient rehab are modified independent, supervision and min assist  with PT, modified independent, supervision and min assist with OT, supervision and min assist with SLP. 8. Estimated rehab length of stay to reach the above functional goals is: not clear- not sure about dispo?? 9. Anticipated discharge destination: Home 10. Overall Rehab/Functional Prognosis: good and fair   RECOMMENDATIONS: This patient's condition is appropriate for continued rehabilitative care in the following setting: CIR and SNF Patient has agreed to participate in recommended program. Potentially Note that insurance prior authorization may be required for reimbursement for recommended care.   Comment:  1. Pt's focus is on receiving  pain meds- he sees nothing wrong since stroke, just trying to get more meds as stated above.  2. Not sure about dispo- has a friend, but wife and he are separated/divorced- need to check into this.  3. High level, based on exam 4. Will  d/w admissions coordinators 5. Thank you for this consult.      Lavon Paganini Angiulli, PA-C 01/04/2020    I have personally performed a face to face diagnostic evaluation of this patient and formulated the key components of the plan.  Additionally, I have personally reviewed laboratory data, imaging studies, as well as relevant notes and concur with the physician assistant's documentation above.          Revision History                     Routing History

## 2020-01-06 NOTE — Discharge Summary (Addendum)
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 Cocaine abuse HTN Ventricular arrythmia Takotsubo syndrome COPD CKD3 Active Problems:   Stroke (cerebrum) ([place])   Medications to be continued on Rehab Allergies as of 01/06/2020      Reactions   Propoxyphene Nausea And Vomiting   Amoxicillin Nausea And Vomiting   Ciprofloxacin Nausea Only   Depakote [divalproex Sodium] Other (See Comments)   hallucinations      Medication List    STOP taking these medications   atenolol 25 MG tablet Commonly known as: TENORMIN   benzonatate 100 MG capsule Commonly known as: TESSALON   feeding supplement (ENSURE ENLIVE) Liqd     TAKE these medications    stroke: mapping our early stages of recovery book Misc 1 each by Does not apply route once for 1 dose.   acetaminophen 325 MG tablet Commonly known as: TYLENOL Take 2 tablets (650 mg total) by mouth every 4 (four) hours as needed for mild pain (or temp > 37.5 C (99.5 F)).   albuterol 108 (90 Base) MCG/ACT inhaler Commonly known as: VENTOLIN HFA Inhale 2 puffs into the lungs every 6 (six) hours as needed for wheezing or shortness of breath.   albuterol (2.5 MG/3ML) 0.083% nebulizer solution Commonly known as: PROVENTIL Take 3 mLs (2.5 mg total) by nebulization every 2 (two) hours as needed for wheezing or shortness of breath.   amantadine 100 MG capsule Commonly known as: SYMMETREL Take 100 mg by mouth daily as needed (restless leg).   amiodarone 200 MG tablet Commonly known as: PACERONE Take 1 tablet (200 mg total) by mouth 2 (two) times daily.   Anoro Ellipta 62.5-25 MCG/INH Aepb Generic  drug: umeclidinium-vilanterol Inhale 1 puff into the lungs daily.   ARIPiprazole 10 MG tablet Commonly known as: ABILIFY Take 10 mg by mouth daily.   aspirin 81 MG EC tablet Take 1 tablet (81 mg total) by mouth daily. Start taking on: January 07, 2020   atorvastatin 40 MG tablet Commonly known as: LIPITOR Take 1 tablet (40 mg total) by mouth daily. Start taking on: January 07, 2020   carvedilol 3.125 MG tablet Commonly known as: COREG Take 1 tablet (3.125 mg total) by mouth 2 (two) times daily with a meal.   clonazePAM 1 MG tablet Commonly known as: KLONOPIN Take 1 mg by mouth 2 (two) times daily.   clopidogrel 75 MG tablet Commonly known as: PLAVIX Take 1 tablet (75 mg total) by mouth daily. Start taking on: January 07, 2020   FLUoxetine 10 MG tablet Commonly known as: PROZAC Take 10 mg by mouth daily.   fluticasone 50 MCG/ACT nasal spray Commonly known as: FLONASE Place 1 spray into both nostrils daily as needed for allergies.   ibuprofen 600 MG tablet Commonly known as: ADVIL Take 1 tablet (600 mg total) by mouth 4 (four) times daily.   ipratropium-albuterol 0.5-2.5 (3) MG/3ML Soln Commonly known as: DUONEB Take 3 mLs by nebulization 3 (three) times daily.   lamoTRIgine 200 MG tablet Commonly known as: LAMICTAL Take 1 tablet (200 mg total) by mouth every morning.   multivitamin with minerals tablet Take 1  tablet by mouth daily.   OxyCONTIN 40 mg 12 hr tablet Generic drug: oxyCODONE Take 40 mg by mouth 3 (three) times daily as needed (pain).   silodosin 8 MG Caps capsule Commonly known as: RAPAFLO Take 8 mg daily with breakfast by mouth.   Spiriva Respimat 2.5 MCG/ACT Aers Generic drug: Tiotropium Bromide Monohydrate Inhale 2 puffs into the lungs daily.       LABORATORY STUDIES CBC    Component Value Date/Time   WBC 13.5 (H) 01/02/2020 1934   RBC 5.13 01/02/2020 1934   HGB 13.8 01/02/2020 1934   HCT 44.4 01/02/2020 1934   PLT 284 01/02/2020 1934    MCV 86.5 01/02/2020 1934   MCH 26.9 01/02/2020 1934   MCHC 31.1 01/02/2020 1934   RDW 13.2 01/02/2020 1934   LYMPHSABS 3.3 01/01/2020 2210   MONOABS 1.7 (H) 01/01/2020 2210   EOSABS 0.1 01/01/2020 2210   BASOSABS 0.1 01/01/2020 2210   CMP    Component Value Date/Time   NA 140 01/02/2020 1934   K 3.6 01/02/2020 1934   CL 107 01/02/2020 1934   CO2 20 (L) 01/02/2020 1934   GLUCOSE 124 (H) 01/02/2020 1934   BUN 29 (H) 01/02/2020 1934   CREATININE 1.31 (H) 01/02/2020 1934   CREATININE 1.22 11/25/2016 1543   CALCIUM 9.1 01/02/2020 1934   PROT 7.3 01/01/2020 2210   ALBUMIN 4.4 01/01/2020 2210   AST 58 (H) 01/01/2020 2210   ALT 36 01/01/2020 2210   ALKPHOS 106 01/01/2020 2210   BILITOT 1.3 (H) 01/01/2020 2210   GFRNONAA 56 (L) 01/02/2020 1934   GFRNONAA 71 04/23/2016 1638   GFRAA >60 01/02/2020 1934   GFRAA 82 04/23/2016 1638   COAGS Lab Results  Component Value Date   INR 0.98 06/14/2017   INR 1.03 01/26/2017   INR 1.02 11/03/2016   Lipid Panel    Component Value Date/Time   CHOL 151 01/02/2020 0329   TRIG 97 01/02/2020 0329   HDL 45 01/02/2020 0329   CHOLHDL 3.4 01/02/2020 0329   VLDL 19 01/02/2020 0329   LDLCALC 87 01/02/2020 0329   HgbA1C  Lab Results  Component Value Date   HGBA1C 5.2 01/02/2020   Urinalysis    Component Value Date/Time   COLORURINE YELLOW 06/14/2017 1211   APPEARANCEUR CLEAR 06/14/2017 1211   LABSPEC 1.014 06/14/2017 1211   PHURINE 6.0 06/14/2017 1211   GLUCOSEU NEGATIVE 06/14/2017 1211   HGBUR NEGATIVE 06/14/2017 1211   BILIRUBINUR NEGATIVE 06/14/2017 1211   KETONESUR NEGATIVE 06/14/2017 1211   PROTEINUR NEGATIVE 06/14/2017 1211   UROBILINOGEN 1.0 11/14/2013 1424   NITRITE NEGATIVE 06/14/2017 1211   LEUKOCYTESUR NEGATIVE 06/14/2017 1211   Urine Drug Screen     Component Value Date/Time   LABOPIA POSITIVE (A) 01/02/2020 0544   COCAINSCRNUR POSITIVE (A) 01/02/2020 0544   LABBENZ POSITIVE (A) 01/02/2020 0544   AMPHETMU NONE  DETECTED 01/02/2020 0544   THCU NONE DETECTED 01/02/2020 0544   LABBARB NONE DETECTED 01/02/2020 0544    Alcohol Level    Component Value Date/Time   [facility]  05/10/2010 1230    <5        LOWEST DETECTABLE LIMIT FOR SERUM ALCOHOL IS 5 mg/dL FOR MEDICAL PURPOSES ONLY     SIGNIFICANT DIAGNOSTIC STUDIES CT HEAD WO CONTRAST  Result Date: 01/03/2020 CLINICAL DATA:  Stroke, post tPA EXAM: CT HEAD WITHOUT CONTRAST TECHNIQUE: Contiguous axial images were obtained from the base of the skull through the vertex without intravenous contrast. Sagittal and coronal  MPR images reconstructed from axial data set. COMPARISON:  01/01/2020 FINDINGS: Brain: Generalized atrophy. Normal ventricular morphology. No midline shift or mass effect. Small vessel chronic ischemic changes of deep cerebral white matter. No intracranial hemorrhage, mass lesion, evidence of acute infarction, or extra-axial fluid collection. Vascular: Atherosclerotic calcifications of internal carotid and vertebral arteries at skull base. No hyperdense vessels. Skull: Intact Sinuses/Orbits: Large mucosal retention cyst of the LEFT maxillary sinus unchanged. Remaining paranasal sinuses and mastoid air cells clear Other: N/A IMPRESSION: Atrophy with small vessel chronic ischemic changes of deep cerebral white matter. No acute intracranial abnormalities. Electronically Signed   By: Lavonia Dana M.D.   On: 01/03/2020 08:56   CT Cervical Spine Wo Contrast  Result Date: 01/01/2020 CLINICAL DATA:  Status post trauma. EXAM: CT CERVICAL SPINE WITHOUT CONTRAST TECHNIQUE: Multidetector CT imaging of the cervical spine was performed without intravenous contrast. Multiplanar CT image reconstructions were also generated. COMPARISON:  None. FINDINGS: Alignment: Normal. Skull base and vertebrae: No acute fracture. No primary bone lesion or focal pathologic process. Soft tissues and spinal canal: No prevertebral fluid or swelling. No visible canal hematoma. Disc  levels: Moderate to marked severity endplate sclerosis is seen at the levels of C5-C6 and C6-C7 with mild endplate sclerosis seen throughout the remainder of the cervical spine. Moderate to marked severity intervertebral disc space narrowing is seen at the levels of C5-C6 and C6-C7. Marked severity bilateral multilevel facet joint hypertrophy is seen. Upper chest: Moderate severity emphysematous lung disease is seen involving the bilateral upper lobes. Other: None. IMPRESSION: 1. No acute osseous abnormality of the cervical spine. 2. Moderate to marked severity multilevel degenerative changes, most prominent at the levels of C5-C6 and C6-C7. 3. Moderate severity emphysematous lung disease involving the bilateral upper lobes. Emphysema (ICD10-J43.9). Electronically Signed   By: Virgina Norfolk M.D.   On: 01/01/2020 21:51   MR ANGIO HEAD WO CONTRAST  Result Date: 01/03/2020 CLINICAL DATA:  Stroke, follow-up EXAM: MRI HEAD WITHOUT CONTRAST MRA HEAD WITHOUT CONTRAST TECHNIQUE: Multiplanar, multiecho pulse sequences of the brain and surrounding structures were obtained without intravenous contrast. Angiographic images of the head were obtained using MRA technique without contrast. COMPARISON:  MRI brain 2014 FINDINGS: MRI HEAD FINDINGS Brain: There is a small area restricted diffusion within the parasagittal right frontal lobe. Additional smaller areas restricted diffusion are present in bilateral frontal lobes, left parietal lobe, and bilateral cerebellar hemispheres. There is no evidence of intracranial hemorrhage. Prominence of the ventricles and sulci reflects generalized parenchymal volume loss. Patchy foci of T2 hyperintensity in the supratentorial white matter nonspecific but may reflect mild to moderate chronic microvascular ischemic changes. There is no intracranial mass or mass effect. There is no hydrocephalus or extra-axial fluid collection. Vascular: Major vessel flow voids at the skull base are  preserved. Skull and upper cervical spine: Normal marrow signal is preserved. Sinuses/Orbits: Left maxillary sinus mucosal thickening. Orbits are unremarkable. Other: Sella is unremarkable.  Mastoid air cells are clear. MRA HEAD FINDINGS Intracranial internal carotid arteries are patent. Middle and anterior cerebral arteries are patent. Intracranial vertebral arteries, basilar artery, posterior cerebral arteries are patent. Right posterior communicating artery is present with fetal origin of the right PCA. There is no significant stenosis or aneurysm. IMPRESSION: Several small acute infarcts involving multiple vascular territories bilaterally suggesting a central source. No proximal intracranial vessel occlusion or significant stenosis. Chronic microvascular ischemic changes. Electronically Signed   By: Macy Mis M.D.   On: 01/03/2020 15:20   MR BRAIN WO  CONTRAST  Result Date: 01/03/2020 CLINICAL DATA:  Stroke, follow-up EXAM: MRI HEAD WITHOUT CONTRAST MRA HEAD WITHOUT CONTRAST TECHNIQUE: Multiplanar, multiecho pulse sequences of the brain and surrounding structures were obtained without intravenous contrast. Angiographic images of the head were obtained using MRA technique without contrast. COMPARISON:  MRI brain 2014 FINDINGS: MRI HEAD FINDINGS Brain: There is a small area restricted diffusion within the parasagittal right frontal lobe. Additional smaller areas restricted diffusion are present in bilateral frontal lobes, left parietal lobe, and bilateral cerebellar hemispheres. There is no evidence of intracranial hemorrhage. Prominence of the ventricles and sulci reflects generalized parenchymal volume loss. Patchy foci of T2 hyperintensity in the supratentorial white matter nonspecific but may reflect mild to moderate chronic microvascular ischemic changes. There is no intracranial mass or mass effect. There is no hydrocephalus or extra-axial fluid collection. Vascular: Major vessel flow voids at the  skull base are preserved. Skull and upper cervical spine: Normal marrow signal is preserved. Sinuses/Orbits: Left maxillary sinus mucosal thickening. Orbits are unremarkable. Other: Sella is unremarkable.  Mastoid air cells are clear. MRA HEAD FINDINGS Intracranial internal carotid arteries are patent. Middle and anterior cerebral arteries are patent. Intracranial vertebral arteries, basilar artery, posterior cerebral arteries are patent. Right posterior communicating artery is present with fetal origin of the right PCA. There is no significant stenosis or aneurysm. IMPRESSION: Several small acute infarcts involving multiple vascular territories bilaterally suggesting a central source. No proximal intracranial vessel occlusion or significant stenosis. Chronic microvascular ischemic changes. Electronically Signed   By: Macy Mis M.D.   On: 01/03/2020 15:20   ECHOCARDIOGRAM COMPLETE  Result Date: 01/02/2020    ECHOCARDIOGRAM REPORT   Patient Name:   Travis Salinas Date of Exam: 01/02/2020 Medical Rec #:  [id number]          Height:       71.0 in Accession #:    [id number]         Weight:       150.0 lb Date of Birth:  08-14-1951          BSA:          1.866 m Patient Age:    60 years           BP:           112/91 mmHg Patient Gender: M                  HR:           81 bpm. Exam Location:  Inpatient Procedure: 2D Echo, Color Doppler and Cardiac Doppler                                MODIFIED REPORT:   This report was modified by Candee Furbish MD on 01/02/2020 due to [facility] comment                                     added.  Indications:     Stroke 434.91/II63.9  History:         Patient has prior history of Echocardiogram examinations, most                  recent 05/31/2021.  Sonographer:     Merrie Roof RDCS Referring Phys:  [id number] ERIC LINDZEN Diagnosing Phys: Candee Furbish MD IMPRESSIONS  1. Left ventricular  ejection fraction, by estimation, is 35 to 40%. The left ventricle has moderately decreased function. The  left ventricle demonstrates regional wall motion abnormalities (see scoring diagram/findings for description). There is moderate left ventricular hypertrophy. Left ventricular diastolic parameters are consistent with Grade I diastolic dysfunction (impaired relaxation). Has Takotsubo like appearance (stress induced cardiomyopathy).  2. Right ventricular systolic function is normal. The right ventricular size is normal.  3. The mitral valve is normal in structure. Trivial mitral valve regurgitation. No evidence of mitral stenosis.  4. The aortic valve is normal in structure. Aortic valve regurgitation is not visualized. No aortic stenosis is present.  5. The inferior vena cava is normal in size with greater than 50% respiratory variability, suggesting right atrial pressure of 3 mmHg. Comparison(s): The left ventricular function is worsened. LV function appeared normal in 2018. Conclusion(s)/Recommendation(s): No intracardiac source of embolism detected on this transthoracic study. A transesophageal echocardiogram is recommended to exclude cardiac source of embolism if clinically indicated. FINDINGS  Left Ventricle: Left ventricular ejection fraction, by estimation, is 35 to 40%. The left ventricle has moderately decreased function. The left ventricle demonstrates regional wall motion abnormalities. The left ventricular internal cavity size was normal in size. There is moderate left ventricular hypertrophy. Left ventricular diastolic parameters are consistent with Grade I diastolic dysfunction (impaired relaxation).  LV Wall Scoring: The entire apex is akinetic. Has Takotsubo like appearance (stress induced cardiomyopathy). Right Ventricle: The right ventricular size is normal. No increase in right ventricular wall thickness. Right ventricular systolic function is normal. Left Atrium: Left atrial size was normal in size. Right Atrium: Right atrial size was normal in size. Pericardium: There is no evidence of  pericardial effusion. Mitral Valve: The mitral valve is normal in structure. Normal mobility of the mitral valve leaflets. Trivial mitral valve regurgitation. No evidence of mitral valve stenosis. Tricuspid Valve: The tricuspid valve is normal in structure. Tricuspid valve regurgitation is not demonstrated. No evidence of tricuspid stenosis. Aortic Valve: The aortic valve is normal in structure. Aortic valve regurgitation is not visualized. No aortic stenosis is present. Pulmonic Valve: The pulmonic valve was normal in structure. Pulmonic valve regurgitation is mild. No evidence of pulmonic stenosis. Aorta: The aortic root is normal in size and structure. Venous: The inferior vena cava is normal in size with greater than 50% respiratory variability, suggesting right atrial pressure of 3 mmHg. IAS/Shunts: No atrial level shunt detected by color flow Doppler.  LEFT VENTRICLE PLAX 2D LVIDd:         4.60 cm     Diastology LVIDs:         3.00 cm     LV e' lateral:   5.55 cm/s LV PW:         1.50 cm     LV E/e' lateral: 9.6 LV IVS:        1.30 cm     LV e' medial:    6.64 cm/s LVOT diam:     2.10 cm     LV E/e' medial:  8.0 LV SV:         39 LV SV Index:   21 LVOT Area:     3.46 cm  LV Volumes (MOD) LV vol d, MOD A2C: 69.3 ml LV vol d, MOD A4C: 76.4 ml LV vol s, MOD A2C: 46.7 ml LV vol s, MOD A4C: 52.7 ml LV SV MOD A2C:     22.6 ml LV SV MOD A4C:     76.4 ml LV SV MOD  BP:      23.3 ml RIGHT VENTRICLE             IVC RV Basal diam:  3.10 cm     IVC diam: 1.50 cm RV S prime:     11.90 cm/s TAPSE (M-mode): 2.0 cm LEFT ATRIUM             Index       RIGHT ATRIUM           Index LA diam:        2.70 cm 1.45 cm/m  RA Area:     13.50 cm LA Vol (A2C):   43.4 ml 23.26 ml/m RA Volume:   30.40 ml  16.29 ml/m LA Vol (A4C):   34.8 ml 18.65 ml/m LA Biplane Vol: 40.8 ml 21.87 ml/m  AORTIC VALVE LVOT Vmax:   62.00 cm/s LVOT Vmean:  44.500 cm/s LVOT VTI:    0.112 m  AORTA Ao Root diam: 3.70 cm Ao Asc diam:  3.30 cm MITRAL VALVE MV  Area (PHT): 3.54 cm    SHUNTS MV Decel Time: 214 msec    Systemic VTI:  0.11 m MV E velocity: 53.10 cm/s  Systemic Diam: 2.10 cm MV A velocity: 63.80 cm/s MV E/A ratio:  0.83 Candee Furbish MD Electronically signed by Candee Furbish MD Signature Date/Time: 01/02/2020/3:35:22 PM    Final (Updated)    CT HEAD CODE STROKE WO CONTRAST  Result Date: 01/01/2020 CLINICAL DATA:  Code stroke.  Left-sided weakness. EXAM: CT HEAD WITHOUT CONTRAST TECHNIQUE: Contiguous axial images were obtained from the base of the skull through the vertex without intravenous contrast. COMPARISON:  MRI 07/12/2013 FINDINGS: Brain: Mild generalized atrophy. Chronic small-vessel ischemic changes of the white matter. No sign of acute infarction, mass lesion, hemorrhage, hydrocephalus or extra-axial collection. Vascular: There is atherosclerotic calcification of the major vessels at the base of the brain. Skull: Normal Sinuses/Orbits: Clear/normal. Insignificant retention cyst left maxillary sinus. Other: None ASPECTS ([place] Stroke Program Early CT Score) - Ganglionic level infarction (caudate, lentiform nuclei, internal capsule, insula, M1-M3 cortex): 7 - Supraganglionic infarction (M4-M6 cortex): 3 Total score (0-10 with 10 being normal): 10 IMPRESSION: 1. No acute head CT finding. Chronic small-vessel changes of the white matter. 2. ASPECTS is 10 3. These results were communicated to Dr. Cheral Marker at 9:36 pmon 4/24/2021by text page via the [facility] messaging system. Electronically Signed   By: Nelson Chimes M.D.   On: 01/01/2020 21:37   CAROTID (at [facility] and [facility] only)  Result Date: 01/03/2020 Carotid Arterial Duplex Study Indications:       Weakness and New onset of gait instability. Risk Factors:      Hypertension. Other Factors:     Cervical disc herniation. Limitations        Today's exam was limited due to movement, body habitus. Comparison Study:  No prior study on file Performing Technologist: Sharion Dove RVS  Examination Guidelines: A  complete evaluation includes B-mode imaging, spectral Doppler, color Doppler, and power Doppler as needed of all accessible portions of each vessel. Bilateral testing is considered an integral part of a complete examination. Limited examinations for reoccurring indications may be performed as noted.  Right Carotid Findings: +----------+--------+--------+--------+------------------+------------------+           PSV cm/sEDV cm/sStenosisPlaque DescriptionComments           +----------+--------+--------+--------+------------------+------------------+ CCA Prox  61      8  intimal thickening +----------+--------+--------+--------+------------------+------------------+ CCA Distal43      9                                 intimal thickening +----------+--------+--------+--------+------------------+------------------+ ICA Prox  50      13                                                   +----------+--------+--------+--------+------------------+------------------+ ICA Distal84      27                                                   +----------+--------+--------+--------+------------------+------------------+ ECA       119     17                                                   +----------+--------+--------+--------+------------------+------------------+ +----------+--------+-------+--------+-------------------+           PSV cm/sEDV cmsDescribeArm Pressure (mmHG) +----------+--------+-------+--------+-------------------+ [id number]                                         +----------+--------+-------+--------+-------------------+ +---------+--------+--------+--------------+ VertebralPSV cm/sEDV cm/sNot identified +---------+--------+--------+--------------+  Left Carotid Findings: +----------+--------+--------+--------+------------------+------------------+           PSV cm/sEDV cm/sStenosisPlaque DescriptionComments            +----------+--------+--------+--------+------------------+------------------+ CCA Prox  54      8                                 intimal thickening +----------+--------+--------+--------+------------------+------------------+ CCA Distal50      11                                intimal thickening +----------+--------+--------+--------+------------------+------------------+ ICA Prox  32      9                                                    +----------+--------+--------+--------+------------------+------------------+ ICA Distal45      8                                                    +----------+--------+--------+--------+------------------+------------------+ ECA       74      9                                                    +----------+--------+--------+--------+------------------+------------------+ +----------+--------+--------+--------+-------------------+  PSV cm/sEDV cm/sDescribeArm Pressure (mmHG) +----------+--------+--------+--------+-------------------+ [id number]                                         +----------+--------+--------+--------+-------------------+ +---------+--------+--+--------+-+ VertebralPSV cm/s28EDV cm/s8 +---------+--------+--+--------+-+   Summary: Right Carotid: The extracranial vessels were near-normal with only minimal wall                thickening or plaque. Left Carotid: The extracranial vessels were near-normal with only minimal wall               thickening or plaque. Vertebrals:  Left vertebral artery demonstrates antegrade flow. Bilateral              vertebral arteries were not visualized. Subclavians: Normal flow hemodynamics were seen in bilateral subclavian              arteries. *See table(s) above for measurements and observations.  Electronically signed by Antony Contras MD on 01/03/2020 at 1:02:01 PM.    Final    CT Maxillofacial Wo Contrast  Result Date: 01/01/2020 CLINICAL DATA:  Status  post trauma. EXAM: CT HEAD WITHOUT CONTRAST CT MAXILLOFACIAL WITHOUT CONTRAST TECHNIQUE: Multidetector CT imaging of the head and maxillofacial structures were performed using the standard protocol without intravenous contrast. Multiplanar CT image reconstructions of the maxillofacial structures were also generated. COMPARISON:  MRI head July 12, 2013. FINDINGS: CT HEAD FINDINGS Brain: There is mild cerebral atrophy with widening of the extra-axial spaces and ventricular dilatation. There are areas of decreased attenuation within the white matter tracts of the supratentorial brain, consistent with microvascular disease changes. Vascular: No hyperdense vessel or unexpected calcification. Skull: Normal. Negative for fracture or focal lesion. Other: There is marked severity left maxillary sinus mucosal thickening. CT MAXILLOFACIAL FINDINGS Osseous: No fracture or mandibular dislocation. No destructive process. Orbits: Negative. No traumatic or inflammatory finding. Sinuses: There is marked severity left maxillary sinus mucosal thickening. Soft tissues: Negative. IMPRESSION: 1. No acute intracranial abnormality. 2. Mild cerebral atrophy and microvascular disease changes of the supratentorial brain. 3. Marked severity left maxillary sinus mucosal thickening. Electronically Signed   By: Virgina Norfolk M.D.   On: 01/01/2020 21:48     HOSPITAL COURSE [name] is a 69 y.o. male with history of Htn, hepatitis C, Cervical disc herniation, and COPD presenting with gait instability, several falls, left sided numbness and bilateral LE weakness with ataxia. The patient received IV t-PA Saturday 01/01/20 at 22:15. +Cocaine in UDS.   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 MRI pending  Code Stroke CT Head - No acute head CT finding. Chronic small-vessel changes of the white matter. ASPECTS is 10   CT CS no acute abnormality. Moderate to severe degenerative  changes C5-6, C6-7. Moderate severe emphysema.   CT face L maxillary sinus dz  CT head post 24h no acute abnormality Small vessel disease. Atrophy.   MRI head -small punctate bicerebral embolic infarcts involving bilateral ACA, left MCA and bilateral cerebellum   MRA head -no large vessel stenosis or occlusion carotid Doppler - B ICA 1-39% stenosis, VAs antegrade    2D Echo - EF 35-40%. No source of embolus   [employer] Virus 2 - neg, serology positive  LDL - 87  HgbA1c - 5.9  UDS - + opiates, cocaine and benzos  HIV - pending  Troponin -elevated at 585 NG/mL  VTE prophylaxis - SCDs  No antithrombotic prior to admission, now on No antithrombotic. Add ASA 81 mg daily.   Therapy recommendations: Inpatient Rehab  Disposition:  Stable and ready for d/c to CIR. Pre-cert started yesterday per case mgt  Hypertension  Home BP meds: tenormin 25 bid  Current BP meds: coreg 3.125 bid  Stable  SBP goal now < 180 mm Hg   Long-term BP goal normotensive  Hyperlipidemia  Home Lipid lowering medication: none   LDL 87, goal < 70  Now on lipitor 40  Continue statin at discharge  Other Stroke Risk Factors  Advanced age  Former cigarette smoker - quit  Family hx stroke (mother and father)  Substance Abuse - heroin and cocaine. UDS +benzos, opiates and cocaine. Pt advised to stop using d/t stroke risk. SW addressed as well and pt refused cessation resources.  Acute on Chronic systolic and diastolic CHF secondary to cocaine use, UDS+ on admission, EF 35-40%. Takotsubo-like pattern c/w coronary spasm. Continue coreg. Add ARB if BP tolerates. Cardiology on board.  Other Active Problems  Code status - Full code  COPD / Emphysema [id number])  Marked severity left maxillary sinus mucosal thickening  Moderate to marked severity multilevel degenerative changes, most prominent at the levels of C5-C6 and C6-C7  Leukocytosis - 14.8-> improved now (afebrile)  CKD  stage 3a - creatinine - 1.44->1.50-> 1.3  Wide complex tachycardia d/t NSR w/ underlying BBB. Treated w/ IV amiodarone. Transitioned to oral. Cardiology on obard. F/u w/ Dr. Marlou Porch as OP.  Elevated troponin c/w demand ischemia from Takotsubo syndrome. Doubt acute coronary syndrome. Cath not needed. Cardiology on board.   DISCHARGE EXAM Blood pressure 107/63, pulse 76, temperature 99 F (37.2 C), temperature source Oral, resp. rate 18, height 5\' 11"  (1.803 m), weight 68 kg, SpO2 96 %. Frail middle-age Caucasian male not in distress. NAD, poor grooming, appears older than stated age           . Afebrile. Head is nontraumatic. Neck is supple without bruit.    Cardiac exam no murmur or gallop. Lungs are clear to auscultation. Distal pulses are well felt.    Neurological exam :  Awake alert oriented to time place and person. Speech is normal; fluent and spontaneous with normal comprehension. Attention normal.  Cranial Nerves: pupils are equal, round, and reactive to light.The face is symmetric. The palate elevates in the midline. Hearing intact. Voice is normal. Shoulder shrug is normal. The tongue is midline and has normal motion. Voice and hearing intact.  Coordination: Ataxia in the lower extremities Motor: No asymmetry, no atrophy, and no involuntary movements noted. Normal muscle tone. Mild left hemiparesis 4/5 strength with weakness of left grip and intrinsic hand muscles.  Orbits right over left upper extremity.  Fine finger movements are diminished on the left compared to the right. Normal strength on the right.    Sensation: intact to LT Gait: Deferred  Discharge Diet      Diet   Diet heart healthy/carb modified Room service appropriate? Yes; Fluid consistency: Thin   liquids  DISCHARGE PLAN  Disposition:  Transfer to [place] for ongoing PT, OT and ST  ASA 81mg  for secondary stroke prevention   Recommend ongoing stroke risk factor control by Primary Care  Physician at time of discharge from inpatient rehabilitation.  Follow-up PCP Kathyrn Lass, MD in 2 weeks following discharge from rehab.  Follow-up in [facility] in 4 weeks following discharge from rehab, office  to schedule an appointment.   37 minutes were spent preparing discharge.  Desiree Metzger-Cihelka, ARNP-C, ANVP-BC Pager: [phone]  I have personally obtained history,examined this patient, reviewed notes, independently viewed imaging studies, participated in medical decision making and plan of care.ROS completed by me personally and pertinent positives fully documented  I have made any additions or clarifications directly to the above note. Agree with note above. Discussed with patient and his ex-wife and answered questions  Antony Contras, MD Medical Director [place] Pager: [phone] 01/06/2020 4:51 PM

## 2020-01-06 NOTE — Plan of Care (Signed)
Adequate for DC.

## 2020-01-06 NOTE — H&P (Signed)
Physical Medicine and Rehabilitation Admission H&P    No chief complaint on file. : HPI: Travis Salinas is a 69 year old male with history of chronic combined systolic diastolic congestive heart failure, chronic pain syndrome maintained on oxycodone 40 mg 3 times daily as needed, BPH, CKD stage II with creatinine 1.31-1.44, COPD, hepatitis C, depression maintained on Abilify, Prozac, Klonopin, amantadine as well as Lamictal, polysubstance abuse, hypertension.  Per chart review lives with roommate independent prior to admission and is currently separated.  1 level home 3 flights of stairs.  Plan is to discharge home with his wife with assistance as needed before returning back to his apartment with roommate.  Presented 01/01/2020 with gait instability dizziness left side weakness as well as reported numerous falls without loss of consciousness.  His blood pressure ranged from 110/62-102/68.  Cranial CT scan negative for acute changes.  Noted chronic small vessel changes of the white matter.  Patient did not receive TPA.  CT cervical spine negative for acute process.  MRI/MRA showed several small acute infarcts involving multiple vascular territories bilaterally suggesting a central source.  No proximal intracranial vessel occlusion or stenosis.  Echocardiogram with ejection fraction of [id number] grade 1 diastolic dysfunction.  Admission chemistries WBC 14,800, BUN 41, creatinine 1.44, urine drug screen positive opiates cocaine and benzos, troponin high-sensitivity 585, CK 106.  Carotid Dopplers with no ICA stenosis.  Cardiology services consulted for complex tachycardia with underlying bundle branch block was placed on intravenous amiodarone transition to p.o. as well as started on Coreg.  Attempts at cardioversion unsuccessful.  His elevated troponin was felt to be related to demand ischemia/takotsubo syndrome.  He is currently maintained on aspirin and Plavix for CVA prophylaxis.  He had been on Cleviprex  for a short time for blood pressure control and monitoring.  Tolerating a regular diet.  Therapy evaluations completed and patient was admitted for a comprehensive rehab program.  Review of Systems  Constitutional: Negative for chills and fever.  HENT: Negative for hearing loss.   Eyes: Negative for blurred vision and double vision.  Respiratory: Negative for cough and shortness of breath.        Occasional shortness of breath with exertion  Cardiovascular: Negative for chest pain, palpitations and leg swelling.  Gastrointestinal: Positive for constipation. Negative for heartburn, nausea and vomiting.  Genitourinary: Positive for urgency.  Musculoskeletal: Positive for falls and myalgias.  Skin: Negative for rash.  Neurological: Positive for dizziness, weakness and headaches.  Psychiatric/Behavioral: Positive for depression. The patient has insomnia.        Anxiety  All other systems reviewed and are negative.  Past Medical History:  Diagnosis Date   Arthritis    Cervical disc herniation    COPD (chronic obstructive pulmonary disease) ([place])    Headache    " due to antibiotics"   Hepatitis C    Hypertension    Past Surgical History:  Procedure Laterality Date   APPLICATION OF A-CELL OF EXTREMITY Left 12/05/2016   Procedure: APPLICATION OF A-CELL OF EXTREMITY;  Surgeon: Loel Lofty Dillingham, DO;  Location: [facility];  Service: Plastics;  Laterality: Left;   APPLICATION OF WOUND VAC Left 12/05/2016   Procedure: APPLICATION OF WOUND VAC;  Surgeon: Loel Lofty Dillingham, DO;  Location: [facility];  Service: Plastics;  Laterality: Left;   I & D EXTREMITY Left 11/03/2016   Procedure: IRRIGATION AND DEBRIDEMENT EXTREMITY;  Surgeon: Leandrew Koyanagi, MD;  Location: [place];  Service: Orthopedics;  Laterality: Left;  I & D EXTREMITY Left 12/05/2016   Procedure: IRRIGATION AND DEBRIDEMENT EXTREMITY;  Surgeon: Loel Lofty Dillingham, DO;  Location: [place];  Service: Plastics;  Laterality: Left;    INCISION AND DRAINAGE ABSCESS Right 11/12/2013   Procedure: INCISION AND DRAINAGE AND OPEN PACKING OF RIGHT CALF  ABSCESS;  Surgeon: Earnstine Regal, MD;  Location: [facility];  Service: General;  Laterality: Right;   INCISION AND DRAINAGE OF WOUND Left 01/29/2017   Procedure: IRRIGATION AND DEBRIDEMENT OF LEFT LEG WOUND WITH ABRA PLACEMENT AND PLACEMENT OF WOUND VAC;  Surgeon: Wallace Going, DO;  Location: [facility];  Service: Plastics;  Laterality: Left;   KNEE SURGERY     Family History  Problem Relation Age of Onset   Heart disease Mother    Stroke Mother    Heart disease Father    Stroke Father    Social History:  reports that he quit smoking about 19 years ago. His smoking use included cigarettes. He has a 32.00 pack-year smoking history. He has never used smokeless tobacco. He reports current drug use. Drugs: Heroin and Cocaine. He reports that he does not drink alcohol. Allergies:  Allergies  Allergen Reactions   Propoxyphene Nausea And Vomiting   Amoxicillin Nausea And Vomiting   Ciprofloxacin Nausea Only   Depakote [Divalproex Sodium] Other (See Comments)    hallucinations   Medications Prior to Admission  Medication Sig Dispense Refill    stroke: mapping our early stages of recovery book MISC 1 each by Does not apply route once for 1 dose.     acetaminophen (TYLENOL) 325 MG tablet Take 2 tablets (650 mg total) by mouth every 4 (four) hours as needed for mild pain (or temp > 37.5 C (99.5 F)).     albuterol (PROVENTIL) (2.5 MG/3ML) 0.083% nebulizer solution Take 3 mLs (2.5 mg total) by nebulization every 2 (two) hours as needed for wheezing or shortness of breath. 75 mL 12   albuterol (VENTOLIN HFA) 108 (90 Base) MCG/ACT inhaler Inhale 2 puffs into the lungs every 6 (six) hours as needed for wheezing or shortness of breath.     amantadine (SYMMETREL) 100 MG capsule Take 100 mg by mouth daily as needed (restless leg).      amiodarone (PACERONE) 200 MG tablet Take 1  tablet (200 mg total) by mouth 2 (two) times daily. 31 tablet 1   ARIPiprazole (ABILIFY) 10 MG tablet Take 10 mg by mouth daily.     [START ON 01/07/2020] aspirin EC 81 MG EC tablet Take 1 tablet (81 mg total) by mouth daily.     [START ON 01/07/2020] atorvastatin (LIPITOR) 40 MG tablet Take 1 tablet (40 mg total) by mouth daily. 31 tablet 1   carvedilol (COREG) 3.125 MG tablet Take 1 tablet (3.125 mg total) by mouth 2 (two) times daily with a meal. 31 tablet 1   clonazePAM (KLONOPIN) 1 MG tablet Take 1 mg by mouth 2 (two) times daily.     [START ON 01/07/2020] clopidogrel (PLAVIX) 75 MG tablet Take 1 tablet (75 mg total) by mouth daily. 31 tablet 1   FLUoxetine (PROZAC) 10 MG tablet Take 10 mg by mouth daily.     fluticasone (FLONASE) 50 MCG/ACT nasal spray Place 1 spray into both nostrils daily as needed for allergies.   0   ibuprofen (ADVIL,MOTRIN) 600 MG tablet Take 1 tablet (600 mg total) by mouth 4 (four) times daily. (Patient not taking: Reported on 01/02/2020) 30 tablet 0   ipratropium-albuterol (DUONEB)  0.5-2.5 (3) MG/3ML SOLN Take 3 mLs by nebulization 3 (three) times daily. 360 mL    lamoTRIgine (LAMICTAL) 200 MG tablet Take 1 tablet (200 mg total) by mouth every morning.     Multiple Vitamins-Minerals (MULTIVITAMIN WITH MINERALS) tablet Take 1 tablet by mouth daily.     OXYCONTIN 40 MG 12 hr tablet Take 40 mg by mouth 3 (three) times daily as needed (pain).      silodosin (RAPAFLO) 8 MG CAPS capsule Take 8 mg daily with breakfast by mouth.     Tiotropium Bromide Monohydrate (SPIRIVA RESPIMAT) 2.5 MCG/ACT AERS Inhale 2 puffs into the lungs daily.     umeclidinium-vilanterol (ANORO ELLIPTA) 62.5-25 MCG/INH AEPB Inhale 1 puff into the lungs daily. 60 each 0    Drug Regimen Review Drug regimen was reviewed and remains appropriate with no significant issues identified  Home:     Functional History:    Functional Status:  Mobility:          ADL:    Cognition:       Physical Exam: Blood pressure 120/69, pulse 93, temperature 97.8 F (36.6 C), resp. rate 19, SpO2 99 %. Physical Exam  Nursing note and vitals reviewed. Constitutional: He appears well-developed and well-nourished.  Thin older male who appears older than stated age, sitting up in bed; somewhat anxious, NAD  HENT:  Head: Normocephalic and atraumatic.  Mouth/Throat: Oropharynx is clear and moist. No oropharyngeal exudate.  Abrasion on R top of nose No facial droop seen Facial sensation intact No tongue deviation  Eyes: Conjunctivae are normal. Right eye exhibits no discharge. Left eye exhibits no discharge.  EOMI B/L L nystagmus still  Neck: No tracheal deviation present.  Cardiovascular: Normal rate and regular rhythm.  RRR  Respiratory: No stridor.  CTA b/l- good air movement- but decreased at bases slightly B/L- c/o SOB, but no W/R/R Last sats 97%  GI:  Soft, NT, slightly distended,  (+)BS  Musculoskeletal:        General: Normal range of motion.     Cervical back: Normal range of motion and neck supple.     Comments: Biceps, triceps, WE, grip and finger abd 5/5 B/L HF, KE, KF, DF and PF 5/5 B/L   Neurological: He is alert.  Patient is alert a bit anxious.  Appears normal and stated age.  Patient is able to provide his date of birth year and place.  Follows simple commands.  He does have some perseveration. Vague- Ox2 Intact to light touch in all 4 extremities, but proprioception appears reduced B/L Finger to nose slowed B/L   Skin: Skin is warm and dry.  Psychiatric:  Perseverative, focused on wife not "knowing about cocaine use" and getting more pain meds. still    No results found for this or any previous visit (from the past 48 hour(s)). No results found.     Medical Problem List and Plan: 1.  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  2.  Antithrombotics: -DVT/anticoagulation: SCDs  -antiplatelet therapy: Aspirin 81 mg daily and Plavix 75 mg daily 3. Pain Management/chronic pain: Oxycodone 20 mg every 12 hours as needed 4. Mood: Lamictal 200 mg daily, amantadine 50 mg twice daily, Prozac 10 mg daily, Klonopin 1 mg twice daily  -antipsychotic agents: Abilify 10 mg daily 5. Neuropsych: This patient is  capable of making decisions on his own behalf. 6. Skin/Wound Care: Routine skin checks 7. Fluids/Electrolytes/Nutrition: Routine in and outs with follow-up chemistries 8.  Wide-complex tachycardia.  Continue amiodarone 200 mg daily.  Follow-up cardiology services 9.  Acute on chronic combined systolic diastolic congestive heart failure.  Monitor for any signs of fluid overload 10.  Hypertension.  Coreg 3.125 mg twice daily.  Monitor with increased mobility 11.  COPD.  Continue nebulizer as needed.  Monitor oxygen saturations. 12.  Polysubstance abuse.  Urine drug screen positive cocaine.  Provide counseling 13.  BPH.  Flomax 0.4 mg daily.  Check PVR 14.  Hyperlipidemia.  Lipitor   Lavon Paganini. Bassett, PA-C 01/06/2020   I have personally performed a face to face diagnostic evaluation of this patient and formulated the key components of the plan.  Additionally, I have personally reviewed laboratory data, imaging studies, as well as relevant notes and concur with the physician assistant's documentation above.   The patient's status has not changed from the original H&P.  Any changes in documentation from the acute care chart have been noted above.     Courtney Heys, MD 01/06/2020

## 2020-01-06 NOTE — Progress Notes (Signed)
Patient transferred to CIR

## 2020-01-06 NOTE — Progress Notes (Deleted)
Patient arrived with TR band in place and brace on arm, site clean dry and intact

## 2020-01-07 ENCOUNTER — Inpatient Hospital Stay (HOSPITAL_COMMUNITY): Payer: PPO | Admitting: Speech Pathology

## 2020-01-07 ENCOUNTER — Inpatient Hospital Stay (HOSPITAL_COMMUNITY): Payer: PPO | Admitting: Physical Therapy

## 2020-01-07 ENCOUNTER — Inpatient Hospital Stay (HOSPITAL_COMMUNITY): Payer: PPO | Admitting: Occupational Therapy

## 2020-01-07 ENCOUNTER — Inpatient Hospital Stay (HOSPITAL_COMMUNITY): Payer: PPO

## 2020-01-07 DIAGNOSIS — I63139 Cerebral infarction due to embolism of unspecified carotid artery: Secondary | ICD-10-CM

## 2020-01-07 LAB — COMPREHENSIVE METABOLIC PANEL
ALT: 22 U/L (ref 0–44)
AST: 28 U/L (ref 15–41)
Albumin: 3.1 g/dL — ABNORMAL LOW (ref 3.5–5.0)
Alkaline Phosphatase: 72 U/L (ref 38–126)
Anion gap: 11 (ref 5–15)
BUN: 15 mg/dL (ref 8–23)
CO2: 21 mmol/L — ABNORMAL LOW (ref 22–32)
Calcium: 8.8 mg/dL — ABNORMAL LOW (ref 8.9–10.3)
Chloride: 108 mmol/L (ref 98–111)
Creatinine, Ser: 1.18 mg/dL (ref 0.61–1.24)
GFR calc Af Amer: >60 mL/min (ref >60)
GFR calc non Af Amer: >60 mL/min (ref >60)
Glucose, Bld: 105 mg/dL — ABNORMAL HIGH (ref 70–99)
Potassium: 4.3 mmol/L (ref 3.5–5.1)
Sodium: 140 mmol/L (ref 135–145)
Total Bilirubin: 0.8 mg/dL (ref 0.3–1.2)
Total Protein: 5.5 g/dL — ABNORMAL LOW (ref 6.5–8.1)

## 2020-01-07 LAB — CBC WITH DIFFERENTIAL/PLATELET
Abs Immature Granulocytes: 0.04 10*3/uL (ref 0.00–0.07)
Basophils Absolute: 0.1 10*3/uL (ref 0.0–0.1)
Basophils Relative: 1 %
Eosinophils Absolute: 0.3 10*3/uL (ref 0.0–0.5)
Eosinophils Relative: 4 %
HCT: 40.1 % (ref 39.0–52.0)
Hemoglobin: 12.3 g/dL — ABNORMAL LOW (ref 13.0–17.0)
Immature Granulocytes: 0 %
Lymphocytes Relative: 28 %
Lymphs Abs: 2.6 10*3/uL (ref 0.7–4.0)
MCH: 27.3 pg (ref 26.0–34.0)
MCHC: 30.7 g/dL (ref 30.0–36.0)
MCV: 88.9 fL (ref 80.0–100.0)
Monocytes Absolute: 1.3 10*3/uL — ABNORMAL HIGH (ref 0.1–1.0)
Monocytes Relative: 14 %
Neutro Abs: 5 10*3/uL (ref 1.7–7.7)
Neutrophils Relative %: 53 %
Platelets: 220 10*3/uL (ref 150–400)
RBC: 4.51 MIL/uL (ref 4.22–5.81)
RDW: 13.7 % (ref 11.5–15.5)
WBC: 9.5 10*3/uL (ref 4.0–10.5)
nRBC: 0 % (ref 0.0–0.2)

## 2020-01-07 MED ORDER — TOPIRAMATE 25 MG PO TABS
25.0000 mg | ORAL_TABLET | Freq: Every day | ORAL | Status: DC
  Administered 2020-01-07 – 2020-01-10 (×4): 25 mg via ORAL
  Filled 2020-01-07 (×4): qty 1

## 2020-01-07 MED ORDER — MELATONIN 3 MG PO TABS
1.5000 mg | ORAL_TABLET | Freq: Every day | ORAL | Status: DC
  Administered 2020-01-07 – 2020-01-11 (×5): 1.5 mg via ORAL
  Filled 2020-01-07 (×5): qty 1

## 2020-01-07 NOTE — Progress Notes (Signed)
Pt requested to speak with nurse regarding pain and issues sleeping. Pt has already had prescribed tylenol and oxycodone which is not available at this time. Pt requested to ask doctor for something to help. Spoke with Dr. Posey Pronto at [phone] regarding pts request. Posey Pronto gave verbal orders for 25mg  Topamax and 1.5mg   Melatonin at HS.

## 2020-01-07 NOTE — Evaluation (Addendum)
Occupational Therapy Assessment and Plan  Patient Details  Name: Travis Salinas MRN: [id number] Date of Birth: [birth year]  OT Diagnosis: cognitive deficits and muscle weakness (generalized) Rehab Potential: Rehab Potential (ACUTE ONLY): Good ELOS: ~ 5 days   Today's Date: 01/07/2020 OT Individual Time: 1100-1155 OT Individual Time Calculation (min): 55 min     Problem List:  Patient Active Problem List   Diagnosis Date Noted  . Embolic cerebral infarction ([place]) 01/06/2020  . Acute renal failure ([place])   . Left leg weakness   . Ventricular tachyarrhythmia ([place])   . Stroke (cerebrum) ([place]) 01/01/2020  . Normocytic anemia 06/04/2017  . COPD (chronic obstructive pulmonary disease) ([place]) 06/04/2017  . Pneumococcal bacteremia 05/28/2017  . Pneumococcal pneumonia ([place]) 05/27/2017  . IVDU (intravenous drug user)   . Liver fibrosis 04/24/2016  . Chronic hepatitis C without hepatic coma ([place]) 04/19/2015  . Substance abuse ([place]) 04/19/2015  . HTN (hypertension) 11/12/2013  . Depression 11/12/2013    Past Medical History:  Past Medical History:  Diagnosis Date  . Arthritis   . Cervical disc herniation   . COPD (chronic obstructive pulmonary disease) ([place])   . Headache    " due to antibiotics"  . Hepatitis C   . Hypertension    Past Surgical History:  Past Surgical History:  Procedure Laterality Date  . APPLICATION OF A-CELL OF EXTREMITY Left 12/05/2016   Procedure: APPLICATION OF A-CELL OF EXTREMITY;  Surgeon: Loel Lofty Dillingham, DO;  Location: [place];  Service: Plastics;  Laterality: Left;  . APPLICATION OF WOUND VAC Left 12/05/2016   Procedure: APPLICATION OF WOUND VAC;  Surgeon: Loel Lofty Dillingham, DO;  Location: [place];  Service: Plastics;  Laterality: Left;  . I & D EXTREMITY Left 11/03/2016   Procedure: IRRIGATION AND DEBRIDEMENT EXTREMITY;  Surgeon: Leandrew Koyanagi, MD;  Location: [place];  Service: Orthopedics;  Laterality: Left;  . I & D EXTREMITY Left 12/05/2016    Procedure: IRRIGATION AND DEBRIDEMENT EXTREMITY;  Surgeon: Loel Lofty Dillingham, DO;  Location: [place];  Service: Plastics;  Laterality: Left;  . INCISION AND DRAINAGE ABSCESS Right 11/12/2013   Procedure: INCISION AND DRAINAGE AND OPEN PACKING OF RIGHT CALF  ABSCESS;  Surgeon: Earnstine Regal, MD;  [facility];  Service: General;  Laterality: Right;  . INCISION AND DRAINAGE OF WOUND Left 01/29/2017   Procedure: IRRIGATION AND DEBRIDEMENT OF LEFT LEG WOUND WITH ABRA PLACEMENT AND PLACEMENT OF WOUND VAC;  Surgeon: Wallace Going, DO;  [facility];  Service: Plastics;  Laterality: Left;  . KNEE SURGERY      Assessment & Plan Clinical Impression: Patient is a 69 y.o. year old male history of chronic combined systolic diastolic congestive heart failure, chronic pain syndrome maintained on oxycodone 40 mg 3 times daily as needed, BPH, CKD stage II with creatinine 1.31-1.44, COPD, hepatitis C, depression maintained on Abilify, Prozac, Klonopin, amantadine as well as Lamictal, polysubstance abuse, hypertension.  Per chart review lives with roommate independent prior to admission and is currently separated.  1 level home 3 flights of stairs.  Plan is to discharge home with his wife with assistance as needed before returning back to his apartment with roommate.  Presented 01/01/2020 with gait instability dizziness left side weakness as well as reported numerous falls without loss of consciousness.  His blood pressure ranged from 110/62-102/68.  Cranial CT scan negative for acute changes.  Noted chronic small vessel changes of the white matter.  Patient did not receive  TPA.  CT cervical spine negative for acute process.  MRI/MRA showed several small acute infarcts involving multiple vascular territories bilaterally suggesting a central source.  No proximal intracranial vessel occlusion or stenosis.  Echocardiogram with ejection fraction of 45% grade 1 diastolic dysfunction.  Admission chemistries WBC 14,800,  BUN 41, creatinine 1.44, urine drug screen positive opiates cocaine and benzos, troponin high-sensitivity 585, CK 106.  Carotid Dopplers with no ICA stenosis.  Cardiology services consulted for complex tachycardia with underlying bundle branch block was placed on intravenous amiodarone transition to p.o. as well as started on Coreg.  Attempts at cardioversion unsuccessful.  His elevated troponin was felt to be related to demand ischemia/takotsubo syndrome.  He is currently maintained on aspirin and Plavix for CVA prophylaxis.  He had been on Cleviprex for a short time for blood pressure control and monitoring.   Patient transferred to CIR on 01/06/2020 .    Patient currently requires supervision with basic self-care skills and basic mobility secondary to muscle weakness, decreased cardiorespiratoy endurance, decreased problem solving and decreased balance strategies.  Prior to hospitalization, patient could complete ADL with independent .  Patient will benefit from skilled intervention to decrease level of assist with basic self-care skills and increase independence with basic self-care skills prior to discharge home with care partner.  Anticipate patient will require intermittent supervision and follow up home health.  OT - End of Session Activity Tolerance: Tolerates 30+ min activity without fatigue Endurance Deficit: Yes OT Assessment Rehab Potential (ACUTE ONLY): Good OT Patient demonstrates impairments in the following area(s): Cognition;Endurance;Motor OT Basic ADL's Functional Problem(s): Bathing;Dressing;Toileting OT Advanced ADL's Functional Problem(s): Simple Meal Preparation;Laundry OT Transfers Functional Problem(s): Toilet;Tub/Shower OT Plan OT Intensity: Minimum of 1-2 x/day, 45 to 90 minutes OT Frequency: 5 out of 7 days OT Duration/Estimated Length of Stay: ~ 5 days OT Treatment/Interventions: Balance/vestibular training;Neuromuscular re-education;Self Care/advanced ADL  retraining;Cognitive remediation/compensation;Patient/family education;Functional mobility training;Psychosocial support;Therapeutic Activities OT Self Feeding Anticipated Outcome(s): n/a OT Basic Self-Care Anticipated Outcome(s): I OT Toileting Anticipated Outcome(s): I OT Bathroom Transfers Anticipated Outcome(s): I OT Recommendation Patient destination: Home Follow Up Recommendations: None Equipment Recommended: To be determined   Skilled Therapeutic Intervention OT eval initiated with OT purpose, roles and goals discussed. Pt was already up and in the bathroom when arrived. Pt navigated around the room with supervision without AD. Pt picked out clothing and showered sit to stand. Noted decr basic organization in the context of ADL. Pt washed body with shampoo but wanted to body wash. Donned underwear in standing with supervision but backwards first before noted they were backwards. Pt was able to self correct with questioning cues. In his underwear pt performing toothbrushing and shaving standing at sink with distant supervision. Pt ambulated around the room with supervision. Pt did not recall clothing in the bathroom he already picked out; with a questioning cue then recall and dressed sit to stand with supervision.   OT Evaluation Precautions/Restrictions   Precautions Precautions: Fall Restrictions Weight Bearing Restrictions: No General Chart Reviewed: Yes Family/Caregiver Present: No Vital Signs   Pain Pain Assessment Pain Scale: Faces Pain Score: 0-No pain Faces Pain Scale: Hurts a little bit Pain Type: Chronic pain Pain Location: Neck Pain Descriptors / Indicators: Aching Pain Onset: On-going Pain Intervention(s): Repositioned;Distraction Multiple Pain Sites: No Home Living/Prior Functioning Home Living Family/patient expects to be discharged to:: Private residence Living Arrangements: Spouse/significant other Available Help at Discharge: Friend(s), Available 24  hours/day Type of Home: Other(Comment) Home Access: Stairs to enter [employer]  of Steps: 3 flights Entrance Stairs-Rails: Right, Left  Lives With: Other (Comment) Prior Function Level of Independence: Independent with gait, Independent with transfers  Able to Take Stairs?: Yes Driving: No ADL ADL Eating: Independent Grooming: Independent Upper Body Bathing: Supervision/safety Where Assessed-Upper Body Bathing: Shower Lower Body Bathing: Supervision/safety Where Assessed-Lower Body Bathing: Shower Upper Body Dressing: Supervision/safety Lower Body Dressing: Supervision/safety Toileting: Supervision/safety Where Assessed-Toileting: Glass blower/designer: Distant supervision Armed forces technical officer Method: Magazine features editor: Distant supervision Social research officer, government Method: Ambulating Vision Baseline Vision/History: Wears glasses Wears Glasses: At all times Patient Visual Report: No change from baseline Vision Assessment?: Yes Perception  Perception: Within Functional Limits Praxis Praxis: Intact Cognition Overall Cognitive Status: Within Functional Limits for tasks assessed Arousal/Alertness: Awake/alert Orientation Level: Person;Place;Situation Person: Oriented Place: Oriented Situation: Oriented Year: 2021 Month: April Day of Week: Correct Memory: Appears intact Immediate Memory Recall: Sock;Blue;Bed Memory recall: Blue - not able to recall  Memory recall [place] - without cue Memory recall Bed: not able to recall  Attention: Sustained;Selective;Alternating Sustained Attention: Appears intact Selective Attention: Appears intact Awareness: Appears intact Problem Solving: Impaired Problem Solving Impairment: Functional complex;Functional basic Executive Function: Sequencing;Self Correcting;Organizing Sequencing: Appears intact Organizing: Impaired Organizing Impairment: Functional basic Self Monitoring: Appears intact Self Correcting:  Appears intact Safety/Judgment: Appears intact Sensation Sensation Light Touch: Appears Intact Hot/Cold: Appears Intact Proprioception: Appears Intact Stereognosis: Appears Intact Coordination Gross Motor Movements are Fluid and Coordinated: Yes Fine Motor Movements are Fluid and Coordinated: Yes Motor  Motor Motor: Within Functional Limits Motor - Skilled Clinical Observations: generalized weakness Mobility  Transfers Sit to Stand: Supervision/Verbal cueing Stand to Sit: Supervision/Verbal cueing  Trunk/Postural Assessment  Cervical Assessment Cervical Assessment: Within Functional Limits Thoracic Assessment Thoracic Assessment: Within Functional Limits Lumbar Assessment Lumbar Assessment: Within Functional Limits Postural Control Postural Control: Within Functional Limits  Balance Balance Balance Assessed: Yes Standardized Balance Assessment Standardized Balance Assessment: Berg Balance Test Berg Balance Test Sit to Stand: Able to stand without using hands and stabilize independently Standing Unsupported: Able to stand safely 2 minutes Sitting with Back Unsupported but Feet Supported on Floor or Stool: Able to sit safely and securely 2 minutes Stand to Sit: Sits safely with minimal use of hands Transfers: Able to transfer safely, minor use of hands Standing Unsupported with Eyes Closed: Able to stand 10 seconds with supervision Standing Ubsupported with Feet Together: Able to place feet together independently and stand for 1 minute with supervision From Standing, Reach Forward with Outstretched Arm: Can reach forward >12 cm safely (5") From Standing Position, Pick up Object from Floor: Able to pick up shoe, needs supervision From Standing Position, Turn to Look Behind Over each Shoulder: Looks behind from both sides and weight shifts well Turn 360 Degrees: Able to turn 360 degrees safely one side only in 4 seconds or less Standing Unsupported, Alternately Place Feet on  Step/Stool: Able to complete >2 steps/needs minimal assist Standing Unsupported, One Foot in Front: Needs help to step but can hold 15 seconds Standing on One Leg: Tries to lift leg/unable to hold 3 seconds but remains standing independently Total Score: 42 Static Sitting Balance Static Sitting - Level of Assistance: 7: Independent Dynamic Sitting Balance Dynamic Sitting - Balance Support: During functional activity Dynamic Sitting - Level of Assistance: 7: Independent Static Standing Balance Static Standing - Balance Support: During functional activity Static Standing - Level of Assistance: 6: Modified independent (Device/Increase time) Dynamic Standing Balance Dynamic Standing - Balance Support: During functional activity Dynamic Standing - Level  of Assistance: 5: Stand by assistance Extremity/Trunk Assessment RUE Assessment RUE Assessment: Within Functional Limits LUE Assessment LUE Assessment: Within Functional Limits     Refer to Care Plan for Long Term Goals  Recommendations for other services: Neuropsych   Discharge Criteria: Patient will be discharged from OT if patient refuses treatment 3 consecutive times without medical reason, if treatment goals not met, if there is a change in medical status, if patient makes no progress towards goals or if patient is discharged from hospital.  The above assessment, treatment plan, treatment alternatives and goals were discussed and mutually agreed upon: by patient  Nicoletta Ba 01/07/2020, 1:35 PM

## 2020-01-07 NOTE — Progress Notes (Signed)
Social Work Assessment and Plan   Patient Details  Name: Travis Salinas MRN: [id number] Date of Birth: 1951-01-13  Today's Date: 01/07/2020  Problem List:  Patient Active Problem List   Diagnosis Date Noted  . Embolic cerebral infarction ([place]) 01/06/2020  . Acute renal failure ([place])   . Left leg weakness   . Ventricular tachyarrhythmia ([place])   . Stroke (cerebrum) ([place]) 01/01/2020  . Normocytic anemia 06/04/2017  . COPD (chronic obstructive pulmonary disease) ([place]) 06/04/2017  . Pneumococcal bacteremia 05/28/2017  . Pneumococcal pneumonia ([place]) 05/27/2017  . IVDU (intravenous drug user)   . Liver fibrosis 04/24/2016  . Chronic hepatitis C without hepatic coma ([place]) 04/19/2015  . Substance abuse ([employer] [employer]) 04/19/2015  . HTN (hypertension) 11/12/2013  . Depression 11/12/2013   Past Medical History:  Past Medical History:  Diagnosis Date  . Arthritis   . Cervical disc herniation   . COPD (chronic obstructive pulmonary disease) ([place])   . Headache    " due to antibiotics"  . Hepatitis C   . Hypertension    Past Surgical History:  Past Surgical History:  Procedure Laterality Date  . APPLICATION OF A-CELL OF EXTREMITY Left 12/05/2016   Procedure: APPLICATION OF A-CELL OF EXTREMITY;  Surgeon: Loel Lofty Dillingham, DO;  Location: [place];  Service: Plastics;  Laterality: Left;  . APPLICATION OF WOUND VAC Left 12/05/2016   Procedure: APPLICATION OF WOUND VAC;  Surgeon: Loel Lofty Dillingham, DO;  Location: [place];  Service: Plastics;  Laterality: Left;  . I & D EXTREMITY Left 11/03/2016   Procedure: IRRIGATION AND DEBRIDEMENT EXTREMITY;  Surgeon: Leandrew Koyanagi, MD;  Location: [place];  Service: Orthopedics;  Laterality: Left;  . I & D EXTREMITY Left 12/05/2016   Procedure: IRRIGATION AND DEBRIDEMENT EXTREMITY;  Surgeon: Loel Lofty Dillingham, DO;  Location: [place];  Service: Plastics;  Laterality: Left;  . INCISION AND DRAINAGE ABSCESS Right 11/12/2013   Procedure: INCISION AND DRAINAGE AND OPEN  PACKING OF RIGHT CALF  ABSCESS;  Surgeon: Earnstine Regal, MD;  [facility];  Service: General;  Laterality: Right;  . INCISION AND DRAINAGE OF WOUND Left 01/29/2017   Procedure: IRRIGATION AND DEBRIDEMENT OF LEFT LEG WOUND WITH ABRA PLACEMENT AND PLACEMENT OF WOUND VAC;  Surgeon: Wallace Going, DO;  [facility];  Service: Plastics;  Laterality: Left;  . KNEE SURGERY     Social History:  reports that he quit smoking about 19 years ago. His smoking use included cigarettes. He has a 32.00 pack-year smoking history. He has never used smokeless tobacco. He reports current drug use. Drugs: Heroin and Cocaine. He reports that he does not drink alcohol.  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 Education: Administrator, arts Read: Yes Write: Yes   Abuse/Neglect Abuse/Neglect Assessment Can Be Completed: Yes Physical Abuse: Denies Verbal Abuse: Denies Sexual Abuse: Denies Exploitation of patient/patient's resources: Denies Self-Neglect: Denies  Emotional Status    Patient / Family Perceptions, Expectations & Goals Pt/Family understanding of illness & functional limitations: Patient states a little, but is keeping a positive attitude Pt/family expectations/goals: Goal to discharge home with spouse who is a retired Physiological scientist: None Premorbid Home Care/DME Agencies: None Transportation available at discharge: Spouse able to transport  Discharge Planning Living Arrangements:  Spouse/significant other Support Systems: Spouse/significant other Type of Residence: Private residence [employer]: ([employer]) Money Management:  Patient Does the patient have any problems obtaining your medications?: No Sw Barriers to Discharge: Home environment access/layout(Patients home has 3 floors. Discharging to Spouse home) Sw Barriers to Discharge Comments: Patient has 3 floors in home. Discharging to spouse home, 1 level Social Work Anticipated Follow Up Needs: HH/OP  Clinical Impression SW entered patient room to meet patient, introduce self and explain role. Patient sitting at bedside and pleasant. No question or questions. Patient is not a English as a second language teacher. SW will follow up with patient.   Dyanne Iha 01/07/2020, 10:33 AM

## 2020-01-07 NOTE — IPOC Note (Signed)
Overall Plan of Care [facility]) Patient Details Name: Travis Salinas MRN: [id number] DOB: [birth year]  Admitting Diagnosis: Stroke (cerebrum) [facility])  Hospital Problems: Principal Problem:   Stroke (cerebrum) [facility]) Active Problems:   Embolic cerebral infarction [facility])     Functional Problem List: Nursing Safety, Motor, Medication Management  PT Balance, Endurance, Motor  OT Cognition, Endurance, Motor  SLP    TR         Basic ADL's: OT Bathing, Dressing, Toileting     Advanced  ADL's: OT Simple Meal Preparation, Laundry     Transfers: PT Bed to Chair, Bed Mobility, Car, Furniture, Floor  OT Toilet, Tub/Shower     Locomotion: PT Ambulation, Stairs     Additional Impairments: OT    SLP        TR      Anticipated Outcomes Item Anticipated Outcome  Self Feeding n/a  Swallowing      Basic self-care  I  Toileting  I   Bathroom Transfers I  Bowel/Bladder  Mod I assist  Transfers  mod I basic transfers; supervision floor transfer  Locomotion  mod I household gait; supervision community/stairs  Communication     Cognition     Pain  < 3  Safety/Judgment  Mod I assist   Therapy Plan: PT Intensity: Minimum of 1-2 x/day ,45 to 90 minutes PT Frequency: 5 out of 7 days PT Duration Estimated Length of Stay: 5 days OT Intensity: Minimum of 1-2 x/day, 45 to 90 minutes OT Frequency: 5 out of 7 days OT Duration/Estimated Length of Stay: ~ 5 days SLP Duration/Estimated Length of Stay: n/a for ST   Due to the current state of emergency, patients may not be receiving their 3-hours of Medicare-mandated therapy.   Team Interventions: Nursing Interventions Patient/Family Education, Dysphagia/Aspiration Precaution Training, Discharge Planning, Medication Management, Psychosocial Support, Disease Management/Prevention, Cognitive Remediation/Compensation  PT interventions Ambulation/gait training, Training and development officer, Community reintegration, Discharge  planning, Disease management/prevention, DME/adaptive equipment instruction, Functional mobility training, Neuromuscular re-education, Pain management, Patient/family education, Psychosocial support, Stair training, Therapeutic Activities, Therapeutic Exercise, UE/LE Strength taining/ROM, UE/LE Coordination activities, Wheelchair propulsion/positioning  OT Interventions Training and development officer, Neuromuscular re-education, Self Care/advanced ADL retraining, Cognitive remediation/compensation, Patient/family education, Functional mobility training, Psychosocial support, Therapeutic Activities  SLP Interventions    TR Interventions    SW/CM Interventions Discharge Planning, Patient/Family Education, Psychosocial Support   Barriers to Discharge MD  Medical stability and hx substance abuse  Nursing      PT      OT      SLP      SW Home environment access/layout(Patients home has 3 floors. Discharging to Spouse home) Patient has 3 floors in home. Discharging to spouse home, 1 level   Team Discharge Planning: Destination: PT-Home ,OT- Home , SLP-Home Projected Follow-up: PT-Outpatient PT, OT-  None, SLP-24 hour supervision/assistance Projected Equipment Needs: PT-None recommended by PT, OT- To be determined, SLP-  Equipment Details: PT- , OT-  Patient/family involved in discharge planning: PT- Patient,  OT-Patient, SLP-Patient  MD ELOS: 5-7d Medical Rehab Prognosis:  Excellent Assessment:  69 year old male with history of chronic combined systolic diastolic congestive heart failure, chronic pain syndrome maintained on oxycodone 40 mg 3 times daily as needed, BPH, CKD stage II with creatinine 1.31-1.44, COPD, hepatitis C, depression maintained on Abilify, Prozac, Klonopin, amantadine as well as Lamictal, polysubstance abuse, hypertension.  Per chart review lives with roommate independent prior to admission and is currently separated.  1 level home 3 flights of stairs.  Plan is to discharge  home with his wife with assistance as needed before returning back to his apartment with roommate.  Presented 01/01/2020 with gait instability dizziness left side weakness as well as reported numerous falls without loss of consciousness.  His blood pressure ranged from 110/62-102/68.  Cranial CT scan negative for acute changes.  Noted chronic small vessel changes of the white matter.  Patient did not receive TPA.  CT cervical spine negative for acute process.  MRI/MRA showed several small acute infarcts involving multiple vascular territories bilaterally suggesting a central source.  No proximal intracranial vessel occlusion or stenosis.  Echocardiogram with ejection fraction of [id number] grade 1 diastolic dysfunction.  Admission chemistries WBC 14,800, BUN 41, creatinine 1.44, urine drug screen positive opiates cocaine and benzos, troponin high-sensitivity 585, CK 106.  Carotid Dopplers with no ICA stenosis.  Cardiology services consulted for complex tachycardia with underlying bundle branch block was placed on intravenous amiodarone transition to p.o. as well as started on Coreg.  Attempts at cardioversion unsuccessful.  His elevated troponin was felt to be related to demand ischemia/takotsubo syndrome.  He is currently maintained on aspirin and Plavix for CVA prophylaxis.  He had been on Cleviprex for a short time for blood pressure control and monitoring.     See Team Conference Notes for weekly updates to the plan of care

## 2020-01-07 NOTE — Care Management (Signed)
[facility] Individual Statement of Services  Patient Name:  Travis Salinas  Date:  01/07/2020  Welcome to the [facility].  Our goal is to provide you with an individualized program based on your diagnosis and situation, designed to meet your specific needs.  With this comprehensive rehabilitation program, you will be expected to participate in at least 3 hours of rehabilitation therapies Monday-Friday, with modified therapy programming on the weekends.  Your rehabilitation program will include the following services:  Physical Therapy (PT), Occupational Therapy (OT), Speech Therapy (ST), 24 hour per day rehabilitation nursing, Therapeutic Recreaction (TR), Case Management (Social Worker), Rehabilitation Medicine, Nutrition Services, Pharmacy Services and Other  Weekly team conferences will be held on Wednesdays to discuss your progress.  Your Social Worker will talk with you frequently to get your input and to update you on team discussions.  Team conferences with you and your family in attendance may also be held.  Expected length of stay: 7-10 Days  Overall anticipated outcome: MOD I/Supervision  Depending on your progress and recovery, your program may change. Your Social Worker will coordinate services and will keep you informed of any changes. Your Social Worker's name and contact numbers are listed  below.  The following services may also be recommended but are not provided by the [facility]:    [place] will be made to provide these services after discharge if needed.  Arrangements include referral to agencies that provide these services.  Your insurance has been verified to be:  [employer] Your primary doctor is:  Kathyrn Lass  Pertinent information will be shared with your doctor and your insurance company.  Social Worker:   Erlene Quan, [place] or ([phone]   Information discussed with and copy given to patient by: Dyanne Iha, 01/07/2020, 10:35 AM

## 2020-01-07 NOTE — Evaluation (Signed)
Speech Language Pathology Assessment and Plan  Patient Details  Name: Travis Salinas MRN: [id number] Date of Birth: Mar 17, 1951  SLP Diagnosis: (n/a)  Rehab Potential: Good ELOS: n/a for ST    Today's Date: 01/07/2020 SLP Individual Time: 9381-0175 SLP Individual Time Calculation (min): 53 min   Problem List:  Patient Active Problem List   Diagnosis Date Noted  . Embolic cerebral infarction ([place]) 01/06/2020  . Acute renal failure ([place])   . Left leg weakness   . Ventricular tachyarrhythmia ([place])   . Stroke (cerebrum) ([place]) 01/01/2020  . Normocytic anemia 06/04/2017  . COPD (chronic obstructive pulmonary disease) ([place]) 06/04/2017  . Pneumococcal bacteremia 05/28/2017  . Pneumococcal pneumonia ([place]) 05/27/2017  . IVDU (intravenous drug user)   . Liver fibrosis 04/24/2016  . Chronic hepatitis C without hepatic coma ([place]) 04/19/2015  . Substance abuse ([place]) 04/19/2015  . HTN (hypertension) 11/12/2013  . Depression 11/12/2013   Past Medical History:  Past Medical History:  Diagnosis Date  . Arthritis   . Cervical disc herniation   . COPD (chronic obstructive pulmonary disease) ([place])   . Headache    " due to antibiotics"  . Hepatitis C   . Hypertension    Past Surgical History:  Past Surgical History:  Procedure Laterality Date  . APPLICATION OF A-CELL OF EXTREMITY Left 12/05/2016   Procedure: APPLICATION OF A-CELL OF EXTREMITY;  Surgeon: Loel Lofty Dillingham, DO;  Location: [facility];  Service: Plastics;  Laterality: Left;  . APPLICATION OF WOUND VAC Left 12/05/2016   Procedure: APPLICATION OF WOUND VAC;  Surgeon: Loel Lofty Dillingham, DO;  Location: [facility];  Service: Plastics;  Laterality: Left;  . I & D EXTREMITY Left 11/03/2016   Procedure: IRRIGATION AND DEBRIDEMENT EXTREMITY;  Surgeon: Leandrew Koyanagi, MD;  Location: [facility];  Service: Orthopedics;  Laterality: Left;  . I & D EXTREMITY Left 12/05/2016   Procedure: IRRIGATION AND DEBRIDEMENT EXTREMITY;  Surgeon: Loel Lofty  Dillingham, DO;  Location: [facility];  Service: Plastics;  Laterality: Left;  . INCISION AND DRAINAGE ABSCESS Right 11/12/2013   Procedure: INCISION AND DRAINAGE AND OPEN PACKING OF RIGHT CALF  ABSCESS;  Surgeon: Earnstine Regal, MD;  Location: [facility];  Service: General;  Laterality: Right;  . INCISION AND DRAINAGE OF WOUND Left 01/29/2017   Procedure: IRRIGATION AND DEBRIDEMENT OF LEFT LEG WOUND WITH ABRA PLACEMENT AND PLACEMENT OF WOUND VAC;  Surgeon: Wallace Going, DO;  Location: [facility];  Service: Plastics;  Laterality: Left;  . KNEE SURGERY      Assessment / Plan / Recommendation Clinical Impression   HPI: Travis Salinas is a 69 year old male with history of chronic combined systolic diastolic congestive heart failure, chronic pain syndrome maintained on oxycodone 40 mg 3 times daily as needed, BPH, CKD stage II with creatinine 1.31-1.44, COPD, hepatitis C, depression maintained on Abilify, Prozac, Klonopin, amantadine as well as Lamictal, polysubstance abuse, hypertension.  Per chart review lives with roommate independent prior to admission and is currently separated.  1 level home 3 flights of stairs.  Plan is to discharge home with his wife with assistance as needed before returning back to his apartment with roommate.  Presented 01/01/2020 with gait instability dizziness left side weakness as well as reported numerous falls without loss of consciousness.  His blood pressure ranged from 110/62-102/68.  Cranial CT scan negative for acute changes.  Noted chronic small vessel changes of the white matter.  Patient did not receive TPA.  CT  cervical spine negative for acute process.  MRI/MRA showed several small acute infarcts involving multiple vascular territories bilaterally suggesting a central source.  No proximal intracranial vessel occlusion or stenosis.  Echocardiogram with ejection fraction of 76% grade 1 diastolic dysfunction.  Admission chemistries WBC 14,800, BUN 41, creatinine 1.44, urine  drug screen positive opiates cocaine and benzos, troponin high-sensitivity 585, CK 106.  Carotid Dopplers with no ICA stenosis.  Cardiology services consulted for complex tachycardia with underlying bundle branch block was placed on intravenous amiodarone transition to p.o. as well as started on Coreg.  Attempts at cardioversion unsuccessful.  His elevated troponin was felt to be related to demand ischemia/takotsubo syndrome.  He is currently maintained on aspirin and Plavix for CVA prophylaxis.  He had been on Cleviprex for a short time for blood pressure control and monitoring.  Tolerating a regular diet.  Therapy evaluations completed and patient was admitted for a comprehensive rehab program 01/06/20 and SLP evaluations were completed 01/07/20 with results as follows:  Although no family was present to confirm baseline, pt presents with cognitive-linguistic function within functional limits. He scored WFL on all subtests of the Cognistat and demonstrated ability to problem solve functional situations and in tact verbal reasoning throughout evaluation as well. He was OX4 and verbalized good awareness of current deficits s/p acute CVA. He was able to recall functional information including safety precautions while in the hospital. Expressive and receptive language skills were also [facility], and pt's speech was 100% intelligible in conversation. No skilled ST is indicated at this time.     Skilled Therapeutic Interventions          Cognitive-linguistic evaluation was administered and results were reviewed with pt (please see above for details regarding results).   SLP Assessment  Patient does not need any further Speech [facility] Pathology Services    Recommendations  Patient destination: Home Follow up Recommendations: 24 hour supervision/assistance         SLP Duration     n/a for ST          Pain Pain Assessment Pain Scale: Faces Faces Pain Scale: Hurts a little bit Pain Type: Chronic  pain Pain Location: Neck Pain Descriptors / Indicators: Aching Pain Onset: On-going Pain Intervention(s): Repositioned;Distraction Multiple Pain Sites: No   SLP Evaluation Cognition Overall Cognitive Status: Within Functional Limits for tasks assessed Arousal/Alertness: Awake/alert Orientation Level: Oriented X4 Attention: Sustained;Selective;Alternating Sustained Attention: Appears intact Selective Attention: Appears intact Alternating Attention: Appears intact Memory: Appears intact Awareness: Appears intact Problem Solving: Appears intact Executive Function: Sequencing;Self Correcting;Organizing Sequencing: Appears intact Organizing: Appears intact Self Monitoring: Appears intact Self Correcting: Appears intact Safety/Judgment: Appears intact  Comprehension Auditory Comprehension Overall Auditory Comprehension: Appears within functional limits for tasks assessed Yes/No Questions: Within Functional Limits Commands: Within Functional Limits Conversation: Complex Visual Recognition/Discrimination Discrimination: Within Function Limits Reading Comprehension Reading Status: Within funtional limits Expression Expression Primary Mode of Expression: Verbal Verbal Expression Overall Verbal Expression: Appears within functional limits for tasks assessed Initiation: No impairment Level of Generative/Spontaneous Verbalization: Financial controller Expression Written Expression: Not tested Oral Motor Oral Motor/Sensory Function Overall Oral Motor/Sensory Function: Within functional limits Motor Speech Overall Motor Speech: Appears within functional limits for tasks assessed Phonation: Normal Intelligibility: Intelligible Motor Speech Errors: Not applicable  Intelligibility: Intelligible   Short Term Goals: No short term goals set  Recommendations for other services: None   Discharge Criteria: Patient will be discharged from SLP if patient refuses treatment 3  consecutive times without medical reason, if  treatment goals not met, if there is a change in medical status, if patient makes no progress towards goals or if patient is discharged from hospital.  The above assessment, treatment plan, treatment alternatives and goals were discussed and mutually agreed upon: by patient  Arbutus Leas 01/07/2020, 12:23 PM

## 2020-01-07 NOTE — Progress Notes (Signed)
Inpatient Rehabilitation  Patient information reviewed and entered into eRehab system by Isela Stantz M. Mylene Bow, M.A., CCC/SLP, PPS Coordinator.  Information including medical coding, functional ability and quality indicators will be reviewed and updated through discharge.    

## 2020-01-07 NOTE — Plan of Care (Signed)
  Problem: Consults Goal: RH STROKE PATIENT EDUCATION Description: See Patient Education module for education specifics  Outcome: Progressing   Problem: RH SAFETY Goal: RH STG ADHERE TO SAFETY PRECAUTIONS W/ASSISTANCE/DEVICE Description: STG Adhere to Safety Precautions With mod I Assistance/Device. Outcome: Progressing   Problem: RH KNOWLEDGE DEFICIT Goal: RH STG INCREASE KNOWLEDGE OF HYPERTENSION Description: Patient/caregiver will verbalize understanding of HTN management including diet, exercise, medications, monitoring, and follow up care with mod I assist. Outcome: Progressing Goal: RH STG INCREASE KNOWLEGDE OF HYPERLIPIDEMIA Description: Patient/caregiver will verbalize understanding of HLD management including diet, exercise, medications, monitoring, and follow up care with mod I assist. Outcome: Progressing Goal: RH STG INCREASE KNOWLEDGE OF STROKE PROPHYLAXIS Description: Patient/caregiver will verbalize understanding of stroke prophylaxis management including diet, exercise, medications, monitoring, and follow up care with mod I assist. Outcome: Progressing

## 2020-01-07 NOTE — Progress Notes (Signed)
PHYSICAL MEDICINE & REHABILITATION PROGRESS NOTE   Subjective/Complaints: No issues overnite.  No pain c/o, denies bowel or bladder issues  ROS- neg CP, SOB, N/V/D   Objective:   No results found. No results for input(s): WBC, HGB, HCT, PLT in the last 72 hours. No results for input(s): NA, K, CL, CO2, GLUCOSE, BUN, CREATININE, CALCIUM in the last 72 hours.  Intake/Output Summary (Last 24 hours) at 01/07/2020 0623 Last data filed at 01/06/2020 1833 Gross per 24 hour  Intake 200 ml  Output --  Net 200 ml     Physical Exam: Vital Signs Blood pressure 120/74, pulse 74, temperature 97.7 F (36.5 C), temperature source Oral, resp. rate 18, height 5\' 11"  (1.803 m), weight 63.7 kg, SpO2 97 %.   General: No acute distress Mood and affect are appropriate Heart: Regular rate and rhythm no rubs murmurs or extra sounds Lungs: Clear to auscultation, breathing unlabored, no rales or wheezes Abdomen: Positive bowel sounds, soft nontender to palpation, nondistended Extremities: No clubbing, cyanosis, or edema Skin: No evidence of breakdown, no evidence of rash Neurologic: Cranial nerves II through XII intact, motor strength is 5/5 in bilateral deltoid, bicep, tricep, grip, hip flexor, knee extensors, ankle dorsiflexor and plantar flexor  Musculoskeletal: Full range of motion in all 4 extremities. No joint swelling    Assessment/Plan: 1. Functional deficits secondary to bicerebral cardioembolic infarcts which require 3+ hours per day of interdisciplinary therapy in a comprehensive inpatient rehab setting. Physiatrist is providing close team supervision and 24 hour management of active medical problems listed below. Physiatrist and rehab team continue to assess barriers to discharge/monitor patient progress toward functional and medical goals  Care Tool:  Bathing              Bathing assist       Upper Body Dressing/Undressing Upper body dressing   What is the  patient wearing?: Pull over shirt    Upper body assist      Lower Body Dressing/Undressing Lower body dressing      What is the patient wearing?: Pants     Lower body assist       Toileting Toileting    Toileting assist       Transfers Chair/bed transfer  Transfers assist     Chair/bed transfer assist level: Contact Guard/Touching assist     Locomotion Ambulation   Ambulation assist      Assist level: Contact Guard/Touching assist Assistive device: No Device     Walk 10 feet activity   Assist           Walk 50 feet activity   Assist           Walk 150 feet activity   Assist           Walk 10 feet on uneven surface  activity   Assist           Wheelchair     Assist               Wheelchair 50 feet with 2 turns activity    Assist            Wheelchair 150 feet activity     Assist          Blood pressure 120/74, pulse 74, temperature 97.7 F (36.5 C), temperature source Oral, resp. rate 18, height 5\' 11"  (1.803 m), weight 63.7 kg, SpO2 97 %.  Medical Problem List and Plan: 1.  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  2.  Antithrombotics: -DVT/anticoagulation: SCDs             -antiplatelet therapy: Aspirin 81 mg daily and Plavix 75 mg daily 3. Pain Management/chronic pain: Oxycodone 20 mg every 12 hours as needed will try to keep on lower than home dose due to cognitive effects and fall risk 4. Mood: Lamictal 200 mg daily, amantadine 50 mg twice daily, Prozac 10 mg daily, Klonopin 1 mg twice daily             -antipsychotic agents: Abilify 10 mg daily 5. Neuropsych: This patient is capable of making decisions on his own behalf. 6. Skin/Wound Care: Routine skin checks 7.  Fluids/Electrolytes/Nutrition: Routine in and outs with follow-up chemistries 8.  Wide-complex tachycardia.  Continue amiodarone 200 mg daily.  Follow-up cardiology services 9.  Acute on chronic combined systolic diastolic congestive heart failure.  Monitor for any signs of fluid overload 10.  Hypertension.  Coreg 3.125 mg twice daily.  Monitor with increased mobility Vitals:   01/06/20 1919 01/07/20 0550  BP: (!) 107/51 120/74  Pulse: 74 74  Resp: 18 18  Temp: (!) 97.5 F (36.4 C) 97.7 F (36.5 C)  SpO2: 98% 97%    11.  COPD.  Continue nebulizer as needed.  Monitor oxygen saturations. 12.  Polysubstance abuse.  Urine drug screen positive cocaine.  Provide counseling 13.  BPH.  Flomax 0.4 mg daily.  Check PVR 14.  Hyperlipidemia.  Lipitor    LOS: 1 days A FACE TO FACE EVALUATION WAS PERFORMED  Charlett Blake 01/07/2020, 6:23 AM

## 2020-01-07 NOTE — Progress Notes (Signed)
Physical Therapy Session Note  Patient Details  Name: Travis Salinas MRN: [id number] Date of Birth: 04/30/1951  Today's Date: 01/07/2020 PT Individual Time: 1617-1700 PT Individual Time Calculation (min): 43 min   Short Term Goals: Week 1:  PT Short Term Goal 1 (Week 1): = LTGs  Skilled Therapeutic Interventions/Progress Updates:  Pt received supine in bed and agreeable to PT. Supine>sit transfer without assist or cues. Pt donned shoes sitting EOB with distant supervision assist. Gait training through rehab unit with distant supervision assist min cues for awareness of obstacles on the L and attention to task to prevent foot drag and LOB.   Dynamic balance training instructed by PT:  Reciprocal foot taps on 8 inch step from airex pad. Min-mod assist assist to prevent L LOB as well as cues for improved step length, weight shifting, and decreased speed. 2 x 15 BLE. Standing on balance board: lateral weight shifting R and L with BUE on rail x 15 and on PT x 10 bil. AP weight shift x 1 min. Squat x 12 on balance board with lateral instability UE support on parallel bars.   Peg board puzzle while standing. Supervision assist to maintain balance and only min questioning cues for error detection and correct. Pt returned to room and performed ambulatory transfer to bed with no AD and supervision assist. Sit>supine completed without assist, and left supine in bed with call bell in reach and all needs met.      Therapy Documentation Precautions:  Precautions Precautions: Fall Restrictions Weight Bearing Restrictions: No    Pain: Pain Assessment Pain Scale: 0-10 Pain Score: 8  Pain Type: Chronic pain Pain Location: Neck Pain Descriptors / Indicators: Aching Pain Frequency: Constant Pain Onset: On-going Pain Intervention(s): Medication (See eMAR) Multiple Pain Sites: No    Therapy/Group: Individual Therapy  Lorie Phenix 01/07/2020, 5:10 PM

## 2020-01-07 NOTE — Evaluation (Signed)
Physical Therapy Assessment and Plan  Patient Details  Name: Travis Salinas MRN: [id number] Date of Birth: 30-Nov-1950  PT Diagnosis: Difficulty walking, Hemiplegia non-dominant and Muscle weakness Rehab Potential: Good ELOS: 5 days   Today's Date: 01/07/2020 PT Individual Time: 1300-1400 PT Individual Time Calculation (min): 60 min    Problem List:  Patient Active Problem List   Diagnosis Date Noted  . Embolic cerebral infarction ([place]) 01/06/2020  . Acute renal failure ([place])   . Left leg weakness   . Ventricular tachyarrhythmia ([place])   . Stroke (cerebrum) ([place]) 01/01/2020  . Normocytic anemia 06/04/2017  . COPD (chronic obstructive pulmonary disease) ([place]) 06/04/2017  . Pneumococcal bacteremia 05/28/2017  . Pneumococcal pneumonia ([place]) 05/27/2017  . IVDU (intravenous drug user)   . Liver fibrosis 04/24/2016  . Chronic hepatitis C without hepatic coma ([place]) 04/19/2015  . Substance abuse ([place]) 04/19/2015  . HTN (hypertension) 11/12/2013  . Depression 11/12/2013    Past Medical History:  Past Medical History:  Diagnosis Date  . Arthritis   . Cervical disc herniation   . COPD (chronic obstructive pulmonary disease) ([place])   . Headache    " due to antibiotics"  . Hepatitis C   . Hypertension    Past Surgical History:  Past Surgical History:  Procedure Laterality Date  . APPLICATION OF A-CELL OF EXTREMITY Left 12/05/2016   Procedure: APPLICATION OF A-CELL OF EXTREMITY;  Surgeon: Loel Lofty Dillingham, DO;  Location: [facility];  Service: Plastics;  Laterality: Left;  . APPLICATION OF WOUND VAC Left 12/05/2016   Procedure: APPLICATION OF WOUND VAC;  Surgeon: Loel Lofty Dillingham, DO;  Location: [facility];  Service: Plastics;  Laterality: Left;  . I & D EXTREMITY Left 11/03/2016   Procedure: IRRIGATION AND DEBRIDEMENT EXTREMITY;  Surgeon: Leandrew Koyanagi, MD;  Location: [facility];  Service: Orthopedics;  Laterality: Left;  . I & D EXTREMITY Left 12/05/2016   Procedure: IRRIGATION AND  DEBRIDEMENT EXTREMITY;  Surgeon: Loel Lofty Dillingham, DO;  Location: [facility];  Service: Plastics;  Laterality: Left;  . INCISION AND DRAINAGE ABSCESS Right 11/12/2013   Procedure: INCISION AND DRAINAGE AND OPEN PACKING OF RIGHT CALF  ABSCESS;  Surgeon: Earnstine Regal, MD;  Location: [facility];  Service: General;  Laterality: Right;  . INCISION AND DRAINAGE OF WOUND Left 01/29/2017   Procedure: IRRIGATION AND DEBRIDEMENT OF LEFT LEG WOUND WITH ABRA PLACEMENT AND PLACEMENT OF WOUND VAC;  Surgeon: Wallace Going, DO;  Location: [facility];  Service: Plastics;  Laterality: Left;  . KNEE SURGERY      Assessment & Plan Clinical Impression: Patient is a 69 year old male with history of chronic combined systolic diastolic congestive heart failure, chronic pain syndrome maintained on oxycodone 40 mg 3 times daily as needed, BPH, CKD stage II with creatinine 1.31-1.44, COPD, hepatitis C, depression maintained on Abilify, Prozac, Klonopin, amantadine as well as Lamictal, polysubstance abuse, hypertension. Per chart review lives with roommate independent prior to admission and is currently separated. 1 level home 3 flights of stairs. Plan is to discharge home with his wife with assistance as needed before returning back to his apartment with roommate. Presented 01/01/2020 with gait instability dizziness left side weakness as well as reported numerous falls without loss of consciousness. His blood pressure ranged from 110/62-102/68. Cranial CT scan negative for acute changes. Noted chronic small vessel changes of the white matter. Patient did not receive TPA. CT cervical spine negative for acute process. MRI/MRA showed several small acute infarcts involving  multiple vascular territories bilaterally suggesting a central source. No proximal intracranial vessel occlusion or stenosis. Echocardiogram with ejection fraction of 40% grade 1 diastolic dysfunction. Admission chemistries WBC 14,800, BUN 41, creatinine 1.44,  urine drug screen positive opiates cocaine and benzos, troponin high-sensitivity 585, CK 106. Carotid Dopplers with no ICA stenosis. Cardiology services consulted for complex tachycardia with underlying bundle branch block was placed on intravenous amiodarone transition to p.o. as well as started on Coreg. Attempts at cardioversion unsuccessful. His elevated troponin was felt to be related to demand ischemia/takotsubosyndrome. He is currently maintained on aspirin and Plavix for CVA prophylaxis. He had been on Cleviprex for a short time for blood pressure control and monitoring. Tolerating a regular diet.  Patient transferred to CIR on 01/06/2020 .   Patient currently requires supervision to CGA with mobility secondary to muscle weakness, decreased cardiorespiratoy endurance and decreased standing balance and decreased balance strategies.  Prior to hospitalization, patient was independent  with mobility and lived with (roommate; will live with wife at d/c) in a (condo) home.  Home access is 3 flightsStairs to enter.  Patient will benefit from skilled PT intervention to maximize safe functional mobility, minimize fall risk and decrease caregiver burden for planned discharge home with 24 hour supervision.  Anticipate patient will benefit from follow up OP at discharge.  PT - End of Session Activity Tolerance: Decreased this session Endurance Deficit: Yes PT Assessment Rehab Potential (ACUTE/IP ONLY): Good PT Patient demonstrates impairments in the following area(s): Balance;Endurance;Motor PT Transfers Functional Problem(s): Bed to Chair;Bed Mobility;Car;Furniture;Floor PT Locomotion Functional Problem(s): Ambulation;Stairs PT Plan PT Intensity: Minimum of 1-2 x/day ,45 to 90 minutes PT Frequency: 5 out of 7 days PT Duration Estimated Length of Stay: 5 days PT Treatment/Interventions: Ambulation/gait training;Balance/vestibular training;Community reintegration;Discharge planning;Disease  management/prevention;DME/adaptive equipment instruction;Functional mobility training;Neuromuscular re-education;Pain management;Patient/family education;Psychosocial support;Stair training;Therapeutic Activities;Therapeutic Exercise;UE/LE Strength taining/ROM;UE/LE Coordination activities;Wheelchair propulsion/positioning PT Transfers Anticipated Outcome(s): mod I basic transfers; supervision floor transfer PT Locomotion Anticipated Outcome(s): mod I household gait; supervision community/stairs PT Recommendation Follow Up Recommendations: Outpatient PT Patient destination: Home Equipment Recommended: None recommended by PT  Skilled Therapeutic Intervention Evaluation completed (see details above and below) with education on PT POC and goals and individual treatment initiated with focus on functional mobility re-training in home and controlled environments, simulate car transfers, ramp/uneven surface/curb step negotiation to simulate community mobility, stair negotiation in stairwell to simulate access to his condo, and administering of Berg Balance Assessment for fall risk. Pt performed functional mobility overall supervision to occasional CGA due to minor LOB's during longer distance gait and more dynamic tasks and did not use an AD during mobility. Pt denies concerns in regards to d/c except feeling that he is overall weaker on his left side.   PT Evaluation Precautions/Restrictions Precautions Precautions: Fall Restrictions Weight Bearing Restrictions: No Pain No reports of pain during session. Home Living/Prior Functioning Home Living Available Help at Discharge: Family;Friend(s);Available 24 hours/day Type of Home: (condo) Home Access: Stairs to enter [employer] of Steps: 3 flights Entrance Stairs-Rails: Right;Left Home Layout: One level Additional Comments: will d/c to wife's house which is level entry and one level  Lives With: (roommate; will live with wife at  d/c) Prior Function Level of Independence: Independent with gait;Independent with transfers  Able to Take Stairs?: Yes Driving: No Vision/Perception  Perception Perception: Within Functional Limits Praxis Praxis: Intact  Cognition Overall Cognitive Status: Within Functional Limits for tasks assessed Arousal/Alertness: Awake/alert Orientation Level: Oriented X4 Attention: Sustained;Selective;Alternating Sustained Attention: Appears intact Selective  Attention: Appears intact Alternating Attention: Appears intact Memory: Appears intact Immediate Memory Recall: Sock;Blue;Bed Awareness: Appears intact Problem Solving: Impaired Problem Solving Impairment: Functional complex;Functional basic Executive Function: Sequencing;Self Correcting;Organizing Sequencing: Appears intact Organizing: Impaired Organizing Impairment: Functional basic Self Monitoring: Appears intact Self Correcting: Appears intact Safety/Judgment: Appears intact Sensation Sensation Light Touch: Appears Intact Hot/Cold: Appears Intact Proprioception: Appears Intact Stereognosis: Appears Intact Coordination Gross Motor Movements are Fluid and Coordinated: Yes Fine Motor Movements are Fluid and Coordinated: Yes Motor  Motor Motor: Hemiplegia Motor - Skilled Clinical Observations: mild L hemiplegia  Mobility Bed Mobility Bed Mobility: Rolling Right;Rolling Left;Supine to Sit;Sit to Supine Rolling Right: Independent Rolling Left: Independent Supine to Sit: Independent Sit to Supine: Independent Transfers Transfers: Sit to Stand;Stand to Sit;Stand Pivot Transfers Sit to Stand: Supervision/Verbal cueing Stand to Sit: Supervision/Verbal cueing Stand Pivot Transfers: Supervision/Verbal cueing Transfer (Assistive device): None Locomotion  Gait Gait Distance (Feet): 200 Feet Assistive device: None Gait Gait Pattern: Impaired Stairs / Additional Locomotion Stairs: Yes Stairs Assistance: Contact  Guard/Touching assist Stair Management Technique: No rails;Two rails;Step to pattern Number of Stairs: 24 Ramp: Contact Guard/touching assist Curb: Contact Guard/Touching assist Wheelchair Mobility Wheelchair Mobility: No  Trunk/Postural Assessment  Cervical Assessment Cervical Assessment: Within Functional Limits Thoracic Assessment Thoracic Assessment: Within Functional Limits Lumbar Assessment Lumbar Assessment: Within Functional Limits Postural Control Postural Control: Within Functional Limits  Balance Balance Balance Assessed: Yes Standardized Balance Assessment Standardized Balance Assessment: Berg Balance Test Berg Balance Test Sit to Stand: Able to stand without using hands and stabilize independently Standing Unsupported: Able to stand safely 2 minutes Sitting with Back Unsupported but Feet Supported on Floor or Stool: Able to sit safely and securely 2 minutes Stand to Sit: Sits safely with minimal use of hands Transfers: Able to transfer safely, minor use of hands Standing Unsupported with Eyes Closed: Able to stand 10 seconds with supervision Standing Ubsupported with Feet Together: Able to place feet together independently and stand for 1 minute with supervision From Standing, Reach Forward with Outstretched Arm: Can reach forward >12 cm safely (5") From Standing Position, Pick up Object from Floor: Able to pick up shoe, needs supervision From Standing Position, Turn to Look Behind Over each Shoulder: Looks behind from both sides and weight shifts well Turn 360 Degrees: Able to turn 360 degrees safely one side only in 4 seconds or less Standing Unsupported, Alternately Place Feet on Step/Stool: Able to complete >2 steps/needs minimal assist Standing Unsupported, One Foot in Front: Needs help to step but can hold 15 seconds Standing on One Leg: Tries to lift leg/unable to hold 3 seconds but remains standing independently Total Score: 42 Static Sitting Balance Static  Sitting - Level of Assistance: 7: Independent Dynamic Sitting Balance Dynamic Sitting - Balance Support: During functional activity Dynamic Sitting - Level of Assistance: 7: Independent Static Standing Balance Static Standing - Balance Support: During functional activity Static Standing - Level of Assistance: 5: Stand by assistance Dynamic Standing Balance Dynamic Standing - Balance Support: During functional activity Dynamic Standing - Level of Assistance: 4: Min assist Extremity Assessment  RUE Assessment RUE Assessment: Within Functional Limits LUE Assessment LUE Assessment: Within Functional Limits RLE Assessment RLE Assessment: Within Functional Limits LLE Assessment LLE Assessment: Exceptions to [facility] General Strength Comments: grossly 4/5    Refer to Care Plan for Long Term Goals  Recommendations for other services: Neuropsych  Discharge Criteria: Patient will be discharged from PT if patient refuses treatment 3 consecutive times without medical reason, if treatment goals  not met, if there is a change in medical status, if patient makes no progress towards goals or if patient is discharged from hospital.  The above assessment, treatment plan, treatment alternatives and goals were discussed and mutually agreed upon: by patient  Juanna Cao, PT, DPT, CBIS  01/07/2020, 3:36 PM

## 2020-01-08 ENCOUNTER — Inpatient Hospital Stay (HOSPITAL_COMMUNITY): Payer: PPO | Admitting: Physical Therapy

## 2020-01-08 ENCOUNTER — Inpatient Hospital Stay (HOSPITAL_COMMUNITY): Payer: PPO | Admitting: Occupational Therapy

## 2020-01-08 DIAGNOSIS — D62 Acute posthemorrhagic anemia: Secondary | ICD-10-CM

## 2020-01-08 DIAGNOSIS — I5043 Acute on chronic combined systolic (congestive) and diastolic (congestive) heart failure: Secondary | ICD-10-CM

## 2020-01-08 DIAGNOSIS — I6310 Cerebral infarction due to embolism of unspecified precerebral artery: Secondary | ICD-10-CM

## 2020-01-08 DIAGNOSIS — G479 Sleep disorder, unspecified: Secondary | ICD-10-CM

## 2020-01-08 DIAGNOSIS — I10 Essential (primary) hypertension: Secondary | ICD-10-CM

## 2020-01-08 DIAGNOSIS — I5042 Chronic combined systolic (congestive) and diastolic (congestive) heart failure: Secondary | ICD-10-CM

## 2020-01-08 DIAGNOSIS — E46 Unspecified protein-calorie malnutrition: Secondary | ICD-10-CM

## 2020-01-08 DIAGNOSIS — G441 Vascular headache, not elsewhere classified: Secondary | ICD-10-CM

## 2020-01-08 DIAGNOSIS — N400 Enlarged prostate without lower urinary tract symptoms: Secondary | ICD-10-CM

## 2020-01-08 DIAGNOSIS — E8809 Other disorders of plasma-protein metabolism, not elsewhere classified: Secondary | ICD-10-CM

## 2020-01-08 MED ORDER — OXYCODONE HCL ER 10 MG PO T12A
20.0000 mg | EXTENDED_RELEASE_TABLET | Freq: Two times a day (BID) | ORAL | Status: DC
  Administered 2020-01-08 – 2020-01-12 (×8): 20 mg via ORAL
  Filled 2020-01-08 (×8): qty 2

## 2020-01-08 MED ORDER — POLYETHYLENE GLYCOL 3350 17 G PO PACK
17.0000 g | PACK | Freq: Two times a day (BID) | ORAL | Status: DC
  Administered 2020-01-08 – 2020-01-12 (×9): 17 g via ORAL
  Filled 2020-01-08 (×8): qty 1

## 2020-01-08 MED ORDER — PRO-STAT SUGAR FREE PO LIQD
30.0000 mL | Freq: Two times a day (BID) | ORAL | Status: DC
  Administered 2020-01-08 – 2020-01-10 (×4): 30 mL via ORAL
  Filled 2020-01-08 (×8): qty 30

## 2020-01-08 NOTE — Progress Notes (Signed)
PHYSICAL MEDICINE & REHABILITATION PROGRESS NOTE   Subjective/Complaints: Patient seen laying in bed this morning.  He states he slept well overnight.  He states he had good first day of therapies yesterday.  Overnight, called by nursing regarding headache and difficulty sleeping.  ROS: Denies CP, SOB, N/V/D   Objective:   No results found. Recent Labs    01/07/20 0550  WBC 9.5  HGB 12.3*  HCT 40.1  PLT 220   Recent Labs    01/07/20 0550  NA 140  K 4.3  CL 108  CO2 21*  GLUCOSE 105*  BUN 15  CREATININE 1.18  CALCIUM 8.8*    Intake/Output Summary (Last 24 hours) at 01/08/2020 1150 Last data filed at 01/08/2020 [id number] Gross per 24 hour  Intake 480 ml  Output --  Net 480 ml     Physical Exam: Vital Signs Blood pressure 120/73, pulse 65, temperature 98.2 F (36.8 C), resp. rate 18, height 5\' 11"  (1.803 m), weight 63.7 kg, SpO2 98 %. Constitutional: No distress . Vital signs reviewed. HENT: Normocephalic.  Atraumatic. Eyes: EOMI. No discharge. Cardiovascular: No JVD. Respiratory: Normal effort.  No stridor. GI: Non-distended. Skin: Warm and dry.  Intact. Psych: Normal mood.  Normal behavior. Musc: No edema in extremities.  No tenderness in extremities. Neuro: Alert Motor: 5/5 throughout  Assessment/Plan: 1. Functional deficits secondary to bicerebral cardioembolic infarcts which require 3+ hours per day of interdisciplinary therapy in a comprehensive inpatient rehab setting.  Physiatrist is providing close team supervision and 24 hour management of active medical problems listed below.  Physiatrist and rehab team continue to assess barriers to discharge/monitor patient progress toward functional and medical goals  Care Tool:  Bathing    Body parts bathed by patient: Right arm, Face, Left arm, Chest, Abdomen, Front perineal area, Buttocks, Right upper leg, Left upper leg, Right lower leg, Left lower leg         Bathing assist Assist Level:  Supervision/Verbal cueing     Upper Body Dressing/Undressing Upper body dressing   What is the patient wearing?: Pull over shirt    Upper body assist Assist Level: Supervision/Verbal cueing    Lower Body Dressing/Undressing Lower body dressing      What is the patient wearing?: Pants, Underwear/pull up     Lower body assist Assist for lower body dressing: Supervision/Verbal cueing     Toileting Toileting    Toileting assist Assist for toileting: Supervision/Verbal cueing     Transfers Chair/bed transfer  Transfers assist     Chair/bed transfer assist level: Supervision/Verbal cueing     Locomotion Ambulation   Ambulation assist      Assist level: Contact Guard/Touching assist Assistive device: No Device Max distance: 200'   Walk 10 feet activity   Assist     Assist level: Supervision/Verbal cueing Assistive device: No Device   Walk 50 feet activity   Assist    Assist level: Contact Guard/Touching assist Assistive device: No Device    Walk 150 feet activity   Assist    Assist level: Contact Guard/Touching assist Assistive device: No Device    Walk 10 feet on uneven surface  activity   Assist     Assist level: Contact Guard/Touching assist     Wheelchair     Assist Will patient use wheelchair at discharge?: No             Wheelchair 50 feet with 2 turns activity    Assist  Wheelchair 150 feet activity     Assist          Blood pressure 120/73, pulse 65, temperature 98.2 F (36.8 C), resp. rate 18, height 5\' 11"  (1.803 m), weight 63.7 kg, SpO2 98 %.  Medical Problem List and Plan: 1.  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  Antithrombotics: -DVT/anticoagulation: SCDs             -antiplatelet therapy: Aspirin 81 mg daily and Plavix 75 mg daily 3. Pain Management/chronic pain: Oxycodone 20 mg  every 12 hours as needed will try to keep on lower than home dose due to cognitive effects and fall risk  Topamax added on 5/ 4. Mood: Lamictal 200 mg daily, amantadine 50 mg twice daily, Prozac 10 mg daily, Klonopin 1 mg twice daily             -antipsychotic agents: Abilify 10 mg daily 5. Neuropsych: This patient is capable of making decisions on his own behalf. 6. Skin/Wound Care: Routine skin checks 7. Fluids/Electrolytes/Nutrition: Routine in and outs 8.  Wide-complex tachycardia.  Continue amiodarone 200 mg daily.  Follow-up cardiology services 9.  Acute on chronic combined systolic diastolic congestive heart failure.  Monitor for any signs of fluid overload Filed Weights   01/06/20 1729  Weight: 63.7 kg   Daily weights 10.  Hypertension.  Coreg 3.125 mg twice daily.  Monitor with increased mobility Vitals:   01/07/20 1913 01/08/20 0454  BP: 114/64 120/73  Pulse: 75 65  Resp: 16 18  Temp: 97.9 F (36.6 C) 98.2 F (36.8 C)  SpO2: 97% 98%    Controlled on 5/1 11.  COPD.  Continue nebulizer as needed.  Monitor oxygen saturations. 12.  Polysubstance abuse.  Urine drug screen positive cocaine.  Provide counseling 13.  BPH.  Flomax 0.4 mg daily.    PVRs ordered 14.  Hyperlipidemia.    Lipitor 15.  Acute blood loss anemia  Hemoglobin 12.3 on 4/30 16.  Hypoalbuminemia  Supplement initiated on 5/1  17.  Sleep disturbance  Melatonin ordered  LOS: 2 days A FACE TO FACE EVALUATION WAS PERFORMED  Heavenlee Maiorana Lorie Phenix 01/08/2020, 11:50 AM

## 2020-01-08 NOTE — Progress Notes (Signed)
Occupational Therapy Session Note  Patient Details  Name: Travis Salinas MRN: [id number] Date of Birth: 1951/01/29  Today's Date: 01/08/2020 OT Individual Time: [id number] OT Individual Time Calculation (min): 60 min   Short Term Goals: Week 1:  OT Short Term Goal 1 (Week 1): LTG =STG OT Short Term Goal 2 (Week 1): BASIC ADLS at I level OT Short Term Goal 3 (Week 1): IADLs at distant supervision  Skilled Therapeutic Interventions/Progress Updates:    Pt greeted standing at the doorway of his restroom, just finished using the bathroom unsupervised. He ambulated to EOB without AD and supervision assist. Pt agreeable to shower today. Worked on functional problem solving by having pt set up his ADL. Pt able to retrieve all needed dressing items but needed questioning cues for setting up the shower. Pt then completed bathing with setup assistance. OT educated pt to don gripper socks prior to exiting shower but he refused, stating "I got this" when beginning to dress while standing. OT advised siting to thread LEs into pants with pt once again refusing. Noted Lt lateral LOB with pt using the wall for balance correction. He then had 1 balance loss posteriorly with OT providing Min A to correct. We openly discussed importance of implementing safety methods during ADL routine to decrease his fall risk. Pt appeared receptive to this information, regrettable about balance loses during session. He then donned his footwear while seated. Oral care and grooming tasks including shaving completed while standing at the sink at independent level. Continued working on functional problem solving by having pt engage in IADL task of bedmaking. He ambulated without AD and supervision assistance to the linen closet, able to state what we needed for task without cuing. Once back in the room, he needed increased time and Min A to apply the fitted sheet. Pt visibly frustrated that this was so difficult for him, increased time  also to don a clean pillowcase. Pt then returned to his recliner, awaiting his lunch tray. Left him with all needs within reach and chair alarm set.    Therapy Documentation Precautions:  Precautions Precautions: Fall Restrictions Weight Bearing Restrictions: No Pain: no c/o pain during tx   ADL: ADL Eating: Independent Grooming: Independent Upper Body Bathing: Supervision/safety Where Assessed-Upper Body Bathing: Shower Lower Body Bathing: Supervision/safety Where Assessed-Lower Body Bathing: Shower Upper Body Dressing: Supervision/safety Lower Body Dressing: Supervision/safety Toileting: Supervision/safety Where Assessed-Toileting: Glass blower/designer: Distant supervision Armed forces technical officer Method: Magazine features editor: Distant Buyer, retail Method: Ambulating     Therapy/Group: Individual Therapy  Yilin Weedon A Morgen Linebaugh 01/08/2020, 12:52 PM

## 2020-01-08 NOTE — Progress Notes (Signed)
Physical Therapy Session Note  Patient Details  Name: Travis Salinas MRN: [id number] Date of Birth: 1950-11-15  Today's Date: 01/08/2020 PT Individual Time: 3128-1188 and 1400-1455 PT Individual Time Calculation (min): 68 min and 55 min   Short Term Goals: Week 1:  PT Short Term Goal 1 (Week 1): = LTGs  Skilled Therapeutic Interventions/Progress Updates:  Session 1 Pt received supine in bed and agreeable to PT. Supine>sit transfer without assist or cues . Pt able to don shoes sitting EOB.   Gait training with distant supervision assist from PT for safety throughout rehab unit. No LOB noted, but required min for attention to task and awareness of obstacles on the L.   Dynamic balance training:  [place] level B: Forward/reverse 3x 10  Side stepping 3 x 10 ft each direction Figure 8 5 ft x 4 Tandem stance with no UE support 10 sec x 4 bil SLS with 1-0UE 10 sec x 4 bil. Sit<>stand x 10 without UE support.   Agility ladder:  1 foot in reach space Side steeping. 2 feet In/out.  SLS 3sec hold.   Min assist through dynamic balance training with min cues for improved weight shift using ankle strategy, and step height to prevent foot drag  In the LLE.     Patient returned to room and performed stand pivot to recliner with no AD and supervision assist. Pt left sitting in recliner with call bell in reach and all needs met.      Session 2 Pt received sitting in WC and agreeable to PT. Pt instructed in dynamic gait training without AD through simulated community environments including hall of hospital, cement side walk at main entrance and [facility] as well food  Court in hosptial atrium. Pt able to tolerate up to 921f at a time prior to requiring rest break due to SOB. Pt noted to have increased foot drag on the L when fatigued. Cues for safety awareness to prevent foot drag on change in surface and to improve attention the LLE and reduce fall risk when fatigued. Patient returned to room and  performed ambulatory to recliner with distant supervision assist for safety. Pt left sitting in recliner with call bell in reach and all needs met.         Therapy Documentation Precautions:  Precautions Precautions: Fall Restrictions Weight Bearing Restrictions: No Vital Signs: Therapy Vitals Temp: (!) 97.4 F (36.3 C) Temp Source: Oral Pulse Rate: 80 Resp: 20 BP: 130/72 Patient Position (if appropriate): Sitting Oxygen Therapy SpO2: 99 % O2 Device: Room Air Pain: Pain Assessment Pain Scale: 0-10 Pain Score: 6  Pain Type: Chronic pain Pain Location: Neck Pain Orientation: Posterior Pain Descriptors / Indicators: Stabbing Pain Frequency: Constant Pain Onset: On-going Pain Intervention(s): Medication (See eMAR)    Therapy/Group: Individual Therapy  ALorie Phenix5/09/2019, 4:01 PM

## 2020-01-09 ENCOUNTER — Inpatient Hospital Stay (HOSPITAL_COMMUNITY): Payer: PPO | Admitting: Occupational Therapy

## 2020-01-09 DIAGNOSIS — G894 Chronic pain syndrome: Secondary | ICD-10-CM

## 2020-01-09 NOTE — Progress Notes (Signed)
Occupational Therapy Session Note  Patient Details  Name: Travis Salinas MRN: [id number] Date of Birth: [birth year]  Today's Date: 01/09/2020 OT Individual Time: 1000-1030 OT Individual Time Calculation (min): 30 min    Short Term Goals: Week 1:  OT Short Term Goal 1 (Week 1): LTG =STG OT Short Term Goal 2 (Week 1): BASIC ADLS at I level OT Short Term Goal 3 (Week 1): IADLs at distant supervision  Skilled Therapeutic Interventions/Progress Updates:  Patient met lying supine in bed in agreement with OT treatment session with focus on self-care re-education, organization with reference to ADLs, and dynamic standing balance as detailed below. Patient with request to complete bathing requiring supervision A for ADL item retrieval with 1-3 vc's. Toilet transfer and toileting/hygiene/clothing management with patient continent of bladder. Patient completed bathing in sitting/standing with use of shower chair and supervision A with occasional vc's for safety. Light housekeeping task in patients room with focus on dynamic standing balance without AD. Patient made bed, gathered dirty clothes and placed in laundry bag, and picked up loose items off the floor with supervision A. Patient notes being a little unsteady on his feet. Session concluded with patient seated in recliner with call bell within reach, bed alarm activated, and all needs met.   Therapy Documentation Precautions:  Precautions Precautions: Fall Restrictions Weight Bearing Restrictions: No  Therapy/Group: Individual Therapy  Markies Mowatt R Howerton-Davis 01/09/2020, 7:18 AM

## 2020-01-09 NOTE — Progress Notes (Addendum)
[facility] PHYSICAL MEDICINE & REHABILITATION PROGRESS NOTE   Subjective/Complaints: Patient seen laying in bed this morning.  He states he slept well overnight.  He is questions about when he can go home.  ROS: Denies CP, SOB, N/V/D   Objective:   No results found. Recent Labs    01/07/20 0550  WBC 9.5  HGB 12.3*  HCT 40.1  PLT 220   Recent Labs    01/07/20 0550  NA 140  K 4.3  CL 108  CO2 21*  GLUCOSE 105*  BUN 15  CREATININE 1.18  CALCIUM 8.8*    Intake/Output Summary (Last 24 hours) at 01/09/2020 0856 Last data filed at 01/08/2020 1905 Gross per 24 hour  Intake 440 ml  Output --  Net 440 ml     Physical Exam: Vital Signs Blood pressure (!) 111/49, pulse 69, temperature 97.6 F (36.4 C), resp. rate 18, height 5\' 11"  (1.803 m), weight 63.5 kg, SpO2 99 %. Constitutional: No distress . Vital signs reviewed. HENT: Normocephalic.  Atraumatic. Eyes: EOMI. No discharge. Cardiovascular: No JVD. Respiratory: Normal effort.  No stridor. GI: Non-distended. Skin: Warm and dry.  Intact. Psych: Normal mood.  Normal behavior. Musc: No edema in extremities.  No tenderness in extremities. Neuro: Alert HOH Motor: 5/5 throughout, unchanged  Assessment/Plan: 1. Functional deficits secondary to bicerebral cardioembolic infarcts which require 3+ hours per day of interdisciplinary therapy in a comprehensive inpatient rehab setting.  Physiatrist is providing close team supervision and 24 hour management of active medical problems listed below.  Physiatrist and rehab team continue to assess barriers to discharge/monitor patient progress toward functional and medical goals  Care Tool:  Bathing    Body parts bathed by patient: Right arm, Face, Left arm, Chest, Abdomen, Front perineal area, Buttocks, Right upper leg, Left upper leg, Right lower leg, Left lower leg         Bathing assist Assist Level: Supervision/Verbal cueing     Upper Body Dressing/Undressing Upper  body dressing   What is the patient wearing?: Pull over shirt    Upper body assist Assist Level: Supervision/Verbal cueing    Lower Body Dressing/Undressing Lower body dressing      What is the patient wearing?: Pants, Underwear/pull up     Lower body assist Assist for lower body dressing: Independent     Toileting Toileting    Toileting assist Assist for toileting: Supervision/Verbal cueing     Transfers Chair/bed transfer  Transfers assist     Chair/bed transfer assist level: Supervision/Verbal cueing     Locomotion Ambulation   Ambulation assist      Assist level: Contact Guard/Touching assist Assistive device: No Device Max distance: 200'   Walk 10 feet activity   Assist     Assist level: Supervision/Verbal cueing Assistive device: No Device   Walk 50 feet activity   Assist    Assist level: Contact Guard/Touching assist Assistive device: No Device    Walk 150 feet activity   Assist    Assist level: Contact Guard/Touching assist Assistive device: No Device    Walk 10 feet on uneven surface  activity   Assist     Assist level: Contact Guard/Touching assist     Wheelchair     Assist Will patient use wheelchair at discharge?: No             Wheelchair 50 feet with 2 turns activity    Assist            Wheelchair 150  feet activity     Assist          Blood pressure (!) 111/49, pulse 69, temperature 97.6 F (36.4 C), resp. rate 18, height 5\' 11"  (1.803 m), weight 63.5 kg, SpO2 99 %.  Medical Problem List and Plan: 1.  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  Antithrombotics: -DVT/anticoagulation: SCDs             -antiplatelet therapy: Aspirin 81 mg daily and Plavix 75 mg daily 3. Pain Management/chronic pain:   ?Oxycodone 20 mg every 12 hours as needed will try to keep on lower than home dose due to  cognitive effects and fall risk   Will need follow-up in clinic by medications PTA and wean during hospital stay  Topamax added on 4/30, with improvement 4. Mood: Lamictal 200 mg daily, amantadine 50 mg twice daily, Prozac 10 mg daily, Klonopin 1 mg twice daily             -antipsychotic agents: Abilify 10 mg daily 5. Neuropsych: This patient is capable of making decisions on his own behalf. 6. Skin/Wound Care: Routine skin checks 7. Fluids/Electrolytes/Nutrition: Routine in and outs 8.  Wide-complex tachycardia.  Continue amiodarone 200 mg daily.  Follow-up cardiology services 9.  Acute on chronic combined systolic diastolic congestive heart failure.  Monitor for any signs of fluid overload Filed Weights   01/06/20 1729 01/09/20 0514  Weight: 63.7 kg 63.5 kg   Stable on 5/2 10.  Hypertension.  Coreg 3.125 mg twice daily.  Monitor with increased mobility Vitals:   01/08/20 2024 01/09/20 0516  BP: (!) 125/59 (!) 111/49  Pulse: 74 69  Resp: 18 18  Temp: 98 F (36.7 C) 97.6 F (36.4 C)  SpO2: 97% 99%    Controlled on 5/2 11.  COPD.  Continue nebulizer as needed.  Monitor oxygen saturations. 12.  Polysubstance abuse.  Urine drug screen positive cocaine.  Provide counseling 13.  BPH.  Flomax 0.4 mg daily.    PVRs unremarkable 14.  Hyperlipidemia.    Lipitor 15.  Acute blood loss anemia  Hemoglobin 12.3 on 4/30 16.  Hypoalbuminemia  Supplement initiated on 5/1  17.  Sleep disturbance  Melatonin ordered, with improvement  LOS: 3 days A FACE TO FACE EVALUATION WAS PERFORMED  Maghan Jessee Lorie Phenix 01/09/2020, 8:56 AM

## 2020-01-10 ENCOUNTER — Inpatient Hospital Stay (HOSPITAL_COMMUNITY): Payer: PPO | Admitting: Physical Therapy

## 2020-01-10 ENCOUNTER — Inpatient Hospital Stay (HOSPITAL_COMMUNITY): Payer: PPO | Admitting: Occupational Therapy

## 2020-01-10 NOTE — Progress Notes (Signed)
Pt complained of dizziness to NT. Writer Advised NT to obtain VS. VS WNL. Spoke with pt on symptoms. "No nausea just a severe headache for the past 3 days. I may refuse therapy today because I feel like shit." Educated pt on importance of therapy. Advised pt to ring call bell if symptoms worsen.

## 2020-01-10 NOTE — Progress Notes (Signed)
Physical Therapy Session Note  Patient Details  Name: Travis Salinas MRN: [id number] Date of Birth: [birth year]  Today's Date: 01/10/2020 PT Individual Time: 9957-9009 PT Individual Time Calculation (min): 60 min   Short Term Goals: Week 1:  PT Short Term Goal 1 (Week 1): = LTGs  Skilled Therapeutic Interventions/Progress Updates: Pt presented in bed with ex-[name] present agreeable to therapy. Session focused on higher level balance and education to ex-wife. Pt states HA has much improved from earlier am. Pt performed bed mobility mod I and donned shoes with set up. Pt ambulated to rehab gym distant supervision no AD. Discussed with pt and [name] importance of energy conservation and that body still going through a healing process and that noted most LOB recorded has been with fatigue. Pt participated in agility ladder side stepping, in/outs, forward/backward zig-zags with x 2 LOB posteriorly with one requiring minA for correction but pt able to self correct other. Pt also with x 1 LOB when turning to R however pt was also able to correct that one. Pt also participated in fall recovery from mat with pt able to come to standing with supervision and without furniture assist. Participated in toe taps to 6in step no AD with some minor R ankle instability noted and x 1 LOB to R requiring minA from PTA for recovery. PTA provided edu to ex regarding monitoring for fatigue and noted that LOB most often occur with fatigue with her verbalizing understanding. Pt ambulated back to room at end of session in same manner as prior and pt used bathroom prior to returning to bed distant supervision. Pt returned to bed at end of session and left with bed alarm on, call bell within reach and needs met.      Therapy Documentation Precautions:  Precautions Precautions: Fall Restrictions Weight Bearing Restrictions: No General:   Vital Signs: Therapy Vitals Temp: 98 F (36.7 C) Pulse Rate: 63 Resp:  17 BP: 113/70 Patient Position (if appropriate): Lying Oxygen Therapy SpO2: 98 % O2 Device: Room Air   Therapy/Group: Individual Therapy  [name]  [name], PTA  01/10/2020, 4:10 PM

## 2020-01-10 NOTE — Progress Notes (Signed)
Occupational Therapy Session Note  Patient Details  Name: Travis Salinas MRN: [id number] Date of Birth: Feb 08, 1951  Today's Date: 01/10/2020 OT Individual Time: 1345-1454 OT Individual Time Calculation (min): 69 min    Short Term Goals: Week 1:  OT Short Term Goal 1 (Week 1): LTG =STG OT Short Term Goal 2 (Week 1): BASIC ADLS at I level OT Short Term Goal 3 (Week 1): IADLs at distant supervision  Skilled Therapeutic Interventions/Progress Updates:    Upon entering the room, pt supine in bed and agreeable to OT intervention. Pt requesting to shave this session. Pt standing from bed level and obtaining all needed items from dresser with supervision overall. Pt standing at sink for grooming tasks for 10 minutes independently. Pt ambulating without use of AD to ADL apartment and taking seated rest break as needed this session. Pt performed floor transfer with supervision overall. Pt ambulating to dresser and pulling out needed items to make bed. Pt carrying items in arms without issue and making bed without LOB. Pt taking seated rest break and then removing all items, folding them back up, and placing back into dresser. Pt ambulating back to room in same manner as above. OT provided pt with paper handout regarding energy conservation and discussed how to engage in this at home for safety. Pt verbalized understanding but education to continue. Pt reports increase in headache at this time and OT darkening room per pt requesting and notifying RN. Bed alarm activated and call bell within reach.   Therapy Documentation Precautions:  Precautions Precautions: Fall Restrictions Weight Bearing Restrictions: No General:   Vital Signs: Therapy Vitals Temp: 98 F (36.7 C) Pulse Rate: 63 Resp: 17 BP: 113/70 Patient Position (if appropriate): Lying Oxygen Therapy SpO2: 98 % O2 Device: Room Air Pain:   ADL: ADL Eating: Independent Grooming: Independent Upper Body Bathing:  Supervision/safety Where Assessed-Upper Body Bathing: Shower Lower Body Bathing: Supervision/safety Where Assessed-Lower Body Bathing: Shower Upper Body Dressing: Supervision/safety Lower Body Dressing: Supervision/safety Toileting: Supervision/safety Where Assessed-Toileting: Glass blower/designer: Distant supervision Armed forces technical officer Method: Magazine features editor: Distant Buyer, retail Method: Ambulating   Therapy/Group: Individual Therapy  Gypsy Decant 01/10/2020, 2:56 PM

## 2020-01-10 NOTE — Progress Notes (Signed)
Occupational Therapy Session Note  Patient Details  Name: Travis Salinas MRN: [id number] Date of Birth: Dec 28, 1950  Today's Date: 01/10/2020 OT Individual Time: [id number] Individual Time Calculation (min): 58 min   Short Term Goals: Week 1:  OT Short Term Goal 1 (Week 1): LTG =STG OT Short Term Goal 2 (Week 1): BASIC ADLS at I level OT Short Term Goal 3 (Week 1): IADLs at distant supervision  Skilled Therapeutic Interventions/Progress Updates:    Pt greeted in bed with c/o HA, however amenable to shower after he drank his coffee. Supine<sit from flat bed without bedrails per home setup completed unassisted. Pt first completed toilet transfer and toileting at independent level with continent B+B void. After handwashing at the sink, pt ambulated to the family room area without AD and supervision assistance. He completed simple meal prep task of coffee making with min vcs for using Keurig machine. Pt then carried his coffee back to room, also grabbed a newspaper on the way. While drinking his coffee we discussed shower DME for home. Pt reports he already has a built in seat in the walk-in shower at home that he can safely use. After pt finished, he required min cuing for safety and setup before taking his shower. OT also had to adjust the [facility] shower hose as pt had a tough time problem solving how to undo the twist in the hose. When pt finished, he needed vcs to dry feet seated vs standing and also to don gripper socks before transitioning to toilet to dress. Note that he gathered all needed clothing items from dresser beforehand. Min cuing for sitting to thread LEs into underwear and pants vs attempting to do this in standing. Pt then ambulated to the sink and completed oral care and hair combing in standing. He returned to bed to drink his coffee, note 1 posterior LOB onto bed during transfer. We discussed interventions that we could implement to help with his HA. Pt reported also having neck pain  due to positioning in bed while he slept last night. RN in during session to provide tylenol. OT also fashioned pt a makeshift neck pillow with lavender scented cotton balls inside so that he could comfortably rest before his other therapies today. Pt appreciative. He was left in bed with all needs within reach and bed alarm set.   Therapy Documentation Precautions:  Precautions Precautions: Fall Restrictions Weight Bearing Restrictions: No ADL: ADL Eating: Independent Grooming: Independent Upper Body Bathing: Supervision/safety Where Assessed-Upper Body Bathing: Shower Lower Body Bathing: Supervision/safety Where Assessed-Lower Body Bathing: Shower Upper Body Dressing: Supervision/safety Lower Body Dressing: Supervision/safety Toileting: Supervision/safety Where Assessed-Toileting: Glass blower/designer: Distant supervision Armed forces technical officer Method: Magazine features editor: Distant Buyer, retail Method: Ambulating      Therapy/Group: Individual Therapy  Domique Reardon A Nakyia Dau 01/10/2020, 12:18 PM

## 2020-01-10 NOTE — Progress Notes (Signed)
[facility] PHYSICAL MEDICINE & REHABILITATION PROGRESS NOTE   Subjective/Complaints: HA earlier today but doing better now.  Wife at bedside, states she can provide 24/7 supervision, retired Therapist, sports  ROS: Denies CP, SOB, N/V/D   Objective:   No results found. No results for input(s): WBC, HGB, HCT, PLT in the last 72 hours. No results for input(s): NA, K, CL, CO2, GLUCOSE, BUN, CREATININE, CALCIUM in the last 72 hours.  Intake/Output Summary (Last 24 hours) at 01/10/2020 0935 Last data filed at 01/09/2020 1904 Gross per 24 hour  Intake 600 ml  Output --  Net 600 ml     Physical Exam: Vital Signs Blood pressure 127/70, pulse 69, temperature (!) 97.5 F (36.4 C), temperature source Oral, resp. rate 18, height 5\' 11"  (1.803 m), weight 63 kg, SpO2 98 %. Constitutional: No distress . Vital signs reviewed.  General: No acute distress Mood and affect are appropriate Heart: Regular rate and rhythm no rubs murmurs or extra sounds Lungs: Clear to auscultation, breathing unlabored, no rales or wheezes Abdomen: Positive bowel sounds, soft nontender to palpation, nondistended Extremities: No clubbing, cyanosis, or edema Skin: No evidence of breakdown, no evidence of rash Neurologic: Cranial nerves II through XII intact, motor strength is 5/5 in bilateral deltoid, bicep, tricep, grip, hip flexor, knee extensors, ankle dorsiflexor and plantar flexor Sensory exam normal sensation to light touch and proprioception in bilateral upper and lower extremities Cerebellar exam normal finger to nose to finger as well as heel to shin in bilateral upper and lower extremities Musculoskeletal: Full range of motion in all 4 extremities. No joint swelling   Assessment/Plan: 1. Functional deficits secondary to bicerebral cardioembolic infarcts which require 3+ hours per day of interdisciplinary therapy in a comprehensive inpatient rehab setting.  Physiatrist is providing close team supervision and 24 hour  management of active medical problems listed below.  Physiatrist and rehab team continue to assess barriers to discharge/monitor patient progress toward functional and medical goals  Care Tool:  Bathing    Body parts bathed by patient: Right arm, Face, Left arm, Chest, Abdomen, Front perineal area, Buttocks, Right upper leg, Left upper leg, Right lower leg, Left lower leg         Bathing assist Assist Level: Set up assist     Upper Body Dressing/Undressing Upper body dressing   What is the patient wearing?: Pull over shirt    Upper body assist Assist Level: Independent    Lower Body Dressing/Undressing Lower body dressing      What is the patient wearing?: Pants, Underwear/pull up     Lower body assist Assist for lower body dressing: Supervision/Verbal cueing     Toileting Toileting    Toileting assist Assist for toileting: Independent     Transfers Chair/bed transfer  Transfers assist     Chair/bed transfer assist level: Supervision/Verbal cueing     Locomotion Ambulation   Ambulation assist      Assist level: Contact Guard/Touching assist Assistive device: No Device Max distance: 200'   Walk 10 feet activity   Assist     Assist level: Supervision/Verbal cueing Assistive device: No Device   Walk 50 feet activity   Assist    Assist level: Contact Guard/Touching assist Assistive device: No Device    Walk 150 feet activity   Assist    Assist level: Contact Guard/Touching assist Assistive device: No Device    Walk 10 feet on uneven surface  activity   Assist     Assist level: Contact  Guard/Touching assist     Wheelchair     Assist Will patient use wheelchair at discharge?: No             Wheelchair 50 feet with 2 turns activity    Assist            Wheelchair 150 feet activity     Assist          Blood pressure 127/70, pulse 69, temperature (!) 97.5 F (36.4 C), temperature source Oral,  resp. rate 18, height 5\' 11"  (1.803 m), weight 63 kg, SpO2 98 %.  Medical Problem List and Plan: 1.  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  Antithrombotics: -DVT/anticoagulation: SCDs             -antiplatelet therapy: Aspirin 81 mg daily and Plavix 75 mg daily 3. Pain Management/chronic pain:   ?Oxycodone 20 mg every 12 hours as needed will try to keep on lower than home dose due to cognitive effects and fall risk   Will need follow-up in clinic by medications PTA and wean during hospital stay  Topamax added on 4/30, with improvement 4. Mood: Lamictal 200 mg daily, amantadine 50 mg twice daily, Prozac 10 mg daily, Klonopin 1 mg twice daily             -antipsychotic agents: Abilify 10 mg daily 5. Neuropsych: This patient is capable of making decisions on his own behalf. 6. Skin/Wound Care: Routine skin checks 7. Fluids/Electrolytes/Nutrition: Routine in and outs 8.  Wide-complex tachycardia.  Continue amiodarone 200 mg daily.  Follow-up cardiology services 9.  Acute on chronic combined systolic diastolic congestive heart failure.  Monitor for any signs of fluid overload Filed Weights   01/06/20 1729 01/09/20 0514 01/09/20 1946  Weight: 63.7 kg 63.5 kg 63 kg   Stable on 5/3 10.  Hypertension.  Coreg 3.125 mg twice daily.  Monitor with increased mobility Vitals:   01/09/20 1542 01/09/20 1946  BP: 111/76 127/70  Pulse: 67 69  Resp: 20 18  Temp: 97.6 F (36.4 C) (!) 97.5 F (36.4 C)  SpO2: 100% 98%    Controlled on 5/3 11.  COPD.  Continue nebulizer as needed.  Monitor oxygen saturations. 12.  Polysubstance abuse.  Urine drug screen positive cocaine.  Provide counseling Neuropsych eval  13.  BPH.  Flomax 0.4 mg daily.    PVRs unremarkable 14.  Hyperlipidemia.    Lipitor 15.  Acute blood loss anemia  Hemoglobin 12.3 on 4/30 16.  Hypoalbuminemia  Supplement initiated on 5/1  17.   Sleep disturbance  Melatonin ordered, with improvement  LOS: 4 days A FACE TO FACE EVALUATION WAS PERFORMED  Travis Salinas 01/10/2020, 9:35 AM

## 2020-01-11 ENCOUNTER — Inpatient Hospital Stay (HOSPITAL_COMMUNITY): Payer: PPO | Admitting: Physical Therapy

## 2020-01-11 ENCOUNTER — Inpatient Hospital Stay (HOSPITAL_COMMUNITY): Payer: PPO | Admitting: Occupational Therapy

## 2020-01-11 MED ORDER — AMIODARONE HCL 200 MG PO TABS
200.0000 mg | ORAL_TABLET | Freq: Every day | ORAL | Status: DC
  Administered 2020-01-12: 200 mg via ORAL
  Filled 2020-01-11: qty 1

## 2020-01-11 MED ORDER — TOPIRAMATE 25 MG PO TABS: 25.0000 mg | ORAL_TABLET | Freq: Two times a day (BID) | ORAL | 0 refills | Status: DC

## 2020-01-11 MED ORDER — SILODOSIN 8 MG PO CAPS: 8.0000 mg | ORAL_CAPSULE | Freq: Every day | ORAL | 0 refills | Status: DC

## 2020-01-11 MED ORDER — ALBUTEROL SULFATE HFA 108 (90 BASE) MCG/ACT IN AERS: 2.0000 | INHALATION_SPRAY | Freq: Four times a day (QID) | RESPIRATORY_TRACT | 0 refills | Status: DC | PRN

## 2020-01-11 MED ORDER — CLONAZEPAM 1 MG PO TABS: 1.0000 mg | ORAL_TABLET | Freq: Two times a day (BID) | ORAL | 0 refills | Status: DC

## 2020-01-11 MED ORDER — MELATONIN 300 MCG PO TABS: 1.5000 mg | ORAL_TABLET | Freq: Every day | ORAL | 0 refills | Status: DC

## 2020-01-11 MED ORDER — POLYETHYLENE GLYCOL 3350 17 G PO PACK: 17.0000 g | PACK | Freq: Two times a day (BID) | ORAL | 0 refills | Status: DC

## 2020-01-11 MED ORDER — AMANTADINE HCL 50 MG/5ML PO SYRP: 50.0000 mg | ORAL_SOLUTION | Freq: Two times a day (BID) | ORAL | 0 refills | Status: DC

## 2020-01-11 MED ORDER — PANTOPRAZOLE SODIUM 40 MG PO TBEC: 40.0000 mg | DELAYED_RELEASE_TABLET | Freq: Every day | ORAL | 0 refills | Status: DC

## 2020-01-11 MED ORDER — LAMOTRIGINE 200 MG PO TABS: 200.0000 mg | ORAL_TABLET | Freq: Every morning | ORAL | 0 refills | Status: DC

## 2020-01-11 MED ORDER — FLUOXETINE HCL 10 MG PO TABS: 10.0000 mg | ORAL_TABLET | Freq: Every day | ORAL | 3 refills | Status: DC

## 2020-01-11 MED ORDER — ARIPIPRAZOLE 10 MG PO TABS: 10.0000 mg | ORAL_TABLET | Freq: Every day | ORAL | 0 refills | Status: DC

## 2020-01-11 MED ORDER — AMIODARONE HCL 200 MG PO TABS: 200.0000 mg | ORAL_TABLET | Freq: Every day | ORAL | 1 refills | Status: DC

## 2020-01-11 MED ORDER — TOPIRAMATE 25 MG PO TABS
25.0000 mg | ORAL_TABLET | Freq: Two times a day (BID) | ORAL | Status: DC
  Administered 2020-01-11 – 2020-01-12 (×2): 25 mg via ORAL
  Filled 2020-01-11 (×2): qty 1

## 2020-01-11 MED ORDER — ATORVASTATIN CALCIUM 40 MG PO TABS: 40.0000 mg | ORAL_TABLET | Freq: Every day | ORAL | 1 refills | Status: DC

## 2020-01-11 MED ORDER — ARIPIPRAZOLE 10 MG PO TABS: 10.0000 mg | ORAL_TABLET | Freq: Every day | ORAL | Status: DC

## 2020-01-11 MED ORDER — CLOPIDOGREL BISULFATE 75 MG PO TABS: 75.0000 mg | ORAL_TABLET | Freq: Every day | ORAL | 0 refills | Status: DC

## 2020-01-11 MED ORDER — CARVEDILOL 3.125 MG PO TABS: 3.1250 mg | ORAL_TABLET | Freq: Two times a day (BID) | ORAL | 1 refills | Status: DC

## 2020-01-11 NOTE — Discharge Summary (Addendum)
Physician Discharge Summary  Patient ID: Travis Salinas MRN: [id number] DOB/AGE: 1951-02-03 69 y.o.  Admit date: 01/06/2020 Discharge date: 01/12/2020  Discharge Diagnoses:  Principal Problem:   Stroke (cerebrum) [facility]) Active Problems:   Embolic cerebral infarction (HCC)   Hypoalbuminemia due to protein-calorie malnutrition (HCC)   Acute blood loss anemia   Benign prostatic hyperplasia   Vascular headache   Acute on chronic combined systolic and diastolic heart failure (HCC)   Sleep disturbance   Chronic pain syndrome DVT prophylaxis COPD Wide-complex tachycardia Hyperlipidemia CKD stage II Polysubstance abuse  Discharged Condition: Stable  Significant Diagnostic Studies: CT HEAD WO CONTRAST  Result Date: 01/03/2020 CLINICAL DATA:  Stroke, post tPA EXAM: CT HEAD WITHOUT CONTRAST TECHNIQUE: Contiguous axial images were obtained from the base of the skull through the vertex without intravenous contrast. Sagittal and coronal MPR images reconstructed from axial data set. COMPARISON:  01/01/2020 FINDINGS: Brain: Generalized atrophy. Normal ventricular morphology. No midline shift or mass effect. Small vessel chronic ischemic changes of deep cerebral white matter. No intracranial hemorrhage, mass lesion, evidence of acute infarction, or extra-axial fluid collection. Vascular: Atherosclerotic calcifications of internal carotid and vertebral arteries at skull base. No hyperdense vessels. Skull: Intact Sinuses/Orbits: Large mucosal retention cyst of the LEFT maxillary sinus unchanged. Remaining paranasal sinuses and mastoid air cells clear Other: N/A IMPRESSION: Atrophy with small vessel chronic ischemic changes of deep cerebral white matter. No acute intracranial abnormalities. Electronically Signed   By: Lavonia Dana M.D.   On: 01/03/2020 08:56   CT Cervical Spine Wo Contrast  Result Date: 01/01/2020 CLINICAL DATA:  Status post trauma. EXAM: CT CERVICAL SPINE WITHOUT CONTRAST TECHNIQUE:  Multidetector CT imaging of the cervical spine was performed without intravenous contrast. Multiplanar CT image reconstructions were also generated. COMPARISON:  None. FINDINGS: Alignment: Normal. Skull base and vertebrae: No acute fracture. No primary bone lesion or focal pathologic process. Soft tissues and spinal canal: No prevertebral fluid or swelling. No visible canal hematoma. Disc levels: Moderate to marked severity endplate sclerosis is seen at the levels of C5-C6 and C6-C7 with mild endplate sclerosis seen throughout the remainder of the cervical spine. Moderate to marked severity intervertebral disc space narrowing is seen at the levels of C5-C6 and C6-C7. Marked severity bilateral multilevel facet joint hypertrophy is seen. Upper chest: Moderate severity emphysematous lung disease is seen involving the bilateral upper lobes. Other: None. IMPRESSION: 1. No acute osseous abnormality of the cervical spine. 2. Moderate to marked severity multilevel degenerative changes, most prominent at the levels of C5-C6 and C6-C7. 3. Moderate severity emphysematous lung disease involving the bilateral upper lobes. Emphysema (ICD10-J43.9). Electronically Signed   By: Virgina Norfolk M.D.   On: 01/01/2020 21:51   MR ANGIO HEAD WO CONTRAST  Result Date: 01/03/2020 CLINICAL DATA:  Stroke, follow-up EXAM: MRI HEAD WITHOUT CONTRAST MRA HEAD WITHOUT CONTRAST TECHNIQUE: Multiplanar, multiecho pulse sequences of the brain and surrounding structures were obtained without intravenous contrast. Angiographic images of the head were obtained using MRA technique without contrast. COMPARISON:  MRI brain 2014 FINDINGS: MRI HEAD FINDINGS Brain: There is a small area restricted diffusion within the parasagittal right frontal lobe. Additional smaller areas restricted diffusion are present in bilateral frontal lobes, left parietal lobe, and bilateral cerebellar hemispheres. There is no evidence of intracranial hemorrhage. Prominence of  the ventricles and sulci reflects generalized parenchymal volume loss. Patchy foci of T2 hyperintensity in the supratentorial white matter nonspecific but may reflect mild to moderate chronic microvascular ischemic changes. There is  no intracranial mass or mass effect. There is no hydrocephalus or extra-axial fluid collection. Vascular: Major vessel flow voids at the skull base are preserved. Skull and upper cervical spine: Normal marrow signal is preserved. Sinuses/Orbits: Left maxillary sinus mucosal thickening. Orbits are unremarkable. Other: Sella is unremarkable.  Mastoid air cells are clear. MRA HEAD FINDINGS Intracranial internal carotid arteries are patent. Middle and anterior cerebral arteries are patent. Intracranial vertebral arteries, basilar artery, posterior cerebral arteries are patent. Right posterior communicating artery is present with fetal origin of the right PCA. There is no significant stenosis or aneurysm. IMPRESSION: Several small acute infarcts involving multiple vascular territories bilaterally suggesting a central source. No proximal intracranial vessel occlusion or significant stenosis. Chronic microvascular ischemic changes. Electronically Signed   By: Macy Mis M.D.   On: 01/03/2020 15:20   MR BRAIN WO CONTRAST  Result Date: 01/03/2020 CLINICAL DATA:  Stroke, follow-up EXAM: MRI HEAD WITHOUT CONTRAST MRA HEAD WITHOUT CONTRAST TECHNIQUE: Multiplanar, multiecho pulse sequences of the brain and surrounding structures were obtained without intravenous contrast. Angiographic images of the head were obtained using MRA technique without contrast. COMPARISON:  MRI brain 2014 FINDINGS: MRI HEAD FINDINGS Brain: There is a small area restricted diffusion within the parasagittal right frontal lobe. Additional smaller areas restricted diffusion are present in bilateral frontal lobes, left parietal lobe, and bilateral cerebellar hemispheres. There is no evidence of intracranial hemorrhage.  Prominence of the ventricles and sulci reflects generalized parenchymal volume loss. Patchy foci of T2 hyperintensity in the supratentorial white matter nonspecific but may reflect mild to moderate chronic microvascular ischemic changes. There is no intracranial mass or mass effect. There is no hydrocephalus or extra-axial fluid collection. Vascular: Major vessel flow voids at the skull base are preserved. Skull and upper cervical spine: Normal marrow signal is preserved. Sinuses/Orbits: Left maxillary sinus mucosal thickening. Orbits are unremarkable. Other: Sella is unremarkable.  Mastoid air cells are clear. MRA HEAD FINDINGS Intracranial internal carotid arteries are patent. Middle and anterior cerebral arteries are patent. Intracranial vertebral arteries, basilar artery, posterior cerebral arteries are patent. Right posterior communicating artery is present with fetal origin of the right PCA. There is no significant stenosis or aneurysm. IMPRESSION: Several small acute infarcts involving multiple vascular territories bilaterally suggesting a central source. No proximal intracranial vessel occlusion or significant stenosis. Chronic microvascular ischemic changes. Electronically Signed   By: Macy Mis M.D.   On: 01/03/2020 15:20   ECHOCARDIOGRAM COMPLETE  Result Date: 01/02/2020    ECHOCARDIOGRAM REPORT   Patient Name:   ERLAND NAGAR ST CLAIR Date of Exam: 01/02/2020 Medical Rec #:  [id number]          Height:       71.0 in Accession #:    [id number]         Weight:       150.0 lb Date of Birth:  January 05, 1951          BSA:          1.866 m Patient Age:    78 years           BP:           112/91 mmHg Patient Gender: M                  HR:           81 bpm. Exam Location:  Inpatient Procedure: 2D Echo, Color Doppler and Cardiac Doppler  MODIFIED REPORT:   This report was modified by Candee Furbish MD on 01/02/2020 due to [facility] comment                                     added.   Indications:     Stroke 434.91/II63.9  History:         Patient has prior history of Echocardiogram examinations, most                  recent 05/31/2021.  Sonographer:     Merrie Roof RDCS Referring Phys:  [id number] ERIC LINDZEN Diagnosing Phys: Candee Furbish MD IMPRESSIONS  1. Left ventricular ejection fraction, by estimation, is 35 to 40%. The left ventricle has moderately decreased function. The left ventricle demonstrates regional wall motion abnormalities (see scoring diagram/findings for description). There is moderate left ventricular hypertrophy. Left ventricular diastolic parameters are consistent with Grade I diastolic dysfunction (impaired relaxation). Has Takotsubo like appearance (stress induced cardiomyopathy).  2. Right ventricular systolic function is normal. The right ventricular size is normal.  3. The mitral valve is normal in structure. Trivial mitral valve regurgitation. No evidence of mitral stenosis.  4. The aortic valve is normal in structure. Aortic valve regurgitation is not visualized. No aortic stenosis is present.  5. The inferior vena cava is normal in size with greater than 50% respiratory variability, suggesting right atrial pressure of 3 mmHg. Comparison(s): The left ventricular function is worsened. LV function appeared normal in 2018. Conclusion(s)/Recommendation(s): No intracardiac source of embolism detected on this transthoracic study. A transesophageal echocardiogram is recommended to exclude cardiac source of embolism if clinically indicated. FINDINGS  Left Ventricle: Left ventricular ejection fraction, by estimation, is 35 to 40%. The left ventricle has moderately decreased function. The left ventricle demonstrates regional wall motion abnormalities. The left ventricular internal cavity size was normal in size. There is moderate left ventricular hypertrophy. Left ventricular diastolic parameters are consistent with Grade I diastolic dysfunction (impaired relaxation).  LV Wall  Scoring: The entire apex is akinetic. Has Takotsubo like appearance (stress induced cardiomyopathy). Right Ventricle: The right ventricular size is normal. No increase in right ventricular wall thickness. Right ventricular systolic function is normal. Left Atrium: Left atrial size was normal in size. Right Atrium: Right atrial size was normal in size. Pericardium: There is no evidence of pericardial effusion. Mitral Valve: The mitral valve is normal in structure. Normal mobility of the mitral valve leaflets. Trivial mitral valve regurgitation. No evidence of mitral valve stenosis. Tricuspid Valve: The tricuspid valve is normal in structure. Tricuspid valve regurgitation is not demonstrated. No evidence of tricuspid stenosis. Aortic Valve: The aortic valve is normal in structure. Aortic valve regurgitation is not visualized. No aortic stenosis is present. Pulmonic Valve: The pulmonic valve was normal in structure. Pulmonic valve regurgitation is mild. No evidence of pulmonic stenosis. Aorta: The aortic root is normal in size and structure. Venous: The inferior vena cava is normal in size with greater than 50% respiratory variability, suggesting right atrial pressure of 3 mmHg. IAS/Shunts: No atrial level shunt detected by color flow Doppler.  LEFT VENTRICLE PLAX 2D LVIDd:         4.60 cm     Diastology LVIDs:         3.00 cm     LV e' lateral:   5.55 cm/s LV PW:         1.50 cm  LV E/e' lateral: 9.6 LV IVS:        1.30 cm     LV e' medial:    6.64 cm/s LVOT diam:     2.10 cm     LV E/e' medial:  8.0 LV SV:         39 LV SV Index:   21 LVOT Area:     3.46 cm  LV Volumes (MOD) LV vol d, MOD A2C: 69.3 ml LV vol d, MOD A4C: 76.4 ml LV vol s, MOD A2C: 46.7 ml LV vol s, MOD A4C: 52.7 ml LV SV MOD A2C:     22.6 ml LV SV MOD A4C:     76.4 ml LV SV MOD BP:      23.3 ml RIGHT VENTRICLE             IVC RV Basal diam:  3.10 cm     IVC diam: 1.50 cm RV S prime:     11.90 cm/s TAPSE (M-mode): 2.0 cm LEFT ATRIUM              Index       RIGHT ATRIUM           Index LA diam:        2.70 cm 1.45 cm/m  RA Area:     13.50 cm LA Vol (A2C):   43.4 ml 23.26 ml/m RA Volume:   30.40 ml  16.29 ml/m LA Vol (A4C):   34.8 ml 18.65 ml/m LA Biplane Vol: 40.8 ml 21.87 ml/m  AORTIC VALVE LVOT Vmax:   62.00 cm/s LVOT Vmean:  44.500 cm/s LVOT VTI:    0.112 m  AORTA Ao Root diam: 3.70 cm Ao Asc diam:  3.30 cm MITRAL VALVE MV Area (PHT): 3.54 cm    SHUNTS MV Decel Time: 214 msec    Systemic VTI:  0.11 m MV E velocity: 53.10 cm/s  Systemic Diam: 2.10 cm MV A velocity: 63.80 cm/s MV E/A ratio:  0.83 Candee Furbish MD Electronically signed by Candee Furbish MD Signature Date/Time: 01/02/2020/3:35:22 PM    Final (Updated)    CT HEAD CODE STROKE WO CONTRAST  Result Date: 01/01/2020 CLINICAL DATA:  Code stroke.  Left-sided weakness. EXAM: CT HEAD WITHOUT CONTRAST TECHNIQUE: Contiguous axial images were obtained from the base of the skull through the vertex without intravenous contrast. COMPARISON:  MRI 07/12/2013 FINDINGS: Brain: Mild generalized atrophy. Chronic small-vessel ischemic changes of the white matter. No sign of acute infarction, mass lesion, hemorrhage, hydrocephalus or extra-axial collection. Vascular: There is atherosclerotic calcification of the major vessels at the base of the brain. Skull: Normal Sinuses/Orbits: Clear/normal. Insignificant retention cyst left maxillary sinus. Other: None ASPECTS (St[place] Stroke Program Early CT Score) - Ganglionic level infarction (caudate, lentiform nuclei, internal capsule, insula, M1-M3 cortex): 7 - Supraganglionic infarction (M4-M6 cortex): 3 Total score (0-10 with 10 being normal): 10 IMPRESSION: 1. No acute head CT finding. Chronic small-vessel changes of the white matter. 2. ASPECTS is 10 3. These results were communicated to Dr. Cheral Marker at 9:36 pmon 4/24/2021by text page via the [facility] messaging system. Electronically Signed   By: Nelson Chimes M.D.   On: 01/01/2020 21:37   CAROTID (at [facility] and [facility]  only)  Result Date: 01/03/2020 Carotid Arterial Duplex Study Indications:       Weakness and New onset of gait instability. Risk Factors:      Hypertension. Other Factors:     Cervical disc herniation. Limitations  Today's exam was limited due to movement, body habitus. Comparison Study:  No prior study on file Performing Technologist: Sharion Dove RVS  Examination Guidelines: A complete evaluation includes B-mode imaging, spectral Doppler, color Doppler, and power Doppler as needed of all accessible portions of each vessel. Bilateral testing is considered an integral part of a complete examination. Limited examinations for reoccurring indications may be performed as noted.  Right Carotid Findings: +----------+--------+--------+--------+------------------+------------------+           PSV cm/sEDV cm/sStenosisPlaque DescriptionComments           +----------+--------+--------+--------+------------------+------------------+ CCA Prox  61      8                                 intimal thickening +----------+--------+--------+--------+------------------+------------------+ CCA Distal43      9                                 intimal thickening +----------+--------+--------+--------+------------------+------------------+ ICA Prox  50      13                                                   +----------+--------+--------+--------+------------------+------------------+ ICA Distal84      27                                                   +----------+--------+--------+--------+------------------+------------------+ ECA       119     17                                                   +----------+--------+--------+--------+------------------+------------------+ +----------+--------+-------+--------+-------------------+           PSV cm/sEDV cmsDescribeArm Pressure (mmHG) +----------+--------+-------+--------+-------------------+ [id number]                                          +----------+--------+-------+--------+-------------------+ +---------+--------+--------+--------------+ VertebralPSV cm/sEDV cm/sNot identified +---------+--------+--------+--------------+  Left Carotid Findings: +----------+--------+--------+--------+------------------+------------------+           PSV cm/sEDV cm/sStenosisPlaque DescriptionComments           +----------+--------+--------+--------+------------------+------------------+ CCA Prox  54      8                                 intimal thickening +----------+--------+--------+--------+------------------+------------------+ CCA Distal50      11                                intimal thickening +----------+--------+--------+--------+------------------+------------------+ ICA Prox  32      9                                                    +----------+--------+--------+--------+------------------+------------------+  ICA Distal45      8                                                    +----------+--------+--------+--------+------------------+------------------+ ECA       74      9                                                    +----------+--------+--------+--------+------------------+------------------+ +----------+--------+--------+--------+-------------------+           PSV cm/sEDV cm/sDescribeArm Pressure (mmHG) +----------+--------+--------+--------+-------------------+ [id number]                                         +----------+--------+--------+--------+-------------------+ +---------+--------+--+--------+-+ VertebralPSV cm/s28EDV cm/s8 +---------+--------+--+--------+-+   Summary: Right Carotid: The extracranial vessels were near-normal with only minimal wall                thickening or plaque. Left Carotid: The extracranial vessels were near-normal with only minimal wall               thickening or plaque. Vertebrals:  Left vertebral artery demonstrates  antegrade flow. Bilateral              vertebral arteries were not visualized. Subclavians: Normal flow hemodynamics were seen in bilateral subclavian              arteries. *See table(s) above for measurements and observations.  Electronically signed by Antony Contras MD on 01/03/2020 at 1:02:01 PM.    Final    CT Maxillofacial Wo Contrast  Result Date: 01/01/2020 CLINICAL DATA:  Status post trauma. EXAM: CT HEAD WITHOUT CONTRAST CT MAXILLOFACIAL WITHOUT CONTRAST TECHNIQUE: Multidetector CT imaging of the head and maxillofacial structures were performed using the standard protocol without intravenous contrast. Multiplanar CT image reconstructions of the maxillofacial structures were also generated. COMPARISON:  MRI head July 12, 2013. FINDINGS: CT HEAD FINDINGS Brain: There is mild cerebral atrophy with widening of the extra-axial spaces and ventricular dilatation. There are areas of decreased attenuation within the white matter tracts of the supratentorial brain, consistent with microvascular disease changes. Vascular: No hyperdense vessel or unexpected calcification. Skull: Normal. Negative for fracture or focal lesion. Other: There is marked severity left maxillary sinus mucosal thickening. CT MAXILLOFACIAL FINDINGS Osseous: No fracture or mandibular dislocation. No destructive process. Orbits: Negative. No traumatic or inflammatory finding. Sinuses: There is marked severity left maxillary sinus mucosal thickening. Soft tissues: Negative. IMPRESSION: 1. No acute intracranial abnormality. 2. Mild cerebral atrophy and microvascular disease changes of the supratentorial brain. 3. Marked severity left maxillary sinus mucosal thickening. Electronically Signed   By: Virgina Norfolk M.D.   On: 01/01/2020 21:48    Labs:  Basic Metabolic Panel: Recent Labs  Lab 01/07/20 0550  NA 140  K 4.3  CL 108  CO2 21*  GLUCOSE 105*  BUN 15  CREATININE 1.18  CALCIUM 8.8*    CBC: Recent Labs  Lab  01/07/20 0550  WBC 9.5  NEUTROABS 5.0  HGB 12.3*  HCT 40.1  MCV 88.9  PLT 220    CBG: No results for input(s): GLUCAP in  the last 168 hours.  Family history. Mother with CAD and CVA. Father with CAD and CVA. Denies any diabetes mellitus colon cancer or esophageal cancer  Brief HPI:   Travis Salinas is a 69 y.o. right-handed male with history of chronic combined systolic diastolic congestive heart failure, chronic pain syndrome maintained on oxycodone, BPH, CKD stage II, COPD, hepatitis C, depression, polysubstance abuse and hypertension. Per chart review lives with roommate independent prior to admission he is separated from his wife. Plans to discharge home with his wife with assistance as needed. Presented 01/01/2020 with gait instability dizziness left-sided weakness as well as reported numerous falls without loss of conscious. Cranial CT scan negative for acute changes. Noted chronic small vessel changes of the white matter. Patient did not receive TPA. CT cervical spine negative. MRI/MRA showed several small acute infarcts involving multiple vascular territories bilaterally suggesting a central source. No proximal intracranial vessel occlusion or stenosis. Echocardiogram with ejection fraction of [id number] grade 1 diastolic dysfunction. Admission chemistries BUN 41 creatinine 1.44 urine drug screen positive opiates cocaine and benzos, troponin high-sensitivity 585, CK 106. Carotid Dopplers with no ICA stenosis. Cardiology service was consulted for complex tachycardia with underlying bundle branch block was placed on intravenous amiodarone transitioned to p.o. as well as started on Coreg. Attempts at cardioversion unsuccessful. His elevated troponin felt to be related to demand ischemia. Currently maintained on aspirin Plavix for CVA prophylaxis. He had been on Cleviprex for blood pressure control. Therapy evaluations completed and patient was admitted for a comprehensive rehab program   Hospital  Course: Travis Salinas was admitted to rehab 01/06/2020 for inpatient therapies to consist of PT, ST and OT at least three hours five days a week. Past admission physiatrist, therapy team and rehab RN have worked together to provide customized collaborative inpatient rehab. Pertaining to patient's cerebral embolic infarction remained stable he continue on aspirin and Plavix therapy would follow neurology services. SCDs for DVT prophylaxis. Chronic pain syndrome currently maintained on OxyContin 20 mg every 12 hours as well as low-dose Topamax. Mood stabilization with Lamictal 200 mg daily, as well as Prozac, Abilify 10 mg daily, Klonopin and amantadine 50 mg twice daily. Patient was participating with therapies. Blood pressures controlled with Coreg as well as amiodarone with noted wide-complex tachycardia he would follow cardiology services and recommendations were made to decrease amiodarone to 200 mg daily. He exhibited no signs of fluid overload. Noted history of polysubstance abuse urine drug screen positive cocaine neuropsychology follow-up as well as patient received counsel regards to cessation of illicit drug use as well as alcohol. BPH with Flomax ongoing voiding without difficulty.   Blood pressures were monitored on TID basis and controlled  He/ is continent of bowel and bladder.  He/ has made gains during rehab stay and is attending therapies  He/ will continue to receive follow up therapies   after discharge  Rehab course: During patient's stay in rehab weekly team conferences were held to monitor patient's progress, set goals and discuss barriers to discharge. At admission, patient required minimal assist ambulate 120 feet without assistive device minimal guard for general gait details minimal guard for stairs  Physical exam. Blood pressure 120/69 pulse 93 temperature 97.8 respiration 19 oxygen saturation 90% room air Constitutional. Alert well-developed peers older than stated  age 63 Head. Normocephalic and atraumatic Eyes. Pupils round and reactive to light no discharge without nystagmus Neck. Supple nontender no JVD without thyromegaly Cardiac regular rate rhythm without any extra sounds  or murmur heard Abdomen. Soft nontender positive bowel sounds without rebound Respiratory effort normal no respiratory distress without wheeze Musculoskeletal normal range of motion Comments. Biceps triceps wrist extension grip and finger abduction 5/5 bilateral hip flexion knee extension knee flexion dorsi and plantar flexion 5 out of 5 bilaterally Patient was alert a bit anxious follow commands provides breath place and year he did have some perseveration.  He/  has had improvement in activity tolerance, balance, postural control as well as ability to compensate for deficits. He/ has had improvement in functional use RUE/LUE  and RLE/LLE as well as improvement in awareness. Sessions focused on high-level balance and education with ex-wife. Performed bed mobility modified independent puts his shoes on with set up. Ambulates to the rehab gym distant supervision. Gather his belongings for activities day living and homemaking. It was discussed with his wife the need for supervision for safety.       Disposition: Discharged home    Diet: Regular  Special Instructions: No driving smoking alcohol or illicit drug use  Medications at discharge. 1. Tylenol as needed 2. Amantadine 50 mg twice daily 3. Abilify 10 mg daily 4. Amiodarone 200 mg daily 5. Aspirin 81 mg p.o. daily 6. Lipitor 40 mg daily 7. Coreg 3.125 mg p.o. twice daily 8. Klonopin 1 mg p.o. twice daily 9. Plavix and 75 mg p.o. daily 10. Prozac 10 mg p.o. daily 11. Lamictal 200 mg p.o. daily 12. Melatonin 1.5 mg p.o. nightly 13. OxyContin 20 mg p.o. every 12 hours 14. Protonix 40 mg p.o. nightly 15. MiraLAX twice daily hold for loose stools 16. Topamax 25 mg p.o. twice daily 17. Flomax 0.4 mg p.o.  daily  30-35 minutes were spent with discharge summary and discharge planning  Discharge Instructions    Ambulatory referral to Neurology   Complete by: As directed    An appointment is requested in approximately 4 weeks cerebral embolic infarction   Ambulatory referral to Physical Medicine Rehab   Complete by: As directed    Moderate complexity follow-up 1 to 2 weeks cerebral embolic infarction      Follow-up Information    Kirsteins, Luanna Salk, MD Follow up.   Specialty: Physical Medicine and Rehabilitation Why: Office to call for appointment Contact information: [place] [address] [phone]        Jerline Pain, MD Follow up.   Specialty: Cardiology Why: Call for appointment Contact information: [id number] [place] [address] [phone]           Signed: Lavon Paganini [place] 01/12/2020, 5:09 AM

## 2020-01-11 NOTE — Progress Notes (Signed)
Physical Therapy Session Note  Patient Details  Name: Travis Salinas MRN: [id number] Date of Birth: September 21, 1950  Today's Date: 01/11/2020 PT Individual Time: 3903-009 and 1415-1525 PT Individual Time Calculation (min):55 min and 70 min   Short Term Goals: Week 1:  PT Short Term Goal 1 (Week 1): = LTGs  Skilled Therapeutic Interventions/Progress Updates: Tx1: Pt presented in bed with MD present agreeable to therapy. Pt states continues to have HA however was recently premedicated. Performed bed mobility mod I and donned shoes mod I. Pt ambulated around room mod I obtaining hat and jacket and ambulated to ortho gym mod I. Pt performed car transfer mod I from compact SUV height. Pt then ambulated to ADL apt and performed bed mobility mod I. Pt ambulated to rehab gym and participated in stair training, obstacle course on compliant surface and dynamic balance on rebounder on level tile and [employer]. Pt performed all activities without LOB and was able to catch ball from ground if unable to catch. Pt ambulated to Day room and participated in NuStep L4-5 x 10 min for cardiovascular endurance and global strengthening. Pt returned to room at end of session and upgraded pt to Mod I in room. Pt left in room at end of session with needs met.   Tx2: Pt presented in bed sleeping but easily aroused and agreeable to therapy. Pt states continues to have significant HA (8/10), per pt nsg aware, pt was able to tolerate throughout session. Pt ambulated to rehab gym and participated in [place] with improved score of 53/56. Pt also participated in mini golf for balance and standing tolerance. Pt was able to place and reach for golf balls from ground mod I without LOB. Pt then ambulated to day room and participated in Wii bowling for dynamic balance and endurance. Pt did become increasingly fatigued towards end of game requiring more seated rests and becoming frustrated with controller however was able to  complete game. Pt ambulated back to room at end of session and left with current needs met.      Therapy Documentation Precautions:  Precautions Precautions: Fall Restrictions Weight Bearing Restrictions: No General:   Vital Signs: Therapy Vitals Temp: 97.7 F (36.5 C) Pulse Rate: 65 Resp: 14 BP: 125/64 Patient Position (if appropriate): Lying Oxygen Therapy SpO2: 97 % O2 Device: Room Air Pain:   Mobility:   Locomotion :    Trunk/Postural Assessment :    Balance: Balance Balance Assessed: Yes Standardized Balance Assessment Standardized Balance Assessment: Berg Balance Test Berg Balance Test Sit to Stand: Able to stand without using hands and stabilize independently Standing Unsupported: Able to stand safely 2 minutes Sitting with Back Unsupported but Feet Supported on Floor or Stool: Able to sit safely and securely 2 minutes Stand to Sit: Sits safely with minimal use of hands Transfers: Able to transfer safely, minor use of hands Standing Unsupported with Eyes Closed: Able to stand 10 seconds safely Standing Ubsupported with Feet Together: Able to place feet together independently and stand 1 minute safely From Standing, Reach Forward with Outstretched Arm: Can reach confidently >25 cm (10") From Standing Position, Pick up Object from Floor: Able to pick up shoe safely and easily From Standing Position, Turn to Look Behind Over each Shoulder: Looks behind from both sides and weight shifts well Turn 360 Degrees: Able to turn 360 degrees safely one side only in 4 seconds or less Standing Unsupported, Alternately Place Feet on Step/Stool: Able to stand independently and safely and  complete 8 steps in 20 seconds Standing Unsupported, One Foot in Front: Able to plae foot ahead of the other independently and hold 30 seconds Standing on One Leg: Able to lift leg independently and hold 5-10 seconds Total Score: 53 Exercises:   Other Treatments:      Therapy/Group:  Individual Therapy  Ciera Beckum 01/11/2020, 4:08 PM

## 2020-01-11 NOTE — Progress Notes (Signed)
Physical Therapy Discharge Summary  Patient Details  Name: Travis Salinas MRN: [id number] Date of Birth: 12-Mar-1951  Today's Date: 01/11/2020   Patient has met 7 of 7 long term goals due to improved activity tolerance, improved balance, improved postural control, improved attention, improved awareness and improved coordination. Pt has made good progress during current course of therapy. Pt has improved awareness for energy conservation and wife has been present for family education regarding observing pt with increased fatigue. Pt anticipates d/c to wife's 1 level home. Patient to discharge at an ambulatory level Independent.   Patient's care partner is independent to provide the necessary physical assistance at discharge.  Reasons goals not met: N/A all goals met  Recommendation:  Patient will benefit from ongoing skilled PT services in outpatient setting to continue to advance safe functional mobility, address ongoing impairments in balance, strength, coordination, safety, and minimize fall risk.  Equipment: No equipment provided  Reasons for discharge: treatment goals met  Patient/family agrees with progress made and goals achieved: Yes  PT Discharge Precautions/Restrictions Precautions Precautions: Fall Restrictions Weight Bearing Restrictions: No Pain Pain Assessment Pain Scale: 0-10 Pain Score: 0-No pain     Cognition Overall Cognitive Status: Within Functional Limits for tasks assessed Arousal/Alertness: Awake/alert Orientation Level: Oriented X4 Sensation Sensation Light Touch: Appears Intact Proprioception: Appears Intact Coordination Gross Motor Movements are Fluid and Coordinated: Yes Fine Motor Movements are Fluid and Coordinated: Yes Motor  Motor Motor: Within Functional Limits Motor - Discharge Observations: L hemiplegia improved  Mobility Bed Mobility Bed Mobility: Rolling Right;Rolling Left;Supine to Sit;Sit to Supine Rolling Right:  Independent Rolling Left: Independent Supine to Sit: Independent Sit to Supine: Independent Transfers Transfers: Sit to Stand;Stand to Sit Sit to Stand: Independent Stand to Sit: Independent Transfer (Assistive device): None Locomotion  Gait Ambulation: Yes Gait Assistance: Independent Gait Distance (Feet): 250 Feet Assistive device: None Gait Gait Pattern: Within Functional Limits Stairs / Additional Locomotion Stairs: Yes Stairs Assistance: Supervision/Verbal cueing Stair Management Technique: One rail Right;One rail Left;Two rails Number of Stairs: 16 Height of Stairs: 6 Ramp: Supervision/Verbal cueing Curb: Supervision/Verbal cueing Wheelchair Mobility Wheelchair Mobility: No  Trunk/Postural Assessment  Cervical Assessment Cervical Assessment: Within Functional Limits Thoracic Assessment Thoracic Assessment: Within Functional Limits Lumbar Assessment Lumbar Assessment: Within Functional Limits Postural Control Postural Control: Within Functional Limits  Balance Balance Balance Assessed: Yes Standardized Balance Assessment Standardized Balance Assessment: Berg Balance Test Berg Balance Test Sit to Stand: Able to stand without using hands and stabilize independently Standing Unsupported: Able to stand safely 2 minutes Sitting with Back Unsupported but Feet Supported on Floor or Stool: Able to sit safely and securely 2 minutes Stand to Sit: Sits safely with minimal use of hands Transfers: Able to transfer safely, minor use of hands Standing Unsupported with Eyes Closed: Able to stand 10 seconds safely Standing Ubsupported with Feet Together: Able to place feet together independently and stand 1 minute safely From Standing, Reach Forward with Outstretched Arm: Can reach confidently >25 cm (10") From Standing Position, Pick up Object from Floor: Able to pick up shoe safely and easily From Standing Position, Turn to Look Behind Over each Shoulder: Looks behind from  both sides and weight shifts well Turn 360 Degrees: Able to turn 360 degrees safely one side only in 4 seconds or less Standing Unsupported, Alternately Place Feet on Step/Stool: Able to stand independently and safely and complete 8 steps in 20 seconds Standing Unsupported, One Foot in Front: Able to plae foot ahead of the  other independently and hold 30 seconds Standing on One Leg: Able to lift leg independently and hold 5-10 seconds Total Score: 53 Static Sitting Balance Static Sitting - Level of Assistance: 7: Independent Dynamic Sitting Balance Dynamic Sitting - Balance Support: During functional activity Dynamic Sitting - Level of Assistance: 7: Independent Static Standing Balance Static Standing - Balance Support: During functional activity Static Standing - Level of Assistance: 7: Independent Dynamic Standing Balance Dynamic Standing - Balance Support: During functional activity Dynamic Standing - Level of Assistance: 7: Independent Dynamic Standing - Balance Activities: Compliant surfaces;[place];Reaching across midline Extremity Assessment      RLE Assessment RLE Assessment: Within Functional Limits LLE Assessment LLE Assessment: Within Functional Limits    Travis Salinas 01/11/2020, 10:20 AM

## 2020-01-11 NOTE — Progress Notes (Signed)
Occupational Therapy Discharge Summary  Patient Details  Name: Travis Salinas MRN: [id number] Date of Birth: 01/14/1951  Today's Date: 01/11/2020 OT Individual Time: 0160-1093 OT Individual Time Calculation (min): 56 min    Patient has met 9 of 9 long term goals due to improved activity tolerance, improved balance, postural control, ability to compensate for deficits, improved attention, improved awareness and improved coordination.  Patient to discharge at overall Modified Independent level. Reasons goals not met: all goals met  Recommendation:  Patient will benefit from ongoing skilled OT services in outpatient setting to continue to advance functional skills in the area of iADL.  Equipment: No equipment provided  Reasons for discharge: treatment goals met  Patient/family agrees with progress made and goals achieved: Yes   OT Intervention: Upon entering the room, pt supine in bed with no c/o pain this session. Pt ambulating in room without use of AD to obtain needed items from dresser and bathroom for shower independently. Pt bathing while standing and then seated on commode chair with sit <>stand for dressing without cuing needed for safety awareness this session. Pt picking up towels from floor, putting away dirty clothing, and cleaning area independently as well. Pt ambulating 500'+ outside for community mobility independently and taking seated rest break. Pt returning back to room in same manner with all needs within reach. Pt with no further questions at this time.   OT Discharge Precautions/Restrictions  Precautions Precautions: Fall Restrictions Weight Bearing Restrictions: No Pain Pain Assessment Pain Scale: 0-10 Pain Score: 8  Pain Location: Head Pain Descriptors / Indicators: Headache Pain Onset: On-going Patients Stated Pain Goal: 2 Pain Intervention(s): Emotional support Multiple Pain Sites: No ADL ADL Eating: Independent Grooming: Independent Upper Body  Bathing: Supervision/safety Where Assessed-Upper Body Bathing: Shower Lower Body Bathing: Supervision/safety Where Assessed-Lower Body Bathing: Shower Upper Body Dressing: Supervision/safety Lower Body Dressing: Supervision/safety Toileting: Supervision/safety Where Assessed-Toileting: Glass blower/designer: Distant supervision Armed forces technical officer Method: Magazine features editor: Distant supervision Social research officer, government Method: Ambulating Vision Baseline Vision/History: Wears glasses Wears Glasses: At all times Patient Visual Report: No change from baseline Cognition Overall Cognitive Status: Within Functional Limits for tasks assessed Arousal/Alertness: Awake/alert Orientation Level: Oriented X4 Sensation Sensation Light Touch: Appears Intact Hot/Cold: Appears Intact Proprioception: Appears Intact Stereognosis: Appears Intact Coordination Gross Motor Movements are Fluid and Coordinated: Yes Fine Motor Movements are Fluid and Coordinated: Yes Motor  Motor Motor: Within Functional Limits Motor - Discharge Observations: L hemiplegia improved Mobility  Bed Mobility Bed Mobility: Rolling Right;Rolling Left;Supine to Sit;Sit to Supine Rolling Right: Independent Rolling Left: Independent Supine to Sit: Independent Sit to Supine: Independent Transfers Sit to Stand: Independent Stand to Sit: Independent  Trunk/Postural Assessment  Cervical Assessment Cervical Assessment: Within Functional Limits Thoracic Assessment Thoracic Assessment: Within Functional Limits Lumbar Assessment Lumbar Assessment: Within Functional Limits Postural Control Postural Control: Within Functional Limits  Balance Balance Balance Assessed: Yes Static Sitting Balance Static Sitting - Level of Assistance: 7: Independent Dynamic Sitting Balance Dynamic Sitting - Balance Support: During functional activity Dynamic Sitting - Level of Assistance: 7: Independent Static Standing  Balance Static Standing - Balance Support: During functional activity Static Standing - Level of Assistance: 7: Independent Dynamic Standing Balance Dynamic Standing - Balance Support: During functional activity Dynamic Standing - Level of Assistance: 7: Independent Dynamic Standing - Balance Activities: Compliant surfaces;[place];Reaching across midline Extremity/Trunk Assessment RUE Assessment RUE Assessment: Within Functional Limits LUE Assessment LUE Assessment: Within Functional Limits   Gypsy Decant 01/11/2020, 11:18 AM

## 2020-01-11 NOTE — Plan of Care (Signed)
  Problem: Consults Goal: RH STROKE PATIENT EDUCATION Description: See Patient Education module for education specifics  Outcome: Progressing   Problem: RH SAFETY Goal: RH STG ADHERE TO SAFETY PRECAUTIONS W/ASSISTANCE/DEVICE Description: STG Adhere to Safety Precautions With mod I Assistance/Device. Outcome: Progressing   Problem: RH KNOWLEDGE DEFICIT Goal: RH STG INCREASE KNOWLEDGE OF HYPERTENSION Description: Patient/caregiver will verbalize understanding of HTN management including diet, exercise, medications, monitoring, and follow up care with mod I assist. Outcome: Progressing Goal: RH STG INCREASE KNOWLEGDE OF HYPERLIPIDEMIA Description: Patient/caregiver will verbalize understanding of HLD management including diet, exercise, medications, monitoring, and follow up care with mod I assist. Outcome: Progressing Goal: RH STG INCREASE KNOWLEDGE OF STROKE PROPHYLAXIS Description: Patient/caregiver will verbalize understanding of stroke prophylaxis management including diet, exercise, medications, monitoring, and follow up care with mod I assist. Outcome: Progressing

## 2020-01-11 NOTE — Progress Notes (Signed)
[facility] PHYSICAL MEDICINE & REHABILITATION PROGRESS NOTE   Subjective/Complaints:  Discussed stroke prevention and role of cocaine in stroke etiology.  Pt states that he will be staying with wife post D/C .  He states he is "through with cocaine"  ROS: Denies CP, SOB, N/V/D   Objective:   No results found. No results for input(s): WBC, HGB, HCT, PLT in the last 72 hours. No results for input(s): NA, K, CL, CO2, GLUCOSE, BUN, CREATININE, CALCIUM in the last 72 hours.  Intake/Output Summary (Last 24 hours) at 01/11/2020 0839 Last data filed at 01/10/2020 2026 Gross per 24 hour  Intake 540 ml  Output --  Net 540 ml     Physical Exam: Vital Signs Blood pressure 126/69, pulse 64, temperature 97.6 F (36.4 C), temperature source Oral, resp. rate 18, height 5\' 11"  (1.803 m), weight 62.8 kg, SpO2 97 %. Constitutional: No distress . Vital signs reviewed.  General: No acute distress Mood and affect are appropriate Heart: Regular rate and rhythm no rubs murmurs or extra sounds Lungs: Clear to auscultation, breathing unlabored, no rales or wheezes Abdomen: Positive bowel sounds, soft nontender to palpation, nondistended Extremities: No clubbing, cyanosis, or edema Skin: No evidence of breakdown, no evidence of rash Neurologic: Cranial nerves II through XII intact, motor strength is 5/5 in bilateral deltoid, bicep, tricep, grip, hip flexor, knee extensors, ankle dorsiflexor and plantar flexor Sensory exam normal sensation to light touch and proprioception in bilateral upper and lower extremities Cerebellar exam normal finger to nose to finger as well as heel to shin in bilateral upper and lower extremities Musculoskeletal: Full range of motion in all 4 extremities. No joint swelling   Assessment/Plan: 1. Functional deficits secondary to bicerebral cardioembolic infarcts which require 3+ hours per day of interdisciplinary therapy in a comprehensive inpatient rehab  setting.  Physiatrist is providing close team supervision and 24 hour management of active medical problems listed below.  Physiatrist and rehab team continue to assess barriers to discharge/monitor patient progress toward functional and medical goals  Care Tool:  Bathing    Body parts bathed by patient: Right arm, Face, Left arm, Chest, Abdomen, Front perineal area, Buttocks, Right upper leg, Left upper leg, Right lower leg, Left lower leg         Bathing assist Assist Level: Set up assist     Upper Body Dressing/Undressing Upper body dressing   What is the patient wearing?: Pull over shirt    Upper body assist Assist Level: Independent    Lower Body Dressing/Undressing Lower body dressing      What is the patient wearing?: Pants, Underwear/pull up     Lower body assist Assist for lower body dressing: Supervision/Verbal cueing     Toileting Toileting    Toileting assist Assist for toileting: Independent     Transfers Chair/bed transfer  Transfers assist     Chair/bed transfer assist level: Supervision/Verbal cueing     Locomotion Ambulation   Ambulation assist      Assist level: Contact Guard/Touching assist Assistive device: No Device Max distance: 200'   Walk 10 feet activity   Assist     Assist level: Supervision/Verbal cueing Assistive device: No Device   Walk 50 feet activity   Assist    Assist level: Contact Guard/Touching assist Assistive device: No Device    Walk 150 feet activity   Assist    Assist level: Contact Guard/Touching assist Assistive device: No Device    Walk 10 feet on uneven surface  activity   Assist     Assist level: Contact Guard/Touching assist     Wheelchair     Assist Will patient use wheelchair at discharge?: No             Wheelchair 50 feet with 2 turns activity    Assist            Wheelchair 150 feet activity     Assist          Blood pressure 126/69,  pulse 64, temperature 97.6 F (36.4 C), temperature source Oral, resp. rate 18, height 5\' 11"  (1.803 m), weight 62.8 kg, SpO2 97 %.  Medical Problem List and Plan: 1.  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  Antithrombotics: -DVT/anticoagulation: SCDs             -antiplatelet therapy: Aspirin 81 mg daily and Plavix 75 mg daily 3. Pain Management/chronic pain:   ?Oxycodone 20 mg every 12 hours as needed will try to keep on lower than home dose due to cognitive effects and fall risk   Will need follow-up in clinic by medications PTA and wean during hospital stay  Topamax added on 4/30, with improvement- will increase to 25mg  BID 4. Mood: Lamictal 200 mg daily, amantadine 50 mg twice daily, Prozac 10 mg daily, Klonopin 1 mg twice daily             -antipsychotic agents: Abilify 10 mg daily 5. Neuropsych: This patient is capable of making decisions on his own behalf. 6. Skin/Wound Care: Routine skin checks 7. Fluids/Electrolytes/Nutrition: Routine in and outs 8.  Wide-complex tachycardia.  Continue amiodarone 200 mg daily.  Follow-up cardiology services 9.  Acute on chronic combined systolic diastolic congestive heart failure.  Monitor for any signs of fluid overload Filed Weights   01/09/20 0514 01/09/20 1946 01/11/20 0500  Weight: 63.5 kg 63 kg 62.8 kg   Stable on 5/3 10.  Hypertension.  Coreg 3.125 mg twice daily.  Monitor with increased mobility Vitals:   01/10/20 1924 01/11/20 0610  BP: 131/67 126/69  Pulse: 64 64  Resp: 18 18  Temp: 97.9 F (36.6 C) 97.6 F (36.4 C)  SpO2: 98% 97%    Controlled on 5/4 11.  COPD.  Continue nebulizer as needed.  Monitor oxygen saturations. 12.  Polysubstance abuse.  Urine drug screen positive cocaine.  Provide counseling Neuropsych eval  13.  BPH.  Flomax 0.4 mg daily.    PVRs unremarkable 14.  Hyperlipidemia.     Lipitor 15.  Acute blood loss anemia  Hemoglobin 12.3 on 4/30 16.  Hypoalbuminemia  Supplement initiated on 5/1  17.  Sleep disturbance  Melatonin ordered, with improvement  LOS: 5 days A FACE TO Travis Salinas 01/11/2020, 8:39 AM

## 2020-01-11 NOTE — Progress Notes (Signed)
Social Work Patient ID: Travis Salinas, male   DOB: 1951-05-20, 69 y.o.   MRN: [id number] Referral made to Neuro OP for follow up

## 2020-01-12 DIAGNOSIS — I6300 Cerebral infarction due to thrombosis of unspecified precerebral artery: Secondary | ICD-10-CM

## 2020-01-12 MED ORDER — OXYCODONE HCL ER 20 MG PO T12A: 20.0000 mg | EXTENDED_RELEASE_TABLET | Freq: Two times a day (BID) | ORAL | 0 refills | Status: DC

## 2020-01-12 NOTE — Progress Notes (Signed)
[place] PHYSICAL MEDICINE & REHABILITATION PROGRESS NOTE   Subjective/Complaints:  Did ok, mod I in room Discussed cocaine abuse Was not driving PTA  ROS: Denies CP, SOB, N/V/D   Objective:   No results found. No results for input(s): WBC, HGB, HCT, PLT in the last 72 hours. No results for input(s): NA, K, CL, CO2, GLUCOSE, BUN, CREATININE, CALCIUM in the last 72 hours.  Intake/Output Summary (Last 24 hours) at 01/12/2020 1002 Last data filed at 01/12/2020 0810 Gross per 24 hour  Intake 857 ml  Output --  Net 857 ml     Physical Exam: Vital Signs Blood pressure 116/62, pulse 65, temperature 97.7 F (36.5 C), temperature source Oral, resp. rate 16, height 5\' 11"  (1.803 m), weight 62.4 kg, SpO2 96 %. Constitutional: No distress . Vital signs reviewed.  General: No acute distress Mood and affect are appropriate Heart: Regular rate and rhythm no rubs murmurs or extra sounds Lungs: Clear to auscultation, breathing unlabored, no rales or wheezes Abdomen: Positive bowel sounds, soft nontender to palpation, nondistended Extremities: No clubbing, cyanosis, or edema Skin: No evidence of breakdown, no evidence of rash Neurologic: Cranial nerves II through XII intact, motor strength is 5/5 in bilateral deltoid, bicep, tricep, grip, hip flexor, knee extensors, ankle dorsiflexor and plantar flexor Sensory exam normal sensation to light touch and proprioception in bilateral upper and lower extremities Cerebellar exam normal finger to nose to finger as well as heel to shin in bilateral upper and lower extremities Musculoskeletal: Full range of motion in all 4 extremities. No joint swelling   Assessment/Plan: 1. Functional deficits secondary to bicerebral cardioembolic infarcts Stable for D/C today F/u PCP in 3-4 weeks F/u PM&R 2 weeks See D/C summary See D/C instructions Care Tool:  Bathing    Body parts bathed by patient: Right arm, Face, Left arm, Chest, Abdomen, Front  perineal area, Buttocks, Right upper leg, Left upper leg, Right lower leg, Left lower leg         Bathing assist Assist Level: Independent     Upper Body Dressing/Undressing Upper body dressing   What is the patient wearing?: Pull over shirt    Upper body assist Assist Level: Independent    Lower Body Dressing/Undressing Lower body dressing      What is the patient wearing?: Pants, Underwear/pull up     Lower body assist Assist for lower body dressing: Independent     Toileting Toileting    Toileting assist Assist for toileting: Independent     Transfers Chair/bed transfer  Transfers assist     Chair/bed transfer assist level: Independent     Locomotion Ambulation   Ambulation assist      Assist level: Independent Assistive device: No Device Max distance: 264ft   Walk 10 feet activity   Assist     Assist level: Independent Assistive device: No Device   Walk 50 feet activity   Assist    Assist level: Independent Assistive device: No Device    Walk 150 feet activity   Assist    Assist level: Independent Assistive device: No Device    Walk 10 feet on uneven surface  activity   Assist     Assist level: Independent Assistive device: Other (comment)(no AD)   Wheelchair     Assist Will patient use wheelchair at discharge?: No             Wheelchair 50 feet with 2 turns activity    Assist  Wheelchair 150 feet activity     Assist          Blood pressure 116/62, pulse 65, temperature 97.7 F (36.5 C), temperature source Oral, resp. rate 16, height 5\' 11"  (1.803 m), weight 62.4 kg, SpO2 96 %.  Medical Problem List and Plan: 1.  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  Antithrombotics: -DVT/anticoagulation: SCDs             -antiplatelet therapy: Aspirin 81 mg daily and  Plavix 75 mg daily 3. Pain Management/chronic pain:   ?Oxycodone 20 mg every 12 hours as needed will try to keep on lower than home dose due to cognitive effects and fall risk   Will need follow-up in clinic by medications PTA and wean during hospital stay  Topamax added on 4/30, with improvement- will increase to 25mg  BID 4. Mood: Lamictal 200 mg daily, amantadine 50 mg twice daily, Prozac 10 mg daily, Klonopin 1 mg twice daily             -antipsychotic agents: Abilify 10 mg daily 5. Neuropsych: This patient is capable of making decisions on his own behalf. 6. Skin/Wound Care: Routine skin checks 7. Fluids/Electrolytes/Nutrition: Routine in and outs 8.  Wide-complex tachycardia.  Continue amiodarone 200 mg daily.  Follow-up cardiology services 9.  Acute on chronic combined systolic diastolic congestive heart failure.  Monitor for any signs of fluid overload Filed Weights   01/09/20 1946 01/11/20 0500 01/12/20 0545  Weight: 63 kg 62.8 kg 62.4 kg   Stable on 5/3 10.  Hypertension.  Coreg 3.125 mg twice daily.  Monitor with increased mobility Vitals:   01/11/20 2013 01/12/20 0528  BP: (!) 143/56 116/62  Pulse: 64 65  Resp: 18 16  Temp: 97.6 F (36.4 C) 97.7 F (36.5 C)  SpO2: 99% 96%    Controlled on 5/5 11.  COPD.  Continue nebulizer as needed.  Monitor oxygen saturations. 12.  Polysubstance abuse.  Urine drug screen positive cocaine.  Provide counseling Neuropsych eval  13.  BPH.  Flomax 0.4 mg daily.    PVRs unremarkable 14.  Hyperlipidemia.    Lipitor 15.  Acute blood loss anemia  Hemoglobin 12.3 on 4/30 16.  Hypoalbuminemia  Supplement initiated on 5/1  17.  Sleep disturbance  Melatonin ordered, with improvement  LOS: 6 days A FACE TO FACE EVALUATION WAS PERFORMED  Travis Salinas 01/12/2020, 10:02 AM

## 2020-01-12 NOTE — Patient Care Conference (Signed)
Inpatient RehabilitationTeam Conference and Plan of Care Update Date: 01/12/2020   Time: 10:00 AM   Patient Name: Travis Salinas      Medical Record Number: [id number]  Date of Birth: [birth year] Sex: Male         Room/Bed: 4W09C/4W09C-01 Payor Info: Payor: [employer] / Plan: [employer] PPO / Product Type: *No Product type* /    Admit Date/Time:  01/06/2020  5:22 PM  Primary Diagnosis:  Stroke (cerebrum) [facility])  Patient Active Problem List   Diagnosis Date Noted  . Chronic pain syndrome   . Hypoalbuminemia due to protein-calorie malnutrition ([place])   . Acute blood loss anemia   . Benign prostatic hyperplasia   . Vascular headache   . Acute on chronic combined systolic and diastolic heart failure ([place])   . Sleep disturbance   . Embolic cerebral infarction ([place]) 01/06/2020  . Acute renal failure ([place])   . Left leg weakness   . Ventricular tachyarrhythmia ([place])   . Stroke (cerebrum) ([place]) 01/01/2020  . Normocytic anemia 06/04/2017  . COPD (chronic obstructive pulmonary disease) ([place]) 06/04/2017  . Pneumococcal bacteremia 05/28/2017  . Pneumococcal pneumonia ([place]) 05/27/2017  . IVDU (intravenous drug user)   . Liver fibrosis 04/24/2016  . Chronic hepatitis C without hepatic coma ([place]) 04/19/2015  . Substance abuse ([place]) 04/19/2015  . HTN (hypertension) 11/12/2013  . Depression 11/12/2013    Expected Discharge Date: Expected Discharge Date: 01/12/20  Team Members Present: Physician leading conference: Dr. Alysia Penna Care Coodinator Present: Erlene Quan, BSW;Deborah Tobin Chad, RN, BSN, CRRN;Genie Regina Ganci, RN, MSN Nurse Present: Other (comment)(Amanda Archie, RN) PT Present: Phylliss Bob, PTA;Barrie Folk, PT OT Present: Darleen Crocker, OT SLP Present: Jettie Booze, CF-SLP PPS Coordinator present : Gunnar Fusi, Novella Olive, PT     Current Status/Progress Goal Weekly Team Focus  Bowel/Bladder   Continent of bowel/bladder.  To remain  continent.  Check on pt bathroom needs every 2 hours if needed since he is independent.   Swallow/Nutrition/ Hydration             ADL's   Setup bathing at shower level, Independent UB dressing, Supervision LB dressing, Mod I toilet transfers + toileting, Supervision shower transfer  Mod I overall  D/c planning, NMR, pt/family education   Mobility   mod I bed mobilty, independent transfers, mod I gait shorter distances, distant supervision longer distances  mod I  higher level balance d/c planning   Communication             Safety/Cognition/ Behavioral Observations            Pain   Pain is 7/10 in mid neck area. Gets oxy every 12 hours and can get tylenol prn.  To bring pain level lower than 2/10.  Assess pain q shift or prn.   Skin   Ecchymosis on right, left arm due to lab draws. Skin is intact.  Prevent skin breakdown from occuring.  Assess skin q shift or prn.    Rehab Goals Patient on target to meet rehab goals: Yes Rehab Goals Revised: Patient on target with goals *See Care Plan and progress notes for long and short-term goals.     Barriers to Discharge  Current Status/Progress Possible Resolutions Date Resolved   Nursing                  PT                    OT  SLP                SW Lack of/limited family support Patient lives in 3 story home, discharging home with spouse also retired Therapist, sports to provide care On target for discharge          Discharge Planning/Teaching Needs:  Patient plans to discharge home with spouse (seperated), due to him living in 3 story home  Will schedule family education if needed   Team Discussion: MD + cocaine use, family h/o CVA, may stay with estranged wife for a while, MD says no driving.  RN wife was here yesterday.  PT I with transfers and gait, stairs distant S.  OT I with everything.  SLP none.  Outpatient therapy for followup May 11.   Revisions to Treatment Plan: DC date today 01/12/20     Medical  Summary Current Status: Good progress with therapy, mod I level in room, no safety awareness deficits at present Weekly Focus/Goal: Caregiver training discharge instructions  Barriers to Discharge: Other (comments)  Barriers to Discharge Comments: History of cocaine abuse have counseled Possible Resolutions to Barriers: Stable for discharge today, will be discharged to his wife's home   Continued Need for Acute Rehabilitation Level of Care: The patient requires daily medical management by a physician with specialized training in physical medicine and rehabilitation for the following reasons: Direction of a multidisciplinary physical rehabilitation program to maximize functional independence : Yes Medical management of patient stability for increased activity during participation in an intensive rehabilitation regime.: Yes Analysis of laboratory values and/or radiology reports with any subsequent need for medication adjustment and/or medical intervention. : Yes   I attest that I was present, lead the team conference, and concur with the assessment and plan of the team.   Jodell Cipro M 01/12/2020, 2:06 PM   Team conference was held via web/ teleconference due to [place] - 19

## 2020-01-12 NOTE — Progress Notes (Signed)
Patient discharged home with all personal belongings. Patient going home in wife's personal vehicle. Patient was wheeled out by staff in wheelchair about 1510. Patient was very excited to be going home.

## 2020-01-12 NOTE — Progress Notes (Signed)
Social Work Discharge Note   The overall goal for the admission was met for:   Discharge location: Yes, Home  Length of Stay: Yes, 6 Days  Discharge activity level: Yes, Independent  Home/community participation: Yes  Services provided included: MD, RD, PT, OT, SLP, RN, CM, TR, Pharmacy and SW  Financial Services: Private Insurance: [employer]  Follow-up services arranged: Outpatient: [facility]  Comments (or additional information): PT OT follow up   Patient/Family verbalized understanding of follow-up arrangements: Yes  Individual responsible for coordination of the follow-up plan: self/spouse [phone] or [phone]  Confirmed correct DME delivered: Dyanne Iha 01/12/2020    Dyanne Iha

## 2020-01-12 NOTE — Discharge Instructions (Signed)
Inpatient Rehab Discharge Instructions  [place] Discharge date and time: No discharge date for patient encounter.   Activities/Precautions/ Functional Status: Activity: activity as tolerated Diet: regular diet Wound Care: none needed Functional status:  ___ No restrictions     ___ Walk up steps independently ___ 24/7 supervision/assistance   ___ Walk up steps with assistance ___ Intermittent supervision/assistance  ___ Bathe/dress independently ___ Walk with walker     _x__ Bathe/dress with assistance ___ Walk Independently    ___ Shower independently ___ Walk with assistance    ___ Shower with assistance ___ No alcohol     ___ Return to work/school ________  COMMUNITY REFERRALS UPON DISCHARGE:    Outpatient: PT     OT                 Agency: [facility] Phone: [phone]              Appointment/Time: May 11th at 11:45 AM  Special Instructions: No driving smoking or alcohol or illicit drug use STROKE/TIA DISCHARGE INSTRUCTIONS SMOKING Cigarette smoking nearly doubles your risk of having a stroke & is the single most alterable risk factor  If you smoke or have smoked in the last 12 months, you are advised to quit smoking for your health.  Most of the excess cardiovascular risk related to smoking disappears within a year of stopping.  Ask you doctor about anti-smoking medications  [place] Quit Line: 1-800-QUIT NOW  Free Smoking Cessation Classes [phone]  CHOLESTEROL Know your levels; limit fat & cholesterol in your diet  Lipid Panel     Component Value Date/Time   CHOL 151 01/02/2020 0329   TRIG 97 01/02/2020 0329   HDL 45 01/02/2020 0329   CHOLHDL 3.4 01/02/2020 0329   VLDL 19 01/02/2020 0329   LDLCALC 87 01/02/2020 0329      Many patients benefit from treatment even if their cholesterol is at goal.  Goal: Total Cholesterol (CHOL) less than 160  Goal:  Triglycerides (TRIG) less than 150  Goal:  HDL greater than 40  Goal:  LDL  (LDLCALC) less than 100   BLOOD PRESSURE American Stroke Association blood pressure target is less that 120/80 mm/Hg  Your discharge blood pressure is:  BP: (!) 107/51  Monitor your blood pressure  Limit your salt and alcohol intake  Many individuals will require more than one medication for high blood pressure  DIABETES (A1c is a blood sugar average for last 3 months) Goal HGBA1c is under 7% (HBGA1c is blood sugar average for last 3 months)  Diabetes: No known diagnosis of diabetes    Lab Results  Component Value Date   HGBA1C 5.2 01/02/2020     Your HGBA1c can be lowered with medications, healthy diet, and exercise.  Check your blood sugar as directed by your physician  Call your physician if you experience unexplained or low blood sugars.  PHYSICAL ACTIVITY/REHABILITATION Goal is 30 minutes at least 4 days per week  Activity: Increase activity slowly, Therapies: Physical Therapy: Home Health Return to work:   Activity decreases your risk of heart attack and stroke and makes your heart stronger.  It helps control your weight and blood pressure; helps you relax and can improve your mood.  Participate in a regular exercise program.  Talk with your doctor about the best form of exercise for you (dancing, walking, swimming, cycling).  DIET/WEIGHT Goal is to maintain a healthy weight  Your discharge diet is:  Diet Order  Diet heart healthy/carb modified Room service appropriate? Yes; Fluid consistency: Thin  Diet effective now              liquids Your height is:  Height: 5\' 11"  (180.3 cm) Your current weight is: Weight: 63.7 kg Your Body Mass Index (BMI) is:  BMI (Calculated): 19.6  Following the type of diet specifically designed for you will help prevent another stroke.  Your goal weight range is:    Your goal Body Mass Index (BMI) is 19-24.  Healthy food habits can help reduce 3 risk factors for stroke:  High cholesterol, hypertension, and excess weight.    RESOURCES Stroke/Support Group:  Call [phone]   STROKE EDUCATION PROVIDED/REVIEWED AND GIVEN TO PATIENT Stroke warning signs and symptoms How to activate emergency medical system (call 911). Medications prescribed at discharge. Need for follow-up after discharge. Personal risk factors for stroke. Pneumonia vaccine given:  Flu vaccine given:  My questions have been answered, the writing is legible, and I understand these instructions.  I will adhere to these goals & educational materials that have been provided to me after my discharge from the hospital.      My questions have been answered and I understand these instructions. I will adhere to these goals and the provided educational materials after my discharge from the hospital.  Patient/Caregiver Signature _______________________________ Date __________  Clinician Signature _______________________________________ Date __________  Please bring this form and your medication list with you to all your follow-up doctor's appointments.

## 2020-01-14 ENCOUNTER — Telehealth: Payer: Self-pay | Admitting: *Deleted

## 2020-01-14 NOTE — Telephone Encounter (Signed)
Transitional Care call-I spoke with both Mr and Mrs Teyton Norbut    1. Are you/is patient experiencing any problems since coming home? Are there any questions regarding any aspect of care? NO 2. Are there any questions regarding medications administration/dosing? Are meds being taken as prescribed? Patient should review meds with caller to confirm YES,  3. Have there been any falls? NO 4. Has Home Health been to the house and/or have they contacted you? If not, have you tried to contact them? Can we help you contact them? NO HH, He will go out for outpt PT/OT 5. Are bowels and bladder emptying properly? Are there any unexpected incontinence issues? If applicable, is patient following bowel/bladder programs? NO 6. Any fevers, problems with breathing, unexpected pain? NO 7. Are there any skin problems or new areas of breakdown? NO 8. Has the patient/family member arranged specialty MD follow up (ie cardiology/neurology/renal/surgical/etc)?  Can we help arrange? NO appt given for DR Kirsteins 9. Does the patient need any other services or support that we can help arrange? NO 10. Are caregivers following through as expected in assisting the patient? YES 11. Has the patient quit smoking, drinking alcohol, or using drugs as recommended? YES  Appointment with Dr Letta Pate Thursday 02/03/20. Arrive by 9:45 for a 10:15 appointment.  Office address reviewed, and told to watch for packet in the mail. He requested it be mailed to his wife's address  [place], [place] [address].  [address]

## 2020-01-18 ENCOUNTER — Ambulatory Visit: Payer: PPO | Attending: Physician Assistant

## 2020-01-18 ENCOUNTER — Other Ambulatory Visit: Payer: Self-pay

## 2020-01-18 ENCOUNTER — Telehealth: Payer: Self-pay | Admitting: Primary Care

## 2020-01-18 DIAGNOSIS — I6310 Cerebral infarction due to embolism of unspecified precerebral artery: Secondary | ICD-10-CM | POA: Diagnosis not present

## 2020-01-18 DIAGNOSIS — R2681 Unsteadiness on feet: Secondary | ICD-10-CM | POA: Insufficient documentation

## 2020-01-18 MED ORDER — ANORO ELLIPTA 62.5-25 MCG/INH IN AEPB: 1.0000 | INHALATION_SPRAY | Freq: Every day | RESPIRATORY_TRACT | 3 refills | Status: DC

## 2020-01-18 NOTE — Telephone Encounter (Signed)
ATC pt, no answer. Left message for pt to call back.  Rx sent into pharmacy.

## 2020-01-18 NOTE — Addendum Note (Signed)
Addended by: Kerrie Pleasure on: 01/18/2020 01:12 PM   Modules accepted: Orders

## 2020-01-18 NOTE — Therapy (Signed)
[facility] [address] [place], [place], [address] Phone: [phone]   Fax:  [phone]  Physical Therapy Evaluation  Patient Details  Name: Travis Salinas MRN: [id number] Date of Birth: [birth year] Referring Provider (PT): [place]   Encounter Date: 01/18/2020  PT End of Session - 01/18/20 1302    Visit Number  1    Number of Visits  2    Date for PT Re-Evaluation  01/25/20    PT Start Time  [id number]    PT Stop Time  1230    PT Time Calculation (min)  45 min    Equipment Utilized During Treatment  Gait belt    Activity Tolerance  Patient tolerated treatment well    Behavior During Therapy  [facility] for tasks assessed/performed       Past Medical History:  Diagnosis Date  . Arthritis   . Cervical disc herniation   . COPD (chronic obstructive pulmonary disease) ([place])   . Headache    " due to antibiotics"  . Hepatitis C   . Hypertension     Past Surgical History:  Procedure Laterality Date  . APPLICATION OF A-CELL OF EXTREMITY Left 12/05/2016   Procedure: APPLICATION OF A-CELL OF EXTREMITY;  Surgeon: Loel Lofty Dillingham, DO;  Location: [facility];  Service: Plastics;  Laterality: Left;  . APPLICATION OF WOUND VAC Left 12/05/2016   Procedure: APPLICATION OF WOUND VAC;  Surgeon: Loel Lofty Dillingham, DO;  Location: [facility];  Service: Plastics;  Laterality: Left;  . I & D EXTREMITY Left 11/03/2016   Procedure: IRRIGATION AND DEBRIDEMENT EXTREMITY;  Surgeon: Leandrew Koyanagi, MD;  Location: [facility];  Service: Orthopedics;  Laterality: Left;  . I & D EXTREMITY Left 12/05/2016   Procedure: IRRIGATION AND DEBRIDEMENT EXTREMITY;  Surgeon: Loel Lofty Dillingham, DO;  Location: [facility];  Service: Plastics;  Laterality: Left;  . INCISION AND DRAINAGE ABSCESS Right 11/12/2013   Procedure: INCISION AND DRAINAGE AND OPEN PACKING OF RIGHT CALF  ABSCESS;  Surgeon: Earnstine Regal, MD;  [facility];  Service: General;  Laterality: Right;  .  INCISION AND DRAINAGE OF WOUND Left 01/29/2017   Procedure: IRRIGATION AND DEBRIDEMENT OF LEFT LEG WOUND WITH ABRA PLACEMENT AND PLACEMENT OF WOUND VAC;  Surgeon: Wallace Going, DO;  [facility];  Service: Plastics;  Laterality: Left;  . KNEE SURGERY      There were no vitals filed for this visit.   Subjective Assessment - 01/18/20 1251    Subjective  Pt reports he was talking to his friend on 01/01/20 and his left leg started giving out and fell. He was taken to ED and stroke protocol was initiated. He came back home on 01/12/20. He is living with friend right now. he is ambulating without AD right now. He typically lives with his room mate in a condo.    Patient is accompained by:  --   Travis Salinas   How long can you sit comfortably?  no limits    How long can you stand comfortably?  30 min    How long can you walk comfortably?  30 min    Diagnostic tests  IMPRESSION:Several small acute infarcts involving multiple vascular territoriesbilaterally suggesting a central source. No proximal intracranialvessel occlusion or significant stenosis. Chronic microvascular ischemic changes.    Patient Stated Goals  get better    Currently in Pain?  No/denies         [facility] PT Assessment -  01/18/20 1211      Assessment   Medical Diagnosis  CVA    Referring Provider (PT)  [place]    Onset Date/Surgical Date  01/01/20    Hand Dominance  Right    Prior Therapy  inpatient      Precautions   Precautions  Fall      Restrictions   Weight Bearing Restrictions  No      Balance Screen   Has the patient fallen in the past 6 months  Yes    How many times?  4-5    Has the patient had a decrease in activity level because of a fear of falling?   No      Home Film/video editor residence    Living Arrangements  Other (Comment)   room mate   Available Help at Discharge  Friend(s)    Type of [place] to enter    Entrance Stairs-Number of  Steps  32    Entrance Stairs-Rails  Left;Right    Home Layout  One level    [place]  None      Prior Function   Level of Independence  Independent;Other (comment)   pt doesn't drive (4-5 yrs), needs help with shopping   Vocation  Retired      Associate Professor   Overall Cognitive Status  Within Functional Limits for tasks assessed    Attention  Focused    Focused Attention  Appears intact    Memory  Appears intact    Awareness  Appears intact      ROM / Strength   AROM / PROM / Strength  AROM;Strength      AROM   Overall AROM   Within functional limits for tasks performed    AROM Assessment Site  Ankle    Right/Left Ankle  Left;Right    Right Ankle Dorsiflexion  15    Left Ankle Dorsiflexion  15      Strength   Overall Strength  Within functional limits for tasks performed    Overall Strength Comments  5/5 bil hip flex/abd/add; knee flex/ext, ankle PF/DF      Ambulation/Gait   Ambulation/Gait  Yes    Ambulation/Gait Assistance  7: Independent    Ambulation Distance (Feet)  115 Feet    Assistive device  None    Gait Pattern  Within Functional Limits    Ambulation Surface  Level      Standardized Balance Assessment   Standardized Balance Assessment  Berg Balance Test;Dynamic Gait Index;Five Times Sit to Stand;10 meter walk test    Five times sit to stand comments   8 sec   Norm = 12 s   10 Meter Walk  1.30 m/s   >0.54m/s =  community ambulator     Furniture conservator/restorer   Sit to Stand  Able to stand without using hands and stabilize independently    Standing Unsupported  Able to stand safely 2 minutes    Sitting with Back Unsupported but Feet Supported on Floor or Stool  Able to sit safely and securely 2 minutes    Stand to Sit  Sits safely with minimal use of hands    Transfers  Able to transfer safely, minor use of hands    Standing Unsupported with Eyes Closed  Able to stand 10 seconds safely    Standing Unsupported with Feet Together  Able to place feet together  independently and stand 1 minute safely    From Standing, Reach Forward with Outstretched Arm  Can reach confidently >25 cm (10")    From Standing Position, Pick up Object from [place] to pick up shoe safely and easily    From Standing Position, Turn to Look Behind Over each Shoulder  Looks behind from both sides and weight shifts well    Turn 360 Degrees  Able to turn 360 degrees safely in 4 seconds or less    Standing Unsupported, Alternately Place Feet on Step/Stool  Able to stand independently and safely and complete 8 steps in 20 seconds    Standing Unsupported, One Foot in [place] to take small step independently and hold 30 seconds    Standing on One Leg  Able to lift leg independently and hold equal to or more than 3 seconds    Total Score  52      Dynamic Gait Index   Level Surface  Normal    Change in Gait Speed  Normal    Gait with Horizontal Head Turns  Normal    Gait with Vertical Head Turns  Normal    Gait and Pivot Turn  Normal    Step Over Obstacle  Normal    Step Around Obstacles  Normal    Steps  Normal    Total Score  24                Objective measurements completed on examination: See above findings.              PT Education - 01/18/20 1256    Education Details  Pt educated on looking down when stepping over the obstacles, thresholds, steps to improve visual acuity and balance and to reduce the fall risk. Patient educated on removing all the throw rugs from his condo to prevent any trips or falls.    Person(s) Educated  Patient    Methods  Explanation;Demonstration    Comprehension  Verbalized understanding;Returned demonstration          PT Long Term Goals - 01/18/20 1257      PT LONG TERM GOAL #1   Title  Pt will demonstrate improved visual acuity when stepping over, around obstacles to improve safety awareness.    Baseline  Pt looks straight ahead, increased risk for tripping.    Time  1    Period  Weeks    Status  New     Target Date  01/25/20             Plan - 01/18/20 1258    Clinical Impression Statement  Pt is a 69 y.o. male who was seen today for physical therapy evaluation and treatment for decreased gait and mobility after recent stroke. Patient currently demonstrates very low risk for fall. Patient demonstrated 24/24 on dynamic gait index and scored 52/56 on Berg Balance scale. Patient demonstrates 5/5 strength in bil LE. Patient has reported multiple falls in past but majority of the falls were when patient was going up the stairs without holding on to rail and without looking down. He reported hitting the edge of the step and loosing balance. Patient was educated on improving visual acuity by looking down when stepping up, over or around obstacles to improve safety awareness and reduce fall risk. We will see patient one more time in one week to go over safety precautions again to measure compliaince and reduce fall risk.    Personal Factors  and Comorbidities  Time since onset of injury/illness/exacerbation    Examination-Activity Limitations  Stairs    Stability/Clinical Decision Making  Stable/Uncomplicated    Clinical Decision Making  Low    Rehab Potential  Excellent    PT Frequency  1x / week    PT Duration  --   1 week   PT Treatment/Interventions  Patient/family education;Gait training;Stair training;Functional mobility training;Therapeutic activities;Therapeutic exercise;Balance training;Neuromuscular re-education    PT Next Visit Plan  Go over improving visual acuity when stepping on, over around obstalces    PT Home Exercise Plan  Walking program for 30 min /day    Consulted and Agree with Plan of Care  Patient;Family member/caregiver    Family Member Consulted  Travis Salinas       Patient will benefit from skilled therapeutic intervention in order to improve the following deficits and impairments:  Decreased safety awareness  Visit Diagnosis: Unsteadiness on feet     Problem  List Patient Active Problem List   Diagnosis Date Noted  . Chronic pain syndrome   . Hypoalbuminemia due to protein-calorie malnutrition ([place])   . Acute blood loss anemia   . Benign prostatic hyperplasia   . Vascular headache   . Acute on chronic combined systolic and diastolic heart failure ([place])   . Sleep disturbance   . Embolic cerebral infarction ([place]) 01/06/2020  . Acute renal failure ([place])   . Left leg weakness   . Ventricular tachyarrhythmia ([place])   . Stroke (cerebrum) ([place]) 01/01/2020  . Normocytic anemia 06/04/2017  . COPD (chronic obstructive pulmonary disease) ([place]) 06/04/2017  . Pneumococcal bacteremia 05/28/2017  . Pneumococcal pneumonia ([place]) 05/27/2017  . IVDU (intravenous drug user)   . Liver fibrosis 04/24/2016  . Chronic hepatitis C without hepatic coma ([place]) 04/19/2015  . Substance abuse ([place]) 04/19/2015  . HTN (hypertension) 11/12/2013  . Depression 11/12/2013    Kerrie Pleasure, PT 01/18/2020, 1:11 PM  [facility] [address] [place], [place], [address] Phone: [phone]   Fax:  [phone]  Name: Ravinder Lamkin MRN: [id number] Date of Birth: 20-Jan-1951

## 2020-01-19 ENCOUNTER — Other Ambulatory Visit: Payer: Self-pay | Admitting: Cardiovascular Disease

## 2020-01-19 ENCOUNTER — Encounter: Payer: Self-pay | Admitting: Cardiovascular Disease

## 2020-01-19 ENCOUNTER — Ambulatory Visit: Payer: PPO | Admitting: Cardiovascular Disease

## 2020-01-19 VITALS — BP 132/62 | HR 74 | Ht 71.0 in | Wt 140.5 lb

## 2020-01-19 DIAGNOSIS — I6300 Cerebral infarction due to thrombosis of unspecified precerebral artery: Secondary | ICD-10-CM | POA: Diagnosis not present

## 2020-01-19 DIAGNOSIS — I5181 Takotsubo syndrome: Secondary | ICD-10-CM | POA: Diagnosis not present

## 2020-01-19 DIAGNOSIS — I5043 Acute on chronic combined systolic (congestive) and diastolic (congestive) heart failure: Secondary | ICD-10-CM | POA: Diagnosis not present

## 2020-01-19 MED ORDER — LOSARTAN POTASSIUM 50 MG PO TABS
50.0000 mg | ORAL_TABLET | Freq: Every day | ORAL | 3 refills | Status: DC
Start: 2020-01-19 — End: 2020-05-11

## 2020-01-19 MED ORDER — AMIODARONE HCL 200 MG PO TABS: 200.0000 mg | ORAL_TABLET | Freq: Every day | ORAL | 2 refills | Status: DC

## 2020-01-19 MED ORDER — ATORVASTATIN CALCIUM 40 MG PO TABS: 40.0000 mg | ORAL_TABLET | Freq: Every day | ORAL | 3 refills | Status: DC

## 2020-01-19 NOTE — Telephone Encounter (Signed)
ATC patient at [phone] left detailed message per DPR that RX for inhaler was sent in. Nothing further needed at this time.

## 2020-01-19 NOTE — Progress Notes (Signed)
Cardiology Office Note:    Date:  01/19/2020   ID:  Travis Salinas, DOB Aug 22, 1951, MRN [id number]  PCP:  Kathyrn Lass, MD  Cardiologist:  Cameran Pettey  Electrophysiologist:  None   Referring MD: Kathyrn Lass, MD   Chief Complaint  Patient presents with  . Chest Pain    History of Present Illness:    Seen with [name] ( ex wife, separated but still helps care for him )   Travis Salinas is a 69 y.o. male with a hx of polysubstance ( herion and cocaine)  abuse, COPD, hep C, HTN, CKD,  admitted recently to the hospital with stroke like symptoms   He received tPA.  He developed a wide complex tachycardia ,  Had 2 attempts at cardioversion without conversion and was started on amiodarone   He was found to have atrial fib and acute CHF with a Takotsubo pattern to his LV contractility. EF 35-40%..  Troponin was elevated in a pattern c/w demand ischemia  Was started on Coreg,  BP was too low to start ARB  Breathing is good. Does have some DOE Has COPD .  Has quit smoking  Has stopped cocaine and heroin  No cp       Past Medical History:  Diagnosis Date  . Arthritis   . Cervical disc herniation   . COPD (chronic obstructive pulmonary disease) ([place])   . Headache    " due to antibiotics"  . Hepatitis C   . Hypertension     Past Surgical History:  Procedure Laterality Date  . APPLICATION OF A-CELL OF EXTREMITY Left 12/05/2016   Procedure: APPLICATION OF A-CELL OF EXTREMITY;  Surgeon: Loel Lofty Dillingham, DO;  Location: [facility];  Service: Plastics;  Laterality: Left;  . APPLICATION OF WOUND VAC Left 12/05/2016   Procedure: APPLICATION OF WOUND VAC;  Surgeon: Loel Lofty Dillingham, DO;  Location: [facility];  Service: Plastics;  Laterality: Left;  . I & D EXTREMITY Left 11/03/2016   Procedure: IRRIGATION AND DEBRIDEMENT EXTREMITY;  Surgeon: Leandrew Koyanagi, MD;  Location: [facility];  Service: Orthopedics;  Laterality: Left;  . I & D EXTREMITY Left 12/05/2016   Procedure: IRRIGATION AND  DEBRIDEMENT EXTREMITY;  Surgeon: Loel Lofty Dillingham, DO;  Location: [facility];  Service: Plastics;  Laterality: Left;  . INCISION AND DRAINAGE ABSCESS Right 11/12/2013   Procedure: INCISION AND DRAINAGE AND OPEN PACKING OF RIGHT CALF  ABSCESS;  Surgeon: Earnstine Regal, MD;  [facility];  Service: General;  Laterality: Right;  . INCISION AND DRAINAGE OF WOUND Left 01/29/2017   Procedure: IRRIGATION AND DEBRIDEMENT OF LEFT LEG WOUND WITH ABRA PLACEMENT AND PLACEMENT OF WOUND VAC;  Surgeon: Wallace Going, DO;  Location[facility];  Service: Plastics;  Laterality: Left;  . KNEE SURGERY      Current Medications: Current Meds  Medication Sig  . acetaminophen (TYLENOL) 325 MG tablet Take 2 tablets (650 mg total) by mouth every 4 (four) hours as needed for mild pain (or temp > 37.5 C (99.5 F)).  Marland Kitchen albuterol (VENTOLIN HFA) 108 (90 Base) MCG/ACT inhaler Inhale 2 puffs into the lungs every 6 (six) hours as needed for wheezing or shortness of breath.  Marland Kitchen amiodarone (PACERONE) 200 MG tablet Take 1 tablet (200 mg total) by mouth daily.  . ARIPiprazole (ABILIFY) 10 MG tablet Take 1 tablet (10 mg total) by mouth daily.  Marland Kitchen aspirin EC 81 MG EC tablet Take 1 tablet (81 mg total)  by mouth daily.  Marland Kitchen atorvastatin (LIPITOR) 40 MG tablet Take 1 tablet (40 mg total) by mouth daily.  . carvedilol (COREG) 3.125 MG tablet Take 1 tablet (3.125 mg total) by mouth 2 (two) times daily with a meal.  . clonazePAM (KLONOPIN) 1 MG tablet Take 1 tablet (1 mg total) by mouth 2 (two) times daily.  . clopidogrel (PLAVIX) 75 MG tablet Take 1 tablet (75 mg total) by mouth daily.  Marland Kitchen FLUoxetine (PROZAC) 10 MG tablet Take 1 tablet (10 mg total) by mouth daily.  Marland Kitchen lamoTRIgine (LAMICTAL) 200 MG tablet Take 1 tablet (200 mg total) by mouth every morning.  . Multiple Vitamins-Minerals (MULTIVITAMIN WITH MINERALS) tablet Take 1 tablet by mouth daily.  Marland Kitchen oxyCODONE (OXYCONTIN) 20 mg 12 hr tablet Take 1 tablet (20 mg total) by mouth every 12  (twelve) hours.  . pantoprazole (PROTONIX) 40 MG tablet Take 1 tablet (40 mg total) by mouth at bedtime.  . polyethylene glycol (MIRALAX / GLYCOLAX) 17 g packet Take 17 g by mouth 2 (two) times daily.  . silodosin (RAPAFLO) 8 MG CAPS capsule Take 1 capsule (8 mg total) by mouth daily with breakfast.  . Tiotropium Bromide Monohydrate (SPIRIVA RESPIMAT) 2.5 MCG/ACT AERS Inhale 2 puffs into the lungs daily.  Marland Kitchen topiramate (TOPAMAX) 25 MG tablet Take 1 tablet (25 mg total) by mouth 2 (two) times daily.  Marland Kitchen umeclidinium-vilanterol (ANORO ELLIPTA) 62.5-25 MCG/INH AEPB Inhale 1 puff into the lungs daily.  . [DISCONTINUED] amiodarone (PACERONE) 200 MG tablet Take 1 tablet (200 mg total) by mouth daily.  . [DISCONTINUED] atorvastatin (LIPITOR) 40 MG tablet Take 1 tablet (40 mg total) by mouth daily.     Allergies:   Propoxyphene, Amoxicillin, Ciprofloxacin, and Depakote [divalproex sodium]   Social History   Socioeconomic History  . Marital status: Married    Spouse name: Not on file  . Number of children: Not on file  . Years of education: Not on file  . Highest education level: Not on file  Occupational History  . Not on file  Tobacco Use  . Smoking status: Former Smoker    Packs/day: 1.00    Years: 32.00    Pack years: 32.00    Types: Cigarettes    Quit date: 2002    Years since quitting: 19.3  . Smokeless tobacco: Never Used  Substance and Sexual Activity  . Alcohol use: No    Alcohol/week: 0.0 standard drinks  . Drug use: Yes    Types: Heroin, Cocaine    Comment: " relapsed once this year " 2018  . Sexual activity: Not on file  Other Topics Concern  . Not on file  Social History Narrative  . Not on file   Social Determinants of Health   Financial Resource Strain:   . Difficulty of Paying Living Expenses:   Food Insecurity:   . Worried About Charity fundraiser in the Last Year:   . Arboriculturist in the Last Year:   Transportation Needs:   . Film/video editor  (Medical):   Marland Kitchen Lack of Transportation (Non-Medical):   Physical Activity:   . Days of Exercise per Week:   . Minutes of Exercise per Session:   Stress:   . Feeling of Stress :   Social Connections:   . Frequency of Communication with Friends and Family:   . Frequency of Social Gatherings with Friends and Family:   . Attends Religious Services:   . Active Member of Clubs or  Organizations:   . Attends Archivist Meetings:   Marland Kitchen Marital Status:      Family History: The patient's family history includes Heart disease in his father and mother; Stroke in his father and mother.  ROS:   Please see the history of present illness.     All other systems reviewed and are negative.  EKGs/Labs/Other Studies Reviewed:    The following studies were reviewed today:   EKG:    Recent Labs: 01/02/2020: Magnesium 1.9; TSH 1.235 01/07/2020: ALT 22; BUN 15; Creatinine, Ser 1.18; Hemoglobin 12.3; Platelets 220; Potassium 4.3; Sodium 140  Recent Lipid Panel    Component Value Date/Time   CHOL 151 01/02/2020 0329   TRIG 97 01/02/2020 0329   HDL 45 01/02/2020 0329   CHOLHDL 3.4 01/02/2020 0329   VLDL 19 01/02/2020 0329   LDLCALC 87 01/02/2020 0329    Physical Exam:    VS:  BP 132/62   Pulse 74   Ht 5\' 11"  (1.803 m)   Wt 140 lb 8 oz (63.7 kg)   SpO2 95%   BMI 19.60 kg/m     Wt Readings from Last 3 Encounters:  01/19/20 140 lb 8 oz (63.7 kg)  01/12/20 137 lb 9.1 oz (62.4 kg)  01/01/20 150 lb (68 kg)     GEN:  chronically ill appearing male,  HEENT: Normal NECK: No JVD; No carotid bruits LYMPHATICS: No lymphadenopathy CARDIAC: RRR, no murmurs, rubs, gallops RESPIRATORY:  Clear to auscultation without rales, wheezing or rhonchi  ABDOMEN: Soft, non-tender, non-distended MUSCULOSKELETAL:  No edema; No deformity  SKIN: Warm and dry NEUROLOGIC:  Alert and oriented x 3 PSYCHIATRIC:  Normal affect   ASSESSMENT:    1. Takotsubo cardiomyopathy   2. Cerebrovascular accident  (CVA) due to thrombosis of precerebral artery ([place])   3. Acute on chronic combined systolic and diastolic heart failure (H[facility])    PLAN:    In order of problems listed above:  1.   History of recent stroke: [place] presented with a stroke symptoms.  He also had been using cocaine and had Takotsubo cardiomyopathy.  He received TPA and neurologically has recovered.  He was sent home on Plavix.  He was only sent home on 18 days of Plavix.  I think he would likely need Plavix lifelong but I would like to make sure that this is the plan from the neurology team.  He will need to call the neurologist and get a refill.  2.  Takotsubo syndrome: He had an acute systolic congestive heart failure.  Ejection fraction was around 35%.  He was started on carvedilol 3.125 mg twice a day.  At the time of discharge his blood pressure was too low to consider ARB.  We will be adding losartan 50 mg once a day.  He will need a basic metabolic profile in 2 to 3 weeks.  We will have him get an echocardiogram in 2 to 3 months.  I will have him see my APP shortly after the echocardiogram for follow-up.  My anticipation and hope is that his ejection fraction will have normalized by that time.  3.  Paroxysmal atrial fibrillation: He presented with rapid atrial fibrillation with aberrant conduction.  This was in the setting of cocaine abuse.  He is not had any further episodes of atrial fibrillation.  He is been on amiodarone.  Anticipate stopping the amiodarone after 3 to 6 months assuming that he has no further episodes of atrial fibrillation.   Medication  Adjustments/Labs and Tests Ordered: Current medicines are reviewed at length with the patient today.  Concerns regarding medicines are outlined above.  Orders Placed This Encounter  Procedures  . Basic Metabolic Panel (BMET)  . ECHOCARDIOGRAM COMPLETE   Meds ordered this encounter  Medications  . losartan (COZAAR) 50 MG tablet    Sig: Take 1 tablet (50 mg total) by  mouth daily.    Dispense:  90 tablet    Refill:  3  . amiodarone (PACERONE) 200 MG tablet    Sig: Take 1 tablet (200 mg total) by mouth daily.    Dispense:  90 tablet    Refill:  2  . atorvastatin (LIPITOR) 40 MG tablet    Sig: Take 1 tablet (40 mg total) by mouth daily.    Dispense:  90 tablet    Refill:  3    Patient Instructions  Medication Instructions:  Your physician has recommended you make the following change in your medication:  START Losartan (Cozaar) 50 mg once daily  *If you need a refill on your cardiac medications before your next appointment, please call your pharmacy*   Lab Work: Your physician recommends that you return for lab work in: 3 weeks for basic metabolic panel  If you have labs (blood work) drawn today and your tests are completely normal, you will receive your results only by: Marland Kitchen MyChart Message (if you have MyChart) OR . A paper copy in the mail If you have any lab test that is abnormal or we need to change your treatment, we will call you to review the results.   Testing/Procedures: Your physician has requested that you have an echocardiogram in 3 months prior to office visit. Echocardiography is a painless test that uses sound waves to create images of your heart. It provides your doctor with information about the size and shape of your heart and how well your heart's chambers and valves are working. This procedure takes approximately one hour. There are no restrictions for this procedure.    Follow-Up: At [facility], you and your health needs are our priority.  As part of our continuing mission to provide you with exceptional heart care, we have created designated Provider Care Teams.  These Care Teams include your primary Cardiologist (physician) and Advanced Practice Providers (APPs -  Physician Assistants and Nurse Practitioners) who all work together to provide you with the care you need, when you need it.   Your next appointment:   3  month(s)  The format for your next appointment:   In Person  Provider:   Richardson Dopp, PA-C or Robbie Lis, PA-C       Signed, Mertie Moores, MD  01/19/2020 5:56 PM    [place]

## 2020-01-19 NOTE — Patient Instructions (Signed)
Medication Instructions:  Your physician has recommended you make the following change in your medication:  START Losartan (Cozaar) 50 mg once daily  *If you need a refill on your cardiac medications before your next appointment, please call your pharmacy*   Lab Work: Your physician recommends that you return for lab work in: 3 weeks for basic metabolic panel  If you have labs (blood work) drawn today and your tests are completely normal, you will receive your results only by: Marland Kitchen MyChart Message (if you have MyChart) OR . A paper copy in the mail If you have any lab test that is abnormal or we need to change your treatment, we will call you to review the results.   Testing/Procedures: Your physician has requested that you have an echocardiogram in 3 months prior to office visit. Echocardiography is a painless test that uses sound waves to create images of your heart. It provides your doctor with information about the size and shape of your heart and how well your heart's chambers and valves are working. This procedure takes approximately one hour. There are no restrictions for this procedure.    Follow-Up: At [facility], you and your health needs are our priority.  As part of our continuing mission to provide you with exceptional heart care, we have created designated Provider Care Teams.  These Care Teams include your primary Cardiologist (physician) and Advanced Practice Providers (APPs -  Physician Assistants and Nurse Practitioners) who all work together to provide you with the care you need, when you need it.   Your next appointment:   3 month(s)  The format for your next appointment:   In Person  Provider:   Richardson Dopp, PA-C or Robbie Lis, PA-C

## 2020-01-20 ENCOUNTER — Telehealth: Payer: Self-pay | Admitting: Neurology

## 2020-01-20 NOTE — Telephone Encounter (Signed)
I spoke with Maricela Bo, NP in regards to this message since Dr. Leonie Man is out of the office.  Pt was evaluated by Dr. Leonie Man in the [facility] but does not have a f/u appt until June. Per Maricela Bo, NP pt can have plavix refilled through PCP until ov with [place] is completed.   I called the pt's wife and advised of this information and she verbalized understanding.

## 2020-01-20 NOTE — Telephone Encounter (Signed)
Pt wife called to verify if pt should discontinue taking plavix or if pt could have additional refills until his apt. Pt wife states pt was prescribed 18 pills for 18 days and they are concerned he might be out of medication before apt

## 2020-01-25 ENCOUNTER — Other Ambulatory Visit: Payer: Self-pay

## 2020-01-25 ENCOUNTER — Other Ambulatory Visit (HOSPITAL_COMMUNITY): Payer: Self-pay | Admitting: Family Medicine

## 2020-01-25 ENCOUNTER — Ambulatory Visit: Payer: PPO

## 2020-01-25 DIAGNOSIS — I69354 Hemiplegia and hemiparesis following cerebral infarction affecting left non-dominant side: Secondary | ICD-10-CM | POA: Diagnosis not present

## 2020-01-25 DIAGNOSIS — R519 Headache, unspecified: Secondary | ICD-10-CM | POA: Diagnosis not present

## 2020-01-25 DIAGNOSIS — Z8673 Personal history of transient ischemic attack (TIA), and cerebral infarction without residual deficits: Secondary | ICD-10-CM | POA: Diagnosis not present

## 2020-01-25 DIAGNOSIS — R296 Repeated falls: Secondary | ICD-10-CM | POA: Diagnosis not present

## 2020-01-25 DIAGNOSIS — Z0189 Encounter for other specified special examinations: Secondary | ICD-10-CM | POA: Diagnosis not present

## 2020-01-25 MED ORDER — CARVEDILOL 3.125 MG PO TABS: 3.1250 mg | ORAL_TABLET | Freq: Two times a day (BID) | ORAL | 3 refills | Status: DC

## 2020-01-25 NOTE — Telephone Encounter (Signed)
Pt's medication was sent to pt's pharmacy as requested. Confirmation received.  °

## 2020-01-25 NOTE — Therapy (Addendum)
[facility] [address] [place], [place], [address] Phone: [phone]   Fax:  [phone]  Patient Details  Name: Travis Salinas MRN: [id number] Date of Birth: 03-16-1951 Referring Provider:  Kathyrn Lass, MD  Encounter Date: 01/25/2020 Patient reported that he is having chest pain since yesterday. Pain goes to L axilla. Pt looked little more unsteady compared to initial evaluation. Significant other reported that he walks okay when he goes for walking.  Vitals were taken: BP 160/75, HR 78bpm, 98% SpO2 Pt educated that he should follow up with PCP (they have appt today at 4pm) for further recommendations. Pt advised to go to ED if symptoms worsen before his MD appt today.  Kerrie Pleasure, PT 01/25/2020, 12:58 PM  [facility] [address] [place], [place], [address] Phone: [phone]   Fax:  [phone]

## 2020-02-03 ENCOUNTER — Other Ambulatory Visit: Payer: Self-pay

## 2020-02-03 ENCOUNTER — Encounter: Payer: Self-pay | Admitting: Physical Medicine & Rehabilitation

## 2020-02-03 ENCOUNTER — Encounter: Payer: PPO | Attending: Physical Medicine & Rehabilitation | Admitting: Physical Medicine & Rehabilitation

## 2020-02-03 VITALS — BP 148/76 | HR 68 | Temp 98.9°F | Ht 70.0 in | Wt 145.2 lb

## 2020-02-03 DIAGNOSIS — I69319 Unspecified symptoms and signs involving cognitive functions following cerebral infarction: Secondary | ICD-10-CM | POA: Diagnosis not present

## 2020-02-03 NOTE — Patient Instructions (Signed)
Lidoderm patch apply to chest

## 2020-02-03 NOTE — Progress Notes (Signed)
Subjective:    Patient ID: Travis Salinas, male    DOB: 1951-04-30, 69 y.o.   MRN: [id number] 68 y.o. right-handed male with history of chronic combined systolic diastolic congestive heart failure, chronic pain syndrome maintained on oxycodone, BPH, CKD stage II, COPD, hepatitis C, depression, polysubstance abuse and hypertension. Per chart review lives with roommate independent prior to admission he is separated from his wife. Plans to discharge home with his wife with assistance as needed. Presented 01/01/2020 with gait instability dizziness left-sided weakness as well as reported numerous falls without loss of conscious. Cranial CT scan negative for acute changes. Noted chronic small vessel changes of the white matter. Patient did not receive TPA. CT cervical spine negative. MRI/MRA showed several small acute infarcts involving multiple vascular territories bilaterally suggesting a central source. No proximal intracranial vessel occlusion or stenosis. Echocardiogram with ejection fraction of [id number] grade 1 diastolic dysfunction. Admission chemistries BUN 41 creatinine 1.44 urine drug screen positive opiates cocaine and benzos, troponin high-sensitivity 585, CK 106. Carotid Dopplers with no ICA stenosis. Cardiology service was consulted for complex tachycardia with underlying bundle branch block was placed on intravenous amiodarone transitioned to p.o. as well as started on Coreg. Attempts at cardioversion unsuccessful. His elevated troponin felt to be related to demand ischemia. Currently maintained on aspirin Plavix for CVA prophylaxis. He had been on Cleviprex for blood pressure control. Therapy evaluations completed and patient was admitted for a comprehensive rehab program  Admit date: 01/06/2020 Discharge date: 01/12/2020  HPI Stroke rehab followup  69 year old male who was [facility].  He has alternated living at his ex-wife's  home as well as a his own home.  He has a roommate at his own home he does cooking. The patient indicates that he is independent with all self-care and mobility. Pt fell at home on to floor Left upper chest is sore, some pain with deep breath and more so with sneezing and coughing  He was examined by his primary care physician the day after this happened.  No x-rays were taken.  Patient is not sure whether he wants an x-ray performed.  Pain seemed like it was getting better but now has not changed a lot recently. The patient was not driving prior to his stroke, he is retired Pain Inventory Average Pain 7 Pain Right Now 6 My pain is constant and aching  In the last 24 hours, has pain interfered with the following? General activity 5 Relation with others 5 Enjoyment of life 5 What TIME of day is your pain at its worst? night Sleep (in general) Fair  Pain is worse with: laying down Pain improves with: medication Relief from Meds: 6  Mobility how many minutes can you walk? 20 ability to climb steps?  yes do you drive?  no  Function retired I need assistance with the following:  shopping  Neuro/Psych weakness trouble walking depression anxiety  Prior Studies HFU  Physicians involved in your care HFU   Family History  Problem Relation Age of Onset  . Heart disease Mother   . Stroke Mother   . Heart disease Father   . Stroke Father    Social History   Socioeconomic History  . Marital status: Married    Spouse name: Not on file  . Number of children: Not on file  . Years of education: Not on file  . Highest education level: Not on file  Occupational History  .  Not on file  Tobacco Use  . Smoking status: Former Smoker    Packs/day: 1.00    Years: 32.00    Pack years: 32.00    Types: Cigarettes    Quit date: 2002    Years since quitting: 19.4  . Smokeless tobacco: Never Used  Substance and Sexual Activity  . Alcohol use: No    Alcohol/week: 0.0 standard  drinks  . Drug use: Yes    Types: Heroin, Cocaine    Comment: " relapsed once this year " 2018  . Sexual activity: Not on file  Other Topics Concern  . Not on file  Social History Narrative  . Not on file   Social Determinants of Health   Financial Resource Strain:   . Difficulty of Paying Living Expenses:   Food Insecurity:   . Worried About Charity fundraiser in the Last Year:   . Arboriculturist in the Last Year:   Transportation Needs:   . Film/video editor (Medical):   Marland Kitchen Lack of Transportation (Non-Medical):   Physical Activity:   . Days of Exercise per Week:   . Minutes of Exercise per Session:   Stress:   . Feeling of Stress :   Social Connections:   . Frequency of Communication with Friends and Family:   . Frequency of Social Gatherings with Friends and Family:   . Attends Religious Services:   . Active Member of Clubs or Organizations:   . Attends Archivist Meetings:   Marland Kitchen Marital Status:    Past Surgical History:  Procedure Laterality Date  . APPLICATION OF A-CELL OF EXTREMITY Left 12/05/2016   Procedure: APPLICATION OF A-CELL OF EXTREMITY;  Surgeon: Loel Lofty Dillingham, DO;  Location: [facility];  Service: Plastics;  Laterality: Left;  . APPLICATION OF WOUND VAC Left 12/05/2016   Procedure: APPLICATION OF WOUND VAC;  Surgeon: Loel Lofty Dillingham, DO;  Location: [facility];  Service: Plastics;  Laterality: Left;  . I & D EXTREMITY Left 11/03/2016   Procedure: IRRIGATION AND DEBRIDEMENT EXTREMITY;  Surgeon: Leandrew Koyanagi, MD;  Location: [facility];  Service: Orthopedics;  Laterality: Left;  . I & D EXTREMITY Left 12/05/2016   Procedure: IRRIGATION AND DEBRIDEMENT EXTREMITY;  Surgeon: Loel Lofty Dillingham, DO;  Location: [facility];  Service: Plastics;  Laterality: Left;  . INCISION AND DRAINAGE ABSCESS Right 11/12/2013   Procedure: INCISION AND DRAINAGE AND OPEN PACKING OF RIGHT CALF  ABSCESS;  Surgeon: Earnstine Regal, MD;  Location: [facility];  Service: General;  Laterality:  Right;  . INCISION AND DRAINAGE OF WOUND Left 01/29/2017   Procedure: IRRIGATION AND DEBRIDEMENT OF LEFT LEG WOUND WITH ABRA PLACEMENT AND PLACEMENT OF WOUND VAC;  Surgeon: Wallace Going, DO;  Location: [facility];  Service: Plastics;  Laterality: Left;  . KNEE SURGERY     Past Medical History:  Diagnosis Date  . Arthritis   . Cervical disc herniation   . COPD (chronic obstructive pulmonary disease) ([place])   . Headache    " due to antibiotics"  . Hepatitis C   . Hypertension    BP (!) 148/76   Pulse 68   Temp 98.9 F (37.2 C)   Ht 5\' 10"  (1.778 m)   Wt 145 lb 3.2 oz (65.9 kg)   SpO2 96%   BMI 20.83 kg/m   Opioid Risk Score:   Fall Risk Score:  `1  Depression screen PHQ 2/9  Depression screen [facility] 2/9 02/03/2020 07/21/2018  04/06/2018 10/27/2017 11/25/2016 09/19/2016 05/15/2016  Decreased Interest 2 1 1 1 1 1 1   Down, Depressed, Hopeless 3 1 2 2 1 1 1   PHQ - 2 Score 5 2 3 3 2 2 2   Altered sleeping 2 0 1 0 2 0 3  Tired, decreased energy 1 1 3 2 1 1 2   Change in appetite 0 0 0 0 2 0 2  Feeling bad or failure about yourself  2 0 1 - 1 0 1  Trouble concentrating 0 0 0 0 0 0 2  Moving slowly or fidgety/restless 1 1 1  0 1 1 2   Suicidal thoughts 0 0 0 0 0 0 0  PHQ-9 Score 11 4 9 5 9 4 14   Difficult doing work/chores Somewhat difficult Somewhat difficult Somewhat difficult Not difficult at all Somewhat difficult Somewhat difficult Very difficult    Review of Systems  Respiratory: Positive for shortness of breath.   Neurological: Positive for weakness and numbness.  Psychiatric/Behavioral: Positive for dysphoric mood. The patient is nervous/anxious.   All other systems reviewed and are negative.      Objective:   Physical Exam Vitals and nursing note reviewed.  Constitutional:      Appearance: Normal appearance.  HENT:     Head: Normocephalic and atraumatic.     Mouth/Throat:     Mouth: Mucous membranes are dry.     Pharynx: Oropharynx is clear.  Eyes:     General: No  visual field deficit.    Extraocular Movements: Extraocular movements intact.     Conjunctiva/sclera: Conjunctivae normal.     Pupils: Pupils are equal, round, and reactive to light.  Neck:     Comments: Patient can only turn and rotate head 25% toward the right side 100% to the left Cardiovascular:     Rate and Rhythm: Normal rate and regular rhythm.     Heart sounds: Normal heart sounds. No murmur.  Pulmonary:     Effort: Pulmonary effort is normal. No respiratory distress.     Breath sounds: Normal breath sounds. No wheezing.     Comments: Left upper chest 2 intercostal spaces above the left nipple with tenderness Chest:     Chest wall: Tenderness present.  Abdominal:     General: Abdomen is flat. Bowel sounds are normal. There is no distension.     Palpations: Abdomen is soft.     Tenderness: There is no abdominal tenderness.  Musculoskeletal:     Right lower leg: No edema.     Left lower leg: No edema.     Comments: No pain with left upper extremity range of motion  Skin:    Findings: No bruising.  Neurological:     Mental Status: He is alert and oriented to person, place, and time.     Cranial Nerves: No dysarthria or facial asymmetry.     Sensory: Sensation is intact.     Motor: No weakness.     Gait: Tandem walk abnormal.     Comments: Has difficulty with serial sevens.  His is able to spell world forward and backward.  Oriented to person time and generally to place.  He knows he is in [place] at my office but cannot tell me what street we are on. He remembers 1 out of 3 unrelated objects after 2-minute delay The patient is able to perform standard gait and is able to toe walk and heel walk but was unable to perform tandem gait  Assessment & Plan:  #1.  Bilateral cerebral infarcts likely cardiac emboli.  We discussed cocaine as a potential etiologic factor. His main residual deficits are cognition including attention and memory as well as a balance.  He  will be starting physical therapy next week.  Is not clear how his current cognition differs from the stroke.  May consider outpatient speech therapy  Patient will follow-up with neurology Has appointment with Dr. Reginal Lutes up with cardiology Dr. Acie Fredrickson PCP follow-up with Dr. Sabra Heck Sports medicine follow-up with Dr. Alfonso Ramus   Patient will not need to physical medicine rehab follow-up  Patient is not a candidate for return to driving since he is not having particular stroke I do not think he is able to drive based on his cognition.

## 2020-02-05 DIAGNOSIS — R040 Epistaxis: Secondary | ICD-10-CM | POA: Diagnosis not present

## 2020-02-05 DIAGNOSIS — J309 Allergic rhinitis, unspecified: Secondary | ICD-10-CM | POA: Diagnosis not present

## 2020-02-06 DIAGNOSIS — R0789 Other chest pain: Secondary | ICD-10-CM | POA: Diagnosis not present

## 2020-02-09 ENCOUNTER — Other Ambulatory Visit: Payer: Self-pay

## 2020-02-09 ENCOUNTER — Other Ambulatory Visit: Payer: PPO | Admitting: *Deleted

## 2020-02-09 DIAGNOSIS — I6300 Cerebral infarction due to thrombosis of unspecified precerebral artery: Secondary | ICD-10-CM

## 2020-02-09 DIAGNOSIS — I5043 Acute on chronic combined systolic (congestive) and diastolic (congestive) heart failure: Secondary | ICD-10-CM

## 2020-02-09 DIAGNOSIS — I5181 Takotsubo syndrome: Secondary | ICD-10-CM

## 2020-02-09 LAB — BASIC METABOLIC PANEL
BUN/Creatinine Ratio: 29 — ABNORMAL HIGH (ref 10–24)
BUN: 31 mg/dL — ABNORMAL HIGH (ref 8–27)
CO2: 22 mmol/L (ref 20–29)
Calcium: 9.8 mg/dL (ref 8.6–10.2)
Chloride: 106 mmol/L (ref 96–106)
Creatinine, Ser: 1.07 mg/dL (ref 0.76–1.27)
GFR calc Af Amer: 83 mL/min/{1.73_m2} (ref >59)
GFR calc non Af Amer: 71 mL/min/{1.73_m2} (ref >59)
Glucose: 90 mg/dL (ref 65–99)
Potassium: 4.1 mmol/L (ref 3.5–5.2)
Sodium: 143 mmol/L (ref 134–144)

## 2020-02-10 ENCOUNTER — Ambulatory Visit: Payer: PPO

## 2020-02-17 ENCOUNTER — Other Ambulatory Visit: Payer: Self-pay

## 2020-02-17 ENCOUNTER — Ambulatory Visit: Payer: PPO | Attending: Physician Assistant

## 2020-02-17 DIAGNOSIS — R2681 Unsteadiness on feet: Secondary | ICD-10-CM | POA: Insufficient documentation

## 2020-02-17 DIAGNOSIS — I6310 Cerebral infarction due to embolism of unspecified precerebral artery: Secondary | ICD-10-CM | POA: Insufficient documentation

## 2020-02-17 NOTE — Therapy (Signed)
[facility] [address] [place], [place], [address] Phone: [phone]   Fax:  [phone]  Physical Therapy Re-certification  Patient Details  Name: Travis Salinas MRN: [id number] Date of Birth: 23-Apr-1951 Referring Provider (PT): [place]   Encounter Date: 02/17/2020   PT End of Session - 02/17/20 1501    Visit Number 2    Number of Visits 6    Date for PT Re-Evaluation 03/16/20    PT Start Time 1400    PT Stop Time 1445    PT Time Calculation (min) 45 min    Equipment Utilized During Treatment Gait belt    Activity Tolerance Other (comment)    Behavior During Therapy [facility] for tasks assessed/performed           Past Medical History:  Diagnosis Date  . Arthritis   . Cervical disc herniation   . COPD (chronic obstructive pulmonary disease) ([place])   . Headache    " due to antibiotics"  . Hepatitis C   . Hypertension     Past Surgical History:  Procedure Laterality Date  . APPLICATION OF A-CELL OF EXTREMITY Left 12/05/2016   Procedure: APPLICATION OF A-CELL OF EXTREMITY;  Surgeon: Loel Lofty Dillingham, DO;  Location: [facility];  Service: Plastics;  Laterality: Left;  . APPLICATION OF WOUND VAC Left 12/05/2016   Procedure: APPLICATION OF WOUND VAC;  Surgeon: Loel Lofty Dillingham, DO;  Location: [facility];  Service: Plastics;  Laterality: Left;  . I & D EXTREMITY Left 11/03/2016   Procedure: IRRIGATION AND DEBRIDEMENT EXTREMITY;  Surgeon: Leandrew Koyanagi, MD;  Location: [facility];  Service: Orthopedics;  Laterality: Left;  . I & D EXTREMITY Left 12/05/2016   Procedure: IRRIGATION AND DEBRIDEMENT EXTREMITY;  Surgeon: Loel Lofty Dillingham, DO;  Location: [facility];  Service: Plastics;  Laterality: Left;  . INCISION AND DRAINAGE ABSCESS Right 11/12/2013   Procedure: INCISION AND DRAINAGE AND OPEN PACKING OF RIGHT CALF  ABSCESS;  Surgeon: Earnstine Regal, MD;  [facility];  Service: General;  Laterality: Right;  . INCISION AND  DRAINAGE OF WOUND Left 01/29/2017   Procedure: IRRIGATION AND DEBRIDEMENT OF LEFT LEG WOUND WITH ABRA PLACEMENT AND PLACEMENT OF WOUND VAC;  Surgeon: Wallace Going, DO;  [facility];  Service: Plastics;  Laterality: Left;  . KNEE SURGERY      There were no vitals filed for this visit.   Subjective Assessment - 02/17/20 1415    Subjective Pt reports he sprained his ribs and that is why he was having chest pain. Currently rib pain is 8/10    Patient is accompained by: Family member    Limitations Walking;Standing    How long can you sit comfortably? no limits    How long can you walk comfortably? 30 min    Patient Stated Goals get better    Currently in Pain? Yes    Pain Location Rib cage                       Treatment Gait training: cues for arm swings: 200' Tandem walking fwd : min A: 5 x 10 feet Tandem walking fwd and bwd: using counterto: 3x each way Pt educated on counting to 3 sec when standing up and walking Pt educated on bracing his ribs with pillow when coughing of sneezing. Pt educated on avoiding activities or positions that increases rib pain.  PT Education - 02/17/20 1501    Education Details Pt educated to count to 3 sec before he starts walking immediately after standing up from sitting position. Educated on [employer]) Educated Patient;Other (comment)    Methods Explanation    Comprehension Verbalized understanding               PT Long Term Goals - 01/18/20 1257      PT LONG TERM GOAL #1   Title Pt will demonstrate improved visual acuity when stepping over, around obstacles to improve safety awareness.    Baseline Pt looks straight ahead, increased risk for tripping.    Time 1    Period Weeks    Status New    Target Date 01/25/20                 Plan - 02/17/20 1457    Clinical Impression Statement Pt was groggy today due to lack of sleep from rib pain. Pt was significantly challanged my  tandem walking as he needed min A without support surface to prevent from falling. Patient needed repeated cueing due to grogginess. Patient was educated to follow up with doctor if grogginess continues. Pt has poor safety awareness with sit to stand transfers as sometimes he gets up and starts moving right away which causes imbalance in his gait. pt will need 4 more sessions to go over safety awarness, to work on stepping over obstacles, around obstackes to improve walking balance.    Personal Factors and Comorbidities Time since onset of injury/illness/exacerbation    Examination-Activity Limitations Stairs    Stability/Clinical Decision Making Stable/Uncomplicated    Clinical Decision Making Low    Rehab Potential Excellent    PT Frequency 1x / week    PT Duration 4 weeks   1 week   PT Treatment/Interventions Patient/family education;Gait training;Stair training;Functional mobility training;Therapeutic activities;Therapeutic exercise;Balance training;Neuromuscular re-education    PT Next Visit Plan Go over improving visual acuity when stepping on, over around obstalces    PT Home Exercise Plan Walking program for 30 min /day    Consulted and Agree with Plan of Care Patient;Family member/caregiver    Family Member Consulted [name]           Patient will benefit from skilled therapeutic intervention in order to improve the following deficits and impairments:  Decreased safety awareness  Visit Diagnosis: Unsteadiness on feet  Cerebral infarction due to embolism of precerebral artery [facility])     Problem List Patient Active Problem List   Diagnosis Date Noted  . Chronic pain syndrome   . Hypoalbuminemia due to protein-calorie malnutrition ([place])   . Acute blood loss anemia   . Benign prostatic hyperplasia   . Vascular headache   . Acute on chronic combined systolic and diastolic heart failure ([place])   . Sleep disturbance   . Embolic cerebral infarction ([place]) 01/06/2020  . Acute  renal failure ([place])   . Left leg weakness   . Ventricular tachyarrhythmia ([place])   . Stroke (cerebrum) ([place]) 01/01/2020  . Normocytic anemia 06/04/2017  . COPD (chronic obstructive pulmonary disease) ([place]) 06/04/2017  . Pneumococcal bacteremia 05/28/2017  . Pneumococcal pneumonia ([place]) 05/27/2017  . IVDU (intravenous drug user)   . Liver fibrosis 04/24/2016  . Chronic hepatitis C without hepatic coma ([place]) 04/19/2015  . Substance abuse ([place]) 04/19/2015  . HTN (hypertension) 11/12/2013  . Depression 11/12/2013    Kerrie Pleasure 02/17/2020, 3:04 PM  [facility] [address] [place], [place], [address] Phone: [phone]   Fax:  [phone]  Name: Jemiah [place]burg MRN: [id number] Date of Birth: 1951-01-25

## 2020-02-23 DIAGNOSIS — F341 Dysthymic disorder: Secondary | ICD-10-CM | POA: Diagnosis not present

## 2020-02-23 DIAGNOSIS — F3341 Major depressive disorder, recurrent, in partial remission: Secondary | ICD-10-CM | POA: Diagnosis not present

## 2020-02-23 DIAGNOSIS — F411 Generalized anxiety disorder: Secondary | ICD-10-CM | POA: Diagnosis not present

## 2020-02-25 ENCOUNTER — Other Ambulatory Visit: Payer: Self-pay

## 2020-02-25 ENCOUNTER — Ambulatory Visit: Payer: PPO

## 2020-02-25 DIAGNOSIS — R2681 Unsteadiness on feet: Secondary | ICD-10-CM

## 2020-02-25 DIAGNOSIS — I6310 Cerebral infarction due to embolism of unspecified precerebral artery: Secondary | ICD-10-CM

## 2020-02-25 NOTE — Therapy (Signed)
[facility] [address] [place], [place], [address] Phone: [phone]   Fax:  [phone]  Physical Therapy Treatment  Patient Details  Name: Travis Salinas MRN: [id number] Date of Birth: 1951/04/27 Referring Provider (PT): [place]   Encounter Date: 02/25/2020   PT End of Session - 02/25/20 1240    Visit Number 3    Number of Visits 6    Date for PT Re-Evaluation 03/16/20    PT Start Time 1120    PT Stop Time 1205    PT Time Calculation (min) 45 min    Equipment Utilized During Treatment Gait belt    Activity Tolerance Other (comment)    Behavior During Therapy [facility] for tasks assessed/performed           Past Medical History:  Diagnosis Date  . Arthritis   . Cervical disc herniation   . COPD (chronic obstructive pulmonary disease) ([place])   . Headache    " due to antibiotics"  . Hepatitis C   . Hypertension     Past Surgical History:  Procedure Laterality Date  . APPLICATION OF A-CELL OF EXTREMITY Left 12/05/2016   Procedure: APPLICATION OF A-CELL OF EXTREMITY;  Surgeon: Loel Lofty Dillingham, DO;  Location: [facility];  Service: Plastics;  Laterality: Left;  . APPLICATION OF WOUND VAC Left 12/05/2016   Procedure: APPLICATION OF WOUND VAC;  Surgeon: Loel Lofty Dillingham, DO;  Location: [facility];  Service: Plastics;  Laterality: Left;  . I & D EXTREMITY Left 11/03/2016   Procedure: IRRIGATION AND DEBRIDEMENT EXTREMITY;  Surgeon: Leandrew Koyanagi, MD;  Location: [facility];  Service: Orthopedics;  Laterality: Left;  . I & D EXTREMITY Left 12/05/2016   Procedure: IRRIGATION AND DEBRIDEMENT EXTREMITY;  Surgeon: Loel Lofty Dillingham, DO;  Location: [facility];  Service: Plastics;  Laterality: Left;  . INCISION AND DRAINAGE ABSCESS Right 11/12/2013   Procedure: INCISION AND DRAINAGE AND OPEN PACKING OF RIGHT CALF  ABSCESS;  Surgeon: Earnstine Regal, MD;  Location: [facility];  Service: General;  Laterality: Right;  . INCISION AND DRAINAGE OF  WOUND Left 01/29/2017   Procedure: IRRIGATION AND DEBRIDEMENT OF LEFT LEG WOUND WITH ABRA PLACEMENT AND PLACEMENT OF WOUND VAC;  Surgeon: Wallace Going, DO;  Location: [facility];  Service: Plastics;  Laterality: Left;  . KNEE SURGERY      There were no vitals filed for this visit.   Subjective Assessment - 02/25/20 1228    Subjective Pt reports that he had another fall 2-3 weeks ago. He was carrying a plate of food and was trying to put it on coffee table and lost his balance and almost went over the table. No significant injuries reported. Pt reports his rib pain is improving (from previous fall). He has moved in with his room mate for past 2-3 weeks now.    Patient is accompained by: Family member    Limitations Walking;Standing    Diagnostic tests IMPRESSION:Several small acute infarcts involving multiple vascular territoriesbilaterally suggesting a central source. No proximal intracranialvessel occlusion or significant stenosis. Chronic microvascular ischemic changes.    Patient Stated Goals get better              Treatment: Gait training: worked on combination of 90 deg turns, 180 deg turns while walking, pt required cueing to stop for 1-2 seconds when performing 180 deg turns and waiting before he continues to walk. Pt required occasional CGA to stabilize balance with walking. We worked  on carrying laundry basket (to restrict visual field in front) and then walking around mat tables and furniture, stepping on ramp and curb step, pt required cueing to move the laundry basket to the side and looking down when going up and down the curb to improve visual acuity.   Obstacle course: stepping over 4" hurdle, stepping on 4" and 6" box, wlaking on 10 feet of balance beam: 8 times total, cueing to slow down, Stopping before moving on to next obstacle. Finding his balance before moving foot forward when walking on balance beam. Balance beam was most challenging. Patient required min A to  regain balance multiple times during this activity.                            PT Long Term Goals - 02/25/20 1239      PT LONG TERM GOAL #1   Title Pt will demonstrate improved visual acuity when stepping over, around obstacles to improve safety awareness.    Baseline Pt looks straight ahead, increased risk for tripping. needs 50% cueing    Time 1    Period Weeks    Status On-going      PT LONG TERM GOAL #2   Title Patient will requre <10% cueing to improve safety awareness and slowing down when performing high level activities such as stepping up and down, stepping over obstacles, stepping around obstacles to improve safety and reduce fall risk.    Baseline requires 50% cueing (02/25/20)    Time 4    Period Weeks    Status Revised                 Plan - 02/25/20 1229    Clinical Impression Statement Today's session was focused on improving patient's safety awareness. Patient was educated that when he stands up, he should wait for count of 3 before he starts moving forward. We practiced stepping over obstacles, stepping aound furniture, carrying basket (to limit visual) and walking around obstacles. Pt needed 50% cueing to slow down and to improve safety during activities. Patient will continue to benefit from skilled PT to improve safety awareness. New long term goal will be created regarding safety insturctions.    Personal Factors and Comorbidities Time since onset of injury/illness/exacerbation    Examination-Activity Limitations Stairs    Stability/Clinical Decision Making Stable/Uncomplicated    Rehab Potential Excellent    PT Frequency 1x / week    PT Duration 4 weeks   1 week   PT Treatment/Interventions Patient/family education;Gait training;Stair training;Functional mobility training;Therapeutic activities;Therapeutic exercise;Balance training;Neuromuscular re-education    PT Next Visit Plan Go over improving visual acuity when stepping on, over  around obstalces    PT Home Exercise Plan Walking program for 30 min /day    Consulted and Agree with Plan of Care Patient;Family member/caregiver    Family Member Consulted [name]           Patient will benefit from skilled therapeutic intervention in order to improve the following deficits and impairments:  Decreased safety awareness  Visit Diagnosis: 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 Chronic pain syndrome   . Hypoalbuminemia due to protein-calorie malnutrition ([place])   . Acute blood loss anemia   . Benign prostatic hyperplasia   . Vascular headache   . Acute on chronic  combined systolic and diastolic heart failure ([place])   . Sleep disturbance   . Embolic cerebral infarction ([facility]) 01/06/2020  . Acute renal failure ([facility])   . Left leg weakness   . Ventricular tachyarrhythmia ([facility])   . Stroke (cerebrum) ([place]) 01/01/2020  . Normocytic anemia 06/04/2017  . COPD (chronic obstructive pulmonary disease) ([facility]) 06/04/2017  . Pneumococcal bacteremia 05/28/2017  . Pneumococcal pneumonia ([place]) 05/27/2017  . IVDU (intravenous drug user)   . Liver fibrosis 04/24/2016  . Chronic hepatitis C without hepatic coma ([place]) 04/19/2015  . Substance abuse ([facility]) 04/19/2015  . HTN (hypertension) 11/12/2013  . Depression 11/12/2013    Kerrie Pleasure, PT 02/25/2020, 12:45 PM  [facility] [address] [place], [place], [address] Phone: [phone]   Fax:  [phone]  Name: Travis Salinas MRN: [id number] Date of Birth: [birth year]

## 2020-02-29 ENCOUNTER — Other Ambulatory Visit (HOSPITAL_COMMUNITY): Payer: Self-pay | Admitting: Adult Health

## 2020-02-29 DIAGNOSIS — R351 Nocturia: Secondary | ICD-10-CM | POA: Diagnosis not present

## 2020-02-29 DIAGNOSIS — N401 Enlarged prostate with lower urinary tract symptoms: Secondary | ICD-10-CM | POA: Diagnosis not present

## 2020-03-02 ENCOUNTER — Ambulatory Visit: Payer: PPO

## 2020-03-06 ENCOUNTER — Encounter: Payer: Self-pay | Admitting: Neurology

## 2020-03-06 ENCOUNTER — Ambulatory Visit: Payer: PPO | Admitting: Neurology

## 2020-03-06 VITALS — BP 102/64 | HR 72 | Ht 71.0 in | Wt 139.0 lb

## 2020-03-06 DIAGNOSIS — R269 Unspecified abnormalities of gait and mobility: Secondary | ICD-10-CM

## 2020-03-06 DIAGNOSIS — I63413 Cerebral infarction due to embolism of bilateral middle cerebral arteries: Secondary | ICD-10-CM | POA: Diagnosis not present

## 2020-03-06 DIAGNOSIS — W19XXXA Unspecified fall, initial encounter: Secondary | ICD-10-CM

## 2020-03-06 NOTE — Progress Notes (Signed)
[facility] [address] [place]. [place] [address] [phone]       OFFICE FOLLOW-UP NOTE  [name] Date of Birth:  1951-04-16 Medical Record Number:  [id number]   HPI: [name]. Travis Salinas is a 69 year old Caucasian male seen today for initial office follow-up visit following hospital consultation for stroke in April 2021.  He has past medical history of hypertension, hyperlipidemia, hepatitis C, degenerative cervical spine disease, COPD who presented on 01/01/2020 with sudden onset of gait instability, several falls, left-sided numbness and bilateral lower extremity weakness.  He was given IV TPA uneventfully.  Urine drug screen was positive for cocaine.  CT scan of the head showed no acute abnormality but MRI scan showed by cerebral embolic infarcts involving bilateral ACA, left MCA and bilateral cerebellum.  MRI of the brain showed no large vessel stenosis or occlusion and carotid ultrasound showed no significant extracranial stenosis.  2D echo showed diminished ejection fraction of 35 to 40% but no definite embolus.  LDL cholesterol was 87 mg percent and hemoglobin A1c was 5.9.  Cardiology was consulted for Takotsubo like pattern on his echo and recommended addition of coverage and ARB.  Patient was transferred to inpatient rehab.  He states he did well and was discharged home after week.  He was doing quite well until last week when he fell going down the steps and landed on his forehead and he has an abrasion.  He did not lose consciousness.  But since then his balance has been off.  He has to walk slowly to avoid falls.  Is currently participating in outpatient therapy and walks with a walker long distances.  His balance he feels is still not good.  His blood pressures well controlled and today it is 102/64.  He is tolerating Lipitor well without muscle aches and pains.  Is tolerating aspirin and Plavix but does bruise easily.  He states he has quit cocaine and drugs.   He was having daily headaches but since the addition of Topamax it seems to be working quite well and his headaches are not as frequent.  He is only taking 25 mg daily.  ROS:   14 system review of systems is positive for fall, imbalance, bruising, hearing loss, gait difficulty and all other systems negative  PMH:  Past Medical History:  Diagnosis Date  . Arthritis   . Cervical disc herniation   . COPD (chronic obstructive pulmonary disease) ([place])   . Headache    " due to antibiotics"  . Hepatitis C   . Hypertension     Social History:  Social History   Socioeconomic History  . Marital status: Married    Spouse name: Not on file  . Number of children: Not on file  . Years of education: Not on file  . Highest education level: Not on file  Occupational History  . Not on file  Tobacco Use  . Smoking status: Former Smoker    Packs/day: 1.00    Years: 32.00    Pack years: 32.00    Types: Cigarettes    Quit date: 2002    Years since quitting: 19.5  . Smokeless tobacco: Never Used  Vaping Use  . Vaping Use: Never used  Substance and Sexual Activity  . Alcohol use: No    Alcohol/week: 0.0 standard drinks  . Drug use: Not Currently    Types: Heroin, Cocaine    Comment: " relapsed once this year " 2018  . Sexual  activity: Not on file  Other Topics Concern  . Not on file  Social History Narrative   Lives with roommate In [place]    Married, lives separate from wife   Right handed   Caffeine: occasionally coffee and coke    Social Determinants of Health   Financial Resource Strain:   . Difficulty of Paying Living Expenses:   Food Insecurity:   . Worried About Charity fundraiser in the Last Year:   . Arboriculturist in the Last Year:   Transportation Needs:   . Film/video editor (Medical):   Marland Kitchen Lack of Transportation (Non-Medical):   Physical Activity:   . Days of Exercise per Week:   . Minutes of Exercise per Session:   Stress:   . Feeling of Stress :     Social Connections:   . Frequency of Communication with Friends and Family:   . Frequency of Social Gatherings with Friends and Family:   . Attends Religious Services:   . Active Member of Clubs or Organizations:   . Attends Archivist Meetings:   Marland Kitchen Marital Status:   Intimate Partner Violence:   . Fear of Current or Ex-Partner:   . Emotionally Abused:   Marland Kitchen Physically Abused:   . Sexually Abused:     Medications:   Current Outpatient Medications on File Prior to Visit  Medication Sig Dispense Refill  . acetaminophen (TYLENOL) 325 MG tablet Take 2 tablets (650 mg total) by mouth every 4 (four) hours as needed for mild pain (or temp > 37.5 C (99.5 F)).    Marland Kitchen albuterol (VENTOLIN HFA) 108 (90 Base) MCG/ACT inhaler Inhale 2 puffs into the lungs every 6 (six) hours as needed for wheezing or shortness of breath. 6.7 g 0  . amiodarone (PACERONE) 200 MG tablet Take 1 tablet (200 mg total) by mouth daily. 90 tablet 2  . ARIPiprazole (ABILIFY) 10 MG tablet Take 1 tablet (10 mg total) by mouth daily.    Marland Kitchen aspirin EC 81 MG EC tablet Take 1 tablet (81 mg total) by mouth daily.    Marland Kitchen atorvastatin (LIPITOR) 40 MG tablet Take 1 tablet (40 mg total) by mouth daily. 90 tablet 3  . carvedilol (COREG) 3.125 MG tablet Take 1 tablet (3.125 mg total) by mouth 2 (two) times daily with a meal. 180 tablet 3  . clonazePAM (KLONOPIN) 1 MG tablet Take 1 tablet (1 mg total) by mouth 2 (two) times daily. 30 tablet 0  . FLUoxetine (PROZAC) 10 MG tablet Take 1 tablet (10 mg total) by mouth daily. 30 tablet 3  . lamoTRIgine (LAMICTAL) 200 MG tablet Take 1 tablet (200 mg total) by mouth every morning. 30 tablet 0  . losartan (COZAAR) 50 MG tablet Take 1 tablet (50 mg total) by mouth daily. 90 tablet 3  . Multiple Vitamins-Minerals (MULTIVITAMIN WITH MINERALS) tablet Take 1 tablet by mouth daily.    Marland Kitchen oxyCODONE (OXYCONTIN) 20 mg 12 hr tablet Take 1 tablet (20 mg total) by mouth every 12 (twelve) hours. 14 tablet 0   . Oxymetazoline HCl (AFRIN NASAL SPRAY NA) Place into the nose.    . polyethylene glycol (MIRALAX / GLYCOLAX) 17 g packet Take 17 g by mouth 2 (two) times daily. (Patient taking differently: Take 17 g by mouth as needed. ) 14 each 0  . silodosin (RAPAFLO) 8 MG CAPS capsule Take 1 capsule (8 mg total) by mouth daily with breakfast. 30 capsule 0  . topiramate (TOPAMAX)  25 MG tablet Take 1 tablet (25 mg total) by mouth 2 (two) times daily. (Patient taking differently: Take 25 mg by mouth daily. ) 60 tablet 0  . umeclidinium-vilanterol (ANORO ELLIPTA) 62.5-25 MCG/INH AEPB Inhale 1 puff into the lungs daily. 60 each 3   No current facility-administered medications on file prior to visit.    Allergies:   Allergies  Allergen Reactions  . Propoxyphene Nausea And Vomiting  . Amoxicillin Nausea And Vomiting  . Ciprofloxacin Nausea Only  . Depakote [Divalproex Sodium] Other (See Comments)    hallucinations    Physical Exam General: Frail middle-age Caucasian male , seated, in no evident distress Head: head normocephalic and atraumatic.  Abrasion over right forehead from recent fall. Neck: supple with no carotid or supraclavicular bruits Cardiovascular: regular rate and rhythm, no murmurs Musculoskeletal: no deformity Skin:  no rash but scattered petichiae Vascular:  Normal pulses all extremities Vitals:   03/06/20 1546  BP: 102/64  Pulse: 72   Neurologic Exam Mental Status: Awake and fully alert. Oriented to place and time. Recent and remote memory intact. Attention span, concentration and fund of knowledge appropriate. Mood and affect appropriate.  Cranial Nerves: Fundoscopic exam reveals sharp disc margins. Pupils equal, briskly reactive to light. Extraocular movements full without nystagmus. Visual fields full to confrontation. Hearing diminished bilaterally.. Facial sensation intact.  Mild left lower facial asymmetry., tongue, palate moves normally and symmetrically.  Motor: Normal bulk  and tone. Normal strength in all tested extremity muscles.  Diminished fine finger movements on the left.  Orbits right over left upper extremity. Sensory.: intact to touch ,pinprick .position and vibratory sensation.  Coordination: Rapid alternating movements normal in all extremities. Finger-to-nose and heel-to-shin performed accurately bilaterally. Gait and Station: Arises from chair without difficulty. Stance is broad-based gait demonstrates mild ataxia with leaning to the left.  Unable to walk tandem  reflexes: 1+ and symmetric. Toes downgoing.   NIHSS  1 Modified Rankin  3   ASSESSMENT: 69 year old Caucasian male with by cerebral embolic infarcts in April 2021 secondary to cocaine related cardiomyopathy and vasculitis.  Multiple vascular risk factors of hypertension, hyperlipidemia, cocaine abuse.  He has daily headaches which appear to have responded quite well to Topamax.  New worsening of gait ataxia following a closed head injury last week.     PLAN: I had a long discussion with the patient and his wife regarding his recent by cerebral embolic strokes likely related to cocaine cardiomyopathy vasculitis and discuss results of hospital evaluation studies.  He also had a recent fall last week and had some worsening balance issues hence recommend checking a CT scan of the head with and without contrast to rule out any vascular lesion in the brain.  He was advised to discontinue Plavix as it has been more than 3 weeks and is having bruising and continue aspirin alone for stroke prevention.  Maintain aggressive risk factor modification with strict control of hypertension with blood pressure goal below 130/90, lipids with LDL cholesterol goal below 70 mg percent and diabetes with hemoglobin A1c goal below 6.5%.  He was encouraged to continue ongoing physical and occupational therapy to improve his gait and balance.  I counseled him to quit cocaine and smoking cigarettes.  We also discussed fall  safety prevention precautions.  He will continue on Topamax and the current dose of 25 mg daily which seems to be helping his headaches for now.  He will return for follow-up in the future in 3 months with my  nurse practitioner Janett Billow or call earlier if necessary. Greater than 50% of time during this 35 minute visit was spent on counseling,explanation of diagnosis of embolic strokes, cocaine abuse, planning of further management, discussion with patient and family and coordination of care Antony Contras, MD  [facility] [address] [place], [place] [address]  Phone [phone] Fax [phone] Note: This document was prepared with digital dictation and possible smart phrase technology. Any transcriptional errors that result from this process are unintentional

## 2020-03-06 NOTE — Patient Instructions (Addendum)
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  He also had a recent fall last week and had some worsening balance issues hence recommend checking a CT scan of the head with and without contrast to rule out any vascular lesion in the brain.  He was advised to discontinue Plavix as it has been more than 3 weeks and is having bruising and continue aspirin alone for stroke prevention.  Maintain aggressive risk factor modification with strict control of hypertension with blood pressure goal below 130/90, lipids with LDL cholesterol goal below 70 mg percent and diabetes with hemoglobin A1c goal below 6.5%.  He was encouraged to continue ongoing physical and occupational therapy to improve his gait and balance.  I counseled him to quit cocaine and smoking cigarettes.  We also discussed fall safety prevention precautions.  He will continue on Topamax and the current dose of 25 mg daily which seems to be helping his headaches for now.  He will return for follow-up in the future in 3 months with my nurse practitioner Janett Billow or call earlier if necessary.   Fall Prevention in the Home, Adult Falls can cause injuries. They can happen to people of all ages. There are many things you can do to make your home safe and to help prevent falls. Ask for help when making these changes, if needed. What actions can I take to prevent falls? General Instructions  Use good lighting in all rooms. Replace any light bulbs that burn out.  Turn on the lights when you go into a dark area. Use night-lights.  Keep items that you use often in easy-to-reach places. Lower the shelves around your home if necessary.  Set up your furniture so you have a clear path. Avoid moving your furniture around.  Do not have throw rugs and other things on the floor that can make you  trip.  Avoid walking on wet floors.  If any of your floors are uneven, fix them.  Add color or contrast paint or tape to clearly mark and help you see: ? Any grab bars or handrails. ? First and last steps of stairways. ? Where the edge of each step is.  If you use a stepladder: ? Make sure that it is fully opened. Do not climb a closed stepladder. ? Make sure that both sides of the stepladder are locked into place. ? Ask someone to hold the stepladder for you while you use it.  If there are any pets around you, be aware of where they are. What can I do in the bathroom?      Keep the floor dry. Clean up any water that spills onto the floor as soon as it happens.  Remove soap buildup in the tub or shower regularly.  Use non-skid mats or decals on the floor of the tub or shower.  Attach bath mats securely with double-sided, non-slip rug tape.  If you need to sit down in the shower, use a plastic, non-slip stool.  Install grab bars by the toilet and in the tub and shower. Do not use towel bars as grab bars. What can I do in the bedroom?  Make sure that you have a light by your bed that is easy to reach.  Do not use any sheets or blankets that are too big for your bed. They should not hang down onto  the floor.  Have a firm chair that has side arms. You can use this for support while you get dressed. What can I do in the kitchen?  Clean up any spills right away.  If you need to reach something above you, use a strong step stool that has a grab bar.  Keep electrical cords out of the way.  Do not use floor polish or wax that makes floors slippery. If you must use wax, use non-skid floor wax. What can I do with my stairs?  Do not leave any items on the stairs.  Make sure that you have a light switch at the top of the stairs and the bottom of the stairs. If you do not have them, ask someone to add them for you.  Make sure that there are handrails on both sides of the  stairs, and use them. Fix handrails that are broken or loose. Make sure that handrails are as long as the stairways.  Install non-slip stair treads on all stairs in your home.  Avoid having throw rugs at the top or bottom of the stairs. If you do have throw rugs, attach them to the floor with carpet tape.  Choose a carpet that does not hide the edge of the steps on the stairway.  Check any carpeting to make sure that it is firmly attached to the stairs. Fix any carpet that is loose or worn. What can I do on the outside of my home?  Use bright outdoor lighting.  Regularly fix the edges of walkways and driveways and fix any cracks.  Remove anything that might make you trip as you walk through a door, such as a raised step or threshold.  Trim any bushes or trees on the path to your home.  Regularly check to see if handrails are loose or broken. Make sure that both sides of any steps have handrails.  Install guardrails along the edges of any raised decks and porches.  Clear walking paths of anything that might make someone trip, such as tools or rocks.  Have any leaves, snow, or ice cleared regularly.  Use sand or salt on walking paths during winter.  Clean up any spills in your garage right away. This includes grease or oil spills. What other actions can I take?  Wear shoes that: ? Have a low heel. Do not wear high heels. ? Have rubber bottoms. ? Are comfortable and fit you well. ? Are closed at the toe. Do not wear open-toe sandals.  Use tools that help you move around (mobility aids) if they are needed. These include: ? Canes. ? Walkers. ? Scooters. ? Crutches.  Review your medicines with your doctor. Some medicines can make you feel dizzy. This can increase your chance of falling. Ask your doctor what other things you can do to help prevent falls. Where to find more information  Centers for Disease Control and Prevention, STEADI: https://garcia.biz/  [employer]  on Aging: BrainJudge.co.uk Contact a doctor if:  You are afraid of falling at home.  You feel weak, drowsy, or dizzy at home.  You fall at home. Summary  There are many simple things that you can do to make your home safe and to help prevent falls.  Ways to make your home safe include removing tripping hazards and installing grab bars in the bathroom.  Ask for help when making these changes in your home. This information is not intended to replace advice given to you by your health  care provider. Make sure you discuss any questions you have with your health care provider. Document Revised: 12/17/2018 Document Reviewed: 04/10/2017 Elsevier Patient Education  [place].  Stroke Prevention Some medical conditions and behaviors are associated with a higher chance of having a stroke. You can help prevent a stroke by making nutrition, lifestyle, and other changes, including managing any medical conditions you may have. What nutrition changes can be made?   Eat healthy foods. You can do this by: ? Choosing foods high in fiber, such as fresh fruits and vegetables and whole grains. ? Eating at least 5 or more servings of fruits and vegetables a day. Try to fill half of your plate at each meal with fruits and vegetables. ? Choosing lean protein foods, such as lean cuts of meat, poultry without skin, fish, tofu, beans, and nuts. ? Eating low-fat dairy products. ? Avoiding foods that are high in salt (sodium). This can help lower blood pressure. ? Avoiding foods that have saturated fat, trans fat, and cholesterol. This can help prevent high cholesterol. ? Avoiding processed and premade foods.  Follow your health care provider's specific guidelines for losing weight, controlling high blood pressure (hypertension), lowering high cholesterol, and managing diabetes. These may include: ? Reducing your daily calorie intake. ? Limiting your daily sodium intake to 1,500 milligrams  (mg). ? Using only healthy fats for cooking, such as olive oil, canola oil, or sunflower oil. ? Counting your daily carbohydrate intake. What lifestyle changes can be made?  Maintain a healthy weight. Talk to your health care provider about your ideal weight.  Get at least 30 minutes of moderate physical activity at least 5 days a week. Moderate activity includes brisk walking, biking, and swimming.  Do not use any products that contain nicotine or tobacco, such as cigarettes and e-cigarettes. If you need help quitting, ask your health care provider. It may also be helpful to avoid exposure to secondhand smoke.  Limit alcohol intake to no more than 1 drink a day for nonpregnant women and 2 drinks a day for men. One drink equals 12 oz of beer, 5 oz of wine, or 1 oz of hard liquor.  Stop any illegal drug use.  Avoid taking birth control pills. Talk to your health care provider about the risks of taking birth control pills if: ? You are over 67 years old. ? You smoke. ? You get migraines. ? You have ever had a blood clot. What other changes can be made?  Manage your cholesterol levels. ? Eating a healthy diet is important for preventing high cholesterol. If cholesterol cannot be managed through diet alone, you may also need to take medicines. ? Take any prescribed medicines to control your cholesterol as told by your health care provider.  Manage your diabetes. ? Eating a healthy diet and exercising regularly are important parts of managing your blood sugar. If your blood sugar cannot be managed through diet and exercise, you may need to take medicines. ? Take any prescribed medicines to control your diabetes as told by your health care provider.  Control your hypertension. ? To reduce your risk of stroke, try to keep your blood pressure below 130/80. ? Eating a healthy diet and exercising regularly are an important part of controlling your blood pressure. If your blood pressure cannot  be managed through diet and exercise, you may need to take medicines. ? Take any prescribed medicines to control hypertension as told by your health care provider. ? Ask your  health care provider if you should monitor your blood pressure at home. ? Have your blood pressure checked every year, even if your blood pressure is normal. Blood pressure increases with age and some medical conditions.  Get evaluated for sleep disorders (sleep apnea). Talk to your health care provider about getting a sleep evaluation if you snore a lot or have excessive sleepiness.  Take over-the-counter and prescription medicines only as told by your health care provider. Aspirin or blood thinners (antiplatelets or anticoagulants) may be recommended to reduce your risk of forming blood clots that can lead to stroke.  Make sure that any other medical conditions you have, such as atrial fibrillation or atherosclerosis, are managed. What are the warning signs of a stroke? The warning signs of a stroke can be easily remembered as BEFAST.  B is for balance. Signs include: ? Dizziness. ? Loss of balance or coordination. ? Sudden trouble walking.  E is for eyes. Signs include: ? A sudden change in vision. ? Trouble seeing.  F is for face. Signs include: ? Sudden weakness or numbness of the face. ? The face or eyelid drooping to one side.  A is for arms. Signs include: ? Sudden weakness or numbness of the arm, usually on one side of the body.  S is for speech. Signs include: ? Trouble speaking (aphasia). ? Trouble understanding.  T is for time. ? These symptoms may represent a serious problem that is an emergency. Do not wait to see if the symptoms will go away. Get medical help right away. Call your local emergency services (911 in the [place] Do not drive yourself to the hospital.  Other signs of stroke may include: ? A sudden, severe headache with no known cause. ? Nausea or vomiting. ? Seizure. Where to  find more information For more information, visit:  American Stroke Association: www.strokeassociation.org  National Stroke Association: www.stroke.org Summary  You can prevent a stroke by eating healthy, exercising, not smoking, limiting alcohol intake, and managing any medical conditions you may have.  Do not use any products that contain nicotine or tobacco, such as cigarettes and e-cigarettes. If you need help quitting, ask your health care provider. It may also be helpful to avoid exposure to secondhand smoke.  Remember BEFAST for warning signs of stroke. Get help right away if you or a loved one has any of these signs. This information is not intended to replace advice given to you by your health care provider. Make sure you discuss any questions you have with your health care provider. Document Revised: 08/08/2017 Document Reviewed: 10/01/2016 Elsevier Patient Education  2020 [employer].

## 2020-03-07 ENCOUNTER — Telehealth: Payer: Self-pay | Admitting: Neurology

## 2020-03-07 NOTE — Telephone Encounter (Signed)
health team order sent to GI. No auth they will reach out to the patient to schedule.  

## 2020-03-09 ENCOUNTER — Other Ambulatory Visit: Payer: Self-pay

## 2020-03-09 ENCOUNTER — Ambulatory Visit: Payer: PPO | Attending: Physician Assistant

## 2020-03-09 DIAGNOSIS — R2681 Unsteadiness on feet: Secondary | ICD-10-CM | POA: Insufficient documentation

## 2020-03-09 DIAGNOSIS — I6310 Cerebral infarction due to embolism of unspecified precerebral artery: Secondary | ICD-10-CM | POA: Diagnosis not present

## 2020-03-09 NOTE — Therapy (Signed)
[facility] [address] [place], [place], [address] Phone: [phone]   Fax:  [phone]  Physical Therapy Therapy Re-certification  Patient Details  Name: Travis Salinas MRN: [id number] Date of Birth: 1951/09/02 Referring Provider (PT): [place]   Encounter Date: 03/09/2020   PT End of Session - 03/09/20 1550    Visit Number 4    Number of Visits 6    Date for PT Re-Evaluation 03/16/20    PT Start Time 5852    PT Stop Time 1530    PT Time Calculation (min) 45 min    Equipment Utilized During Treatment Gait belt    Activity Tolerance Patient tolerated treatment well    Behavior During Therapy WFL for tasks assessed/performed           Past Medical History:  Diagnosis Date   Arthritis    Cervical disc herniation    COPD (chronic obstructive pulmonary disease) ([facility])    Headache    " due to antibiotics"   Hepatitis C    Hypertension     Past Surgical History:  Procedure Laterality Date   APPLICATION OF A-CELL OF EXTREMITY Left 12/05/2016   Procedure: APPLICATION OF A-CELL OF EXTREMITY;  Surgeon: Loel Lofty Dillingham, DO;  Location: [facility];  Service: Plastics;  Laterality: Left;   APPLICATION OF WOUND VAC Left 12/05/2016   Procedure: APPLICATION OF WOUND VAC;  Surgeon: Loel Lofty Dillingham, DO;  Location: [facility];  Service: Plastics;  Laterality: Left;   I & D EXTREMITY Left 11/03/2016   Procedure: IRRIGATION AND DEBRIDEMENT EXTREMITY;  Surgeon: Leandrew Koyanagi, MD;  Location: [facility];  Service: Orthopedics;  Laterality: Left;   I & D EXTREMITY Left 12/05/2016   Procedure: IRRIGATION AND DEBRIDEMENT EXTREMITY;  Surgeon: Loel Lofty Dillingham, DO;  Location: [facility];  Service: Plastics;  Laterality: Left;   INCISION AND DRAINAGE ABSCESS Right 11/12/2013   Procedure: INCISION AND DRAINAGE AND OPEN PACKING OF RIGHT CALF  ABSCESS;  Surgeon: Earnstine Regal, MD;  Location: [facility];  Service: General;  Laterality: Right;     INCISION AND DRAINAGE OF WOUND Left 01/29/2017   Procedure: IRRIGATION AND DEBRIDEMENT OF LEFT LEG WOUND WITH ABRA PLACEMENT AND PLACEMENT OF WOUND VAC;  Surgeon: Wallace Going, DO;  Location: [facility];  Service: Plastics;  Laterality: Left;   KNEE SURGERY      There were no vitals filed for this visit.   Subjective Assessment - 03/09/20 1445    Subjective fell over a week ago, while coming down steps, he fell before the last step. Patien can't remember how the fall occured. Pt reports he stumbled and avoided concrete and fell on the grass. Pt has scrap in eye and R hand.    Patient is accompained by: Family member    Limitations Walking;Standing    Diagnostic tests IMPRESSION:Several small acute infarcts involving multiple vascular territoriesbilaterally suggesting a central source. No proximal intracranialvessel occlusion or significant stenosis. Chronic microvascular ischemic changes.    Patient Stated Goals get better              [facility] PT Assessment - 03/09/20 1503      Standardized Balance Assessment   Standardized Balance Assessment Five Times Sit to Stand;Berg Balance Test    Five times sit to stand comments  8.30      Berg Balance Test   Sit to Stand Able to stand without using hands and stabilize independently    Standing  Unsupported Able to stand safely 2 minutes    Sitting with Back Unsupported but Feet Supported on Floor or Stool Able to sit safely and securely 2 minutes    Stand to Sit Sits safely with minimal use of hands    Transfers Able to transfer safely, minor use of hands    Standing Unsupported with Eyes Closed Able to stand 10 seconds safely    Standing Unsupported with Feet Together Able to place feet together independently and stand 1 minute safely    From Standing, Reach Forward with Outstretched Arm Can reach confidently >25 cm (10")    From Standing Position, Pick up Object from Floor Able to pick up shoe safely and easily    From Standing  Position, Turn to Look Behind Over each Shoulder Looks behind from both sides and weight shifts well    Turn 360 Degrees Able to turn 360 degrees safely in 4 seconds or less    Standing Unsupported, Alternately Place Feet on Step/Stool Able to stand independently and safely and complete 8 steps in 20 seconds    Standing Unsupported, One Foot in Front Able to place foot tandem independently and hold 30 seconds    Standing on One Leg Able to lift leg independently and hold 5-10 seconds    Total Score 55               Assessed BBS: 55/56  SLS: 16 seconds on L LE, 5 sec on R LE  Practiced going up and down stairs to see how patient will remember to use the rail. First time patient did not use rail. Pt was asked why he did not use the rail, pt reported, "I thought I could do it without". Pt was cued to improve safety, he should always use the rail. Pt also did not look down to improve visual feedback like we suggested last session. Patient was cued to wait 2 sec when he comes down the steps before he starts walking and same when he is at top of the steps. He was asked to come down stairs and go to a chair to see if he remember to wait 2 sec. Pt did not and needed cueing to wait 2 sec before he started walking after coming down the steps.   We practiced above activities randomly thoruhout the session to see how much patient remember. Patient needs moderate to max reminders to do it correctly. Patient was educated on slowly down  A little when he moves to improve his safety.                     PT Long Term Goals - 02/25/20 1239      PT LONG TERM GOAL #1   Title Pt will demonstrate improved visual acuity when stepping over, around obstacles to improve safety awareness.    Baseline Pt looks straight ahead, increased risk for tripping. needs 50% cueing    Time 1    Period Weeks    Status On-going      PT LONG TERM GOAL #2   Title Patient will requre <10% cueing to improve  safety awareness and slowing down when performing high level activities such as stepping up and down, stepping over obstacles, stepping around obstacles to improve safety and reduce fall risk.    Baseline requires 50% cueing (02/25/20)    Time 4    Period Weeks    Status Revised  Plan - 03/09/20 1543    Clinical Impression Statement Today's session was focused on re-evaluating his balance and improving safety judgement to reduce fall risk. Patient demonstrated WNL of balance with SLS, tandem stance, Berg balance scale and WNL of functional mobility and strength with 5x sit to stand. Patient lacks proper safety judgement which is resulting into having multiple falls over last several months. Patient tends to move fast when changing positions or walking and turning. Patient was educated to wait 2 sec when he starts walking after standing up and waiting 2 sec when he comes down the last step on stairs or when he is at top of the steps before he starts walking. Patient has decreased hearing and during the session, we asked patient to perform "repeat back" to assure patient understood. Patient will benefit from continued skilled PT to improve safety judgement to reduce falls and improve safety.    Personal Factors and Comorbidities Time since onset of injury/illness/exacerbation    Examination-Activity Limitations Stairs    Stability/Clinical Decision Making Stable/Uncomplicated    Rehab Potential Excellent    PT Frequency 1x / week   1x week to 2-3x/month   PT Duration 8 weeks   1 week   PT Treatment/Interventions Patient/family education;Gait training;Stair training;Functional mobility training;Therapeutic activities;Therapeutic exercise;Balance training;Neuromuscular re-education    PT Next Visit Plan work on safety judgement with transfers, walking, 2 sec wait rule before moving,    PT Home Exercise Plan Walking program for 30 min /day    Consulted and Agree with Plan of Care  Patient;Family member/caregiver    Family Member Ashley           Patient will benefit from skilled therapeutic intervention in order to improve the following deficits and impairments:  Decreased safety awareness  Visit Diagnosis: 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) 01/06/2020   Acute renal failure ([facility])    Left leg weakness    Ventricular tachyarrhythmia ([facility])    Stroke (cerebrum) ([facility]) 01/01/2020   Normocytic anemia 06/04/2017   COPD (chronic obstructive pulmonary disease) ([facility]) 06/04/2017   Pneumococcal bacteremia 05/28/2017   Pneumococcal pneumonia ([facility]) 05/27/2017   IVDU (intravenous drug user)    Liver fibrosis 04/24/2016   Chronic hepatitis C without hepatic coma ([facility]) 04/19/2015   Substance abuse ([facility]) 04/19/2015   HTN (hypertension) 11/12/2013   Depression 11/12/2013    Kerrie Pleasure 03/09/2020, 6:50 PM  [facility] [address] [place], [place], [address] Phone: [phone]   Fax:  [phone]  Name: Travis Salinas MRN: [id number] Date of Birth: 1951-05-22

## 2020-03-10 ENCOUNTER — Ambulatory Visit
Admission: RE | Admit: 2020-03-10 | Discharge: 2020-03-10 | Disposition: A | Discharge status: Home / Self Care | Payer: PPO | Source: Ambulatory Visit | Attending: Neurology | Admitting: Neurology

## 2020-03-10 DIAGNOSIS — W19XXXA Unspecified fall, initial encounter: Secondary | ICD-10-CM

## 2020-03-10 DIAGNOSIS — S0990XA Unspecified injury of head, initial encounter: Secondary | ICD-10-CM | POA: Diagnosis not present

## 2020-03-10 DIAGNOSIS — R269 Unspecified abnormalities of gait and mobility: Secondary | ICD-10-CM

## 2020-03-10 MED ORDER — IOPAMIDOL (ISOVUE-300) INJECTION 61%
75.0000 mL | Freq: Once | INTRAVENOUS | Status: AC | PRN
  Administered 2020-03-10: 75 mL via INTRAVENOUS

## 2020-03-16 ENCOUNTER — Other Ambulatory Visit: Payer: PPO

## 2020-03-16 ENCOUNTER — Ambulatory Visit: Payer: PPO

## 2020-03-16 ENCOUNTER — Other Ambulatory Visit: Payer: Self-pay

## 2020-03-16 DIAGNOSIS — I6310 Cerebral infarction due to embolism of unspecified precerebral artery: Secondary | ICD-10-CM | POA: Diagnosis not present

## 2020-03-16 DIAGNOSIS — R2681 Unsteadiness on feet: Secondary | ICD-10-CM

## 2020-03-16 NOTE — Therapy (Signed)
[facility] [address] [place], [place], [address] Phone: [phone]   Fax:  [phone]  Physical Therapy Discharge summary  Patient Details  Name: Travis Salinas MRN: [id number] Date of Birth: 18-Sep-1950 Referring Provider (PT): [place]   Encounter Date: 03/16/2020   PT End of Session - 03/16/20 1435    Visit Number 5    Number of Visits 6    Date for PT Re-Evaluation 03/16/20    PT Start Time 1400    PT Stop Time 1425    PT Time Calculation (min) 25 min    Equipment Utilized During Treatment Gait belt    Activity Tolerance Patient tolerated treatment well    Behavior During Therapy [facility] for tasks assessed/performed           Past Medical History:  Diagnosis Date  . Arthritis   . Cervical disc herniation   . COPD (chronic obstructive pulmonary disease) ([place])   . Headache    " due to antibiotics"  . Hepatitis C   . Hypertension     Past Surgical History:  Procedure Laterality Date  . APPLICATION OF A-CELL OF EXTREMITY Left 12/05/2016   Procedure: APPLICATION OF A-CELL OF EXTREMITY;  Surgeon: Loel Lofty Dillingham, DO;  Location: [facility];  Service: Plastics;  Laterality: Left;  . APPLICATION OF WOUND VAC Left 12/05/2016   Procedure: APPLICATION OF WOUND VAC;  Surgeon: Loel Lofty Dillingham, DO;  Location: [facility];  Service: Plastics;  Laterality: Left;  . I & D EXTREMITY Left 11/03/2016   Procedure: IRRIGATION AND DEBRIDEMENT EXTREMITY;  Surgeon: Leandrew Koyanagi, MD;  Location: [facility];  Service: Orthopedics;  Laterality: Left;  . I & D EXTREMITY Left 12/05/2016   Procedure: IRRIGATION AND DEBRIDEMENT EXTREMITY;  Surgeon: Loel Lofty Dillingham, DO;  Location: [facility];  Service: Plastics;  Laterality: Left;  . INCISION AND DRAINAGE ABSCESS Right 11/12/2013   Procedure: INCISION AND DRAINAGE AND OPEN PACKING OF RIGHT CALF  ABSCESS;  Surgeon: Earnstine Regal, MD;  Location: [facility];  Service: General;  Laterality: Right;  .  INCISION AND DRAINAGE OF WOUND Left 01/29/2017   Procedure: IRRIGATION AND DEBRIDEMENT OF LEFT LEG WOUND WITH ABRA PLACEMENT AND PLACEMENT OF WOUND VAC;  Surgeon: Wallace Going, DO;  Location: [facility];  Service: Plastics;  Laterality: Left;  . KNEE SURGERY      There were no vitals filed for this visit.   Subjective Assessment - 03/16/20 1358    Subjective Pt reports no new falls. He is not feeling too good today. He feels his balance is off today. He has been very mindful of not moving too quickly and it seems it is heping with his falls.    Patient is accompained by: Family member    Limitations Walking;Standing    Diagnostic tests IMPRESSION:Several small acute infarcts involving multiple vascular territoriesbilaterally suggesting a central source. No proximal intracranialvessel occlusion or significant stenosis. Chronic microvascular ischemic changes.    Patient Stated Goals get better               Had patient perform sit to stand transfers from diffeent chair and mat to observe his safety awareness and hand placement and whther he waits before he starts walking. Patient needed very little cueing today.  Had patient go up and down stairs: 12 steps with use of bil rail, pt demonstrated that he was looking down the whole time and turning slowly and holding on to the  rail until he got down on floor.  Obstacle course: 1 x 8" hurdle, 1 x 6" hurdle, 1 x 6" box: 2x, During first attempt, patient didn't look down when clearing the box and he didn't slow down. He was educated to look down, stop and slowly clear the obstacle without hitting it. During second trial, patient was much more confident and did not hit the hurdles when stepping over them.                           PT Long Term Goals - 03/16/20 1435      PT LONG TERM GOAL #1   Title Pt will demonstrate improved visual acuity when stepping over, around obstacles to improve safety awareness.    Baseline  Pt looks straight ahead, increased risk for tripping. needs 50% cueing    Time 1    Period Weeks    Status Achieved      PT LONG TERM GOAL #2   Title Patient will requre <10% cueing to improve safety awareness and slowing down when performing high level activities such as stepping up and down, stepping over obstacles, stepping around obstacles to improve safety and reduce fall risk.    Baseline requires 50% cueing (02/25/20)    Time 4    Period Weeks    Status Achieved                 Plan - 03/16/20 1439    Clinical Impression Statement Patient has been seen for total of 5 sessions from 01/19/20 to 03/16/20. Patient has progressed well. Patient has demonstrated overall improve safety awareness and judgement. Patient has functional balance and gait at this time. patient has reached maximum potential with skilled Physical therapy and will be discharged from skilled PT.    Examination-Activity Limitations Stairs    Clinical Decision Making Low    Rehab Potential Excellent    PT Treatment/Interventions Patient/family education;Gait training;Stair training;Functional mobility training;Therapeutic activities;Therapeutic exercise;Balance training;Neuromuscular re-education    PT Next Visit Plan D/C    PT Home Exercise Plan Walking program for 30 min /day    Consulted and Agree with Plan of Care Patient;Family member/caregiver           Patient will benefit from skilled therapeutic intervention in order to improve the following deficits and impairments:  Decreased safety awareness  Visit Diagnosis: 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 Chronic pain syndrome   . Hypoalbuminemia due to protein-calorie malnutrition ([place])   . Acute blood loss anemia   . Benign prostatic hyperplasia   . Vascular headache   . Acute on chronic combined systolic and diastolic heart  failure ([place])   . Sleep disturbance   . Embolic cerebral infarction ([place]) 01/06/2020  . Acute renal failure ([place])   . Left leg weakness   . Ventricular tachyarrhythmia ([place])   . Stroke (cerebrum) ([place]) 01/01/2020  . Normocytic anemia 06/04/2017  . COPD (chronic obstructive pulmonary disease) ([place]) 06/04/2017  . Pneumococcal bacteremia 05/28/2017  . Pneumococcal pneumonia ([place]) 05/27/2017  . IVDU (intravenous drug user)   . Liver fibrosis 04/24/2016  . Chronic hepatitis C without hepatic coma ([place]) 04/19/2015  . Substance abuse ([place]) 04/19/2015  . HTN (hypertension) 11/12/2013  . Depression 11/12/2013    Kerrie Pleasure, PT 03/16/2020, 2:41 PM  [facility]  [facility] [address] [place], [place], [address] Phone: [phone]   Fax:  [phone]  Name: Travis Salinas MRN: [id number] Date of Birth: [birth year]

## 2020-03-28 ENCOUNTER — Encounter: Payer: Self-pay | Admitting: *Deleted

## 2020-03-28 NOTE — Progress Notes (Signed)
Kindly inform the patient that MRI scan of the brain shows expected changes in the previous strokes and age-related changes of hardening of the arteries but no new or worrisome findings

## 2020-04-03 DIAGNOSIS — M545 Low back pain: Secondary | ICD-10-CM | POA: Diagnosis not present

## 2020-04-04 ENCOUNTER — Other Ambulatory Visit: Payer: Self-pay

## 2020-04-04 NOTE — Patient Outreach (Signed)
[facility]) Care Management  04/04/2020  Travis Salinas [birth year] [id number]   Telephone outreach to patient to obtain mRS was successfully completed. MRS=1  Ina [facility] Management Assistant [phone]

## 2020-04-13 DIAGNOSIS — M542 Cervicalgia: Secondary | ICD-10-CM | POA: Diagnosis not present

## 2020-04-19 ENCOUNTER — Other Ambulatory Visit: Payer: Self-pay

## 2020-04-19 ENCOUNTER — Ambulatory Visit (HOSPITAL_COMMUNITY): Payer: PPO | Attending: Cardiology

## 2020-04-19 DIAGNOSIS — I5043 Acute on chronic combined systolic (congestive) and diastolic (congestive) heart failure: Secondary | ICD-10-CM | POA: Insufficient documentation

## 2020-04-19 DIAGNOSIS — I5181 Takotsubo syndrome: Secondary | ICD-10-CM | POA: Diagnosis not present

## 2020-04-19 LAB — ECHOCARDIOGRAM COMPLETE
Area-P 1/2: 2.09 cm2
S' Lateral: 2.9 cm

## 2020-04-24 DIAGNOSIS — M542 Cervicalgia: Secondary | ICD-10-CM | POA: Diagnosis not present

## 2020-04-25 NOTE — Progress Notes (Signed)
Cardiology Office Note:    Date:  04/26/2020   ID:  Travis Salinas, DOB 1951-06-30, MRN [id number]  PCP:  Kathyrn Lass, MD  Cardiologist:  Mertie Moores, MD   Electrophysiologist:  None   Referring MD: Kathyrn Lass, MD   Chief Complaint:  Follow-up (Cardiomyopathy, atrial fibrillation)    Patient Profile:    Travis Salinas is a 69 y.o. male with:   Polysubstance abuse hx (cocaine, heroin)  COPD  Hep C  Hypertension   Hyperlipidemia   Chronic kidney disease   Hx of CVA 4/21 in setting of cocaine use; tx w tPA  WCT/Paroxysmal Atrial fibrillation   Amiodarone Rx  Tako-Tsubo CM; EF 35-40  Echocardiogram 8/21: EF 60-65, Gr 1 DD  Chronic kidney disease    Prior CV studies: Echocardiogram 04/19/20 EF 60-65, mild LVH, Gr 1 DD, normal RVSF, trivial MR  Carotid US 01/03/20 No sig ICA stenosis  Echocardiogram 01/02/20 EF 35-40, mod LVH, Gr 1 DD, normal RVSF, trivial MR, apical AK   Myoview 10/04/16 Apical thinning, normal perfusion, EF 58; low risk   History of Present Illness:    Travis Salinas was admitted in 12/2019 with an acute CVA in the setting of cocaine abuse.  He was tx with tPA.  He developed WCT / atrial fibrillation that converted with Amiodarone.  Echocardiogram demonstrated EF 35-40 and WMA c/w Tako-tsubo CM.  He was not started on anticoagulation and it was felt Amiodarone could be DC'd after 2-3 mos.  He was last seen by Dr. Acie Fredrickson in 01/2020.  A follow up echocardiogram showed improved LVF with EF 60-65.  He returns for follow up.   He is here with his wife.  He has chronic shortness of breath related to emphysema.  This is unchanged.  He has not had chest pain, syncope, near syncope, orthopnea, leg swelling.  He has not had any palpitations.  Past Medical History:  Diagnosis Date  . Arthritis   . Cervical disc herniation   . COPD (chronic obstructive pulmonary disease) ([place])   . Headache    " due to antibiotics"  . Hepatitis C   .  Hypertension     Current Medications: Current Meds  Medication Sig  . albuterol (VENTOLIN HFA) 108 (90 Base) MCG/ACT inhaler Inhale 2 puffs into the lungs every 6 (six) hours as needed for wheezing or shortness of breath.  Marland Kitchen amiodarone (PACERONE) 200 MG tablet Take 1 tablet (200 mg total) by mouth daily.  . ARIPiprazole (ABILIFY) 10 MG tablet Take 1 tablet (10 mg total) by mouth daily.  Marland Kitchen aspirin EC 81 MG EC tablet Take 1 tablet (81 mg total) by mouth daily.  Marland Kitchen atorvastatin (LIPITOR) 40 MG tablet Take 1 tablet (40 mg total) by mouth daily.  . carvedilol (COREG) 3.125 MG tablet Take 1 tablet (3.125 mg total) by mouth 2 (two) times daily with a meal.  . clonazePAM (KLONOPIN) 1 MG tablet Take 1 tablet (1 mg total) by mouth 2 (two) times daily.  Marland Kitchen FLUoxetine (PROZAC) 10 MG tablet Take 1 tablet (10 mg total) by mouth daily.  . fluticasone (FLONASE) 50 MCG/ACT nasal spray Place 2 sprays into both nostrils as needed for allergies or rhinitis.   Marland Kitchen lamoTRIgine (LAMICTAL) 200 MG tablet Take 1 tablet (200 mg total) by mouth every morning.  Marland Kitchen losartan (COZAAR) 50 MG tablet Take 1 tablet (50 mg total) by mouth daily.  . Multiple Vitamins-Minerals (MULTIVITAMIN WITH MINERALS) tablet Take 1 tablet by  mouth daily.  Marland Kitchen oxyCODONE (OXYCONTIN) 20 mg 12 hr tablet Take 1 tablet (20 mg total) by mouth every 12 (twelve) hours.  . polyethylene glycol (MIRALAX / GLYCOLAX) 17 g packet Take 17 g by mouth daily as needed.  . silodosin (RAPAFLO) 8 MG CAPS capsule Take 1 capsule (8 mg total) by mouth daily with breakfast.  . topiramate (TOPAMAX) 25 MG tablet Take 25 mg by mouth daily.  Marland Kitchen umeclidinium-vilanterol (ANORO ELLIPTA) 62.5-25 MCG/INH AEPB Inhale 1 puff into the lungs daily.     Allergies:   Propoxyphene, Amoxicillin, Ciprofloxacin, and Depakote [divalproex sodium]   Social History   Tobacco Use  . Smoking status: Former Smoker    Packs/day: 1.00    Years: 32.00    Pack years: 32.00    Types: Cigarettes     Quit date: 2002    Years since quitting: 19.6  . Smokeless tobacco: Never Used  Vaping Use  . Vaping Use: Never used  Substance Use Topics  . Alcohol use: No    Alcohol/week: 0.0 standard drinks  . Drug use: Not Currently    Types: Heroin, Cocaine    Comment: " relapsed once this year " 2018     Family Hx: The patient's family history includes Heart disease in his father and mother; Stroke in his father and mother.  ROS   EKGs/Labs/Other Test Reviewed:    EKG:  EKG is not ordered today.  The ekg ordered today demonstrates n/a  Recent Labs: 01/02/2020: Magnesium 1.9; TSH 1.235 01/07/2020: ALT 22; Hemoglobin 12.3; Platelets 220 02/09/2020: BUN 31; Creatinine, Ser 1.07; Potassium 4.1; Sodium 143   Recent Lipid Panel Lab Results  Component Value Date/Time   CHOL 151 01/02/2020 03:29 AM   TRIG 97 01/02/2020 03:29 AM   HDL 45 01/02/2020 03:29 AM   CHOLHDL 3.4 01/02/2020 03:29 AM   LDLCALC 87 01/02/2020 03:29 AM    Physical Exam:    VS:  BP 140/62   Pulse 66   Ht 5\' 11"  (1.803 m)   Wt 146 lb 3.2 oz (66.3 kg)   SpO2 96%   BMI 20.39 kg/m     Wt Readings from Last 3 Encounters:  04/26/20 146 lb 3.2 oz (66.3 kg)  03/06/20 139 lb (63 kg)  02/03/20 145 lb 3.2 oz (65.9 kg)     Constitutional:      Appearance: Healthy appearance. Not in distress.  Pulmonary:     Effort: Pulmonary effort is normal.     Breath sounds: No wheezing. No rales.  Cardiovascular:     Normal rate. Regular rhythm. Normal S1. Normal S2.     Murmurs: There is no murmur.  Edema:    Peripheral edema absent.  Abdominal:     Palpations: Abdomen is soft.  Skin:    General: Skin is warm and dry.  Neurological:     General: No focal deficit present.     Mental Status: Alert and oriented to person, place and time.     Cranial Nerves: Cranial nerves are intact.      ASSESSMENT & PLAN:    1. Takotsubo cardiomyopathy EF during his hospitalization for stroke in April 2021 was 35-40% with apical  ballooning.  Most recent echocardiogram 04/19/2020 demonstrates EF 60-65 with mild diastolic dysfunction.  He is overall doing well without signs of volume excess.  Continue beta-blocker, ARB.  2. Paroxysmal atrial fibrillation (HCC) He had wide-complex tachycardia during his admission for his stroke that appeared to be atrial fibrillation with  aberrancy.  He seems to be maintaining sinus rhythm.  The plan is to eventually get him off of amiodarone.  Follow-up with Dr. Acie Fredrickson in 3 months.  He will be more than 6 months out at that point.  I will review with Dr. Acie Fredrickson to see if we need to stop his amiodarone sooner.  3. History of CVA (cerebrovascular accident) No residual effects.  He remains on aspirin.  Continue follow-up with neurology as planned.  4. Essential hypertension Blood pressure above target.  I have asked him to monitor his blood pressure over the next week and send those readings to me through my chart for review.  If his blood pressure remains >130/80, I will adjust his losartan.    Dispo:  Return in about 3 months (around 07/27/2020) for Routine Follow Up, w/ Dr. Acie Fredrickson, in person.   Medication Adjustments/Labs and Tests Ordered: Current medicines are reviewed at length with the patient today.  Concerns regarding medicines are outlined above.  Tests Ordered: No orders of the defined types were placed in this encounter.  Medication Changes: No orders of the defined types were placed in this encounter.   Signed, Richardson Dopp, PA-C  04/26/2020 10:40 AM    [facility] [place], [place], [place]  [address] Phone: [phone]; Fax: [phone]

## 2020-04-26 ENCOUNTER — Other Ambulatory Visit: Payer: Self-pay

## 2020-04-26 ENCOUNTER — Encounter: Payer: Self-pay | Admitting: Physician Assistant

## 2020-04-26 ENCOUNTER — Ambulatory Visit: Payer: PPO | Admitting: Physician Assistant

## 2020-04-26 VITALS — BP 140/62 | HR 66 | Ht 71.0 in | Wt 146.2 lb

## 2020-04-26 DIAGNOSIS — Z8673 Personal history of transient ischemic attack (TIA), and cerebral infarction without residual deficits: Secondary | ICD-10-CM

## 2020-04-26 DIAGNOSIS — I5181 Takotsubo syndrome: Secondary | ICD-10-CM

## 2020-04-26 DIAGNOSIS — I480 Paroxysmal atrial fibrillation: Secondary | ICD-10-CM

## 2020-04-26 DIAGNOSIS — I10 Essential (primary) hypertension: Secondary | ICD-10-CM | POA: Diagnosis not present

## 2020-04-26 NOTE — Patient Instructions (Addendum)
Medication Instructions:  Your physician recommends that you continue on your current medications as directed. Please refer to the Current Medication list given to you today.  *If you need a refill on your cardiac medications before your next appointment, please call your pharmacy*  Lab Work: None ordered today  Testing/Procedures: None ordered today  Follow-Up: On 07/28/20 10:00AM with Mertie Moores, MD   Other Instructions Check your blood pressure once a day for a week and send through my chart message.

## 2020-05-09 ENCOUNTER — Other Ambulatory Visit: Payer: Self-pay

## 2020-05-09 ENCOUNTER — Ambulatory Visit: Payer: PPO | Admitting: Internal Medicine

## 2020-05-09 ENCOUNTER — Encounter: Payer: Self-pay | Admitting: Internal Medicine

## 2020-05-09 VITALS — BP 130/72 | HR 67 | Temp 97.3°F | Ht 70.0 in | Wt 142.1 lb

## 2020-05-09 DIAGNOSIS — Z7189 Other specified counseling: Secondary | ICD-10-CM

## 2020-05-09 DIAGNOSIS — R0609 Other forms of dyspnea: Secondary | ICD-10-CM

## 2020-05-09 DIAGNOSIS — R5381 Other malaise: Secondary | ICD-10-CM

## 2020-05-09 DIAGNOSIS — R0600 Dyspnea, unspecified: Secondary | ICD-10-CM

## 2020-05-09 DIAGNOSIS — Z7185 Encounter for immunization safety counseling: Secondary | ICD-10-CM

## 2020-05-09 DIAGNOSIS — J439 Emphysema, unspecified: Secondary | ICD-10-CM | POA: Diagnosis not present

## 2020-05-09 NOTE — Patient Instructions (Signed)
ICD-10-CM   1. Pulmonary emphysema, unspecified emphysema type ([place])  J43.9   2. Physical deconditioning  R53.81   3. Dyspnea on exertion  R06.00   4. Vaccine counseling  Z71.89    Glad you are doing much better and overall stable Gait issues are related to prior stroke  Plan -Please talk to your primary care physician or neurologist about gait issues -Refer to pulmonary rehabilitation for emphysema -Continue Anoro schedule with albuterol as needed -Do CT chest without contrast sometime in the next few weeks to several months -Continue Flonase any other medications -Ensure up-to-date with Covid booster when it is approved and also flu shot for the season  Follow-up -6 months for routine follow-up 15-minute slot  -You can get a CT chest earlier and we will call you with the results or you can get it at the time of next visit

## 2020-05-09 NOTE — Progress Notes (Signed)
PCP Kathyrn Lass, MD   HPI   IOV  08/22/2017   Chief Complaint  Patient presents with  . Advice Only    Referred by Dr. Lennette Bihari Via for COPD.  Pt does have complaints of SOB. Pt had pna and was hospitalized 10/5-10/26    Travis Salinas. Travis Salinas is a 69 year old male who is been referred by primary care physician for COPD.  History is obtained from him, talking to his wife and review of the outside chart from primary care physician and past medical chart in the electronic health record.  He carries a diagnosis of COPD for many years.  He is always maintained himself on albuterol as needed.  He quit smoking 16 years ago.  Then all of a sudden in September 2018 he was hospitalized for rhinovirus and associated pneumococcal bacteremia with pneumonia.  He spent over a month in the hospital including several weeks in the intensive care unit being treated with BiPAP but not intubated.  He spent time between the critical care service and also the hospitalist service.  He ended up getting discharged in October 2018 to a long-term acute care facility and from there to an inpatient rehab facility.  He has now been home for 6 weeks.  At this point in time his COPD CAT score is 12.  He feels improved and back to baseline but according to his wife he still far away from baseline.  Overall fatigue and shortness of breath with exertion or major issues.  This is documented in the COPD CAD score.  His current COPD medications only albuterol as needed.  He does not have pulmonary function test in the past.  Review of the chart also shows he had acute diastolic heart failure while in the hospital and he was diuresed.  A few days ago primary care physician is stopped his Lasix and his told the family that I could restart it at my discretion.  His last set of lab work was in November 2018 with primary care physician but in October 2018 in our health record system he had normal creatinine but anemia of chronic  disease/critical illness  His last chest x-ray that I personally visualized was June 17, 2017 that still showed pulmonary infiltrates.  According to the family he had another chest x-ray in November 2018 that still shows pulmonary infiltrates.  He has not had follow-up imaging since then.   OV 10/01/2017  Chief Complaint  Patient presents with  . Follow-up    PFT done today.  CT scan done 09/19/17.  Pt states he has been doing much better since last visit. States he has mild SOB with exertion. Pt's wife states he used his O2 last night and some this morning which pt stated it was to prepare for today's visit.   Follow-up emphysema and recent hospitalization for pneumococcal pneumonia  Present his wife.  In the interim he has been on Spiriva.  Smoking is in remission.  He continues to improve.  COPD CAT score is dropped to 6.  He feels great.  He is here to start pulmonary rehabilitation.  He had follow-up CT scan of the chest in January 2019 and when compared to his pneumonia in September 2018 his or pulmonary infiltrates of all clear except he has bilateral upper lobe scarring.  He does have evidence of emphysema on a CT chest.  His pulmonary function test is normal except for reduction in DLCO at 40%.  He continues to use  oxygen at night.  He is compliant with his Spiriva.  His blood work is all normal.  His wife wants FMLA paperwork filled because she has to take him to rehab.  They are wondering about the rehab referral.    OV 02/17/2018  Chief Complaint  Patient presents with  . Follow-up    36-month follow up visit. Pt states he has been doing well and denies any complaints.   Follow-up pulmonary emphysema with isolated reduction in diffusion capacity and history of smoking. CT Jan 2019 with UL fibrosis. Sept 2018 hospitalization for RV and pneumococcal pneumonia  Last seen January 2019.  After that he had COPD exacerbation and seen acutely and treated as an outpatient.  This was in  March 2019 when he saw my colleague Dr. Halford Chessman.  Chest x-ray at that time was clear.  Currently he is feeling well with Spiriva.  He denies any smoking.  Wife also states he does not smoke anymore.  COPD CAT score is 9 which is close to his baseline.  He stopped attending pulmonary rehabilitation because of travel and his exacerbation and he wants to rejoin.  Given his emphysema and age of 20 he might be a candidate for lung cancer screening program with a nurse intake smoking history seems inadequate for that.  We will have to reassess his smoking history in detail.      OV 10/01/2018  Subjective:  Patient ID: Travis Salinas, male , DOB: [birth year] , age 51 y.o. , MRN: [id number] , ADDRESS: [place] [id number]   10/01/2018 -   Chief Complaint  Patient presents with  . Follow-up    Pt states he has had no energy the last 3 months and states his breathing has been worse. Pt also states it feels like someone is sitting on his chest.    Follow-up pulmonary emphysema (alpha 1 MS) with isolated reduction in diffusion capacity and history of smoking. CT Jan 2019 with UL fibrosis. Sept 2018 hospitalization for RV and pneumococcal pneumonia    HPI Travis Salinas 69 y.o. -last visit June 2019.  He presents with his wife.  He was attending pulmonary rehabilitation till November 2019.  After that he is not attending.  Now he is reporting more fatigue.  He states he lives on the third floor with a roommate [?  He does not live with his wife].  However he does state that he can walk up all 3 floors without stopping.  His COPD CAT score seems significantly worse than even in the past when he had pneumonia.  I am not sure he is answered this correctly.  Nevertheless fatigue and shortness of breath with exertion seem to be a dominant thing.  His wife feels he needs to get back to exercise.  He insists he does not smoke.  Wife also does not he does not smoke.  Review of his immunization  records sho that he could benefit from pneumonia vaccine.  He does not recollect having had one.   OV 05/27/2019  Subjective:  Patient ID: Travis Salinas, male , DOB: 01-05-1951 , age 72 y.o. , MRN: [id number] , ADDRESS: [place] [id number]   05/27/2019 -   Chief Complaint  Patient presents with  . Pulmonary emphysema, unspecified emphysema type    Feels breathing is the same as at last visit in February. Medications are still working.     Follow-up pulmonary emphysema (alpha 1 MS)  with isolated reduction in diffusion capacity and history of smoking. CT Jan 2019 with UL fibrosis. Sept 2018 hospitalization for RV and pneumococcal pneumonia   HPI Travis Salinas 69 y.o. -presents for follow-up of the above issues.  He says he is doing well.  He still has dyspnea on exertion when he climbs 3 flights of stairs to his apartment.  But he says he does not desaturate below 94%.  He is not doing pulmonary rehabilitation.  He is doing Spiriva and DuoNeb.  He says he has had a flu shot for this 2020-2021 season.  His COPD CAT score is higher but I am not so sure he answered it reliably.  He had a CT scan of the chest in July which is stable.        IMPRESSION: 1. Moderate centrilobular and paraseptal emphysema with mild diffuse bronchial wall thickening, compatible with the reported history of COPD. 2. Upper lobe predominant patchy reticulation and parenchymal banding, unchanged, compatible with nonspecific smoking related fibrosis versus postinfectious/postinflammatory scarring. Findings are not suggestive of interstitial lung disease. No acute pulmonary disease. 3. Three-vessel coronary atherosclerosis.  Aortic Atherosclerosis (ICD10-I70.0) and Emphysema (ICD10-J43.9).   Electronically Signed   By: Ilona Sorrel M.D.   On: 03/24/2019 10:02  ROS - per HPI   OV 05/09/2020  Subjective:  Patient ID: Travis Salinas, male , DOB: 1951-01-12 , age 41  y.o. , MRN: [id number] , AD[address]ESS: [address] [place] [id number]   05/09/2020 -   Chief Complaint  Patient presents with  . Follow-up    COPD     HPI Travis Salinas 69 y.o. -presents for follow-up of COPD.  Personally last seen in September 2020 after the nurse practitioner saw him earlier in 2021.  He is here with his wife.  He tells me that he lives on the third floor and is able to climb up without stopping.  He is able to walk around his house.  Because of his prior stroke issues he is slow with this according to his wife.  He also describes some balance issues early morning when he gets up fast.  He starts working sideways.  His wife tells me this is baseline and he has been counseled as to how to deal with this by physical therapy.  He used to attend pulmonary rehabilitation is interested in the idea of revisiting with this.  His last CT chest was in July 2020.  At that time no evidence of lung cancer.  He is happy with his Anoro.       CAT COPD Symptom & Quality of Life Score (GSK trademark) 0 is no burden. 5 is highest burden 08/22/2017  1 10/01/2017 spiriva and time 02/17/2018  10/01/2018   Never Cough -> Cough all the time 0 0 0 5  No phlegm in chest -> Chest is full of phlegm 1 0 1 0  No chest tightness -> Chest feels very tight 4 0 2 5  No dyspnea for 1 flight stairs/hill -> Very dyspneic for 1 flight of stairs 2 2 0 5  No limitations for ADL at home -> Very limited with ADL at home 0 1 0 5  Confident leaving home -> Not at all confident leaving home 0 0 0 0  Sleep soundly -> Do not sleep soundly because of lung condition 4 0 3 0  Lots of Energy -> No energy at all  3 3 5   TOTAL  Score (max 40)  12 6 9 25    ROS - per HPI CAT Score 05/09/2020 10/21/2019 02/17/2018  Total CAT Score 6 17 9     PFT Results Latest Ref Rng & Units 10/01/2017  FVC-Pre L 4.30  FVC-Predicted Pre % 93  FVC-Post L 4.32  FVC-Predicted Post % 94  Pre FEV1/FVC % % 77  Post  FEV1/FCV % % 80  FEV1-Pre L 3.32  FEV1-Predicted Pre % 97  FEV1-Post L 3.47  DLCO uncorrected ml/min/mmHg 13.05  DLCO UNC% % 40  DLCO corrected ml/min/mmHg 13.57  DLCO COR %Predicted % 41  DLVA Predicted % 50  TLC L 5.69  TLC % Predicted % 81  RV % Predicted % 60      has a past medical history of Arthritis, Cervical disc herniation, COPD (chronic obstructive pulmonary disease) ([place]), Headache, Hepatitis C, and Hypertension.   reports that he quit smoking about 19 years ago. His smoking use included cigarettes. He has a 32.00 pack-year smoking history. He has never used smokeless tobacco.  Past Surgical History:  Procedure Laterality Date  . APPLICATION OF A-CELL OF EXTREMITY Left 12/05/2016   Procedure: APPLICATION OF A-CELL OF EXTREMITY;  Surgeon: Loel Lofty Dillingham, DO;  Location: [facility];  Service: Plastics;  Laterality: Left;  . APPLICATION OF WOUND VAC Left 12/05/2016   Procedure: APPLICATION OF WOUND VAC;  Surgeon: Loel Lofty Dillingham, DO;  Location: [facility];  Service: Plastics;  Laterality: Left;  . I & D EXTREMITY Left 11/03/2016   Procedure: IRRIGATION AND DEBRIDEMENT EXTREMITY;  Surgeon: Leandrew Koyanagi, MD;  Location: [facility];  Service: Orthopedics;  Laterality: Left;  . I & D EXTREMITY Left 12/05/2016   Procedure: IRRIGATION AND DEBRIDEMENT EXTREMITY;  Surgeon: Loel Lofty Dillingham, DO;  Location: [facility];  Service: Plastics;  Laterality: Left;  . INCISION AND DRAINAGE ABSCESS Right 11/12/2013   Procedure: INCISION AND DRAINAGE AND OPEN PACKING OF RIGHT CALF  ABSCESS;  Surgeon: Earnstine Regal, MD;  Location: [facility];  Service: General;  Laterality: Right;  . INCISION AND DRAINAGE OF WOUND Left 01/29/2017   Procedure: IRRIGATION AND DEBRIDEMENT OF LEFT LEG WOUND WITH ABRA PLACEMENT AND PLACEMENT OF WOUND VAC;  Surgeon: Wallace Going, DO;  Location: [facility];  Service: Plastics;  Laterality: Left;  . KNEE SURGERY      Allergies  Allergen Reactions  . Propoxyphene Nausea And Vomiting   . Amoxicillin Nausea And Vomiting  . Ciprofloxacin Nausea Only  . Depakote [Divalproex Sodium] Other (See Comments)    hallucinations    Immunization History  Administered Date(s) Administered  . Influenza, High Dose Seasonal PF 06/23/2017, 05/13/2018, 05/20/2019  . Influenza, Seasonal, Injecte, Preservative Fre 07/10/2016  . PFIZER SARS-COV-2 Vaccination 10/01/2019  . Pneumococcal Conjugate-13 10/01/2018  . Tdap 11/05/2013  . Zoster Recombinat (Shingrix) 05/06/2018, 07/09/2018    Family History  Problem Relation Age of Onset  . Heart disease Mother   . Stroke Mother   . Heart disease Father   . Stroke Father      Current Outpatient Medications:  .  albuterol (VENTOLIN HFA) 108 (90 Base) MCG/ACT inhaler, Inhale 2 puffs into the lungs every 6 (six) hours as needed for wheezing or shortness of breath., Disp: 6.7 g, Rfl: 0 .  amiodarone (PACERONE) 200 MG tablet, Take 1 tablet (200 mg total) by mouth daily., Disp: 90 tablet, Rfl: 2 .  ARIPiprazole (ABILIFY) 10 MG tablet, Take 1 tablet (10 mg total) by mouth daily., Disp:  , Rfl:  .  aspirin EC 81 MG EC tablet, Take 1 tablet (81 mg total) by mouth daily., Disp:  , Rfl:  .  atorvastatin (LIPITOR) 40 MG tablet, Take 1 tablet (40 mg total) by mouth daily., Disp: 90 tablet, Rfl: 3 .  carvedilol (COREG) 3.125 MG tablet, Take 1 tablet (3.125 mg total) by mouth 2 (two) times daily with a meal., Disp: 180 tablet, Rfl: 3 .  clonazePAM (KLONOPIN) 1 MG tablet, Take 1 tablet (1 mg total) by mouth 2 (two) times daily., Disp: 30 tablet, Rfl: 0 .  FLUoxetine (PROZAC) 10 MG tablet, Take 1 tablet (10 mg total) by mouth daily., Disp: 30 tablet, Rfl: 3 .  fluticasone (FLONASE) 50 MCG/ACT nasal spray, Place 2 sprays into both nostrils as needed for allergies or rhinitis. , Disp: , Rfl:  .  lamoTRIgine (LAMICTAL) 200 MG tablet, Take 1 tablet (200 mg total) by mouth every morning., Disp: 30 tablet, Rfl: 0 .  losartan (COZAAR) 50 MG tablet, Take 1 tablet  (50 mg total) by mouth daily., Disp: 90 tablet, Rfl: 3 .  Multiple Vitamins-Minerals (MULTIVITAMIN WITH MINERALS) tablet, Take 1 tablet by mouth daily., Disp: , Rfl:  .  oxyCODONE (OXYCONTIN) 20 mg 12 hr tablet, Take 1 tablet (20 mg total) by mouth every 12 (twelve) hours., Disp: 14 tablet, Rfl: 0 .  polyethylene glycol (MIRALAX / GLYCOLAX) 17 g packet, Take 17 g by mouth daily as needed., Disp: , Rfl:  .  silodosin (RAPAFLO) 8 MG CAPS capsule, Take 1 capsule (8 mg total) by mouth daily with breakfast., Disp: 30 capsule, Rfl: 0 .  topiramate (TOPAMAX) 25 MG tablet, Take 25 mg by mouth daily., Disp: , Rfl:  .  umeclidinium-vilanterol (ANORO ELLIPTA) 62.5-25 MCG/INH AEPB, Inhale 1 puff into the lungs daily., Disp: 60 each, Rfl: 3      Objective:   Vitals:   05/09/20 1413  BP: 130/72  Pulse: 67  Temp: (!) 97.3 F (36.3 C)  TempSrc: Oral  SpO2: 96%  Weight: 142 lb 1.6 oz (64.5 kg)  Height: 5\' 10"  (1.778 m)    Estimated body mass index is 20.39 kg/m as calculated from the following:   Height as of this encounter: 5\' 10"  (1.778 m).   Weight as of this encounter: 142 lb 1.6 oz (64.5 kg).  @WEIGHTCHANGE @  [employer]   05/09/20 1413  Weight: 142 lb 1.6 oz (64.5 kg)     Physical Exam Deconditioned looking male.  Frail.  Alert and oriented x3.  Slow speech clear to auscultation bilaterally normal heart sounds abdomen soft no cyanosis no clubbing no edema.  BMI 20.3        Assessment:       ICD-10-CM   1. Pulmonary emphysema, unspecified emphysema type ([place])  J43.9   2. Physical deconditioning  R53.81   3. Dyspnea on exertion  R06.00   4. Vaccine counseling  Z71.89        Plan:     Patient Instructions     ICD-10-CM   1. Pulmonary emphysema, unspecified emphysema type ([place])  J43.9   2. Physical deconditioning  R53.81   3. Dyspnea on exertion  R06.00   4. Vaccine counseling  Z71.89    Glad you are doing much better and overall stable Gait issues are related to  prior stroke  Plan -Please talk to your primary care physician or neurologist about gait issues -Refer to pulmonary rehabilitation for emphysema -Continue Anoro schedule with albuterol as needed -Do CT chest without contrast  sometime in the next few weeks to several months -Continue Flonase any other medications -Ensure up-to-date with Covid booster when it is approved and also flu shot for the season  Follow-up -6 months for routine follow-up 15-minute slot  -You can get a CT chest earlier and we will call you with the results or you can get it at the time of next visit     SIGNATURE    Dr. Brand Males, M.D., F.C.C.P,  Pulmonary and Critical Care Medicine Staff Physician, [place] Director - Interstitial Lung Disease  Program  Pulmonary [facility] [place] at [place], [place], [address]  Pager: [phone], If no answer or between  15:00h - 7:00h: call [phone] Telephone: [phone]  2:48 PM 05/09/2020

## 2020-05-10 DIAGNOSIS — I10 Essential (primary) hypertension: Secondary | ICD-10-CM

## 2020-05-11 MED ORDER — LOSARTAN POTASSIUM 100 MG PO TABS
100.0000 mg | ORAL_TABLET | Freq: Every day | ORAL | 3 refills | Status: DC
Start: 2020-05-11 — End: 2020-06-25

## 2020-05-11 NOTE — Telephone Encounter (Signed)
BP above goal. Please call patient PLAN:  1. Increase Losartan to 100 mg once daily  2. BMET 2 weeks  Richardson Dopp, PA-C    05/11/2020 2:46 PM

## 2020-05-11 NOTE — Telephone Encounter (Signed)
I spoke with patient's ex wife (on [place]) and gave her information from [place], [place].  I let her know message had also been sent through my chart.  Will send prescription for increased dose of Losartan to [facility].  Patient will come in for lab work on 05/25/20

## 2020-05-16 ENCOUNTER — Other Ambulatory Visit: Payer: Self-pay | Admitting: Internal Medicine

## 2020-05-16 ENCOUNTER — Other Ambulatory Visit: Payer: Self-pay | Admitting: Primary Care

## 2020-05-25 ENCOUNTER — Other Ambulatory Visit (HOSPITAL_COMMUNITY): Payer: Self-pay | Admitting: Family Medicine

## 2020-05-25 ENCOUNTER — Other Ambulatory Visit: Payer: Self-pay

## 2020-05-25 ENCOUNTER — Other Ambulatory Visit: Payer: PPO | Admitting: *Deleted

## 2020-05-25 DIAGNOSIS — I10 Essential (primary) hypertension: Secondary | ICD-10-CM

## 2020-05-25 DIAGNOSIS — I69354 Hemiplegia and hemiparesis following cerebral infarction affecting left non-dominant side: Secondary | ICD-10-CM | POA: Diagnosis not present

## 2020-05-25 DIAGNOSIS — J449 Chronic obstructive pulmonary disease, unspecified: Secondary | ICD-10-CM | POA: Diagnosis not present

## 2020-05-25 DIAGNOSIS — R0981 Nasal congestion: Secondary | ICD-10-CM | POA: Diagnosis not present

## 2020-05-25 DIAGNOSIS — F39 Unspecified mood [affective] disorder: Secondary | ICD-10-CM | POA: Diagnosis not present

## 2020-05-25 DIAGNOSIS — J341 Cyst and mucocele of nose and nasal sinus: Secondary | ICD-10-CM | POA: Diagnosis not present

## 2020-05-25 LAB — BASIC METABOLIC PANEL
BUN/Creatinine Ratio: 25 — ABNORMAL HIGH (ref 10–24)
BUN: 30 mg/dL — ABNORMAL HIGH (ref 8–27)
CO2: 20 mmol/L (ref 20–29)
Calcium: 8.8 mg/dL (ref 8.6–10.2)
Chloride: 110 mmol/L — ABNORMAL HIGH (ref 96–106)
Creatinine, Ser: 1.18 mg/dL (ref 0.76–1.27)
GFR calc Af Amer: 73 mL/min/{1.73_m2} (ref >59)
GFR calc non Af Amer: 63 mL/min/{1.73_m2} (ref >59)
Glucose: 97 mg/dL (ref 65–99)
Potassium: 4.5 mmol/L (ref 3.5–5.2)
Sodium: 144 mmol/L (ref 134–144)

## 2020-06-19 ENCOUNTER — Ambulatory Visit: Payer: PPO | Admitting: Adult Health

## 2020-06-19 ENCOUNTER — Other Ambulatory Visit: Payer: Self-pay

## 2020-06-19 ENCOUNTER — Encounter: Payer: Self-pay | Admitting: Adult Health

## 2020-06-19 VITALS — BP 136/68 | HR 58 | Ht 71.0 in | Wt 149.0 lb

## 2020-06-19 DIAGNOSIS — R519 Headache, unspecified: Secondary | ICD-10-CM | POA: Diagnosis not present

## 2020-06-19 DIAGNOSIS — R269 Unspecified abnormalities of gait and mobility: Secondary | ICD-10-CM | POA: Diagnosis not present

## 2020-06-19 DIAGNOSIS — I10 Essential (primary) hypertension: Secondary | ICD-10-CM

## 2020-06-19 DIAGNOSIS — I749 Embolism and thrombosis of unspecified artery: Secondary | ICD-10-CM

## 2020-06-19 DIAGNOSIS — I69398 Other sequelae of cerebral infarction: Secondary | ICD-10-CM

## 2020-06-19 DIAGNOSIS — E785 Hyperlipidemia, unspecified: Secondary | ICD-10-CM | POA: Diagnosis not present

## 2020-06-19 NOTE — Patient Instructions (Signed)
Continue aspirin 81 mg daily  and atrovastatin  for secondary stroke prevention  Continue Topamax 25 mg daily for headache management  Continue to follow up with PCP regarding cholesterol and blood pressure management  Maintain strict control of hypertension with blood pressure goal below 130/90 and cholesterol with LDL cholesterol (bad cholesterol) goal below 70 mg/dL.      Followup in the future with me in 6 months or call earlier if needed       Thank you for coming to see [place] at [facility]. I hope we have been able to provide you high quality care today.  You may receive a patient satisfaction survey over the next few weeks. We would appreciate your feedback and comments so that we may continue to improve ourselves and the health of our patients.

## 2020-06-19 NOTE — Progress Notes (Signed)
I agree with the above plan 

## 2020-06-19 NOTE — Progress Notes (Signed)
[facility] [address] [place]. [place] [address] [phone]       OFFICE FOLLOW-UP NOTE  [name] Date of Birth:  01-08-1951 Medical Record Number:  [id number]   Chief Complaint  Patient presents with  . Follow-up    tx rm  . Cerebrovascular Accident    Pt said he had  a fall about 2 weeks ago on his hand and leg      HPI:   Today, 06/19/2020, [name] for stroke follow-up accompanied by his ex-wife.  Stable from stroke standpoint with occasional imbalance with some improvement since prior visit.  He reports having good days and bad days and will have worsening imbalance with quick position changes.  He did have a fall 2 weeks ago after he bent over and quickly stood back up and lost his balance.  Residual minor skin abrasion otherwise no injuries.  Denies hitting his head.  Completed neuro rehab PT and plans on starting pulmonary rehab next week.  Denies new or worsening stroke/TIA symptoms.  Remains on aspirin 81 mg daily without bleeding or bruising.  Remains on atorvastatin 40 mg daily without myalgias.  Blood pressure today 136/68.  Remains on Topamax without side effects for headache management with benefit.  No further concerns at this time.    History provided for reference purposes only Initial visit 03/06/2020 Dr. Leonie Salinas: [name]. Travis Salinas is a 69 year old Caucasian male seen today for initial office follow-up visit following hospital consultation for stroke in April 2021.  He has past medical history of hypertension, hyperlipidemia, hepatitis C, degenerative cervical spine disease, COPD who presented on 01/01/2020 with sudden onset of gait instability, several falls, left-sided numbness and bilateral lower extremity weakness.  He was given IV TPA uneventfully.  Urine drug screen was positive for cocaine.  CT scan of the head showed no acute abnormality but MRI scan showed by cerebral embolic infarcts involving bilateral ACA, left MCA  and bilateral cerebellum.  MRI of the brain showed no large vessel stenosis or occlusion and carotid ultrasound showed no significant extracranial stenosis.  2D echo showed diminished ejection fraction of 35 to 40% but no definite embolus.  LDL cholesterol was 87 mg percent and hemoglobin A1c was 5.9.  Cardiology was consulted for Takotsubo like pattern on his echo and recommended addition of coverage and ARB.  Patient was transferred to inpatient rehab.  He states he did well and was discharged home after week.  He was doing quite well until last week when he fell going down the steps and landed on his forehead and he has an abrasion.  He did not lose consciousness.  But since then his balance has been off.  He has to walk slowly to avoid falls.  Is currently participating in outpatient therapy and walks with a walker long distances.  His balance he feels is still not good.  His blood pressures well controlled and today it is 102/64.  He is tolerating Lipitor well without muscle aches and pains.  Is tolerating aspirin and Plavix but does bruise easily.  He states he has quit cocaine and drugs.  He was having daily headaches but since the addition of Topamax it seems to be working quite well and his headaches are not as frequent.  He is only taking 25 mg daily.     ROS:   14 system review of systems is positive for imbalance and all other systems negative  PMH:  Past Medical History:  Diagnosis Date  . Arthritis   . Cervical disc herniation   . COPD (chronic obstructive pulmonary disease) ([place])   . Headache    " due to antibiotics"  . Hepatitis C   . Hypertension     Social History:  Social History   Socioeconomic History  . Marital status: Married    Spouse name: Not on file  . Number of children: Not on file  . Years of education: Not on file  . Highest education level: Not on file  Occupational History  . Not on file  Tobacco Use  . Smoking status: Former Smoker    Packs/day: 1.00     Years: 32.00    Pack years: 32.00    Types: Cigarettes    Quit date: 2002    Years since quitting: 19.7  . Smokeless tobacco: Never Used  Vaping Use  . Vaping Use: Never used  Substance and Sexual Activity  . Alcohol use: No    Alcohol/week: 0.0 standard drinks  . Drug use: Not Currently    Types: Heroin, Cocaine    Comment: " relapsed once this year " 2018  . Sexual activity: Not on file  Other Topics Concern  . Not on file  Social History Narrative   Lives with roommate In [place]    Married, lives separate from wife   Right handed   Caffeine: occasionally coffee and coke    Social Determinants of Health   Financial Resource Strain:   . Difficulty of Paying Living Expenses: Not on file  Food Insecurity:   . Worried About Charity fundraiser in the Last Year: Not on file  . Ran Out of Food in the Last Year: Not on file  Transportation Needs:   . Lack of Transportation (Medical): Not on file  . Lack of Transportation (Non-Medical): Not on file  Physical Activity:   . Days of Exercise per Week: Not on file  . Minutes of Exercise per Session: Not on file  Stress:   . Feeling of Stress : Not on file  Social Connections:   . Frequency of Communication with Friends and Family: Not on file  . Frequency of Social Gatherings with Friends and Family: Not on file  . Attends Religious Services: Not on file  . Active Member of Clubs or Organizations: Not on file  . Attends Archivist Meetings: Not on file  . Marital Status: Not on file  Intimate Partner Violence:   . Fear of Current or Ex-Partner: Not on file  . Emotionally Abused: Not on file  . Physically Abused: Not on file  . Sexually Abused: Not on file    Medications:   Current Outpatient Medications on File Prior to Visit  Medication Sig Dispense Refill  . albuterol (VENTOLIN HFA) 108 (90 Base) MCG/ACT inhaler Inhale 2 puffs into the lungs every 6 (six) hours as needed for wheezing or shortness of breath.  6.7 g 0  . amiodarone (PACERONE) 200 MG tablet Take 1 tablet (200 mg total) by mouth daily. 90 tablet 2  . ANORO ELLIPTA 62.5-25 MCG/INH AEPB INHALE 1 PUFF BY MOUTH INTO LUNGS DAILY 60 each 3  . ARIPiprazole (ABILIFY) 10 MG tablet Take 1 tablet (10 mg total) by mouth daily.    Marland Kitchen aspirin EC 81 MG EC tablet Take 1 tablet (81 mg total) by mouth daily.    Marland Kitchen atorvastatin (LIPITOR) 40 MG tablet Take 1 tablet (40 mg total) by mouth daily. 90 tablet 3  .  carvedilol (COREG) 3.125 MG tablet Take 1 tablet (3.125 mg total) by mouth 2 (two) times daily with a meal. 180 tablet 3  . clonazePAM (KLONOPIN) 1 MG tablet Take 1 tablet (1 mg total) by mouth 2 (two) times daily. 30 tablet 0  . FLUoxetine (PROZAC) 10 MG tablet Take 1 tablet (10 mg total) by mouth daily. 30 tablet 3  . fluticasone (FLONASE) 50 MCG/ACT nasal spray Place 2 sprays into both nostrils as needed for allergies or rhinitis.     Marland Kitchen lamoTRIgine (LAMICTAL) 200 MG tablet Take 1 tablet (200 mg total) by mouth every morning. 30 tablet 0  . losartan (COZAAR) 100 MG tablet Take 1 tablet (100 mg total) by mouth daily. 90 tablet 3  . Multiple Vitamins-Minerals (MULTIVITAMIN WITH MINERALS) tablet Take 1 tablet by mouth daily.    Marland Kitchen oxyCODONE (OXYCONTIN) 20 mg 12 hr tablet Take 1 tablet (20 mg total) by mouth every 12 (twelve) hours. 14 tablet 0  . polyethylene glycol (MIRALAX / GLYCOLAX) 17 g packet Take 17 g by mouth daily as needed.    . silodosin (RAPAFLO) 8 MG CAPS capsule Take 1 capsule (8 mg total) by mouth daily with breakfast. 30 capsule 0  . topiramate (TOPAMAX) 25 MG tablet Take 25 mg by mouth daily.     No current facility-administered medications on file prior to visit.    Allergies:   Allergies  Allergen Reactions  . Propoxyphene Nausea And Vomiting  . Amoxicillin Nausea And Vomiting  . Ciprofloxacin Nausea Only  . Depakote [Divalproex Sodium] Other (See Comments)    hallucinations    Physical Exam Today's Vitals   06/19/20 0930   BP: 136/68  Pulse: (!) 58  Weight: 149 lb (67.6 kg)  Height: 5\' 11"  (1.803 m)   Body mass index is 20.78 kg/m.   General: Frail pleasant middle-age Caucasian male, seated, in no evident distress Head: head normocephalic and atraumatic.   Neck: supple with no carotid or supraclavicular bruits Cardiovascular: regular rate and rhythm, no murmurs Musculoskeletal: no deformity Skin:  no rash but scattered petichiae; right hand covered in gauze due to recent fall Vascular:  Normal pulses all extremities  Neurologic Exam Mental Status: Awake and fully alert.  Fluent speech language.  Oriented to place and time. Recent and remote memory intact. Attention span, concentration and fund of knowledge appropriate. Mood and affect appropriate.  Cranial Nerves: Pupils equal, briskly reactive to light. Extraocular movements full without nystagmus. Visual fields full to confrontation. Hearing diminished bilaterally.. Facial sensation intact.  Mild left lower facial asymmetry., tongue, palate moves normally and symmetrically.  Motor: Normal bulk and tone. Normal strength in all tested extremity muscles except diminished fine finger movements on the left.  Orbits right over left upper extremity. Sensory.: intact to touch ,pinprick .position and vibratory sensation.  Coordination: Rapid alternating movements normal in all extremities except slightly decreased left hand. Finger-to-nose and heel-to-shin performed accurately bilaterally. Gait and Station: Arises from chair without difficulty. Stance is broad-based.  Gait demonstrates mild ataxia and imbalance with initiating gait.  No use of assistive device.  Unable to perform tandem walk. reflexes: 1+ and symmetric. Toes downgoing.      ASSESSMENT/PLAN: 69 year old Caucasian male with by cerebral embolic infarcts in April 2021 secondary to cocaine related cardiomyopathy and vasculitis with residual imbalance with gait ataxia and decreased left hand  dexterity.  Multiple vascular risk factors of hypertension, hyperlipidemia, takotsubo cardiomyopathy, tobacco and cocaine abuse.  He has daily headaches which appear to have  responded quite well to Topamax.      -Continue exercises as advised by neuro rehab PT. discussed importance of changing positions slowly such as sitting to standing or bending over to standing to help minimize/avoid falls -Continue aspirin 81 mg daily and atorvastatin 40 mg daily for secondary stroke prevention -Continue to follow with PCP and cardiology for aggressive stroke risk factor management and maintain aggressive risk factor modification with strict control of hypertension with blood pressure goal below 130/90 and lipids with LDL cholesterol goal below 70 mg percent.   -Reports continued tobacco and cocaine cessation -continue Topamax 25 mg daily for headache prophylaxis - will consider discontinuing at follow-up visit to assess ongoing need   Follow-up in 6 months or call earlier if needed   I spent 30 minutes of face-to-face and non-face-to-face time with patient and ex-wife.  This included previsit chart review, lab review, study review, order entry, electronic health record documentation, patient education and discussion regarding prior stroke, residual deficits, secondary stroke prevention measures and importance of aggressive stroke risk factor management, ongoing use of Topamax for headache prophylaxis and answered all other questions to patient and ex-wife satisfaction  Frann Rider, AGNP-BC  [facility] [address] [place], [place] [address]  Phone [phone] Fax [phone] Note: This document was prepared with digital dictation and possible smart phrase technology. Any transcriptional errors that result from this process are unintentional.

## 2020-06-21 ENCOUNTER — Other Ambulatory Visit (HOSPITAL_COMMUNITY): Payer: Self-pay | Admitting: Psychiatry

## 2020-06-21 DIAGNOSIS — G2111 Neuroleptic induced parkinsonism: Secondary | ICD-10-CM | POA: Diagnosis not present

## 2020-06-21 DIAGNOSIS — F39 Unspecified mood [affective] disorder: Secondary | ICD-10-CM | POA: Diagnosis not present

## 2020-06-21 DIAGNOSIS — F411 Generalized anxiety disorder: Secondary | ICD-10-CM | POA: Diagnosis not present

## 2020-06-22 ENCOUNTER — Other Ambulatory Visit (HOSPITAL_COMMUNITY): Payer: Self-pay | Admitting: Sports Medicine

## 2020-06-22 DIAGNOSIS — Z23 Encounter for immunization: Secondary | ICD-10-CM

## 2020-06-22 DIAGNOSIS — I69398 Other sequelae of cerebral infarction: Secondary | ICD-10-CM

## 2020-06-22 DIAGNOSIS — M1990 Unspecified osteoarthritis, unspecified site: Secondary | ICD-10-CM | POA: Diagnosis present

## 2020-06-22 DIAGNOSIS — Z79899 Other long term (current) drug therapy: Secondary | ICD-10-CM | POA: Diagnosis not present

## 2020-06-22 DIAGNOSIS — I77811 Abdominal aortic ectasia: Secondary | ICD-10-CM | POA: Diagnosis not present

## 2020-06-22 DIAGNOSIS — J320 Chronic maxillary sinusitis: Secondary | ICD-10-CM | POA: Diagnosis not present

## 2020-06-22 DIAGNOSIS — J3489 Other specified disorders of nose and nasal sinuses: Secondary | ICD-10-CM | POA: Diagnosis not present

## 2020-06-22 DIAGNOSIS — R0902 Hypoxemia: Secondary | ICD-10-CM | POA: Diagnosis not present

## 2020-06-22 DIAGNOSIS — Z79891 Long term (current) use of opiate analgesic: Secondary | ICD-10-CM | POA: Diagnosis not present

## 2020-06-22 DIAGNOSIS — D649 Anemia, unspecified: Secondary | ICD-10-CM | POA: Diagnosis present

## 2020-06-22 DIAGNOSIS — S3991XA Unspecified injury of abdomen, initial encounter: Secondary | ICD-10-CM | POA: Diagnosis not present

## 2020-06-22 DIAGNOSIS — F319 Bipolar disorder, unspecified: Secondary | ICD-10-CM | POA: Diagnosis not present

## 2020-06-22 DIAGNOSIS — J449 Chronic obstructive pulmonary disease, unspecified: Secondary | ICD-10-CM | POA: Diagnosis not present

## 2020-06-22 DIAGNOSIS — Z823 Family history of stroke: Secondary | ICD-10-CM | POA: Diagnosis not present

## 2020-06-22 DIAGNOSIS — W010XXA Fall on same level from slipping, tripping and stumbling without subsequent striking against object, initial encounter: Secondary | ICD-10-CM | POA: Diagnosis present

## 2020-06-22 DIAGNOSIS — N200 Calculus of kidney: Secondary | ICD-10-CM | POA: Diagnosis not present

## 2020-06-22 DIAGNOSIS — I110 Hypertensive heart disease with heart failure: Secondary | ICD-10-CM | POA: Diagnosis present

## 2020-06-22 DIAGNOSIS — R296 Repeated falls: Secondary | ICD-10-CM | POA: Diagnosis not present

## 2020-06-22 DIAGNOSIS — Z7982 Long term (current) use of aspirin: Secondary | ICD-10-CM

## 2020-06-22 DIAGNOSIS — Z880 Allergy status to penicillin: Secondary | ICD-10-CM

## 2020-06-22 DIAGNOSIS — I480 Paroxysmal atrial fibrillation: Secondary | ICD-10-CM | POA: Diagnosis not present

## 2020-06-22 DIAGNOSIS — Z8249 Family history of ischemic heart disease and other diseases of the circulatory system: Secondary | ICD-10-CM | POA: Diagnosis not present

## 2020-06-22 DIAGNOSIS — G4710 Hypersomnia, unspecified: Secondary | ICD-10-CM | POA: Diagnosis present

## 2020-06-22 DIAGNOSIS — Z7951 Long term (current) use of inhaled steroids: Secondary | ICD-10-CM | POA: Diagnosis not present

## 2020-06-22 DIAGNOSIS — Y92018 Other place in single-family (private) house as the place of occurrence of the external cause: Secondary | ICD-10-CM

## 2020-06-22 DIAGNOSIS — S2241XA Multiple fractures of ribs, right side, initial encounter for closed fracture: Secondary | ICD-10-CM | POA: Diagnosis not present

## 2020-06-22 DIAGNOSIS — Z888 Allergy status to other drugs, medicaments and biological substances status: Secondary | ICD-10-CM

## 2020-06-22 DIAGNOSIS — Z20822 Contact with and (suspected) exposure to covid-19: Secondary | ICD-10-CM | POA: Diagnosis not present

## 2020-06-22 DIAGNOSIS — E860 Dehydration: Secondary | ICD-10-CM | POA: Diagnosis not present

## 2020-06-22 DIAGNOSIS — N179 Acute kidney failure, unspecified: Secondary | ICD-10-CM | POA: Diagnosis present

## 2020-06-22 DIAGNOSIS — G894 Chronic pain syndrome: Secondary | ICD-10-CM | POA: Diagnosis present

## 2020-06-22 DIAGNOSIS — N281 Cyst of kidney, acquired: Secondary | ICD-10-CM | POA: Diagnosis not present

## 2020-06-22 DIAGNOSIS — J984 Other disorders of lung: Secondary | ICD-10-CM | POA: Diagnosis not present

## 2020-06-22 DIAGNOSIS — F1910 Other psychoactive substance abuse, uncomplicated: Secondary | ICD-10-CM | POA: Diagnosis not present

## 2020-06-22 DIAGNOSIS — S2243XA Multiple fractures of ribs, bilateral, initial encounter for closed fracture: Secondary | ICD-10-CM | POA: Diagnosis not present

## 2020-06-22 DIAGNOSIS — I2510 Atherosclerotic heart disease of native coronary artery without angina pectoris: Secondary | ICD-10-CM | POA: Diagnosis not present

## 2020-06-22 DIAGNOSIS — S2249XA Multiple fractures of ribs, unspecified side, initial encounter for closed fracture: Secondary | ICD-10-CM | POA: Diagnosis not present

## 2020-06-22 DIAGNOSIS — Z87891 Personal history of nicotine dependence: Secondary | ICD-10-CM

## 2020-06-22 DIAGNOSIS — S2242XD Multiple fractures of ribs, left side, subsequent encounter for fracture with routine healing: Secondary | ICD-10-CM | POA: Diagnosis not present

## 2020-06-22 DIAGNOSIS — I5042 Chronic combined systolic (congestive) and diastolic (congestive) heart failure: Secondary | ICD-10-CM | POA: Diagnosis present

## 2020-06-22 DIAGNOSIS — J0100 Acute maxillary sinusitis, unspecified: Secondary | ICD-10-CM | POA: Diagnosis not present

## 2020-06-22 DIAGNOSIS — J9811 Atelectasis: Secondary | ICD-10-CM | POA: Diagnosis not present

## 2020-06-22 DIAGNOSIS — I10 Essential (primary) hypertension: Secondary | ICD-10-CM | POA: Diagnosis not present

## 2020-06-22 DIAGNOSIS — Z881 Allergy status to other antibiotic agents status: Secondary | ICD-10-CM

## 2020-06-23 ENCOUNTER — Emergency Department (HOSPITAL_COMMUNITY): Payer: PPO

## 2020-06-23 ENCOUNTER — Encounter (HOSPITAL_COMMUNITY): Payer: Self-pay | Admitting: Emergency Medicine

## 2020-06-23 ENCOUNTER — Observation Stay (HOSPITAL_COMMUNITY): Payer: PPO

## 2020-06-23 ENCOUNTER — Inpatient Hospital Stay (HOSPITAL_COMMUNITY)
Admission: EM | Admit: 2020-06-23 | Discharge: 2020-06-25 | DRG: 184 | Disposition: A | Discharge status: Home Health | Payer: PPO | Attending: Family Medicine | Admitting: Family Medicine

## 2020-06-23 ENCOUNTER — Other Ambulatory Visit: Payer: Self-pay

## 2020-06-23 DIAGNOSIS — F1910 Other psychoactive substance abuse, uncomplicated: Secondary | ICD-10-CM | POA: Diagnosis not present

## 2020-06-23 DIAGNOSIS — Z823 Family history of stroke: Secondary | ICD-10-CM | POA: Diagnosis not present

## 2020-06-23 DIAGNOSIS — I480 Paroxysmal atrial fibrillation: Secondary | ICD-10-CM | POA: Diagnosis present

## 2020-06-23 DIAGNOSIS — Z7982 Long term (current) use of aspirin: Secondary | ICD-10-CM | POA: Diagnosis not present

## 2020-06-23 DIAGNOSIS — S2249XA Multiple fractures of ribs, unspecified side, initial encounter for closed fracture: Secondary | ICD-10-CM | POA: Diagnosis present

## 2020-06-23 DIAGNOSIS — I2510 Atherosclerotic heart disease of native coronary artery without angina pectoris: Secondary | ICD-10-CM | POA: Diagnosis not present

## 2020-06-23 DIAGNOSIS — R0902 Hypoxemia: Secondary | ICD-10-CM | POA: Diagnosis present

## 2020-06-23 DIAGNOSIS — Z79891 Long term (current) use of opiate analgesic: Secondary | ICD-10-CM | POA: Diagnosis not present

## 2020-06-23 DIAGNOSIS — W010XXA Fall on same level from slipping, tripping and stumbling without subsequent striking against object, initial encounter: Secondary | ICD-10-CM | POA: Diagnosis present

## 2020-06-23 DIAGNOSIS — N179 Acute kidney failure, unspecified: Secondary | ICD-10-CM | POA: Diagnosis present

## 2020-06-23 DIAGNOSIS — I10 Essential (primary) hypertension: Secondary | ICD-10-CM | POA: Diagnosis not present

## 2020-06-23 DIAGNOSIS — S2241XA Multiple fractures of ribs, right side, initial encounter for closed fracture: Secondary | ICD-10-CM | POA: Diagnosis present

## 2020-06-23 DIAGNOSIS — G894 Chronic pain syndrome: Secondary | ICD-10-CM | POA: Diagnosis present

## 2020-06-23 DIAGNOSIS — Z7951 Long term (current) use of inhaled steroids: Secondary | ICD-10-CM | POA: Diagnosis not present

## 2020-06-23 DIAGNOSIS — G4710 Hypersomnia, unspecified: Secondary | ICD-10-CM | POA: Diagnosis present

## 2020-06-23 DIAGNOSIS — R296 Repeated falls: Secondary | ICD-10-CM | POA: Diagnosis present

## 2020-06-23 DIAGNOSIS — S2242XD Multiple fractures of ribs, left side, subsequent encounter for fracture with routine healing: Secondary | ICD-10-CM | POA: Diagnosis not present

## 2020-06-23 DIAGNOSIS — I77811 Abdominal aortic ectasia: Secondary | ICD-10-CM | POA: Diagnosis not present

## 2020-06-23 DIAGNOSIS — Z79899 Other long term (current) drug therapy: Secondary | ICD-10-CM | POA: Diagnosis not present

## 2020-06-23 DIAGNOSIS — S3991XA Unspecified injury of abdomen, initial encounter: Secondary | ICD-10-CM | POA: Diagnosis not present

## 2020-06-23 DIAGNOSIS — Z20822 Contact with and (suspected) exposure to covid-19: Secondary | ICD-10-CM | POA: Diagnosis present

## 2020-06-23 DIAGNOSIS — Z8249 Family history of ischemic heart disease and other diseases of the circulatory system: Secondary | ICD-10-CM | POA: Diagnosis not present

## 2020-06-23 DIAGNOSIS — M1990 Unspecified osteoarthritis, unspecified site: Secondary | ICD-10-CM | POA: Diagnosis present

## 2020-06-23 DIAGNOSIS — S2243XA Multiple fractures of ribs, bilateral, initial encounter for closed fracture: Secondary | ICD-10-CM | POA: Diagnosis not present

## 2020-06-23 DIAGNOSIS — J449 Chronic obstructive pulmonary disease, unspecified: Secondary | ICD-10-CM

## 2020-06-23 DIAGNOSIS — I110 Hypertensive heart disease with heart failure: Secondary | ICD-10-CM | POA: Diagnosis present

## 2020-06-23 DIAGNOSIS — Z87891 Personal history of nicotine dependence: Secondary | ICD-10-CM | POA: Diagnosis not present

## 2020-06-23 DIAGNOSIS — I6340 Cerebral infarction due to embolism of unspecified cerebral artery: Secondary | ICD-10-CM | POA: Diagnosis present

## 2020-06-23 DIAGNOSIS — N200 Calculus of kidney: Secondary | ICD-10-CM | POA: Diagnosis not present

## 2020-06-23 DIAGNOSIS — F319 Bipolar disorder, unspecified: Secondary | ICD-10-CM | POA: Diagnosis present

## 2020-06-23 DIAGNOSIS — N281 Cyst of kidney, acquired: Secondary | ICD-10-CM | POA: Diagnosis not present

## 2020-06-23 DIAGNOSIS — E860 Dehydration: Secondary | ICD-10-CM | POA: Diagnosis present

## 2020-06-23 DIAGNOSIS — Y92018 Other place in single-family (private) house as the place of occurrence of the external cause: Secondary | ICD-10-CM | POA: Diagnosis not present

## 2020-06-23 DIAGNOSIS — Z23 Encounter for immunization: Secondary | ICD-10-CM | POA: Diagnosis not present

## 2020-06-23 DIAGNOSIS — D649 Anemia, unspecified: Secondary | ICD-10-CM | POA: Diagnosis present

## 2020-06-23 DIAGNOSIS — I69398 Other sequelae of cerebral infarction: Secondary | ICD-10-CM | POA: Diagnosis not present

## 2020-06-23 DIAGNOSIS — I5042 Chronic combined systolic (congestive) and diastolic (congestive) heart failure: Secondary | ICD-10-CM | POA: Diagnosis present

## 2020-06-23 LAB — BASIC METABOLIC PANEL
Anion gap: 8 (ref 5–15)
BUN: 31 mg/dL — ABNORMAL HIGH (ref 8–23)
CO2: 25 mmol/L (ref 22–32)
Calcium: 9 mg/dL (ref 8.9–10.3)
Chloride: 108 mmol/L (ref 98–111)
Creatinine, Ser: 1.44 mg/dL — ABNORMAL HIGH (ref 0.61–1.24)
GFR, Estimated: 50 mL/min — ABNORMAL LOW (ref >60)
Glucose, Bld: 85 mg/dL (ref 70–99)
Potassium: 4.3 mmol/L (ref 3.5–5.1)
Sodium: 141 mmol/L (ref 135–145)

## 2020-06-23 LAB — CBC WITH DIFFERENTIAL/PLATELET
Abs Immature Granulocytes: 0.04 10*3/uL (ref 0.00–0.07)
Abs Immature Granulocytes: 0.04 10*3/uL (ref 0.00–0.07)
Basophils Absolute: 0.1 10*3/uL (ref 0.0–0.1)
Basophils Absolute: 0.1 10*3/uL (ref 0.0–0.1)
Basophils Relative: 1 %
Basophils Relative: 1 %
Eosinophils Absolute: 0.4 10*3/uL (ref 0.0–0.5)
Eosinophils Absolute: 0.4 10*3/uL (ref 0.0–0.5)
Eosinophils Relative: 4 %
Eosinophils Relative: 4 %
HCT: 43.7 % (ref 39.0–52.0)
HCT: 46 % (ref 39.0–52.0)
Hemoglobin: 12.9 g/dL — ABNORMAL LOW (ref 13.0–17.0)
Hemoglobin: 13.9 g/dL (ref 13.0–17.0)
Immature Granulocytes: 0 %
Immature Granulocytes: 0 %
Lymphocytes Relative: 21 %
Lymphocytes Relative: 21 %
Lymphs Abs: 2.1 10*3/uL (ref 0.7–4.0)
Lymphs Abs: 2 10*3/uL (ref 0.7–4.0)
MCH: 27.7 pg (ref 26.0–34.0)
MCH: 27.9 pg (ref 26.0–34.0)
MCHC: 29.5 g/dL — ABNORMAL LOW (ref 30.0–36.0)
MCHC: 30.2 g/dL (ref 30.0–36.0)
MCV: 93.8 fL (ref 80.0–100.0)
MCV: 92.4 fL (ref 80.0–100.0)
Monocytes Absolute: 1.6 10*3/uL — ABNORMAL HIGH (ref 0.1–1.0)
Monocytes Absolute: 1.6 10*3/uL — ABNORMAL HIGH (ref 0.1–1.0)
Monocytes Relative: 16 %
Monocytes Relative: 17 %
Neutro Abs: 5.7 10*3/uL (ref 1.7–7.7)
Neutro Abs: 5.3 10*3/uL (ref 1.7–7.7)
Neutrophils Relative %: 58 %
Neutrophils Relative %: 57 %
Platelets: 237 10*3/uL (ref 150–400)
Platelets: UNDETERMINED 10*3/uL (ref 150–400)
RBC: 4.66 MIL/uL (ref 4.22–5.81)
RBC: 4.98 MIL/uL (ref 4.22–5.81)
RDW: 14.4 % (ref 11.5–15.5)
RDW: 14.3 % (ref 11.5–15.5)
WBC: 9.9 10*3/uL (ref 4.0–10.5)
WBC: 9.4 10*3/uL (ref 4.0–10.5)
nRBC: 0 % (ref 0.0–0.2)
nRBC: 0 % (ref 0.0–0.2)

## 2020-06-23 LAB — RESPIRATORY PANEL BY RT PCR (FLU A&B, COVID)
Influenza A by PCR: NEGATIVE
Influenza B by PCR: NEGATIVE
SARS Coronavirus 2 by RT PCR: NEGATIVE

## 2020-06-23 LAB — COMPREHENSIVE METABOLIC PANEL
ALT: 80 U/L — ABNORMAL HIGH (ref 0–44)
ALT: 86 U/L — ABNORMAL HIGH (ref 0–44)
AST: 109 U/L — ABNORMAL HIGH (ref 15–41)
AST: 102 U/L — ABNORMAL HIGH (ref 15–41)
Albumin: 4 g/dL (ref 3.5–5.0)
Albumin: 3.8 g/dL (ref 3.5–5.0)
Alkaline Phosphatase: 105 U/L (ref 38–126)
Alkaline Phosphatase: 108 U/L (ref 38–126)
Anion gap: 9 (ref 5–15)
Anion gap: 8 (ref 5–15)
BUN: 29 mg/dL — ABNORMAL HIGH (ref 8–23)
BUN: 29 mg/dL — ABNORMAL HIGH (ref 8–23)
CO2: 22 mmol/L (ref 22–32)
CO2: 26 mmol/L (ref 22–32)
Calcium: 9.1 mg/dL (ref 8.9–10.3)
Calcium: 9.3 mg/dL (ref 8.9–10.3)
Chloride: 107 mmol/L (ref 98–111)
Chloride: 108 mmol/L (ref 98–111)
Creatinine, Ser: 1.5 mg/dL — ABNORMAL HIGH (ref 0.61–1.24)
Creatinine, Ser: 1.45 mg/dL — ABNORMAL HIGH (ref 0.61–1.24)
GFR, Estimated: 47 mL/min — ABNORMAL LOW (ref >60)
GFR, Estimated: 49 mL/min — ABNORMAL LOW (ref >60)
Glucose, Bld: 77 mg/dL (ref 70–99)
Glucose, Bld: 82 mg/dL (ref 70–99)
Potassium: 6 mmol/L — ABNORMAL HIGH (ref 3.5–5.1)
Potassium: 4.9 mmol/L (ref 3.5–5.1)
Sodium: 138 mmol/L (ref 135–145)
Sodium: 142 mmol/L (ref 135–145)
Total Bilirubin: 0.9 mg/dL (ref 0.3–1.2)
Total Bilirubin: 0.9 mg/dL (ref 0.3–1.2)
Total Protein: 7.1 g/dL (ref 6.5–8.1)
Total Protein: 7 g/dL (ref 6.5–8.1)

## 2020-06-23 LAB — BLOOD GAS, ARTERIAL
Acid-Base Excess: 0.5 mmol/L (ref 0.0–2.0)
Bicarbonate: 26.7 mmol/L (ref 20.0–28.0)
FIO2: 28
O2 Saturation: 98.3 %
Patient temperature: 98.6
pCO2 arterial: 52.9 mmHg — ABNORMAL HIGH (ref 32.0–48.0)
pH, Arterial: 7.323 — ABNORMAL LOW (ref 7.350–7.450)
pO2, Arterial: 110 mmHg — ABNORMAL HIGH (ref 83.0–108.0)

## 2020-06-23 LAB — ETHANOL: Alcohol, Ethyl (B): <10 mg/dL (ref <10)

## 2020-06-23 LAB — AMMONIA: Ammonia: 44 umol/L — ABNORMAL HIGH (ref 9–35)

## 2020-06-23 MED ORDER — FENTANYL CITRATE (PF) 100 MCG/2ML IJ SOLN
50.0000 ug | Freq: Once | INTRAMUSCULAR | Status: AC
  Administered 2020-06-23: 50 ug via INTRAVENOUS
  Filled 2020-06-23: qty 2

## 2020-06-23 MED ORDER — LIDOCAINE 5 % EX PTCH
1.0000 | MEDICATED_PATCH | CUTANEOUS | Status: DC
  Administered 2020-06-23 – 2020-06-25 (×3): 1 via TRANSDERMAL
  Filled 2020-06-23 (×2): qty 1

## 2020-06-23 MED ORDER — LOSARTAN POTASSIUM 50 MG PO TABS
100.0000 mg | ORAL_TABLET | Freq: Every day | ORAL | Status: DC
  Filled 2020-06-23: qty 2

## 2020-06-23 MED ORDER — OXYCODONE HCL ER 20 MG PO T12A: 20.0000 mg | EXTENDED_RELEASE_TABLET | Freq: Two times a day (BID) | ORAL | Status: DC

## 2020-06-23 MED ORDER — ADULT MULTIVITAMIN W/MINERALS CH
1.0000 | ORAL_TABLET | Freq: Every day | ORAL | Status: DC
  Administered 2020-06-23 – 2020-06-25 (×3): 1 via ORAL
  Filled 2020-06-23 (×3): qty 1

## 2020-06-23 MED ORDER — OXYCODONE HCL 5 MG PO TABS
5.0000 mg | ORAL_TABLET | Freq: Four times a day (QID) | ORAL | Status: DC | PRN
  Administered 2020-06-23 – 2020-06-24 (×2): 5 mg via ORAL
  Filled 2020-06-23 (×2): qty 1

## 2020-06-23 MED ORDER — POLYETHYLENE GLYCOL 3350 17 G PO PACK: 17.0000 g | PACK | Freq: Every day | ORAL | Status: DC | PRN

## 2020-06-23 MED ORDER — FLUOXETINE HCL 10 MG PO CAPS
10.0000 mg | ORAL_CAPSULE | Freq: Every day | ORAL | Status: DC
  Administered 2020-06-23 – 2020-06-25 (×3): 10 mg via ORAL
  Filled 2020-06-23 (×3): qty 1

## 2020-06-23 MED ORDER — AMIODARONE HCL 200 MG PO TABS
200.0000 mg | ORAL_TABLET | Freq: Every day | ORAL | Status: DC
  Administered 2020-06-23 – 2020-06-25 (×3): 200 mg via ORAL
  Filled 2020-06-23 (×4): qty 1

## 2020-06-23 MED ORDER — ACETAMINOPHEN 325 MG PO TABS
650.0000 mg | ORAL_TABLET | Freq: Four times a day (QID) | ORAL | Status: DC | PRN
  Administered 2020-06-24 – 2020-06-25 (×2): 650 mg via ORAL
  Filled 2020-06-23 (×2): qty 2

## 2020-06-23 MED ORDER — ACETAMINOPHEN 500 MG PO TABS: 1000.0000 mg | ORAL_TABLET | Freq: Once | ORAL | Status: DC

## 2020-06-23 MED ORDER — ARIPIPRAZOLE 10 MG PO TABS
10.0000 mg | ORAL_TABLET | Freq: Every day | ORAL | Status: DC
  Administered 2020-06-23 – 2020-06-25 (×3): 10 mg via ORAL
  Filled 2020-06-23 (×3): qty 1

## 2020-06-23 MED ORDER — LAMOTRIGINE 100 MG PO TABS
200.0000 mg | ORAL_TABLET | Freq: Every morning | ORAL | Status: DC
  Administered 2020-06-23 – 2020-06-25 (×3): 200 mg via ORAL
  Filled 2020-06-23 (×3): qty 2

## 2020-06-23 MED ORDER — METHOCARBAMOL 1000 MG/10ML IJ SOLN
500.0000 mg | Freq: Once | INTRAVENOUS | Status: DC
  Filled 2020-06-23: qty 5

## 2020-06-23 MED ORDER — FLUTICASONE PROPIONATE 50 MCG/ACT NA SUSP
2.0000 | NASAL | Status: DC | PRN
  Filled 2020-06-23: qty 16

## 2020-06-23 MED ORDER — HYDRALAZINE HCL 20 MG/ML IJ SOLN: 10.0000 mg | INTRAMUSCULAR | Status: DC | PRN

## 2020-06-23 MED ORDER — SODIUM CHLORIDE 0.9 % IV SOLN: INTRAVENOUS | Status: AC

## 2020-06-23 MED ORDER — UMECLIDINIUM-VILANTEROL 62.5-25 MCG/INH IN AEPB
1.0000 | INHALATION_SPRAY | Freq: Every day | RESPIRATORY_TRACT | Status: DC
  Administered 2020-06-25: 1 via RESPIRATORY_TRACT
  Filled 2020-06-23 (×2): qty 14

## 2020-06-23 MED ORDER — TRAZODONE HCL 50 MG PO TABS
50.0000 mg | ORAL_TABLET | Freq: Once | ORAL | Status: AC
  Administered 2020-06-23: 50 mg via ORAL
  Filled 2020-06-23: qty 1

## 2020-06-23 MED ORDER — IPRATROPIUM-ALBUTEROL 0.5-2.5 (3) MG/3ML IN SOLN: 3.0000 mL | RESPIRATORY_TRACT | Status: DC | PRN

## 2020-06-23 MED ORDER — CARVEDILOL 3.125 MG PO TABS
3.1250 mg | ORAL_TABLET | Freq: Two times a day (BID) | ORAL | Status: DC
  Administered 2020-06-23 – 2020-06-25 (×5): 3.125 mg via ORAL
  Filled 2020-06-23 (×5): qty 1

## 2020-06-23 MED ORDER — CLONAZEPAM 1 MG PO TABS: 1.0000 mg | ORAL_TABLET | Freq: Two times a day (BID) | ORAL | Status: DC

## 2020-06-23 MED ORDER — TAMSULOSIN HCL 0.4 MG PO CAPS
0.4000 mg | ORAL_CAPSULE | Freq: Every day | ORAL | Status: DC
  Administered 2020-06-23 – 2020-06-24 (×2): 0.4 mg via ORAL
  Filled 2020-06-23 (×2): qty 1

## 2020-06-23 MED ORDER — KETOROLAC TROMETHAMINE 30 MG/ML IJ SOLN
30.0000 mg | Freq: Four times a day (QID) | INTRAMUSCULAR | Status: AC
  Administered 2020-06-23 – 2020-06-24 (×5): 30 mg via INTRAVENOUS
  Filled 2020-06-23 (×5): qty 1

## 2020-06-23 MED ORDER — KETOROLAC TROMETHAMINE 15 MG/ML IJ SOLN
15.0000 mg | Freq: Once | INTRAMUSCULAR | Status: AC
  Administered 2020-06-23: 15 mg via INTRAVENOUS
  Filled 2020-06-23: qty 1

## 2020-06-23 MED ORDER — ATORVASTATIN CALCIUM 40 MG PO TABS
40.0000 mg | ORAL_TABLET | Freq: Every day | ORAL | Status: DC
  Administered 2020-06-23 – 2020-06-25 (×3): 40 mg via ORAL
  Filled 2020-06-23 (×3): qty 1

## 2020-06-23 MED ORDER — AMANTADINE HCL 100 MG PO CAPS
100.0000 mg | ORAL_CAPSULE | Freq: Two times a day (BID) | ORAL | Status: DC
  Administered 2020-06-23 – 2020-06-25 (×5): 100 mg via ORAL
  Filled 2020-06-23 (×5): qty 1

## 2020-06-23 MED ORDER — METHOCARBAMOL 1000 MG/10ML IJ SOLN: 500.0000 mg | Freq: Once | INTRAMUSCULAR | Status: DC

## 2020-06-23 MED ORDER — ALBUTEROL SULFATE HFA 108 (90 BASE) MCG/ACT IN AERS
2.0000 | INHALATION_SPRAY | Freq: Four times a day (QID) | RESPIRATORY_TRACT | Status: DC | PRN
  Filled 2020-06-23: qty 6.7

## 2020-06-23 MED ORDER — ASPIRIN EC 81 MG PO TBEC
81.0000 mg | DELAYED_RELEASE_TABLET | Freq: Every day | ORAL | Status: DC
  Administered 2020-06-23 – 2020-06-25 (×3): 81 mg via ORAL
  Filled 2020-06-23 (×3): qty 1

## 2020-06-23 MED ORDER — ENOXAPARIN SODIUM 40 MG/0.4ML ~~LOC~~ SOLN
40.0000 mg | SUBCUTANEOUS | Status: DC
  Administered 2020-06-23 – 2020-06-25 (×3): 40 mg via SUBCUTANEOUS
  Filled 2020-06-23 (×3): qty 0.4

## 2020-06-23 MED ORDER — ACETAMINOPHEN 650 MG RE SUPP: 650.0000 mg | Freq: Four times a day (QID) | RECTAL | Status: DC | PRN

## 2020-06-23 MED ORDER — TOPIRAMATE 25 MG PO TABS
25.0000 mg | ORAL_TABLET | Freq: Every day | ORAL | Status: DC
  Administered 2020-06-23 – 2020-06-25 (×3): 25 mg via ORAL
  Filled 2020-06-23 (×3): qty 1

## 2020-06-23 NOTE — TOC Progression Note (Signed)
Transition of Care [facility]) - Progression Note    Patient Details  Name: Travis Salinas MRN: [id number] Date of Birth: 11/04/1950  Transition of Care [facility]) CM/SW Contact  Purcell Mouton, RN Phone Number: 06/23/2020, 2:57 PM  Clinical Narrative:     Pt from home. TOC will continue to follow for discharge needs.   Expected Discharge Plan: [place] Barriers to Discharge: No Barriers Identified  Expected Discharge Plan and Services Expected Discharge Plan: [place] arrangements for the past 2 months: Single Family Home                                       Social Determinants of Health (SDOH) Interventions    Readmission Risk Interventions No flowsheet data found.

## 2020-06-23 NOTE — ED Triage Notes (Signed)
Patient is complaining of lower abdominal pain. Patient states that he fell out of the bed twice today and twice yesterday. Patient balance is off.

## 2020-06-23 NOTE — ED Provider Notes (Signed)
[facility] DEPT Provider Note   CSN: [id number] Arrival date & time: 06/22/20  2356     History Chief Complaint  Patient presents with  . Fall    Taysen Bushart St [name] is a 69 y.o. male presenting for evaluation of right-sided rib pain.  Patient states since he had a stroke about 6 months ago he has had intermittent issues with imbalance.  This is slowly improving, however he continues to have frequent falls.  Yesterday he fell when he was outside on the deck, landing on his right ribs.  Since then, he has had a lot of pain of his right ribs.  He also fell out of bed today.  He denies hitting his head or losing consciousness.  He is not on blood thinners other than aspirin.  He denies pain elsewhere including head, neck, abdomen.  He denies recent fevers, chills, cough, shortness of breath, nausea, vomiting, urinary symptoms, abnormal bowel movements.  Patient states he is taken some of his prescribed oxycodone for pain, unable to tell me how much or when his last dose was.  Additional history obtained by pt's acquaintance. She states pt is much more sleepy than usual.   Additional history obtained from chart review.  Patient with a history of COPD, hypertension, heart failure, embolic cerebral stroke, history of drug use including cocaine. I reviewed pt's recent visit with neuro, in which pt discussed frequent falls, which is improving.     HPI     Past Medical History:  Diagnosis Date  . Arthritis   . Cervical disc herniation   . COPD (chronic obstructive pulmonary disease) ([place])   . Headache    " due to antibiotics"  . Hepatitis C   . Hypertension     Patient Active Problem List   Diagnosis Date Noted  . Rib fractures 06/23/2020  . Chronic pain syndrome   . Hypoalbuminemia due to protein-calorie malnutrition ([place])   . Acute blood loss anemia   . Benign prostatic hyperplasia   . Vascular headache   . Acute on chronic combined systolic and  diastolic heart failure ([place])   . Sleep disturbance   . Embolic cerebral infarction ([place]) 01/06/2020  . Acute renal failure ([place])   . Left leg weakness   . Ventricular tachyarrhythmia ([place])   . Stroke (cerebrum) ([place]) 01/01/2020  . Normocytic anemia 06/04/2017  . COPD (chronic obstructive pulmonary disease) ([place]) 06/04/2017  . Pneumococcal bacteremia 05/28/2017  . Pneumococcal pneumonia ([place]) 05/27/2017  . IVDU (intravenous drug user)   . Liver fibrosis 04/24/2016  . Chronic hepatitis C without hepatic coma ([place]) 04/19/2015  . Substance abuse ([place]) 04/19/2015  . HTN (hypertension) 11/12/2013  . Depression 11/12/2013    Past Surgical History:  Procedure Laterality Date  . APPLICATION OF A-CELL OF EXTREMITY Left 12/05/2016   Procedure: APPLICATION OF A-CELL OF EXTREMITY;  Surgeon: Loel Lofty Dillingham, DO;  Location: [facility];  Service: Plastics;  Laterality: Left;  . APPLICATION OF WOUND VAC Left 12/05/2016   Procedure: APPLICATION OF WOUND VAC;  Surgeon: Loel Lofty Dillingham, DO;  Location: [facility];  Service: Plastics;  Laterality: Left;  . I & D EXTREMITY Left 11/03/2016   Procedure: IRRIGATION AND DEBRIDEMENT EXTREMITY;  Surgeon: Leandrew Koyanagi, MD;  Location: [facility];  Service: Orthopedics;  Laterality: Left;  . I & D EXTREMITY Left 12/05/2016   Procedure: IRRIGATION AND DEBRIDEMENT EXTREMITY;  Surgeon: Loel Lofty Dillingham, DO;  Location: [facility];  Service: Plastics;  Laterality:  Left;  . INCISION AND DRAINAGE ABSCESS Right 11/12/2013   Procedure: INCISION AND DRAINAGE AND OPEN PACKING OF RIGHT CALF  ABSCESS;  Surgeon: Earnstine Regal, MD;  Location: [facility];  Service: General;  Laterality: Right;  . INCISION AND DRAINAGE OF WOUND Left 01/29/2017   Procedure: IRRIGATION AND DEBRIDEMENT OF LEFT LEG WOUND WITH ABRA PLACEMENT AND PLACEMENT OF WOUND VAC;  Surgeon: Wallace Going, DO;  Location: [facility];  Service: Plastics;  Laterality: Left;  . KNEE SURGERY         Family History  Problem  Relation Age of Onset  . Heart disease Mother   . Stroke Mother   . Heart disease Father   . Stroke Father     Social History   Tobacco Use  . Smoking status: Former Smoker    Packs/day: 1.00    Years: 32.00    Pack years: 32.00    Types: Cigarettes    Quit date: 2002    Years since quitting: 19.8  . Smokeless tobacco: Never Used  Vaping Use  . Vaping Use: Never used  Substance Use Topics  . Alcohol use: No    Alcohol/week: 0.0 standard drinks  . Drug use: Not Currently    Types: Heroin, Cocaine    Comment: " relapsed once this year " 2018    Home Medications Prior to Admission medications   Medication Sig Start Date End Date Taking? Authorizing Provider  albuterol (VENTOLIN HFA) 108 (90 Base) MCG/ACT inhaler Inhale 2 puffs into the lungs every 6 (six) hours as needed for wheezing or shortness of breath. 01/11/20  Yes Angiulli, Lavon Paganini, PA-C  Amantadine HCl 100 MG tablet Take 100 mg by mouth 2 (two) times daily. 06/22/20  Yes [provider]  amiodarone (PACERONE) 200 MG tablet Take 1 tablet (200 mg total) by mouth daily. 01/19/20  Yes Nahser, Wonda Cheng, MD  ANORO ELLIPTA 62.5-25 MCG/INH AEPB INHALE 1 PUFF BY MOUTH INTO LUNGS DAILY Patient taking differently: Inhale 1 puff into the lungs daily.  05/16/20  Yes Brand Males, MD  ARIPiprazole (ABILIFY) 10 MG tablet Take 1 tablet (10 mg total) by mouth daily. 01/12/20  Yes Angiulli, Lavon Paganini, PA-C  aspirin EC 81 MG EC tablet Take 1 tablet (81 mg total) by mouth daily. 01/07/20  Yes Metzger-Cihelka, Desiree, NP  atorvastatin (LIPITOR) 40 MG tablet Take 1 tablet (40 mg total) by mouth daily. 01/19/20  Yes Nahser, Wonda Cheng, MD  carvedilol (COREG) 3.125 MG tablet Take 1 tablet (3.125 mg total) by mouth 2 (two) times daily with a meal. 01/25/20  Yes Nahser, Wonda Cheng, MD  clonazePAM (KLONOPIN) 1 MG tablet Take 1 tablet (1 mg total) by mouth 2 (two) times daily. 01/11/20  Yes Angiulli, Lavon Paganini, PA-C  FLUoxetine (PROZAC) 10 MG tablet  Take 1 tablet (10 mg total) by mouth daily. 01/11/20  Yes Angiulli, Lavon Paganini, PA-C  fluticasone (FLONASE) 50 MCG/ACT nasal spray Place 2 sprays into both nostrils as needed for allergies or rhinitis.    Yes [provider]  ipratropium-albuterol (DUONEB) 0.5-2.5 (3) MG/3ML SOLN Take 3 mLs by nebulization every 4 (four) hours as needed (sob).   Yes [provider]  lamoTRIgine (LAMICTAL) 200 MG tablet Take 1 tablet (200 mg total) by mouth every morning. 01/11/20  Yes Angiulli, Lavon Paganini, PA-C  losartan (COZAAR) 100 MG tablet Take 1 tablet (100 mg total) by mouth daily. 05/11/20  Yes Weaver, Scott T, PA-C  Multiple Vitamins-Minerals (MULTIVITAMIN WITH MINERALS)  tablet Take 1 tablet by mouth daily.   Yes [provider]  oxyCODONE (OXYCONTIN) 20 mg 12 hr tablet Take 1 tablet (20 mg total) by mouth every 12 (twelve) hours. 01/12/20  Yes Angiulli, Lavon Paganini, PA-C  polyethylene glycol (MIRALAX / GLYCOLAX) 17 g packet Take 17 g by mouth daily as needed for mild constipation.    Yes [provider]  silodosin (RAPAFLO) 8 MG CAPS capsule Take 1 capsule (8 mg total) by mouth daily with breakfast. 01/11/20  Yes Angiulli, Lavon Paganini, PA-C  topiramate (TOPAMAX) 25 MG tablet Take 25 mg by mouth daily.   Yes [provider]    Allergies    Propoxyphene, Amoxicillin, Ciprofloxacin, and Depakote [divalproex sodium]  Review of Systems   Review of Systems  Musculoskeletal:       R side rib paib  All other systems reviewed and are negative.   Physical Exam Updated Vital Signs BP (!) 113/59 (BP Location: Right Arm)   Pulse (!) 59   Temp 98 F (36.7 C) (Oral)   Resp 15   Ht 5\' 11"  (1.803 m)   Wt 68 kg   SpO2 100%   BMI 20.92 kg/m   Physical Exam Vitals and nursing note reviewed.  Constitutional:      General: He is not in acute distress.    Appearance: He is underweight.     Comments: Pt very droswy  HENT:     Head: Normocephalic and atraumatic.  Eyes:      Extraocular Movements: Extraocular movements intact.     Conjunctiva/sclera: Conjunctivae normal.     Pupils: Pupils are equal, round, and reactive to light.  Cardiovascular:     Rate and Rhythm: Normal rate and regular rhythm.     Pulses: Normal pulses.  Pulmonary:     Effort: Pulmonary effort is normal. No respiratory distress.     Breath sounds: Normal breath sounds. No wheezing.     Comments: Speaking in short sentences, however likely due to patient's drowsiness.  Sats between 89 and 93% on room air.  Clear lung sounds in all fields.  Patient is splinting slightly. TTP of the R side chest wall. No crepitus or deformity. Bruising noted over the R lower lateral ribs Chest:     Chest wall: Tenderness present.  Abdominal:     General: There is no distension.     Palpations: Abdomen is soft. There is no mass.     Tenderness: There is no abdominal tenderness. There is no guarding or rebound.  Musculoskeletal:        General: Normal range of motion.     Cervical back: Normal range of motion and neck supple.     Comments: No ttp of back or midline spine. No ttp of the pelvis or lower ext.   Skin:    General: Skin is warm and dry.     Capillary Refill: Capillary refill takes less than 2 seconds.  Neurological:     Mental Status: He is oriented to person, place, and time.     Comments: Drowsy, but oriented x3     ED Results / Procedures / Treatments   Labs (all labs ordered are listed, but only abnormal results are displayed) Labs Reviewed  CBC WITH DIFFERENTIAL/PLATELET - Abnormal; Notable for the following components:      Result Value   Hemoglobin 12.9 (*)    MCHC 29.5 (*)    Monocytes Absolute 1.6 (*)    All other components within  normal limits  COMPREHENSIVE METABOLIC PANEL - Abnormal; Notable for the following components:   Potassium 6.0 (*)    BUN 29 (*)    Creatinine, Ser 1.50 (*)    AST 109 (*)    ALT 80 (*)    GFR, Estimated 47 (*)    All other components within  normal limits  BASIC METABOLIC PANEL - Abnormal; Notable for the following components:   BUN 31 (*)    Creatinine, Ser 1.44 (*)    GFR, Estimated 50 (*)    All other components within normal limits  RESPIRATORY PANEL BY RT PCR (FLU A&B, COVID)  URINE CULTURE  ETHANOL  URINALYSIS, ROUTINE W REFLEX MICROSCOPIC  RAPID URINE DRUG SCREEN, HOSP PERFORMED    EKG EKG Interpretation  Date/Time:  Friday June 23 2020 00:39:26 EDT Ventricular Rate:  61 PR Interval:    QRS Duration: 112 QT Interval:  481 QTC Calculation: 485 R Axis:   -14 Text Interpretation: Sinus rhythm Probable left atrial enlargement Left ventricular hypertrophy Borderline prolonged QT interval NO STEMI. Confirmed by Addison Lank [phone]) on 06/23/2020 3:26:52 AM   Radiology DG Ribs Unilateral W/Chest Right  Result Date: 06/23/2020 CLINICAL DATA:  Fall, right rib pain EXAM: RIGHT RIBS AND CHEST - 3+ VIEW COMPARISON:  02/06/2020 FINDINGS: Lung volumes are small and pulmonary insufflation has diminished since prior examination. Minimal left basilar atelectasis. Interstitial changes noted throughout the lungs bilaterally likely related to interstitial thickening secondary to emphysema and parenchymal scarring noted on prior CT examination of 03/23/2019. There are acute fractures of the right ninth and tenth ribs laterally. No pneumothorax. No pleural effusion. Cardiac size within normal limits. IMPRESSION: Acute fracture of the right ninth and tenth ribs laterally. No pneumothorax. Electronically Signed   By: Fidela Salisbury MD   On: 06/23/2020 01:49   CT Head Wo Contrast  Result Date: 06/23/2020 CLINICAL DATA:  Multiple falls EXAM: CT HEAD WITHOUT CONTRAST TECHNIQUE: Contiguous axial images were obtained from the base of the skull through the vertex without intravenous contrast. COMPARISON:  None. FINDINGS: Brain: There is no mass, hemorrhage or extra-axial collection. The size and configuration of the ventricles and  extra-axial CSF spaces are normal. The brain parenchyma is normal, without acute or chronic infarction. Vascular: No abnormal hyperdensity of the major intracranial arteries or dural venous sinuses. No intracranial atherosclerosis. Skull: The visualized skull base, calvarium and extracranial soft tissues are normal. Sinuses/Orbits: Near complete opacification of the left maxillary, ethmoid and frontal sinuses. The orbits are normal. IMPRESSION: 1. No acute intracranial abnormality. 2. Near complete opacification of the left maxillary, ethmoid and frontal sinuses. Electronically Signed   By: Ulyses Jarred M.D.   On: 06/23/2020 01:55    Procedures Procedures (including critical care time)  Medications Ordered in ED Medications  lidocaine (LIDODERM) 5 % 1 patch (1 patch Transdermal Patch Applied 06/23/20 0248)  ketorolac (TORADOL) 15 MG/ML injection 15 mg (15 mg Intravenous Given 06/23/20 0248)  fentaNYL (SUBLIMAZE) injection 50 mcg (50 mcg Intravenous Given 06/23/20 0340)    ED Course  I have reviewed the triage vital signs and the nursing notes.  Pertinent labs & imaging results that were available during my care of the patient were reviewed by me and considered in my medical decision making (see chart for details).    MDM Rules/Calculators/A&P                          Patient presenting for evaluation of  right-sided rib pain after fall yesterday.  On exam, patient appears very drowsy.  He has tenderness palpation along the right lateral ribs, and bruising over the right lower ribs.  Concern for possible rib fracture.  However of note, patient is mildly hypoxic.  This could be due to his history of COPD, however also consider pneumothorax, pneumonia secondary to trauma, pulmonary contusion, PE, infection such as Covid.  Will obtain labs, chest x-ray, and Covid test.  Additionally, will obtain head CT due to drowsiness and patient's reported frequent falls.  Chest x-ray viewed interpreted by  me, shows fractures of the eighth and ninth rib.  No obvious lung injury, pneumothorax, hemothorax, or pulmonary contusion.  However this is concerning in the setting of hypoxia.  Patient with risk factors including COPD, as well severe pain.  CT head negative for acute findings.  Labs interpreted by me, overall reassuring.  Patient with mildly elevated creatinine at 1.5.  Patient's potassium is read as 6, however I suspect that there is a degree of hemolysis.  Will repeat BMP.  Covid test negative.  Discussed with attending, Dr. Leonette Monarch agrees to plan.  Due to patient's multiple rib fractures, hypoxia, and severe pain, will call for admission. Discussed with pt and pt's ex-wife, they are agreeable to admission.   Repeat BMP normal, as such first 1 was likely hemolysis.  Discussed with Dr. Hal Hope from [facility], patient to be admitted.  Final Clinical Impression(s) / ED Diagnoses Final diagnoses:  Closed fracture of multiple ribs of right side, initial encounter  Hypoxia  Chronic obstructive pulmonary disease, unspecified COPD type ([place])  Frequent falls    Rx / DC Orders ED Discharge Orders    None       Franchot Heidelberg, PA-C 06/23/20 0420    Fatima Blank, MD 06/23/20 [phone]

## 2020-06-23 NOTE — Progress Notes (Signed)
PT Cancellation Note  Patient Details Name: Travis Salinas MRN: [id number] DOB: Jul 08, 1951   Cancelled Treatment:    Reason Eval/Treat Not Completed: Medical issues which prohibited therapy  RN reports pt with poor cognition today and would likely better participate tomorrow.  Will check back as schedule permits.   Rynell Ciotti,KATHrine E 06/23/2020, 11:32 AM

## 2020-06-23 NOTE — Plan of Care (Signed)

## 2020-06-23 NOTE — H&P (Addendum)
History and Physical    Travis Salinas ACZ:[id number] DOB: 1951/04/05 DOA: 06/23/2020  PCP: Travis Lass, MD  Patient coming from: Home.  Chief Complaint: Fall.  History obtained from ER physician patient and patient's ex-wife.  HPI: Travis Salinas is a 69 y.o. male with history of stroke requiring TPA early part of this year around April, COPD, paroxysmal atrial fibrillation, Takotsubo cardiomyopathy with improved EF, hypertension was brought to the ER after patient had a fall at his home.  As per the patient's ex-wife patient was with his friend when he was walking he fell.  As per the report patient has been having imbalance after the stroke when he walks.  He did not lose consciousness as per the report.  Since the fall he has been having pain in the right rib areas.  ED Course: In the ER x-rays revealed rib fracture of the right ninth and 10th rib.  CT head is unremarkable.  EKG shows normal sinus rhythm.  Patient appears lethargic in the ER.  Labs show acute renal failure with creatinine of 1.5 hemoglobin of 12.9 Covid test was negative.  Patient admitted for rib fracture and lethargy with some hypoxia.  Review of Systems: As per HPI, rest all negative.   Past Medical History:  Diagnosis Date  . Arthritis   . Cervical disc herniation   . COPD (chronic obstructive pulmonary disease) ([place])   . Headache    " due to antibiotics"  . Hepatitis C   . Hypertension     Past Surgical History:  Procedure Laterality Date  . APPLICATION OF A-CELL OF EXTREMITY Left 12/05/2016   Procedure: APPLICATION OF A-CELL OF EXTREMITY;  Surgeon: Loel Lofty Dillingham, DO;  Location: [facility];  Service: Plastics;  Laterality: Left;  . APPLICATION OF WOUND VAC Left 12/05/2016   Procedure: APPLICATION OF WOUND VAC;  Surgeon: Loel Lofty Dillingham, DO;  Location: [facility];  Service: Plastics;  Laterality: Left;  . I & D EXTREMITY Left 11/03/2016   Procedure: IRRIGATION AND DEBRIDEMENT EXTREMITY;  Surgeon:  Leandrew Koyanagi, MD;  Location: [facility];  Service: Orthopedics;  Laterality: Left;  . I & D EXTREMITY Left 12/05/2016   Procedure: IRRIGATION AND DEBRIDEMENT EXTREMITY;  Surgeon: Loel Lofty Dillingham, DO;  Location: [facility] [facility];  Service: Plastics;  Laterality: Left;  . INCISION AND DRAINAGE ABSCESS Right 11/12/2013   Procedure: INCISION AND DRAINAGE AND OPEN PACKING OF RIGHT CALF  ABSCESS;  Surgeon: Earnstine Regal, MD;  Location: [facility];  Service: General;  Laterality: Right;  . INCISION AND DRAINAGE OF WOUND Left 01/29/2017   Procedure: IRRIGATION AND DEBRIDEMENT OF LEFT LEG WOUND WITH ABRA PLACEMENT AND PLACEMENT OF WOUND VAC;  Surgeon: Wallace Going, DO;  Location: [facility];  Service: Plastics;  Laterality: Left;  . KNEE SURGERY       reports that he quit smoking about 19 years ago. His smoking use included cigarettes. He has a 32.00 pack-year smoking history. He has never used smokeless tobacco. He reports previous drug use. Drugs: Heroin and Cocaine. He reports that he does not drink alcohol.  Allergies  Allergen Reactions  . Propoxyphene Nausea And Vomiting  . Amoxicillin Nausea And Vomiting  . Ciprofloxacin Nausea Only  . Depakote [Divalproex Sodium] Other (See Comments)    hallucinations    Family History  Problem Relation Age of Onset  . Heart disease Mother   . Stroke Mother   . Heart disease Father   . Stroke  Father     Prior to Admission medications   Medication Sig Start Date End Date Taking? Authorizing Provider  albuterol (VENTOLIN HFA) 108 (90 Base) MCG/ACT inhaler Inhale 2 puffs into the lungs every 6 (six) hours as needed for wheezing or shortness of breath. 01/11/20  Yes Angiulli, Lavon Paganini, PA-C  Amantadine HCl 100 MG tablet Take 100 mg by mouth 2 (two) times daily. 06/22/20  Yes [provider]  amiodarone (PACERONE) 200 MG tablet Take 1 tablet (200 mg total) by mouth daily. 01/19/20  Yes Nahser, Wonda Cheng, MD  ANORO ELLIPTA 62.5-25 MCG/INH AEPB INHALE 1 PUFF BY MOUTH  INTO LUNGS DAILY Patient taking differently: Inhale 1 puff into the lungs daily.  05/16/20  Yes Brand Males, MD  ARIPiprazole (ABILIFY) 10 MG tablet Take 1 tablet (10 mg total) by mouth daily. 01/12/20  Yes Angiulli, Lavon Paganini, PA-C  aspirin EC 81 MG EC tablet Take 1 tablet (81 mg total) by mouth daily. 01/07/20  Yes Metzger-Cihelka, Desiree, NP  atorvastatin (LIPITOR) 40 MG tablet Take 1 tablet (40 mg total) by mouth daily. 01/19/20  Yes Nahser, Wonda Cheng, MD  carvedilol (COREG) 3.125 MG tablet Take 1 tablet (3.125 mg total) by mouth 2 (two) times daily with a meal. 01/25/20  Yes Nahser, Wonda Cheng, MD  clonazePAM (KLONOPIN) 1 MG tablet Take 1 tablet (1 mg total) by mouth 2 (two) times daily. 01/11/20  Yes Angiulli, Lavon Paganini, PA-C  FLUoxetine (PROZAC) 10 MG tablet Take 1 tablet (10 mg total) by mouth daily. 01/11/20  Yes Angiulli, Lavon Paganini, PA-C  fluticasone (FLONASE) 50 MCG/ACT nasal spray Place 2 sprays into both nostrils as needed for allergies or rhinitis.    Yes [provider]  ipratropium-albuterol (DUONEB) 0.5-2.5 (3) MG/3ML SOLN Take 3 mLs by nebulization every 4 (four) hours as needed (sob).   Yes [provider]  lamoTRIgine (LAMICTAL) 200 MG tablet Take 1 tablet (200 mg total) by mouth every morning. 01/11/20  Yes Angiulli, Lavon Paganini, PA-C  losartan (COZAAR) 100 MG tablet Take 1 tablet (100 mg total) by mouth daily. 05/11/20  Yes Weaver, Scott T, PA-C  Multiple Vitamins-Minerals (MULTIVITAMIN WITH MINERALS) tablet Take 1 tablet by mouth daily.   Yes [provider]  oxyCODONE (OXYCONTIN) 20 mg 12 hr tablet Take 1 tablet (20 mg total) by mouth every 12 (twelve) hours. 01/12/20  Yes Angiulli, Lavon Paganini, PA-C  polyethylene glycol (MIRALAX / GLYCOLAX) 17 g packet Take 17 g by mouth daily as needed for mild constipation.    Yes [provider]  silodosin (RAPAFLO) 8 MG CAPS capsule Take 1 capsule (8 mg total) by mouth daily with breakfast. 01/11/20  Yes Angiulli, Lavon Paganini,  PA-C  topiramate (TOPAMAX) 25 MG tablet Take 25 mg by mouth daily.   Yes [provider]    Physical Exam: Constitutional: Moderately built and nourished. Vitals:   06/23/20 0023 06/23/20 0242 06/23/20 0442  BP: 108/63 (!) 113/59 128/62  Pulse: 71 (!) 59 61  Resp: 16 15 13   Temp: 98 F (36.7 C)    TempSrc: Oral    SpO2: 93% 100% 100%  Weight: 68 kg    Height: 5\' 11"  (1.803 m)     Eyes: Anicteric no pallor. ENMT: No discharge from the ears eyes nose or mouth. Neck: No mass felt.  No neck rigidity. Respiratory: No rhonchi or crepitations. Cardiovascular: S1-S2 heard. Abdomen: Soft nontender bowel sound present. Musculoskeletal: No edema. Skin: No rash. Neurologic: Patient appears mildly lethargic  but oriented to his name and place moving all extremities. Psychiatric: Appears lethargic.   Labs on Admission: I have personally reviewed following labs and imaging studies  CBC: Recent Labs  Lab 06/23/20 0119  WBC 9.9  NEUTROABS 5.7  HGB 12.9*  HCT 43.7  MCV 93.8  PLT 062   Basic Metabolic Panel: Recent Labs  Lab 06/23/20 0119 06/23/20 0323  NA 138 141  K 6.0* 4.3  CL 107 108  CO2 22 25  GLUCOSE 77 85  BUN 29* 31*  CREATININE 1.50* 1.44*  CALCIUM 9.1 9.0   GFR: Estimated Creatinine Clearance: 47.2 mL/min (A) (by C-G formula based on SCr of 1.44 mg/dL (H)). Liver Function Tests: Recent Labs  Lab 06/23/20 0119  AST 109*  ALT 80*  ALKPHOS 105  BILITOT 0.9  PROT 7.1  ALBUMIN 4.0   No results for input(s): LIPASE, AMYLASE in the last 168 hours. No results for input(s): AMMONIA in the last 168 hours. Coagulation Profile: No results for input(s): INR, PROTIME in the last 168 hours. Cardiac Enzymes: No results for input(s): CKTOTAL, CKMB, CKMBINDEX, TROPONINI in the last 168 hours. BNP (last 3 results) No results for input(s): PROBNP in the last 8760 hours. HbA1C: No results for input(s): HGBA1C in the last 72 hours. CBG: No results for  input(s): GLUCAP in the last 168 hours. Lipid Profile: No results for input(s): CHOL, HDL, LDLCALC, TRIG, CHOLHDL, LDLDIRECT in the last 72 hours. Thyroid Function Tests: No results for input(s): TSH, T4TOTAL, FREET4, T3FREE, THYROIDAB in the last 72 hours. Anemia Panel: No results for input(s): VITAMINB12, FOLATE, FERRITIN, TIBC, IRON, RETICCTPCT in the last 72 hours. Urine analysis:    Component Value Date/Time   COLORURINE YELLOW 06/14/2017 1211   APPEARANCEUR CLEAR 06/14/2017 1211   LABSPEC 1.014 06/14/2017 1211   PHURINE 6.0 06/14/2017 1211   GLUCOSEU NEGATIVE 06/14/2017 1211   HGBUR NEGATIVE 06/14/2017 1211   BILIRUBINUR NEGATIVE 06/14/2017 1211   KETONESUR NEGATIVE 06/14/2017 1211   PROTEINUR NEGATIVE 06/14/2017 1211   UROBILINOGEN 1.0 11/14/2013 1424   NITRITE NEGATIVE 06/14/2017 1211   LEUKOCYTESUR NEGATIVE 06/14/2017 1211   Sepsis Labs: @LABRCNTIP (procalcitonin:4,lacticidven:4) ) Recent Results (from the past 240 hour(s))  Respiratory Panel by RT PCR (Flu A&B, Covid) - Nasopharyngeal Swab     Status: None   Collection Time: 06/23/20  1:19 AM   Specimen: Nasopharyngeal Swab  Result Value Ref Range Status   SARS Coronavirus 2 by RT PCR NEGATIVE NEGATIVE Final    Comment: (NOTE) SARS-CoV-2 target nucleic acids are NOT DETECTED.  The SARS-CoV-2 RNA is generally detectable in upper respiratoy specimens during the acute phase of infection. The lowest concentration of SARS-CoV-2 viral copies this assay can detect is 131 copies/mL. A negative result does not preclude SARS-Cov-2 infection and should not be used as the sole basis for treatment or other patient management decisions. A negative result may occur with  improper specimen collection/handling, submission of specimen other than nasopharyngeal swab, presence of viral mutation(s) within the areas targeted by this assay, and inadequate number of viral copies (<131 copies/mL). A negative result must be combined with  clinical observations, patient history, and epidemiological information. The expected result is Negative.  Fact Sheet for Patients:  PinkCheek.be  Fact Sheet for Healthcare Providers:  GravelBags.it  This test is no t yet approved or cleared by the [place] FDA and  has been authorized for detection and/or diagnosis of SARS-CoV-2 by FDA under an Emergency Use Authorization (EUA). This EUA will remain  in effect (meaning this test can be used) for the duration of the COVID-19 declaration under Section 564(b)(1) of the Act, 21 U.S.C. section 360bbb-3(b)(1), unless the authorization is terminated or revoked sooner.     Influenza A by PCR NEGATIVE NEGATIVE Final   Influenza B by PCR NEGATIVE NEGATIVE Final    Comment: (NOTE) The Xpert Xpress SARS-CoV-2/FLU/RSV assay is intended as an aid in  the diagnosis of influenza from Nasopharyngeal swab specimens and  should not be used as a sole basis for treatment. Nasal washings and  aspirates are unacceptable for Xpert Xpress SARS-CoV-2/FLU/RSV  testing.  Fact Sheet for Patients: PinkCheek.be  Fact Sheet for Healthcare Providers: GravelBags.it  This test is not yet approved or cleared by the [place] FDA and  has been authorized for detection and/or diagnosis of SARS-CoV-2 by  FDA under an Emergency Use Authorization (EUA). This EUA will remain  in effect (meaning this test can be used) for the duration of the  Covid-19 declaration under Section 564(b)(1) of the Act, 21  U.S.C. section 360bbb-3(b)(1), unless the authorization is  terminated or revoked. Performed at [facility], [place] [address] [place], [place] [address]      Radiological Exams on Admission: DG Ribs Unilateral W/Chest Right  Result Date: 06/23/2020 CLINICAL DATA:  Fall, right rib pain EXAM: RIGHT RIBS AND CHEST - 3+ VIEW  COMPARISON:  02/06/2020 FINDINGS: Lung volumes are small and pulmonary insufflation has diminished since prior examination. Minimal left basilar atelectasis. Interstitial changes noted throughout the lungs bilaterally likely related to interstitial thickening secondary to emphysema and parenchymal scarring noted on prior CT examination of 03/23/2019. There are acute fractures of the right ninth and tenth ribs laterally. No pneumothorax. No pleural effusion. Cardiac size within normal limits. IMPRESSION: Acute fracture of the right ninth and tenth ribs laterally. No pneumothorax. Electronically Signed   By: Fidela Salisbury MD   On: 06/23/2020 01:49   CT Head Wo Contrast  Result Date: 06/23/2020 CLINICAL DATA:  Multiple falls EXAM: CT HEAD WITHOUT CONTRAST TECHNIQUE: Contiguous axial images were obtained from the base of the skull through the vertex without intravenous contrast. COMPARISON:  None. FINDINGS: Brain: There is no mass, hemorrhage or extra-axial collection. The size and configuration of the ventricles and extra-axial CSF spaces are normal. The brain parenchyma is normal, without acute or chronic infarction. Vascular: No abnormal hyperdensity of the major intracranial arteries or dural venous sinuses. No intracranial atherosclerosis. Skull: The visualized skull base, calvarium and extracranial soft tissues are normal. Sinuses/Orbits: Near complete opacification of the left maxillary, ethmoid and frontal sinuses. The orbits are normal. IMPRESSION: 1. No acute intracranial abnormality. 2. Near complete opacification of the left maxillary, ethmoid and frontal sinuses. Electronically Signed   By: Ulyses Jarred M.D.   On: 06/23/2020 01:55    EKG: Independently reviewed.  Normal sinus rhythm.  Assessment/Plan Principal Problem:   Rib fractures Active Problems:   HTN (hypertension)   Substance abuse (HCC)   COPD (chronic obstructive pulmonary disease) (HCC)   Embolic cerebral infarction ([place])     1. Right-sided hip fracture s/p fall for which patient has been placed on incentive spirometer.  Since patient is lethargic will avoid pain relief medications at this time.  May discuss with trauma team in the morning. 2. Lethargy -we will check an ABG ammonia levels avoid narcotics for now.  Patient takes chronic pain medications.  We will also hold off Klonopin for now.  Urine drug screen is pending. 3.  Acute renal failure will hold off patient's ARB gently hydrate. 4. Hypertension on ARB due to acute renal failure continue Coreg.  As needed await realizing. 5. Previous history of stroke presently on statins and aspirin. 6. History of bipolar disorder on Abilify Lamictal. 7. Chronic pain on OxyContin which I am holding for now since patient is lethargic. 8. Anemia follow CBC. 9. COPD not actively wheezing. 10. History of paroxysmal atrial fibrillation presently in sinus rhythm.  Patient is also on amiodarone. 11. History of Takotsubo's cardiomyopathy EF is improved since then.  Last EF in August 2021 was 60 to 65% with grade 1 diastolic dysfunction.  Patient's ABG ammonia level CT chest and abdomen are pending.  Note that since patient is lethargic I am holding off patient's Klonopin and OxyContin.  Which may need to be restarted soon since patient has been on these chronically.   DVT prophylaxis: Lovenox. Code Status: Full code. Family Communication: Discussed with patient's ex-wife. Disposition Plan: To be determined. Consults called: Physical therapy. Admission status: Observation.   Rise Patience MD [facility] Pager [phone].  If 7PM-7AM, please contact night-coverage [email or web address] Password [email or web address]  06/23/2020, 5:04 AM

## 2020-06-23 NOTE — ED Notes (Signed)
Patient transported to X-ray 

## 2020-06-23 NOTE — Progress Notes (Signed)
Subjective: Patient admitted this morning, see detailed H&P by Dr Hal Hope 69 year old male with a history of stroke requiring TPA around April this year, COPD, paroxysmal atrial fibrillation, Takotsubo cardiomyopathy with improved EF, hypertension was brought to the ER after fall at home.  X-rays showed rib fracture of ninth and 10th rib on right side.  Patient was lethargic.  He has been taking OxyContin 20 mg every 12 hours at home.  Vitals:   06/23/20 1314 06/23/20 1500  BP: (!) 145/70   Pulse: (!) 55   Resp: 18   Temp: (!) 97.5 F (36.4 C) 98 F (36.7 C)  SpO2: 95%       A/P Hypersomnolence Rib fractures Hypertension History of bipolar disorder Chronic pain syndrome  Hold OxyContin, will hold opiates at this time as patient is very lethargic and somnolent.  Will hold oxycodone and start Toradol 30 mg IV every 6 hours as needed for pain.  Hold Klonopin.  Ammonia level is 44.     [facility] [phone]

## 2020-06-24 DIAGNOSIS — I10 Essential (primary) hypertension: Secondary | ICD-10-CM

## 2020-06-24 DIAGNOSIS — F1910 Other psychoactive substance abuse, uncomplicated: Secondary | ICD-10-CM

## 2020-06-24 LAB — BASIC METABOLIC PANEL
Anion gap: 7 (ref 5–15)
BUN: 26 mg/dL — ABNORMAL HIGH (ref 8–23)
CO2: 24 mmol/L (ref 22–32)
Calcium: 8.6 mg/dL — ABNORMAL LOW (ref 8.9–10.3)
Chloride: 112 mmol/L — ABNORMAL HIGH (ref 98–111)
Creatinine, Ser: 1.25 mg/dL — ABNORMAL HIGH (ref 0.61–1.24)
GFR, Estimated: 59 mL/min — ABNORMAL LOW (ref >60)
Glucose, Bld: 85 mg/dL (ref 70–99)
Potassium: 4.4 mmol/L (ref 3.5–5.1)
Sodium: 143 mmol/L (ref 135–145)

## 2020-06-24 LAB — HIV ANTIBODY (ROUTINE TESTING W REFLEX): HIV Screen 4th Generation wRfx: NONREACTIVE

## 2020-06-24 MED ORDER — OXYCODONE HCL 5 MG PO TABS
5.0000 mg | ORAL_TABLET | Freq: Four times a day (QID) | ORAL | Status: DC | PRN
Start: 2020-06-24 — End: 2020-06-24

## 2020-06-24 MED ORDER — SALINE SPRAY 0.65 % NA SOLN
1.0000 | Freq: Two times a day (BID) | NASAL | Status: DC
  Administered 2020-06-24: 1 via NASAL
  Filled 2020-06-24: qty 44

## 2020-06-24 MED ORDER — OXYCODONE HCL 5 MG PO TABS
5.0000 mg | ORAL_TABLET | Freq: Four times a day (QID) | ORAL | Status: DC | PRN
  Administered 2020-06-24 (×2): 10 mg via ORAL
  Administered 2020-06-24: 5 mg via ORAL
  Administered 2020-06-25 (×2): 10 mg via ORAL
  Filled 2020-06-24 (×2): qty 2
  Filled 2020-06-24: qty 1
  Filled 2020-06-24 (×2): qty 2

## 2020-06-24 MED ORDER — AMLODIPINE BESYLATE 5 MG PO TABS
5.0000 mg | ORAL_TABLET | Freq: Every day | ORAL | Status: DC
  Administered 2020-06-24 – 2020-06-25 (×2): 5 mg via ORAL
  Filled 2020-06-24 (×2): qty 1

## 2020-06-24 NOTE — Progress Notes (Signed)
In agreement with previous RN's assessment, will continue to provide care as appropriate for situation.

## 2020-06-24 NOTE — Evaluation (Signed)
Physical Therapy Evaluation Patient Details Name: Travis Salinas MRN: [id number] DOB: 11-20-1950 Today's Date: 06/24/2020   History of Present Illness  69 y.o. male with history of stroke requiring TPA early part of this year around April, COPD, paroxysmal atrial fibrillation, Takotsubo cardiomyopathy, hypertension was brought to the ER after patient had a fall at his home.  As per the patient's ex-wife patient was with his friend when he was walking he fell.  As per the report patient has been having imbalance after the stroke when he walks. x-rays revealed rib fracture of the right ninth and 10th ribs  Clinical Impression  Pt admitted with above diagnosis.  Pt reports multiple falls at home, painful d/t rib fxs however quite agreeable to mobility. amb hallway distance with RW and min/guard assist. Recommend HHPT and pt would benefit from using his cane or RW at home   Pt currently with functional limitations due to the deficits listed below (see PT Problem List). Pt will benefit from skilled PT to increase their independence and safety with mobility to allow discharge to the venue listed below.       Follow Up Recommendations Home health PT    Equipment Recommendations  None recommended by PT    Recommendations for Other Services       Precautions / Restrictions Precautions Precautions: Fall Precaution Comments: has had multiple falls since CVA per pt report Restrictions Weight Bearing Restrictions: No      Mobility  Bed Mobility Overal bed mobility: Needs Assistance Bed Mobility: Supine to Sit     Supine to sit: Min guard;HOB elevated     General bed mobility comments: uses momentum to come to sit  Transfers Overall transfer level: Needs assistance Equipment used: Rolling walker (2 wheeled) Transfers: Sit to/from Stand Sit to Stand: Min guard;Min assist         General transfer comment: cues for hand placement, and safety  Ambulation/Gait Ambulation/Gait  assistance: Min guard Gait Distance (Feet): 130 Feet Assistive device: Rolling walker (2 wheeled) Gait Pattern/deviations: Step-through pattern;Decreased stride length;Drifts right/left     General Gait Details: min/guard for safety, unsteady gait,  no overt LOB. amb short distance in room without device, tends to hold onto objects in room--> furniture walk  Stairs            Wheelchair Mobility    Modified Rankin (Stroke Patients Only)       Balance Overall balance assessment: Needs assistance Sitting-balance support: Feet supported;No upper extremity supported Sitting balance-Leahy Scale: Fair       Standing balance-Leahy Scale: Fair Standing balance comment: static stand with supervision, able to wash hands without UE support                             Pertinent Vitals/Pain Pain Assessment: Faces Faces Pain Scale: Hurts even more Pain Location: R ribs with movement Pain Descriptors / Indicators: Grimacing;Sore Pain Intervention(s): Repositioned;Monitored during session;Limited activity within patient's tolerance    Home Living Family/patient expects to be discharged to:: Private residence Living Arrangements:  (roommate)   Type of Home: Other(Comment) (condo)   Entrance Stairs-Rails: Right;Left;Can reach both Entrance Stairs-Number of Steps: 3 flights (per pt ex wife) Home Layout: One level Home Equipment: Walker - 2 wheels;Kasandra Knudsen - single point Additional Comments: ex wife on the phone with RN and provided above information since pt has limited cognition - 06/23/20 (some infor taken from pt today)    Prior  Function Level of Independence: Independent         Comments: pt reports he falls a lot, does not use his cane or RW     Hand Dominance        Extremity/Trunk Assessment   Upper Extremity Assessment Upper Extremity Assessment: Defer to OT evaluation    Lower Extremity Assessment Lower Extremity Assessment: Overall WFL for tasks  assessed       Communication   Communication: HOH  Cognition Arousal/Alertness: Awake/alert Behavior During Therapy: WFL for tasks assessed/performed Overall Cognitive Status: Within Functional Limits for tasks assessed                                 General Comments: pt alert and oriented x3 today, follows commands consistently      General Comments      Exercises     Assessment/Plan    PT Assessment Patient needs continued PT services  PT Problem List Decreased strength;Decreased activity tolerance;Decreased balance;Decreased knowledge of use of DME;Pain;Decreased mobility       PT Treatment Interventions DME instruction;Therapeutic exercise;Gait training;Functional mobility training;Therapeutic activities;Patient/family education;Balance training    PT Goals (Current goals can be found in the Care Plan section)  Acute Rehab PT Goals Patient Stated Goal: home tomorrow PT Goal Formulation: With patient Time For Goal Achievement: 07/01/20 Potential to Achieve Goals: Good    Frequency Min 3X/week   Barriers to discharge        Co-evaluation               AM-PAC PT "6 Clicks" Mobility  Outcome Measure Help needed turning from your back to your side while in a flat bed without using bedrails?: None Help needed moving from lying on your back to sitting on the side of a flat bed without using bedrails?: A Little Help needed moving to and from a bed to a chair (including a wheelchair)?: A Little Help needed standing up from a chair using your arms (e.g., wheelchair or bedside chair)?: A Little Help needed to walk in hospital room?: A Little Help needed climbing 3-5 steps with a railing? : A Little 6 Click Score: 19    End of Session Equipment Utilized During Treatment: Gait belt Activity Tolerance: Patient tolerated treatment well Patient left: with call bell/phone within reach;in chair;with chair alarm set   PT Visit Diagnosis: Difficulty in  walking, not elsewhere classified (R26.2)    Time: 6045-4098 PT Time Calculation (min) (ACUTE ONLY): 19 min   Charges:   PT Evaluation $PT Eval Low Complexity: [place], PT  Acute Rehab Dept ([place]) [phone] Pager [phone]  06/24/2020   [facility] 06/24/2020, 12:47 PM

## 2020-06-24 NOTE — Progress Notes (Signed)
Patient requesting Ace Wrap for ribs, states that it hurt worse when he was attempting to use complete a bowel movement and pressure against his ribs did assist in alleviating that pain.

## 2020-06-24 NOTE — Progress Notes (Signed)
Patient being transferred to room 1415 for hospital management.  Patient informed and okay with move.  No c/o pain voiced at this time.  Report given to [facility].

## 2020-06-24 NOTE — Progress Notes (Signed)
[facility]  PROGRESS NOTE  Travis [place]inas UTM:[id number] DOB: Jan 17, 1951 DOA: 06/23/2020 PCP: Kathyrn Lass, MD   Brief HPI:   69 year old male with a history of stroke requiring TPA around April this year, COPD, paroxysmal atrial fibrillation, Takotsubo cardiomyopathy with improved EF, hypertension was brought to the ER after fall at home.  X-rays showed rib fracture of ninth and 10th rib on right side.  Patient was lethargic.  He has been taking OxyContin 20 mg every 12 hours at home.    Subjective   Patient seen and examined, continues to complain of rib pain.   Assessment/Plan:     1. Right sided rib fracture-s/p fall, patient placed on incentive spirometry.  Continue pain control with oxycodone, Toradol.  Patient is also on lidocaine patch 5% every 24 hours.  Will increase the dose of oxycodone to 1 to 2 tablets every 4 hours as needed.  Called and discussed with trauma surgeon Dr. Redmond Pulling, he recommends continue above treatment. PT has already seen the patient and recommend home health PT. 2. Hypersomnolence-patient was very somnolent when he arrived in the ED.  OxyContin was held.  At this time he is back to baseline.  Continue to hold OxyContin and started on oxycodone as above. 3. Acute kidney injury-patient baseline creatinine is around 1.2, he presented with creatinine 1.5 likely from poor p.o. intake dehydration.  It has improved to 1.25. 4. Hypertension-blood pressure is mild elevated, continue Coreg, losartan is on hold due to kidney injury as above.  Will start amlodipine 5 mg daily. 5. History of bipolar disorder-continue Abilify,Lamictal. 6. History of paroxysmal atrial fibrillation-continue amiodarone 7. History of stroke-continue statin, aspirin     COVID-19 Labs  No results for input(s): DDIMER, FERRITIN, LDH, CRP in the last 72 hours.  Lab Results  Component Value Date   SARSCOV2NAA NEGATIVE 06/23/2020   Roy NEGATIVE 01/02/2020      Scheduled medications:   . amantadine  100 mg Oral BID  . amiodarone  200 mg Oral Daily  . ARIPiprazole  10 mg Oral Daily  . aspirin EC  81 mg Oral Daily  . atorvastatin  40 mg Oral Daily  . carvedilol  3.125 mg Oral BID WC  . enoxaparin (LOVENOX) injection  40 mg Subcutaneous Q24H  . FLUoxetine  10 mg Oral Daily  . lamoTRIgine  200 mg Oral q morning - 10a  . lidocaine  1 patch Transdermal Q24H  . multivitamin with minerals  1 tablet Oral Daily  . sodium chloride  1 spray Each Nare BID  . tamsulosin  0.4 mg Oral QPC supper  . topiramate  25 mg Oral Daily  . umeclidinium-vilanterol  1 puff Inhalation Daily         CBG: No results for input(s): GLUCAP in the last 168 hours.  SpO2: 97 % O2 Flow Rate (L/min): 2 L/min    CBC: Recent Labs  Lab 06/23/20 0119 06/23/20 0736  WBC 9.9 9.4  NEUTROABS 5.7 5.3  HGB 12.9* 13.9  HCT 43.7 46.0  MCV 93.8 92.4  PLT 237 PLATELET CLUMPS NOTED ON SMEAR, UNABLE TO ESTIMATE    Basic Metabolic Panel: Recent Labs  Lab 06/23/20 0119 06/23/20 0323 06/23/20 0736 06/24/20 0758  NA 138 141 142 143  K 6.0* 4.3 4.9 4.4  CL 107 108 108 112*  CO2 22 25 26 24   GLUCOSE 77 85 82 85  BUN 29* 31* 29* 26*  CREATININE 1.50* 1.44* 1.45* 1.25*  CALCIUM 9.1 9.0 9.3  8.6*     Liver Function Tests: Recent Labs  Lab 06/23/20 0119 06/23/20 0736  AST 109* 102*  ALT 80* 86*  ALKPHOS 105 108  BILITOT 0.9 0.9  PROT 7.1 7.0  ALBUMIN 4.0 3.8     Antibiotics: Anti-infectives (From admission, onward)   None       DVT prophylaxis: Lovenox  Code Status: Full code  Family Communication: No family at bedside    Status is: Inpatient  Dispo: The patient is from: Home              Anticipated d/c is to: Home              Anticipated d/c date is: 06/25/2020              Patient currently not medically stable for discharge  Barrier to discharge-ongoing pain management           Consultants:    Procedures:     Objective   Vitals:   06/23/20 2113 06/24/20 0340 06/24/20 0800 06/24/20 1300  BP: 113/61 (!) 147/56  (!) 150/54  Pulse: 71 (!) 55  (!) 53  Resp: 18 16 (!) 21 16  Temp: 97.6 F (36.4 C) 98.6 F (37 C)  98 F (36.7 C)  TempSrc: Oral Oral  Oral  SpO2: 90% 95%  97%  Weight:      Height:        Intake/Output Summary (Last 24 hours) at 06/24/2020 1712 Last data filed at 06/24/2020 1125 Gross per 24 hour  Intake 1847.54 ml  Output 1225 ml  Net 622.54 ml    10/14 1901 - 10/16 0700 In: 2086.4 [P.O.:840; I.V.:1246.4] Out: 1050 [Urine:1050]  Filed Weights   06/23/20 0023  Weight: 68 kg    Physical Examination:    General: Appears in no acute distress  Cardiovascular: S1-S2, regular, no murmur auscultated  Respiratory: Clear to auscultation bilaterally  Abdomen: Abdomen is soft, nontender, no organomegaly  Extremities: No edema in the lower extremities  Neurologic: Alert, oriented x3, intact insight and judgment    Data Reviewed:   Recent Results (from the past 240 hour(s))  Respiratory Panel by RT PCR (Flu A&B, Covid) - Nasopharyngeal Swab     Status: None   Collection Time: 06/23/20  1:19 AM   Specimen: Nasopharyngeal Swab  Result Value Ref Range Status   SARS Coronavirus 2 by RT PCR NEGATIVE NEGATIVE Final    Comment: (NOTE) SARS-CoV-2 target nucleic acids are NOT DETECTED.  The SARS-CoV-2 RNA is generally detectable in upper respiratoy specimens during the acute phase of infection. The lowest concentration of SARS-CoV-2 viral copies this assay can detect is 131 copies/mL. A negative result does not preclude SARS-Cov-2 infection and should not be used as the sole basis for treatment or other patient management decisions. A negative result may occur with  improper specimen collection/handling, submission of specimen other than nasopharyngeal swab, presence of viral mutation(s) within the areas  targeted by this assay, and inadequate number of viral copies (<131 copies/mL). A negative result must be combined with clinical observations, patient history, and epidemiological information. The expected result is Negative.  Fact Sheet for Patients:  PinkCheek.be  Fact Sheet for Healthcare Providers:  GravelBags.it  This test is no t yet approved or cleared by the [place] FDA and  has been authorized for detection and/or diagnosis of SARS-CoV-2 by FDA under an Emergency Use Authorization (EUA). This EUA will remain  in effect (meaning this test can be  used) for the duration of the COVID-19 declaration under Section 564(b)(1) of the Act, 21 U.S.C. section 360bbb-3(b)(1), unless the authorization is terminated or revoked sooner.     Influenza A by PCR NEGATIVE NEGATIVE Final   Influenza B by PCR NEGATIVE NEGATIVE Final    Comment: (NOTE) The Xpert Xpress SARS-CoV-2/FLU/RSV assay is intended as an aid in  the diagnosis of influenza from Nasopharyngeal swab specimens and  should not be used as a sole basis for treatment. Nasal washings and  aspirates are unacceptable for Xpert Xpress SARS-CoV-2/FLU/RSV  testing.  Fact Sheet for Patients: PinkCheek.be  Fact Sheet for Healthcare Providers: GravelBags.it  This test is not yet approved or cleared by the [place] FDA and  has been authorized for detection and/or diagnosis of SARS-CoV-2 by  FDA under an Emergency Use Authorization (EUA). This EUA will remain  in effect (meaning this test can be used) for the duration of the  Covid-19 declaration under Section 564(b)(1) of the Act, 21  U.S.C. section 360bbb-3(b)(1), unless the authorization is  terminated or revoked. Performed at [facility] [place] [address], [place], [place] [address]     No results for input(s): LIPASE, AMYLASE in the  last 168 hours. Recent Labs  Lab 06/23/20 0736  AMMONIA 44*    Studies:  CT ABDOMEN PELVIS WO CONTRAST  Result Date: 06/23/2020 CLINICAL DATA:  Fall, right rib pain EXAM: CT CHEST, ABDOMEN AND PELVIS WITHOUT CONTRAST TECHNIQUE: Multidetector CT imaging of the chest, abdomen and pelvis was performed following the standard protocol without IV contrast. COMPARISON:  CT chest dated 03/23/2019 FINDINGS: CT CHEST FINDINGS Cardiovascular: Heart is normal in size.  No pericardial effusion. No evidence of thoracic aortic aneurysm. Atherosclerotic calcifications of the aortic arch. Three vessel coronary atherosclerosis. Mediastinum/Nodes: 9 mm short axis right azygoesophageal recess node, likely reactive. Visualized thyroid is unremarkable. Lungs/Pleura: Evaluation of the lung parenchyma is constrained by respiratory motion. Within that constraint, there are no suspicious pulmonary nodules. Mild patchy bilateral lower lobe opacities, likely atelectasis, right greater than left. Mild right lower lobe infection/pneumonia is possible (series 6/image 109) but is not favored. Moderate centrilobular and paraseptal emphysematous changes, upper lobe predominant. Superimposed scarring/fibrosis in the bilateral upper lobes, favored to be post infectious/inflammatory. No pleural effusion or pneumothorax. Musculoskeletal: Nondisplaced right posterolateral 8th through 10th rib fractures (series 2/images 56 and 61). Healing left lateral 6th and 7th rib fractures (series 2/images 39 and 46). Sternum, clavicles, scapulae, and thoracic spine are intact. CT ABDOMEN PELVIS FINDINGS Hepatobiliary: Unenhanced liver is unremarkable. No perihepatic fluid/hemorrhage. Gallbladder is unremarkable. No intrahepatic or extrahepatic ductal dilatation. Pancreas: Within normal limits. Spleen: Within normal limits.  No perisplenic fluid/hemorrhage. Adrenals/Urinary Tract: Adrenal glands are within normal limits. 13 mm lesion along the posterior  left lower kidney which cannot be characterized as a simple cyst (series 2/image 77). 6 mm left lower pole renal calculus (series 2/image 80). Punctate nonobstructing right upper pole renal calculus (series 2/image 34). No hydronephrosis. Bladder is within normal limits. Stomach/Bowel: Stomach is within normal limits. No evidence of bowel obstruction. Normal appendix (series 2/image 103). Vascular/Lymphatic: Ectasia of the infrarenal abdominal aorta, measuring 2.8 cm (series 2/image 90). No evidence of abdominal aortic aneurysm. Atherosclerotic calcifications of the abdominal aorta and branch vessels. No suspicious abdominopelvic lymphadenopathy. Reproductive: Prostate is unremarkable. Other: No abdominopelvic ascites. No hemoperitoneum or free air. Musculoskeletal: Visualized osseous structures are within normal limits. No fracture is seen. Lumbar spine and bilateral pelvis are intact. IMPRESSION: Nondisplaced right posterolateral  8th through 10th rib fractures. Old/healing left lateral 6th and 7th rib fractures. No evidence of traumatic injury to the abdomen/pelvis. 13 mm lesion in the posterior left lower kidney which cannot be characterized as a simple cyst. Differential considerations include hemorrhagic cyst (statistically favored) versus small solid renal neoplasm. Given the small size, follow-up MRI abdomen with/without contrast is suggested in 6 months. Please note that inpatient MR evaluation is unlikely to be satisfactory for further characterization due to motion degradation. Electronically Signed   By: Julian Hy M.D.   On: 06/23/2020 09:08   DG Ribs Unilateral W/Chest Right  Result Date: 06/23/2020 CLINICAL DATA:  Fall, right rib pain EXAM: RIGHT RIBS AND CHEST - 3+ VIEW COMPARISON:  02/06/2020 FINDINGS: Lung volumes are small and pulmonary insufflation has diminished since prior examination. Minimal left basilar atelectasis. Interstitial changes noted throughout the lungs bilaterally  likely related to interstitial thickening secondary to emphysema and parenchymal scarring noted on prior CT examination of 03/23/2019. There are acute fractures of the right ninth and tenth ribs laterally. No pneumothorax. No pleural effusion. Cardiac size within normal limits. IMPRESSION: Acute fracture of the right ninth and tenth ribs laterally. No pneumothorax. Electronically Signed   By: Fidela [place] MD   On: 06/23/2020 01:49   CT Head Wo Contrast  Result Date: 06/23/2020 CLINICAL DATA:  Multiple falls EXAM: CT HEAD WITHOUT CONTRAST TECHNIQUE: Contiguous axial images were obtained from the base of the skull through the vertex without intravenous contrast. COMPARISON:  None. FINDINGS: Brain: There is no mass, hemorrhage or extra-axial collection. The size and configuration of the ventricles and extra-axial CSF spaces are normal. The brain parenchyma is normal, without acute or chronic infarction. Vascular: No abnormal hyperdensity of the major intracranial arteries or dural venous sinuses. No intracranial atherosclerosis. Skull: The visualized skull base, calvarium and extracranial soft tissues are normal. Sinuses/Orbits: Near complete opacification of the left maxillary, ethmoid and frontal sinuses. The orbits are normal. IMPRESSION: 1. No acute intracranial abnormality. 2. Near complete opacification of the left maxillary, ethmoid and frontal sinuses. Electronically Signed   By: Ulyses Jarred M.D.   On: 06/23/2020 01:55   CT CHEST WO CONTRAST  Result Date: 06/23/2020 CLINICAL DATA:  Fall, right rib pain EXAM: CT CHEST, ABDOMEN AND PELVIS WITHOUT CONTRAST TECHNIQUE: Multidetector CT imaging of the chest, abdomen and pelvis was performed following the standard protocol without IV contrast. COMPARISON:  CT chest dated 03/23/2019 FINDINGS: CT CHEST FINDINGS Cardiovascular: Heart is normal in size.  No pericardial effusion. No evidence of thoracic aortic aneurysm. Atherosclerotic calcifications of the  aortic arch. Three vessel coronary atherosclerosis. Mediastinum/Nodes: 9 mm short axis right azygoesophageal recess node, likely reactive. Visualized thyroid is unremarkable. Lungs/Pleura: Evaluation of the lung parenchyma is constrained by respiratory motion. Within that constraint, there are no suspicious pulmonary nodules. Mild patchy bilateral lower lobe opacities, likely atelectasis, right greater than left. Mild right lower lobe infection/pneumonia is possible (series 6/image 109) but is not favored. Moderate centrilobular and paraseptal emphysematous changes, upper lobe predominant. Superimposed scarring/fibrosis in the bilateral upper lobes, favored to be post infectious/inflammatory. No pleural effusion or pneumothorax. Musculoskeletal: Nondisplaced right posterolateral 8th through 10th rib fractures (series 2/images 56 and 61). Healing left lateral 6th and 7th rib fractures (series 2/images 39 and 46). Sternum, clavicles, scapulae, and thoracic spine are intact. CT ABDOMEN PELVIS FINDINGS Hepatobiliary: Unenhanced liver is unremarkable. No perihepatic fluid/hemorrhage. Gallbladder is unremarkable. No intrahepatic or extrahepatic ductal dilatation. Pancreas: Within normal limits. Spleen: Within normal limits.  No perisplenic fluid/hemorrhage. Adrenals/Urinary Tract: Adrenal glands are within normal limits. 13 mm lesion along the posterior left lower kidney which cannot be characterized as a simple cyst (series 2/image 77). 6 mm left lower pole renal calculus (series 2/image 80). Punctate nonobstructing right upper pole renal calculus (series 2/image 34). No hydronephrosis. Bladder is within normal limits. Stomach/Bowel: Stomach is within normal limits. No evidence of bowel obstruction. Normal appendix (series 2/image 103). Vascular/Lymphatic: Ectasia of the infrarenal abdominal aorta, measuring 2.8 cm (series 2/image 90). No evidence of abdominal aortic aneurysm. Atherosclerotic calcifications of the  abdominal aorta and branch vessels. No suspicious abdominopelvic lymphadenopathy. Reproductive: Prostate is unremarkable. Other: No abdominopelvic ascites. No hemoperitoneum or free air. Musculoskeletal: Visualized osseous structures are within normal limits. No fracture is seen. Lumbar spine and bilateral pelvis are intact. IMPRESSION: Nondisplaced right posterolateral 8th through 10th rib fractures. Old/healing left lateral 6th and 7th rib fractures. No evidence of traumatic injury to the abdomen/pelvis. 13 mm lesion in the posterior left lower kidney which cannot be characterized as a simple cyst. Differential considerations include hemorrhagic cyst (statistically favored) versus small solid renal neoplasm. Given the small size, follow-up MRI abdomen with/without contrast is suggested in 6 months. Please note that inpatient MR evaluation is unlikely to be satisfactory for further characterization due to motion degradation. Electronically Signed   By: Julian Hy M.D.   On: 06/23/2020 09:08       [place]   [facility] If 7PM-7AM, please contact night-coverage at [email or web address], Office  [phone]   06/24/2020, 5:12 PM  LOS: 1 day

## 2020-06-25 DIAGNOSIS — R296 Repeated falls: Secondary | ICD-10-CM

## 2020-06-25 LAB — BASIC METABOLIC PANEL
Anion gap: 10 (ref 5–15)
BUN: 22 mg/dL (ref 8–23)
CO2: 20 mmol/L — ABNORMAL LOW (ref 22–32)
Calcium: 8.8 mg/dL — ABNORMAL LOW (ref 8.9–10.3)
Chloride: 110 mmol/L (ref 98–111)
Creatinine, Ser: 1.25 mg/dL — ABNORMAL HIGH (ref 0.61–1.24)
GFR, Estimated: 59 mL/min — ABNORMAL LOW (ref >60)
Glucose, Bld: 118 mg/dL — ABNORMAL HIGH (ref 70–99)
Potassium: 3.6 mmol/L (ref 3.5–5.1)
Sodium: 140 mmol/L (ref 135–145)

## 2020-06-25 MED ORDER — AMLODIPINE BESYLATE 10 MG PO TABS
10.0000 mg | ORAL_TABLET | Freq: Every day | ORAL | 3 refills | Status: DC
Start: 2020-06-26 — End: 2020-07-28

## 2020-06-25 MED ORDER — INFLUENZA VAC A&B SA ADJ QUAD 0.5 ML IM PRSY: 0.5000 mL | PREFILLED_SYRINGE | INTRAMUSCULAR | Status: DC

## 2020-06-25 MED ORDER — INFLUENZA VAC A&B SA ADJ QUAD 0.5 ML IM PRSY
0.5000 mL | PREFILLED_SYRINGE | Freq: Once | INTRAMUSCULAR | Status: AC
  Administered 2020-06-25: 0.5 mL via INTRAMUSCULAR
  Filled 2020-06-25: qty 0.5

## 2020-06-25 MED ORDER — CLONAZEPAM 1 MG PO TABS: 1.0000 mg | ORAL_TABLET | Freq: Two times a day (BID) | ORAL | 0 refills | Status: DC | PRN

## 2020-06-25 MED ORDER — LIDOCAINE 5 % EX PTCH: 1.0000 | MEDICATED_PATCH | CUTANEOUS | 0 refills | Status: DC

## 2020-06-25 NOTE — TOC Progression Note (Signed)
Transition of Care [facility]) - Progression Note    Patient Details  Name: Travis Salinas MRN: [id number] Date of Birth: [birth year]  [facility] CM/SW Contact  Joaquin Courts, RN Phone Number: 06/25/2020, 12:27 PM  Clinical Narrative:  Patient set up with HHPT with [facility].      Expected Discharge Plan: [place] Barriers to Discharge: No Barriers Identified  Expected Discharge Plan and Services Expected Discharge Plan: [place] arrangements for the past 2 months: Single Family Home Expected Discharge Date: 06/25/20                         HH Arranged: PT HH Agency: [facility] Date [place]: 06/25/20 Time [place]: 1227 Representative spoke with at [place]: Amy   Social Determinants of Health ([place]) Interventions    Readmission Risk Interventions No flowsheet data found.

## 2020-06-25 NOTE — Discharge Summary (Signed)
Physician Discharge Summary  Travis Salinas [name]:[id number] DOB: 24-May-1951 DOA: 06/23/2020  PCP: Kathyrn Lass, MD  Admit date: 06/23/2020 Discharge date: 06/25/2020  Time spent: 50 minutes  Recommendations for Outpatient Follow-up:  1. Follow-up PCP in 2 weeks 2. Patient to go home with home health PT.   Discharge Diagnoses:  Principal Problem:   Rib fractures Active Problems:   HTN (hypertension)   Substance abuse (HCC)   COPD (chronic obstructive pulmonary disease) (HCC)   Embolic cerebral infarction [facility])   Discharge Condition: Stable  Diet recommendation: Heart healthy diet  Filed Weights   06/23/20 0023  Weight: 68 kg    History of present illness:  69 year old male with a history of stroke requiring TPA around April this year, COPD, paroxysmal atrial fibrillation, Takotsubo cardiomyopathy with improved EF, hypertension was brought to the ER after fall at home. X-rays showed rib fracture of ninth and 10th rib on right side. Patient was lethargic.He has been taking OxyContin 20 mg every 12 hours at home.   Hospital Course:  1. Right sided rib fracture-s/p fall, patient placed on incentive spirometry.  Continue pain control with oxycodone. Patient is also on lidocaine patch 5% every 24 hours.  Called and discussed with trauma surgeon Dr. Redmond Pulling, he recommends continue above treatment. PT has already seen the patient and recommend home health PT. patient will be discharged with home health PT.  Lidoderm patch.  Continue taking OxyContin at home. 2. Hypersomnolence-patient was very somnolent when he arrived in the ED. currently he is alert, back to baseline.   Opioids were held initially. At this time he is back to baseline.  Continue taking OxyContin at home.  I advised patient wife not to give Klonopin scheduled.  Continue to hold OxyContin and started on oxycodone as above. 3. Acute kidney injury-patient baseline creatinine is around 1.2, he presented with  creatinine 1.5 likely from poor p.o. intake dehydration.  It has improved to 1.25.  Losartan has been discontinued. 4. Hypertension-blood pressure is mild elevated, continue Coreg, losartan is on hold due to kidney injury as above.    Will start amlodipine 10 mg p.o. daily. 5. History of bipolar disorder-continue Abilify,Lamictal. 6. History of paroxysmal atrial fibrillation-continue amiodarone 7. History of stroke-continue statin, aspirin   Procedures:    Consultations:    Discharge Exam: Vitals:   06/25/20 0503 06/25/20 0834  BP: (!) 171/71   Pulse: 62   Resp: 18   Temp: 97.9 F (36.6 C)   SpO2: 97% 98%    General: Appears in no acute distress Cardiovascular: S1-S2, regular Respiratory: Clear to auscultation bilaterally  Discharge Instructions   Discharge Instructions    Diet - low sodium heart healthy   Complete by: As directed    Increase activity slowly   Complete by: As directed      Allergies as of 06/25/2020      Reactions   Propoxyphene Nausea And Vomiting   Amoxicillin Nausea And Vomiting   Ciprofloxacin Nausea Only   Depakote [divalproex Sodium] Other (See Comments)   hallucinations      Medication List    STOP taking these medications   losartan 100 MG tablet Commonly known as: COZAAR     TAKE these medications   albuterol 108 (90 Base) MCG/ACT inhaler Commonly known as: VENTOLIN HFA Inhale 2 puffs into the lungs every 6 (six) hours as needed for wheezing or shortness of breath.   Amantadine HCl 100 MG tablet Take 100 mg by mouth 2 (two)  times daily.   amiodarone 200 MG tablet Commonly known as: PACERONE Take 1 tablet (200 mg total) by mouth daily.   amLODipine 10 MG tablet Commonly known as: NORVASC Take 1 tablet (10 mg total) by mouth daily. Start taking on: June 26, 2020   Anoro Ellipta 62.5-25 MCG/INH Aepb Generic drug: umeclidinium-vilanterol INHALE 1 PUFF BY MOUTH INTO LUNGS DAILY What changed: See the new  instructions.   ARIPiprazole 10 MG tablet Commonly known as: ABILIFY Take 1 tablet (10 mg total) by mouth daily.   aspirin 81 MG EC tablet Take 1 tablet (81 mg total) by mouth daily.   atorvastatin 40 MG tablet Commonly known as: LIPITOR Take 1 tablet (40 mg total) by mouth daily.   carvedilol 3.125 MG tablet Commonly known as: COREG Take 1 tablet (3.125 mg total) by mouth 2 (two) times daily with a meal.   clonazePAM 1 MG tablet Commonly known as: KLONOPIN Take 1 tablet (1 mg total) by mouth 2 (two) times daily as needed for anxiety. What changed:   when to take this  reasons to take this   FLUoxetine 10 MG tablet Commonly known as: PROZAC Take 1 tablet (10 mg total) by mouth daily.   fluticasone 50 MCG/ACT nasal spray Commonly known as: FLONASE Place 2 sprays into both nostrils as needed for allergies or rhinitis.   ipratropium-albuterol 0.5-2.5 (3) MG/3ML Soln Commonly known as: DUONEB Take 3 mLs by nebulization every 4 (four) hours as needed (sob).   lamoTRIgine 200 MG tablet Commonly known as: LAMICTAL Take 1 tablet (200 mg total) by mouth every morning.   lidocaine 5 % Commonly known as: LIDODERM Place 1 patch onto the skin daily. Remove & Discard patch within 12 hours or as directed by MD Start taking on: June 26, 2020   multivitamin with minerals tablet Take 1 tablet by mouth daily.   oxyCODONE 20 mg 12 hr tablet Commonly known as: OXYCONTIN Take 1 tablet (20 mg total) by mouth every 12 (twelve) hours.   polyethylene glycol 17 g packet Commonly known as: MIRALAX / GLYCOLAX Take 17 g by mouth daily as needed for mild constipation.   silodosin 8 MG Caps capsule Commonly known as: RAPAFLO Take 1 capsule (8 mg total) by mouth daily with breakfast.   topiramate 25 MG tablet Commonly known as: TOPAMAX Take 25 mg by mouth daily.      Allergies  Allergen Reactions  . Propoxyphene Nausea And Vomiting  . Amoxicillin Nausea And Vomiting  .  Ciprofloxacin Nausea Only  . Depakote [Divalproex Sodium] Other (See Comments)    hallucinations      The results of significant diagnostics from this hospitalization (including imaging, microbiology, ancillary and laboratory) are listed below for reference.    Significant Diagnostic Studies: CT ABDOMEN PELVIS WO CONTRAST  Result Date: 06/23/2020 CLINICAL DATA:  Fall, right rib pain EXAM: CT CHEST, ABDOMEN AND PELVIS WITHOUT CONTRAST TECHNIQUE: Multidetector CT imaging of the chest, abdomen and pelvis was performed following the standard protocol without IV contrast. COMPARISON:  CT chest dated 03/23/2019 FINDINGS: CT CHEST FINDINGS Cardiovascular: Heart is normal in size.  No pericardial effusion. No evidence of thoracic aortic aneurysm. Atherosclerotic calcifications of the aortic arch. Three vessel coronary atherosclerosis. Mediastinum/Nodes: 9 mm short axis right azygoesophageal recess node, likely reactive. Visualized thyroid is unremarkable. Lungs/Pleura: Evaluation of the lung parenchyma is constrained by respiratory motion. Within that constraint, there are no suspicious pulmonary nodules. Mild patchy bilateral lower lobe opacities, likely atelectasis, right greater than  left. Mild right lower lobe infection/pneumonia is possible (series 6/image 109) but is not favored. Moderate centrilobular and paraseptal emphysematous changes, upper lobe predominant. Superimposed scarring/fibrosis in the bilateral upper lobes, favored to be post infectious/inflammatory. No pleural effusion or pneumothorax. Musculoskeletal: Nondisplaced right posterolateral 8th through 10th rib fractures (series 2/images 56 and 61). Healing left lateral 6th and 7th rib fractures (series 2/images 39 and 46). Sternum, clavicles, scapulae, and thoracic spine are intact. CT ABDOMEN PELVIS FINDINGS Hepatobiliary: Unenhanced liver is unremarkable. No perihepatic fluid/hemorrhage. Gallbladder is unremarkable. No intrahepatic or  extrahepatic ductal dilatation. Pancreas: Within normal limits. Spleen: Within normal limits.  No perisplenic fluid/hemorrhage. Adrenals/Urinary Tract: Adrenal glands are within normal limits. 13 mm lesion along the posterior left lower kidney which cannot be characterized as a simple cyst (series 2/image 77). 6 mm left lower pole renal calculus (series 2/image 80). Punctate nonobstructing right upper pole renal calculus (series 2/image 34). No hydronephrosis. Bladder is within normal limits. Stomach/Bowel: Stomach is within normal limits. No evidence of bowel obstruction. Normal appendix (series 2/image 103). Vascular/Lymphatic: Ectasia of the infrarenal abdominal aorta, measuring 2.8 cm (series 2/image 90). No evidence of abdominal aortic aneurysm. Atherosclerotic calcifications of the abdominal aorta and branch vessels. No suspicious abdominopelvic lymphadenopathy. Reproductive: Prostate is unremarkable. Other: No abdominopelvic ascites. No hemoperitoneum or free air. Musculoskeletal: Visualized osseous structures are within normal limits. No fracture is seen. Lumbar spine and bilateral pelvis are intact. IMPRESSION: Nondisplaced right posterolateral 8th through 10th rib fractures. Old/healing left lateral 6th and 7th rib fractures. No evidence of traumatic injury to the abdomen/pelvis. 13 mm lesion in the posterior left lower kidney which cannot be characterized as a simple cyst. Differential considerations include hemorrhagic cyst (statistically favored) versus small solid renal neoplasm. Given the small size, follow-up MRI abdomen with/without contrast is suggested in 6 months. Please note that inpatient MR evaluation is unlikely to be satisfactory for further characterization due to motion degradation. Electronically Signed   By: Julian Hy M.D.   On: 06/23/2020 09:08   DG Ribs Unilateral W/Chest Right  Result Date: 06/23/2020 CLINICAL DATA:  Fall, right rib pain EXAM: RIGHT RIBS AND CHEST - 3+  VIEW COMPARISON:  02/06/2020 FINDINGS: Lung volumes are small and pulmonary insufflation has diminished since prior examination. Minimal left basilar atelectasis. Interstitial changes noted throughout the lungs bilaterally likely related to interstitial thickening secondary to emphysema and parenchymal scarring noted on prior CT examination of 03/23/2019. There are acute fractures of the right ninth and tenth ribs laterally. No pneumothorax. No pleural effusion. Cardiac size within normal limits. IMPRESSION: Acute fracture of the right ninth and tenth ribs laterally. No pneumothorax. Electronically Signed   By: Fidela Salisbury MD   On: 06/23/2020 01:49   CT Head Wo Contrast  Result Date: 06/23/2020 CLINICAL DATA:  Multiple falls EXAM: CT HEAD WITHOUT CONTRAST TECHNIQUE: Contiguous axial images were obtained from the base of the skull through the vertex without intravenous contrast. COMPARISON:  None. FINDINGS: Brain: There is no mass, hemorrhage or extra-axial collection. The size and configuration of the ventricles and extra-axial CSF spaces are normal. The brain parenchyma is normal, without acute or chronic infarction. Vascular: No abnormal hyperdensity of the major intracranial arteries or dural venous sinuses. No intracranial atherosclerosis. Skull: The visualized skull base, calvarium and extracranial soft tissues are normal. Sinuses/Orbits: Near complete opacification of the left maxillary, ethmoid and frontal sinuses. The orbits are normal. IMPRESSION: 1. No acute intracranial abnormality. 2. Near complete opacification of the left maxillary, ethmoid and  frontal sinuses. Electronically Signed   By: Ulyses Jarred M.D.   On: 06/23/2020 01:55   CT CHEST WO CONTRAST  Result Date: 06/23/2020 CLINICAL DATA:  Fall, right rib pain EXAM: CT CHEST, ABDOMEN AND PELVIS WITHOUT CONTRAST TECHNIQUE: Multidetector CT imaging of the chest, abdomen and pelvis was performed following the standard protocol without IV  contrast. COMPARISON:  CT chest dated 03/23/2019 FINDINGS: CT CHEST FINDINGS Cardiovascular: Heart is normal in size.  No pericardial effusion. No evidence of thoracic aortic aneurysm. Atherosclerotic calcifications of the aortic arch. Three vessel coronary atherosclerosis. Mediastinum/Nodes: 9 mm short axis right azygoesophageal recess node, likely reactive. Visualized thyroid is unremarkable. Lungs/Pleura: Evaluation of the lung parenchyma is constrained by respiratory motion. Within that constraint, there are no suspicious pulmonary nodules. Mild patchy bilateral lower lobe opacities, likely atelectasis, right greater than left. Mild right lower lobe infection/pneumonia is possible (series 6/image 109) but is not favored. Moderate centrilobular and paraseptal emphysematous changes, upper lobe predominant. Superimposed scarring/fibrosis in the bilateral upper lobes, favored to be post infectious/inflammatory. No pleural effusion or pneumothorax. Musculoskeletal: Nondisplaced right posterolateral 8th through 10th rib fractures (series 2/images 56 and 61). Healing left lateral 6th and 7th rib fractures (series 2/images 39 and 46). Sternum, clavicles, scapulae, and thoracic spine are intact. CT ABDOMEN PELVIS FINDINGS Hepatobiliary: Unenhanced liver is unremarkable. No perihepatic fluid/hemorrhage. Gallbladder is unremarkable. No intrahepatic or extrahepatic ductal dilatation. Pancreas: Within normal limits. Spleen: Within normal limits.  No perisplenic fluid/hemorrhage. Adrenals/Urinary Tract: Adrenal glands are within normal limits. 13 mm lesion along the posterior left lower kidney which cannot be characterized as a simple cyst (series 2/image 77). 6 mm left lower pole renal calculus (series 2/image 80). Punctate nonobstructing right upper pole renal calculus (series 2/image 34). No hydronephrosis. Bladder is within normal limits. Stomach/Bowel: Stomach is within normal limits. No evidence of bowel obstruction.  Normal appendix (series 2/image 103). Vascular/Lymphatic: Ectasia of the infrarenal abdominal aorta, measuring 2.8 cm (series 2/image 90). No evidence of abdominal aortic aneurysm. Atherosclerotic calcifications of the abdominal aorta and branch vessels. No suspicious abdominopelvic lymphadenopathy. Reproductive: Prostate is unremarkable. Other: No abdominopelvic ascites. No hemoperitoneum or free air. Musculoskeletal: Visualized osseous structures are within normal limits. No fracture is seen. Lumbar spine and bilateral pelvis are intact. IMPRESSION: Nondisplaced right posterolateral 8th through 10th rib fractures. Old/healing left lateral 6th and 7th rib fractures. No evidence of traumatic injury to the abdomen/pelvis. 13 mm lesion in the posterior left lower kidney which cannot be characterized as a simple cyst. Differential considerations include hemorrhagic cyst (statistically favored) versus small solid renal neoplasm. Given the small size, follow-up MRI abdomen with/without contrast is suggested in 6 months. Please note that inpatient MR evaluation is unlikely to be satisfactory for further characterization due to motion degradation. Electronically Signed   By: Julian Hy M.D.   On: 06/23/2020 09:08    Microbiology: Recent Results (from the past 240 hour(s))  Respiratory Panel by RT PCR (Flu A&B, Covid) - Nasopharyngeal Swab     Status: None   Collection Time: 06/23/20  1:19 AM   Specimen: Nasopharyngeal Swab  Result Value Ref Range Status   SARS Coronavirus 2 by RT PCR NEGATIVE NEGATIVE Final    Comment: (NOTE) SARS-CoV-2 target nucleic acids are NOT DETECTED.  The SARS-CoV-2 RNA is generally detectable in upper respiratoy specimens during the acute phase of infection. The lowest concentration of SARS-CoV-2 viral copies this assay can detect is 131 copies/mL. A negative result does not preclude SARS-Cov-2 infection and should  not be used as the sole basis for treatment or other  patient management decisions. A negative result may occur with  improper specimen collection/handling, submission of specimen other than nasopharyngeal swab, presence of viral mutation(s) within the areas targeted by this assay, and inadequate number of viral copies (<131 copies/mL). A negative result must be combined with clinical observations, patient history, and epidemiological information. The expected result is Negative.  Fact Sheet for Patients:  PinkCheek.be  Fact Sheet for Healthcare Providers:  GravelBags.it  This test is no t yet approved or cleared by the [place] FDA and  has been authorized for detection and/or diagnosis of SARS-CoV-2 by FDA under an Emergency Use Authorization (EUA). This EUA will remain  in effect (meaning this test can be used) for the duration of the COVID-19 declaration under Section 564(b)(1) of the Act, 21 U.S.C. section 360bbb-3(b)(1), unless the authorization is terminated or revoked sooner.     Influenza A by PCR NEGATIVE NEGATIVE Final   Influenza B by PCR NEGATIVE NEGATIVE Final    Comment: (NOTE) The Xpert Xpress SARS-CoV-2/FLU/RSV assay is intended as an aid in  the diagnosis of influenza from Nasopharyngeal swab specimens and  should not be used as a sole basis for treatment. Nasal washings and  aspirates are unacceptable for Xpert Xpress SARS-CoV-2/FLU/RSV  testing.  Fact Sheet for Patients: PinkCheek.be  Fact Sheet for Healthcare Providers: GravelBags.it  This test is not yet approved or cleared by the [place] FDA and  has been authorized for detection and/or diagnosis of SARS-CoV-2 by  FDA under an Emergency Use Authorization (EUA). This EUA will remain  in effect (meaning this test can be used) for the duration of the  Covid-19 declaration under Section 564(b)(1) of the Act, 21  U.S.C. section  360bbb-3(b)(1), unless the authorization is  terminated or revoked. Performed at [facility], [place] [address], [place], [place] [address]      Labs: Basic Metabolic Panel: Recent Labs  Lab 06/23/20 0119 06/23/20 0323 06/23/20 0736 06/24/20 0758 06/25/20 0626  NA 138 141 142 143 140  K 6.0* 4.3 4.9 4.4 3.6  CL 107 108 108 112* 110  CO2 22 25 26 24  20*  GLUCOSE 77 85 82 85 118*  BUN 29* 31* 29* 26* 22  CREATININE 1.50* 1.44* 1.45* 1.25* 1.25*  CALCIUM 9.1 9.0 9.3 8.6* 8.8*   Liver Function Tests: Recent Labs  Lab 06/23/20 0119 06/23/20 0736  AST 109* 102*  ALT 80* 86*  ALKPHOS 105 108  BILITOT 0.9 0.9  PROT 7.1 7.0  ALBUMIN 4.0 3.8   No results for input(s): LIPASE, AMYLASE in the last 168 hours. Recent Labs  Lab 06/23/20 0736  AMMONIA 44*   CBC: Recent Labs  Lab 06/23/20 0119 06/23/20 0736  WBC 9.9 9.4  NEUTROABS 5.7 5.3  HGB 12.9* 13.9  HCT 43.7 46.0  MCV 93.8 92.4  PLT 237 PLATELET CLUMPS NOTED ON SMEAR, UNABLE TO ESTIMATE       Signed:  Oswald Hillock MD.  [facility] 06/25/2020, 12:15 PM

## 2020-06-26 ENCOUNTER — Telehealth (HOSPITAL_COMMUNITY): Payer: Self-pay

## 2020-06-26 NOTE — Telephone Encounter (Signed)
Pt ex wife called and stated that pt fell and he is not going to be able to start pulmonary rehab right now per his doctor he needs to wait at least 3 weeks before he can start due to the fall. Canceled pt pulmonary rehab and pt ex wife stated that they will call back once he is able to schedule for pulmonary rehab. Placed pt ppw in medical hold bin.

## 2020-06-27 NOTE — Telephone Encounter (Signed)
MR - please advise if another CT is needed. Thanks.

## 2020-06-27 NOTE — Telephone Encounter (Signed)
    reviwed chest part of the CT from 06/23/20 below Glad he is better  Plan   - based on this no need for another CT for the moment  - just return in feb /march 2022  And we can decide on repeat Ct timing if needed - return sooner if needed     CHEST CT 06/23/20 Mediastinum/Nodes: 9 mm short axis right azygoesophageal recess node, likely reactive.  Visualized thyroid is unremarkable.  Lungs/Pleura: Evaluation of the lung parenchyma is constrained by respiratory motion. Within that constraint, there are no suspicious pulmonary nodules.  Mild patchy bilateral lower lobe opacities, likely atelectasis, right greater than left. Mild right lower lobe infection/pneumonia is possible (series 6/image 109) but is not favored.  Moderate centrilobular and paraseptal emphysematous changes, upper lobe predominant. Superimposed scarring/fibrosis in the bilateral upper lobes, favored to be post infectious/inflammatory.  No pleural effusion or pneumothorax.  Musculoskeletal: Nondisplaced right posterolateral 8th through 10th rib fractures (series 2/images 56 and 61).  Healing left lateral 6th and 7th rib fractures (series 2/images 39 and 46).  Sternum, clavicles, scapulae, and thoracic spine are intact.

## 2020-06-29 DIAGNOSIS — S2231XA Fracture of one rib, right side, initial encounter for closed fracture: Secondary | ICD-10-CM | POA: Diagnosis not present

## 2020-06-29 DIAGNOSIS — Z09 Encounter for follow-up examination after completed treatment for conditions other than malignant neoplasm: Secondary | ICD-10-CM | POA: Diagnosis not present

## 2020-06-29 DIAGNOSIS — R296 Repeated falls: Secondary | ICD-10-CM | POA: Diagnosis not present

## 2020-06-29 DIAGNOSIS — I69354 Hemiplegia and hemiparesis following cerebral infarction affecting left non-dominant side: Secondary | ICD-10-CM | POA: Diagnosis not present

## 2020-06-29 DIAGNOSIS — J449 Chronic obstructive pulmonary disease, unspecified: Secondary | ICD-10-CM | POA: Diagnosis not present

## 2020-06-30 ENCOUNTER — Ambulatory Visit (HOSPITAL_COMMUNITY): Payer: PPO

## 2020-06-30 ENCOUNTER — Other Ambulatory Visit (HOSPITAL_COMMUNITY): Payer: Self-pay | Admitting: Otolaryngology

## 2020-06-30 DIAGNOSIS — J342 Deviated nasal septum: Secondary | ICD-10-CM | POA: Diagnosis not present

## 2020-06-30 DIAGNOSIS — J343 Hypertrophy of nasal turbinates: Secondary | ICD-10-CM | POA: Diagnosis not present

## 2020-06-30 DIAGNOSIS — J324 Chronic pansinusitis: Secondary | ICD-10-CM | POA: Diagnosis not present

## 2020-07-04 ENCOUNTER — Ambulatory Visit (HOSPITAL_COMMUNITY): Payer: PPO

## 2020-07-06 ENCOUNTER — Ambulatory Visit (HOSPITAL_COMMUNITY): Payer: PPO

## 2020-07-11 ENCOUNTER — Ambulatory Visit (HOSPITAL_COMMUNITY): Payer: PPO

## 2020-07-13 ENCOUNTER — Ambulatory Visit (HOSPITAL_COMMUNITY): Payer: PPO

## 2020-07-13 DIAGNOSIS — R059 Cough, unspecified: Secondary | ICD-10-CM | POA: Diagnosis not present

## 2020-07-18 ENCOUNTER — Ambulatory Visit (HOSPITAL_COMMUNITY): Payer: PPO

## 2020-07-20 ENCOUNTER — Ambulatory Visit (HOSPITAL_COMMUNITY): Payer: PPO

## 2020-07-24 ENCOUNTER — Other Ambulatory Visit (HOSPITAL_COMMUNITY): Payer: Self-pay | Admitting: Sports Medicine

## 2020-07-24 DIAGNOSIS — J343 Hypertrophy of nasal turbinates: Secondary | ICD-10-CM | POA: Diagnosis not present

## 2020-07-24 DIAGNOSIS — J342 Deviated nasal septum: Secondary | ICD-10-CM | POA: Diagnosis not present

## 2020-07-24 DIAGNOSIS — K146 Glossodynia: Secondary | ICD-10-CM | POA: Diagnosis not present

## 2020-07-24 DIAGNOSIS — J324 Chronic pansinusitis: Secondary | ICD-10-CM | POA: Diagnosis not present

## 2020-07-25 ENCOUNTER — Ambulatory Visit (HOSPITAL_COMMUNITY): Payer: PPO

## 2020-07-26 ENCOUNTER — Encounter: Payer: Self-pay | Admitting: Cardiovascular Disease

## 2020-07-26 NOTE — Progress Notes (Signed)
Cardiology Office Note:    Date:  07/28/2020   ID:  Travis Salinas, DOB 09/09/1951, MRN [id number]  PCP:  Kathyrn Lass, MD  Cardiologist:  Ermine Stebbins  Electrophysiologist:  None   Referring MD: Kathyrn Lass, MD   Chief Complaint  Patient presents with  . Congestive Heart Failure    History of Present Illness:    Seen with Travis Salinas ( ex wife, separated but still helps care for him )   Travis Salinas is a 69 y.o. male with a hx of polysubstance ( herion and cocaine)  abuse, COPD, hep C, HTN, CKD,  admitted recently to the hospital with stroke like symptoms   He received tPA.  He developed a wide complex tachycardia ,  Had 2 attempts at cardioversion without conversion and was started on amiodarone   He was found to have atrial fib and acute CHF with a Takotsubo pattern to his LV contractility. EF 35-40%..  Troponin was elevated in a pattern c/w demand ischemia  Was started on Coreg,  BP was too low to start ARB  Breathing is good. Does have some DOE Has COPD .  Has quit smoking  Has stopped cocaine and heroin  No cp   Nov. 18, 2021  Travis Salinas is seen today for follow up of his takotsubo syndrome, polysubstance abuse, stroke He has been started on Losartan and coreg.   His Losartan was changed to amlodipine while he was in the hospital  Echocardiogram from April 19, 2020 reveals normal left ventricular systolic function with an ejection fraction of 60 to 65%.  He has grade 1 diastolic dysfunction.  His ejection fraction has improved from 35 to 40% on his previous echocardiogram from April, 2021. Hs leg edema .  He stopped the amlodipine on Monday because of his leg edema Eating lots of salty foods.     Past Medical History:  Diagnosis Date  . Arthritis   . Cervical disc herniation   . COPD (chronic obstructive pulmonary disease) ([place])   . Headache    " due to antibiotics"  . Hepatitis C   . Hypertension     Past Surgical History:  Procedure Laterality Date  .  APPLICATION OF A-CELL OF EXTREMITY Left 12/05/2016   Procedure: APPLICATION OF A-CELL OF EXTREMITY;  Surgeon: Loel Lofty Dillingham, DO;  Location: [place];  Service: Plastics;  Laterality: Left;  . APPLICATION OF WOUND VAC Left 12/05/2016   Procedure: APPLICATION OF WOUND VAC;  Surgeon: Loel Lofty Dillingham, DO;  Location: [place];  Service: Plastics;  Laterality: Left;  . I & D EXTREMITY Left 11/03/2016   Procedure: IRRIGATION AND DEBRIDEMENT EXTREMITY;  Surgeon: Leandrew Koyanagi, MD;  Location: [place];  Service: Orthopedics;  Laterality: Left;  . I & D EXTREMITY Left 12/05/2016   Procedure: IRRIGATION AND DEBRIDEMENT EXTREMITY;  Surgeon: Loel Lofty Dillingham, DO;  Location: [place];  Service: Plastics;  Laterality: Left;  . INCISION AND DRAINAGE ABSCESS Right 11/12/2013   Procedure: INCISION AND DRAINAGE AND OPEN PACKING OF RIGHT CALF  ABSCESS;  Surgeon: Earnstine Regal, MD;  Location: [facility];  Service: General;  Laterality: Right;  . INCISION AND DRAINAGE OF WOUND Left 01/29/2017   Procedure: IRRIGATION AND DEBRIDEMENT OF LEFT LEG WOUND WITH ABRA PLACEMENT AND PLACEMENT OF WOUND VAC;  Surgeon: Wallace Going, DO;  Location: [facility];  Service: Plastics;  Laterality: Left;  . KNEE SURGERY      Current Medications: Current Meds  Medication  Sig  . albuterol (VENTOLIN HFA) 108 (90 Base) MCG/ACT inhaler Inhale 2 puffs into the lungs every 6 (six) hours as needed for wheezing or shortness of breath.  . alprazolam (XANAX) 2 MG tablet Take 1 mg by mouth in the morning, at noon, in the evening, and at bedtime.  . Amantadine HCl 100 MG tablet Take 100 mg by mouth 2 (two) times daily.  Jearl Klinefelter ELLIPTA 62.5-25 MCG/INH AEPB INHALE 1 PUFF BY MOUTH INTO LUNGS DAILY  . ARIPiprazole (ABILIFY) 10 MG tablet Take 1 tablet (10 mg total) by mouth daily.  Marland Kitchen aspirin EC 81 MG EC tablet Take 1 tablet (81 mg total) by mouth daily.  Marland Kitchen atorvastatin (LIPITOR) 40 MG tablet Take 1 tablet (40 mg total) by mouth daily.  . carvedilol (COREG)  3.125 MG tablet Take 1 tablet (3.125 mg total) by mouth 2 (two) times daily with a meal.  . clonazePAM (KLONOPIN) 0.5 MG tablet Take 0.5 mg by mouth 2 (two) times daily as needed for anxiety.  Marland Kitchen FLUoxetine (PROZAC) 10 MG tablet Take 1 tablet (10 mg total) by mouth daily.  . fluticasone (FLONASE) 50 MCG/ACT nasal spray Place 2 sprays into both nostrils as needed for allergies or rhinitis.   Marland Kitchen ipratropium-albuterol (DUONEB) 0.5-2.5 (3) MG/3ML SOLN Take 3 mLs by nebulization every 4 (four) hours as needed (sob).  Marland Kitchen lamoTRIgine (LAMICTAL) 200 MG tablet Take 1 tablet (200 mg total) by mouth every morning.  . lidocaine (LIDODERM) 5 % Place 1 patch onto the skin daily. Remove & Discard patch within 12 hours or as directed by MD  . Multiple Vitamins-Minerals (MULTIVITAMIN WITH MINERALS) tablet Take 1 tablet by mouth daily.  Marland Kitchen oxyCODONE (OXYCONTIN) 20 mg 12 hr tablet Take 1 tablet (20 mg total) by mouth every 12 (twelve) hours.  . polyethylene glycol (MIRALAX / GLYCOLAX) 17 g packet Take 17 g by mouth daily as needed for mild constipation.   . silodosin (RAPAFLO) 8 MG CAPS capsule Take 1 capsule (8 mg total) by mouth daily with breakfast.  . topiramate (TOPAMAX) 25 MG tablet Take 25 mg by mouth daily.  . [DISCONTINUED] amiodarone (PACERONE) 200 MG tablet Take 1 tablet (200 mg total) by mouth daily.  . [DISCONTINUED] amLODipine (NORVASC) 10 MG tablet Take 1 tablet (10 mg total) by mouth daily.     Allergies:   Propoxyphene, Amoxicillin, Ciprofloxacin, and Depakote [divalproex sodium]   Social History   Socioeconomic History  . Marital status: Married    Spouse name: Not on file  . Number of children: Not on file  . Years of education: Not on file  . Highest education level: Not on file  Occupational History  . Not on file  Tobacco Use  . Smoking status: Former Smoker    Packs/day: 1.00    Years: 32.00    Pack years: 32.00    Types: Cigarettes    Quit date: 2002    Years since quitting: 19.8    . Smokeless tobacco: Never Used  Vaping Use  . Vaping Use: Never used  Substance and Sexual Activity  . Alcohol use: No    Alcohol/week: 0.0 standard drinks  . Drug use: Not Currently    Types: Heroin, Cocaine    Comment: " relapsed once this year " 2018  . Sexual activity: Not on file  Other Topics Concern  . Not on file  Social History Narrative   Lives with roommate In [place]    Married, lives separate from wife  Right handed   Caffeine: occasionally coffee and coke    Social Determinants of Health   Financial Resource Strain:   . Difficulty of Paying Living Expenses: Not on file  Food Insecurity:   . Worried About Charity fundraiser in the Last Year: Not on file  . Ran Out of Food in the Last Year: Not on file  Transportation Needs:   . Lack of Transportation (Medical): Not on file  . Lack of Transportation (Non-Medical): Not on file  Physical Activity:   . Days of Exercise per Week: Not on file  . Minutes of Exercise per Session: Not on file  Stress:   . Feeling of Stress : Not on file  Social Connections:   . Frequency of Communication with Friends and Family: Not on file  . Frequency of Social Gatherings with Friends and Family: Not on file  . Attends Religious Services: Not on file  . Active Member of Clubs or Organizations: Not on file  . Attends Archivist Meetings: Not on file  . Marital Status: Not on file     Family History: The patient's family history includes Heart disease in his father and mother; Stroke in his father and mother.  ROS:   Please see the history of present illness.     All other systems reviewed and are negative.  EKGs/Labs/Other Studies Reviewed:    The following studies were reviewed today:   EKG:    Recent Labs: 01/02/2020: Magnesium 1.9; TSH 1.235 06/23/2020: ALT 86; Hemoglobin 13.9; Platelets PLATELET CLUMPS NOTED ON SMEAR, UNABLE TO ESTIMATE 06/25/2020: BUN 22; Creatinine, Ser 1.25; Potassium 3.6; Sodium 140   Recent Lipid Panel    Component Value Date/Time   CHOL 151 01/02/2020 0329   TRIG 97 01/02/2020 0329   HDL 45 01/02/2020 0329   CHOLHDL 3.4 01/02/2020 0329   VLDL 19 01/02/2020 0329   LDLCALC 87 01/02/2020 0329    Physical Exam:    Physical Exam: Blood pressure 100/62, pulse 66, height 5\' 11"  (1.803 m), weight 148 lb 12.8 oz (67.5 kg), SpO2 94 %.  GEN:  Well nourished, well developed in no acute distress HEENT: Normal NECK: No JVD; No carotid bruits LYMPHATICS: No lymphadenopathy CARDIAC: RRR , no murmurs, rubs, gallops RESPIRATORY:  Clear to auscultation without rales, wheezing or rhonchi  ABDOMEN: Soft, non-tender, non-distended MUSCULOSKELETAL:  No edema; No deformity  SKIN: Warm and dry NEUROLOGIC:  Alert and oriented x 3   ASSESSMENT:    1. Acute on chronic combined systolic and diastolic heart failure ([place])   2. Primary hypertension    PLAN:    In order of problems listed above:  1.   History of recent stroke: Eural presented with a stroke symptoms.  He also had been using cocaine and had Takotsubo cardiomyopathy.  He received TPA and neurologically has recovered.    2.  Takotsubo syndrome: He had an acute systolic congestive heart failure.  Ejection fraction was around 35%.  He was started on carvedilol 3.125 mg twice a day.   His EF has improved   He was sent home from the hospital on amlodipine- losartan was stopped due to creatinine of 1.5 ( but   Developed leg edema on amlopidine  He still eats a lot of salt.  At this time we will discontinue the amlodipine.  My hope is that his blood pressure will stay normal if he can get rid of the salt in his diet.  We will have him  see an APP in 3 months.  If his blood pressure does increase, we will start him on losartan again.   3.  Paroxysmal atrial fibrillation: He presented with rapid atrial fibrillation with aberrant conduction.  This was in the setting of cocaine abuse.  He is not using cocaine reportedly      Medication Adjustments/Labs and Tests Ordered: Current medicines are reviewed at length with the patient today.  Concerns regarding medicines are outlined above.  No orders of the defined types were placed in this encounter.  Meds ordered this encounter  Medications  . amiodarone (PACERONE) 100 MG tablet    Sig: Take 1 tablet (100 mg total) by mouth daily.    Dispense:  90 tablet    Refill:  0    Patient Instructions  Medication Instructions:  Your physician has recommended you make the following change in your medication:  STOP Amlodipine (Norvasc) DECREASE Amiodarone to 100 mg once daily  *If you need a refill on your cardiac medications before your next appointment, please call your pharmacy*   Lab Work: None Ordered If you have labs (blood work) drawn today and your tests are completely normal, you will receive your results only by: Marland Kitchen MyChart Message (if you have MyChart) OR . A paper copy in the mail If you have any lab test that is abnormal or we need to change your treatment, we will call you to review the results.    Testing/Procedures: None Ordered    Follow-Up: At [facility], you and your health needs are our priority.  As part of our continuing mission to provide you with exceptional heart care, we have created designated Provider Care Teams.  These Care Teams include your primary Cardiologist (physician) and Advanced Practice Providers (APPs -  Physician Assistants and Nurse Practitioners) who all work together to provide you with the care you need, when you need it.   Your next appointment:   3 month(s) on February 23 at 12:15 pm  The format for your next appointment:   In Person  Provider:   Richardson Dopp, PA   Other Instructions  Low-Sodium Eating Plan Sodium, which is an element that makes up salt, helps you maintain a healthy balance of fluids in your body. Too much sodium can increase your blood pressure and cause fluid and waste to be  held in your body. Your health care provider or dietitian may recommend following this plan if you have high blood pressure (hypertension), kidney disease, liver disease, or heart failure. Eating less sodium can help lower your blood pressure, reduce swelling, and protect your heart, liver, and kidneys. What are tips for following this plan? General guidelines  Most people on this plan should limit their sodium intake to 1,500-2,000 mg (milligrams) of sodium each day. Reading food labels   The Nutrition Facts label lists the amount of sodium in one serving of the food. If you eat more than one serving, you must multiply the listed amount of sodium by the number of servings.  Choose foods with less than 140 mg of sodium per serving.  Avoid foods with 300 mg of sodium or more per serving. Shopping  Look for lower-sodium products, often labeled as "low-sodium" or "no salt added."  Always check the sodium content even if foods are labeled as "unsalted" or "no salt added".  Buy fresh foods. ? Avoid canned foods and premade or frozen meals. ? Avoid canned, cured, or processed meats  Buy breads that have less than  80 mg of sodium per slice. Cooking  Eat more home-cooked food and less restaurant, buffet, and fast food.  Avoid adding salt when cooking. Use salt-free seasonings or herbs instead of table salt or sea salt. Check with your health care provider or pharmacist before using salt substitutes.  Cook with plant-based oils, such as canola, sunflower, or olive oil. Meal planning  When eating at a restaurant, ask that your food be prepared with less salt or no salt, if possible.  Avoid foods that contain MSG (monosodium glutamate). MSG is sometimes added to [place] food, bouillon, and some canned foods. What foods are recommended? The items listed may not be a complete list. Talk with your dietitian about what dietary choices are best for you. Grains Low-sodium cereals, including  oats, puffed wheat and rice, and shredded wheat. Low-sodium crackers. Unsalted rice. Unsalted pasta. Low-sodium bread. Whole-grain breads and whole-grain pasta. Vegetables Fresh or frozen vegetables. "No salt added" canned vegetables. "No salt added" tomato sauce and paste. Low-sodium or reduced-sodium tomato and vegetable juice. Fruits Fresh, frozen, or canned fruit. Fruit juice. Meats and other protein foods Fresh or frozen (no salt added) meat, poultry, seafood, and fish. Low-sodium canned tuna and salmon. Unsalted nuts. Dried peas, beans, and lentils without added salt. Unsalted canned beans. Eggs. Unsalted nut butters. Dairy Milk. Soy milk. Cheese that is naturally low in sodium, such as ricotta cheese, fresh mozzarella, or Swiss cheese Low-sodium or reduced-sodium cheese. Cream cheese. Yogurt. Fats and oils Unsalted butter. Unsalted margarine with no trans fat. Vegetable oils such as canola or olive oils. Seasonings and other foods Fresh and dried herbs and spices. Salt-free seasonings. Low-sodium mustard and ketchup. Sodium-free salad dressing. Sodium-free light mayonnaise. Fresh or refrigerated horseradish. Lemon juice. Vinegar. Homemade, reduced-sodium, or low-sodium soups. Unsalted popcorn and pretzels. Low-salt or salt-free chips. What foods are not recommended? The items listed may not be a complete list. Talk with your dietitian about what dietary choices are best for you. Grains Instant hot cereals. Bread stuffing, pancake, and biscuit mixes. Croutons. Seasoned rice or pasta mixes. Noodle soup cups. Boxed or frozen macaroni and cheese. Regular salted crackers. Self-rising flour. Vegetables Sauerkraut, pickled vegetables, and relishes. Olives. [place] fries. Onion rings. Regular canned vegetables (not low-sodium or reduced-sodium). Regular canned tomato sauce and paste (not low-sodium or reduced-sodium). Regular tomato and vegetable juice (not low-sodium or reduced-sodium). Frozen  vegetables in sauces. Meats and other protein foods Meat or fish that is salted, canned, smoked, spiced, or pickled. Bacon, ham, sausage, hotdogs, corned beef, chipped beef, packaged lunch meats, salt pork, jerky, pickled herring, anchovies, regular canned tuna, sardines, salted nuts. Dairy Processed cheese and cheese spreads. Cheese curds. Blue cheese. Feta cheese. String cheese. Regular cottage cheese. Buttermilk. Canned milk. Fats and oils Salted butter. Regular margarine. Ghee. Bacon fat. Seasonings and other foods Onion salt, garlic salt, seasoned salt, table salt, and sea salt. Canned and packaged gravies. Worcestershire sauce. Tartar sauce. Barbecue sauce. Teriyaki sauce. Soy sauce, including reduced-sodium. Steak sauce. Fish sauce. Oyster sauce. Cocktail sauce. Horseradish that you find on the shelf. Regular ketchup and mustard. Meat flavorings and tenderizers. Bouillon cubes. Hot sauce and Tabasco sauce. Premade or packaged marinades. Premade or packaged taco seasonings. Relishes. Regular salad dressings. Salsa. Potato and tortilla chips. Corn chips and puffs. Salted popcorn and pretzels. Canned or dried soups. Pizza. Frozen entrees and pot pies. Summary  Eating less sodium can help lower your blood pressure, reduce swelling, and protect your heart, liver, and kidneys.  Most people on  this plan should limit their sodium intake to 1,500-2,000 mg (milligrams) of sodium each day.  Canned, boxed, and frozen foods are high in sodium. Restaurant foods, fast foods, and pizza are also very high in sodium. You also get sodium by adding salt to food.  Try to cook at home, eat more fresh fruits and vegetables, and eat less fast food, canned, processed, or prepared foods. This information is not intended to replace advice given to you by your health care provider. Make sure you discuss any questions you have with your health care provider. Document Revised: 08/08/2017 Document Reviewed:  08/19/2016 Elsevier Patient Education  2020 [place], Mertie Moores, MD  07/28/2020 5:59 PM    [place]

## 2020-07-27 ENCOUNTER — Ambulatory Visit (HOSPITAL_COMMUNITY): Payer: PPO

## 2020-07-28 ENCOUNTER — Other Ambulatory Visit: Payer: Self-pay

## 2020-07-28 ENCOUNTER — Other Ambulatory Visit (HOSPITAL_BASED_OUTPATIENT_CLINIC_OR_DEPARTMENT_OTHER): Payer: Self-pay | Admitting: Internal Medicine

## 2020-07-28 ENCOUNTER — Ambulatory Visit: Payer: PPO | Attending: Internal Medicine

## 2020-07-28 ENCOUNTER — Ambulatory Visit: Payer: PPO | Admitting: Cardiovascular Disease

## 2020-07-28 ENCOUNTER — Encounter: Payer: Self-pay | Admitting: Cardiovascular Disease

## 2020-07-28 VITALS — BP 100/62 | HR 66 | Ht 71.0 in | Wt 148.8 lb

## 2020-07-28 DIAGNOSIS — Z23 Encounter for immunization: Secondary | ICD-10-CM

## 2020-07-28 DIAGNOSIS — I10 Essential (primary) hypertension: Secondary | ICD-10-CM | POA: Diagnosis not present

## 2020-07-28 DIAGNOSIS — I5043 Acute on chronic combined systolic (congestive) and diastolic (congestive) heart failure: Secondary | ICD-10-CM | POA: Diagnosis not present

## 2020-07-28 MED ORDER — AMIODARONE HCL 100 MG PO TABS: 100.0000 mg | ORAL_TABLET | Freq: Every day | ORAL | 0 refills | Status: DC

## 2020-07-28 NOTE — Patient Instructions (Addendum)
Medication Instructions:  Your physician has recommended you make the following change in your medication:  STOP Amlodipine (Norvasc) DECREASE Amiodarone to 100 mg once daily  *If you need a refill on your cardiac medications before your next appointment, please call your pharmacy*   Lab Work: None Ordered If you have labs (blood work) drawn today and your tests are completely normal, you will receive your results only by:  [place] (if you have MyChart) OR  A paper copy in the mail If you have any lab test that is abnormal or we need to change your treatment, we will call you to review the results.    Testing/Procedures: None Ordered    Follow-Up: At [facility], you and your health needs are our priority.  As part of our continuing mission to provide you with exceptional heart care, we have created designated Provider Care Teams.  These Care Teams include your primary Cardiologist (physician) and Advanced Practice Providers (APPs -  Physician Assistants and Nurse Practitioners) who all work together to provide you with the care you need, when you need it.   Your next appointment:   3 month(s) on February 23 at 12:15 pm  The format for your next appointment:   In Person  Provider:   Richardson Dopp, PA   Other Instructions  Low-Sodium Eating Plan Sodium, which is an element that makes up salt, helps you maintain a healthy balance of fluids in your body. Too much sodium can increase your blood pressure and cause fluid and waste to be held in your body. Your health care provider or dietitian may recommend following this plan if you have high blood pressure (hypertension), kidney disease, liver disease, or heart failure. Eating less sodium can help lower your blood pressure, reduce swelling, and protect your heart, liver, and kidneys. What are tips for following this plan? General guidelines  Most people on this plan should limit their sodium intake to 1,500-2,000  mg (milligrams) of sodium each day. Reading food labels   The Nutrition Facts label lists the amount of sodium in one serving of the food. If you eat more than one serving, you must multiply the listed amount of sodium by the number of servings.  Choose foods with less than 140 mg of sodium per serving.  Avoid foods with 300 mg of sodium or more per serving. Shopping  Look for lower-sodium products, often labeled as "low-sodium" or "no salt added."  Always check the sodium content even if foods are labeled as "unsalted" or "no salt added".  Buy fresh foods. ? Avoid canned foods and premade or frozen meals. ? Avoid canned, cured, or processed meats  Buy breads that have less than 80 mg of sodium per slice. Cooking  Eat more home-cooked food and less restaurant, buffet, and fast food.  Avoid adding salt when cooking. Use salt-free seasonings or herbs instead of table salt or sea salt. Check with your health care provider or pharmacist before using salt substitutes.  Cook with plant-based oils, such as canola, sunflower, or olive oil. Meal planning  When eating at a restaurant, ask that your food be prepared with less salt or no salt, if possible.  Avoid foods that contain MSG (monosodium glutamate). MSG is sometimes added to [place] food, bouillon, and some canned foods. What foods are recommended? The items listed may not be a complete list. Talk with your dietitian about what dietary choices are best for you. Grains Low-sodium cereals, including oats, puffed wheat and rice,  and shredded wheat. Low-sodium crackers. Unsalted rice. Unsalted pasta. Low-sodium bread. Whole-grain breads and whole-grain pasta. Vegetables Fresh or frozen vegetables. "No salt added" canned vegetables. "No salt added" tomato sauce and paste. Low-sodium or reduced-sodium tomato and vegetable juice. Fruits Fresh, frozen, or canned fruit. Fruit juice. Meats and other protein foods Fresh or frozen (no salt  added) meat, poultry, seafood, and fish. Low-sodium canned tuna and salmon. Unsalted nuts. Dried peas, beans, and lentils without added salt. Unsalted canned beans. Eggs. Unsalted nut butters. Dairy Milk. Soy milk. Cheese that is naturally low in sodium, such as ricotta cheese, fresh mozzarella, or Swiss cheese Low-sodium or reduced-sodium cheese. Cream cheese. Yogurt. Fats and oils Unsalted butter. Unsalted margarine with no trans fat. Vegetable oils such as canola or olive oils. Seasonings and other foods Fresh and dried herbs and spices. Salt-free seasonings. Low-sodium mustard and ketchup. Sodium-free salad dressing. Sodium-free light mayonnaise. Fresh or refrigerated horseradish. Lemon juice. Vinegar. Homemade, reduced-sodium, or low-sodium soups. Unsalted popcorn and pretzels. Low-salt or salt-free chips. What foods are not recommended? The items listed may not be a complete list. Talk with your dietitian about what dietary choices are best for you. Grains Instant hot cereals. Bread stuffing, pancake, and biscuit mixes. Croutons. Seasoned rice or pasta mixes. Noodle soup cups. Boxed or frozen macaroni and cheese. Regular salted crackers. Self-rising flour. Vegetables Sauerkraut, pickled vegetables, and relishes. Olives. [place] fries. Onion rings. Regular canned vegetables (not low-sodium or reduced-sodium). Regular canned tomato sauce and paste (not low-sodium or reduced-sodium). Regular tomato and vegetable juice (not low-sodium or reduced-sodium). Frozen vegetables in sauces. Meats and other protein foods Meat or fish that is salted, canned, smoked, spiced, or pickled. Bacon, ham, sausage, hotdogs, corned beef, chipped beef, packaged lunch meats, salt pork, jerky, pickled herring, anchovies, regular canned tuna, sardines, salted nuts. Dairy Processed cheese and cheese spreads. Cheese curds. Blue cheese. Feta cheese. String cheese. Regular cottage cheese. Buttermilk. Canned milk. Fats and  oils Salted butter. Regular margarine. Ghee. Bacon fat. Seasonings and other foods Onion salt, garlic salt, seasoned salt, table salt, and sea salt. Canned and packaged gravies. Worcestershire sauce. Tartar sauce. Barbecue sauce. Teriyaki sauce. Soy sauce, including reduced-sodium. Steak sauce. Fish sauce. Oyster sauce. Cocktail sauce. Horseradish that you find on the shelf. Regular ketchup and mustard. Meat flavorings and tenderizers. Bouillon cubes. Hot sauce and Tabasco sauce. Premade or packaged marinades. Premade or packaged taco seasonings. Relishes. Regular salad dressings. Salsa. Potato and tortilla chips. Corn chips and puffs. Salted popcorn and pretzels. Canned or dried soups. Pizza. Frozen entrees and pot pies. Summary  Eating less sodium can help lower your blood pressure, reduce swelling, and protect your heart, liver, and kidneys.  Most people on this plan should limit their sodium intake to 1,500-2,000 mg (milligrams) of sodium each day.  Canned, boxed, and frozen foods are high in sodium. Restaurant foods, fast foods, and pizza are also very high in sodium. You also get sodium by adding salt to food.  Try to cook at home, eat more fresh fruits and vegetables, and eat less fast food, canned, processed, or prepared foods. This information is not intended to replace advice given to you by your health care provider. Make sure you discuss any questions you have with your health care provider. Document Revised: 08/08/2017 Document Reviewed: 08/19/2016 Elsevier Patient Education  2020 [employer].

## 2020-07-28 NOTE — Progress Notes (Signed)
   Covid-19 Vaccination Clinic  Name:  Tamara Kenyon    MRN: [id number] DOB: 06-06-1951  07/28/2020  Mr. St [name] was observed post Covid-19 immunization for 15 minutes without incident. He was provided with Vaccine Information Sheet and instruction to access the V-Safe system.   Mr. Aydn Ferrara was instructed to call 911 with any severe reactions post vaccine: Marland Kitchen Difficulty breathing  . Swelling of face and throat  . A fast heartbeat  . A bad rash all over body  . Dizziness and weakness   Immunizations Administered    Name Date Dose VIS Date Route   Pfizer COVID-19 Vaccine 07/28/2020 12:36 PM 0.3 mL 06/28/2020 Intramuscular   Manufacturer: [place]   Lot: [id number]   [place]: [id number]

## 2020-08-01 ENCOUNTER — Ambulatory Visit (HOSPITAL_COMMUNITY): Payer: PPO

## 2020-08-08 ENCOUNTER — Ambulatory Visit (HOSPITAL_COMMUNITY): Payer: PPO

## 2020-08-10 ENCOUNTER — Ambulatory Visit (HOSPITAL_COMMUNITY): Payer: PPO

## 2020-08-15 ENCOUNTER — Ambulatory Visit (HOSPITAL_COMMUNITY): Payer: PPO

## 2020-08-17 ENCOUNTER — Ambulatory Visit (HOSPITAL_COMMUNITY): Payer: PPO

## 2020-08-21 ENCOUNTER — Other Ambulatory Visit (HOSPITAL_COMMUNITY): Payer: Self-pay | Admitting: Sports Medicine

## 2020-08-22 ENCOUNTER — Ambulatory Visit (HOSPITAL_COMMUNITY): Payer: PPO

## 2020-08-22 ENCOUNTER — Other Ambulatory Visit (HOSPITAL_COMMUNITY): Payer: Self-pay | Admitting: Otolaryngology

## 2020-08-22 DIAGNOSIS — J0101 Acute recurrent maxillary sinusitis: Secondary | ICD-10-CM | POA: Diagnosis not present

## 2020-08-22 DIAGNOSIS — J342 Deviated nasal septum: Secondary | ICD-10-CM | POA: Diagnosis not present

## 2020-08-22 DIAGNOSIS — J343 Hypertrophy of nasal turbinates: Secondary | ICD-10-CM | POA: Diagnosis not present

## 2020-08-24 ENCOUNTER — Ambulatory Visit (HOSPITAL_COMMUNITY): Payer: PPO

## 2020-08-25 DIAGNOSIS — J449 Chronic obstructive pulmonary disease, unspecified: Secondary | ICD-10-CM | POA: Diagnosis not present

## 2020-08-29 ENCOUNTER — Ambulatory Visit (HOSPITAL_COMMUNITY): Payer: PPO

## 2020-08-31 ENCOUNTER — Ambulatory Visit (HOSPITAL_COMMUNITY): Payer: PPO

## 2020-09-05 ENCOUNTER — Other Ambulatory Visit: Payer: Self-pay | Admitting: Internal Medicine

## 2020-09-05 DIAGNOSIS — R3912 Poor urinary stream: Secondary | ICD-10-CM | POA: Diagnosis not present

## 2020-09-05 DIAGNOSIS — N401 Enlarged prostate with lower urinary tract symptoms: Secondary | ICD-10-CM | POA: Diagnosis not present

## 2020-09-11 ENCOUNTER — Other Ambulatory Visit (HOSPITAL_COMMUNITY): Payer: Self-pay | Admitting: Family Medicine

## 2020-09-11 DIAGNOSIS — J441 Chronic obstructive pulmonary disease with (acute) exacerbation: Secondary | ICD-10-CM | POA: Diagnosis not present

## 2020-09-11 DIAGNOSIS — Z8739 Personal history of other diseases of the musculoskeletal system and connective tissue: Secondary | ICD-10-CM | POA: Diagnosis not present

## 2020-09-12 ENCOUNTER — Telehealth (HOSPITAL_COMMUNITY): Payer: Self-pay

## 2020-09-12 NOTE — Telephone Encounter (Signed)
Pt ex wife Gaylyn Lambert called and stated that pt has been cleared by Peri Maris NP from [place] to participate in pulmonary rehab, she stated that she has his clearance paperwork I advised her that our nurse navigator has to contact that office and to view the paperwork advised her that once our nurse navigator has done that and has the okay we would call her back to schedule him. She understood.

## 2020-09-13 DIAGNOSIS — J310 Chronic rhinitis: Secondary | ICD-10-CM | POA: Diagnosis not present

## 2020-09-13 DIAGNOSIS — J343 Hypertrophy of nasal turbinates: Secondary | ICD-10-CM | POA: Diagnosis not present

## 2020-09-18 ENCOUNTER — Other Ambulatory Visit (HOSPITAL_COMMUNITY): Payer: Self-pay | Admitting: Sports Medicine

## 2020-09-20 DIAGNOSIS — M1711 Unilateral primary osteoarthritis, right knee: Secondary | ICD-10-CM | POA: Diagnosis not present

## 2020-09-20 DIAGNOSIS — M47812 Spondylosis without myelopathy or radiculopathy, cervical region: Secondary | ICD-10-CM | POA: Diagnosis not present

## 2020-09-21 ENCOUNTER — Other Ambulatory Visit (HOSPITAL_COMMUNITY): Payer: Self-pay | Admitting: Sports Medicine

## 2020-09-22 NOTE — Telephone Encounter (Signed)
Pt insurance is active and benefits verified through [place] $30, DED 0/0 met, out of pocket $4,200/0 met, co-insurance 0%. no pre-authorization required. Passport, John/BCBS 09/22/2020_0 :45pm, REF# [id number]

## 2020-09-26 ENCOUNTER — Ambulatory Visit (HOSPITAL_COMMUNITY): Payer: Medicare Other

## 2020-10-12 ENCOUNTER — Encounter (HOSPITAL_COMMUNITY): Payer: Self-pay

## 2020-10-21 ENCOUNTER — Other Ambulatory Visit (HOSPITAL_COMMUNITY): Payer: Self-pay | Admitting: Physician Assistant

## 2020-10-25 ENCOUNTER — Other Ambulatory Visit (HOSPITAL_COMMUNITY): Payer: Self-pay | Admitting: Psychiatry

## 2020-10-25 DIAGNOSIS — F3341 Major depressive disorder, recurrent, in partial remission: Secondary | ICD-10-CM | POA: Diagnosis not present

## 2020-10-25 DIAGNOSIS — F341 Dysthymic disorder: Secondary | ICD-10-CM | POA: Diagnosis not present

## 2020-10-25 DIAGNOSIS — F411 Generalized anxiety disorder: Secondary | ICD-10-CM | POA: Diagnosis not present

## 2020-10-25 DIAGNOSIS — G2111 Neuroleptic induced parkinsonism: Secondary | ICD-10-CM | POA: Diagnosis not present

## 2020-10-31 NOTE — Progress Notes (Addendum)
Cardiology Office Note:    Date:  11/01/2020   ID:  Travis Salinas, DOB September 30, 1950, MRN [id number]  PCP:  Kathyrn Lass, [facility]  Cardiologist:  Mertie Moores, MD   Advanced Practice Provider:  No care team member to display Electrophysiologist:  None       Referring MD: Kathyrn Lass, MD   Chief Complaint:  Follow-up (HTN)    Patient Profile:    Travis Salinas is a 70 y.o. male with:   Polysubstance abuse hx (cocaine, heroin)  COPD  Hep C  Hypertension   Hyperlipidemia   Chronic kidney disease   Hx of CVA 4/21 in setting of cocaine use; tx w tPA ? WCT/Paroxysmal Atrial fibrillation   Amiodarone Rx  Tako-Tsubo CM; EF 35-40 ? Echocardiogram 8/21: EF 60-65, Gr 1 DD  Chronic kidney disease    Prior CV studies: Echocardiogram 04/19/20 EF 60-65, mild LVH, Gr 1 DD, normal RVSF, trivial MR  Carotid US 01/03/20 No sig ICA stenosis  Echocardiogram 01/02/20 EF 35-40, mod LVH, Gr 1 DD, normal RVSF, trivial MR, apical AK   Myoview 10/04/16 Apical thinning, normal perfusion, EF 58; low risk  History of Present Illness:    [name] was admitted in 12/2019 with an acute CVA in the setting of cocaine abuse.  He was tx with tPA.  He developed WCT / atrial fibrillation that converted with Amiodarone.  Echocardiogram demonstrated EF 35-40 and WMA c/w Tako-tsubo CM.  He was not started on anticoagulation and it was felt Amiodarone could be DC'd after 2-3 mos.  A follow up echocardiogram in 04/2020 showed improved LVF with EF 60-65.  He was last seen by Dr. Acie Fredrickson in 11/21.  He had been taken off of Losartan due to worsening creatinine during an admission to the hospital for rib fractures suffered in a fall.  He was started on Amlodipine but this was stopped due to leg edema.  The plan was to resume Losartan if his BP remained uncontrolled.  He returns for f/u.  Since last seen, he has done well without chest discomfort.  He has not had  syncope.  He does have chronic shortness of breath with exertion.  This is related to his COPD.  He has not noticed any significant change.  He has not had orthopnea, leg edema.      Past Medical History:  Diagnosis Date  . Arthritis   . Cervical disc herniation   . COPD (chronic obstructive pulmonary disease) ([place])   . Headache    " due to antibiotics"  . Hepatitis C   . Hypertension     Current Medications: Current Meds  Medication Sig  . albuterol (VENTOLIN HFA) 108 (90 Base) MCG/ACT inhaler Inhale 2 puffs into the lungs every 6 (six) hours as needed for wheezing or shortness of breath.  . alprazolam (XANAX) 2 MG tablet Take 1 mg by mouth 3 (three) times daily as needed for sleep or anxiety.  . Amantadine HCl 100 MG tablet Take 100 mg by mouth 2 (two) times daily.  Marland Kitchen amiodarone (PACERONE) 100 MG tablet Take 1 tablet (100 mg total) by mouth daily.  Jearl Klinefelter ELLIPTA 62.5-25 MCG/INH AEPB INHALE 1 PUFF BY MOUTH INTO THE LUNGS DAILY  . ARIPiprazole (ABILIFY) 10 MG tablet Take 1 tablet (10 mg total) by mouth daily.  Marland Kitchen aspirin EC 81 MG EC tablet Take 1 tablet (81 mg total) by mouth daily.  Marland Kitchen atorvastatin (  LIPITOR) 40 MG tablet Take 1 tablet (40 mg total) by mouth daily.  . clonazePAM (KLONOPIN) 0.5 MG tablet Take 0.5 mg by mouth 2 (two) times daily as needed for anxiety.  Marland Kitchen FLUoxetine (PROZAC) 10 MG tablet Take 1 tablet (10 mg total) by mouth daily.  . fluticasone (FLONASE) 50 MCG/ACT nasal spray Place 2 sprays into both nostrils as needed for allergies or rhinitis.   Marland Kitchen ipratropium-albuterol (DUONEB) 0.5-2.5 (3) MG/3ML SOLN Take 3 mLs by nebulization every 4 (four) hours as needed (sob).  Marland Kitchen lamoTRIgine (LAMICTAL) 200 MG tablet Take 1 tablet (200 mg total) by mouth every morning.  Marland Kitchen losartan (COZAAR) 25 MG tablet Take 1 tablet (25 mg total) by mouth daily.  . metoprolol succinate (TOPROL-XL) 25 MG 24 hr tablet Take 1 tablet (25 mg total) by mouth daily.  . Multiple Vitamins-Minerals  (MULTIVITAMIN WITH MINERALS) tablet Take 1 tablet by mouth daily.  Marland Kitchen oxyCODONE (OXYCONTIN) 20 mg 12 hr tablet Take 1 tablet (20 mg total) by mouth every 12 (twelve) hours.  . polyethylene glycol (MIRALAX / GLYCOLAX) 17 g packet Take 17 g by mouth daily as needed for mild constipation.   . silodosin (RAPAFLO) 8 MG CAPS capsule Take 1 capsule (8 mg total) by mouth daily with breakfast.  . topiramate (TOPAMAX) 25 MG tablet Take 25 mg by mouth daily.  . [DISCONTINUED] carvedilol (COREG) 3.125 MG tablet Take 1 tablet (3.125 mg total) by mouth 2 (two) times daily with a meal.     Allergies:   Propoxyphene, Amoxicillin, Ciprofloxacin, and Depakote [divalproex sodium]   Social History   Tobacco Use  . Smoking status: Former Smoker    Packs/day: 1.00    Years: 32.00    Pack years: 32.00    Types: Cigarettes    Quit date: 2002    Years since quitting: 20.1  . Smokeless tobacco: Never Used  Vaping Use  . Vaping Use: Never used  Substance Use Topics  . Alcohol use: No    Alcohol/week: 0.0 standard drinks  . Drug use: Not Currently    Types: Heroin, Cocaine    Comment: " relapsed once this year " 2018     Family Hx: The patient's family history includes Heart disease in his father and mother; Stroke in his father and mother.  ROS   EKGs/Labs/Other Test Reviewed:    EKG:  EKG is not ordered today.  The ekg ordered today demonstrates n/a  Recent Labs: 01/02/2020: Magnesium 1.9; TSH 1.235 06/23/2020: ALT 86; Hemoglobin 13.9; Platelets PLATELET CLUMPS NOTED ON SMEAR, UNABLE TO ESTIMATE 06/25/2020: BUN 22; Creatinine, Ser 1.25; Potassium 3.6; Sodium 140   Recent Lipid Panel Lab Results  Component Value Date/Time   CHOL 151 01/02/2020 03:29 AM   TRIG 97 01/02/2020 03:29 AM   HDL 45 01/02/2020 03:29 AM   CHOLHDL 3.4 01/02/2020 03:29 AM   LDLCALC 87 01/02/2020 03:29 AM      Risk Assessment/Calculations:    CHA2DS2-VASc Score = 5  This indicates a 7.2% annual risk of stroke. The  patient's score is based upon: CHF History: Yes HTN History: Yes Diabetes History: No Stroke History: Yes Vascular Disease History: No Age Score: 1 Gender Score: 0     Physical Exam:    VS:  BP 140/80   Pulse 60   Ht 5\' 11"  (1.803 m)   Wt 141 lb 1.6 oz (64 kg)   SpO2 98%   BMI 19.68 kg/m     Wt Readings from Last 3  Encounters:  11/01/20 141 lb 1.6 oz (64 kg)  07/28/20 148 lb 12.8 oz (67.5 kg)  06/23/20 150 lb (68 kg)     Constitutional:      Appearance: Healthy appearance. Not in distress.  Neck:     Vascular: JVD normal.  Pulmonary:     Effort: Pulmonary effort is normal.     Breath sounds: No wheezing. No rales.  Cardiovascular:     Normal rate. Regular rhythm. Normal S1. Normal S2.     Murmurs: There is no murmur.  Edema:    Peripheral edema absent.  Abdominal:     Palpations: Abdomen is soft. There is no hepatomegaly.  Skin:    General: Skin is warm and dry.  Neurological:     General: No focal deficit present.     Mental Status: Alert and oriented to person, place and time.     Cranial Nerves: Cranial nerves are intact.       ASSESSMENT & PLAN:    1. Essential hypertension He brings in a list of his blood pressures from home.  He had several readings that are optimal.  He has other readings that are elevated.  He notes that his blood pressures were more stable when he was on losartan.  He often forgets to take his evening dose of carvedilol.  Once daily medication would be better for him.  Therefore, I will stop his carvedilol.  Start metoprolol succinate 25 mg daily.  Start back on losartan 25 mg daily.  Obtain follow-up CMET in 1 week.  We discussed the appropriate way to monitor blood pressure.  I have asked him to send me 2 weeks of blood pressure readings through MyChart.  Follow-up with me in 4 to 6 weeks.  2. Paroxysmal atrial fibrillation (HCC) Maintaining sinus rhythm.  Continue current dose of amiodarone.  3. Takotsubo cardiomyopathy Previous  EF was 35-40.  This improved to normal.  Continue beta-blocker.  Resume ARB as noted.    Dispo:  Return in about 6 weeks (around 12/13/2020) for Routine Follow Up, w/ Richardson Dopp, PA-C, in person.   Medication Adjustments/Labs and Tests Ordered: Current medicines are reviewed at length with the patient today.  Concerns regarding medicines are outlined above.  Tests Ordered: Orders Placed This Encounter  Procedures  . Comprehensive metabolic panel   Medication Changes: Meds ordered this encounter  Medications  . metoprolol succinate (TOPROL-XL) 25 MG 24 hr tablet    Sig: Take 1 tablet (25 mg total) by mouth daily.    Dispense:  90 tablet    Refill:  3  . losartan (COZAAR) 25 MG tablet    Sig: Take 1 tablet (25 mg total) by mouth daily.    Dispense:  90 tablet    Refill:  3    Signed, Richardson Dopp, PA-C  11/01/2020 5:55 PM    [facility] [address], [place], [place]  [address] Phone: [phone]; Fax: [phone]

## 2020-11-01 ENCOUNTER — Ambulatory Visit (INDEPENDENT_AMBULATORY_CARE_PROVIDER_SITE_OTHER): Payer: Medicare Other | Admitting: Physician Assistant

## 2020-11-01 ENCOUNTER — Encounter: Payer: Self-pay | Admitting: Physician Assistant

## 2020-11-01 ENCOUNTER — Other Ambulatory Visit: Payer: Self-pay

## 2020-11-01 ENCOUNTER — Other Ambulatory Visit: Payer: Self-pay | Admitting: Physician Assistant

## 2020-11-01 VITALS — BP 140/80 | HR 60 | Ht 71.0 in | Wt 141.1 lb

## 2020-11-01 DIAGNOSIS — I480 Paroxysmal atrial fibrillation: Secondary | ICD-10-CM | POA: Diagnosis not present

## 2020-11-01 DIAGNOSIS — I10 Essential (primary) hypertension: Secondary | ICD-10-CM

## 2020-11-01 DIAGNOSIS — I5181 Takotsubo syndrome: Secondary | ICD-10-CM

## 2020-11-01 MED ORDER — METOPROLOL SUCCINATE ER 25 MG PO TB24: 25.0000 mg | ORAL_TABLET | Freq: Every day | ORAL | 3 refills | Status: DC

## 2020-11-01 MED ORDER — LOSARTAN POTASSIUM 25 MG PO TABS: 25.0000 mg | ORAL_TABLET | Freq: Every day | ORAL | 3 refills | Status: DC

## 2020-11-01 NOTE — Patient Instructions (Addendum)
Medication Instructions:  Your physician has recommended you make the following change in your medication:   STOP Carvedilol START Toprol XL 25mg  daily  START Losartan 25mg  daily  *If you need a refill on your cardiac medications before your next appointment, please call your pharmacy*   Lab Work: CMET in 1 week.  If you have labs (blood work) drawn today and your tests are completely normal, you will receive your results only by: Marland Kitchen MyChart Message (if you have MyChart) OR . A paper copy in the mail If you have any lab test that is abnormal or we need to change your treatment, we will call you to review the results.   Testing/Procedures: none   Follow-Up: At [facility], you and your health needs are our priority.  As part of our continuing mission to provide you with exceptional heart care, we have created designated Provider Care Teams.  These Care Teams include your primary Cardiologist (physician) and Advanced Practice Providers (APPs -  Physician Assistants and Nurse Practitioners) who all work together to provide you with the care you need, when you need it.   Your next appointment:   4-6 week(s)  The format for your next appointment:   In Person  Provider:   You will see one of the following Advanced Practice Providers on your designated Care Team:    Richardson Dopp, [place]

## 2020-11-02 ENCOUNTER — Other Ambulatory Visit: Payer: Self-pay | Admitting: Cardiovascular Disease

## 2020-11-06 ENCOUNTER — Ambulatory Visit: Payer: Medicare Other | Admitting: Internal Medicine

## 2020-11-06 ENCOUNTER — Encounter: Payer: Self-pay | Admitting: Internal Medicine

## 2020-11-06 ENCOUNTER — Other Ambulatory Visit: Payer: Self-pay

## 2020-11-06 VITALS — BP 114/68 | HR 57 | Temp 97.1°F | Ht 71.0 in | Wt 144.2 lb

## 2020-11-06 DIAGNOSIS — J439 Emphysema, unspecified: Secondary | ICD-10-CM | POA: Diagnosis not present

## 2020-11-06 DIAGNOSIS — R5381 Other malaise: Secondary | ICD-10-CM | POA: Diagnosis not present

## 2020-11-06 DIAGNOSIS — J449 Chronic obstructive pulmonary disease, unspecified: Secondary | ICD-10-CM | POA: Diagnosis not present

## 2020-11-06 NOTE — Progress Notes (Signed)
PCP Kathyrn Lass, MD   HPI   IOV  08/22/2017   Chief Complaint  Patient presents with  . Advice Only    Referred by Dr. Lennette Bihari Via for COPD.  Pt does have complaints of SOB. Pt had pna and was hospitalized 10/5-10/26    Travis Salinas. Travis Salinas is a 70 year old male who is been referred by primary care physician for COPD.  History is obtained from him, talking to his wife and review of the outside chart from primary care physician and past medical chart in the electronic health record.  He carries a diagnosis of COPD for many years.  He is always maintained himself on albuterol as needed.  He quit smoking 16 years ago.  Then all of a sudden in September 2018 he was hospitalized for rhinovirus and associated pneumococcal bacteremia with pneumonia.  He spent over a month in the hospital including several weeks in the intensive care unit being treated with BiPAP but not intubated.  He spent time between the critical care service and also the hospitalist service.  He ended up getting discharged in October 2018 to a long-term acute care facility and from there to an inpatient rehab facility.  He has now been home for 6 weeks.  At this point in time his COPD CAT score is 12.  He feels improved and back to baseline but according to his wife he still far away from baseline.  Overall fatigue and shortness of breath with exertion or major issues.  This is documented in the COPD CAD score.  His current COPD medications only albuterol as needed.  He does not have pulmonary function test in the past.  Review of the chart also shows he had acute diastolic heart failure while in the hospital and he was diuresed.  A few days ago primary care physician is stopped his Lasix and his told the family that I could restart it at my discretion.  His last set of lab work was in November 2018 with primary care physician but in October 2018 in our health record system he had normal creatinine but anemia of chronic  disease/critical illness  His last chest x-ray that I personally visualized was June 17, 2017 that still showed pulmonary infiltrates.  According to the family he had another chest x-ray in November 2018 that still shows pulmonary infiltrates.  He has not had follow-up imaging since then.   OV 10/01/2017  Chief Complaint  Patient presents with  . Follow-up    PFT done today.  CT scan done 09/19/17.  Pt states he has been doing much better since last visit. States he has mild SOB with exertion. Pt's wife states he used his O2 last night and some this morning which pt stated it was to prepare for today's visit.   Follow-up emphysema and recent hospitalization for pneumococcal pneumonia  Present his wife.  In the interim he has been on Spiriva.  Smoking is in remission.  He continues to improve.  COPD CAT score is dropped to 6.  He feels great.  He is here to start pulmonary rehabilitation.  He had follow-up CT scan of the chest in January 2019 and when compared to his pneumonia in September 2018 his or pulmonary infiltrates of all clear except he has bilateral upper lobe scarring.  He does have evidence of emphysema on a CT chest.  His pulmonary function test is normal except for reduction in DLCO at 40%.  He continues to  use oxygen at night.  He is compliant with his Spiriva.  His blood work is all normal.  His wife wants FMLA paperwork filled because she has to take him to rehab.  They are wondering about the rehab referral.    OV 02/17/2018  Chief Complaint  Patient presents with  . Follow-up    39-month follow up visit. Pt states he has been doing well and denies any complaints.   Follow-up pulmonary emphysema with isolated reduction in diffusion capacity and history of smoking. CT Jan 2019 with UL fibrosis. Sept 2018 hospitalization for RV and pneumococcal pneumonia  Last seen January 2019.  After that he had COPD exacerbation and seen acutely and treated as an outpatient.  This was in  March 2019 when he saw my colleague Dr. Halford Chessman.  Chest x-ray at that time was clear.  Currently he is feeling well with Spiriva.  He denies any smoking.  Wife also states he does not smoke anymore.  COPD CAT score is 9 which is close to his baseline.  He stopped attending pulmonary rehabilitation because of travel and his exacerbation and he wants to rejoin.  Given his emphysema and age of 70 he might be a candidate for lung cancer screening program with a nurse intake smoking history seems inadequate for that.  We will have to reassess his smoking history in detail.      OV 10/01/2018  Subjective:  Patient ID: Travis Salinas, male , DOB: Mar 24, 1951 , age 70 y.o. , MRN: [id number] , ADDRESS: [place] [address]   10/01/2018 -   Chief Complaint  Patient presents with  . Follow-up    Pt states he has had no energy the last 3 months and states his breathing has been worse. Pt also states it feels like someone is sitting on his chest.    Follow-up pulmonary emphysema (alpha 1 MS) with isolated reduction in diffusion capacity and history of smoking. CT Jan 2019 with UL fibrosis. Sept 2018 hospitalization for RV and pneumococcal pneumonia    HPI Travis Salinas 70 y.o. -last visit June 2019.  He presents with his wife.  He was attending pulmonary rehabilitation till November 2019.  After that he is not attending.  Now he is reporting more fatigue.  He states he lives on the third floor with a roommate [?  He does not live with his wife].  However he does state that he can walk up all 3 floors without stopping.  His COPD CAT score seems significantly worse than even in the past when he had pneumonia.  I am not sure he is answered this correctly.  Nevertheless fatigue and shortness of breath with exertion seem to be a dominant thing.  His wife feels he needs to get back to exercise.  He insists he does not smoke.  Wife also does not he does not smoke.  Review of his immunization  records sho that he could benefit from pneumonia vaccine.  He does not recollect having had one.   OV 05/27/2019  Subjective:  Patient ID: Travis Salinas, male , DOB: [birth year] , age 70 y.o. , MRN: [id number] , ADDRESS: [place] [id number]   05/27/2019 -   Chief Complaint  Patient presents with  . Pulmonary emphysema, unspecified emphysema type    Feels breathing is the same as at last visit in February. Medications are still working.     Follow-up pulmonary emphysema (alpha 1  MS) with isolated reduction in diffusion capacity and history of smoking. CT Jan 2019 with UL fibrosis. Sept 2018 hospitalization for RV and pneumococcal pneumonia   HPI Travis Salinas 70 y.o. -presents for follow-up of the above issues.  He says he is doing well.  He still has dyspnea on exertion when he climbs 3 flights of stairs to his apartment.  But he says he does not desaturate below 94%.  He is not doing pulmonary rehabilitation.  He is doing Spiriva and DuoNeb.  He says he has had a flu shot for this 2020-2021 season.  His COPD CAT score is higher but I am not so sure he answered it reliably.  He had a CT scan of the chest in July which is stable.        IMPRESSION: 1. Moderate centrilobular and paraseptal emphysema with mild diffuse bronchial wall thickening, compatible with the reported history of COPD. 2. Upper lobe predominant patchy reticulation and parenchymal banding, unchanged, compatible with nonspecific smoking related fibrosis versus postinfectious/postinflammatory scarring. Findings are not suggestive of interstitial lung disease. No acute pulmonary disease. 3. Three-vessel coronary atherosclerosis.  Aortic Atherosclerosis (ICD10-I70.0) and Emphysema (ICD10-J43.9).   Electronically Signed   By: Ilona Sorrel M.D.   On: 03/24/2019 10:02  ROS - per HPI   OV 05/09/2020  Subjective:  Patient ID: Travis Salinas, male , DOB: 1951-07-12 , age 66  y.o. , MRN: [id number] , ADDRESS: [address] [place] [address]   05/09/2020 -   Chief Complaint  Patient presents with  . Follow-up    COPD     HPI Travis Salinas 70 y.o. -presents for follow-up of COPD.  Personally last seen in September 2020 after the nurse practitioner saw him earlier in 2021.  He is here with his wife.  He tells me that he lives on the third floor and is able to climb up without stopping.  He is able to walk around his house.  Because of his prior stroke issues he is slow with this according to his wife.  He also describes some balance issues early morning when he gets up fast.  He starts working sideways.  His wife tells me this is baseline and he has been counseled as to how to deal with this by physical therapy.  He used to attend pulmonary rehabilitation is interested in the idea of revisiting with this.  His last CT chest was in July 2020.  At that time no evidence of lung cancer.  He is happy with his Anoro.       CAT COPD Symptom & Quality of Life Score (GSK trademark) 0 is no burden. 5 is highest burden 08/22/2017  1 10/01/2017 spiriva and time 02/17/2018  10/01/2018   Never Cough -> Cough all the time 0 0 0 5  No phlegm in chest -> Chest is full of phlegm 1 0 1 0  No chest tightness -> Chest feels very tight 4 0 2 5  No dyspnea for 1 flight stairs/hill -> Very dyspneic for 1 flight of stairs 2 2 0 5  No limitations for ADL at home -> Very limited with ADL at home 0 1 0 5  Confident leaving home -> Not at all confident leaving home 0 0 0 0  Sleep soundly -> Do not sleep soundly because of lung condition 4 0 3 0  Lots of Energy -> No energy at all  3 3 5  TOTAL Score (max 40)  12 6 9 25    ROS - per HPI CAT Score 05/09/2020 10/21/2019 02/17/2018  Total CAT Score 6 17 9     PFT Results Latest Ref Rng & Units 10/01/2017  FVC-Pre L 4.30  FVC-Predicted Pre % 93  FVC-Post L 4.32  FVC-Predicted Post % 94  Pre FEV1/FVC % % 77  Post  FEV1/FCV % % 80  FEV1-Pre L 3.32  FEV1-Predicted Pre % 97  FEV1-Post L 3.47  DLCO uncorrected ml/min/mmHg 13.05  DLCO UNC% % 40  DLCO corrected ml/min/mmHg 13.57  DLCO COR %Predicted % 41  DLVA Predicted % 50  TLC L 5.69  TLC % Predicted % 81  RV % Predicted % 60      OV 11/06/2020  Subjective:  Patient ID: Travis Salinas, male , DOB: 06/29/1951 , age 47 y.o. , MRN: [id number] , ADDRESS: [address] [place] [address] PCP Kathyrn Lass, MD Patient Care Team: Kathyrn Lass, MD as PCP - General (Family Medicine) Nahser, Wonda Cheng, MD as PCP - Cardiology (Cardiology)  This Provider for this visit: Treatment Team:  Attending Provider: Brand Males, MD    11/06/2020 -   Chief Complaint  Patient presents with  . Follow-up    Pt states he is about the same since last visit. Does have complaints of SOB which is worse with going up stairs but can happen with exertion at any time. Denies any complaints of coughing, chest tightness, or wheezing.     HPI [place] 70 y.o. -returns for follow-up with his wife.  He continues to live on the third floor of an apartment building with his roommate.  Wife lives 15 minutes away but cares for him.  Overall he is stable.  In October 2020 when he had a fall and had rib fractures the results are below.  At this point in time he is stable on Anoro.  He is up-to-date with his vaccines.  They plan to start pulmonary rehabilitation.  To have an visit for 6-minute walk test and prepulmonary rehab evaluation.  Wife and he think he will pass it although he continues have gait problems and some memory issues following the stroke.    CAT Score 11/06/2020 05/09/2020 10/21/2019 02/17/2018  Total CAT Score 11 6 17 9    Ct chest oct 2021   COMPARISON:  CT chest dated 03/23/2019  FINDINGS: CT CHEST FINDINGS  Cardiovascular: Heart is normal in size.  No pericardial effusion.  No evidence of thoracic aortic aneurysm.  Atherosclerotic calcifications of the aortic arch.  Three vessel coronary atherosclerosis.  Mediastinum/Nodes: 9 mm short axis right azygoesophageal recess node, likely reactive.  Visualized thyroid is unremarkable.  Lungs/Pleura: Evaluation of the lung parenchyma is constrained by respiratory motion. Within that constraint, there are no suspicious pulmonary nodules.  Mild patchy bilateral lower lobe opacities, likely atelectasis, right greater than left. Mild right lower lobe infection/pneumonia is possible (series 6/image 109) but is not favored.  Moderate centrilobular and paraseptal emphysematous changes, upper lobe predominant. Superimposed scarring/fibrosis in the bilateral upper lobes, favored to be post infectious/inflammatory.  No pleural effusion or pneumothorax.  Musculoskeletal: Nondisplaced right posterolateral 8th through 10th rib fractures (series 2/images 56 and 61).  Healing left lateral 6th and 7th rib fractures (series 2/images 39 and 46).  Sternum, clavicles, scapulae, and thoracic spine are intact.    PFT  PFT Results Latest Ref Rng & Units 10/01/2017  FVC-Pre L 4.30  FVC-Predicted Pre %  93  FVC-Post L 4.32  FVC-Predicted Post % 94  Pre FEV1/FVC % % 77  Post FEV1/FCV % % 80  FEV1-Pre L 3.32  FEV1-Predicted Pre % 97  FEV1-Post L 3.47  DLCO uncorrected ml/min/mmHg 13.05  DLCO UNC% % 40  DLCO corrected ml/min/mmHg 13.57  DLCO COR %Predicted % 41  DLVA Predicted % 50  TLC L 5.69  TLC % Predicted % 81  RV % Predicted % 60       has a past medical history of Arthritis, Cervical disc herniation, COPD (chronic obstructive pulmonary disease) ([place]), Headache, Hepatitis C, and Hypertension.   reports that he quit smoking about 20 years ago. His smoking use included cigarettes. He started smoking about 54 years ago. He has a 32.00 pack-year smoking history. He has never used smokeless tobacco.  Past Surgical History:  Procedure  Laterality Date  . APPLICATION OF A-CELL OF EXTREMITY Left 12/05/2016   Procedure: APPLICATION OF A-CELL OF EXTREMITY;  Surgeon: Loel Lofty Dillingham, DO;  Location: [facility];  Service: Plastics;  Laterality: Left;  . APPLICATION OF WOUND VAC Left 12/05/2016   Procedure: APPLICATION OF WOUND VAC;  Surgeon: Loel Lofty Dillingham, DO;  Location: [facility];  Service: Plastics;  Laterality: Left;  . I & D EXTREMITY Left 11/03/2016   Procedure: IRRIGATION AND DEBRIDEMENT EXTREMITY;  Surgeon: Leandrew Koyanagi, MD;  Location: [facility];  Service: Orthopedics;  Laterality: Left;  . I & D EXTREMITY Left 12/05/2016   Procedure: IRRIGATION AND DEBRIDEMENT EXTREMITY;  Surgeon: Loel Lofty Dillingham, DO;  Location: [facility];  Service: Plastics;  Laterality: Left;  . INCISION AND DRAINAGE ABSCESS Right 11/12/2013   Procedure: INCISION AND DRAINAGE AND OPEN PACKING OF RIGHT CALF  ABSCESS;  Surgeon: Earnstine Regal, MD;  Location: [facility];  Service: General;  Laterality: Right;  . INCISION AND DRAINAGE OF WOUND Left 01/29/2017   Procedure: IRRIGATION AND DEBRIDEMENT OF LEFT LEG WOUND WITH ABRA PLACEMENT AND PLACEMENT OF WOUND VAC;  Surgeon: Wallace Going, DO;  Location: [facility];  Service: Plastics;  Laterality: Left;  . KNEE SURGERY      Allergies  Allergen Reactions  . Propoxyphene Nausea And Vomiting  . Amoxicillin Nausea And Vomiting  . Ciprofloxacin Nausea Only  . Depakote [Divalproex Sodium] Other (See Comments)    hallucinations    Immunization History  Administered Date(s) Administered  . Fluad Quad(high Dose 65+) 06/25/2020  . Hep A / Hep B 03/16/2015, 05/03/2015, 10/02/2015  . Influenza Split 06/23/2017, 06/25/2020  . Influenza, High Dose Seasonal PF 06/23/2017, 05/13/2018, 05/20/2019  . Influenza, Seasonal, Injecte, Preservative Fre 07/10/2016  . PFIZER(Purple Top)SARS-COV-2 Vaccination 10/01/2019, 10/22/2019, 07/28/2020  . Pneumococcal Conjugate-13 10/31/2017, 10/01/2018  . Pneumococcal Polysaccharide-23  11/17/2018  . Tdap 11/05/2013  . Zoster 05/07/2018, 07/14/2018  . Zoster Recombinat (Shingrix) 05/06/2018, 07/09/2018    Family History  Problem Relation Age of Onset  . Heart disease Mother   . Stroke Mother   . Heart disease Father   . Stroke Father      Current Outpatient Medications:  .  albuterol (VENTOLIN HFA) 108 (90 Base) MCG/ACT inhaler, Inhale 2 puffs into the lungs every 6 (six) hours as needed for wheezing or shortness of breath., Disp: 6.7 g, Rfl: 0 .  alprazolam (XANAX) 2 MG tablet, Take 1 mg by mouth 3 (three) times daily as needed for sleep or anxiety., Disp: , Rfl:  .  Amantadine HCl 100 MG tablet, Take 100 mg by mouth 2 (two) times daily.,  Disp: , Rfl:  .  amiodarone (PACERONE) 100 MG tablet, TAKE 1 TABLET BY MOUTH DAILY, Disp: 90 tablet, Rfl: 3 .  ANORO ELLIPTA 62.5-25 MCG/INH AEPB, INHALE 1 PUFF BY MOUTH INTO THE LUNGS DAILY, Disp: 60 each, Rfl: 3 .  ARIPiprazole (ABILIFY) 10 MG tablet, Take 1 tablet (10 mg total) by mouth daily., Disp:  , Rfl:  .  aspirin EC 81 MG EC tablet, Take 1 tablet (81 mg total) by mouth daily., Disp:  , Rfl:  .  atorvastatin (LIPITOR) 40 MG tablet, Take 1 tablet (40 mg total) by mouth daily., Disp: 90 tablet, Rfl: 3 .  clonazePAM (KLONOPIN) 0.5 MG tablet, Take 0.5 mg by mouth 2 (two) times daily as needed for anxiety., Disp: , Rfl:  .  FLUoxetine (PROZAC) 10 MG tablet, Take 1 tablet (10 mg total) by mouth daily., Disp: 30 tablet, Rfl: 3 .  fluticasone (FLONASE) 50 MCG/ACT nasal spray, Place 2 sprays into both nostrils as needed for allergies or rhinitis. , Disp: , Rfl:  .  ipratropium-albuterol (DUONEB) 0.5-2.5 (3) MG/3ML SOLN, Take 3 mLs by nebulization every 4 (four) hours as needed (sob)., Disp: , Rfl:  .  lamoTRIgine (LAMICTAL) 200 MG tablet, Take 1 tablet (200 mg total) by mouth every morning., Disp: 30 tablet, Rfl: 0 .  losartan (COZAAR) 25 MG tablet, Take 1 tablet (25 mg total) by mouth daily., Disp: 90 tablet, Rfl: 3 .  metoprolol  succinate (TOPROL-XL) 25 MG 24 hr tablet, Take 1 tablet (25 mg total) by mouth daily., Disp: 90 tablet, Rfl: 3 .  Multiple Vitamins-Minerals (MULTIVITAMIN WITH MINERALS) tablet, Take 1 tablet by mouth daily., Disp: , Rfl:  .  oxyCODONE (OXYCONTIN) 20 mg 12 hr tablet, Take 1 tablet (20 mg total) by mouth every 12 (twelve) hours., Disp: 14 tablet, Rfl: 0 .  polyethylene glycol (MIRALAX / GLYCOLAX) 17 g packet, Take 17 g by mouth daily as needed for mild constipation. , Disp: , Rfl:  .  silodosin (RAPAFLO) 8 MG CAPS capsule, Take 1 capsule (8 mg total) by mouth daily with breakfast., Disp: 30 capsule, Rfl: 0 .  topiramate (TOPAMAX) 25 MG tablet, Take 25 mg by mouth daily., Disp: , Rfl:       Objective:   Vitals:   11/06/20 0931  BP: 114/68  Pulse: (!) 57  Temp: (!) 97.1 F (36.2 C)  TempSrc: Temporal  SpO2: 96%  Weight: 144 lb 3.2 oz (65.4 kg)  Height: 5\' 11"  (1.803 m)    Estimated body mass index is 20.11 kg/m as calculated from the following:   Height as of this encounter: 5\' 11"  (1.803 m).   Weight as of this encounter: 144 lb 3.2 oz (65.4 kg).  @WEIGHTCHANGE @  [employer]   11/06/20 0931  Weight: 144 lb 3.2 oz (65.4 kg)     Physical ExamGeneral: No distress. Lean decodntin Neuro: Alert and Oriented x 3. GCS 15. Speech normal Psych: Pleasant Resp:  Barrel Chest - no.  Wheeze - no, Crackles - no, No overt respiratory distress CVS: Normal heart sounds. Murmurs - no Ext: Stigmata of Connective Tissue Disease - n HEENT: Normal upper airway. PEERL +. No post nasal drip        Assessment:       ICD-10-CM   1. Pulmonary emphysema, unspecified emphysema type ([place])  J43.9   2. Physical deconditioning  R53.81   3. Chronic obstructive pulmonary disease, unspecified COPD type ([place])  J44.9  Plan:     Patient Instructions     ICD-10-CM   1. Pulmonary emphysema, unspecified emphysema type ([place])  J43.9   2. Physical deconditioning  R53.81   3. Chronic  obstructive pulmonary disease, unspecified COPD type ([place])  J44.9     Glad you are  overall stable Gait issues are related to prior stroke  Plan -Glad youa re going to start pulm rehab eval soon -Continue Anoro schedule with albuterol as needed -Continue Flonase any other medications  Follow-up -6 months for routine follow-up 15-minute slot  -cat score at followup     SIGNATURE    Dr. Brand Males, M.D., F.C.C.P,  Pulmonary and Critical Care Medicine Staff Physician, [place] Director - Interstitial Lung Disease  Program  Pulmonary [facility] [place], [place], [address]  Pager: [phone], If no answer or between  15:00h - 7:00h: call [phone] Telephone: [phone]  9:54 AM 11/06/2020

## 2020-11-06 NOTE — Patient Instructions (Addendum)
ICD-10-CM   1. Pulmonary emphysema, unspecified emphysema type ([place])  J43.9   2. Physical deconditioning  R53.81   3. Chronic obstructive pulmonary disease, unspecified COPD type ([place])  J44.9     Glad you are  overall stable Gait issues are related to prior stroke  Plan -Glad youa re going to start pulm rehab eval soon -Continue Anoro schedule with albuterol as needed -Continue Flonase any other medications  Follow-up -6 months for routine follow-up 15-minute slot  -cat score at followup

## 2020-11-10 ENCOUNTER — Other Ambulatory Visit: Payer: Self-pay

## 2020-11-10 ENCOUNTER — Other Ambulatory Visit: Payer: Medicare Other | Admitting: *Deleted

## 2020-11-10 DIAGNOSIS — I10 Essential (primary) hypertension: Secondary | ICD-10-CM

## 2020-11-14 ENCOUNTER — Other Ambulatory Visit: Payer: Self-pay | Admitting: Physician Assistant

## 2020-11-14 DIAGNOSIS — I10 Essential (primary) hypertension: Secondary | ICD-10-CM | POA: Diagnosis not present

## 2020-11-14 LAB — COMPREHENSIVE METABOLIC PANEL
ALT: 38 IU/L (ref 0–44)
AST: 47 IU/L — ABNORMAL HIGH (ref 0–40)
Albumin/Globulin Ratio: 2.2 (ref 1.2–2.2)
Albumin: 4.3 g/dL (ref 3.8–4.8)
Alkaline Phosphatase: 99 IU/L (ref 44–121)
BUN/Creatinine Ratio: 23 (ref 10–24)
BUN: 32 mg/dL — ABNORMAL HIGH (ref 8–27)
Bilirubin Total: 0.4 mg/dL (ref 0.0–1.2)
CO2: 26 mmol/L (ref 20–29)
Calcium: 9.6 mg/dL (ref 8.6–10.2)
Chloride: 112 mmol/L — ABNORMAL HIGH (ref 96–106)
Creatinine, Ser: 1.38 mg/dL — ABNORMAL HIGH (ref 0.76–1.27)
Globulin, Total: 2 g/dL (ref 1.5–4.5)
Glucose: 94 mg/dL (ref 65–99)
Potassium: 4.6 mmol/L (ref 3.5–5.2)
Sodium: 146 mmol/L — ABNORMAL HIGH (ref 134–144)
Total Protein: 6.3 g/dL (ref 6.0–8.5)
eGFR: 56 mL/min/{1.73_m2} — ABNORMAL LOW (ref >59)

## 2020-11-15 ENCOUNTER — Encounter (HOSPITAL_COMMUNITY): Payer: Self-pay | Admitting: *Deleted

## 2020-11-15 NOTE — Progress Notes (Signed)
Called pt to give him reminder of his orientation appointment for Pulmonary Rehab.We discussed mask, proper shoes,directions to the department and our contact number if he was unable to attend. He ask me to also call his wife with the information. I did contact his wife and left her a message with this information and ask her to call us if she has any questions.

## 2020-11-16 ENCOUNTER — Telehealth: Payer: Self-pay | Admitting: *Deleted

## 2020-11-16 DIAGNOSIS — Z79899 Other long term (current) drug therapy: Secondary | ICD-10-CM

## 2020-11-16 NOTE — Telephone Encounter (Signed)
-----   Message from Liliane Shi, [place] sent at 11/14/2020  5:13 PM EST ----- Creatinine stable.  K+ normal.  Na, Cl elevated.  ALT normal.   Elevated BUN, Na, Cl are suspicious for dehydration. PLAN:  - Make sure pt is drinking plenty of fluids.  Push water over the next few days. - Ask him if he has been feeling poorly (vomiting, diarrhea, not eating??) - Repeat BMET 1 week. Richardson Dopp, PA-C    11/14/2020 5:06 PM

## 2020-11-17 ENCOUNTER — Ambulatory Visit (HOSPITAL_COMMUNITY): Payer: Medicare Other

## 2020-11-17 ENCOUNTER — Other Ambulatory Visit: Payer: Self-pay

## 2020-11-17 ENCOUNTER — Encounter (HOSPITAL_COMMUNITY): Payer: Self-pay

## 2020-11-17 ENCOUNTER — Encounter (HOSPITAL_COMMUNITY): Payer: Medicare Other

## 2020-11-17 ENCOUNTER — Encounter (HOSPITAL_COMMUNITY)
Admission: RE | Admit: 2020-11-17 | Discharge: 2020-11-17 | Disposition: A | Discharge status: Home / Self Care | Payer: Medicare Other | Source: Ambulatory Visit | Attending: Internal Medicine | Admitting: Internal Medicine

## 2020-11-17 VITALS — BP 122/68 | HR 59 | Resp 16 | Ht 69.75 in | Wt 147.0 lb

## 2020-11-17 DIAGNOSIS — J438 Other emphysema: Secondary | ICD-10-CM | POA: Diagnosis not present

## 2020-11-17 NOTE — Progress Notes (Signed)
Travis Salinas 70 y.o. male Pulmonary Rehab Orientation Note Travis Salinas who is known by pulmonary rehab staff from previous participation 2019,  referred to Pulmonary rehab by Dr. Chase Caller with the diagnosis of Pulmonary Emphysema arrived today accompanied by his wife, Travis Salinas to Cardiac and Pulmonary Rehab. He arrived ambulatory with no assistive device but has a cane and Rolator.  Pt with multiple falls related to tripping.  Travis Salinas was scheduled back in October but suffered a fall and broke a few ribs.  Pt has recovered.   He does not carry portable oxygen nor use a home concentrator. Per pt, he uses oxygen never. Color good, skin warm and dry. Patient is oriented to person and place however he at times is a bit fuzzy regarding time and series of events that happen in the past. Patient's medical history, psychosocial health, and medications reviewed with his ex wife who is a retired Marine scientist. Psychosocial assessment reveals pt lives with an adult companionroommate. Pt is currently retired. Pt hobbies include watching TV and prior enjoyed walking which for him he doesn't do as much due to shortness of breath. Pt reports his stress level is moderate. Areas of stress/anxiety include Health.  Pt does not drive and is unable to get out and about unless a friend were to take him or his roommate who works part time is available.  This causes him to feel stressed.  Pt at times exhibit signs of depression.  Pt with prior history and admits to having "blue" days here and there. When he feels this way he tries to get out of the house and sit on his deck. Signs of depression include anxiety and fear about his shortness of breath. PHQ2/9 score 1/14. Pt shows good  coping skills with positive outlook.  Travis Salinas is looking forward to returning to exercise so that he can increase his energy and stamina. Will continue to monitor and evaluate progress toward psychosocial goal(s) of wanting to "feel better".. Physical assessment reveals  heart rate is normal, breath sounds clear to auscultation, no wheezes, rales, or rhonchi but is diminished. Grip strength equal, strong. Distal pulses palpable. Patient reports he does take medications as prescribed. Patient states he follows a Regular diet. The patient has been trying to gain weight by eating sweet snacks .Marland Kitchen Patient's weight will be monitored closely. Demonstration and practice of PLB using pulse oximeter. Patient able to return demonstration satisfactorily. Safety and hand hygiene in the exercise area reviewed with patient. Patient voices understanding of the information reviewed. Department expectations discussed with patient and achievable goals were set. The patient shows enthusiasm about attending the program and we look forward to working with Travis Salinas/ The patient is scheduled for a 6 min walk test at later time and to begin exercise upon the MD signature of his treatment plan.  Pt ex wife aware she will receive a call hopefully Monday to schedule a time. 45 minutes was spent on a variety of activities such as assessment of the patient, obtaining baseline data including height, weight, BMI, and grip strength, verifying medical history, allergies, and current medications, and teaching patient strategies for performing tasks with less respiratory effort with emphasis on pursed lip breathing. Travis Salinas, BSN Cardiac and Training and development officer  .

## 2020-11-18 LAB — COMPREHENSIVE METABOLIC PANEL

## 2020-11-21 ENCOUNTER — Encounter (HOSPITAL_COMMUNITY)
Admission: RE | Admit: 2020-11-21 | Discharge: 2020-11-21 | Disposition: A | Discharge status: Home / Self Care | Payer: Medicare Other | Source: Ambulatory Visit | Attending: Internal Medicine | Admitting: Internal Medicine

## 2020-11-21 ENCOUNTER — Other Ambulatory Visit: Payer: Self-pay

## 2020-11-21 ENCOUNTER — Other Ambulatory Visit (HOSPITAL_COMMUNITY): Payer: Self-pay | Admitting: Sports Medicine

## 2020-11-21 DIAGNOSIS — I10 Essential (primary) hypertension: Secondary | ICD-10-CM | POA: Diagnosis not present

## 2020-11-21 DIAGNOSIS — J438 Other emphysema: Secondary | ICD-10-CM

## 2020-11-21 DIAGNOSIS — N400 Enlarged prostate without lower urinary tract symptoms: Secondary | ICD-10-CM | POA: Diagnosis not present

## 2020-11-21 DIAGNOSIS — Z125 Encounter for screening for malignant neoplasm of prostate: Secondary | ICD-10-CM | POA: Diagnosis not present

## 2020-11-21 DIAGNOSIS — Z0000 Encounter for general adult medical examination without abnormal findings: Secondary | ICD-10-CM | POA: Diagnosis not present

## 2020-11-21 NOTE — Progress Notes (Signed)
Pulmonary Individual Treatment Plan  Patient Details  Name: Anselmo Reihl MRN: [id number] Date of Birth: 1951/01/10 Referring Provider:   April Manson Pulmonary Rehab Walk Test from 11/21/2020 in [place]  Referring Provider Dr. Chase Caller      Initial Encounter Date:  Flowsheet Row Pulmonary Rehab Walk Test from 11/21/2020 in [place]  Date 11/21/20      Visit Diagnosis: Other emphysema ([place])  Patient's Home Medications on Admission:   Current Outpatient Medications:  .  albuterol (VENTOLIN HFA) 108 (90 Base) MCG/ACT inhaler, Inhale 2 puffs into the lungs every 6 (six) hours as needed for wheezing or shortness of breath., Disp: 6.7 g, Rfl: 0 .  alprazolam (XANAX) 2 MG tablet, Take 1 mg by mouth 3 (three) times daily as needed for sleep or anxiety., Disp: , Rfl:  .  Amantadine HCl 100 MG tablet, Take 100 mg by mouth 2 (two) times daily., Disp: , Rfl:  .  amiodarone (PACERONE) 100 MG tablet, TAKE 1 TABLET BY MOUTH DAILY, Disp: 90 tablet, Rfl: 3 .  ANORO ELLIPTA 62.5-25 MCG/INH AEPB, INHALE 1 PUFF BY MOUTH INTO THE LUNGS DAILY, Disp: 60 each, Rfl: 3 .  ARIPiprazole (ABILIFY) 10 MG tablet, Take 1 tablet (10 mg total) by mouth daily., Disp:  , Rfl:  .  aspirin EC 81 MG EC tablet, Take 1 tablet (81 mg total) by mouth daily., Disp:  , Rfl:  .  atorvastatin (LIPITOR) 40 MG tablet, Take 1 tablet (40 mg total) by mouth daily., Disp: 90 tablet, Rfl: 3 .  clonazePAM (KLONOPIN) 0.5 MG tablet, Take 0.5 mg by mouth 2 (two) times daily as needed for anxiety., Disp: , Rfl:  .  FLUoxetine (PROZAC) 10 MG tablet, Take 1 tablet (10 mg total) by mouth daily., Disp: 30 tablet, Rfl: 3 .  fluticasone (FLONASE) 50 MCG/ACT nasal spray, Place 2 sprays into both nostrils as needed for allergies or rhinitis. , Disp: , Rfl:  .  ipratropium-albuterol (DUONEB) 0.5-2.5 (3) MG/3ML SOLN, Take 3 mLs by nebulization every 4 (four) hours as needed  (sob)., Disp: , Rfl:  .  lamoTRIgine (LAMICTAL) 200 MG tablet, Take 1 tablet (200 mg total) by mouth every morning., Disp: 30 tablet, Rfl: 0 .  losartan (COZAAR) 25 MG tablet, Take 1 tablet (25 mg total) by mouth daily., Disp: 90 tablet, Rfl: 3 .  metoprolol succinate (TOPROL-XL) 25 MG 24 hr tablet, Take 1 tablet (25 mg total) by mouth daily., Disp: 90 tablet, Rfl: 3 .  Multiple Vitamins-Minerals (MULTIVITAMIN WITH MINERALS) tablet, Take 1 tablet by mouth daily., Disp: , Rfl:  .  oxyCODONE (OXYCONTIN) 20 mg 12 hr tablet, Take 1 tablet (20 mg total) by mouth every 12 (twelve) hours., Disp: 14 tablet, Rfl: 0 .  polyethylene glycol (MIRALAX / GLYCOLAX) 17 g packet, Take 17 g by mouth daily as needed for mild constipation. , Disp: , Rfl:  .  silodosin (RAPAFLO) 8 MG CAPS capsule, Take 1 capsule (8 mg total) by mouth daily with breakfast., Disp: 30 capsule, Rfl: 0 .  topiramate (TOPAMAX) 25 MG tablet, Take 25 mg by mouth daily., Disp: , Rfl:   Past Medical History: Past Medical History:  Diagnosis Date  . Arthritis   . Cervical disc herniation   . COPD (chronic obstructive pulmonary disease) ([place])   . Headache    " due to antibiotics"  . Hepatitis C   . Hypertension     Tobacco Use:  Social History   Tobacco Use  Smoking Status Former Smoker  . Packs/day: 1.00  . Years: 32.00  . Pack years: 32.00  . Types: Cigarettes  . Start date: 56  . Quit date: 2002  . Years since quitting: 20.2  Smokeless Tobacco Never Used    Labs: Recent Chemical engineer    Labs for ITP Cardiac and Pulmonary Rehab Latest Ref Rng & Units 05/31/2017 06/14/2017 01/01/2020 01/02/2020 06/23/2020   Cholestrol 0 - 200 mg/dL - - - 151 -   LDLCALC 0 - 99 mg/dL - - - 87 -   HDL >40 mg/dL - - - 45 -   Trlycerides <150 mg/dL - - - 97 -   Hemoglobin A1c 4.8 - 5.6 % - 5.9(H) - 5.2 -   PHART 7.350 - 7.450 7.467(H) - - - 7.323(L)   PCO2ART 32.0 - 48.0 mmHg 35.3 - - - 52.9(H)   HCO3 20.0 - 28.0 mmol/L 25.1 - - -  26.7   TCO2 22 - 32 mmol/L - - 24 - -   ACIDBASEDEF 0.0 - 2.0 mmol/L - - - - -   O2SAT % 98.7 - - - 98.3      Capillary Blood Glucose: Lab Results  Component Value Date   GLUCAP 92 01/03/2020   GLUCAP 120 (H) 01/02/2020     Pulmonary Assessment Scores:  Pulmonary Assessment Scores    Row Name 11/17/20 1308 11/21/20 1602       ADL UCSD   ADL Phase Entry -    SOB Score total 35 -         CAT Score   CAT Score 13 -         mMRC Score   mMRC Score - 2          UCSD: Self-administered rating of dyspnea associated with activities of daily living (ADLs) 6-point scale (0 = "not at all" to 5 = "maximal or unable to do because of breathlessness")  Scoring Scores range from 0 to 120.  Minimally important difference is 5 units  CAT: CAT can identify the health impairment of COPD patients and is better correlated with disease progression.  CAT has a scoring range of zero to 40. The CAT score is classified into four groups of low (less than 10), medium (10 - 20), high (21-30) and very high (31-40) based on the impact level of disease on health status. A CAT score over 10 suggests significant symptoms.  A worsening CAT score could be explained by an exacerbation, poor medication adherence, poor inhaler technique, or progression of COPD or comorbid conditions.  CAT MCID is 2 points  mMRC: mMRC (Modified Medical Research Council) Dyspnea Scale is used to assess the degree of baseline functional disability in patients of respiratory disease due to dyspnea. No minimal important difference is established. A decrease in score of 1 point or greater is considered a positive change.   Pulmonary Function Assessment:   Exercise Target Goals: Exercise Program Goal: Individual exercise prescription set using results from initial 6 min walk test and THRR while considering  patient's activity barriers and safety.   Exercise Prescription Goal: Initial exercise prescription builds to 30-45  minutes a day of aerobic activity, 2-3 days per week.  Home exercise guidelines will be given to patient during program as part of exercise prescription that the participant will acknowledge.  Activity Barriers & Risk Stratification:  Activity Barriers & Cardiac Risk Stratification - 11/17/20 1052  Activity Barriers & Cardiac Risk Stratification   Activity Barriers Arthritis;Right Knee Replacement;Neck/Spine Problems;Deconditioning;Shortness of Breath;History of Falls;Balance Concerns;Assistive Device    Cardiac Risk Stratification High           6 Minute Walk:  6 Minute Walk    Row Name 11/21/20 1557         6 Minute Walk   Phase Initial     Distance 1420 feet     Walk Time 6 minutes     # of Rest Breaks 0     MPH 2.69     METS 3.73     RPE 11     Perceived Dyspnea  1     VO2 Peak 13.06     Symptoms Yes (comment)     Comments Hip pain rate 3/10     Resting HR 67 bpm     Resting BP 140/68     Resting Oxygen Saturation  96 %     Exercise Oxygen Saturation  during 6 min walk 94 %     Max Ex. HR 86 bpm     Max Ex. BP 164/70     2 Minute Post BP 156/78           Interval HR   1 Minute HR 77     2 Minute HR 78     3 Minute HR 81     4 Minute HR 85     5 Minute HR 86     6 Minute HR 85     2 Minute Post HR 68     Interval Heart Rate? Yes           Interval Oxygen   Interval Oxygen? Yes     Baseline Oxygen Saturation % 96 %     1 Minute Oxygen Saturation % 95 %     1 Minute Liters of Oxygen 0 L     2 Minute Oxygen Saturation % 94 %     2 Minute Liters of Oxygen 0 L     3 Minute Oxygen Saturation % 95 %     3 Minute Liters of Oxygen 0 L     4 Minute Oxygen Saturation % 95 %     4 Minute Liters of Oxygen 0 L     5 Minute Oxygen Saturation % 95 %     5 Minute Liters of Oxygen 0 L     6 Minute Oxygen Saturation % 95 %     6 Minute Liters of Oxygen 0 L     2 Minute Post Oxygen Saturation % 98 %     2 Minute Post Liters of Oxygen 0 L            Oxygen  Initial Assessment:  Oxygen Initial Assessment - 11/21/20 1602      Home Oxygen   Home Oxygen Device None    Sleep Oxygen Prescription None    Home Exercise Oxygen Prescription None    Home Resting Oxygen Prescription None      Initial 6 min Walk   Oxygen Used None      Program Oxygen Prescription   Program Oxygen Prescription None      Intervention   Short Term Goals To learn and understand importance of monitoring SPO2 with pulse oximeter and demonstrate accurate use of the pulse oximeter.;To learn and understand importance of maintaining oxygen saturations>88%;To learn and demonstrate proper pursed lip breathing techniques or other breathing techniques.;To learn and  demonstrate proper use of respiratory medications    Long  Term Goals Verbalizes importance of monitoring SPO2 with pulse oximeter and return demonstration;Maintenance of O2 saturations>88%;Exhibits proper breathing techniques, such as pursed lip breathing or other method taught during program session;Compliance with respiratory medication;Demonstrates proper use of MDI's           Oxygen Re-Evaluation:   Oxygen Discharge (Final Oxygen Re-Evaluation):   Initial Exercise Prescription:  Initial Exercise Prescription - 11/21/20 1600      Date of Initial Exercise RX and Referring Provider   Date 11/21/20    Referring Provider Dr. Chase Caller    Expected Discharge Date 01/23/21      NuStep   Level 1    SPM 80    Minutes 30      Prescription Details   Frequency (times per week) 2    Duration Progress to 30 minutes of continuous aerobic without signs/symptoms of physical distress      Intensity   THRR 40-80% of Max Heartrate 61-122    Ratings of Perceived Exertion 11-13    Perceived Dyspnea 0-4      Progression   Progression Continue to progress workloads to maintain intensity without signs/symptoms of physical distress.      Resistance Training   Training Prescription Yes    Weight orange bands    Reps  10-15           Perform Capillary Blood Glucose checks as needed.  Exercise Prescription Changes:   Exercise Comments:   Exercise Goals and Review:  Exercise Goals    Row Name 11/17/20 1100             Exercise Goals   Increase Physical Activity Yes       Intervention Provide advice, education, support and counseling about physical activity/exercise needs.;Develop an individualized exercise prescription for aerobic and resistive training based on initial evaluation findings, risk stratification, comorbidities and participant's personal goals.       Expected Outcomes Short Term: Attend rehab on a regular basis to increase amount of physical activity.;Long Term: Add in home exercise to make exercise part of routine and to increase amount of physical activity.;Long Term: Exercising regularly at least 3-5 days a week.       Increase Strength and Stamina Yes       Intervention Provide advice, education, support and counseling about physical activity/exercise needs.;Develop an individualized exercise prescription for aerobic and resistive training based on initial evaluation findings, risk stratification, comorbidities and participant's personal goals.       Expected Outcomes Short Term: Increase workloads from initial exercise prescription for resistance, speed, and METs.;Short Term: Perform resistance training exercises routinely during rehab and add in resistance training at home;Long Term: Improve cardiorespiratory fitness, muscular endurance and strength as measured by increased METs and functional capacity (6MWT)       Able to understand and use rate of perceived exertion (RPE) scale Yes       Intervention Provide education and explanation on how to use RPE scale       Expected Outcomes Short Term: Able to use RPE daily in rehab to express subjective intensity level;Long Term:  Able to use RPE to guide intensity level when exercising independently       Able to understand and use  Dyspnea scale Yes       Intervention Provide education and explanation on how to use Dyspnea scale       Expected Outcomes Short Term: Able to use Dyspnea  scale daily in rehab to express subjective sense of shortness of breath during exertion;Long Term: Able to use Dyspnea scale to guide intensity level when exercising independently       Knowledge and understanding of Target Heart Rate Range (THRR) Yes       Intervention Provide education and explanation of THRR including how the numbers were predicted and where they are located for reference       Expected Outcomes Short Term: Able to state/look up THRR;Short Term: Able to use daily as guideline for intensity in rehab;Long Term: Able to use THRR to govern intensity when exercising independently       Understanding of Exercise Prescription Yes       Intervention Provide education, explanation, and written materials on patient's individual exercise prescription       Expected Outcomes Short Term: Able to explain program exercise prescription;Long Term: Able to explain home exercise prescription to exercise independently              Exercise Goals Re-Evaluation :   Discharge Exercise Prescription (Final Exercise Prescription Changes):   Nutrition:  Target Goals: Understanding of nutrition guidelines, daily intake of sodium 1500mg , cholesterol 200mg , calories 30% from fat and 7% or less from saturated fats, daily to have 5 or more servings of fruits and vegetables.  Biometrics:  Pre Biometrics - 11/21/20 1557      Pre Biometrics   Grip Strength 30 kg            Nutrition Therapy Plan and Nutrition Goals:   Nutrition Assessments:  MEDIFICTS Score Key:  ?70 Need to make dietary changes   40-70 Heart Healthy Diet  ? 40 Therapeutic Level Cholesterol Diet   Picture Your Plate Scores:  <09 Unhealthy dietary pattern with much room for improvement.  41-50 Dietary pattern unlikely to meet recommendations for good health  and room for improvement.  51-60 More healthful dietary pattern, with some room for improvement.   >60 Healthy dietary pattern, although there may be some specific behaviors that could be improved.    Nutrition Goals Re-Evaluation:   Nutrition Goals Discharge (Final Nutrition Goals Re-Evaluation):   Psychosocial: Target Goals: Acknowledge presence or absence of significant depression and/or stress, maximize coping skills, provide positive support system. Participant is able to verbalize types and ability to use techniques and skills needed for reducing stress and depression.  Initial Review & Psychosocial Screening:  Initial Psych Review & Screening - 11/17/20 1200      Initial Review   Comments Pt complains of feeling bored.  He does not drive and has to depend upon others to get out of the home.  Pt would like to do more but his shortness of breath keeps him from do much physical activity. Pt feel his medications work for him.      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 Setting desired goal for each stressor or current issue identified.;Long Term goal: The participant improves quality of Life and PHQ9 Scores as seen by post scores and/or verbalization of changes;Short Term goal: Identification and review with participant of any Quality of Life or Depression concerns found by scoring the questionnaire.           Quality of Life Scores:  Scores of  19 and below usually indicate a poorer quality of life in these areas.  A difference of  2-3 points is a clinically meaningful difference.  A difference of 2-3 points in the total score of the Quality of Life Index has been associated with significant improvement in overall quality of  life, self-image, physical symptoms, and general health in studies assessing change in quality of life.  PHQ-9: Recent Review Flowsheet Data    Depression screen [facility] 2/9 11/17/2020 11/17/2020 02/03/2020 07/21/2018 04/06/2018   Decreased Interest 0 0 2 1 1    Down, Depressed, Hopeless 1 1 3 1 2    PHQ - 2 Score 1 1 5 2 3    Altered sleeping 3 - 2 0 1   Tired, decreased energy 3 - 1 1 3    Change in appetite 3 - 0 0 0   Feeling bad or failure about yourself  0 - 2 0 1   Trouble concentrating 1 - 0 0 0   Moving slowly or fidgety/restless 3 - 1 1 1    Suicidal thoughts 0 - 0 0 0   PHQ-9 Score 14 - 11 4 9    Difficult doing work/chores - - Somewhat difficult Somewhat difficult Somewhat difficult     Interpretation of Total Score  Total Score Depression Severity:  1-4 = Minimal depression, 5-9 = Mild depression, 10-14 = Moderate depression, 15-19 = Moderately severe depression, 20-27 = Severe depression   Psychosocial Evaluation and Intervention:  Psychosocial Evaluation - 11/17/20 1116      Psychosocial Evaluation & Interventions   Interventions Encouraged to exercise with the program and follow exercise prescription;Stress management education;Relaxation education           Psychosocial Re-Evaluation:   Psychosocial Discharge (Final Psychosocial Re-Evaluation):   Education: Education Goals: Education classes will be provided on a weekly basis, covering required topics. Participant will state understanding/return demonstration of topics presented.  Learning Barriers/Preferences:  Learning Barriers/Preferences - 11/17/20 1116      Learning Barriers/Preferences   Learning Barriers Sight;Hearing    Learning Preferences Verbal Instruction;Group Instruction;Individual Instruction           Education Topics: Risk Factor Reduction:  -Group instruction that is supported by a PowerPoint presentation. Instructor discusses the definition of a risk factor, different risk factors for  pulmonary disease, and how the heart and lungs work together.     Nutrition for Pulmonary Patient:  -Group instruction provided by PowerPoint slides, verbal discussion, and written materials to support subject matter. The instructor gives an explanation and review of healthy diet recommendations, which includes a discussion on weight management, recommendations for fruit and vegetable consumption, as well as protein, fluid, caffeine, fiber, sodium, sugar, and alcohol. Tips for eating when patients are short of breath are discussed. Flowsheet Row PULMONARY REHAB OTHER RESPIRATORY from 07/16/2018 in [place]  Date 05/07/18  Educator Rodman Pickle  Instruction Review Code 2- Demonstrated Understanding      Pursed Lip Breathing:  -Group instruction that is supported by demonstration and informational handouts. Instructor discusses the benefits of pursed lip and diaphragmatic breathing and detailed demonstration on how to preform both.     Oxygen Safety:  -Group instruction provided by PowerPoint, verbal discussion, and written material to support subject matter. There is an overview of "What is Oxygen" and "Why do we need it".  Instructor also reviews how to create a safe environment for oxygen use, the importance of using oxygen as prescribed, and the risks of noncompliance. There is  a brief discussion on traveling with oxygen and resources the patient may utilize. Flowsheet Row PULMONARY REHAB OTHER RESPIRATORY from 07/16/2018 in [place]  Date 06/11/18  Educator Cloyde Reams  Instruction Review Code 2- Demonstrated Understanding      Oxygen Equipment:  -Group instruction provided by [facility] Staff utilizing handouts, written materials, and equipment demonstrations.   Signs and Symptoms:  -Group instruction provided by written material and verbal discussion to support subject matter. Warning signs and symptoms of infection, stroke,  and heart attack are reviewed and when to call the physician/911 reinforced. Tips for preventing the spread of infection discussed. Flowsheet Row PULMONARY REHAB OTHER RESPIRATORY from 07/16/2018 in [place]  Date 06/04/18  Educator rn  Instruction Review Code 2- Demonstrated Understanding      Advanced Directives:  -Group instruction provided by verbal instruction and written material to support subject matter. Instructor reviews Advanced Directive laws and proper instruction for filling out document.   Pulmonary Video:  -Group video education that reviews the importance of medication and oxygen compliance, exercise, good nutrition, pulmonary hygiene, and pursed lip and diaphragmatic breathing for the pulmonary patient.   Exercise for the Pulmonary Patient:  -Group instruction that is supported by a PowerPoint presentation. Instructor discusses benefits of exercise, core components of exercise, frequency, duration, and intensity of an exercise routine, importance of utilizing pulse oximetry during exercise, safety while exercising, and options of places to exercise outside of rehab.     Pulmonary Medications:  -Verbally interactive group education provided by instructor with focus on inhaled medications and proper administration. Flowsheet Row PULMONARY REHAB OTHER RESPIRATORY from 07/16/2018 in [place]  Date 05/19/18  Educator pharm  Instruction Review Code 2- Demonstrated Understanding      Anatomy and Physiology of the Respiratory System and Intimacy:  -Group instruction provided by PowerPoint, verbal discussion, and written material to support subject matter. Instructor reviews respiratory cycle and anatomical components of the respiratory system and their functions. Instructor also reviews differences in obstructive and restrictive respiratory diseases with examples of each. Intimacy, Sex, and Sexuality differences  are reviewed with a discussion on how relationships can change when diagnosed with pulmonary disease. Common sexual concerns are reviewed. Flowsheet Row PULMONARY REHAB OTHER RESPIRATORY from 07/16/2018 in [place]  Date 07/16/18  Educator RN  Instruction Review Code 2- Demonstrated Understanding      MD DAY -A group question and answer session with a medical doctor that allows participants to ask questions that relate to their pulmonary disease state. Flowsheet Row PULMONARY REHAB OTHER RESPIRATORY from 07/16/2018 in [place]  Date 04/14/18  Educator Dr. Nelda Marseille  Instruction Review Code 1- Verbalizes Understanding      OTHER EDUCATION -Group or individual verbal, written, or video instructions that support the educational goals of the pulmonary rehab program. [place] OTHER RESPIRATORY from 07/16/2018 in [place]  Date 05/28/18  Educator Cloyde Reams  Instruction Review Code 1- Verbalizes Understanding  [Sedentary Lifestyle]      Holiday Eating Survival Tips:  -Group instruction provided by [employer], verbal discussion, and written materials to support subject matter. The instructor gives patients tips, tricks, and techniques to help them not only survive but enjoy the holidays despite the onslaught of food that accompanies the holidays.   Knowledge Questionnaire Score:  Knowledge Questionnaire Score - 11/17/20 1210  Knowledge Questionnaire Score   Pre Score 15/18           Core Components/Risk Factors/Patient Goals at Admission:  Personal Goals and Risk Factors at Admission - 11/17/20 1116      Core Components/Risk Factors/Patient Goals on Admission    Weight Management Weight Gain;Yes    Expected Outcomes Weight Gain: Understanding of general recommendations for a high calorie, high protein meal plan that promotes weight gain by distributing  calorie intake throughout the day with the consumption for 4-5 meals, snacks, and/or supplements;Short Term: Continue to assess and modify interventions until short term weight is achieved;Long Term: Adherence to nutrition and physical activity/exercise program aimed toward attainment of established weight goal;Understanding recommendations for meals to include 15-35% energy as protein, 25-35% energy from fat, 35-60% energy from carbohydrates, less than 200mg  of dietary cholesterol, 20-35 gm of total fiber daily;Understanding of distribution of calorie intake throughout the day with the consumption of 4-5 meals/snacks    Improve shortness of breath with ADL's Yes    Intervention Provide education, individualized exercise plan and daily activity instruction to help decrease symptoms of SOB with activities of daily living.    Expected Outcomes Short Term: Improve cardiorespiratory fitness to achieve a reduction of symptoms when performing ADLs;Long Term: Be able to perform more ADLs without symptoms or delay the onset of symptoms    Hypertension Yes    Intervention Provide education on lifestyle modifcations including regular physical activity/exercise, weight management, moderate sodium restriction and increased consumption of fresh fruit, vegetables, and low fat dairy, alcohol moderation, and smoking cessation.;Monitor prescription use compliance.    Expected Outcomes Short Term: Continued assessment and intervention until BP is < 140/85mm HG in hypertensive participants. < 130/40mm HG in hypertensive participants with diabetes, heart failure or chronic kidney disease.;Long Term: Maintenance of blood pressure at goal levels.    Stress Yes    Intervention Offer individual and/or small group education and counseling on adjustment to heart disease, stress management and health-related lifestyle change. Teach and support self-help strategies.           Core Components/Risk Factors/Patient Goals Review:     Core Components/Risk Factors/Patient Goals at Discharge (Final Review):    ITP Comments:   Comments:

## 2020-11-23 ENCOUNTER — Encounter (HOSPITAL_COMMUNITY)
Admission: RE | Admit: 2020-11-23 | Discharge: 2020-11-23 | Disposition: A | Discharge status: Home / Self Care | Payer: Medicare Other | Source: Ambulatory Visit | Attending: Internal Medicine | Admitting: Internal Medicine

## 2020-11-23 ENCOUNTER — Other Ambulatory Visit: Payer: Medicare Other

## 2020-11-23 ENCOUNTER — Other Ambulatory Visit: Payer: Self-pay

## 2020-11-23 DIAGNOSIS — N529 Male erectile dysfunction, unspecified: Secondary | ICD-10-CM | POA: Diagnosis not present

## 2020-11-23 DIAGNOSIS — N400 Enlarged prostate without lower urinary tract symptoms: Secondary | ICD-10-CM | POA: Diagnosis not present

## 2020-11-23 DIAGNOSIS — I10 Essential (primary) hypertension: Secondary | ICD-10-CM | POA: Diagnosis not present

## 2020-11-23 DIAGNOSIS — J438 Other emphysema: Secondary | ICD-10-CM | POA: Diagnosis not present

## 2020-11-23 DIAGNOSIS — Z0000 Encounter for general adult medical examination without abnormal findings: Secondary | ICD-10-CM | POA: Diagnosis not present

## 2020-11-23 NOTE — Progress Notes (Signed)
Daily Session Note  Patient Details  Name: Travis Salinas MRN: [id number] Date of Birth: 05-23-1951 Referring Provider:   April Manson Pulmonary Rehab Walk Test from 11/21/2020 in [place]  Referring Provider Dr. Chase Caller      Encounter Date: 11/23/2020  Check In:  Session Check In - 11/23/20 1518      Check-In   Supervising physician immediately available to respond to emergencies [facility] immediately available    Physician(s) Dr. Florencia Reasons    Location [facility]    Staff Present Rosebud Poles, RN, Milus Glazier, MS, EP-C, CCRP;Jessica Hassell Done, MS, ACSM-CEP, Exercise Physiologist    Virtual Visit No    Medication changes reported     No    Fall or balance concerns reported    No    Tobacco Cessation No Change    Warm-up and Cool-down Performed on first and last piece of equipment    Resistance Training Performed No    VAD Patient? No    PAD/SET Patient? No      Pain Assessment   Currently in Pain? No/denies    Multiple Pain Sites No           Capillary Blood Glucose: No results found for this or any previous visit (from the past 24 hour(s)).    Social History   Tobacco Use  Smoking Status Former Smoker  . Packs/day: 1.00  . Years: 32.00  . Pack years: 32.00  . Types: Cigarettes  . Start date: 68  . Quit date: 2002  . Years since quitting: 20.2  Smokeless Tobacco Never Used    Goals Met:  Proper associated with RPD/PD & O2 Sat Patient was very exhausted and fatigued when he arrived so he was only able to do 25 minutes on the Nustep and the cool down stretches.   Goals Unmet:  Was not able to do resistance training today  Comments: Service time is from 1400 to 18    Dr. Fransico Him is Medical Director for Cardiac Rehab at [facility].

## 2020-11-24 ENCOUNTER — Other Ambulatory Visit: Payer: Medicare Other

## 2020-11-28 ENCOUNTER — Encounter (HOSPITAL_COMMUNITY)
Admission: RE | Admit: 2020-11-28 | Discharge: 2020-11-28 | Disposition: A | Discharge status: Home / Self Care | Payer: Medicare Other | Source: Ambulatory Visit | Attending: Internal Medicine | Admitting: Internal Medicine

## 2020-11-28 ENCOUNTER — Other Ambulatory Visit: Payer: Self-pay

## 2020-11-28 VITALS — Wt 148.1 lb

## 2020-11-28 DIAGNOSIS — J438 Other emphysema: Secondary | ICD-10-CM | POA: Diagnosis not present

## 2020-11-28 NOTE — Progress Notes (Signed)
Daily Session Note  Patient Details  Name: Abimael Zeiter MRN: [id number] Date of Birth: 1951-08-14 Referring Provider:   April Manson Pulmonary Rehab Walk Test from 11/21/2020 in [place]  Referring Provider Dr. Chase Caller      Encounter Date: 11/28/2020  Check In:  Session Check In - 11/28/20 1507      Check-In   Supervising physician immediately available to respond to emergencies [facility] immediately available    Physician(s) Dr. [name]    [facility]    Staff Present Rosebud Poles, RN, BSN;Lisa Ysidro Evert, RN;Jayma Volpi Hassell Done, MS, ACSM-CEP, Exercise Physiologist    Virtual Visit No    Medication changes reported     No    Fall or balance concerns reported    No    Tobacco Cessation No Change    Warm-up and Cool-down Performed on first and last piece of equipment    Resistance Training Performed Yes    VAD Patient? No    PAD/SET Patient? No      Pain Assessment   Currently in Pain? No/denies    Pain Score 0-No pain    Multiple Pain Sites No           Capillary Blood Glucose: No results found for this or any previous visit (from the past 24 hour(s)).   Exercise Prescription Changes - 11/28/20 1500      Response to Exercise   Blood Pressure (Admit) 144/80    Blood Pressure (Exercise) 140/60    Blood Pressure (Exit) 152/80    Heart Rate (Admit) 62 bpm    Heart Rate (Exercise) 86 bpm    Heart Rate (Exit) 62 bpm    Oxygen Saturation (Admit) 97 %    Oxygen Saturation (Exercise) 93 %    Oxygen Saturation (Exit) 96 %    Rating of Perceived Exertion (Exercise) 9    Perceived Dyspnea (Exercise) 1    Duration Progress to 30 minutes of  aerobic without signs/symptoms of physical distress    Intensity Other (comment)   40-80% HR max     Progression   Progression Continue to progress workloads to maintain intensity without signs/symptoms of physical distress.      Resistance Training   Training  Prescription Yes    Weight orange bands    Reps 10-15    Time 10 Minutes      NuStep   Level 2    SPM 80    Minutes 30    METs 2.4           Social History   Tobacco Use  Smoking Status Former Smoker  . Packs/day: 1.00  . Years: 32.00  . Pack years: 32.00  . Types: Cigarettes  . Start date: 82  . Quit date: 2002  . Years since quitting: 20.2  Smokeless Tobacco Never Used    Goals Met:  Proper associated with RPD/PD & O2 Sat Exercise tolerated well Strength training completed today  Goals Unmet:  Not Applicable  Comments: Service time is from 1355 to 56    Dr. Fransico Him is Medical Director for Cardiac Rehab at [facility].

## 2020-11-30 ENCOUNTER — Other Ambulatory Visit: Payer: Self-pay

## 2020-11-30 ENCOUNTER — Encounter (HOSPITAL_COMMUNITY)
Admission: RE | Admit: 2020-11-30 | Discharge: 2020-11-30 | Disposition: A | Discharge status: Home / Self Care | Payer: Medicare Other | Source: Ambulatory Visit | Attending: Internal Medicine | Admitting: Internal Medicine

## 2020-11-30 DIAGNOSIS — J438 Other emphysema: Secondary | ICD-10-CM | POA: Diagnosis not present

## 2020-11-30 NOTE — Progress Notes (Signed)
Daily Session Note  Patient Details  Name: Kyrie Fludd MRN: [id number] Date of Birth: 04-20-1951 Referring Provider:   April Manson Pulmonary Rehab Walk Test from 11/21/2020 in [place]  Referring Provider Dr. Chase Caller      Encounter Date: 11/30/2020  Check In:  Session Check In - 11/30/20 1430      Check-In   Supervising physician immediately available to respond to emergencies [facility] immediately available    Physician(s) Dr. [name]    [facility]    Staff Present Rosebud Poles, RN, BSN;Guyla Bless Ysidro Evert, RN;Jessica Hassell Done, MS, ACSM-CEP, Exercise Physiologist    Virtual Visit No    Medication changes reported     No    Fall or balance concerns reported    No    Tobacco Cessation No Change    Warm-up and Cool-down Performed on first and last piece of equipment    Resistance Training Performed Yes    VAD Patient? No    PAD/SET Patient? No      Pain Assessment   Currently in Pain? No/denies    Multiple Pain Sites No           Capillary Blood Glucose: No results found for this or any previous visit (from the past 24 hour(s)).    Social History   Tobacco Use  Smoking Status Former Smoker  . Packs/day: 1.00  . Years: 32.00  . Pack years: 32.00  . Types: Cigarettes  . Start date: 70  . Quit date: 2002  . Years since quitting: 20.2  Smokeless Tobacco Never Used    Goals Met:  Exercise tolerated well No report of cardiac concerns or symptoms Strength training completed today  Goals Unmet:  Not Applicable  Comments: Service time is from 1408 to 66    Dr. Fransico Him is Medical Director for Cardiac Rehab at [facility].

## 2020-12-03 NOTE — Progress Notes (Addendum)
Cardiology Office Note:    Date:  12/04/2020   ID:  Travis Salinas, DOB 1951-03-02, MRN [id number]  PCP:  Kathyrn Lass, [facility]  Cardiologist:  Mertie Moores, MD   Advanced Practice Provider:  Liliane Shi, PA-C Electrophysiologist:  None       Referring MD: Kathyrn Lass, MD   Chief Complaint:  Follow-up (HTN)    Patient Profile:    Travis Salinas is a 70 y.o. male with:   Polysubstance abuse hx (cocaine, heroin)  COPD  Hep C  Hypertension   Hyperlipidemia   Chronic kidney disease   Hx of CVA 4/21 in setting of cocaine use; tx w tPA ? WCT/Paroxysmal Atrial fibrillation + Echo w EF 35-40 (c/w Tako-tsubo CM)  Amiodarone Rx  Tako-Tsubo CM; EF 35-40 in 12/2019 ? F/u Echocardiogram 8/21: EF 60-65, Gr 1 DD  Chronic kidney disease  Prior CV studies: Echocardiogram 04/19/20 EF 60-65, mild LVH, Gr 1 DD, normal RVSF, trivial MR  Carotid US 01/03/20 No sig ICA stenosis  Echocardiogram 01/02/20 EF 35-40, mod LVH, Gr 1 DD, normal RVSF, trivial MR, apical AK   Myoview 10/04/16 Apical thinning, normal perfusion, EF 58; low risk     History of Present Illness:    Travis Salinas was last seen by me in 2/22.  I restarted his Losartan for better BP control.  He returns for f/u.  He is here with his ex-wife.  He is overall doing well.  He has been to pulmonary rehab a few times.  He feels he is getting stronger.  He has not had chest pain.  He is chronically short of breath.  He has not had orthopnea, leg edema or syncope.  Blood pressures are 130s-140s at home.        Past Medical History:  Diagnosis Date  . Arthritis   . Cervical disc herniation   . COPD (chronic obstructive pulmonary disease) ([place])   . Headache    " due to antibiotics"  . Hepatitis C   . Hypertension     Current Medications: Current Meds  Medication Sig  . albuterol (VENTOLIN HFA) 108 (90 Base) MCG/ACT inhaler Inhale 2 puffs into the lungs every 6  (six) hours as needed for wheezing or shortness of breath.  . alprazolam (XANAX) 2 MG tablet Take 1 mg by mouth 3 (three) times daily as needed for sleep or anxiety.  . Amantadine HCl 100 MG tablet Take 100 mg by mouth 2 (two) times daily.  Jearl Klinefelter ELLIPTA 62.5-25 MCG/INH AEPB INHALE 1 PUFF BY MOUTH INTO THE LUNGS DAILY  . ARIPiprazole (ABILIFY) 10 MG tablet Take 1 tablet (10 mg total) by mouth daily.  Marland Kitchen aspirin EC 81 MG EC tablet Take 1 tablet (81 mg total) by mouth daily.  Marland Kitchen atorvastatin (LIPITOR) 40 MG tablet Take 1 tablet (40 mg total) by mouth daily.  . clonazePAM (KLONOPIN) 0.5 MG tablet Take 0.5 mg by mouth 2 (two) times daily as needed for anxiety.  Marland Kitchen FLUoxetine (PROZAC) 10 MG tablet Take 1 tablet (10 mg total) by mouth daily.  . fluticasone (FLONASE) 50 MCG/ACT nasal spray Place 2 sprays into both nostrils as needed for allergies or rhinitis.   Marland Kitchen ipratropium-albuterol (DUONEB) 0.5-2.5 (3) MG/3ML SOLN Take 3 mLs by nebulization every 4 (four) hours as needed (sob).  Marland Kitchen lamoTRIgine (LAMICTAL) 200 MG tablet Take 1 tablet (200 mg total) by mouth every morning.  Marland Kitchen losartan (COZAAR)  50 MG tablet Take 1 tablet (50 mg total) by mouth daily.  . metoprolol succinate (TOPROL-XL) 25 MG 24 hr tablet Take 1 tablet (25 mg total) by mouth daily.  . Multiple Vitamins-Minerals (MULTIVITAMIN WITH MINERALS) tablet Take 1 tablet by mouth daily.  Marland Kitchen oxyCODONE (OXYCONTIN) 20 mg 12 hr tablet Take 1 tablet (20 mg total) by mouth every 12 (twelve) hours.  . polyethylene glycol (MIRALAX / GLYCOLAX) 17 g packet Take 17 g by mouth daily as needed for mild constipation.   . silodosin (RAPAFLO) 8 MG CAPS capsule Take 1 capsule (8 mg total) by mouth daily with breakfast.  . topiramate (TOPAMAX) 25 MG tablet Take 25 mg by mouth daily.  . [DISCONTINUED] amiodarone (PACERONE) 100 MG tablet TAKE 1 TABLET BY MOUTH DAILY  . [DISCONTINUED] losartan (COZAAR) 25 MG tablet Take 1 tablet (25 mg total) by mouth daily.      Allergies:   Propoxyphene, Amoxicillin, Ciprofloxacin, and Depakote [divalproex sodium]   Social History   Tobacco Use  . Smoking status: Former Smoker    Packs/day: 1.00    Years: 32.00    Pack years: 32.00    Types: Cigarettes    Start date: 1968    Quit date: 2002    Years since quitting: 20.2  . Smokeless tobacco: Never Used  Vaping Use  . Vaping Use: Never used  Substance Use Topics  . Alcohol use: No    Alcohol/week: 0.0 standard drinks  . Drug use: Not Currently    Types: Heroin, Cocaine    Comment: " relapsed once this year " 2018     Family Hx: The patient's family history includes Heart disease in his father and mother; Stroke in his father and mother.  ROS   EKGs/Labs/Other Test Reviewed:    EKG:  EKG is   ordered today.  The ekg ordered today demonstrates normal sinus rhythm, heart rate 67, left axis deviation, poor R wave progression, QTC 462, similar to prior tracings  Recent Labs: 01/02/2020: Magnesium 1.9; TSH 1.235 06/23/2020: Hemoglobin 13.9; Platelets PLATELET CLUMPS NOTED ON SMEAR, UNABLE TO ESTIMATE 11/14/2020: ALT 38; BUN 32; Creatinine, Ser 1.38; Potassium 4.6; Sodium 146   Recent Lipid Panel Lab Results  Component Value Date/Time   CHOL 151 01/02/2020 03:29 AM   TRIG 97 01/02/2020 03:29 AM   HDL 45 01/02/2020 03:29 AM   CHOLHDL 3.4 01/02/2020 03:29 AM   LDLCALC 87 01/02/2020 03:29 AM   Labs from PCP - personally reviewed and interpreted: 11/23/2020: Creatinine 1.08, K+ 4.4, ALT 34, chloride 107, BUN 22, Hgb 13.1, total cholesterol 110, HDL 53, triglycerides 52, LDL 45   Risk Assessment/Calculations:    CHA2DS2-VASc Score = 5  This indicates a 7.2% annual risk of stroke. The patient's score is based upon: CHF History: Yes HTN History: Yes Diabetes History: No Stroke History: Yes Vascular Disease History: No Age Score: 1 Gender Score: 0     Physical Exam:    VS:  BP 130/74   Pulse 65   Ht 5' 9.75" (1.772 m)   Wt 148 lb (67.1  kg)   BMI 21.39 kg/m     Wt Readings from Last 3 Encounters:  12/04/20 148 lb (67.1 kg)  11/28/20 148 lb 2.4 oz (67.2 kg)  11/17/20 147 lb 0.8 oz (66.7 kg)     Constitutional:      Appearance: Not in distress. Frail.  Neck:     Vascular: No JVR.  Pulmonary:     Effort: Pulmonary  effort is normal.     Breath sounds: No wheezing. No rales.  Cardiovascular:     Normal rate. Regular rhythm. Normal S1. Normal S2.     Murmurs: There is no murmur.  Edema:    Peripheral edema absent.  Abdominal:     Palpations: Abdomen is soft.  Skin:    General: Skin is warm and dry.  Neurological:     General: No focal deficit present.     Mental Status: Alert and oriented to person, place and time.     Cranial Nerves: Cranial nerves are intact.          ASSESSMENT & PLAN:    1. Essential hypertension Blood pressure is much closer to goal.  I have recommended increasing losartan to 50 mg daily.  We will plan on repeat BMET in 3 to 4 weeks.  2. Paroxysmal atrial fibrillation (HCC) Maintaining sinus rhythm.  He was noted to have atrial fibrillation in the setting of cocaine use when he presented with a stroke.  He was not felt to be a good candidate for anticoagulation at that time.  He has been maintained on amiodarone since.  He does have COPD/emphysema.  Amiodarone is not likely a good long-term drug for him.  I have reviewed his case with Dr. Acie Fredrickson.  We will go ahead and stop his amiodarone now.  If he has recurrent atrial fibrillation off of amiodarone, we will need to reconsider antiarrhythmic drug therapy.  If he has recurrent atrial fibrillation and remains off of cocaine, we can likely reconsider placing him on anticoagulation.  3. Takotsubo cardiomyopathy He had full recovery of ejection fraction.  Continue beta-blocker, ARB.    Dispo:  Return in about 6 months (around 06/06/2021) for Routine Follow Up, w/ Dr. Acie Fredrickson, in person.   Medication Adjustments/Labs and Tests  Ordered: Current medicines are reviewed at length with the patient today.  Concerns regarding medicines are outlined above.  Tests Ordered: Orders Placed This Encounter  Procedures  . Basic metabolic panel  . EKG 12-Lead   Medication Changes: Meds ordered this encounter  Medications  . losartan (COZAAR) 50 MG tablet    Sig: Take 1 tablet (50 mg total) by mouth daily.    Dispense:  90 tablet    Refill:  3    Signed, Richardson Dopp, PA-C  12/04/2020 12:58 PM    [address], [place],   [address] Phone: [phone]; Fax: [phone]

## 2020-12-04 ENCOUNTER — Other Ambulatory Visit: Payer: Self-pay

## 2020-12-04 ENCOUNTER — Ambulatory Visit: Payer: Medicare Other | Admitting: Physician Assistant

## 2020-12-04 ENCOUNTER — Other Ambulatory Visit: Payer: Self-pay | Admitting: Physician Assistant

## 2020-12-04 ENCOUNTER — Encounter: Payer: Self-pay | Admitting: Physician Assistant

## 2020-12-04 VITALS — BP 130/74 | HR 65 | Ht 69.75 in | Wt 148.0 lb

## 2020-12-04 DIAGNOSIS — I480 Paroxysmal atrial fibrillation: Secondary | ICD-10-CM

## 2020-12-04 DIAGNOSIS — I5181 Takotsubo syndrome: Secondary | ICD-10-CM | POA: Diagnosis not present

## 2020-12-04 DIAGNOSIS — I10 Essential (primary) hypertension: Secondary | ICD-10-CM | POA: Diagnosis not present

## 2020-12-04 MED ORDER — LOSARTAN POTASSIUM 50 MG PO TABS: 50.0000 mg | ORAL_TABLET | Freq: Every day | ORAL | 3 refills | Status: DC

## 2020-12-04 NOTE — Patient Instructions (Addendum)
Medication Instructions:  Your physician has recommended you make the following change in your medication:  1.  INCREASE the Losartan to 50 mg taking 1 daily 2.  STOP Amiodarone   *If you need a refill on your cardiac medications before your next appointment, please call your pharmacy*   Lab Work: 3-4 WEEKS:  BMET & TSH  If you have labs (blood work) drawn today and your tests are completely normal, you will receive your results only by: Marland Kitchen MyChart Message (if you have MyChart) OR . A paper copy in the mail If you have any lab test that is abnormal or we need to change your treatment, we will call you to review the results.   Testing/Procedures: None ordered   Follow-Up: At [facility], you and your health needs are our priority.  As part of our continuing mission to provide you with exceptional heart care, we have created designated Provider Care Teams.  These Care Teams include your primary Cardiologist (physician) and Advanced Practice Providers (APPs -  Physician Assistants and Nurse Practitioners) who all work together to provide you with the care you need, when you need it.  We recommend signing up for the patient portal called "MyChart".  Sign up information is provided on this After Visit Summary.  MyChart is used to connect with patients for Virtual Visits (Telemedicine).  Patients are able to view lab/test results, encounter notes, upcoming appointments, etc.  Non-urgent messages can be sent to your provider as well.   To learn more about what you can do with MyChart, go to [email or web address].    Your next appointment:   6 month(s)  The format for your next appointment:   In Person  Provider:   You may see Mertie Moores, MD or one of the following Advanced Practice Providers on your designated Care Team:    Richardson Dopp, PA-C  Robbie Lis, [place]    Other Instructions

## 2020-12-05 ENCOUNTER — Encounter (HOSPITAL_COMMUNITY)
Admission: RE | Admit: 2020-12-05 | Discharge: 2020-12-05 | Disposition: A | Discharge status: Home / Self Care | Payer: Medicare Other | Source: Ambulatory Visit | Attending: Internal Medicine | Admitting: Internal Medicine

## 2020-12-05 DIAGNOSIS — J438 Other emphysema: Secondary | ICD-10-CM | POA: Diagnosis not present

## 2020-12-05 NOTE — Progress Notes (Signed)
Pulmonary Individual Treatment Plan  Patient Details  Name: Travis Salinas MRN: [id number] Date of Birth: July 04, 1951 Referring Provider:   April Manson Pulmonary Rehab Walk Test from 11/21/2020 in [facility]  Referring Provider Dr. Chase Caller      Initial Encounter Date:  Flowsheet Row Pulmonary Rehab Walk Test from 11/21/2020 in [facility]  Date 11/21/20      Visit Diagnosis: No diagnosis found.  Patient's Home Medications on Admission:   Current Outpatient Medications:  .  albuterol (VENTOLIN HFA) 108 (90 Base) MCG/ACT inhaler, Inhale 2 puffs into the lungs every 6 (six) hours as needed for wheezing or shortness of breath., Disp: 6.7 g, Rfl: 0 .  alprazolam (XANAX) 2 MG tablet, Take 1 mg by mouth 3 (three) times daily as needed for sleep or anxiety., Disp: , Rfl:  .  Amantadine HCl 100 MG tablet, Take 100 mg by mouth 2 (two) times daily., Disp: , Rfl:  .  ANORO ELLIPTA 62.5-25 MCG/INH AEPB, INHALE 1 PUFF BY MOUTH INTO THE LUNGS DAILY, Disp: 60 each, Rfl: 3 .  ARIPiprazole (ABILIFY) 10 MG tablet, Take 1 tablet (10 mg total) by mouth daily., Disp:  , Rfl:  .  aspirin EC 81 MG EC tablet, Take 1 tablet (81 mg total) by mouth daily., Disp:  , Rfl:  .  atorvastatin (LIPITOR) 40 MG tablet, Take 1 tablet (40 mg total) by mouth daily., Disp: 90 tablet, Rfl: 3 .  clonazePAM (KLONOPIN) 0.5 MG tablet, Take 0.5 mg by mouth 2 (two) times daily as needed for anxiety., Disp: , Rfl:  .  FLUoxetine (PROZAC) 10 MG tablet, Take 1 tablet (10 mg total) by mouth daily., Disp: 30 tablet, Rfl: 3 .  fluticasone (FLONASE) 50 MCG/ACT nasal spray, Place 2 sprays into both nostrils as needed for allergies or rhinitis. , Disp: , Rfl:  .  ipratropium-albuterol (DUONEB) 0.5-2.5 (3) MG/3ML SOLN, Take 3 mLs by nebulization every 4 (four) hours as needed (sob)., Disp: , Rfl:  .  lamoTRIgine (LAMICTAL) 200 MG tablet, Take 1 tablet (200 mg total) by mouth  every morning., Disp: 30 tablet, Rfl: 0 .  losartan (COZAAR) 50 MG tablet, Take 1 tablet (50 mg total) by mouth daily., Disp: 90 tablet, Rfl: 3 .  metoprolol succinate (TOPROL-XL) 25 MG 24 hr tablet, Take 1 tablet (25 mg total) by mouth daily., Disp: 90 tablet, Rfl: 3 .  Multiple Vitamins-Minerals (MULTIVITAMIN WITH MINERALS) tablet, Take 1 tablet by mouth daily., Disp: , Rfl:  .  oxyCODONE (OXYCONTIN) 20 mg 12 hr tablet, Take 1 tablet (20 mg total) by mouth every 12 (twelve) hours., Disp: 14 tablet, Rfl: 0 .  polyethylene glycol (MIRALAX / GLYCOLAX) 17 g packet, Take 17 g by mouth daily as needed for mild constipation. , Disp: , Rfl:  .  silodosin (RAPAFLO) 8 MG CAPS capsule, Take 1 capsule (8 mg total) by mouth daily with breakfast., Disp: 30 capsule, Rfl: 0 .  topiramate (TOPAMAX) 25 MG tablet, Take 25 mg by mouth daily., Disp: , Rfl:   Past Medical History: Past Medical History:  Diagnosis Date  . Arthritis   . Cervical disc herniation   . COPD (chronic obstructive pulmonary disease) ([place])   . Headache    " due to antibiotics"  . Hepatitis C   . Hypertension     Tobacco Use: Social History   Tobacco Use  Smoking Status Former Smoker  . Packs/day: 1.00  . Years:  32.00  . Pack years: 32.00  . Types: Cigarettes  . Start date: 75  . Quit date: 2002  . Years since quitting: 20.2  Smokeless Tobacco Never Used    Labs: Recent Chemical engineer    Labs for ITP Cardiac and Pulmonary Rehab Latest Ref Rng & Units 05/31/2017 06/14/2017 01/01/2020 01/02/2020 06/23/2020   Cholestrol 0 - 200 mg/dL - - - 151 -   LDLCALC 0 - 99 mg/dL - - - 87 -   HDL >40 mg/dL - - - 45 -   Trlycerides <150 mg/dL - - - 97 -   Hemoglobin A1c 4.8 - 5.6 % - 5.9(H) - 5.2 -   PHART 7.350 - 7.450 7.467(H) - - - 7.323(L)   PCO2ART 32.0 - 48.0 mmHg 35.3 - - - 52.9(H)   HCO3 20.0 - 28.0 mmol/L 25.1 - - - 26.7   TCO2 22 - 32 mmol/L - - 24 - -   ACIDBASEDEF 0.0 - 2.0 mmol/L - - - - -   O2SAT % 98.7 - - -  98.3      Capillary Blood Glucose: Lab Results  Component Value Date   GLUCAP 92 01/03/2020   GLUCAP 120 (H) 01/02/2020     Pulmonary Assessment Scores:  Pulmonary Assessment Scores    Row Name 11/17/20 1308 11/21/20 1602       ADL UCSD   ADL Phase Entry --    SOB Score total 35 --         CAT Score   CAT Score 13 --         mMRC Score   mMRC Score -- 2          UCSD: Self-administered rating of dyspnea associated with activities of daily living (ADLs) 6-point scale (0 = "not at all" to 5 = "maximal or unable to do because of breathlessness")  Scoring Scores range from 0 to 120.  Minimally important difference is 5 units  CAT: CAT can identify the health impairment of COPD patients and is better correlated with disease progression.  CAT has a scoring range of zero to 40. The CAT score is classified into four groups of low (less than 10), medium (10 - 20), high (21-30) and very high (31-40) based on the impact level of disease on health status. A CAT score over 10 suggests significant symptoms.  A worsening CAT score could be explained by an exacerbation, poor medication adherence, poor inhaler technique, or progression of COPD or comorbid conditions.  CAT MCID is 2 points  mMRC: mMRC (Modified Medical Research Council) Dyspnea Scale is used to assess the degree of baseline functional disability in patients of respiratory disease due to dyspnea. No minimal important difference is established. A decrease in score of 1 point or greater is considered a positive change.   Pulmonary Function Assessment:   Exercise Target Goals: Exercise Program Goal: Individual exercise prescription set using results from initial 6 min walk test and THRR while considering  patient's activity barriers and safety.   Exercise Prescription Goal: Initial exercise prescription builds to 30-45 minutes a day of aerobic activity, 2-3 days per week.  Home exercise guidelines will be given to  patient during program as part of exercise prescription that the participant will acknowledge.  Activity Barriers & Risk Stratification:  Activity Barriers & Cardiac Risk Stratification - 11/17/20 1052      Activity Barriers & Cardiac Risk Stratification   Activity Barriers Arthritis;Right Knee Replacement;Neck/Spine Problems;Deconditioning;Shortness of Breath;History of  Falls;Balance Concerns;Assistive Device    Cardiac Risk Stratification High           6 Minute Walk:  6 Minute Walk    Row Name 11/21/20 1557         6 Minute Walk   Phase Initial     Distance 1420 feet     Walk Time 6 minutes     # of Rest Breaks 0     MPH 2.69     METS 3.73     RPE 11     Perceived Dyspnea  1     VO2 Peak 13.06     Symptoms Yes (comment)     Comments Hip pain rate 3/10     Resting HR 67 bpm     Resting BP 140/68     Resting Oxygen Saturation  96 %     Exercise Oxygen Saturation  during 6 min walk 94 %     Max Ex. HR 86 bpm     Max Ex. BP 164/70     2 Minute Post BP 156/78           Interval HR   1 Minute HR 77     2 Minute HR 78     3 Minute HR 81     4 Minute HR 85     5 Minute HR 86     6 Minute HR 85     2 Minute Post HR 68     Interval Heart Rate? Yes           Interval Oxygen   Interval Oxygen? Yes     Baseline Oxygen Saturation % 96 %     1 Minute Oxygen Saturation % 95 %     1 Minute Liters of Oxygen 0 L     2 Minute Oxygen Saturation % 94 %     2 Minute Liters of Oxygen 0 L     3 Minute Oxygen Saturation % 95 %     3 Minute Liters of Oxygen 0 L     4 Minute Oxygen Saturation % 95 %     4 Minute Liters of Oxygen 0 L     5 Minute Oxygen Saturation % 95 %     5 Minute Liters of Oxygen 0 L     6 Minute Oxygen Saturation % 95 %     6 Minute Liters of Oxygen 0 L     2 Minute Post Oxygen Saturation % 98 %     2 Minute Post Liters of Oxygen 0 L            Oxygen Initial Assessment:  Oxygen Initial Assessment - 11/21/20 1602      Home Oxygen   Home Oxygen  Device None    Sleep Oxygen Prescription None    Home Exercise Oxygen Prescription None    Home Resting Oxygen Prescription None      Initial 6 min Walk   Oxygen Used None      Program Oxygen Prescription   Program Oxygen Prescription None      Intervention   Short Term Goals To learn and understand importance of monitoring SPO2 with pulse oximeter and demonstrate accurate use of the pulse oximeter.;To learn and understand importance of maintaining oxygen saturations>88%;To learn and demonstrate proper pursed lip breathing techniques or other breathing techniques.;To learn and demonstrate proper use of respiratory medications    Long  Term Goals Verbalizes importance of monitoring  SPO2 with pulse oximeter and return demonstration;Maintenance of O2 saturations>88%;Exhibits proper breathing techniques, such as pursed lip breathing or other method taught during program session;Compliance with respiratory medication;Demonstrates proper use of MDI's           Oxygen Re-Evaluation:  Oxygen Re-Evaluation    Row Name 12/05/20 1536             Program Oxygen Prescription   Program Oxygen Prescription None               Home Oxygen   Home Oxygen Device None       Sleep Oxygen Prescription None       Home Exercise Oxygen Prescription None       Home Resting Oxygen Prescription None               Goals/Expected Outcomes   Short Term Goals To learn and understand importance of monitoring SPO2 with pulse oximeter and demonstrate accurate use of the pulse oximeter.;To learn and understand importance of maintaining oxygen saturations>88%;To learn and demonstrate proper pursed lip breathing techniques or other breathing techniques.;To learn and demonstrate proper use of respiratory medications       Long  Term Goals Verbalizes importance of monitoring SPO2 with pulse oximeter and return demonstration;Maintenance of O2 saturations>88%;Exhibits proper breathing techniques, such as pursed lip  breathing or other method taught during program session;Compliance with respiratory medication;Demonstrates proper use of MDI's       Goals/Expected Outcomes compliance and understanding of oxygen saturation and pursed lip breathing.              Oxygen Discharge (Final Oxygen Re-Evaluation):  Oxygen Re-Evaluation - 12/05/20 1536      Program Oxygen Prescription   Program Oxygen Prescription None      Home Oxygen   Home Oxygen Device None    Sleep Oxygen Prescription None    Home Exercise Oxygen Prescription None    Home Resting Oxygen Prescription None      Goals/Expected Outcomes   Short Term Goals To learn and understand importance of monitoring SPO2 with pulse oximeter and demonstrate accurate use of the pulse oximeter.;To learn and understand importance of maintaining oxygen saturations>88%;To learn and demonstrate proper pursed lip breathing techniques or other breathing techniques.;To learn and demonstrate proper use of respiratory medications    Long  Term Goals Verbalizes importance of monitoring SPO2 with pulse oximeter and return demonstration;Maintenance of O2 saturations>88%;Exhibits proper breathing techniques, such as pursed lip breathing or other method taught during program session;Compliance with respiratory medication;Demonstrates proper use of MDI's    Goals/Expected Outcomes compliance and understanding of oxygen saturation and pursed lip breathing.           Initial Exercise Prescription:  Initial Exercise Prescription - 11/21/20 1600      Date of Initial Exercise RX and Referring Provider   Date 11/21/20    Referring Provider Dr. Chase Caller    Expected Discharge Date 01/23/21      NuStep   Level 1    SPM 80    Minutes 30      Prescription Details   Frequency (times per week) 2    Duration Progress to 30 minutes of continuous aerobic without signs/symptoms of physical distress      Intensity   THRR 40-80% of Max Heartrate 61-122    Ratings of  Perceived Exertion 11-13    Perceived Dyspnea 0-4      Progression   Progression Continue to progress workloads to maintain intensity without  signs/symptoms of physical distress.      Resistance Training   Training Prescription Yes    Weight orange bands    Reps 10-15           Perform Capillary Blood Glucose checks as needed.  Exercise Prescription Changes:  Exercise Prescription Changes    Row Name 11/28/20 1500             Response to Exercise   Blood Pressure (Admit) 144/80       Blood Pressure (Exercise) 140/60       Blood Pressure (Exit) 152/80       Heart Rate (Admit) 62 bpm       Heart Rate (Exercise) 86 bpm       Heart Rate (Exit) 62 bpm       Oxygen Saturation (Admit) 97 %       Oxygen Saturation (Exercise) 93 %       Oxygen Saturation (Exit) 96 %       Rating of Perceived Exertion (Exercise) 9       Perceived Dyspnea (Exercise) 1       Duration Progress to 30 minutes of  aerobic without signs/symptoms of physical distress       Intensity Other (comment)  40-80% HR max               Progression   Progression Continue to progress workloads to maintain intensity without signs/symptoms of physical distress.               Resistance Training   Training Prescription Yes       Weight orange bands       Reps 10-15       Time 10 Minutes               NuStep   Level 2       SPM 80       Minutes 30       METs 2.4              Exercise Comments:  Exercise Comments    Row Name 11/23/20 1525           Exercise Comments Pt completed first day of exercise and struggled a bit. He stated he was very fatigued and exhausted from having a lot of doctors appointments and not getting much sleep. We originally had hime doing 15 minutes on the Nustep and 15 minutes walking the track with the rolator. He tolerated the Nustep well with no concerns, but when he started walking the track he was very unsteady even with a rollator. He walked 3 laps and we didnt feel  he was safe anymore to walk the track. After doing that tlittle bit of exercise he was even more fatigued and exhausted. We decided it was best for him to do the cool down stretches and be done for the day. We will continue to monitor and progress with pt as he is able.              Exercise Goals and Review:  Exercise Goals    Row Name 11/17/20 1100             Exercise Goals   Increase Physical Activity Yes       Intervention Provide advice, education, support and counseling about physical activity/exercise needs.;Develop an individualized exercise prescription for aerobic and resistive training based on initial evaluation findings, risk stratification, comorbidities and participant's personal goals.  Expected Outcomes Short Term: Attend rehab on a regular basis to increase amount of physical activity.;Long Term: Add in home exercise to make exercise part of routine and to increase amount of physical activity.;Long Term: Exercising regularly at least 3-5 days a week.       Increase Strength and Stamina Yes       Intervention Provide advice, education, support and counseling about physical activity/exercise needs.;Develop an individualized exercise prescription for aerobic and resistive training based on initial evaluation findings, risk stratification, comorbidities and participant's personal goals.       Expected Outcomes Short Term: Increase workloads from initial exercise prescription for resistance, speed, and METs.;Short Term: Perform resistance training exercises routinely during rehab and add in resistance training at home;Long Term: Improve cardiorespiratory fitness, muscular endurance and strength as measured by increased METs and functional capacity (6MWT)       Able to understand and use rate of perceived exertion (RPE) scale Yes       Intervention Provide education and explanation on how to use RPE scale       Expected Outcomes Short Term: Able to use RPE daily in rehab to  express subjective intensity level;Long Term:  Able to use RPE to guide intensity level when exercising independently       Able to understand and use Dyspnea scale Yes       Intervention Provide education and explanation on how to use Dyspnea scale       Expected Outcomes Short Term: Able to use Dyspnea scale daily in rehab to express subjective sense of shortness of breath during exertion;Long Term: Able to use Dyspnea scale to guide intensity level when exercising independently       Knowledge and understanding of Target Heart Rate Range (THRR) Yes       Intervention Provide education and explanation of THRR including how the numbers were predicted and where they are located for reference       Expected Outcomes Short Term: Able to state/look up THRR;Short Term: Able to use daily as guideline for intensity in rehab;Long Term: Able to use THRR to govern intensity when exercising independently       Understanding of Exercise Prescription Yes       Intervention Provide education, explanation, and written materials on patient's individual exercise prescription       Expected Outcomes Short Term: Able to explain program exercise prescription;Long Term: Able to explain home exercise prescription to exercise independently              Exercise Goals Re-Evaluation :  Exercise Goals Re-Evaluation    Row Name 12/05/20 1528             Exercise Goal Re-Evaluation   Exercise Goals Review Increase Physical Activity;Increase Strength and Stamina;Able to understand and use rate of perceived exertion (RPE) scale;Able to understand and use Dyspnea scale;Knowledge and understanding of Target Heart Rate Range (THRR);Understanding of Exercise Prescription       Comments Patient has completed 4 exercise sessions and has tolerated the Nustep fairly well. Pt has been in program before but has had a stroke since last time. He wants to focus on walking but he is very unsteady on the track even with a rolator. Pt  does not want to use a rolator and I am not comfortable with him walking the track right now even with a rolator. We will aim to have him walk the track for a small amount of time with the rolator and see  how it tolerates it. He is currently exercising at 2.5 METS on the Nustep. Will continue to monitor and progress as he is able.       Expected Outcomes Through exercise at rehab and home, the patient will decrease shortness of breath with daily activities and will confident in carrying out an exercise regimn at home. Also, have patient understand his balance issues and limitations and agree to use assistive device when needed.              Discharge Exercise Prescription (Final Exercise Prescription Changes):  Exercise Prescription Changes - 11/28/20 1500      Response to Exercise   Blood Pressure (Admit) 144/80    Blood Pressure (Exercise) 140/60    Blood Pressure (Exit) 152/80    Heart Rate (Admit) 62 bpm    Heart Rate (Exercise) 86 bpm    Heart Rate (Exit) 62 bpm    Oxygen Saturation (Admit) 97 %    Oxygen Saturation (Exercise) 93 %    Oxygen Saturation (Exit) 96 %    Rating of Perceived Exertion (Exercise) 9    Perceived Dyspnea (Exercise) 1    Duration Progress to 30 minutes of  aerobic without signs/symptoms of physical distress    Intensity Other (comment)   40-80% HR max     Progression   Progression Continue to progress workloads to maintain intensity without signs/symptoms of physical distress.      Resistance Training   Training Prescription Yes    Weight orange bands    Reps 10-15    Time 10 Minutes      NuStep   Level 2    SPM 80    Minutes 30    METs 2.4           Nutrition:  Target Goals: Understanding of nutrition guidelines, daily intake of sodium 1500mg , cholesterol 200mg , calories 30% from fat and 7% or less from saturated fats, daily to have 5 or more servings of fruits and vegetables.  Biometrics:  Pre Biometrics - 11/21/20 1557      Pre  Biometrics   Grip Strength 30 kg            Nutrition Therapy Plan and Nutrition Goals:   Nutrition Assessments:  MEDIFICTS Score Key:  ?70 Need to make dietary changes   40-70 Heart Healthy Diet  ? 40 Therapeutic Level Cholesterol Diet   Picture Your Plate Scores:  <04 Unhealthy dietary pattern with much room for improvement.  41-50 Dietary pattern unlikely to meet recommendations for good health and room for improvement.  51-60 More healthful dietary pattern, with some room for improvement.   >60 Healthy dietary pattern, although there may be some specific behaviors that could be improved.    Nutrition Goals Re-Evaluation:   Nutrition Goals Discharge (Final Nutrition Goals Re-Evaluation):   Psychosocial: Target Goals: Acknowledge presence or absence of significant depression and/or stress, maximize coping skills, provide positive support system. Participant is able to verbalize types and ability to use techniques and skills needed for reducing stress and depression.  Initial Review & Psychosocial Screening:  Initial Psych Review & Screening - 11/17/20 1200      Initial Review   Comments Pt complains of feeling bored.  He does not drive and has to depend upon others to get out of the home.  Pt would like to do more but his shortness of breath keeps him from do much physical activity. Pt feel his medications work for him.  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 Setting desired goal for each stressor or current issue identified.;Long Term goal: The participant improves quality of Life and PHQ9 Scores as seen by post scores and/or verbalization of changes;Short Term goal: Identification and review with  participant of any Quality of Life or Depression concerns found by scoring the questionnaire.           Quality of Life Scores:  Scores of 19 and below usually indicate a poorer quality of life in these areas.  A difference of  2-3 points is a clinically meaningful difference.  A difference of 2-3 points in the total score of the Quality of Life Index has been associated with significant improvement in overall quality of life, self-image, physical symptoms, and general health in studies assessing change in quality of life.  PHQ-9: Recent Review Flowsheet Data    Depression screen [facility] 2/9 11/17/2020 11/17/2020 02/03/2020 07/21/2018 04/06/2018   Decreased Interest 0 0 2 1 1    Down, Depressed, Hopeless 1 1 3 1 2    PHQ - 2 Score 1 1 5 2 3    Altered sleeping 3 - 2 0 1   Tired, decreased energy 3 - 1 1 3    Change in appetite 3 - 0 0 0   Feeling bad or failure about yourself  0 - 2 0 1   Trouble concentrating 1 - 0 0 0   Moving slowly or fidgety/restless 3 - 1 1 1    Suicidal thoughts 0 - 0 0 0   PHQ-9 Score 14 - 11 4 9    Difficult doing work/chores - - Somewhat difficult Somewhat difficult Somewhat difficult     Interpretation of Total Score  Total Score Depression Severity:  1-4 = Minimal depression, 5-9 = Mild depression, 10-14 = Moderate depression, 15-19 = Moderately severe depression, 20-27 = Severe depression   Psychosocial Evaluation and Intervention:  Psychosocial Evaluation - 11/17/20 1116      Psychosocial Evaluation & Interventions   Interventions Encouraged to exercise with the program and follow exercise prescription;Stress management education;Relaxation education           Psychosocial Re-Evaluation:  Psychosocial Re-Evaluation    Row Name 12/04/20 1411             Psychosocial Re-Evaluation   Current issues with Current Stress Concerns       Comments [name]'s demeanor is erratic at times.  He does not always understand "the why" behind our program guidelines.  His  gait is very unsteady and we will not let him exercise on the track.       Expected Outcomes For [name] to handle his stress in healthy ways.       Interventions Stress management education;Relaxation education       Continue Psychosocial Services  Follow up required by staff               Initial Review   Source of Stress Concerns Chronic Illness;Transportation;Unable to perform yard/household activities;Unable to participate in former interests or hobbies              Psychosocial Discharge (Final Psychosocial Re-Evaluation):  Psychosocial Re-Evaluation - 12/04/20 1411      Psychosocial Re-Evaluation   Current issues with Current Stress Concerns  Comments [name]'s demeanor is erratic at times.  He does not always understand "the why" behind our program guidelines.  His gait is very unsteady and we will not let him exercise on the track.    Expected Outcomes For [name] to handle his stress in healthy ways.    Interventions Stress management education;Relaxation education    Continue Psychosocial Services  Follow up required by staff      Initial Review   Source of Stress Concerns Chronic Illness;Transportation;Unable to perform yard/household activities;Unable to participate in former interests or hobbies           Education: Education Goals: Education classes will be provided on a weekly basis, covering required topics. Participant will state understanding/return demonstration of topics presented.  Learning Barriers/Preferences:  Learning Barriers/Preferences - 11/17/20 1116      Learning Barriers/Preferences   Learning Barriers Sight;Hearing    Learning Preferences Verbal Instruction;Group Instruction;Individual Instruction           Education Topics: Risk Factor Reduction:  -Group instruction that is supported by a PowerPoint presentation. Instructor discusses the definition of a risk factor, different risk factors for pulmonary disease, and how the heart and lungs work  together.     Nutrition for Pulmonary Patient:  -Group instruction provided by PowerPoint slides, verbal discussion, and written materials to support subject matter. The instructor gives an explanation and review of healthy diet recommendations, which includes a discussion on weight management, recommendations for fruit and vegetable consumption, as well as protein, fluid, caffeine, fiber, sodium, sugar, and alcohol. Tips for eating when patients are short of breath are discussed. Flowsheet Row PULMONARY REHAB OTHER RESPIRATORY from 07/16/2018 in [place]  Date 05/07/18  Educator Rodman Pickle  Instruction Review Code 2- Demonstrated Understanding      Pursed Lip Breathing:  -Group instruction that is supported by demonstration and informational handouts. Instructor discusses the benefits of pursed lip and diaphragmatic breathing and detailed demonstration on how to preform both.     Oxygen Safety:  -Group instruction provided by PowerPoint, verbal discussion, and written material to support subject matter. There is an overview of "What is Oxygen" and "Why do we need it".  Instructor also reviews how to create a safe environment for oxygen use, the importance of using oxygen as prescribed, and the risks of noncompliance. There is a brief discussion on traveling with oxygen and resources the patient may utilize. Flowsheet Row PULMONARY REHAB OTHER RESPIRATORY from 07/16/2018 in [place]  Date 06/11/18  Educator Cloyde Reams  Instruction Review Code 2- Demonstrated Understanding      Oxygen Equipment:  -Group instruction provided by [facility] Staff utilizing handouts, written materials, and equipment demonstrations.   Signs and Symptoms:  -Group instruction provided by written material and verbal discussion to support subject matter. Warning signs and symptoms of infection, stroke, and heart attack are reviewed and when to call the  physician/911 reinforced. Tips for preventing the spread of infection discussed. Flowsheet Row PULMONARY REHAB OTHER RESPIRATORY from 07/16/2018 in [place]  Date 06/04/18  Educator rn  Instruction Review Code 2- Demonstrated Understanding      Advanced Directives:  -Group instruction provided by verbal instruction and written material to support subject matter. Instructor reviews Advanced Directive laws and proper instruction for filling out document.   Pulmonary Video:  -Group video education that reviews the importance of medication and oxygen compliance, exercise, good nutrition, pulmonary hygiene, and pursed lip and  diaphragmatic breathing for the pulmonary patient.   Exercise for the Pulmonary Patient:  -Group instruction that is supported by a PowerPoint presentation. Instructor discusses benefits of exercise, core components of exercise, frequency, duration, and intensity of an exercise routine, importance of utilizing pulse oximetry during exercise, safety while exercising, and options of places to exercise outside of rehab.     Pulmonary Medications:  -Verbally interactive group education provided by instructor with focus on inhaled medications and proper administration. Flowsheet Row PULMONARY REHAB OTHER RESPIRATORY from 07/16/2018 in [place]  Date 05/19/18  Educator pharm  Instruction Review Code 2- Demonstrated Understanding      Anatomy and Physiology of the Respiratory System and Intimacy:  -Group instruction provided by PowerPoint, verbal discussion, and written material to support subject matter. Instructor reviews respiratory cycle and anatomical components of the respiratory system and their functions. Instructor also reviews differences in obstructive and restrictive respiratory diseases with examples of each. Intimacy, Sex, and Sexuality differences are reviewed with a discussion on how  relationships can change when diagnosed with pulmonary disease. Common sexual concerns are reviewed. Flowsheet Row PULMONARY REHAB OTHER RESPIRATORY from 07/16/2018 in [place]  Date 07/16/18  Educator RN  Instruction Review Code 2- Demonstrated Understanding      MD DAY -A group question and answer session with a medical doctor that allows participants to ask questions that relate to their pulmonary disease state. Flowsheet Row PULMONARY REHAB OTHER RESPIRATORY from 07/16/2018 in [place]  Date 04/14/18  Educator Dr. Nelda Marseille  Instruction Review Code 1- Verbalizes Understanding      OTHER EDUCATION -Group or individual verbal, written, or video instructions that support the educational goals of the pulmonary rehab program. [place] OTHER RESPIRATORY from 07/16/2018 in [place]  Date 05/28/18  Educator Cloyde Reams  Instruction Review Code 1- Verbalizes Understanding  [Sedentary Lifestyle]      Holiday Eating Survival Tips:  -Group instruction provided by [employer], verbal discussion, and written materials to support subject matter. The instructor gives patients tips, tricks, and techniques to help them not only survive but enjoy the holidays despite the onslaught of food that accompanies the holidays.   Knowledge Questionnaire Score:  Knowledge Questionnaire Score - 11/17/20 1210      Knowledge Questionnaire Score   Pre Score 15/18           Core Components/Risk Factors/Patient Goals at Admission:  Personal Goals and Risk Factors at Admission - 11/17/20 1116      Core Components/Risk Factors/Patient Goals on Admission    Weight Management Weight Gain;Yes    Expected Outcomes Weight Gain: Understanding of general recommendations for a high calorie, high protein meal plan that promotes weight gain by distributing calorie intake throughout the day with the  consumption for 4-5 meals, snacks, and/or supplements;Short Term: Continue to assess and modify interventions until short term weight is achieved;Long Term: Adherence to nutrition and physical activity/exercise program aimed toward attainment of established weight goal;Understanding recommendations for meals to include 15-35% energy as protein, 25-35% energy from fat, 35-60% energy from carbohydrates, less than 200mg  of dietary cholesterol, 20-35 gm of total fiber daily;Understanding of distribution of calorie intake throughout the day with the consumption of 4-5 meals/snacks    Improve shortness of breath with ADL's Yes    Intervention Provide education, individualized exercise plan and daily activity instruction to help decrease symptoms of SOB with activities of  daily living.    Expected Outcomes Short Term: Improve cardiorespiratory fitness to achieve a reduction of symptoms when performing ADLs;Long Term: Be able to perform more ADLs without symptoms or delay the onset of symptoms    Hypertension Yes    Intervention Provide education on lifestyle modifcations including regular physical activity/exercise, weight management, moderate sodium restriction and increased consumption of fresh fruit, vegetables, and low fat dairy, alcohol moderation, and smoking cessation.;Monitor prescription use compliance.    Expected Outcomes Short Term: Continued assessment and intervention until BP is < 140/71mm HG in hypertensive participants. < 130/52mm HG in hypertensive participants with diabetes, heart failure or chronic kidney disease.;Long Term: Maintenance of blood pressure at goal levels.    Stress Yes    Intervention Offer individual and/or small group education and counseling on adjustment to heart disease, stress management and health-related lifestyle change. Teach and support self-help strategies.           Core Components/Risk Factors/Patient Goals Review:   Goals and Risk Factor Review    Row Name  12/04/20 1415             Core Components/Risk Factors/Patient Goals Review   Personal Goals Review Develop more efficient breathing techniques such as purse lipped breathing and diaphragmatic breathing and practicing self-pacing with activity.;Increase knowledge of respiratory medications and ability to use respiratory devices properly.;Improve shortness of breath with ADL's       Review [name] just started the program, has exercised 3 times in pulmonary rehab and is very weak and deconditioned.  He is exercising @ 1.8 mets on the nustep for 30 minutes.       Expected Outcomes See admission goals.              Core Components/Risk Factors/Patient Goals at Discharge (Final Review):   Goals and Risk Factor Review - 12/04/20 1415      Core Components/Risk Factors/Patient Goals Review   Personal Goals Review Develop more efficient breathing techniques such as purse lipped breathing and diaphragmatic breathing and practicing self-pacing with activity.;Increase knowledge of respiratory medications and ability to use respiratory devices properly.;Improve shortness of breath with ADL's    Review [name] just started the program, has exercised 3 times in pulmonary rehab and is very weak and deconditioned.  He is exercising @ 1.8 mets on the nustep for 30 minutes.    Expected Outcomes See admission goals.           ITP Comments:   Comments:

## 2020-12-05 NOTE — Progress Notes (Signed)
Daily Session Note  Patient Details  Name: Travis Salinas MRN: [id number] Date of Birth: 1950/09/25 Referring Provider:   April Manson Pulmonary Rehab Walk Test from 11/21/2020 in [place]  Referring Provider Dr. Chase Caller      Encounter Date: 12/05/2020  Check In:  Session Check In - 12/05/20 1424      Check-In   Supervising physician immediately available to respond to emergencies [facility] immediately available    Physician(s) Dr. [name]    Location [facility]    Staff Present Rosebud Poles, RN, BSN;Lisa Ysidro Evert, RN;Mayte Diers Hassell Done, MS, ACSM-CEP, Exercise Physiologist    Virtual Visit No    Medication changes reported     No    Fall or balance concerns reported    No    Tobacco Cessation No Change    Warm-up and Cool-down Performed on first and last piece of equipment    Resistance Training Performed Yes    VAD Patient? No    PAD/SET Patient? No      Pain Assessment   Currently in Pain? No/denies    Multiple Pain Sites No           Capillary Blood Glucose: No results found for this or any previous visit (from the past 24 hour(s)).    Social History   Tobacco Use  Smoking Status Former Smoker  . Packs/day: 1.00  . Years: 32.00  . Pack years: 32.00  . Types: Cigarettes  . Start date: 74  . Quit date: 2002  . Years since quitting: 20.2  Smokeless Tobacco Never Used    Goals Met:  Proper associated with RPD/PD & O2 Sat Exercise tolerated well No report of cardiac concerns or symptoms Strength training completed today  Goals Unmet:  Not Applicable  Comments: Service time is from 1400 to 1508    Dr. Fransico Him is Medical Director for Cardiac Rehab at [facility].

## 2020-12-07 ENCOUNTER — Other Ambulatory Visit: Payer: Self-pay

## 2020-12-07 ENCOUNTER — Encounter (HOSPITAL_COMMUNITY)
Admission: RE | Admit: 2020-12-07 | Discharge: 2020-12-07 | Disposition: A | Discharge status: Home / Self Care | Payer: Medicare Other | Source: Ambulatory Visit | Attending: Internal Medicine | Admitting: Internal Medicine

## 2020-12-07 DIAGNOSIS — J438 Other emphysema: Secondary | ICD-10-CM | POA: Diagnosis not present

## 2020-12-07 NOTE — Progress Notes (Signed)
Daily Session Note  Patient Details  Name: Travis Salinas MRN: [id number] Date of Birth: 09/15/1950 Referring Provider:   April Salinas Pulmonary Rehab Walk Test from 11/21/2020 in [place]  Referring Provider Dr. Chase Salinas      Encounter Date: 12/07/2020  Check In:  Session Check In - 12/07/20 1425      Check-In   Supervising physician immediately available to respond to emergencies [facility] immediately available    Physician(s) Dr. Florencia Salinas    [facility]    Staff Present Travis Poles, RN, BSN;Travis Tupy Ysidro Evert, RN;Travis Hassell Done, MS, ACSM-CEP, Exercise Physiologist    Virtual Visit No    Medication changes reported     No    Fall or balance concerns reported    No    Tobacco Cessation No Change    Warm-up and Cool-down Performed on first and last piece of equipment    Resistance Training Performed Yes    VAD Patient? No    PAD/SET Patient? No      Pain Assessment   Currently in Pain? No/denies    Multiple Pain Sites No           Capillary Blood Glucose: No results found for this or any previous visit (from the past 24 hour(s)).    Social History   Tobacco Use  Smoking Status Former Smoker  . Packs/day: 1.00  . Years: 32.00  . Pack years: 32.00  . Types: Cigarettes  . Start date: 44  . Quit date: 2002  . Years since quitting: 20.2  Smokeless Tobacco Never Used    Goals Met:  Exercise tolerated well No report of cardiac concerns or symptoms Strength training completed today  Goals Unmet:  Not Applicable  Comments: Service time is from 1350 to 1500    Dr. Fransico Salinas is Medical Director for Cardiac Rehab at [facility].

## 2020-12-09 ENCOUNTER — Other Ambulatory Visit (HOSPITAL_COMMUNITY): Payer: Self-pay

## 2020-12-09 MED FILL — Losartan Potassium Tab 50 MG: ORAL | 90 days supply | Qty: 90 | Fill #0 | Status: AC

## 2020-12-11 ENCOUNTER — Other Ambulatory Visit (HOSPITAL_COMMUNITY): Payer: Self-pay

## 2020-12-12 ENCOUNTER — Encounter (HOSPITAL_COMMUNITY)
Admission: RE | Admit: 2020-12-12 | Discharge: 2020-12-12 | Disposition: A | Discharge status: Home / Self Care | Payer: Medicare Other | Source: Ambulatory Visit | Attending: Internal Medicine | Admitting: Internal Medicine

## 2020-12-12 ENCOUNTER — Other Ambulatory Visit: Payer: Self-pay

## 2020-12-12 VITALS — Wt 147.7 lb

## 2020-12-12 DIAGNOSIS — J438 Other emphysema: Secondary | ICD-10-CM | POA: Insufficient documentation

## 2020-12-12 NOTE — Progress Notes (Signed)
Travis Salinas 70 y.o. male Nutrition Note  Diagnosis:Pulmonary Emphysema   Past Medical History:  Diagnosis Date  . Arthritis   . Cervical disc herniation   . COPD (chronic obstructive pulmonary disease) ([place])   . Headache    " due to antibiotics"  . Hepatitis C   . Hypertension      Medications reviewed.   Current Outpatient Medications:  .  albuterol (VENTOLIN HFA) 108 (90 Base) MCG/ACT inhaler, Inhale 2 puffs into the lungs every 6 (six) hours as needed for wheezing or shortness of breath., Disp: 6.7 g, Rfl: 0 .  albuterol (VENTOLIN HFA) 108 (90 Base) MCG/ACT inhaler, INHALE 2 PUFFS EVERY 4-6 HOURS, Disp: 8.5 g, Rfl: 2 .  alprazolam (XANAX) 2 MG tablet, Take 1 mg by mouth 3 (three) times daily as needed for sleep or anxiety., Disp: , Rfl:  .  Amantadine HCl 100 MG tablet, Take 100 mg by mouth 2 (two) times daily., Disp: , Rfl:  .  Amantadine HCl 100 MG tablet, TAKE 1/2 TO 1 TABLET BY MOUTH 2 TIMES DAILY, Disp: 180 tablet, Rfl: 1 .  Amantadine HCl 100 MG tablet, TAKE 1/2-1 TABLET BY MOUTH TWO TIMES DAILY, Disp: 180 tablet, Rfl: 1 .  amoxicillin-clavulanate (AUGMENTIN) 875-125 MG tablet, TAKE 1 TABLET BY MOUTH 2 TIMES DAILY, Disp: 28 tablet, Rfl: 0 .  amoxicillin-clavulanate (AUGMENTIN) 875-125 MG tablet, TAKE 1 TABLET BY MOUTH TWICE DAILY, Disp: 28 tablet, Rfl: 0 .  ARIPiprazole (ABILIFY) 10 MG tablet, Take 1 tablet (10 mg total) by mouth daily., Disp:  , Rfl:  .  ARIPiprazole (ABILIFY) 10 MG tablet, TAKE 1 TABLET BY MOUTH EVERY MORNING, Disp: 90 tablet, Rfl: 1 .  ARIPiprazole (ABILIFY) 10 MG tablet, TAKE 1 TABLET BY MOUTH EVERY MORNING, Disp: 90 tablet, Rfl: 1 .  aspirin EC 81 MG EC tablet, Take 1 tablet (81 mg total) by mouth daily., Disp:  , Rfl:  .  atorvastatin (LIPITOR) 40 MG tablet, TAKE 1 TABLET BY MOUTH ONCE DAILY, Disp: 90 tablet, Rfl: 3 .  clonazePAM (KLONOPIN) 0.5 MG tablet, Take 0.5 mg by mouth 2 (two) times daily as needed for anxiety., Disp: , Rfl:  .   clonazePAM (KLONOPIN) 0.5 MG tablet, TAKE 1 TABLET BY MOUTH 2 TIMES DAILY AS NEEDED, Disp: 180 tablet, Rfl: 1 .  COVID-19 mRNA vaccine, Pfizer, 30 MCG/0.3ML injection, AS DIRECTED, Disp: .3 mL, Rfl: 0 .  doxycycline (VIBRAMYCIN) 100 MG capsule, TAKE 1 CAPSULE BY MOUTH 2 TIMES DAILY FOR 10 DAYS, Disp: 20 capsule, Rfl: 0 .  FLUoxetine (PROZAC) 10 MG capsule, TAKE 1 CAPSULE BY MOUTH EVERY MORNING, Disp: 90 capsule, Rfl: 1 .  FLUoxetine (PROZAC) 10 MG capsule, TAKE 1 CAPSULE BY MOUTH ONCE DAILY IN THE MORNING, Disp: 90 capsule, Rfl: 1 .  FLUoxetine (PROZAC) 10 MG tablet, Take 1 tablet (10 mg total) by mouth daily., Disp: 30 tablet, Rfl: 3 .  fluticasone (FLONASE) 50 MCG/ACT nasal spray, Place 2 sprays into both nostrils as needed for allergies or rhinitis. , Disp: , Rfl:  .  fluticasone (FLONASE) 50 MCG/ACT nasal spray, USE 1 SPRAY IN EACH NOSTRIL ONCE DAILY, Disp: 16 g, Rfl: 5 .  ipratropium-albuterol (DUONEB) 0.5-2.5 (3) MG/3ML SOLN, Take 3 mLs by nebulization every 4 (four) hours as needed (sob)., Disp: , Rfl:  .  lamoTRIgine (LAMICTAL) 200 MG tablet, Take 1 tablet (200 mg total) by mouth every morning., Disp: 30 tablet, Rfl: 0 .  lamoTRIgine (LAMICTAL) 200 MG tablet, TAKE 1  TABLET BY MOUTH EVERY MORNING, Disp: 90 tablet, Rfl: 3 .  lamoTRIgine (LAMICTAL) 200 MG tablet, TAKE 1 TABLET BY MOUTH ONCE DAILY EVERY MORNING, Disp: 90 tablet, Rfl: 3 .  losartan (COZAAR) 50 MG tablet, TAKE 1 TABLET (50 MG TOTAL) BY MOUTH DAILY., Disp: 90 tablet, Rfl: 3 .  metoprolol succinate (TOPROL-XL) 25 MG 24 hr tablet, TAKE 1 TABLET BY MOUTH ONCE A DAY, Disp: 90 tablet, Rfl: 3 .  Multiple Vitamins-Minerals (MULTIVITAMIN WITH MINERALS) tablet, Take 1 tablet by mouth daily., Disp: , Rfl:  .  oxyCODONE (OXYCONTIN) 20 mg 12 hr tablet, Take 1 tablet (20 mg total) by mouth every 12 (twelve) hours., Disp: 14 tablet, Rfl: 0 .  Oxycodone HCl 20 MG TABS, TAKE 1 TABLET BY MOUTH 3 TIMES DAILY AS NEEDED FOR PAIN * FOLLOW UP WITH DR  [facility] IN JULY 2022, Disp: 90 tablet, Rfl: 0 .  Oxycodone HCl 20 MG TABS, TAKE 1 TABLET BY MOUTH 3 TIMES DAILY AS NEEDED FOR PAIN, Disp: 90 tablet, Rfl: 0 .  Oxycodone HCl 20 MG TABS, TAKE 1 TABLET BY MOUTH 3 TIMES DAILY AS NEEDED FOR PAIN, Disp: 69 tablet, Rfl: 0 .  Oxycodone HCl 20 MG TABS, TAKE 1 TABLET BY MOUTH 3 TIMES DAILY AS NEEDED FOR PAIN, Disp: 90 tablet, Rfl: 0 .  Oxycodone HCl 20 MG TABS, TAKE 1 TABLET BY MOUTH 3 TIMES DAILY AS NEEDED FOR PAIN, Disp: 90 tablet, Rfl: 0 .  Oxycodone HCl 20 MG TABS, TAKE 1 TABLET BY MOUTH THREE TIMES DAILY AS NEEDED FOR PAIN, Disp: 90 tablet, Rfl: 0 .  Oxycodone HCl 20 MG TABS, TAKE 1 TABLET BY MOUTH THREE TIMES A DAY AS NEEDED FOR PAIN, Disp: 90 tablet, Rfl: 0 .  polyethylene glycol (MIRALAX / GLYCOLAX) 17 g packet, Take 17 g by mouth daily as needed for mild constipation. , Disp: , Rfl:  .  predniSONE (STERAPRED UNI-PAK 21 TAB) 10 MG (21) TBPK tablet, TAKE 6 TABLETS BY MOUTH ON DAY 1 THEN DECREASE BY 1 TABLET DAILY UNTIL FINISHED (6-5-4-3-2-1), Disp: 21 each, Rfl: 0 .  predniSONE (STERAPRED UNI-PAK 48 TAB) 10 MG (48) TBPK tablet, TAKE BY MOUTH AS DIRECTED ON PACKAGE, Disp: 48 each, Rfl: 0 .  silodosin (RAPAFLO) 8 MG CAPS capsule, Take 1 capsule (8 mg total) by mouth daily with breakfast., Disp: 30 capsule, Rfl: 0 .  silodosin (RAPAFLO) 8 MG CAPS capsule, TAKE 1 CAPSULE BY MOUTH DAILY, Disp: 90 capsule, Rfl: 3 .  tamsulosin (FLOMAX) 0.4 MG CAPS capsule, TAKE 1 CAPSULE BY MOUTH AT BEDTIME, Disp: 90 capsule, Rfl: 3 .  topiramate (TOPAMAX) 25 MG tablet, Take 25 mg by mouth daily., Disp: , Rfl:  .  topiramate (TOPAMAX) 25 MG tablet, TAKE 1 TABLET BY MOUTH ONCE DAILY, Disp: 90 tablet, Rfl: 3 .  umeclidinium-vilanterol (ANORO ELLIPTA) 62.5-25 MCG/INH AEPB, INHALE 1 PUFF BY MOUTH INTO THE LUNGS DAILY, Disp: 60 each, Rfl: 3   Ht Readings from Last 1 Encounters:  12/04/20 5' 9.75" (1.772 m)     Wt Readings from Last 3 Encounters:  12/04/20 148 lb (67.1 kg)   11/28/20 148 lb 2.4 oz (67.2 kg)  11/17/20 147 lb 0.8 oz (66.7 kg)     There is no height or weight on file to calculate BMI.   Social History   Tobacco Use  Smoking Status Former Smoker  . Packs/day: 1.00  . Years: 32.00  . Pack years: 32.00  . Types: Cigarettes  . Start date: 58  .  Quit date: 2002  . Years since quitting: 20.2  Smokeless Tobacco Never Used      Nutrition Note  Spoke with pt. Nutrition Plan and Nutrition Survey goals reviewed with pt.   No Weight loss. Appetite is good. He eats 5 meals per day and snacks throughout.  His ex-wife helps with grocery shopping. No difficulty cooking.  He reads some labels. He tries to reduce sodium, avoid fried foods, and decrease processed foods. Diet recall reveals high intake of highly processed foods.  Per discussion, pt does use canned/convenience foods often. Pt does eat high sodium foods often. Pt does not eat out frequently.  Reviewed sodium recommendations and label reading tips.   Diet recall: Breakfast: cheese omelete  Lunch: sandwich Dinner: hamburger, tater tots, and fries Snack: chicken patty on bun, 2 hot dogs. Mini pies with ice cream.  Pt expressed understanding of the information reviewed.   Nutrition Diagnosis ? Excessive sodium intake related to over consumption of processed food as evidenced by frequent consumption of convenience food/ canned vegetables   Nutrition Intervention ? Pt's individual nutrition plan reviewed with pt. ? Low sodium diet, label reading   ? Pt given handouts for: ? Label reading ? Continue client-centered nutrition education by RD, as part of interdisciplinary care.  Goal(s) ? Pt to build a healthy plate including vegetables, fruits, whole grains, and low-fat dairy products in a heart healthy meal plan. ? Pt to watch out for sweets and added sugar ? Pt to read labels to reduce sugar and sodium  Plan:   Will provide client-centered nutrition education as part of  interdisciplinary care  Monitor and evaluate progress toward nutrition goal with team.   Michaele Offer, MS, RDN, LDN

## 2020-12-13 ENCOUNTER — Other Ambulatory Visit (HOSPITAL_COMMUNITY): Payer: Self-pay

## 2020-12-13 DIAGNOSIS — J343 Hypertrophy of nasal turbinates: Secondary | ICD-10-CM | POA: Diagnosis not present

## 2020-12-13 DIAGNOSIS — J342 Deviated nasal septum: Secondary | ICD-10-CM | POA: Diagnosis not present

## 2020-12-13 DIAGNOSIS — J310 Chronic rhinitis: Secondary | ICD-10-CM | POA: Diagnosis not present

## 2020-12-13 MED ORDER — PREDNISONE 10 MG (21) PO TBPK
ORAL_TABLET | ORAL | 0 refills | Status: DC
  Filled 2020-12-13: qty 21, 6d supply, fill #0

## 2020-12-14 ENCOUNTER — Other Ambulatory Visit: Payer: Self-pay

## 2020-12-14 ENCOUNTER — Encounter (HOSPITAL_COMMUNITY)
Admission: RE | Admit: 2020-12-14 | Discharge: 2020-12-14 | Disposition: A | Discharge status: Home / Self Care | Payer: Medicare Other | Source: Ambulatory Visit | Attending: Internal Medicine | Admitting: Internal Medicine

## 2020-12-14 DIAGNOSIS — J438 Other emphysema: Secondary | ICD-10-CM

## 2020-12-14 NOTE — Progress Notes (Signed)
Travis Salinas arrived to pulmonary rehab today stating he had "a bad migraine headache last night" and did not sleep well at all.  He said the headache had resolved, but his BP was 184/100 and rechecked 10 minutes later, and it was 182/100.  He states he took all his normal medications today.  His ex-wife brought him to class and I discussed with Travis Salinas and her to check his BP at home tonight and in the morning, if it continues to be elevated to call his PCP tomorrow.  Travis Salinas and his ex-wife voiced understanding of the instructions.  Travis Salinas was in no acute distress and did not know his BP was elevated.Travis Salinas was not allowed to exercise today.

## 2020-12-18 ENCOUNTER — Other Ambulatory Visit (HOSPITAL_COMMUNITY): Payer: Self-pay

## 2020-12-18 MED ORDER — OXYCODONE HCL 20 MG PO TABS
ORAL_TABLET | ORAL | 0 refills | Status: DC
  Filled 2020-12-18: qty 90, 30d supply, fill #0

## 2020-12-19 ENCOUNTER — Other Ambulatory Visit (HOSPITAL_COMMUNITY): Payer: Self-pay

## 2020-12-19 ENCOUNTER — Ambulatory Visit: Payer: PPO | Admitting: Adult Health

## 2020-12-19 ENCOUNTER — Encounter (HOSPITAL_COMMUNITY)
Admission: RE | Admit: 2020-12-19 | Discharge: 2020-12-19 | Disposition: A | Discharge status: Home / Self Care | Payer: Medicare Other | Source: Ambulatory Visit | Attending: Internal Medicine | Admitting: Internal Medicine

## 2020-12-19 ENCOUNTER — Other Ambulatory Visit: Payer: Self-pay

## 2020-12-19 DIAGNOSIS — J438 Other emphysema: Secondary | ICD-10-CM

## 2020-12-19 NOTE — Progress Notes (Signed)
Daily Session Note  Patient Details  Name: Travis Salinas MRN: [id number] Date of Birth: February 20, 1951 Referring Provider:   April Manson Pulmonary Rehab Walk Test from 11/21/2020 in [place]  Referring Provider Dr. Chase Caller      Encounter Date: 12/19/2020  Check In:  Session Check In - 12/19/20 1436      Check-In   Supervising physician immediately available to respond to emergencies [facility] immediately available    Physician(s) Dr. [name]    [facility]    Staff Present Rosebud Poles, RN, Milus Glazier, MS, EP-C, CCRP;Joseph Bias Hassell Done, MS, ACSM-CEP, Exercise Physiologist    Virtual Visit Yes    Medication changes reported     No    Fall or balance concerns reported    No    Tobacco Cessation No Change    Warm-up and Cool-down Performed on first and last piece of equipment    Resistance Training Performed Yes    VAD Patient? No    PAD/SET Patient? No      Pain Assessment   Currently in Pain? No/denies    Multiple Pain Sites No           Capillary Blood Glucose: No results found for this or any previous visit (from the past 24 hour(s)).    Social History   Tobacco Use  Smoking Status Former Smoker  . Packs/day: 1.00  . Years: 32.00  . Pack years: 32.00  . Types: Cigarettes  . Start date: 63  . Quit date: 2002  . Years since quitting: 20.2  Smokeless Tobacco Never Used    Goals Met:  Proper associated with RPD/PD & O2 Sat Exercise tolerated well No report of cardiac concerns or symptoms Strength training completed today  Goals Unmet:  Not Applicable  Comments: Service time is from 1330 to 1433    Dr. Fransico Him is Medical Director for Cardiac Rehab at [facility].

## 2020-12-21 ENCOUNTER — Other Ambulatory Visit: Payer: Self-pay

## 2020-12-21 ENCOUNTER — Encounter (HOSPITAL_COMMUNITY)
Admission: RE | Admit: 2020-12-21 | Discharge: 2020-12-21 | Disposition: A | Discharge status: Home / Self Care | Payer: Medicare Other | Source: Ambulatory Visit | Attending: Internal Medicine | Admitting: Internal Medicine

## 2020-12-21 DIAGNOSIS — J438 Other emphysema: Secondary | ICD-10-CM

## 2020-12-21 NOTE — Progress Notes (Signed)
Daily Session Note  Patient Details  Name: Travis Salinas MRN: [id number] Date of Birth: Aug 16, 1951 Referring Provider:   April Manson Pulmonary Rehab Walk Test from 11/21/2020 in [place]  Referring Provider Dr. Chase Caller      Encounter Date: 12/21/2020  Check In:  Session Check In - 12/21/20 1440      Check-In   Supervising physician immediately available to respond to emergencies [facility] immediately available    Physician(s) Dr. Florencia Reasons    Location [facility]    Staff Present Rosebud Poles, RN, BSN;Lisa Ysidro Evert, RN;Darcell Sabino Hassell Done, MS, ACSM-CEP, Exercise Physiologist    Virtual Visit No    Medication changes reported     No    Fall or balance concerns reported    No    Tobacco Cessation No Change    Warm-up and Cool-down Performed on first and last piece of equipment    Resistance Training Performed Yes    VAD Patient? No    PAD/SET Patient? No      Pain Assessment   Currently in Pain? No/denies    Multiple Pain Sites No           Capillary Blood Glucose: No results found for this or any previous visit (from the past 24 hour(s)).    Social History   Tobacco Use  Smoking Status Former Smoker  . Packs/day: 1.00  . Years: 32.00  . Pack years: 32.00  . Types: Cigarettes  . Start date: 43  . Quit date: 2002  . Years since quitting: 20.2  Smokeless Tobacco Never Used    Goals Met:  Proper associated with RPD/PD & O2 Sat Exercise tolerated well No report of cardiac concerns or symptoms Strength training completed today  Goals Unmet:  Not Applicable  Comments: Service time is from 1315 to 1430    Dr. Fransico Him is Medical Director for Cardiac Rehab at [facility].

## 2020-12-26 ENCOUNTER — Other Ambulatory Visit: Payer: Self-pay

## 2020-12-26 ENCOUNTER — Encounter (HOSPITAL_COMMUNITY)
Admission: RE | Admit: 2020-12-26 | Discharge: 2020-12-26 | Disposition: A | Discharge status: Home / Self Care | Payer: Medicare Other | Source: Ambulatory Visit | Attending: Internal Medicine | Admitting: Internal Medicine

## 2020-12-26 VITALS — Wt 146.2 lb

## 2020-12-26 DIAGNOSIS — J438 Other emphysema: Secondary | ICD-10-CM | POA: Diagnosis not present

## 2020-12-26 MED FILL — Umeclidinium-Vilanterol Aero Powd BA 62.5-25 MCG/ACT: RESPIRATORY_TRACT | 30 days supply | Qty: 60 | Fill #0 | Status: AC

## 2020-12-26 NOTE — Progress Notes (Signed)
Daily Session Note  Patient Details  Name: Travis Salinas MRN: [id number] Date of Birth: 1951-08-27 Referring Provider:   April Manson Pulmonary Rehab Walk Test from 11/21/2020 in [place]  Referring Provider Dr. Chase Caller      Encounter Date: 12/26/2020  Check In:  Session Check In - 12/26/20 1452      Check-In   Supervising physician immediately available to respond to emergencies [facility] immediately available    Physician(s) Dr. [name]    Location [facility]    Staff Present Rosebud Poles, RN, BSN;Lisa Ysidro Evert, RN;Jessica Hassell Done, MS, ACSM-CEP, Exercise Physiologist    Virtual Visit No    Medication changes reported     No    Fall or balance concerns reported    No    Tobacco Cessation No Change    Warm-up and Cool-down Performed on first and last piece of equipment    Resistance Training Performed Yes    VAD Patient? No    PAD/SET Patient? No      Pain Assessment   Currently in Pain? No/denies    Multiple Pain Sites No           Capillary Blood Glucose: No results found for this or any previous visit (from the past 24 hour(s)).   Exercise Prescription Changes - 12/26/20 1500      Response to Exercise   Blood Pressure (Admit) 140/80    Blood Pressure (Exercise) 120/70    Blood Pressure (Exit) 150/82    Heart Rate (Admit) 74 bpm    Heart Rate (Exercise) 92 bpm    Heart Rate (Exit) 95 bpm    Oxygen Saturation (Admit) 97 %    Oxygen Saturation (Exercise) 93 %    Oxygen Saturation (Exit) 95 %    Rating of Perceived Exertion (Exercise) 9    Perceived Dyspnea (Exercise) 1    Duration Continue with 30 min of aerobic exercise without signs/symptoms of physical distress.    Intensity THRR unchanged      Progression   Progression Continue to progress workloads to maintain intensity without signs/symptoms of physical distress.      Resistance Training   Training Prescription Yes    Weight orange  bands    Reps 10-15    Time 10 Minutes      Recumbant Bike   Level 1.5    Watts 2.4    Minutes 15      NuStep   Level 3   the previous exercise change was wrong, should have been levle 3.   SPM 80    Minutes 15    METs 3           Social History   Tobacco Use  Smoking Status Former Smoker  . Packs/day: 1.00  . Years: 32.00  . Pack years: 32.00  . Types: Cigarettes  . Start date: 41  . Quit date: 2002  . Years since quitting: 20.3  Smokeless Tobacco Never Used    Goals Met:  Proper associated with RPD/PD & O2 Sat Exercise tolerated well Strength training completed today  Goals Unmet:  Not Applicable  Comments: Service time is from 1400 to 1515.    Dr. Fransico Him is Medical Director for Cardiac Rehab at [facility].

## 2020-12-27 ENCOUNTER — Other Ambulatory Visit (HOSPITAL_COMMUNITY): Payer: Self-pay

## 2020-12-28 ENCOUNTER — Other Ambulatory Visit: Payer: Self-pay

## 2020-12-28 ENCOUNTER — Encounter (HOSPITAL_COMMUNITY)
Admission: RE | Admit: 2020-12-28 | Discharge: 2020-12-28 | Disposition: A | Discharge status: Home / Self Care | Payer: Medicare Other | Source: Ambulatory Visit | Attending: Internal Medicine | Admitting: Internal Medicine

## 2020-12-28 DIAGNOSIS — J438 Other emphysema: Secondary | ICD-10-CM

## 2020-12-28 NOTE — Progress Notes (Signed)
Daily Session Note  Patient Details  Name: Travis Salinas MRN: [id number] Date of Birth: 06/02/1951 Referring Provider:   Flowsheet Row Pulmonary Rehab Walk Test from 11/21/2020 in [facility]  Referring Provider Dr. Ramaswamy      Encounter Date: 12/28/2020  Check In:  Session Check In - 12/28/20 1448      Check-In   Supervising physician immediately available to respond to emergencies [facility] immediately available    Physician(s) Dr. Adhikari    Location [facility]    Staff Present  , RN, BSN;Lisa Hughes, RN;Jessica Martin, MS, ACSM-CEP, Exercise Physiologist    Virtual Visit No    Medication changes reported     No    Fall or balance concerns reported    No    Tobacco Cessation No Change    Warm-up and Cool-down Performed on first and last piece of equipment    Resistance Training Performed Yes    VAD Patient? No    PAD/SET Patient? No      Pain Assessment   Currently in Pain? No/denies    Multiple Pain Sites No           Capillary Blood Glucose: No results found for this or any previous visit (from the past 24 hour(s)).    Social History   Tobacco Use  Smoking Status Former Smoker  . Packs/day: 1.00  . Years: 32.00  . Pack years: 32.00  . Types: Cigarettes  . Start date: 1968  . Quit date: 2002  . Years since quitting: 20.3  Smokeless Tobacco Never Used    Goals Met:  Proper associated with RPD/PD & O2 Sat Exercise tolerated well Strength training completed today  Goals Unmet:  Not Applicable  Comments: Service time is from 1400 to 1503    Dr. Traci Turner is Medical Director for Cardiac Rehab at [facility]. 

## 2021-01-02 ENCOUNTER — Ambulatory Visit: Payer: Medicare Other | Attending: Internal Medicine

## 2021-01-02 ENCOUNTER — Encounter (HOSPITAL_COMMUNITY)
Admission: RE | Admit: 2021-01-02 | Discharge: 2021-01-02 | Disposition: A | Discharge status: Home / Self Care | Payer: Medicare Other | Source: Ambulatory Visit | Attending: Internal Medicine | Admitting: Internal Medicine

## 2021-01-02 ENCOUNTER — Other Ambulatory Visit: Payer: Medicare Other | Admitting: *Deleted

## 2021-01-02 ENCOUNTER — Other Ambulatory Visit (HOSPITAL_BASED_OUTPATIENT_CLINIC_OR_DEPARTMENT_OTHER): Payer: Self-pay

## 2021-01-02 ENCOUNTER — Other Ambulatory Visit: Payer: Self-pay

## 2021-01-02 DIAGNOSIS — I480 Paroxysmal atrial fibrillation: Secondary | ICD-10-CM | POA: Diagnosis not present

## 2021-01-02 DIAGNOSIS — J438 Other emphysema: Secondary | ICD-10-CM | POA: Diagnosis not present

## 2021-01-02 DIAGNOSIS — I10 Essential (primary) hypertension: Secondary | ICD-10-CM | POA: Diagnosis not present

## 2021-01-02 DIAGNOSIS — I5181 Takotsubo syndrome: Secondary | ICD-10-CM | POA: Diagnosis not present

## 2021-01-02 DIAGNOSIS — Z23 Encounter for immunization: Secondary | ICD-10-CM

## 2021-01-02 MED ORDER — PFIZER-BIONT COVID-19 VAC-TRIS 30 MCG/0.3ML IM SUSP
INTRAMUSCULAR | 0 refills | Status: DC
  Filled 2021-01-02: qty 0.3, 1d supply, fill #0

## 2021-01-02 NOTE — Progress Notes (Signed)
   Covid-19 Vaccination Clinic  Name:  Shea Kapur    MRN: [id number] DOB: 1951/08/12  01/02/2021  Mr. St [name] was observed post Covid-19 immunization for 15 minutes without incident. He was provided with Vaccine Information Sheet and instruction to access the V-Safe system.   Mr. Vinton Layson was instructed to call 911 with any severe reactions post vaccine: Marland Kitchen Difficulty breathing  . Swelling of face and throat  . A fast heartbeat  . A bad rash all over body  . Dizziness and weakness   Immunizations Administered    Name Date Dose VIS Date Route   PFIZER Comrnaty(Gray TOP) Covid-19 Vaccine 01/02/2021 10:31 AM 0.3 mL 08/17/2020 Intramuscular   Manufacturer: [employer], [employer]   Lot: [id number]   NDC: [phone]

## 2021-01-02 NOTE — Progress Notes (Signed)
Daily Session Note  Patient Details  Name: Travis Salinas MRN: [id number] Date of Birth: 1951-06-03 Referring Provider:   April Manson Pulmonary Rehab Walk Test from 11/21/2020 in [place]  Referring Provider Dr. Chase Caller      Encounter Date: 01/02/2021  Check In:  Session Check In - 01/02/21 1441      Check-In   Supervising physician immediately available to respond to emergencies [facility] immediately available    Physician(s) Dr. [name]    Location [facility]    Staff Present Rosebud Poles, RN, BSN;Lisa Ysidro Evert, RN;Danira Nylander Hassell Done, MS, ACSM-CEP, Exercise Physiologist    Virtual Visit No    Medication changes reported     No    Fall or balance concerns reported    No    Tobacco Cessation No Change    Warm-up and Cool-down Performed on first and last piece of equipment    Resistance Training Performed Yes    VAD Patient? No    PAD/SET Patient? No      Pain Assessment   Currently in Pain? No/denies    Multiple Pain Sites No           Capillary Blood Glucose: No results found for this or any previous visit (from the past 24 hour(s)).    Social History   Tobacco Use  Smoking Status Former Smoker  . Packs/day: 1.00  . Years: 32.00  . Pack years: 32.00  . Types: Cigarettes  . Start date: 61  . Quit date: 2002  . Years since quitting: 20.3  Smokeless Tobacco Never Used    Goals Met:  Proper associated with RPD/PD & O2 Sat Exercise tolerated well No report of cardiac concerns or symptoms Strength training completed today  Goals Unmet:  Not Applicable  Comments: Service time is from 1407 to 1515    Dr. Fransico Him is Medical Director for Cardiac Rehab at [facility].

## 2021-01-02 NOTE — Progress Notes (Signed)
Pulmonary Individual Treatment Plan  Patient Details  Name: Travis Salinas MRN: [id number] Date of Birth: [birth year] Referring Provider:   April Manson Pulmonary Rehab Walk Test from 11/21/2020 in [place]  Referring Provider Dr. Chase Caller      Initial Encounter Date:  Flowsheet Row Pulmonary Rehab Walk Test from 11/21/2020 in [place]  Date 11/21/20      Visit Diagnosis: Other emphysema ([place])  Patient's Home Medications on Admission:   Current Outpatient Medications:  .  albuterol (VENTOLIN HFA) 108 (90 Base) MCG/ACT inhaler, Inhale 2 puffs into the lungs every 6 (six) hours as needed for wheezing or shortness of breath., Disp: 6.7 g, Rfl: 0 .  albuterol (VENTOLIN HFA) 108 (90 Base) MCG/ACT inhaler, INHALE 2 PUFFS EVERY 4-6 HOURS, Disp: 8.5 g, Rfl: 2 .  alprazolam (XANAX) 2 MG tablet, Take 1 mg by mouth 3 (three) times daily as needed for sleep or anxiety., Disp: , Rfl:  .  Amantadine HCl 100 MG tablet, Take 100 mg by mouth 2 (two) times daily., Disp: , Rfl:  .  Amantadine HCl 100 MG tablet, TAKE 1/2 TO 1 TABLET BY MOUTH 2 TIMES DAILY, Disp: 180 tablet, Rfl: 1 .  Amantadine HCl 100 MG tablet, TAKE 1/2-1 TABLET BY MOUTH TWO TIMES DAILY, Disp: 180 tablet, Rfl: 1 .  amoxicillin-clavulanate (AUGMENTIN) 875-125 MG tablet, TAKE 1 TABLET BY MOUTH 2 TIMES DAILY, Disp: 28 tablet, Rfl: 0 .  amoxicillin-clavulanate (AUGMENTIN) 875-125 MG tablet, TAKE 1 TABLET BY MOUTH TWICE DAILY, Disp: 28 tablet, Rfl: 0 .  ARIPiprazole (ABILIFY) 10 MG tablet, Take 1 tablet (10 mg total) by mouth daily., Disp:  , Rfl:  .  ARIPiprazole (ABILIFY) 10 MG tablet, TAKE 1 TABLET BY MOUTH EVERY MORNING, Disp: 90 tablet, Rfl: 1 .  ARIPiprazole (ABILIFY) 10 MG tablet, TAKE 1 TABLET BY MOUTH EVERY MORNING, Disp: 90 tablet, Rfl: 1 .  aspirin EC 81 MG EC tablet, Take 1 tablet (81 mg total) by mouth daily., Disp:  , Rfl:  .  atorvastatin (LIPITOR) 40 MG  tablet, TAKE 1 TABLET BY MOUTH ONCE DAILY, Disp: 90 tablet, Rfl: 3 .  clonazePAM (KLONOPIN) 0.5 MG tablet, Take 0.5 mg by mouth 2 (two) times daily as needed for anxiety., Disp: , Rfl:  .  clonazePAM (KLONOPIN) 0.5 MG tablet, TAKE 1 TABLET BY MOUTH 2 TIMES DAILY AS NEEDED, Disp: 180 tablet, Rfl: 1 .  COVID-19 mRNA vaccine, Pfizer, 30 MCG/0.3ML injection, AS DIRECTED, Disp: .3 mL, Rfl: 0 .  doxycycline (VIBRAMYCIN) 100 MG capsule, TAKE 1 CAPSULE BY MOUTH 2 TIMES DAILY FOR 10 DAYS, Disp: 20 capsule, Rfl: 0 .  FLUoxetine (PROZAC) 10 MG capsule, TAKE 1 CAPSULE BY MOUTH EVERY MORNING, Disp: 90 capsule, Rfl: 1 .  FLUoxetine (PROZAC) 10 MG capsule, TAKE 1 CAPSULE BY MOUTH ONCE DAILY IN THE MORNING, Disp: 90 capsule, Rfl: 1 .  FLUoxetine (PROZAC) 10 MG tablet, Take 1 tablet (10 mg total) by mouth daily., Disp: 30 tablet, Rfl: 3 .  fluticasone (FLONASE) 50 MCG/ACT nasal spray, Place 2 sprays into both nostrils as needed for allergies or rhinitis. , Disp: , Rfl:  .  fluticasone (FLONASE) 50 MCG/ACT nasal spray, USE 1 SPRAY IN EACH NOSTRIL ONCE DAILY, Disp: 16 g, Rfl: 5 .  ipratropium-albuterol (DUONEB) 0.5-2.5 (3) MG/3ML SOLN, Take 3 mLs by nebulization every 4 (four) hours as needed (sob)., Disp: , Rfl:  .  lamoTRIgine (LAMICTAL) 200 MG tablet, Take 1  tablet (200 mg total) by mouth every morning., Disp: 30 tablet, Rfl: 0 .  lamoTRIgine (LAMICTAL) 200 MG tablet, TAKE 1 TABLET BY MOUTH EVERY MORNING, Disp: 90 tablet, Rfl: 3 .  lamoTRIgine (LAMICTAL) 200 MG tablet, TAKE 1 TABLET BY MOUTH ONCE DAILY EVERY MORNING, Disp: 90 tablet, Rfl: 3 .  losartan (COZAAR) 50 MG tablet, TAKE 1 TABLET (50 MG TOTAL) BY MOUTH DAILY., Disp: 90 tablet, Rfl: 3 .  metoprolol succinate (TOPROL-XL) 25 MG 24 hr tablet, TAKE 1 TABLET BY MOUTH ONCE A DAY, Disp: 90 tablet, Rfl: 3 .  Multiple Vitamins-Minerals (MULTIVITAMIN WITH MINERALS) tablet, Take 1 tablet by mouth daily., Disp: , Rfl:  .  oxyCODONE (OXYCONTIN) 20 mg 12 hr tablet, Take  1 tablet (20 mg total) by mouth every 12 (twelve) hours., Disp: 14 tablet, Rfl: 0 .  Oxycodone HCl 20 MG TABS, TAKE 1 TABLET BY MOUTH 3 TIMES DAILY AS NEEDED FOR PAIN * FOLLOW UP WITH DR [facility] IN JULY 2022, Disp: 90 tablet, Rfl: 0 .  Oxycodone HCl 20 MG TABS, TAKE 1 TABLET BY MOUTH 3 TIMES DAILY AS NEEDED FOR PAIN, Disp: 90 tablet, Rfl: 0 .  Oxycodone HCl 20 MG TABS, TAKE 1 TABLET BY MOUTH 3 TIMES DAILY AS NEEDED FOR PAIN, Disp: 69 tablet, Rfl: 0 .  Oxycodone HCl 20 MG TABS, TAKE 1 TABLET BY MOUTH 3 TIMES DAILY AS NEEDED FOR PAIN, Disp: 90 tablet, Rfl: 0 .  Oxycodone HCl 20 MG TABS, TAKE 1 TABLET BY MOUTH 3 TIMES DAILY AS NEEDED FOR PAIN, Disp: 90 tablet, Rfl: 0 .  Oxycodone HCl 20 MG TABS, TAKE 1 TABLET BY MOUTH THREE TIMES DAILY AS NEEDED FOR PAIN, Disp: 90 tablet, Rfl: 0 .  Oxycodone HCl 20 MG TABS, Take 1 tablet by mouth three times a day as needed for pain monthly contract.   Follow up with Dr Alfonso Ramus in July 2022, Disp: 90 tablet, Rfl: 0 .  polyethylene glycol (MIRALAX / GLYCOLAX) 17 g packet, Take 17 g by mouth daily as needed for mild constipation. , Disp: , Rfl:  .  predniSONE (STERAPRED UNI-PAK 21 TAB) 10 MG (21) TBPK tablet, TAKE 6 TABLETS BY MOUTH ON DAY 1 THEN DECREASE BY 1 TABLET DAILY UNTIL FINISHED (6-5-4-3-2-1), Disp: 21 each, Rfl: 0 .  predniSONE (STERAPRED UNI-PAK 21 TAB) 10 MG (21) TBPK tablet, take as directed, Disp: 21 tablet, Rfl: 0 .  predniSONE (STERAPRED UNI-PAK 48 TAB) 10 MG (48) TBPK tablet, TAKE BY MOUTH AS DIRECTED ON PACKAGE, Disp: 48 each, Rfl: 0 .  silodosin (RAPAFLO) 8 MG CAPS capsule, Take 1 capsule (8 mg total) by mouth daily with breakfast., Disp: 30 capsule, Rfl: 0 .  silodosin (RAPAFLO) 8 MG CAPS capsule, TAKE 1 CAPSULE BY MOUTH DAILY, Disp: 90 capsule, Rfl: 3 .  tamsulosin (FLOMAX) 0.4 MG CAPS capsule, TAKE 1 CAPSULE BY MOUTH AT BEDTIME, Disp: 90 capsule, Rfl: 3 .  topiramate (TOPAMAX) 25 MG tablet, Take 25 mg by mouth daily., Disp: , Rfl:  .  topiramate  (TOPAMAX) 25 MG tablet, TAKE 1 TABLET BY MOUTH ONCE DAILY, Disp: 90 tablet, Rfl: 3 .  umeclidinium-vilanterol (ANORO ELLIPTA) 62.5-25 MCG/INH AEPB, INHALE 1 PUFF BY MOUTH INTO THE LUNGS DAILY, Disp: 60 each, Rfl: 3  Past Medical History: Past Medical History:  Diagnosis Date  . Arthritis   . Cervical disc herniation   . COPD (chronic obstructive pulmonary disease) ([place])   . Headache    " due to antibiotics"  . Hepatitis  C   . Hypertension     Tobacco Use: Social History   Tobacco Use  Smoking Status Former Smoker  . Packs/day: 1.00  . Years: 32.00  . Pack years: 32.00  . Types: Cigarettes  . Start date: 40  . Quit date: 2002  . Years since quitting: 20.3  Smokeless Tobacco Never Used    Labs: Recent Chemical engineer    Labs for ITP Cardiac and Pulmonary Rehab Latest Ref Rng & Units 05/31/2017 06/14/2017 01/01/2020 01/02/2020 06/23/2020   Cholestrol 0 - 200 mg/dL - - - 151 -   LDLCALC 0 - 99 mg/dL - - - 87 -   HDL >40 mg/dL - - - 45 -   Trlycerides <150 mg/dL - - - 97 -   Hemoglobin A1c 4.8 - 5.6 % - 5.9(H) - 5.2 -   PHART 7.350 - 7.450 7.467(H) - - - 7.323(L)   PCO2ART 32.0 - 48.0 mmHg 35.3 - - - 52.9(H)   HCO3 20.0 - 28.0 mmol/L 25.1 - - - 26.7   TCO2 22 - 32 mmol/L - - 24 - -   ACIDBASEDEF 0.0 - 2.0 mmol/L - - - - -   O2SAT % 98.7 - - - 98.3      Capillary Blood Glucose: Lab Results  Component Value Date   GLUCAP 92 01/03/2020   GLUCAP 120 (H) 01/02/2020     Pulmonary Assessment Scores:  Pulmonary Assessment Scores    Row Name 11/17/20 1308 11/21/20 1602       ADL UCSD   ADL Phase Entry --    SOB Score total 35 --         CAT Score   CAT Score 13 --         mMRC Score   mMRC Score -- 2          UCSD: Self-administered rating of dyspnea associated with activities of daily living (ADLs) 6-point scale (0 = "not at all" to 5 = "maximal or unable to do because of breathlessness")  Scoring Scores range from 0 to 120.  Minimally important  difference is 5 units  CAT: CAT can identify the health impairment of COPD patients and is better correlated with disease progression.  CAT has a scoring range of zero to 40. The CAT score is classified into four groups of low (less than 10), medium (10 - 20), high (21-30) and very high (31-40) based on the impact level of disease on health status. A CAT score over 10 suggests significant symptoms.  A worsening CAT score could be explained by an exacerbation, poor medication adherence, poor inhaler technique, or progression of COPD or comorbid conditions.  CAT MCID is 2 points  mMRC: mMRC (Modified Medical Research Council) Dyspnea Scale is used to assess the degree of baseline functional disability in patients of respiratory disease due to dyspnea. No minimal important difference is established. A decrease in score of 1 point or greater is considered a positive change.   Pulmonary Function Assessment:   Exercise Target Goals: Exercise Program Goal: Individual exercise prescription set using results from initial 6 min walk test and THRR while considering  patient's activity barriers and safety.   Exercise Prescription Goal: Initial exercise prescription builds to 30-45 minutes a day of aerobic activity, 2-3 days per week.  Home exercise guidelines will be given to patient during program as part of exercise prescription that the participant will acknowledge.  Activity Barriers & Risk Stratification:  Activity Barriers &  Cardiac Risk Stratification - 11/17/20 1052      Activity Barriers & Cardiac Risk Stratification   Activity Barriers Arthritis;Right Knee Replacement;Neck/Spine Problems;Deconditioning;Shortness of Breath;History of Falls;Balance Concerns;Assistive Device    Cardiac Risk Stratification High           6 Minute Walk:  6 Minute Walk    Row Name 11/21/20 1557         6 Minute Walk   Phase Initial     Distance 1420 feet     Walk Time 6 minutes     # of Rest Breaks  0     MPH 2.69     METS 3.73     RPE 11     Perceived Dyspnea  1     VO2 Peak 13.06     Symptoms Yes (comment)     Comments Hip pain rate 3/10     Resting HR 67 bpm     Resting BP 140/68     Resting Oxygen Saturation  96 %     Exercise Oxygen Saturation  during 6 min walk 94 %     Max Ex. HR 86 bpm     Max Ex. BP 164/70     2 Minute Post BP 156/78           Interval HR   1 Minute HR 77     2 Minute HR 78     3 Minute HR 81     4 Minute HR 85     5 Minute HR 86     6 Minute HR 85     2 Minute Post HR 68     Interval Heart Rate? Yes           Interval Oxygen   Interval Oxygen? Yes     Baseline Oxygen Saturation % 96 %     1 Minute Oxygen Saturation % 95 %     1 Minute Liters of Oxygen 0 L     2 Minute Oxygen Saturation % 94 %     2 Minute Liters of Oxygen 0 L     3 Minute Oxygen Saturation % 95 %     3 Minute Liters of Oxygen 0 L     4 Minute Oxygen Saturation % 95 %     4 Minute Liters of Oxygen 0 L     5 Minute Oxygen Saturation % 95 %     5 Minute Liters of Oxygen 0 L     6 Minute Oxygen Saturation % 95 %     6 Minute Liters of Oxygen 0 L     2 Minute Post Oxygen Saturation % 98 %     2 Minute Post Liters of Oxygen 0 L            Oxygen Initial Assessment:  Oxygen Initial Assessment - 11/21/20 1602      Home Oxygen   Home Oxygen Device None    Sleep Oxygen Prescription None    Home Exercise Oxygen Prescription None    Home Resting Oxygen Prescription None      Initial 6 min Walk   Oxygen Used None      Program Oxygen Prescription   Program Oxygen Prescription None      Intervention   Short Term Goals To learn and understand importance of monitoring SPO2 with pulse oximeter and demonstrate accurate use of the pulse oximeter.;To learn and understand importance of maintaining oxygen saturations>88%;To learn and demonstrate  proper pursed lip breathing techniques or other breathing techniques.;To learn and demonstrate proper use of respiratory  medications    Long  Term Goals Verbalizes importance of monitoring SPO2 with pulse oximeter and return demonstration;Maintenance of O2 saturations>88%;Exhibits proper breathing techniques, such as pursed lip breathing or other method taught during program session;Compliance with respiratory medication;Demonstrates proper use of MDI's           Oxygen Re-Evaluation:  Oxygen Re-Evaluation    Row Name 12/05/20 1536 01/01/21 1631           Program Oxygen Prescription   Program Oxygen Prescription None None             Home Oxygen   Home Oxygen Device None None      Sleep Oxygen Prescription None None      Home Exercise Oxygen Prescription None None      Home Resting Oxygen Prescription None None      Compliance with Home Oxygen Use -- Yes             Goals/Expected Outcomes   Short Term Goals To learn and understand importance of monitoring SPO2 with pulse oximeter and demonstrate accurate use of the pulse oximeter.;To learn and understand importance of maintaining oxygen saturations>88%;To learn and demonstrate proper pursed lip breathing techniques or other breathing techniques.;To learn and demonstrate proper use of respiratory medications To learn and understand importance of monitoring SPO2 with pulse oximeter and demonstrate accurate use of the pulse oximeter.;To learn and understand importance of maintaining oxygen saturations>88%;To learn and demonstrate proper pursed lip breathing techniques or other breathing techniques.;To learn and demonstrate proper use of respiratory medications      Long  Term Goals Verbalizes importance of monitoring SPO2 with pulse oximeter and return demonstration;Maintenance of O2 saturations>88%;Exhibits proper breathing techniques, such as pursed lip breathing or other method taught during program session;Compliance with respiratory medication;Demonstrates proper use of MDI's Verbalizes importance of monitoring SPO2 with pulse oximeter and return  demonstration;Maintenance of O2 saturations>88%;Exhibits proper breathing techniques, such as pursed lip breathing or other method taught during program session;Compliance with respiratory medication;Demonstrates proper use of MDI's      Goals/Expected Outcomes compliance and understanding of oxygen saturation and pursed lip breathing. compliance and understanding of oxygen saturation and pursed lip breathing.             Oxygen Discharge (Final Oxygen Re-Evaluation):  Oxygen Re-Evaluation - 01/01/21 1631      Program Oxygen Prescription   Program Oxygen Prescription None      Home Oxygen   Home Oxygen Device None    Sleep Oxygen Prescription None    Home Exercise Oxygen Prescription None    Home Resting Oxygen Prescription None    Compliance with Home Oxygen Use Yes      Goals/Expected Outcomes   Short Term Goals To learn and understand importance of monitoring SPO2 with pulse oximeter and demonstrate accurate use of the pulse oximeter.;To learn and understand importance of maintaining oxygen saturations>88%;To learn and demonstrate proper pursed lip breathing techniques or other breathing techniques.;To learn and demonstrate proper use of respiratory medications    Long  Term Goals Verbalizes importance of monitoring SPO2 with pulse oximeter and return demonstration;Maintenance of O2 saturations>88%;Exhibits proper breathing techniques, such as pursed lip breathing or other method taught during program session;Compliance with respiratory medication;Demonstrates proper use of MDI's    Goals/Expected Outcomes compliance and understanding of oxygen saturation and pursed lip breathing.  Initial Exercise Prescription:  Initial Exercise Prescription - 11/21/20 1600      Date of Initial Exercise RX and Referring Provider   Date 11/21/20    Referring Provider Dr. Chase Caller    Expected Discharge Date 01/23/21      NuStep   Level 1    SPM 80    Minutes 30      Prescription  Details   Frequency (times per week) 2    Duration Progress to 30 minutes of continuous aerobic without signs/symptoms of physical distress      Intensity   THRR 40-80% of Max Heartrate 61-122    Ratings of Perceived Exertion 11-13    Perceived Dyspnea 0-4      Progression   Progression Continue to progress workloads to maintain intensity without signs/symptoms of physical distress.      Resistance Training   Training Prescription Yes    Weight orange bands    Reps 10-15           Perform Capillary Blood Glucose checks as needed.  Exercise Prescription Changes:  Exercise Prescription Changes    Row Name 11/28/20 1500 12/12/20 1500 12/26/20 1500         Response to Exercise   Blood Pressure (Admit) 144/80 136/70 140/80     Blood Pressure (Exercise) 140/60 138/48 120/70     Blood Pressure (Exit) 152/80 154/88 150/82     Heart Rate (Admit) 62 bpm 59 bpm 74 bpm     Heart Rate (Exercise) 86 bpm 64 bpm 92 bpm     Heart Rate (Exit) 62 bpm 59 bpm 95 bpm     Oxygen Saturation (Admit) 97 % 97 % 97 %     Oxygen Saturation (Exercise) 93 % 95 % 93 %     Oxygen Saturation (Exit) 96 % 94 % 95 %     Rating of Perceived Exertion (Exercise) _0 Perceived Dyspnea (Exercise) _1 Duration Progress to 30 minutes of  aerobic without signs/symptoms of physical distress Progress to 30 minutes of  aerobic without signs/symptoms of physical distress Continue with 30 min of aerobic exercise without signs/symptoms of physical distress.     Intensity Other (comment)  40-80% HR max THRR unchanged THRR unchanged           Progression   Progression Continue to progress workloads to maintain intensity without signs/symptoms of physical distress. Continue to progress workloads to maintain intensity without signs/symptoms of physical distress. Continue to progress workloads to maintain intensity without signs/symptoms of physical distress.           Resistance Training   Training  Prescription Yes Yes Yes     Weight orange bands orange bands orange bands     Reps 10-15 10-15 10-15     Time 10 Minutes 10 Minutes 10 Minutes           Recumbant Bike   Level -- 1.5 1.5     Watts -- -- 2.4     Minutes -- 15 15           NuStep   Level _2 the previous exercise change was wrong, should have been levle 3.     SPM 80 80 80     Minutes _3 METs 2.4 2.1 3            Exercise Comments:  Exercise Comments  [place] Name 11/23/20 1525           Exercise Comments Pt completed first day of exercise and struggled a bit. He stated he was very fatigued and exhausted from having a lot of doctors appointments and not getting much sleep. We originally had hime doing 15 minutes on the Nustep and 15 minutes walking the track with the rolator. He tolerated the Nustep well with no concerns, but when he started walking the track he was very unsteady even with a rollator. He walked 3 laps and we didnt feel he was safe anymore to walk the track. After doing that tlittle bit of exercise he was even more fatigued and exhausted. We decided it was best for him to do the cool down stretches and be done for the day. We will continue to monitor and progress with pt as he is able.              Exercise Goals and Review:  Exercise Goals    Row Name 11/17/20 1100             Exercise Goals   Increase Physical Activity Yes       Intervention Provide advice, education, support and counseling about physical activity/exercise needs.;Develop an individualized exercise prescription for aerobic and resistive training based on initial evaluation findings, risk stratification, comorbidities and participant's personal goals.       Expected Outcomes Short Term: Attend rehab on a regular basis to increase amount of physical activity.;Long Term: Add in home exercise to make exercise part of routine and to increase amount of physical activity.;Long Term: Exercising regularly at least 3-5  days a week.       Increase Strength and Stamina Yes       Intervention Provide advice, education, support and counseling about physical activity/exercise needs.;Develop an individualized exercise prescription for aerobic and resistive training based on initial evaluation findings, risk stratification, comorbidities and participant's personal goals.       Expected Outcomes Short Term: Increase workloads from initial exercise prescription for resistance, speed, and METs.;Short Term: Perform resistance training exercises routinely during rehab and add in resistance training at home;Long Term: Improve cardiorespiratory fitness, muscular endurance and strength as measured by increased METs and functional capacity (6MWT)       Able to understand and use rate of perceived exertion (RPE) scale Yes       Intervention Provide education and explanation on how to use RPE scale       Expected Outcomes Short Term: Able to use RPE daily in rehab to express subjective intensity level;Long Term:  Able to use RPE to guide intensity level when exercising independently       Able to understand and use Dyspnea scale Yes       Intervention Provide education and explanation on how to use Dyspnea scale       Expected Outcomes Short Term: Able to use Dyspnea scale daily in rehab to express subjective sense of shortness of breath during exertion;Long Term: Able to use Dyspnea scale to guide intensity level when exercising independently       Knowledge and understanding of Target Heart Rate Range (THRR) Yes       Intervention Provide education and explanation of THRR including how the numbers were predicted and where they are located for reference       Expected Outcomes Short Term: Able to state/look up THRR;Short Term: Able to use daily as guideline for intensity in rehab;Long Term: Able to  use THRR to govern intensity when exercising independently       Understanding of Exercise Prescription Yes       Intervention Provide  education, explanation, and written materials on patient's individual exercise prescription       Expected Outcomes Short Term: Able to explain program exercise prescription;Long Term: Able to explain home exercise prescription to exercise independently              Exercise Goals Re-Evaluation :  Exercise Goals Re-Evaluation    Row Name 12/05/20 1528 01/01/21 1626           Exercise Goal Re-Evaluation   Exercise Goals Review Increase Physical Activity;Increase Strength and Stamina;Able to understand and use rate of perceived exertion (RPE) scale;Able to understand and use Dyspnea scale;Knowledge and understanding of Target Heart Rate Range (THRR);Understanding of Exercise Prescription Increase Physical Activity;Increase Strength and Stamina;Able to understand and use rate of perceived exertion (RPE) scale;Able to understand and use Dyspnea scale;Knowledge and understanding of Target Heart Rate Range (THRR);Understanding of Exercise Prescription      Comments Patient has completed 4 exercise sessions and has tolerated the Nustep fairly well. Pt has been in program before but has had a stroke since last time. He wants to focus on walking but he is very unsteady on the track even with a rolator. Pt does not want to use a rolator and I am not comfortable with him walking the track right now even with a rolator. We will aim to have him walk the track for a small amount of time with the rolator and see how it tolerates it. He is currently exercising at 2.5 METS on the Nustep. Will continue to monitor and progress as he is able. Travis Salinas has completed 11 exercise sessions and has been consistent with workload and MET level increases. We have still not had him walk on the track due to balance concerns but he has progressed to riding a bike for 15 minutes in addition to 15 minutes on the Nustep. He also wanted to work with some hand weights in addition to the resistance bands so we have been using hand weights  with him the last week and he has tolerated that well. His goal is to gain weight and also to gain muscle mass. He has succcesfully gained a little weight and he has made progression with resistance training. He is currently exercising at 2.7 METS on the Nustep and 2.6 METS on the bike. Will continue to monitor and progress as he is able.      Expected Outcomes Through exercise at rehab and home, the patient will decrease shortness of breath with daily activities and will confident in carrying out an exercise regimn at home. Also, have patient understand his balance issues and limitations and agree to use assistive device when needed. Through exercise at rehab and home, the patient will decrease shortness of breath with daily activities and will confident in carrying out an exercise regimn at home. Also, have patient understand his balance issues and limitations and agree to use assistive device when needed.             Discharge Exercise Prescription (Final Exercise Prescription Changes):  Exercise Prescription Changes - 12/26/20 1500      Response to Exercise   Blood Pressure (Admit) 140/80    Blood Pressure (Exercise) 120/70    Blood Pressure (Exit) 150/82    Heart Rate (Admit) 74 bpm    Heart Rate (Exercise) 92 bpm  Heart Rate (Exit) 95 bpm    Oxygen Saturation (Admit) 97 %    Oxygen Saturation (Exercise) 93 %    Oxygen Saturation (Exit) 95 %    Rating of Perceived Exertion (Exercise) 9    Perceived Dyspnea (Exercise) 1    Duration Continue with 30 min of aerobic exercise without signs/symptoms of physical distress.    Intensity THRR unchanged      Progression   Progression Continue to progress workloads to maintain intensity without signs/symptoms of physical distress.      Resistance Training   Training Prescription Yes    Weight orange bands    Reps 10-15    Time 10 Minutes      Recumbant Bike   Level 1.5    Watts 2.4    Minutes 15      NuStep   Level 3   the previous  exercise change was wrong, should have been levle 3.   SPM 80    Minutes 15    METs 3           Nutrition:  Target Goals: Understanding of nutrition guidelines, daily intake of sodium '1500mg'$ , cholesterol '200mg'$ , calories 30% from fat and 7% or less from saturated fats, daily to have 5 or more servings of fruits and vegetables.  Biometrics:  Pre Biometrics - 11/21/20 1557      Pre Biometrics   Grip Strength 30 kg            Nutrition Therapy Plan and Nutrition Goals:  Nutrition Therapy & Goals - 12/12/20 1443      Nutrition Therapy   Diet Low Sodium       Personal Nutrition Goals   Nutrition Goal Pt to build a healthy plate including vegetables, fruits, whole grains, and low-fat dairy products in a heart healthy meal plan.    Personal Goal #2 Pt to watch out for sweets and added sugar    Personal Goal #3 Pt to read labels to reduce sugar and sodium      Intervention Plan   Intervention Prescribe, educate and counsel regarding individualized specific dietary modifications aiming towards targeted core components such as weight, hypertension, lipid management, diabetes, heart failure and other comorbidities.;Nutrition handout(s) given to patient.    Expected Outcomes Short Term Goal: Understand basic principles of dietary content, such as calories, fat, sodium, cholesterol and nutrients.;Long Term Goal: Adherence to prescribed nutrition plan.           Nutrition Assessments:  MEDIFICTS Score Key:  ?70 Need to make dietary changes   40-70 Heart Healthy Diet  ? 40 Therapeutic Level Cholesterol Diet  Flowsheet Row PULMONARY REHAB OTHER RESPIRATORY from 12/12/2020 in [place]  Picture Your Plate Total Score on Admission 34     Picture Your Plate Scores:  <81 Unhealthy dietary pattern with much room for improvement.  41-50 Dietary pattern unlikely to meet recommendations for good health and room for improvement.  51-60 More  healthful dietary pattern, with some room for improvement.   >60 Healthy dietary pattern, although there may be some specific behaviors that could be improved.    Nutrition Goals Re-Evaluation:  Nutrition Goals Re-Evaluation    [place] Name 12/12/20 1444 12/29/20 1045           Goals   Current Weight 148 lb (67.1 kg) 150 lb 9.2 oz (68.3 kg)      Nutrition Goal Pt to build a healthy plate including vegetables, fruits, whole grains, and  low-fat dairy products in a heart healthy meal plan. Pt to build a healthy plate including vegetables, fruits, whole grains, and low-fat dairy products in a heart healthy meal plan.             Personal Goal #2 Re-Evaluation   Personal Goal #2 Pt to watch out for sweets and added sugar Pt to watch out for sweets and added sugar             Personal Goal #3 Re-Evaluation   Personal Goal #3 Pt to read labels to reduce sugar and sodium Pt to read labels to reduce sugar and sodium             Nutrition Goals Discharge (Final Nutrition Goals Re-Evaluation):  Nutrition Goals Re-Evaluation - 12/29/20 1045      Goals   Current Weight 150 lb 9.2 oz (68.3 kg)    Nutrition Goal Pt to build a healthy plate including vegetables, fruits, whole grains, and low-fat dairy products in a heart healthy meal plan.      Personal Goal #2 Re-Evaluation   Personal Goal #2 Pt to watch out for sweets and added sugar      Personal Goal #3 Re-Evaluation   Personal Goal #3 Pt to read labels to reduce sugar and sodium           Psychosocial: Target Goals: Acknowledge presence or absence of significant depression and/or stress, maximize coping skills, provide positive support system. Participant is able to verbalize types and ability to use techniques and skills needed for reducing stress and depression.  Initial Review & Psychosocial Screening:  Initial Psych Review & Screening - 11/17/20 1200      Initial Review   Comments Pt complains of feeling bored.  He does not  drive and has to depend upon others to get out of the home.  Pt would like to do more but his shortness of breath keeps him from do much physical activity. Pt feel his medications work for him.      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 Setting desired goal for each stressor or current issue identified.;Long Term goal: The participant improves quality of Life and PHQ9 Scores as seen by post scores and/or verbalization of changes;Short Term goal: Identification and review with participant of any Quality of Life or Depression concerns found by scoring the questionnaire.           Quality of Life Scores:  Scores of 19 and below usually indicate a poorer quality of life in these areas.  A difference of  2-3 points is a clinically meaningful difference.  A difference of 2-3 points in the total score of the Quality of Life Index has been associated with significant improvement in overall quality of life, self-image, physical symptoms, and general health in studies assessing change in quality of life.  PHQ-9: Recent Review Flowsheet Data    Depression screen [facility] 2/9 11/17/2020 11/17/2020 02/03/2020 07/21/2018 04/06/2018   Decreased Interest 0 0 _0 Down, Depressed, Hopeless _1 PHQ - 2 Score _2 Altered sleeping 3 - 2 0 1   Tired, decreased energy 3 - _3 Change  in appetite 3 - 0 0 0   Feeling bad or failure about yourself  0 - 2 0 1   Trouble concentrating 1 - 0 0 0   Moving slowly or fidgety/restless 3 - _0 Suicidal thoughts 0 - 0 0 0   PHQ-9 Score 14 - _1 Difficult doing work/chores - - Somewhat difficult Somewhat difficult Somewhat difficult     Interpretation of Total Score  Total Score Depression  Severity:  1-4 = Minimal depression, 5-9 = Mild depression, 10-14 = Moderate depression, 15-19 = Moderately severe depression, 20-27 = Severe depression   Psychosocial Evaluation and Intervention:  Psychosocial Evaluation - 11/17/20 1116      Psychosocial Evaluation & Interventions   Interventions Encouraged to exercise with the program and follow exercise prescription;Stress management education;Relaxation education           Psychosocial Re-Evaluation:  Psychosocial Re-Evaluation    [place] Name 12/04/20 1411 12/28/20 0859           Psychosocial Re-Evaluation   Current issues with Current Stress Concerns History of Depression;Current Depression;Current Stress Concerns      Comments Travis Salinas's demeanor is erratic at times.  He does not always understand "the why" behind our program guidelines.  His gait is very unsteady and we will not let him exercise on the track. Travis Salinas seems to enjoy participating in pulmonary rehab, he gets bored at home, he has had a stroke and is unable to work.  The program gives him something to focus on.      Expected Outcomes For Travis Salinas to handle his stress in healthy ways. For Travis Salinas to handle his stress in healthy ways.      Interventions Stress management education;Relaxation education Stress management education;Relaxation education;Encouraged to attend Pulmonary Rehabilitation for the exercise      Continue Psychosocial Services  Follow up required by staff Follow up required by staff             Initial Review   Source of Stress Concerns Chronic Illness;Transportation;Unable to perform yard/household activities;Unable to participate in former interests or hobbies Chronic Illness;Unable to participate in former interests or hobbies;Unable to perform yard/household activities             Psychosocial Discharge (Final Psychosocial Re-Evaluation):  Psychosocial Re-Evaluation - 12/28/20 0859      Psychosocial Re-Evaluation   Current issues with History of  Depression;Current Depression;Current Stress Concerns    Comments Travis Salinas seems to enjoy participating in pulmonary rehab, he gets bored at home, he has had a stroke and is unable to work.  The program gives him something to focus on.    Expected Outcomes For Travis Salinas to handle his stress in healthy ways.    Interventions Stress management education;Relaxation education;Encouraged to attend Pulmonary Rehabilitation for the exercise    Continue Psychosocial Services  Follow up required by staff      Initial Review   Source of Stress Concerns Chronic Illness;Unable to participate in former interests or hobbies;Unable to perform yard/household activities           Education: Education Goals: Education classes will be provided on a weekly basis, covering required topics. Participant will state understanding/return demonstration of topics presented.  Learning Barriers/Preferences:  Learning Barriers/Preferences - 11/17/20 1116      Learning Barriers/Preferences   Learning Barriers Sight;Hearing    Learning Preferences Verbal Instruction;Group Instruction;Individual Instruction           Education Topics: Risk Factor  Reduction:  -Group instruction that is supported by a PowerPoint presentation. Instructor discusses the definition of a risk factor, different risk factors for pulmonary disease, and how the heart and lungs work together.     Nutrition for Pulmonary Patient:  -Group instruction provided by PowerPoint slides, verbal discussion, and written materials to support subject matter. The instructor gives an explanation and review of healthy diet recommendations, which includes a discussion on weight management, recommendations for fruit and vegetable consumption, as well as protein, fluid, caffeine, fiber, sodium, sugar, and alcohol. Tips for eating when patients are short of breath are discussed. Flowsheet Row PULMONARY REHAB OTHER RESPIRATORY from 07/16/2018 in [place]  Date 05/07/18  Educator Rodman Pickle  Instruction Review Code 2- Demonstrated Understanding      Pursed Lip Breathing:  -Group instruction that is supported by demonstration and informational handouts. Instructor discusses the benefits of pursed lip and diaphragmatic breathing and detailed demonstration on how to preform both.     Oxygen Safety:  -Group instruction provided by PowerPoint, verbal discussion, and written material to support subject matter. There is an overview of "What is Oxygen" and "Why do we need it".  Instructor also reviews how to create a safe environment for oxygen use, the importance of using oxygen as prescribed, and the risks of noncompliance. There is a brief discussion on traveling with oxygen and resources the patient may utilize. Flowsheet Row PULMONARY REHAB OTHER RESPIRATORY from 07/16/2018 in [place]  Date 06/11/18  Educator Cloyde Reams  Instruction Review Code 2- Demonstrated Understanding      Oxygen Equipment:  -Group instruction provided by [facility] Staff utilizing handouts, written materials, and equipment demonstrations.   Signs and Symptoms:  -Group instruction provided by written material and verbal discussion to support subject matter. Warning signs and symptoms of infection, stroke, and heart attack are reviewed and when to call the physician/911 reinforced. Tips for preventing the spread of infection discussed. Flowsheet Row PULMONARY REHAB OTHER RESPIRATORY from 07/16/2018 in [place]  Date 06/04/18  Educator rn  Instruction Review Code 2- Demonstrated Understanding      Advanced Directives:  -Group instruction provided by verbal instruction and written material to support subject matter. Instructor reviews Advanced Directive laws and proper instruction for filling out document.   Pulmonary Video:  -Group video education that reviews the importance of medication  and oxygen compliance, exercise, good nutrition, pulmonary hygiene, and pursed lip and diaphragmatic breathing for the pulmonary patient.   Exercise for the Pulmonary Patient:  -Group instruction that is supported by a PowerPoint presentation. Instructor discusses benefits of exercise, core components of exercise, frequency, duration, and intensity of an exercise routine, importance of utilizing pulse oximetry during exercise, safety while exercising, and options of places to exercise outside of rehab.     Pulmonary Medications:  -Verbally interactive group education provided by instructor with focus on inhaled medications and proper administration. Flowsheet Row PULMONARY REHAB OTHER RESPIRATORY from 07/16/2018 in [place]  Date 05/19/18  Educator pharm  Instruction Review Code 2- Demonstrated Understanding      Anatomy and Physiology of the Respiratory System and Intimacy:  -Group instruction provided by PowerPoint, verbal discussion, and written material to support subject matter. Instructor reviews respiratory cycle and anatomical components of the respiratory system and their functions. Instructor also reviews differences in obstructive and restrictive respiratory diseases with examples of each. Intimacy, Sex, and Sexuality differences are  reviewed with a discussion on how relationships can change when diagnosed with pulmonary disease. Common sexual concerns are reviewed. Flowsheet Row PULMONARY REHAB OTHER RESPIRATORY from 07/16/2018 in [place]  Date 07/16/18  Educator RN  Instruction Review Code 2- Demonstrated Understanding      MD DAY -A group question and answer session with a medical doctor that allows participants to ask questions that relate to their pulmonary disease state. Flowsheet Row PULMONARY REHAB OTHER RESPIRATORY from 07/16/2018 in [place]  Date 04/14/18  Educator  Dr. Nelda Marseille  Instruction Review Code 1- Verbalizes Understanding      OTHER EDUCATION -Group or individual verbal, written, or video instructions that support the educational goals of the pulmonary rehab program. Flowsheet Row PULMONARY REHAB OTHER RESPIRATORY from 12/07/2020 in [place]  Date 12/07/20  Educator Handout  [How to beat a sedentary lifestyle.]      Holiday Eating Survival Tips:  -Group instruction provided by PowerPoint slides, verbal discussion, and written materials to support subject matter. The instructor gives patients tips, tricks, and techniques to help them not only survive but enjoy the holidays despite the onslaught of food that accompanies the holidays.   Knowledge Questionnaire Score:  Knowledge Questionnaire Score - 11/17/20 1210      Knowledge Questionnaire Score   Pre Score 15/18           Core Components/Risk Factors/Patient Goals at Admission:  Personal Goals and Risk Factors at Admission - 11/17/20 1116      Core Components/Risk Factors/Patient Goals on Admission    Weight Management Weight Gain;Yes    Expected Outcomes Weight Gain: Understanding of general recommendations for a high calorie, high protein meal plan that promotes weight gain by distributing calorie intake throughout the day with the consumption for 4-5 meals, snacks, and/or supplements;Short Term: Continue to assess and modify interventions until short term weight is achieved;Long Term: Adherence to nutrition and physical activity/exercise program aimed toward attainment of established weight goal;Understanding recommendations for meals to include 15-35% energy as protein, 25-35% energy from fat, 35-60% energy from carbohydrates, less than 249m of dietary cholesterol, 20-35 gm of total fiber daily;Understanding of distribution of calorie intake throughout the day with the consumption of 4-5 meals/snacks    Improve shortness of breath with ADL's Yes     Intervention Provide education, individualized exercise plan and daily activity instruction to help decrease symptoms of SOB with activities of daily living.    Expected Outcomes Short Term: Improve cardiorespiratory fitness to achieve a reduction of symptoms when performing ADLs;Long Term: Be able to perform more ADLs without symptoms or delay the onset of symptoms    Hypertension Yes    Intervention Provide education on lifestyle modifcations including regular physical activity/exercise, weight management, moderate sodium restriction and increased consumption of fresh fruit, vegetables, and low fat dairy, alcohol moderation, and smoking cessation.;Monitor prescription use compliance.    Expected Outcomes Short Term: Continued assessment and intervention until BP is < 140/932mHG in hypertensive participants. < 130/8070mG in hypertensive participants with diabetes, heart failure or chronic kidney disease.;Long Term: Maintenance of blood pressure at goal levels.    Stress Yes    Intervention Offer individual and/or small group education and counseling on adjustment to heart disease, stress management and health-related lifestyle change. Teach and support self-help strategies.           Core Components/Risk Factors/Patient Goals Review:   Goals and Risk Factor Review  [place] Name 12/04/20 1415 12/28/20 0901           Core Components/Risk Factors/Patient Goals Review   Personal Goals Review Develop more efficient breathing techniques such as purse lipped breathing and diaphragmatic breathing and practicing self-pacing with activity.;Increase knowledge of respiratory medications and ability to use respiratory devices properly.;Improve shortness of breath with ADL's Develop more efficient breathing techniques such as purse lipped breathing and diaphragmatic breathing and practicing self-pacing with activity.;Increase knowledge of respiratory medications and ability to use respiratory devices  properly.;Improve shortness of breath with ADL's      Review Travis Salinas just started the program, has exercised 3 times in pulmonary rehab and is very weak and deconditioned.  He is exercising @ 1.8 mets on the nustep for 30 minutes. He does seem stronger since being in pulmonary rehab.  His balance has improved and his leg strength.  He is doing well with workload increases.      Expected Outcomes See admission goals. See admission goals.             Core Components/Risk Factors/Patient Goals at Discharge (Final Review):   Goals and Risk Factor Review - 12/28/20 0901      Core Components/Risk Factors/Patient Goals Review   Personal Goals Review Develop more efficient breathing techniques such as purse lipped breathing and diaphragmatic breathing and practicing self-pacing with activity.;Increase knowledge of respiratory medications and ability to use respiratory devices properly.;Improve shortness of breath with ADL's    Review He does seem stronger since being in pulmonary rehab.  His balance has improved and his leg strength.  He is doing well with workload increases.    Expected Outcomes See admission goals.           ITP Comments:   Comments: ITP REVIEW Pt is making expected progress toward pulmonary rehab goals after completing 11 sessions. Recommend continued exercise, life style modification, education, and utilization of breathing techniques to increase stamina and strength and decrease shortness of breath with exertion.

## 2021-01-03 LAB — BASIC METABOLIC PANEL
BUN/Creatinine Ratio: 14 (ref 10–24)
BUN: 15 mg/dL (ref 8–27)
CO2: 23 mmol/L (ref 20–29)
Calcium: 8.9 mg/dL (ref 8.6–10.2)
Chloride: 105 mmol/L (ref 96–106)
Creatinine, Ser: 1.04 mg/dL (ref 0.76–1.27)
Glucose: 87 mg/dL (ref 65–99)
Potassium: 4.4 mmol/L (ref 3.5–5.2)
Sodium: 142 mmol/L (ref 134–144)
eGFR: 78 mL/min/{1.73_m2} (ref >59)

## 2021-01-04 ENCOUNTER — Encounter (HOSPITAL_COMMUNITY)
Admission: RE | Admit: 2021-01-04 | Discharge: 2021-01-04 | Disposition: A | Discharge status: Home / Self Care | Payer: Medicare Other | Source: Ambulatory Visit | Attending: Internal Medicine | Admitting: Internal Medicine

## 2021-01-04 ENCOUNTER — Other Ambulatory Visit: Payer: Self-pay

## 2021-01-04 DIAGNOSIS — J438 Other emphysema: Secondary | ICD-10-CM

## 2021-01-04 NOTE — Progress Notes (Signed)
Daily Session Note  Patient Details  Name: Travis Salinas MRN: [id number] Date of Birth: 1951-04-10 Referring Provider:   April Manson Pulmonary Rehab Walk Test from 11/21/2020 in [place]  Referring Provider Dr. Chase Caller      Encounter Date: 01/04/2021  Check In:  Session Check In - 01/04/21 1458      Check-In   Supervising physician immediately available to respond to emergencies [facility] immediately available    Physician(s) Dr. Kurtis Bushman    Location [facility]    Staff Present Rosebud Poles, RN, BSN;Derick Seminara Ysidro Evert, RN;David Van Wert, MS, ACSM-CEP, CCRP, Exercise Physiologist    Virtual Visit No    Medication changes reported     No    Fall or balance concerns reported    No    Tobacco Cessation No Change    Warm-up and Cool-down Performed on first and last piece of equipment    Resistance Training Performed Yes    VAD Patient? No    PAD/SET Patient? No      Pain Assessment   Currently in Pain? No/denies    Multiple Pain Sites No           Capillary Blood Glucose: No results found for this or any previous visit (from the past 24 hour(s)).    Social History   Tobacco Use  Smoking Status Former Smoker  . Packs/day: 1.00  . Years: 32.00  . Pack years: 32.00  . Types: Cigarettes  . Start date: 43  . Quit date: 2002  . Years since quitting: 20.3  Smokeless Tobacco Never Used    Goals Met:  Exercise tolerated well No report of cardiac concerns or symptoms Strength training completed today  Goals Unmet:  Not Applicable  Comments: Service time is from 1400 to 1500    Dr. Fransico Him is Medical Director for Cardiac Rehab at [facility].

## 2021-01-08 ENCOUNTER — Other Ambulatory Visit: Payer: Self-pay

## 2021-01-08 ENCOUNTER — Encounter: Payer: Self-pay | Admitting: Adult Health

## 2021-01-08 ENCOUNTER — Other Ambulatory Visit (HOSPITAL_COMMUNITY): Payer: Self-pay

## 2021-01-08 ENCOUNTER — Ambulatory Visit: Payer: Medicare Other | Admitting: Adult Health

## 2021-01-08 VITALS — BP 149/79 | HR 58 | Ht 71.0 in | Wt 147.0 lb

## 2021-01-08 DIAGNOSIS — R519 Headache, unspecified: Secondary | ICD-10-CM | POA: Diagnosis not present

## 2021-01-08 DIAGNOSIS — I749 Embolism and thrombosis of unspecified artery: Secondary | ICD-10-CM

## 2021-01-08 DIAGNOSIS — I69398 Other sequelae of cerebral infarction: Secondary | ICD-10-CM

## 2021-01-08 DIAGNOSIS — I480 Paroxysmal atrial fibrillation: Secondary | ICD-10-CM

## 2021-01-08 DIAGNOSIS — R269 Unspecified abnormalities of gait and mobility: Secondary | ICD-10-CM

## 2021-01-08 MED ORDER — TOPIRAMATE 25 MG PO TABS
ORAL_TABLET | Freq: Every day | ORAL | 3 refills | Status: AC
  Filled 2021-01-08 – 2021-01-23 (×2): qty 90, 90d supply, fill #0

## 2021-01-08 NOTE — Progress Notes (Signed)
[facility] [address] [place]. [place] [address] [phone]       OFFICE FOLLOW-UP NOTE  Travis Salinas Date of Birth:  August 18, 1951 Medical Record Number:  [id number]     Reason for visit: Stroke follow-up  Chief Complaint  Patient presents with  . Follow-up    RM 14 with spouse (mary ellen) Pt is well and stable, no complaints        HPI:   Today, 01/08/2021, Travis Salinas returns for 54-month stroke follow-up accompanied by his ex-wife, [name]  Stable since prior visit without new stroke/TIA symptoms Residual mild occasional imbalance with improvement since prior visit Remains on topiramate schedule daily previously taking intermittently -reports "bad headache" about a month ago and over the past week, [name] has been ensuring he has been taking topiramate on a daily basis -denies any recent headaches.  Chronic cervical pain followed by Dr. Alfonso Ramus - he believes neck pain can contribute to headaches  Reports compliance on aspirin and atorvastatin without associated side effects Blood pressure today 149/79 - monitors at home and typically 120s/70s  Denies any additional cocaine or any other drug use, tobacco or EtOH use  Has been doing pulm rehab T/Th - plans on joining fitness club once completed   No further concerns at this time     History provided for reference purposes only Update 06/19/2020 JM: Travis Salinas returns for stroke follow-up accompanied by his ex-wife.  Stable from stroke standpoint with occasional imbalance with some improvement since prior visit.  He reports having good days and bad days and will have worsening imbalance with quick position changes.  He did have a fall 2 weeks ago after he bent over and quickly stood back up and lost his balance.  Residual minor skin abrasion otherwise no injuries.  Denies hitting his head.  Completed neuro rehab PT and plans on starting pulmonary rehab next week.  Denies new or  worsening stroke/TIA symptoms.  Remains on aspirin 81 mg daily without bleeding or bruising.  Remains on atorvastatin 40 mg daily without myalgias.  Blood pressure today 136/68.  Remains on Topamax without side effects for headache management with benefit.  No further concerns at this time.  Initial visit 03/06/2020 Dr. Leonie Man: Travis Salinas. Travis Salinas is a 70 year old Caucasian male seen today for initial office follow-up visit following hospital consultation for stroke in April 2021.  He has past medical history of hypertension, hyperlipidemia, hepatitis C, degenerative cervical spine disease, COPD who presented on 01/01/2020 with sudden onset of gait instability, several falls, left-sided numbness and bilateral lower extremity weakness.  He was given IV TPA uneventfully.  Urine drug screen was positive for cocaine.  CT scan of the head showed no acute abnormality but MRI scan showed by cerebral embolic infarcts involving bilateral ACA, left MCA and bilateral cerebellum.  MRI of the brain showed no large vessel stenosis or occlusion and carotid ultrasound showed no significant extracranial stenosis.  2D echo showed diminished ejection fraction of 35 to 40% but no definite embolus.  LDL cholesterol was 87 mg percent and hemoglobin A1c was 5.9.  Cardiology was consulted for Takotsubo like pattern on his echo and recommended addition of coverage and ARB.  Patient was transferred to inpatient rehab.  He states he did well and was discharged home after week.  He was doing quite well until last week when he fell going down the steps and landed on his forehead and he has an abrasion.  He did not lose consciousness.  But since then his balance has been off.  He has to walk slowly to avoid falls.  Is currently participating in outpatient therapy and walks with a walker long distances.  His balance he feels is still not good.  His blood pressures well controlled and today it is 102/64.  He is tolerating Lipitor well without muscle  aches and pains.  Is tolerating aspirin and Plavix but does bruise easily.  He states he has quit cocaine and drugs.  He was having daily headaches but since the addition of Topamax it seems to be working quite well and his headaches are not as frequent.  He is only taking 25 mg daily.     ROS:   14 system review of systems is positive for imbalance and all other systems negative  PMH:  Past Medical History:  Diagnosis Date  . Arthritis   . Cervical disc herniation   . COPD (chronic obstructive pulmonary disease) (Many)   . Headache    " due to antibiotics"  . Hepatitis C   . Hypertension     Social History:  Social History   Socioeconomic History  . Marital status: Married    Spouse name: Not on file  . Number of children: Not on file  . Years of education: Not on file  . Highest education level: Not on file  Occupational History  . Not on file  Tobacco Use  . Smoking status: Former Smoker    Packs/day: 1.00    Years: 32.00    Pack years: 32.00    Types: Cigarettes    Start date: 1968    Quit date: 2002    Years since quitting: 20.3  . Smokeless tobacco: Never Used  Vaping Use  . Vaping Use: Never used  Substance and Sexual Activity  . Alcohol use: No    Alcohol/week: 0.0 standard drinks  . Drug use: Not Currently    Types: Heroin, Cocaine    Comment: " relapsed once this year " 2018  . Sexual activity: Not on file  Other Topics Concern  . Not on file  Social History Narrative   Lives with roommate In [place]    Married, lives separate from wife   Right handed   Caffeine: occasionally coffee and coke    Social Determinants of Health   Financial Resource Strain: Not on file  Food Insecurity: Not on file  Transportation Needs: Not on file  Physical Activity: Not on file  Stress: Not on file  Social Connections: Not on file  Intimate Partner Violence: Not on file    Medications:   Current Outpatient Medications on File Prior to Visit  Medication Sig  Dispense Refill  . albuterol (VENTOLIN HFA) 108 (90 Base) MCG/ACT inhaler INHALE 2 PUFFS EVERY 4-6 HOURS 8.5 g 2  . alprazolam (XANAX) 2 MG tablet Take 1 mg by mouth 3 (three) times daily as needed for sleep or anxiety.    . Amantadine HCl 100 MG tablet TAKE 1/2 TO 1 TABLET BY MOUTH 2 TIMES DAILY 180 tablet 1  . ARIPiprazole (ABILIFY) 10 MG tablet TAKE 1 TABLET BY MOUTH EVERY MORNING 90 tablet 1  . aspirin EC 81 MG EC tablet Take 1 tablet (81 mg total) by mouth daily.    Marland Kitchen atorvastatin (LIPITOR) 40 MG tablet TAKE 1 TABLET BY MOUTH ONCE DAILY 90 tablet 3  . clonazePAM (KLONOPIN) 0.5 MG tablet Take 0.5 mg by mouth 2 (two) times daily  as needed for anxiety.    . clonazePAM (KLONOPIN) 0.5 MG tablet TAKE 1 TABLET BY MOUTH 2 TIMES DAILY AS NEEDED 180 tablet 1  . COVID-19 mRNA Vac-TriS, Pfizer, (PFIZER-BIONT COVID-19 VAC-TRIS) SUSP injection Inject into the muscle. 0.3 mL 0  . COVID-19 mRNA vaccine, Pfizer, 30 MCG/0.3ML injection AS DIRECTED .3 mL 0  . doxycycline (VIBRAMYCIN) 100 MG capsule TAKE 1 CAPSULE BY MOUTH 2 TIMES DAILY FOR 10 DAYS 20 capsule 0  . FLUoxetine (PROZAC) 10 MG capsule TAKE 1 CAPSULE BY MOUTH EVERY MORNING 90 capsule 1  . FLUoxetine (PROZAC) 10 MG capsule TAKE 1 CAPSULE BY MOUTH ONCE DAILY IN THE MORNING 90 capsule 1  . FLUoxetine (PROZAC) 10 MG tablet Take 1 tablet (10 mg total) by mouth daily. 30 tablet 3  . fluticasone (FLONASE) 50 MCG/ACT nasal spray Place 2 sprays into both nostrils as needed for allergies or rhinitis.     . fluticasone (FLONASE) 50 MCG/ACT nasal spray USE 1 SPRAY IN EACH NOSTRIL ONCE DAILY 16 g 5  . ipratropium-albuterol (DUONEB) 0.5-2.5 (3) MG/3ML SOLN Take 3 mLs by nebulization every 4 (four) hours as needed (sob).    Marland Kitchen lamoTRIgine (LAMICTAL) 200 MG tablet Take 1 tablet (200 mg total) by mouth every morning. 30 tablet 0  . lamoTRIgine (LAMICTAL) 200 MG tablet TAKE 1 TABLET BY MOUTH EVERY MORNING 90 tablet 3  . lamoTRIgine (LAMICTAL) 200 MG tablet TAKE 1  TABLET BY MOUTH ONCE DAILY EVERY MORNING 90 tablet 3  . losartan (COZAAR) 50 MG tablet TAKE 1 TABLET (50 MG TOTAL) BY MOUTH DAILY. 90 tablet 3  . metoprolol succinate (TOPROL-XL) 25 MG 24 hr tablet TAKE 1 TABLET BY MOUTH ONCE A DAY 90 tablet 3  . Multiple Vitamins-Minerals (MULTIVITAMIN WITH MINERALS) tablet Take 1 tablet by mouth daily.    Marland Kitchen oxyCODONE (OXYCONTIN) 20 mg 12 hr tablet Take 1 tablet (20 mg total) by mouth every 12 (twelve) hours. 14 tablet 0  . Oxycodone HCl 20 MG TABS TAKE 1 TABLET BY MOUTH 3 TIMES DAILY AS NEEDED FOR PAIN * FOLLOW UP WITH DR [facility] IN JULY 2022 90 tablet 0  . Oxycodone HCl 20 MG TABS TAKE 1 TABLET BY MOUTH 3 TIMES DAILY AS NEEDED FOR PAIN 90 tablet 0  . Oxycodone HCl 20 MG TABS TAKE 1 TABLET BY MOUTH 3 TIMES DAILY AS NEEDED FOR PAIN 69 tablet 0  . Oxycodone HCl 20 MG TABS TAKE 1 TABLET BY MOUTH 3 TIMES DAILY AS NEEDED FOR PAIN 90 tablet 0  . Oxycodone HCl 20 MG TABS TAKE 1 TABLET BY MOUTH 3 TIMES DAILY AS NEEDED FOR PAIN 90 tablet 0  . Oxycodone HCl 20 MG TABS TAKE 1 TABLET BY MOUTH THREE TIMES DAILY AS NEEDED FOR PAIN 90 tablet 0  . Oxycodone HCl 20 MG TABS Take 1 tablet by mouth three times a day as needed for pain monthly contract.   Follow up with Dr Alfonso Ramus in July 2022 90 tablet 0  . polyethylene glycol (MIRALAX / GLYCOLAX) 17 g packet Take 17 g by mouth daily as needed for mild constipation.     . predniSONE (STERAPRED UNI-PAK 21 TAB) 10 MG (21) TBPK tablet TAKE 6 TABLETS BY MOUTH ON DAY 1 THEN DECREASE BY 1 TABLET DAILY UNTIL FINISHED (6-5-4-3-2-1) 21 each 0  . predniSONE (STERAPRED UNI-PAK 21 TAB) 10 MG (21) TBPK tablet take as directed 21 tablet 0  . predniSONE (STERAPRED UNI-PAK 48 TAB) 10 MG (48) TBPK tablet TAKE BY  MOUTH AS DIRECTED ON PACKAGE 48 each 0  . silodosin (RAPAFLO) 8 MG CAPS capsule Take 1 capsule (8 mg total) by mouth daily with breakfast. 30 capsule 0  . silodosin (RAPAFLO) 8 MG CAPS capsule TAKE 1 CAPSULE BY MOUTH DAILY 90 capsule 3  .  tamsulosin (FLOMAX) 0.4 MG CAPS capsule TAKE 1 CAPSULE BY MOUTH AT BEDTIME 90 capsule 3  . topiramate (TOPAMAX) 25 MG tablet Take 25 mg by mouth daily.    Marland Kitchen topiramate (TOPAMAX) 25 MG tablet TAKE 1 TABLET BY MOUTH ONCE DAILY 90 tablet 3  . umeclidinium-vilanterol (ANORO ELLIPTA) 62.5-25 MCG/INH AEPB INHALE 1 PUFF BY MOUTH INTO THE LUNGS DAILY 60 each 3  . [DISCONTINUED] amiodarone (PACERONE) 100 MG tablet TAKE 1 TABLET BY MOUTH DAILY 90 tablet 3  . [DISCONTINUED] carvedilol (COREG) 3.125 MG tablet Take 1 tablet (3.125 mg total) by mouth 2 (two) times daily with a meal. 180 tablet 3   No current facility-administered medications on file prior to visit.    Allergies:   Allergies  Allergen Reactions  . Propoxyphene Nausea And Vomiting  . Amoxicillin Nausea And Vomiting  . Ciprofloxacin Nausea Only  . Depakote [Divalproex Sodium] Other (See Comments)    hallucinations    Physical Exam Today's Vitals   01/08/21 1014  BP: (!) 149/79  Pulse: (!) 58  Weight: 147 lb (66.7 kg)  Height: 5\' 11"  (1.803 m)   Body mass index is 20.5 kg/m.   General: Frail pleasant middle-age Caucasian male, seated, in no evident distress Head: head normocephalic and atraumatic.   Neck: supple with no carotid or supraclavicular bruits Cardiovascular: regular rate and rhythm, no murmurs Musculoskeletal: Multiple joint limited ROM 2/2 arthritis Vascular:  Normal pulses all extremities  Neurologic Exam Mental Status: Awake and fully alert.  Fluent speech language.  Oriented to place and time. Recent and remote memory intact. Attention span, concentration and fund of knowledge appropriate. Mood and affect appropriate.  Cranial Nerves: Pupils equal, briskly reactive to light. Extraocular movements full without nystagmus. Visual fields full to confrontation. Hearing diminished bilaterally.. Facial sensation intact.  Mild left lower facial asymmetry., tongue, palate moves normally and symmetrically.  Motor: Normal  bulk and tone. Normal strength in all tested extremity muscles Sensory.: intact to touch ,pinprick .position and vibratory sensation.  Coordination: Rapid alternating movements normal in all extremities. Finger-to-nose and heel-to-shin performed accurately bilaterally. Gait and Station: Arises from chair without difficulty. Stance is broad-based.  Gait demonstrates mild gait ataxia and imbalance upon initiating gait but quickly improves.  No use of assistive device.  Unable to perform tandem walk. reflexes: 1+ and symmetric. Toes downgoing.      ASSESSMENT/PLAN: 70 year old Caucasian male with by cerebral embolic infarcts in April 2021 secondary to cocaine related cardiomyopathy and vasculitis.  Multiple vascular risk factors of hypertension, hyperlipidemia, takotsubo cardiomyopathy, episode of WCT/A fib converted to NSR on amiodarone, tobacco and cocaine abuse.  He has daily headaches which appear to have responded quite well to Topamax.     1.  Cerebral embolic infarcts -Residual imbalance with gait ataxia and imbalance but overall continued improvement per report -Continue aspirin 81 mg daily and atorvastatin 40 mg daily for secondary stroke prevention -Continue to follow with PCP and cardiology for aggressive stroke risk factor management and maintain aggressive risk factor modification with strict control of hypertension with blood pressure goal below 130/90 and lipids with LDL cholesterol goal below 70 mg percent.   -Discussed continued substance (hx of cocaine and heroin use)  and tobacco cessation  2.  Daily headache -continue Topamax 25 mg daily for headache prophylaxis -refill provided -Recently taking intermittently with headache recurrence -recommend continue daily use and if no additional headaches at follow-up visit, will consider discontinuing.  3.  Paroxysmal atrial fibrillation -Remains normal sinus rhythm -Routinely followed by cardiology - OV note reviewed - may consider  initiating AC in the future as previously felt to not be a good AC candidate    Follow-up in 6 months or call earlier if needed   CC:  [place] provider: Dr. Lyn Records, Lattie Haw, MD     Frann Rider, [facility]  [facility] [address] [place], [place] [address]  Phone [phone] Fax [phone] Note: This document was prepared with digital dictation and possible smart phrase technology. Any transcriptional errors that result from this process are unintentional.

## 2021-01-08 NOTE — Patient Instructions (Signed)
Continue aspirin 81 mg daily  and atorvastatin for secondary stroke prevention  Continue Topamax 25 mg daily for headache prevention  Continue to follow up with PCP regarding cholesterol and blood pressure management  Maintain strict control of hypertension with blood pressure goal below 130/90 and cholesterol with LDL cholesterol (bad cholesterol) goal below 70 mg/dL.       Followup in the future with me in 6 months or call earlier if needed       Thank you for coming to see [place] at [facility]. I hope we have been able to provide you high quality care today.  You may receive a patient satisfaction survey over the next few weeks. We would appreciate your feedback and comments so that we may continue to improve ourselves and the health of our patients.

## 2021-01-09 ENCOUNTER — Encounter (HOSPITAL_COMMUNITY): Payer: Medicare Other

## 2021-01-09 ENCOUNTER — Encounter (HOSPITAL_COMMUNITY)
Admission: RE | Admit: 2021-01-09 | Discharge: 2021-01-09 | Disposition: A | Discharge status: Home / Self Care | Payer: Medicare Other | Source: Ambulatory Visit | Attending: Internal Medicine | Admitting: Internal Medicine

## 2021-01-09 ENCOUNTER — Other Ambulatory Visit: Payer: Self-pay

## 2021-01-09 ENCOUNTER — Telehealth (HOSPITAL_COMMUNITY): Payer: Self-pay | Admitting: Family Medicine

## 2021-01-09 VITALS — Wt 145.1 lb

## 2021-01-09 DIAGNOSIS — J438 Other emphysema: Secondary | ICD-10-CM | POA: Diagnosis not present

## 2021-01-09 NOTE — Progress Notes (Signed)
I have reviewed a Home Exercise Prescription with Travis Salinas. Marsel is currently exercising at home. Pt states that he walks around his complex with his roommate and also does the resistance band exercises 2-3 days a week. The patient was advised to continue walking and resistance training at least 3 days a week for 30-45 minutes. Octavia Bruckner is also considering getting a gym membership for once he completes pulmonary rehab. Rondall and I discussed how to progress their exercise prescription. The patient stated that their goals were to be able to play golf again and other activities that he enjoys. The patient stated that they understand the exercise prescription.  We reviewed exercise guidelines, target heart rate during exercise, RPE Scale, weather conditions, use of rescue inhaler, endpoints for exercise, warmup and cool down.  Patient is encouraged to come to me with any questions. I will continue to follow up with the patient to assist them with progression and safety.    Rick Duff MS, ACSM CEP 4:00 PM 01/09/2021

## 2021-01-09 NOTE — Progress Notes (Signed)
Daily Session Note  Patient Details  Name: Oslo Huntsman MRN: [id number] Date of Birth: 1950/11/20 Referring Provider:   April Manson Pulmonary Rehab Walk Test from 11/21/2020 in [place]  Referring Provider Dr. Chase Caller      Encounter Date: 01/09/2021  Check In:  Session Check In - 01/09/21 1415      Check-In   Supervising physician immediately available to respond to emergencies [facility] immediately available    Physician(s) Dr. Kurtis Bushman    Location [facility]    Staff Present Rosebud Poles, RN, BSN;Aubrina Nieman Ysidro Evert, RN;Olinty Celesta Aver, MS, ACSM CEP, Exercise Physiologist;Carlette Wilber Oliphant, RN, BSN;Ramon Dredge, RN, MHA    Virtual Visit No    Medication changes reported     No    Fall or balance concerns reported    No    Tobacco Cessation No Change    Warm-up and Cool-down Performed as group-led instruction    Resistance Training Performed Yes    VAD Patient? No    PAD/SET Patient? No      Pain Assessment   Currently in Pain? No/denies    Multiple Pain Sites No           Capillary Blood Glucose: No results found for this or any previous visit (from the past 24 hour(s)).   Exercise Prescription Changes - 01/09/21 1400      Response to Exercise   Blood Pressure (Admit) 140/80    Blood Pressure (Exercise) 170/88    Blood Pressure (Exit) 128/84    Heart Rate (Admit) 76 bpm    Heart Rate (Exercise) 91 bpm    Heart Rate (Exit) 75 bpm    Oxygen Saturation (Admit) 94 %    Oxygen Saturation (Exercise) 93 %    Oxygen Saturation (Exit) 96 %    Rating of Perceived Exertion (Exercise) 11    Perceived Dyspnea (Exercise) 1.5    Duration Continue with 30 min of aerobic exercise without signs/symptoms of physical distress.    Intensity THRR unchanged      Progression   Progression Continue to progress workloads to maintain intensity without signs/symptoms of physical distress.      Resistance Training    Training Prescription Yes    Weight orange bands    Reps 10-15    Time 10 Minutes      Recumbant Bike   Level 2.5    Minutes 15    METs 2.5      NuStep   Level 4    SPM 80    Minutes 15    METs 2.2           Social History   Tobacco Use  Smoking Status Former Smoker  . Packs/day: 1.00  . Years: 32.00  . Pack years: 32.00  . Types: Cigarettes  . Start date: 44  . Quit date: 2002  . Years since quitting: 20.3  Smokeless Tobacco Never Used    Goals Met:  Exercise tolerated well No report of cardiac concerns or symptoms Strength training completed today  Goals Unmet:  Not Applicable  Comments: Service time is from 1320 to 1420    Dr. Fransico Him is Medical Director for Cardiac Rehab at [facility].

## 2021-01-11 ENCOUNTER — Other Ambulatory Visit (HOSPITAL_COMMUNITY): Payer: Self-pay

## 2021-01-11 ENCOUNTER — Telehealth (HOSPITAL_COMMUNITY): Payer: Self-pay | Admitting: Family Medicine

## 2021-01-11 ENCOUNTER — Encounter (HOSPITAL_COMMUNITY): Payer: Medicare Other

## 2021-01-11 MED ORDER — ALBUTEROL SULFATE HFA 108 (90 BASE) MCG/ACT IN AERS
INHALATION_SPRAY | RESPIRATORY_TRACT | 2 refills | Status: AC
  Filled 2021-01-11: qty 8.5, 16d supply, fill #0

## 2021-01-12 ENCOUNTER — Other Ambulatory Visit (HOSPITAL_COMMUNITY): Payer: Self-pay

## 2021-01-15 ENCOUNTER — Other Ambulatory Visit (HOSPITAL_COMMUNITY): Payer: Self-pay

## 2021-01-15 MED ORDER — OXYCODONE HCL 20 MG PO TABS
ORAL_TABLET | ORAL | 0 refills | Status: DC
  Filled 2021-01-16: qty 90, 30d supply, fill #0

## 2021-01-16 ENCOUNTER — Other Ambulatory Visit (HOSPITAL_COMMUNITY): Payer: Self-pay

## 2021-01-16 ENCOUNTER — Encounter (HOSPITAL_COMMUNITY)
Admission: RE | Admit: 2021-01-16 | Discharge: 2021-01-16 | Disposition: A | Discharge status: Home / Self Care | Payer: Medicare Other | Source: Ambulatory Visit | Attending: Internal Medicine | Admitting: Internal Medicine

## 2021-01-16 ENCOUNTER — Other Ambulatory Visit: Payer: Self-pay

## 2021-01-16 DIAGNOSIS — J438 Other emphysema: Secondary | ICD-10-CM

## 2021-01-16 NOTE — Progress Notes (Signed)
Travis Salinas arrived to pulmonary rehab today to exercise, he was found to be hypertensive 170/94 and recheck 172/94.  He c/o arthritis pain in his neck and was very sore from a fall that occurred last week.  He has a history of elevated BP when he has a migraine headache or is in pain.  He has clonidine to take at home if his blood pressure is elevated.  He was sent home to take the clonidine, his ex-wife drove him home.

## 2021-01-18 ENCOUNTER — Encounter (HOSPITAL_COMMUNITY)
Admission: RE | Admit: 2021-01-18 | Discharge: 2021-01-18 | Disposition: A | Discharge status: Home / Self Care | Payer: Medicare Other | Source: Ambulatory Visit | Attending: Internal Medicine | Admitting: Internal Medicine

## 2021-01-18 ENCOUNTER — Other Ambulatory Visit: Payer: Self-pay

## 2021-01-18 DIAGNOSIS — J438 Other emphysema: Secondary | ICD-10-CM

## 2021-01-18 NOTE — Progress Notes (Signed)
Daily Session Note  Patient Details  Name: Travis Salinas MRN: [id number] Date of Birth: 07-13-1951 Referring Provider:   April Manson Pulmonary Rehab Walk Test from 11/21/2020 in [place]  Referring Provider Dr. Chase Caller      Encounter Date: 01/18/2021  Check In:  Session Check In - 01/18/21 1453      Check-In   Supervising physician immediately available to respond to emergencies [facility] immediately available    Physician(s) Dr. Lonny Prude    Location [facility]    Staff Present Rosebud Poles, RN, Isaac Laud, MS, ACSM-CEP, Exercise Physiologist;Annedrea Rosezella [place], RN, MHA;Carlette Wilber Oliphant, RN, Milus Glazier, MS, ACSM-CEP, CCRP, Exercise Physiologist    Virtual Visit No    Medication changes reported     No    Fall or balance concerns reported    No    Tobacco Cessation No Change    Warm-up and Cool-down Performed as group-led instruction    Resistance Training Performed Yes    VAD Patient? No    PAD/SET Patient? No      Pain Assessment   Currently in Pain? No/denies    Pain Score 0-No pain    Multiple Pain Sites No           Capillary Blood Glucose: No results found for this or any previous visit (from the past 24 hour(s)).    Social History   Tobacco Use  Smoking Status Former Smoker  . Packs/day: 1.00  . Years: 32.00  . Pack years: 32.00  . Types: Cigarettes  . Start date: 46  . Quit date: 2002  . Years since quitting: 20.3  Smokeless Tobacco Never Used    Goals Met:  Proper associated with RPD/PD & O2 Sat Exercise tolerated well Strength training completed today  Goals Unmet:  Not Applicable  Comments: Service time is from 1315 to 1430    Dr. Fransico Him is Medical Director for Cardiac Rehab at [facility].

## 2021-01-22 NOTE — Progress Notes (Signed)
Received a call from pt's ex wife who stated that pt tested positive for COVID-19 and will not be be able to attend his last exercise session. Pt was scheduled to graduate from pulmonary rehab 01/23/21. Due to pt's current status he will be discharged from the program. Will request door badge to be returned at some point.

## 2021-01-23 MED FILL — Metoprolol Succinate Tab ER 24HR 25 MG (Tartrate Equiv): ORAL | 90 days supply | Qty: 90 | Fill #0 | Status: AC

## 2021-01-24 ENCOUNTER — Other Ambulatory Visit (HOSPITAL_COMMUNITY): Payer: Self-pay

## 2021-01-24 ENCOUNTER — Other Ambulatory Visit: Payer: Self-pay | Admitting: Internal Medicine

## 2021-01-24 ENCOUNTER — Other Ambulatory Visit: Payer: Self-pay | Admitting: Urology

## 2021-01-24 DIAGNOSIS — R059 Cough, unspecified: Secondary | ICD-10-CM | POA: Diagnosis not present

## 2021-01-24 DIAGNOSIS — B349 Viral infection, unspecified: Secondary | ICD-10-CM | POA: Diagnosis not present

## 2021-01-24 DIAGNOSIS — R197 Diarrhea, unspecified: Secondary | ICD-10-CM | POA: Diagnosis not present

## 2021-01-24 DIAGNOSIS — R5383 Other fatigue: Secondary | ICD-10-CM | POA: Diagnosis not present

## 2021-01-24 MED ORDER — TAMSULOSIN HCL 0.4 MG PO CAPS
0.4000 mg | ORAL_CAPSULE | Freq: Every evening | ORAL | 3 refills | Status: AC
  Filled 2021-01-24: qty 90, 90d supply, fill #0

## 2021-01-25 ENCOUNTER — Other Ambulatory Visit: Payer: Self-pay | Admitting: Family Medicine

## 2021-01-25 ENCOUNTER — Other Ambulatory Visit (HOSPITAL_COMMUNITY): Payer: Self-pay

## 2021-01-25 MED ORDER — ANORO ELLIPTA 62.5-25 MCG/INH IN AEPB
1.0000 | INHALATION_SPRAY | Freq: Every day | RESPIRATORY_TRACT | 3 refills | Status: AC
  Filled 2021-01-25: qty 60, 30d supply, fill #0
  Filled 2021-03-06: qty 60, 30d supply, fill #1

## 2021-01-26 ENCOUNTER — Telehealth: Payer: Self-pay | Admitting: Oncology

## 2021-01-26 ENCOUNTER — Encounter: Payer: Self-pay | Admitting: Oncology

## 2021-01-26 ENCOUNTER — Other Ambulatory Visit (HOSPITAL_COMMUNITY): Payer: Self-pay

## 2021-01-26 MED ORDER — MOLNUPIRAVIR EUA 200MG CAPSULE
4.0000 | ORAL_CAPSULE | Freq: Two times a day (BID) | ORAL | 0 refills | Status: AC
  Filled 2021-01-26: qty 40, 5d supply, fill #0

## 2021-01-26 NOTE — Telephone Encounter (Signed)
Outpatient Oral COVID Treatment Note  I connected with Travis Salinas on 01/26/2021/1:39 PM by telephone and verified that I am speaking with the correct person using two identifiers.  I discussed the limitations, risks, security, and privacy concerns of performing an evaluation and management service by telephone and the availability of in person appointments. I also discussed with the patient that there may be a patient responsible charge related to this service. The patient expressed understanding and agreed to proceed.  Patient location: Home Provider location: Clinic   Diagnosis: COVID-19 infection  Purpose of visit: Discussion of potential use of Molnupiravir or Paxlovid, a new treatment for mild to moderate COVID-19 viral infection in non-hospitalized patients.   Subjective: Patient is a 70 y.o. male who has been diagnosed with COVID 19 viral infection.  Their symptoms began on 01/22/21.     Past Medical History:  Diagnosis Date  . Arthritis   . Cervical disc herniation   . COPD (chronic obstructive pulmonary disease) ([place])   . Headache    " due to antibiotics"  . Hepatitis C   . Hypertension     Allergies  Allergen Reactions  . Propoxyphene Nausea And Vomiting  . Amoxicillin Nausea And Vomiting  . Ciprofloxacin Nausea Only  . Depakote [Divalproex Sodium] Other (See Comments)    hallucinations     Current Outpatient Medications:  .  albuterol (VENTOLIN HFA) 108 (90 Base) MCG/ACT inhaler, INHALE 2 PUFFS EVERY 4-6 HOURS, Disp: 8.5 g, Rfl: 2 .  albuterol (VENTOLIN HFA) 108 (90 Base) MCG/ACT inhaler, Inhale 2 puffs into the lungs every 4-6 hours as needed, Disp: 8.5 g, Rfl: 2 .  alprazolam (XANAX) 2 MG tablet, Take 1 mg by mouth 3 (three) times daily as needed for sleep or anxiety., Disp: , Rfl:  .  Amantadine HCl 100 MG tablet, TAKE 1/2 TO 1 TABLET BY MOUTH 2 TIMES DAILY, Disp: 180 tablet, Rfl: 1 .  ARIPiprazole (ABILIFY) 10 MG tablet, TAKE 1 TABLET BY MOUTH EVERY  MORNING, Disp: 90 tablet, Rfl: 1 .  aspirin EC 81 MG EC tablet, Take 1 tablet (81 mg total) by mouth daily., Disp:  , Rfl:  .  atorvastatin (LIPITOR) 40 MG tablet, TAKE 1 TABLET BY MOUTH ONCE DAILY, Disp: 90 tablet, Rfl: 3 .  clonazePAM (KLONOPIN) 0.5 MG tablet, TAKE 1 TABLET BY MOUTH 2 TIMES DAILY AS NEEDED, Disp: 180 tablet, Rfl: 1 .  FLUoxetine (PROZAC) 10 MG capsule, TAKE 1 CAPSULE BY MOUTH EVERY MORNING, Disp: 90 capsule, Rfl: 1 .  fluticasone (FLONASE) 50 MCG/ACT nasal spray, USE 1 SPRAY IN EACH NOSTRIL ONCE DAILY, Disp: 16 g, Rfl: 5 .  ipratropium-albuterol (DUONEB) 0.5-2.5 (3) MG/3ML SOLN, Take 3 mLs by nebulization every 4 (four) hours as needed (sob)., Disp: , Rfl:  .  lamoTRIgine (LAMICTAL) 200 MG tablet, TAKE 1 TABLET BY MOUTH EVERY MORNING, Disp: 90 tablet, Rfl: 3 .  losartan (COZAAR) 50 MG tablet, TAKE 1 TABLET (50 MG TOTAL) BY MOUTH DAILY., Disp: 90 tablet, Rfl: 3 .  metoprolol succinate (TOPROL-XL) 25 MG 24 hr tablet, TAKE 1 TABLET BY MOUTH ONCE A DAY, Disp: 90 tablet, Rfl: 3 .  Multiple Vitamins-Minerals (MULTIVITAMIN WITH MINERALS) tablet, Take 1 tablet by mouth daily., Disp: , Rfl:  .  Oxycodone HCl 20 MG TABS, TAKE 1 TABLET BY MOUTH 3 TIMES DAILY AS NEEDED FOR PAIN * FOLLOW UP WITH DR [facility] IN JULY 2022, Disp: 90 tablet, Rfl: 0 .  Oxycodone HCl 20 MG TABS, Take  1 tablet by mouth three times a day as needed for pain. Follow up with Dr Alfonso Ramus in July 2022, Disp: 90 tablet, Rfl: 0 .  polyethylene glycol (MIRALAX / GLYCOLAX) 17 g packet, Take 17 g by mouth daily as needed for mild constipation.  (Patient not taking: Reported on 01/08/2021), Disp: , Rfl:  .  tamsulosin (FLOMAX) 0.4 MG CAPS capsule, TAKE 1 CAPSULE BY MOUTH AT BEDTIME, Disp: 90 capsule, Rfl: 3 .  tamsulosin (FLOMAX) 0.4 MG CAPS capsule, Take 1 capsule (0.4 mg total) by mouth at bedtime., Disp: 90 capsule, Rfl: 3 .  topiramate (TOPAMAX) 25 MG tablet, TAKE 1 TABLET BY MOUTH ONCE DAILY, Disp: 90 tablet, Rfl: 3 .   umeclidinium-vilanterol (ANORO ELLIPTA) 62.5-25 MCG/INH AEPB, Inhale 1 puff into the lungs daily., Disp: 60 each, Rfl: 3  Objective: Patient sounds well.  They are in no apparent distress.  Breathing is non labored.  Mood and behavior are normal.  Laboratory Data:  Recent Results (from the past 2160 hour(s))  Comprehensive metabolic panel     Status: None   Collection Time: 11/10/20 11:20 AM  Result Value Ref Range   Glucose CANCELED mg/dL    Comment: Test not performed. Insufficient specimen to perform or complete analysis.  Result canceled by the ancillary.    BUN CANCELED     Comment: Test not performed  Result canceled by the ancillary.    Creatinine, Ser CANCELED     Comment: Test not performed  Result canceled by the ancillary.    Sodium CANCELED     Comment: Test not performed  Result canceled by the ancillary.    Potassium CANCELED     Comment: Test not performed  Result canceled by the ancillary.    Chloride CANCELED     Comment: Test not performed  Result canceled by the ancillary.    CO2 CANCELED     Comment: Test not performed  Result canceled by the ancillary.    Calcium CANCELED     Comment: Test not performed  Result canceled by the ancillary.    Total Protein CANCELED     Comment: Test not performed  Result canceled by the ancillary.    Albumin CANCELED     Comment: Test not performed  Result canceled by the ancillary.    Bilirubin Total CANCELED     Comment: Test not performed  Result canceled by the ancillary.    Alkaline Phosphatase CANCELED     Comment: Test not performed  Result canceled by the ancillary.    AST CANCELED     Comment: Test not performed  Result canceled by the ancillary.    ALT CANCELED     Comment: Test not performed  Result canceled by the ancillary.   Comprehensive metabolic panel     Status: Abnormal   Collection Time: 11/14/20 12:30 PM  Result Value Ref Range   Glucose 94 65 - 99 mg/dL   BUN  32 (H) 8 - 27 mg/dL   Creatinine, Ser 1.38 (H) 0.76 - 1.27 mg/dL   eGFR 56 (L) >59 mL/min/1.73   BUN/Creatinine Ratio 23 10 - 24   Sodium 146 (H) 134 - 144 mmol/L   Potassium 4.6 3.5 - 5.2 mmol/L   Chloride 112 (H) 96 - 106 mmol/L    Comment: Salicylate-containing medications can falsely elevate chloride results using the Roche Integra methodology.    CO2 26 20 - 29 mmol/L   Calcium 9.6 8.6 - 10.2 mg/dL   Total  Protein 6.3 6.0 - 8.5 g/dL   Albumin 4.3 3.8 - 4.8 g/dL   Globulin, Total 2.0 1.5 - 4.5 g/dL   Albumin/Globulin Ratio 2.2 1.2 - 2.2   Bilirubin Total 0.4 0.0 - 1.2 mg/dL   Alkaline Phosphatase 99 44 - 121 IU/L   AST 47 (H) 0 - 40 IU/L   ALT 38 0 - 44 IU/L  Basic metabolic panel     Status: None   Collection Time: 01/02/21  3:34 PM  Result Value Ref Range   Glucose 87 65 - 99 mg/dL   BUN 15 8 - 27 mg/dL   Creatinine, Ser 1.04 0.76 - 1.27 mg/dL   eGFR 78 >59 mL/min/1.73   BUN/Creatinine Ratio 14 10 - 24   Sodium 142 134 - 144 mmol/L   Potassium 4.4 3.5 - 5.2 mmol/L   Chloride 105 96 - 106 mmol/L   CO2 23 20 - 29 mmol/L   Calcium 8.9 8.6 - 10.2 mg/dL     Assessment: 70 y.o. male with mild/moderate COVID 19 viral infection diagnosed on 01/24/21 at high risk for progression to severe COVID 19.  Plan:  This patient is a 70 y.o. male that meets the following criteria for Emergency Use Authorization of: Molnupiravir  1. Age >18 yr 2. SARS-COV-2 positive test 3. Symptom onset < 5 days 4. Mild-to-moderate COVID disease with high risk for severe progression to hospitalization or death   I have spoken and communicated the following to the patient or parent/caregiver regarding: 1. Molnupiravir is an unapproved drug that is authorized for use under an [employer].  2. There are no adequate, approved, available products for the treatment of COVID-19 in adults who have mild-to-moderate COVID-19 and are at high risk for progressing to severe COVID-19, including  hospitalization or death. 3. Other therapeutics are currently authorized. For additional information on all products authorized for treatment or prevention of COVID-19, please see [employer].  4. There are benefits and risks of taking this treatment as outlined in the "Fact Sheet for Patients and Caregivers."  5. "Fact Sheet for Patients and Caregivers" was reviewed with patient. A hard copy will be provided to patient from pharmacy prior to the patient receiving treatment. 6. Patients should continue to self-isolate and use infection control measures (e.g., wear mask, isolate, social distance, avoid sharing personal items, clean and disinfect "high touch" surfaces, and frequent handwashing) according to CDC guidelines.  7. The patient or parent/caregiver has the option to accept or refuse treatment. 8. [facility] has established a pregnancy surveillance program. 9. Females of childbearing potential should use a reliable method of contraception correctly and consistently, as applicable, for the duration of treatment and for 4 days after the last dose of Molnupiravir. 84. Males of reproductive potential who are sexually active with females of childbearing potential should use a reliable method of contraception correctly and consistently during treatment and for at least 3 months after the last dose. 11. Pregnancy status and risk was assessed. Patient verbalized understanding of precautions.   After reviewing above information with the patient, the patient agrees to receive molnupiravir.  Follow up instructions:    . Take prescription BID x 5 days as directed . Reach out to pharmacist for counseling on medication if desired . For concerns regarding further COVID symptoms please follow up with your PCP or urgent care . For urgent or life-threatening issues, seek care at your local  emergency department  The patient was provided an opportunity to  ask questions, and all were answered. The patient agreed with the plan and demonstrated an understanding of the instructions.   Script sent to [facility] and opted to pick up RX.  The patient was advised to call their PCP or seek an in-person evaluation if the symptoms worsen or if the condition fails to improve as anticipated.   I provided 15 minutes of non face-to-face telephone visit time during this encounter, and > 50% was spent counseling as documented under my assessment & plan.  Jacquelin Hawking, NP 01/26/2021 /1:39 PM

## 2021-01-30 ENCOUNTER — Telehealth: Payer: Self-pay | Admitting: Internal Medicine

## 2021-01-30 ENCOUNTER — Other Ambulatory Visit (HOSPITAL_COMMUNITY): Payer: Self-pay

## 2021-01-30 MED ORDER — PREDNISONE 10 MG PO TABS
ORAL_TABLET | ORAL | 0 refills | Status: DC
  Filled 2021-01-30: qty 11, 5d supply, fill #0

## 2021-01-30 NOTE — Telephone Encounter (Signed)
Pt's wife returning a phone call. Pt's wife can be reached at [phone]

## 2021-01-30 NOTE — Telephone Encounter (Signed)
pts wife stated that the molnupiravir is not helping.  He is having cough and congestion.  MR please advise. Thanks

## 2021-01-30 NOTE — Telephone Encounter (Signed)
Lm for patient.  

## 2021-01-30 NOTE — Telephone Encounter (Signed)
Called and spoke with patient's wife. She verbalized understanding of recommendations. She has requested to have prednisone sent to the [facility] outpatient pharmacy. RX has been sent.   Nothing further needed at time of call.

## 2021-01-30 NOTE — Telephone Encounter (Signed)
MR, please advise.  

## 2021-01-30 NOTE — Telephone Encounter (Signed)
  He probably has some COPD exacerbation and should take a short course of prednisone   Please take prednisone 40 mg x1 day, then 30 mg x1 day, then 20 mg x1 day, then 10 mg x1 day, and then 5 mg x1 day and stop   If sputum changes green he should call back for antibiotic   Allergies  Allergen Reactions  . Propoxyphene Nausea And Vomiting  . Amoxicillin Nausea And Vomiting  . Ciprofloxacin Nausea Only  . Depakote [Divalproex Sodium] Other (See Comments)    hallucinations

## 2021-02-04 ENCOUNTER — Emergency Department (HOSPITAL_COMMUNITY)
Admission: EM | Admit: 2021-02-04 | Discharge: 2021-02-04 | Disposition: A | Discharge status: Home / Self Care | Payer: Medicare Other | Attending: Emergency Medicine | Admitting: Emergency Medicine

## 2021-02-04 ENCOUNTER — Emergency Department (HOSPITAL_COMMUNITY): Payer: Medicare Other

## 2021-02-04 ENCOUNTER — Encounter (HOSPITAL_COMMUNITY): Payer: Self-pay | Admitting: Emergency Medicine

## 2021-02-04 DIAGNOSIS — Y9301 Activity, walking, marching and hiking: Secondary | ICD-10-CM | POA: Insufficient documentation

## 2021-02-04 DIAGNOSIS — J449 Chronic obstructive pulmonary disease, unspecified: Secondary | ICD-10-CM | POA: Diagnosis not present

## 2021-02-04 DIAGNOSIS — W010XXA Fall on same level from slipping, tripping and stumbling without subsequent striking against object, initial encounter: Secondary | ICD-10-CM | POA: Diagnosis not present

## 2021-02-04 DIAGNOSIS — Z7982 Long term (current) use of aspirin: Secondary | ICD-10-CM | POA: Diagnosis not present

## 2021-02-04 DIAGNOSIS — Z87891 Personal history of nicotine dependence: Secondary | ICD-10-CM | POA: Diagnosis not present

## 2021-02-04 DIAGNOSIS — S6991XA Unspecified injury of right wrist, hand and finger(s), initial encounter: Secondary | ICD-10-CM | POA: Diagnosis present

## 2021-02-04 DIAGNOSIS — M7989 Other specified soft tissue disorders: Secondary | ICD-10-CM | POA: Diagnosis not present

## 2021-02-04 DIAGNOSIS — S63501A Unspecified sprain of right wrist, initial encounter: Secondary | ICD-10-CM | POA: Diagnosis not present

## 2021-02-04 DIAGNOSIS — Z7952 Long term (current) use of systemic steroids: Secondary | ICD-10-CM | POA: Diagnosis not present

## 2021-02-04 DIAGNOSIS — I110 Hypertensive heart disease with heart failure: Secondary | ICD-10-CM | POA: Diagnosis not present

## 2021-02-04 DIAGNOSIS — Z79899 Other long term (current) drug therapy: Secondary | ICD-10-CM | POA: Insufficient documentation

## 2021-02-04 DIAGNOSIS — M25531 Pain in right wrist: Secondary | ICD-10-CM | POA: Diagnosis not present

## 2021-02-04 DIAGNOSIS — Y92009 Unspecified place in unspecified non-institutional (private) residence as the place of occurrence of the external cause: Secondary | ICD-10-CM | POA: Insufficient documentation

## 2021-02-04 DIAGNOSIS — S6391XA Sprain of unspecified part of right wrist and hand, initial encounter: Secondary | ICD-10-CM | POA: Diagnosis not present

## 2021-02-04 DIAGNOSIS — I5043 Acute on chronic combined systolic (congestive) and diastolic (congestive) heart failure: Secondary | ICD-10-CM | POA: Insufficient documentation

## 2021-02-04 NOTE — ED Provider Notes (Signed)
Emergency Medicine Provider Triage Evaluation Note  Travis Salinas , a 70 y.o. male  was evaluated in triage.  Pt complains of wrist injury.  Review of Systems  Positive: R wrist pain, tripped and fell Negative: numbness  Physical Exam  BP (!) 169/73 (BP Location: Right Arm)   Pulse 72   Temp 98.6 F (37 C)   Resp 18   SpO2 99%  Gen:   Awake, no distress   Resp:  Normal effort  MSK:   Moves extremities without difficulty  Other:  R wrist: closed deformity with tenderness to palpation  Medical Decision Making  Medically screening exam initiated at 2:59 PM.  Appropriate orders placed.  Travis Salinas was informed that the remainder of the evaluation will be completed by another provider, this initial triage assessment does not replace that evaluation, and the importance of remaining in the ED until their evaluation is complete.  Fell 3 days ago, noticing increasing pain and swelling about R wrist.    Domenic Moras, PA-C 02/04/21 1503    Hayden Rasmussen, MD 02/05/21 1011

## 2021-02-04 NOTE — ED Provider Notes (Signed)
[facility] EMERGENCY DEPARTMENT Provider Note   CSN: [id number] Arrival date & time: 02/04/21  1406     History Chief Complaint  Patient presents with  . Wrist Pain    ARA MANO Travis Salinas is a 70 y.o. male.  Patient is a 70 year old male with a history of hypertension, COPD, hepatitis, prior stroke with residual balance issues who presents today after a fall and injury to his right wrist.  This happened 3 days ago.  He was walking in his house and thinks he tripped over a chair and fell backwards trying to catch himself with his right arm.  Since that time he has had pain in the right wrist that radiates into the right thumb.  It is more painful with flexion and extension or palpating the area.  He denies any elbow tenderness.  No numbness or tingling of his fingers.  He denies any head injury during the fall.  He has not currently taken anything for the pain but because of the ongoing swelling and bruising he came because he was concerned he may have broken something.  The history is provided by the patient.  Wrist Pain       Past Medical History:  Diagnosis Date  . Arthritis   . Cervical disc herniation   . COPD (chronic obstructive pulmonary disease) ([place])   . Headache    " due to antibiotics"  . Hepatitis C   . Hypertension     Patient Active Problem List   Diagnosis Date Noted  . Rib fractures 06/23/2020  . Chronic pain syndrome   . Hypoalbuminemia due to protein-calorie malnutrition ([place])   . Acute blood loss anemia   . Benign prostatic hyperplasia   . Vascular headache   . Acute on chronic combined systolic and diastolic heart failure ([place])   . Sleep disturbance   . Embolic cerebral infarction ([place]) 01/06/2020  . Acute renal failure ([place])   . Left leg weakness   . Ventricular tachyarrhythmia ([place])   . Stroke (cerebrum) ([place]) 01/01/2020  . Normocytic anemia 06/04/2017  . COPD (chronic obstructive pulmonary disease) ([place]) 06/04/2017  .  Pneumococcal bacteremia 05/28/2017  . Pneumococcal pneumonia ([place]) 05/27/2017  . IVDU (intravenous drug user)   . Liver fibrosis 04/24/2016  . Chronic hepatitis C without hepatic coma ([place]) 04/19/2015  . Substance abuse ([place]) 04/19/2015  . HTN (hypertension) 11/12/2013  . Depression 11/12/2013    Past Surgical History:  Procedure Laterality Date  . APPLICATION OF A-CELL OF EXTREMITY Left 12/05/2016   Procedure: APPLICATION OF A-CELL OF EXTREMITY;  Surgeon: Loel Lofty Dillingham, DO;  Location: [facility];  Service: Plastics;  Laterality: Left;  . APPLICATION OF WOUND VAC Left 12/05/2016   Procedure: APPLICATION OF WOUND VAC;  Surgeon: Loel Lofty Dillingham, DO;  Location: [facility];  Service: Plastics;  Laterality: Left;  . I & D EXTREMITY Left 11/03/2016   Procedure: IRRIGATION AND DEBRIDEMENT EXTREMITY;  Surgeon: Leandrew Koyanagi, MD;  Location: [facility];  Service: Orthopedics;  Laterality: Left;  . I & D EXTREMITY Left 12/05/2016   Procedure: IRRIGATION AND DEBRIDEMENT EXTREMITY;  Surgeon: Loel Lofty Dillingham, DO;  Location: [facility];  Service: Plastics;  Laterality: Left;  . INCISION AND DRAINAGE ABSCESS Right 11/12/2013   Procedure: INCISION AND DRAINAGE AND OPEN PACKING OF RIGHT CALF  ABSCESS;  Surgeon: Earnstine Regal, MD;  Location: [facility];  Service: General;  Laterality: Right;  . INCISION AND DRAINAGE OF WOUND Left 01/29/2017  Procedure: IRRIGATION AND DEBRIDEMENT OF LEFT LEG WOUND WITH ABRA PLACEMENT AND PLACEMENT OF WOUND VAC;  Surgeon: Wallace Going, DO;  Location: [facility];  Service: Plastics;  Laterality: Left;  . KNEE SURGERY         Family History  Problem Relation Age of Onset  . Heart disease Mother   . Stroke Mother   . Heart disease Father   . Stroke Father     Social History   Tobacco Use  . Smoking status: Former Smoker    Packs/day: 1.00    Years: 32.00    Pack years: 32.00    Types: Cigarettes    Start date: 1968    Quit date: 2002    Years since quitting: 20.4  .  Smokeless tobacco: Never Used  Vaping Use  . Vaping Use: Never used  Substance Use Topics  . Alcohol use: No    Alcohol/week: 0.0 standard drinks  . Drug use: Not Currently    Types: Heroin, Cocaine    Comment: " relapsed once this year " 2018    Home Medications Prior to Admission medications   Medication Sig Start Date End Date Taking? Authorizing Provider  albuterol (VENTOLIN HFA) 108 (90 Base) MCG/ACT inhaler INHALE 2 PUFFS EVERY 4-6 HOURS 05/25/20 05/25/21  Kristen Loader, FNP  albuterol (VENTOLIN HFA) 108 (90 Base) MCG/ACT inhaler Inhale 2 puffs into the lungs every 4-6 hours as needed 01/11/21     alprazolam (XANAX) 2 MG tablet Take 1 mg by mouth 3 (three) times daily as needed for sleep or anxiety. 06/21/20   [provider]  Amantadine HCl 100 MG tablet TAKE 1/2 TO 1 TABLET BY MOUTH 2 TIMES DAILY 10/25/20 10/25/21  Pearson Grippe, MD  ARIPiprazole (ABILIFY) 10 MG tablet TAKE 1 TABLET BY MOUTH EVERY MORNING 10/25/20 10/25/21  Pearson Grippe, MD  aspirin EC 81 MG EC tablet Take 1 tablet (81 mg total) by mouth daily. 01/07/20   Metzger-Cihelka, York Cerise, NP  atorvastatin (LIPITOR) 40 MG tablet TAKE 1 TABLET BY MOUTH ONCE DAILY 01/19/20 01/18/21  Nahser, Wonda Cheng, MD  clonazePAM (KLONOPIN) 0.5 MG tablet TAKE 1 TABLET BY MOUTH 2 TIMES DAILY AS NEEDED 10/25/20 04/23/21  Pearson Grippe, MD  FLUoxetine (PROZAC) 10 MG capsule TAKE 1 CAPSULE BY MOUTH EVERY MORNING 10/25/20 10/25/21  Pearson Grippe, MD  fluticasone [facility]) 50 MCG/ACT nasal spray USE 1 SPRAY IN EACH NOSTRIL ONCE DAILY 05/25/20 05/25/21  Kristen Loader, FNP  ipratropium-albuterol (DUONEB) 0.5-2.5 (3) MG/3ML SOLN Take 3 mLs by nebulization every 4 (four) hours as needed (sob).    [provider]  lamoTRIgine (LAMICTAL) 200 MG tablet TAKE 1 TABLET BY MOUTH EVERY MORNING 10/25/20 10/25/21  Pearson Grippe, MD  losartan (COZAAR) 50 MG tablet TAKE 1 TABLET (50 MG TOTAL) BY MOUTH DAILY. 12/04/20 12/04/21  Richardson Dopp T, PA-C  metoprolol  succinate (TOPROL-XL) 25 MG 24 hr tablet TAKE 1 TABLET BY MOUTH ONCE A DAY 11/01/20 11/01/21  Richardson Dopp T, PA-C  Multiple Vitamins-Minerals (MULTIVITAMIN WITH MINERALS) tablet Take 1 tablet by mouth daily.    [provider]  Oxycodone HCl 20 MG TABS TAKE 1 TABLET BY MOUTH 3 TIMES DAILY AS NEEDED FOR PAIN * FOLLOW UP WITH DR [facility] [place] 2022 11/21/20 05/20/21  Berle Mull, MD  Oxycodone HCl 20 MG TABS Take 1 tablet by mouth three times a day as needed for pain. Follow up with Dr Alfonso Ramus in July 2022 01/16/21     polyethylene glycol (  MIRALAX / GLYCOLAX) 17 g packet Take 17 g by mouth daily as needed for mild constipation.  Patient not taking: Reported on 01/08/2021    [provider]  predniSONE (DELTASONE) 10 MG tablet Take 4 tablets by mouth x1 day-- 3 tabs x1 day-- 2 tabs x1 day-- 1 tab x1 day--1/2 tab x1 day. 01/30/21   Brand Males, MD  tamsulosin (FLOMAX) 0.4 MG CAPS capsule TAKE 1 CAPSULE BY MOUTH AT BEDTIME 09/05/20 09/05/21  Festus Aloe, MD  tamsulosin (FLOMAX) 0.4 MG CAPS capsule Take 1 capsule (0.4 mg total) by mouth at bedtime. 01/24/21     topiramate (TOPAMAX) 25 MG tablet TAKE 1 TABLET BY MOUTH ONCE DAILY 01/08/21 01/08/22  Frann Rider, NP  umeclidinium-vilanterol (ANORO ELLIPTA) 62.5-25 MCG/INH AEPB Inhale 1 puff into the lungs daily. 01/25/21 09/22/21  Brand Males, MD  amiodarone (PACERONE) 100 MG tablet TAKE 1 TABLET BY MOUTH DAILY 11/02/20 12/04/20  Nahser, Wonda Cheng, MD  carvedilol (COREG) 3.125 MG tablet Take 1 tablet (3.125 mg total) by mouth 2 (two) times daily with a meal. 01/25/20 11/01/20  Nahser, Wonda Cheng, MD    Allergies    Propoxyphene, Amoxicillin, Ciprofloxacin, and Depakote [divalproex sodium]  Review of Systems   Review of Systems  All other systems reviewed and are negative.   Physical Exam Updated Vital Signs BP (!) 169/73 (BP Location: Right Arm)   Pulse 72   Temp 98.6 F (37 C)   Resp 18   SpO2 99%   Physical Exam Vitals  and nursing note reviewed.  Constitutional:      General: He is not in acute distress.    Appearance: Normal appearance. He is normal weight.  HENT:     Head: Normocephalic.  Eyes:     Pupils: Pupils are equal, round, and reactive to light.  Cardiovascular:     Rate and Rhythm: Normal rate.     Pulses: Normal pulses.  Pulmonary:     Effort: Pulmonary effort is normal. No respiratory distress.  Musculoskeletal:        General: Tenderness and signs of injury present.     Right elbow: Normal.     Right wrist: Swelling, tenderness and bony tenderness present. Decreased range of motion.     Cervical back: Normal range of motion and neck supple.     Comments: Right wrist is swollen with hematoma over the distal radius.  Decreased flexion and extension due to pain.  Minimal snuffbox tenderness more pain with radial deviation of the wrist.  Right hand strength is 5 out of 5 with preserved sensation.  2+ radial pulse.  Skin:    General: Skin is warm and dry.     Capillary Refill: Capillary refill takes less than 2 seconds.     Findings: No lesion.  Neurological:     Mental Status: He is alert and oriented to person, place, and time. Mental status is at baseline.  Psychiatric:        Mood and Affect: Mood normal.        Behavior: Behavior normal.      ED Results / Procedures / Treatments   Labs (all labs ordered are listed, but only abnormal results are displayed) Labs Reviewed - No data to display  EKG None  Radiology DG Wrist Complete Right  Result Date: 02/04/2021 CLINICAL DATA:  [place] 3 days ago.  Right wrist pain and swelling. EXAM: RIGHT WRIST - COMPLETE 3+ VIEW COMPARISON:  None. FINDINGS: No fracture or bone lesion. Wrist  joints are normally spaced and aligned. No arthropathic changes. There is soft tissue swelling that predominates over the radial aspect of the wrist. IMPRESSION: 1. No fracture or dislocation. 2. Soft tissue swelling. Electronically Signed   By: Lajean Manes  M.D.   On: 02/04/2021 16:53    Procedures Procedures   Medications Ordered in ED Medications - No data to display  ED Course  I have reviewed the triage vital signs and the nursing notes.  Pertinent labs & imaging results that were available during my care of the patient were reviewed by me and considered in my medical decision making (see chart for details).    MDM Rules/Calculators/A&P                          Patient presenting after a fall and injury to the right wrist 3 days ago.  Patient does have significant swelling and some limited range of motion but no significant snuffbox tenderness.  However because the pain radiates down into the thumb will place in a thumb spica splint.  X-rays are negative.  We will have patient follow-up with hand for further evaluation in approximately 1 week.  MDM Number of Diagnoses or Management Options   Amount and/or Complexity of Data Reviewed Tests in the radiology section of CPT: ordered and reviewed Independent visualization of images, tracings, or specimens: yes   Final Clinical Impression(s) / ED Diagnoses Final diagnoses:  Sprain of right wrist, initial encounter    Rx / DC Orders ED Discharge Orders    None       Blanchie Dessert, MD 02/04/21 1704

## 2021-02-04 NOTE — ED Triage Notes (Signed)
Pt states he tripped and fell 3 days ago.  C/o pain and swelling to R wrist.

## 2021-02-04 NOTE — Discharge Instructions (Signed)
X-ray today was normal.  You most likely sprained your wrist however use the brace for the next 1-2 week all the time and follow up with the orthopeadist to make sure it is healing well.

## 2021-02-04 NOTE — Progress Notes (Signed)
Orthopedic Tech Progress Note Patient Details:  [name] 24-Sep-1950 [id number]  Ortho Devices Type of Ortho Device: Thumb velcro splint Ortho Device/Splint Location: Right Upper Extremity Ortho Device/Splint Interventions: Ordered,Application,Adjustment   Post Interventions Patient Tolerated: Well Instructions Provided: Adjustment of device,Care of device,Poper ambulation with device   Tammy Sours 02/04/2021, 5:39 PM

## 2021-02-12 ENCOUNTER — Other Ambulatory Visit (HOSPITAL_COMMUNITY): Payer: Self-pay

## 2021-02-13 ENCOUNTER — Other Ambulatory Visit (HOSPITAL_COMMUNITY): Payer: Self-pay

## 2021-02-13 MED ORDER — OXYCODONE HCL 20 MG PO TABS
ORAL_TABLET | ORAL | 0 refills | Status: DC
  Filled 2021-02-13: qty 90, 30d supply, fill #0

## 2021-02-14 ENCOUNTER — Other Ambulatory Visit (HOSPITAL_COMMUNITY): Payer: Self-pay

## 2021-02-16 ENCOUNTER — Inpatient Hospital Stay (HOSPITAL_BASED_OUTPATIENT_CLINIC_OR_DEPARTMENT_OTHER)
Admission: EM | Admit: 2021-02-16 | Discharge: 2021-02-19 | DRG: 871 | Disposition: A | Discharge status: Home Health | Payer: Medicare Other | Attending: Internal Medicine | Admitting: Internal Medicine

## 2021-02-16 ENCOUNTER — Other Ambulatory Visit: Payer: Self-pay

## 2021-02-16 ENCOUNTER — Emergency Department (HOSPITAL_BASED_OUTPATIENT_CLINIC_OR_DEPARTMENT_OTHER): Payer: Medicare Other

## 2021-02-16 ENCOUNTER — Encounter (HOSPITAL_BASED_OUTPATIENT_CLINIC_OR_DEPARTMENT_OTHER): Payer: Self-pay | Admitting: Emergency Medicine

## 2021-02-16 DIAGNOSIS — Z20822 Contact with and (suspected) exposure to covid-19: Secondary | ICD-10-CM | POA: Diagnosis not present

## 2021-02-16 DIAGNOSIS — M5020 Other cervical disc displacement, unspecified cervical region: Secondary | ICD-10-CM | POA: Diagnosis not present

## 2021-02-16 DIAGNOSIS — Z8249 Family history of ischemic heart disease and other diseases of the circulatory system: Secondary | ICD-10-CM | POA: Diagnosis not present

## 2021-02-16 DIAGNOSIS — A419 Sepsis, unspecified organism: Secondary | ICD-10-CM | POA: Diagnosis not present

## 2021-02-16 DIAGNOSIS — B182 Chronic viral hepatitis C: Secondary | ICD-10-CM | POA: Diagnosis present

## 2021-02-16 DIAGNOSIS — R000 Tachycardia, unspecified: Secondary | ICD-10-CM | POA: Diagnosis not present

## 2021-02-16 DIAGNOSIS — Z8616 Personal history of COVID-19: Secondary | ICD-10-CM | POA: Diagnosis not present

## 2021-02-16 DIAGNOSIS — M1990 Unspecified osteoarthritis, unspecified site: Secondary | ICD-10-CM | POA: Diagnosis not present

## 2021-02-16 DIAGNOSIS — Z7982 Long term (current) use of aspirin: Secondary | ICD-10-CM

## 2021-02-16 DIAGNOSIS — I10 Essential (primary) hypertension: Secondary | ICD-10-CM | POA: Diagnosis present

## 2021-02-16 DIAGNOSIS — N400 Enlarged prostate without lower urinary tract symptoms: Secondary | ICD-10-CM | POA: Diagnosis present

## 2021-02-16 DIAGNOSIS — F32A Depression, unspecified: Secondary | ICD-10-CM | POA: Diagnosis present

## 2021-02-16 DIAGNOSIS — Z823 Family history of stroke: Secondary | ICD-10-CM | POA: Diagnosis not present

## 2021-02-16 DIAGNOSIS — Z9981 Dependence on supplemental oxygen: Secondary | ICD-10-CM | POA: Diagnosis not present

## 2021-02-16 DIAGNOSIS — F1910 Other psychoactive substance abuse, uncomplicated: Secondary | ICD-10-CM | POA: Diagnosis present

## 2021-02-16 DIAGNOSIS — Z881 Allergy status to other antibiotic agents status: Secondary | ICD-10-CM

## 2021-02-16 DIAGNOSIS — R0602 Shortness of breath: Secondary | ICD-10-CM

## 2021-02-16 DIAGNOSIS — Z7951 Long term (current) use of inhaled steroids: Secondary | ICD-10-CM | POA: Diagnosis not present

## 2021-02-16 DIAGNOSIS — G894 Chronic pain syndrome: Secondary | ICD-10-CM | POA: Diagnosis not present

## 2021-02-16 DIAGNOSIS — F1490 Cocaine use, unspecified, uncomplicated: Secondary | ICD-10-CM | POA: Diagnosis present

## 2021-02-16 DIAGNOSIS — R6520 Severe sepsis without septic shock: Secondary | ICD-10-CM | POA: Diagnosis not present

## 2021-02-16 DIAGNOSIS — J189 Pneumonia, unspecified organism: Secondary | ICD-10-CM | POA: Diagnosis present

## 2021-02-16 DIAGNOSIS — L0390 Cellulitis, unspecified: Secondary | ICD-10-CM | POA: Diagnosis not present

## 2021-02-16 DIAGNOSIS — N179 Acute kidney failure, unspecified: Secondary | ICD-10-CM | POA: Diagnosis present

## 2021-02-16 DIAGNOSIS — Z8673 Personal history of transient ischemic attack (TIA), and cerebral infarction without residual deficits: Secondary | ICD-10-CM | POA: Diagnosis not present

## 2021-02-16 DIAGNOSIS — J9621 Acute and chronic respiratory failure with hypoxia: Secondary | ICD-10-CM | POA: Diagnosis present

## 2021-02-16 DIAGNOSIS — Z87891 Personal history of nicotine dependence: Secondary | ICD-10-CM

## 2021-02-16 DIAGNOSIS — F419 Anxiety disorder, unspecified: Secondary | ICD-10-CM | POA: Diagnosis not present

## 2021-02-16 DIAGNOSIS — Z888 Allergy status to other drugs, medicaments and biological substances status: Secondary | ICD-10-CM

## 2021-02-16 DIAGNOSIS — F418 Other specified anxiety disorders: Secondary | ICD-10-CM | POA: Diagnosis present

## 2021-02-16 DIAGNOSIS — Z79899 Other long term (current) drug therapy: Secondary | ICD-10-CM

## 2021-02-16 DIAGNOSIS — J440 Chronic obstructive pulmonary disease with acute lower respiratory infection: Secondary | ICD-10-CM | POA: Diagnosis not present

## 2021-02-16 DIAGNOSIS — L0291 Cutaneous abscess, unspecified: Secondary | ICD-10-CM | POA: Diagnosis not present

## 2021-02-16 DIAGNOSIS — J449 Chronic obstructive pulmonary disease, unspecified: Secondary | ICD-10-CM | POA: Diagnosis present

## 2021-02-16 DIAGNOSIS — I5030 Unspecified diastolic (congestive) heart failure: Secondary | ICD-10-CM | POA: Diagnosis not present

## 2021-02-16 DIAGNOSIS — J439 Emphysema, unspecified: Secondary | ICD-10-CM | POA: Diagnosis not present

## 2021-02-16 LAB — URINALYSIS, ROUTINE W REFLEX MICROSCOPIC
Bilirubin Urine: NEGATIVE
Cellular Cast, UA: 3
Glucose, UA: NEGATIVE mg/dL
Hgb urine dipstick: NEGATIVE
Ketones, ur: NEGATIVE mg/dL
Leukocytes,Ua: NEGATIVE
Nitrite: NEGATIVE
Protein, ur: 30 mg/dL — AB
Specific Gravity, Urine: 1.016 (ref 1.005–1.030)
pH: 5.5 (ref 5.0–8.0)

## 2021-02-16 LAB — RAPID URINE DRUG SCREEN, HOSP PERFORMED
Amphetamines: NOT DETECTED
Barbiturates: NOT DETECTED
Benzodiazepines: POSITIVE — AB
Cocaine: POSITIVE — AB
Opiates: POSITIVE — AB
Tetrahydrocannabinol: NOT DETECTED

## 2021-02-16 LAB — COMPREHENSIVE METABOLIC PANEL
ALT: 19 U/L (ref 0–44)
AST: 25 U/L (ref 15–41)
Albumin: 3.8 g/dL (ref 3.5–5.0)
Alkaline Phosphatase: 90 U/L (ref 38–126)
Anion gap: 12 (ref 5–15)
BUN: 23 mg/dL (ref 8–23)
CO2: 24 mmol/L (ref 22–32)
Calcium: 9.1 mg/dL (ref 8.9–10.3)
Chloride: 103 mmol/L (ref 98–111)
Creatinine, Ser: 1.6 mg/dL — ABNORMAL HIGH (ref 0.61–1.24)
GFR, Estimated: 47 mL/min — ABNORMAL LOW (ref >60)
Glucose, Bld: 118 mg/dL — ABNORMAL HIGH (ref 70–99)
Potassium: 3.9 mmol/L (ref 3.5–5.1)
Sodium: 139 mmol/L (ref 135–145)
Total Bilirubin: 0.9 mg/dL (ref 0.3–1.2)
Total Protein: 7 g/dL (ref 6.5–8.1)

## 2021-02-16 LAB — CBC WITH DIFFERENTIAL/PLATELET
Abs Immature Granulocytes: 0.09 10*3/uL — ABNORMAL HIGH (ref 0.00–0.07)
Basophils Absolute: 0 10*3/uL (ref 0.0–0.1)
Basophils Relative: 0 %
Eosinophils Absolute: 0.1 10*3/uL (ref 0.0–0.5)
Eosinophils Relative: 0 %
HCT: 37.4 % — ABNORMAL LOW (ref 39.0–52.0)
Hemoglobin: 11.5 g/dL — ABNORMAL LOW (ref 13.0–17.0)
Immature Granulocytes: 1 %
Lymphocytes Relative: 4 %
Lymphs Abs: 0.8 10*3/uL (ref 0.7–4.0)
MCH: 28.5 pg (ref 26.0–34.0)
MCHC: 30.7 g/dL (ref 30.0–36.0)
MCV: 92.8 fL (ref 80.0–100.0)
Monocytes Absolute: 1.8 10*3/uL — ABNORMAL HIGH (ref 0.1–1.0)
Monocytes Relative: 9 %
Neutro Abs: 16.2 10*3/uL — ABNORMAL HIGH (ref 1.7–7.7)
Neutrophils Relative %: 86 %
Platelets: 251 10*3/uL (ref 150–400)
RBC: 4.03 MIL/uL — ABNORMAL LOW (ref 4.22–5.81)
RDW: 13.3 % (ref 11.5–15.5)
WBC: 18.9 10*3/uL — ABNORMAL HIGH (ref 4.0–10.5)
nRBC: 0 % (ref 0.0–0.2)

## 2021-02-16 LAB — LACTIC ACID, PLASMA
Lactic Acid, Venous: 1.4 mmol/L (ref 0.5–1.9)
Lactic Acid, Venous: 0.9 mmol/L (ref 0.5–1.9)

## 2021-02-16 LAB — RESP PANEL BY RT-PCR (FLU A&B, COVID) ARPGX2
Influenza A by PCR: NEGATIVE
Influenza B by PCR: NEGATIVE
SARS Coronavirus 2 by RT PCR: NEGATIVE

## 2021-02-16 LAB — APTT: aPTT: 35 seconds (ref 24–36)

## 2021-02-16 LAB — PROTIME-INR
INR: 1 (ref 0.8–1.2)
Prothrombin Time: 13.3 seconds (ref 11.4–15.2)

## 2021-02-16 LAB — ETHANOL: Alcohol, Ethyl (B): 10 mg/dL — ABNORMAL HIGH (ref <10)

## 2021-02-16 LAB — AMMONIA: Ammonia: 33 umol/L (ref 9–35)

## 2021-02-16 MED ORDER — SODIUM CHLORIDE 0.9 % IV SOLN
INTRAVENOUS | Status: DC | PRN
  Administered 2021-02-16: 250 mL via INTRAVENOUS

## 2021-02-16 MED ORDER — ACETAMINOPHEN 325 MG PO TABS
650.0000 mg | ORAL_TABLET | Freq: Once | ORAL | Status: AC
  Administered 2021-02-16: 650 mg via ORAL
  Filled 2021-02-16: qty 2

## 2021-02-16 MED ORDER — FENTANYL CITRATE (PF) 100 MCG/2ML IJ SOLN
50.0000 ug | Freq: Once | INTRAMUSCULAR | Status: AC
  Administered 2021-02-16: 50 ug via INTRAVENOUS
  Filled 2021-02-16: qty 2

## 2021-02-16 MED ORDER — LAMOTRIGINE 100 MG PO TABS
200.0000 mg | ORAL_TABLET | Freq: Every day | ORAL | Status: DC
  Administered 2021-02-17 – 2021-02-19 (×3): 200 mg via ORAL
  Filled 2021-02-16 (×3): qty 2

## 2021-02-16 MED ORDER — ASPIRIN EC 81 MG PO TBEC
81.0000 mg | DELAYED_RELEASE_TABLET | Freq: Every day | ORAL | Status: DC
  Administered 2021-02-17 – 2021-02-19 (×3): 81 mg via ORAL
  Filled 2021-02-16 (×3): qty 1

## 2021-02-16 MED ORDER — OXYCODONE HCL 5 MG PO TABS
20.0000 mg | ORAL_TABLET | Freq: Three times a day (TID) | ORAL | Status: DC | PRN
  Administered 2021-02-17 – 2021-02-19 (×7): 20 mg via ORAL
  Filled 2021-02-16 (×7): qty 4

## 2021-02-16 MED ORDER — IBUPROFEN 800 MG PO TABS
800.0000 mg | ORAL_TABLET | Freq: Once | ORAL | Status: AC
  Administered 2021-02-16: 800 mg via ORAL
  Filled 2021-02-16: qty 1

## 2021-02-16 MED ORDER — ALPRAZOLAM 0.5 MG PO TABS
1.0000 mg | ORAL_TABLET | Freq: Three times a day (TID) | ORAL | Status: DC | PRN
  Administered 2021-02-17 – 2021-02-19 (×3): 1 mg via ORAL
  Filled 2021-02-16 (×3): qty 2

## 2021-02-16 MED ORDER — UMECLIDINIUM-VILANTEROL 62.5-25 MCG/INH IN AEPB
1.0000 | INHALATION_SPRAY | Freq: Every day | RESPIRATORY_TRACT | Status: DC
  Administered 2021-02-17 – 2021-02-19 (×3): 1 via RESPIRATORY_TRACT
  Filled 2021-02-16: qty 14

## 2021-02-16 MED ORDER — LACTATED RINGERS IV SOLN: INTRAVENOUS | Status: AC

## 2021-02-16 MED ORDER — FLUOXETINE HCL 10 MG PO CAPS
10.0000 mg | ORAL_CAPSULE | Freq: Every day | ORAL | Status: DC
  Administered 2021-02-17 – 2021-02-19 (×3): 10 mg via ORAL
  Filled 2021-02-16 (×3): qty 1

## 2021-02-16 MED ORDER — SODIUM CHLORIDE 0.9 % IV SOLN
2.0000 g | INTRAVENOUS | Status: DC
  Administered 2021-02-16 – 2021-02-17 (×2): 2 g via INTRAVENOUS
  Filled 2021-02-16 (×2): qty 20

## 2021-02-16 MED ORDER — SODIUM CHLORIDE 0.9 % IV SOLN
2.0000 g | Freq: Once | INTRAVENOUS | Status: AC
  Administered 2021-02-16: 2 g via INTRAVENOUS
  Filled 2021-02-16: qty 2

## 2021-02-16 MED ORDER — ACETAMINOPHEN 325 MG PO TABS
650.0000 mg | ORAL_TABLET | Freq: Four times a day (QID) | ORAL | Status: DC | PRN
  Administered 2021-02-17: 650 mg via ORAL
  Filled 2021-02-16: qty 2

## 2021-02-16 MED ORDER — ACETAMINOPHEN 650 MG RE SUPP: 650.0000 mg | Freq: Four times a day (QID) | RECTAL | Status: DC | PRN

## 2021-02-16 MED ORDER — ARIPIPRAZOLE 10 MG PO TABS
10.0000 mg | ORAL_TABLET | Freq: Every day | ORAL | Status: DC
  Administered 2021-02-17 – 2021-02-19 (×3): 10 mg via ORAL
  Filled 2021-02-16 (×3): qty 1

## 2021-02-16 MED ORDER — ENOXAPARIN SODIUM 40 MG/0.4ML IJ SOSY
40.0000 mg | PREFILLED_SYRINGE | INTRAMUSCULAR | Status: DC
  Administered 2021-02-16 – 2021-02-18 (×3): 40 mg via SUBCUTANEOUS
  Filled 2021-02-16 (×3): qty 0.4

## 2021-02-16 MED ORDER — ONDANSETRON HCL 4 MG/2ML IJ SOLN: 4.0000 mg | Freq: Four times a day (QID) | INTRAMUSCULAR | Status: DC | PRN

## 2021-02-16 MED ORDER — ONDANSETRON HCL 4 MG PO TABS: 4.0000 mg | ORAL_TABLET | Freq: Four times a day (QID) | ORAL | Status: DC | PRN

## 2021-02-16 MED ORDER — SODIUM CHLORIDE 0.9 % IV SOLN: 2.0000 g | Freq: Two times a day (BID) | INTRAVENOUS | Status: DC

## 2021-02-16 MED ORDER — LACTATED RINGERS IV BOLUS (SEPSIS)
1000.0000 mL | Freq: Once | INTRAVENOUS | Status: AC
  Administered 2021-02-16: 1000 mL via INTRAVENOUS

## 2021-02-16 MED ORDER — AZITHROMYCIN 500 MG IV SOLR
500.0000 mg | INTRAVENOUS | Status: DC
  Administered 2021-02-16 – 2021-02-18 (×3): 500 mg via INTRAVENOUS
  Filled 2021-02-16 (×5): qty 500

## 2021-02-16 MED ORDER — VANCOMYCIN HCL IN DEXTROSE 1-5 GM/200ML-% IV SOLN: 1000.0000 mg | INTRAVENOUS | Status: DC

## 2021-02-16 MED ORDER — TAMSULOSIN HCL 0.4 MG PO CAPS
0.4000 mg | ORAL_CAPSULE | Freq: Every day | ORAL | Status: DC
  Administered 2021-02-17 – 2021-02-18 (×2): 0.4 mg via ORAL
  Filled 2021-02-16 (×2): qty 1

## 2021-02-16 MED ORDER — POLYETHYLENE GLYCOL 3350 17 G PO PACK: 17.0000 g | PACK | Freq: Every day | ORAL | Status: DC | PRN

## 2021-02-16 MED ORDER — ATORVASTATIN CALCIUM 40 MG PO TABS
40.0000 mg | ORAL_TABLET | Freq: Every day | ORAL | Status: DC
  Administered 2021-02-17 – 2021-02-19 (×3): 40 mg via ORAL
  Filled 2021-02-16 (×3): qty 1

## 2021-02-16 MED ORDER — FLUTICASONE PROPIONATE 50 MCG/ACT NA SUSP
1.0000 | Freq: Every day | NASAL | Status: DC
  Administered 2021-02-17 – 2021-02-19 (×3): 1 via NASAL
  Filled 2021-02-16: qty 16

## 2021-02-16 MED ORDER — METRONIDAZOLE 500 MG/100ML IV SOLN
500.0000 mg | Freq: Once | INTRAVENOUS | Status: AC
  Administered 2021-02-16: 500 mg via INTRAVENOUS
  Filled 2021-02-16: qty 100

## 2021-02-16 MED ORDER — TOPIRAMATE 25 MG PO TABS
25.0000 mg | ORAL_TABLET | Freq: Every day | ORAL | Status: DC
  Administered 2021-02-17 – 2021-02-19 (×3): 25 mg via ORAL
  Filled 2021-02-16 (×3): qty 1

## 2021-02-16 MED ORDER — SENNOSIDES-DOCUSATE SODIUM 8.6-50 MG PO TABS: 1.0000 | ORAL_TABLET | Freq: Every evening | ORAL | Status: DC | PRN

## 2021-02-16 MED ORDER — IPRATROPIUM-ALBUTEROL 0.5-2.5 (3) MG/3ML IN SOLN: 3.0000 mL | RESPIRATORY_TRACT | Status: DC | PRN

## 2021-02-16 MED ORDER — ALBUTEROL SULFATE (2.5 MG/3ML) 0.083% IN NEBU: 2.5000 mg | INHALATION_SOLUTION | RESPIRATORY_TRACT | Status: DC | PRN

## 2021-02-16 MED ORDER — VANCOMYCIN HCL IN DEXTROSE 1-5 GM/200ML-% IV SOLN
1000.0000 mg | Freq: Once | INTRAVENOUS | Status: AC
  Administered 2021-02-16: 1000 mg via INTRAVENOUS
  Filled 2021-02-16: qty 200

## 2021-02-16 MED ORDER — GUAIFENESIN ER 600 MG PO TB12
600.0000 mg | ORAL_TABLET | Freq: Two times a day (BID) | ORAL | Status: DC
  Administered 2021-02-16 – 2021-02-19 (×6): 600 mg via ORAL
  Filled 2021-02-16 (×6): qty 1

## 2021-02-16 NOTE — Progress Notes (Signed)
-  received call from Dr. Jeanell Sparrow in regards to transferring patient for admit to [facility] 70 y.o with hx of polysubstance abuse, COPD, HTN, hepatitis, prior CVA who was diagnosed with covid on 5/16 and treated with antiviral and prednisone. Feeling better, but recently started to have back pain and not feeling well. Came to [facility] and found to have RUL pneumonia. Oxygen was 93-94%. Mild AKI with creatinine to 1.6. lactic acid wnl. He was given 1L NS and put on oxygen. Cultures done and given broad spectrum abx with cefepime, flagyl and vancomycin.  UDS: +cocaine, opiates and BZD.   We will admit and see him when he arrives.   Orma Flaming, MD [facility]

## 2021-02-16 NOTE — ED Notes (Signed)
IV attempted x2 w/o success. RN at bedside; no complications noted with IV attempt

## 2021-02-16 NOTE — Progress Notes (Signed)
Elink following Code Sepsis. 

## 2021-02-16 NOTE — ED Provider Notes (Signed)
[facility] EMERGENCY DEPT Provider Note   CSN: [id number] Arrival date & time: 02/16/21  1223     History Chief Complaint  Patient presents with   Back Pain    Travis Salinas [name] is a 70 y.o. male.  HPI  70 year old man history of hep C, COPD, COVID diagnosis mid May, CHF, stroke presents today with complaints of upper back pain.  Patient is moaning and is not giving a very good history.  His ex-wife is present, she states she is his main caretaker and she is giving the majority of the history.  States he called her today and complains of upper back pain.  She brought him to the emergency department secondary to this.  She reports that he had COVID diagnosed mid May and had some coughing with that.  His pulmonologist prescribed some steroids towards the end of the month.  He was treated with antivirals.  He had been improved from the [place] until this occurred today.  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   Acute renal failure (HCC)    Left leg weakness    Ventricular tachyarrhythmia ([place])    Stroke (cerebrum) ([place]) 01/01/2020   Normocytic anemia 06/04/2017   COPD (chronic obstructive pulmonary disease) ([place]) 06/04/2017   Pneumococcal bacteremia 05/28/2017   Pneumococcal pneumonia ([place]) 05/27/2017   IVDU (intravenous drug user)    Liver fibrosis 04/24/2016   Chronic hepatitis C without hepatic coma ([place])  04/19/2015   Substance abuse ([place]) 04/19/2015   HTN (hypertension) 11/12/2013   Depression 11/12/2013    Past Surgical History:  Procedure Laterality Date   APPLICATION OF A-CELL OF EXTREMITY Left 12/05/2016   Procedure: APPLICATION OF A-CELL OF EXTREMITY;  Surgeon: Loel Lofty Dillingham, DO;  Location: [place];  Service: Plastics;  Laterality: Left;   APPLICATION OF WOUND VAC Left 12/05/2016   Procedure: APPLICATION OF WOUND VAC;  Surgeon: Loel Lofty Dillingham, DO;  Location: [place];  Service: Plastics;  Laterality: Left;   I & D EXTREMITY Left 11/03/2016   Procedure: IRRIGATION AND DEBRIDEMENT EXTREMITY;  Surgeon: Leandrew Koyanagi, MD;  Location: [place];  Service: Orthopedics;  Laterality: Left;   I & D EXTREMITY Left 12/05/2016   Procedure: IRRIGATION AND DEBRIDEMENT EXTREMITY;  Surgeon: Loel Lofty Dillingham, DO;  Location: [place];  Service: Plastics;  Laterality: Left;   INCISION AND DRAINAGE ABSCESS Right 11/12/2013   Procedure: INCISION AND DRAINAGE AND OPEN PACKING OF RIGHT CALF  ABSCESS;  Surgeon: Earnstine Regal, MD;  Location: [facility];  Service: General;  Laterality: Right;   INCISION AND DRAINAGE OF WOUND Left 01/29/2017   Procedure: IRRIGATION AND DEBRIDEMENT OF LEFT LEG WOUND WITH ABRA PLACEMENT AND PLACEMENT OF WOUND VAC;  Surgeon: Wallace Going, DO;  Location: [facility];  Service: Plastics;  Laterality: Left;   KNEE SURGERY  Family History  Problem Relation Age of Onset   Heart disease Mother    Stroke Mother    Heart disease Father    Stroke Father     Social History   Tobacco Use   Smoking status: Former    Packs/day: 1.00    Years: 32.00    Pack years: 32.00    Types: Cigarettes    Start date: 1968    Quit date: 2002    Years since quitting: 20.4   Smokeless tobacco: Never  Vaping Use   Vaping Use: Never used  Substance Use Topics   Alcohol use: No    Alcohol/week: 0.0 standard drinks   Drug use: Not Currently    Types: Heroin, Cocaine    Comment: " relapsed  once this year " 2018    Home Medications Prior to Admission medications   Medication Sig Start Date End Date Taking? Authorizing Provider  albuterol (VENTOLIN HFA) 108 (90 Base) MCG/ACT inhaler INHALE 2 PUFFS EVERY 4-6 HOURS 05/25/20 05/25/21  Kristen Loader, FNP  albuterol (VENTOLIN HFA) 108 (90 Base) MCG/ACT inhaler Inhale 2 puffs into the lungs every 4-6 hours as needed 01/11/21     alprazolam (XANAX) 2 MG tablet Take 1 mg by mouth 3 (three) times daily as needed for sleep or anxiety. 06/21/20   [provider]  Amantadine HCl 100 MG tablet TAKE 1/2 TO 1 TABLET BY MOUTH 2 TIMES DAILY 10/25/20 10/25/21  Pearson Grippe, MD  ARIPiprazole (ABILIFY) 10 MG tablet TAKE 1 TABLET BY MOUTH EVERY MORNING 10/25/20 10/25/21  Pearson Grippe, MD  aspirin EC 81 MG EC tablet Take 1 tablet (81 mg total) by mouth daily. 01/07/20   Metzger-Cihelka, York Cerise, NP  atorvastatin (LIPITOR) 40 MG tablet TAKE 1 TABLET BY MOUTH ONCE DAILY 01/19/20 01/18/21  Nahser, Wonda Cheng, MD  clonazePAM (KLONOPIN) 0.5 MG tablet TAKE 1 TABLET BY MOUTH 2 TIMES DAILY AS NEEDED 10/25/20 04/23/21  Pearson Grippe, MD  FLUoxetine (PROZAC) 10 MG capsule TAKE 1 CAPSULE BY MOUTH EVERY MORNING 10/25/20 10/25/21  Pearson Grippe, MD  fluticasone [facility]) 50 MCG/ACT nasal spray USE 1 SPRAY IN EACH NOSTRIL ONCE DAILY 05/25/20 05/25/21  Kristen Loader, FNP  ipratropium-albuterol (DUONEB) 0.5-2.5 (3) MG/3ML SOLN Take 3 mLs by nebulization every 4 (four) hours as needed (sob).    [provider]  lamoTRIgine (LAMICTAL) 200 MG tablet TAKE 1 TABLET BY MOUTH EVERY MORNING 10/25/20 10/25/21  Pearson Grippe, MD  losartan (COZAAR) 50 MG tablet TAKE 1 TABLET (50 MG TOTAL) BY MOUTH DAILY. 12/04/20 12/04/21  Richardson Dopp T, PA-C  metoprolol succinate (TOPROL-XL) 25 MG 24 hr tablet TAKE 1 TABLET BY MOUTH ONCE A DAY 11/01/20 11/01/21  Richardson Dopp T, PA-C  Multiple Vitamins-Minerals (MULTIVITAMIN WITH MINERALS) tablet Take 1 tablet by mouth daily.    [provider]  Oxycodone HCl 20 MG TABS TAKE 1 TABLET BY MOUTH 3 TIMES DAILY AS NEEDED FOR PAIN * FOLLOW UP WITH DR [facility] [place] 2022 11/21/20 05/20/21  Berle Mull, MD  Oxycodone HCl 20 MG TABS Take 1 tablet by mouth 3 times a day as needed for pain. (monthly contract   Follow up with Dr Alfonso Ramus in July 2022) 02/13/21     polyethylene glycol (MIRALAX / GLYCOLAX) 17 g packet Take 17 g by mouth daily as needed for mild constipation.  Patient not taking: Reported on 01/08/2021    [provider]  predniSONE (DELTASONE) 10 MG tablet Take 4 tablets by mouth x1  day-- 3 tabs x1 day-- 2 tabs x1 day-- 1 tab x1 day--1/2 tab x1 day. 01/30/21   Brand Males, MD  tamsulosin (FLOMAX) 0.4 MG CAPS capsule TAKE 1 CAPSULE BY MOUTH AT BEDTIME 09/05/20 09/05/21  Festus Aloe, MD  tamsulosin (FLOMAX) 0.4 MG CAPS capsule Take 1 capsule (0.4 mg total) by mouth at bedtime. 01/24/21     topiramate (TOPAMAX) 25 MG tablet TAKE 1 TABLET BY MOUTH ONCE DAILY 01/08/21 01/08/22  Frann Rider, NP  umeclidinium-vilanterol (ANORO ELLIPTA) 62.5-25 MCG/INH AEPB Inhale 1 puff into the lungs daily. 01/25/21 09/22/21  Brand Males, MD  amiodarone (PACERONE) 100 MG tablet TAKE 1 TABLET BY MOUTH DAILY 11/02/20 12/04/20  Nahser, Wonda Cheng, MD  carvedilol (COREG) 3.125 MG tablet Take 1 tablet (3.125 mg total) by mouth 2 (two) times daily with a meal. 01/25/20 11/01/20  Nahser, Wonda Cheng, MD    Allergies    Propoxyphene, Amoxicillin, Ciprofloxacin, and Depakote [divalproex sodium]  Review of Systems   Review of Systems  Constitutional:  Positive for activity change, chills, fatigue and fever. Negative for appetite change.  HENT: Negative.    Eyes: Negative.   Respiratory: Negative.    Cardiovascular: Negative.   Gastrointestinal: Negative.  Negative for diarrhea, nausea and vomiting.  Genitourinary: Negative.   Musculoskeletal:  Positive for back pain.  Skin: Negative.   Allergic/Immunologic: Negative.   Hematological:  Negative.   Psychiatric/Behavioral: Negative.    All other systems reviewed and are negative.  Physical Exam Updated Vital Signs BP (!) 149/89 (BP Location: Right Arm)   Pulse (!) 103   Temp (!) 103.2 F (39.6 C) (Oral)   Resp 19   Ht 1.778 m (5\' 10" )   Wt 70.3 kg   SpO2 92%   BMI 22.24 kg/m   Physical Exam Vitals and nursing note reviewed.  Constitutional:      General: He is not in acute distress.    Appearance: He is ill-appearing.     Comments: Somewhat chronically ill-appearing man who appears to be uncomfortable  HENT:     Head: Normocephalic.     Right Ear: External ear normal.     Left Ear: External ear normal.     Nose: Nose normal.     Mouth/Throat:     Mouth: Mucous membranes are dry.  Eyes:     Pupils: Pupils are equal, round, and reactive to light.  Cardiovascular:     Rate and Rhythm: Regular rhythm. Tachycardia present.     Pulses: Normal pulses.  Pulmonary:     Effort: Pulmonary effort is normal.  Abdominal:     General: Abdomen is flat.     Palpations: Abdomen is soft.  Musculoskeletal:        General: Normal range of motion.     Cervical back: Normal range of motion.  Skin:    General: Skin is warm.     Capillary Refill: Capillary refill takes less than 2 seconds.  Neurological:     General: No focal deficit present.     Mental Status: He is alert.  Psychiatric:        Mood and Affect: Mood normal.    ED Results / Procedures / Treatments   Labs (all labs ordered are listed, but only abnormal results are displayed) Labs Reviewed  URINE CULTURE  CULTURE, BLOOD (ROUTINE X 2)  CULTURE, BLOOD (ROUTINE X 2)  LACTIC ACID, PLASMA  LACTIC ACID, PLASMA  COMPREHENSIVE METABOLIC PANEL  CBC WITH DIFFERENTIAL/PLATELET  PROTIME-INR  APTT  URINALYSIS, ROUTINE W REFLEX MICROSCOPIC    EKG None  Radiology DG Chest Port 1 View  Result Date: 02/16/2021 CLINICAL DATA:  Questionable sepsis.  Mid upper back pain. EXAM: PORTABLE CHEST 1 VIEW  COMPARISON:  Rib x-Latiana Tomei June 23, 2020.  Chest CT June 23, 2020. FINDINGS: There is new infiltrate in the right upper lobe compared to previous studies. Emphysematous changes are seen in the lungs. The heart, hila, mediastinum are unremarkable. No pneumothorax. No other acute abnormalities. IMPRESSION: New infiltrate in the right upper lobe worrisome for pneumonia given history. Recommend short-term follow-up imaging after treatment to ensure resolution. Electronically Signed   By: Dorise Bullion III M.D   On: 02/16/2021 13:58    Procedures .Critical Care  Date/Time: 02/16/2021 3:29 PM Performed by: Pattricia Boss, MD Authorized by: Pattricia Boss, MD   Critical care provider statement:    Critical care time (minutes):  45   Critical care end time:  02/16/2021 3:29 PM   Critical care was time spent personally by me on the following activities:  Discussions with consultants, evaluation of patient's response to treatment, examination of patient, ordering and performing treatments and interventions, ordering and review of laboratory studies, ordering and review of radiographic studies, pulse oximetry, re-evaluation of patient's condition, obtaining history from patient or surrogate and review of old charts   Medications Ordered in ED Medications  acetaminophen (TYLENOL) tablet 650 mg (has no administration in time range)  ibuprofen (ADVIL) tablet 800 mg (has no administration in time range)    ED Course  I have reviewed the triage vital signs and the nursing notes.  Pertinent labs & imaging results that were available during my care of the patient were reviewed by me and considered in my medical decision making (see chart for details).    MDM Rules/Calculators/A&P                          70 year old man with history of COPD, stroke, polysubstance abuse and IV drug abuse, COVID diagnosis May 16 with antiviral oral therapy followed by steroids and reportedly was better until last night.   Presents today with fever to 103, tach cardiac, and right upper back pain  Evaluation here reveals leukocytosis to 18,900, creatinine increased to 1.6 from 1.04, and chest x-Jaisha Villacres with right upper lobe infiltrate. Patient treated here with broad-spectrum antibiotics, liter of normal saline, and antipyretics.  Patient's temperature has decreased from 10 3-1 01.  His blood pressure is at 135/57.  Initial lactic acid is normal. He has some dyspnea and has been placed on a nasal cannula with sats increased from 92 to 97%. Plan admission for ongoing evaluation and treatment. Discussed with Dr. Eliberto Ivory who has accepted patient for admission. Final Clinical Impression(s) / ED Diagnoses Final diagnoses:  Community acquired pneumonia of right upper lobe of lung    Rx / DC Orders ED Discharge Orders     None        Pattricia Boss, MD 02/16/21 1529

## 2021-02-16 NOTE — ED Notes (Signed)
Pt stated that he took an oxy about 1130 this morning before coming to the hospital

## 2021-02-16 NOTE — Progress Notes (Addendum)
Pharmacy Antibiotic Note  Travis Salinas Travis Salinas is a 70 y.o. male admitted on 02/16/2021 with back pain, found to be febrile and tachycardic. Patient was diagnosed with COVID in May and was treated with steroids recently. Pharmacy has been consulted for vancomycin and cefepime dosing for sepsis.  Tmax 103.67F, WBC elevated to 18.9. Kidney function below baseline with SCr 1.6, CrCl ~46 mL/min. AUC w/ dosing below is 438.   Plan: Give vancomycin 1,000mg  IV load x1 Initiate vancomycin 1,000mg  every 24 hours. Goal AUC 400-550   Initiate cefepime 2g IV every 12 hours Metronidazole per MD Follow up with cultures, antibiotic de-escalation and LOT Monitor renal function and clinical progress  Height: 5\' 10"  (177.8 cm) Weight: 70.3 kg (155 lb) IBW/kg (Calculated) : 73  Temp (24hrs), Avg:103.2 F (39.6 C), Min:103.2 F (39.6 C), Max:103.2 F (39.6 C)  No results for input(s): WBC, CREATININE, LATICACIDVEN, VANCOTROUGH, VANCOPEAK, VANCORANDOM, GENTTROUGH, GENTPEAK, GENTRANDOM, TOBRATROUGH, TOBRAPEAK, TOBRARND, AMIKACINPEAK, AMIKACINTROU, AMIKACIN in the last 168 hours.  CrCl cannot be calculated (Patient's most recent lab result is older than the maximum 21 days allowed.).    Allergies  Allergen Reactions   Propoxyphene Nausea And Vomiting   Amoxicillin Nausea And Vomiting   Ciprofloxacin Nausea Only   Depakote [Divalproex Sodium] Other (See Comments)    hallucinations    Antimicrobials this admission: Cefepime 6/10 >>  Vancomycin 6/10 >>   Microbiology results: 6/10 BCx: sent  6/10 UCx: sent   Thank you for allowing pharmacy to be a part of this patient's care.  Mercy Riding, PharmD PGY1 Acute Care Pharmacy Resident Please refer to [facility] for unit-specific pharmacist

## 2021-02-16 NOTE — Progress Notes (Signed)
   02/16/21 1728  Vitals  Temp 98.4 F (36.9 C)  Temp Source Oral  BP (!) 107/54  MAP (mmHg) 70  BP Location Right Arm  BP Method Automatic  Patient Position (if appropriate) Lying  Pulse Rate 79  ECG Heart Rate 77  Resp 16  MEWS COLOR  MEWS Score Color Green  Oxygen Therapy  SpO2 92 %  O2 Device Room Air  Pain Assessment  Pain Scale 0-10  Pain Score Asleep   Admitted from [place]. [facility] admit paged.

## 2021-02-16 NOTE — ED Triage Notes (Signed)
Pt complains of upper back pain that woke him from sleep. According to hi s ex-wife Pt has had a stroke a year ago the Pt's mental ability

## 2021-02-16 NOTE — H&P (Signed)
History and Physical    Travis Salinas YSA:[id number] DOB: [birth year] DOA: 02/16/2021  PCP: Kathyrn Lass, MD  Patient coming from: [place]  I have personally briefly reviewed patient's old medical records in [place]  Chief Complaint: Upper back pain  HPI: Travis Salinas is a 70 y.o. male with medical history significant for COPD, substance use disorder (cocaine), HTN, history of CVA, depression/anxiety, chronic pain who presented to the ED for evaluation of upper back pain.  Patient states about 3 and a half weeks ago he was diagnosed with COVID-19 and was experiencing URI type symptoms.  Per documentation, he was initially treated with a course of Molnupiravir.  Symptoms persisted and he he was then placed on a prednisone taper.  Patient states he was doing well until earlier today when he began to have upper back pain.  He has been having intermittent shortness of breath and rare cough sometimes productive of brown sputum.  He has had subjective fevers, chills, and diaphoresis.  He has not had any nausea, vomiting, abdominal pain, dysuria.  He reports occasional tobacco use, less than 1 pack/month.  He does admit to cocaine occasional marijuana use.  He denies any significant alcohol use.  [facility] ED Course:  Ischial vital was showed BP 149/89, pulse 103, RR 19, temp 103.2 F, SPO2 92% on room air.  Labs significant for WBC 18.9, hemoglobin 11.5, platelets 251,000, sodium 139, potassium 3.9, bicarb 24, BUN 23, creatinine 1.60 (previously 1.04 on 01/02/2021), serum glucose 118, LFTs within normal limits, INR 1.0, lactic acid 1.4 > 0.9, ammonia 33, serum ethanol 10.  Urinalysis shows negative nitrites, negative leukocytes, 0-5 RBCs and WBCs/hpf, rare bacteria microscopy.  Blood and urine cultures obtained and pending.  UDS positive for cocaine (also positive for opiates and benzodiazepines which is consistent with chronic alprazolam and oxycodone  use as prescribed).  Portable chest x-ray shows new right upper lobe infiltrate.  Patient was given IV vancomycin, cefepime, Flagyl, fentanyl, and 1 L LR.  The hospitalist service was consulted to admit for further evaluation and management.  Review of Systems: All systems reviewed and are negative except as documented in history of present illness above.   Past Medical History:  Diagnosis Date   Arthritis    Cervical disc herniation    COPD (chronic obstructive pulmonary disease) ([place])    Headache    " due to antibiotics"   Hepatitis C    Hypertension     Past Surgical History:  Procedure Laterality Date   APPLICATION OF A-CELL OF EXTREMITY Left 12/05/2016   Procedure: APPLICATION OF A-CELL OF EXTREMITY;  Surgeon: Loel Lofty Dillingham, DO;  Location: [facility];  Service: Plastics;  Laterality: Left;   APPLICATION OF WOUND VAC Left 12/05/2016   Procedure: APPLICATION OF WOUND VAC;  Surgeon: Loel Lofty Dillingham, DO;  Location: [facility];  Service: Plastics;  Laterality: Left;   I & D EXTREMITY Left 11/03/2016   Procedure: IRRIGATION AND DEBRIDEMENT EXTREMITY;  Surgeon: Leandrew Koyanagi, MD;  Location: [facility];  Service: Orthopedics;  Laterality: Left;   I & D EXTREMITY Left 12/05/2016   Procedure: IRRIGATION AND DEBRIDEMENT EXTREMITY;  Surgeon: Loel Lofty Dillingham, DO;  Location: [facility];  Service: Plastics;  Laterality: Left;   INCISION AND DRAINAGE ABSCESS Right 11/12/2013   Procedure: INCISION AND DRAINAGE AND OPEN PACKING OF RIGHT CALF  ABSCESS;  Surgeon: Earnstine Regal, MD;  [facility];  Service: General;  Laterality:  Right;   INCISION AND DRAINAGE OF WOUND Left 01/29/2017   Procedure: IRRIGATION AND DEBRIDEMENT OF LEFT LEG WOUND WITH ABRA PLACEMENT AND PLACEMENT OF WOUND VAC;  Surgeon: Wallace Going, DO;  Location: [facility];  Service: Plastics;  Laterality: Left;   KNEE SURGERY      Social History:  reports that he quit smoking about 20 years ago. His smoking use included cigarettes. He  started smoking about 54 years ago. He has a 32.00 pack-year smoking history. He has never used smokeless tobacco. He reports previous drug use. Drugs: Heroin and Cocaine. He reports that he does not drink alcohol.  Allergies  Allergen Reactions   Propoxyphene Nausea And Vomiting   Ciprofloxacin Nausea Only   Depakote [Divalproex Sodium] Other (See Comments)    hallucinations   Amoxicillin Nausea And Vomiting    Tolerated cefazolin    Family History  Problem Relation Age of Onset   Heart disease Mother    Stroke Mother    Heart disease Father    Stroke Father      Prior to Admission medications   Medication Sig Start Date End Date Taking? Authorizing Provider  albuterol (VENTOLIN HFA) 108 (90 Base) MCG/ACT inhaler INHALE 2 PUFFS EVERY 4-6 HOURS 05/25/20 05/25/21 Yes Kristen Loader, FNP  albuterol (VENTOLIN HFA) 108 (90 Base) MCG/ACT inhaler Inhale 2 puffs into the lungs every 4-6 hours as needed 01/11/21  Yes   alprazolam (XANAX) 2 MG tablet Take 1 mg by mouth 3 (three) times daily as needed for sleep or anxiety. 06/21/20  Yes [provider]  ARIPiprazole (ABILIFY) 10 MG tablet TAKE 1 TABLET BY MOUTH EVERY MORNING 10/25/20 10/25/21 Yes Pearson Grippe, MD  aspirin EC 81 MG EC tablet Take 1 tablet (81 mg total) by mouth daily. 01/07/20  Yes Metzger-Cihelka, Desiree, NP  clonazePAM (KLONOPIN) 0.5 MG tablet TAKE 1 TABLET BY MOUTH 2 TIMES DAILY AS NEEDED 10/25/20 04/23/21 Yes Pearson Grippe, MD  FLUoxetine (PROZAC) 10 MG capsule TAKE 1 CAPSULE BY MOUTH EVERY MORNING 10/25/20 10/25/21 Yes Pearson Grippe, MD  fluticasone [facility]) 50 MCG/ACT nasal spray USE 1 SPRAY IN EACH NOSTRIL ONCE DAILY 05/25/20 05/25/21 Yes Kristen Loader, FNP  lamoTRIgine (LAMICTAL) 200 MG tablet TAKE 1 TABLET BY MOUTH EVERY MORNING 10/25/20 10/25/21 Yes Pearson Grippe, MD  losartan (COZAAR) 50 MG tablet TAKE 1 TABLET (50 MG TOTAL) BY MOUTH DAILY. 12/04/20 12/04/21 Yes Weaver, Scott T, PA-C  metoprolol succinate (TOPROL-XL)  25 MG 24 hr tablet TAKE 1 TABLET BY MOUTH ONCE A DAY 11/01/20 11/01/21 Yes Weaver, Scott T, PA-C  Multiple Vitamins-Minerals (MULTIVITAMIN WITH MINERALS) tablet Take 1 tablet by mouth daily.   Yes [provider]  Oxycodone HCl 20 MG TABS TAKE 1 TABLET BY MOUTH 3 TIMES DAILY AS NEEDED FOR PAIN * FOLLOW UP WITH DR [facility] IN [place] 2022 11/21/20 05/20/21 Yes Berle Mull, MD  predniSONE (DELTASONE) 10 MG tablet Take 4 tablets by mouth x1 day-- 3 tabs x1 day-- 2 tabs x1 day-- 1 tab x1 day--1/2 tab x1 day. 01/30/21  Yes Brand Males, MD  tamsulosin (FLOMAX) 0.4 MG CAPS capsule TAKE 1 CAPSULE BY MOUTH AT BEDTIME 09/05/20 09/05/21 Yes Festus Aloe, MD  topiramate (TOPAMAX) 25 MG tablet TAKE 1 TABLET BY MOUTH ONCE DAILY 01/08/21 01/08/22 Yes McCue, Janett Billow, NP  umeclidinium-vilanterol (ANORO ELLIPTA) 62.5-25 MCG/INH AEPB Inhale 1 puff into the lungs daily. 01/25/21 09/22/21 Yes Brand Males, MD  Amantadine HCl 100 MG tablet TAKE 1/2 TO 1 TABLET BY MOUTH  2 TIMES DAILY 10/25/20 10/25/21  Pearson Grippe, MD  atorvastatin (LIPITOR) 40 MG tablet TAKE 1 TABLET BY MOUTH ONCE DAILY 01/19/20 01/18/21  Nahser, Wonda Cheng, MD  ipratropium-albuterol (DUONEB) 0.5-2.5 (3) MG/3ML SOLN Take 3 mLs by nebulization every 4 (four) hours as needed (sob).    [provider]  Oxycodone HCl 20 MG TABS Take 1 tablet by mouth 3 times a day as needed for pain. (monthly contract   Follow up with Dr Alfonso Ramus in July 2022) 02/13/21     polyethylene glycol (MIRALAX / GLYCOLAX) 17 g packet Take 17 g by mouth daily as needed for mild constipation.  Patient not taking: Reported on 01/08/2021    [provider]  tamsulosin (FLOMAX) 0.4 MG CAPS capsule Take 1 capsule (0.4 mg total) by mouth at bedtime. 01/24/21     amiodarone (PACERONE) 100 MG tablet TAKE 1 TABLET BY MOUTH DAILY 11/02/20 12/04/20  Nahser, Wonda Cheng, MD  carvedilol (COREG) 3.125 MG tablet Take 1 tablet (3.125 mg total) by mouth 2 (two) times daily with a meal.  01/25/20 11/01/20  Nahser, Wonda Cheng, MD    Physical Exam: Vitals:   02/16/21 1420 02/16/21 1430 02/16/21 1500 02/16/21 1728  BP: (!) 135/57 (!) 131/57 131/60 (!) 107/54  Pulse: 96 95 91 79  Resp: 20 14 14 16   Temp:    98.4 F (36.9 C)  TempSrc:    Oral  SpO2: 93% 93% 93% 92%  Weight:      Height:       Constitutional: Resting supine in bed, NAD, calm, comfortable Eyes: PERRL, lids and conjunctivae normal ENMT: Mucous membranes are dry. Posterior pharynx clear of any exudate or lesions.Normal dentition.  Neck: normal, supple, no masses. Respiratory: Distant breath sounds, clear to auscultation bilaterally, no wheezing, no crackles. Normal respiratory effort. No accessory muscle use.  Cardiovascular: Regular rate and rhythm, no murmurs / rubs / gallops. No extremity edema. 2+ pedal pulses. Abdomen: no tenderness, no masses palpated. No hepatosplenomegaly. Bowel sounds positive.  Musculoskeletal: no clubbing / cyanosis. No joint deformity upper and lower extremities. Good ROM, no contractures. Normal muscle tone.  Skin: no rashes, lesions, ulcers. No induration Neurologic: CN 2-12 grossly intact. Sensation intact. Strength 5/5 in all 4.  Psychiatric: Normal judgment and insight. Alert and oriented x 3. Normal mood.   Labs on Admission: I have personally reviewed following labs and imaging studies  CBC: Recent Labs  Lab 02/16/21 1237  WBC 18.9*  NEUTROABS 16.2*  HGB 11.5*  HCT 37.4*  MCV 92.8  PLT 591   Basic Metabolic Panel: Recent Labs  Lab 02/16/21 1237  NA 139  K 3.9  CL 103  CO2 24  GLUCOSE 118*  BUN 23  CREATININE 1.60*  CALCIUM 9.1   GFR: Estimated Creatinine Clearance: 45.6 mL/min (A) (by C-G formula based on SCr of 1.6 mg/dL (H)). Liver Function Tests: Recent Labs  Lab 02/16/21 1237  AST 25  ALT 19  ALKPHOS 90  BILITOT 0.9  PROT 7.0  ALBUMIN 3.8   No results for input(s): LIPASE, AMYLASE in the last 168 hours. Recent Labs  Lab 02/16/21 1456   AMMONIA 33   Coagulation Profile: Recent Labs  Lab 02/16/21 1237  INR 1.0   Cardiac Enzymes: No results for input(s): CKTOTAL, CKMB, CKMBINDEX, TROPONINI in the last 168 hours. BNP (last 3 results) No results for input(s): PROBNP in the last 8760 hours. HbA1C: No results for input(s): HGBA1C in the last 72 hours. CBG: No results  for input(s): GLUCAP in the last 168 hours. Lipid Profile: No results for input(s): CHOL, HDL, LDLCALC, TRIG, CHOLHDL, LDLDIRECT in the last 72 hours. Thyroid Function Tests: No results for input(s): TSH, T4TOTAL, FREET4, T3FREE, THYROIDAB in the last 72 hours. Anemia Panel: No results for input(s): VITAMINB12, FOLATE, FERRITIN, TIBC, IRON, RETICCTPCT in the last 72 hours. Urine analysis:    Component Value Date/Time   COLORURINE YELLOW 02/16/2021 [place] 02/16/2021 1445   LABSPEC 1.016 02/16/2021 1445   PHURINE 5.5 02/16/2021 1445   GLUCOSEU NEGATIVE 02/16/2021 1445   HGBUR NEGATIVE 02/16/2021 1445   BILIRUBINUR NEGATIVE 02/16/2021 1445   KETONESUR NEGATIVE 02/16/2021 1445   PROTEINUR 30 (A) 02/16/2021 1445   UROBILINOGEN 1.0 11/14/2013 1424   NITRITE NEGATIVE 02/16/2021 1445   LEUKOCYTESUR NEGATIVE 02/16/2021 1445    Radiological Exams on Admission: DG Chest Port 1 View  Result Date: 02/16/2021 CLINICAL DATA:  Questionable sepsis.  Mid upper back pain. EXAM: PORTABLE CHEST 1 VIEW COMPARISON:  Rib x-ray June 23, 2020.  Chest CT June 23, 2020. FINDINGS: There is new infiltrate in the right upper lobe compared to previous studies. Emphysematous changes are seen in the lungs. The heart, hila, mediastinum are unremarkable. No pneumothorax. No other acute abnormalities. IMPRESSION: New infiltrate in the right upper lobe worrisome for pneumonia given history. Recommend short-term follow-up imaging after treatment to ensure resolution. Electronically Signed   By: Dorise Bullion III M.D   On: 02/16/2021 13:58    EKG: Personally  reviewed. Sinus tachycardia, rate 105.  Rate is faster when compared to prior.  Assessment/Plan Principal Problem:   Severe sepsis with acute organ dysfunction (HCC) Active Problems:   HTN (hypertension)   Depression with anxiety   Substance abuse ([place])   COPD (chronic obstructive pulmonary disease) (HCC)   AKI (acute kidney injury) ([place])   Right upper lobe pneumonia   History of CVA (cerebrovascular accident)   Demaryius Imran is a 70 y.o. male with medical history significant for COPD, substance use disorder (cocaine), HTN, history of CVA, depression/anxiety, chronic pain who is admitted with severe sepsis due to right upper lobe pneumonia.  Severe sepsis due to right upper lobe pneumonia: Presenting with leukocytosis, tachycardia, fever, AKI, and evidence of RUL pneumonia on CXR.  Recent COVID-19 infection about 3 weeks ago treated with molnupiravir and prednisone taper. -Narrow antibiotics to ceftriaxone and azithromycin -Follow blood cultures -Check strep pneumonia and Legionella urinary antigens -Continue IV fluid hydration overnight  Acute kidney injury: Secondary to sepsis.  Continue IV fluids overnight.  Hold home losartan.  COPD: Stable without evidence of acute exacerbation.  Continue Anora Ellipta and DuoNeb as needed.  History of CVA: Continue aspirin and atorvastatin.  Hypertension: Holding home losartan and Toprol-XL for now.  Depression/anxiety: Continue home Abilify, Prozac, Lamictal, topiramate.  Continue home alprazolam 3 times daily as needed with hold parameters.  Chronic pain: Continue home oxycodone 20 mg 3 times daily as needed with hold parameters.  [place] substance database is reviewed and shows that he is filling oxycodone and alprazolam consistently.  Substance use disorder: UDS positive for cocaine.  Patient states he does not want his wife notified regarding his substance use.  DVT prophylaxis: Lovenox Code Status: Full code, confirmed with  patient Family Communication: Discussed with patient Disposition Plan: From home and likely discharge to home pending clinical progress Consults called: None Level of care: Med-Surg Admission status:  Status is: Inpatient  Remains inpatient appropriate because:IV treatments appropriate due  to intensity of illness or inability to take PO and Inpatient level of care appropriate due to severity of illness  Dispo: The patient is from: Home              Anticipated d/c is to: Home              Patient currently is not medically stable to d/c.   Difficult to place patient No  Zada Finders MD [facility]  If 7PM-7AM, please contact night-coverage [email or web address]  02/16/2021, 7:33 PM

## 2021-02-16 NOTE — ED Notes (Signed)
Pt's ex-wife is at bedside.Pt agrees that she will be his representative as his brother is in [place]. The password "Blue" has been setup and will be reported to receiving RN.pts exwife is [employer].

## 2021-02-16 NOTE — ED Notes (Signed)
Report called to RN Kris Mouton on [address] [place]

## 2021-02-16 NOTE — ED Notes (Signed)
Pt had positive COVID diagnosis in May (16th). Pt was treated with oral antiviral and prednisone.

## 2021-02-17 ENCOUNTER — Inpatient Hospital Stay (HOSPITAL_COMMUNITY): Payer: Medicare Other

## 2021-02-17 LAB — CBC
HCT: 34.5 % — ABNORMAL LOW (ref 39.0–52.0)
Hemoglobin: 10.6 g/dL — ABNORMAL LOW (ref 13.0–17.0)
MCH: 28.9 pg (ref 26.0–34.0)
MCHC: 30.7 g/dL (ref 30.0–36.0)
MCV: 94 fL (ref 80.0–100.0)
Platelets: 221 10*3/uL (ref 150–400)
RBC: 3.67 MIL/uL — ABNORMAL LOW (ref 4.22–5.81)
RDW: 13.2 % (ref 11.5–15.5)
WBC: 29.1 10*3/uL — ABNORMAL HIGH (ref 4.0–10.5)
nRBC: 0 % (ref 0.0–0.2)

## 2021-02-17 LAB — BASIC METABOLIC PANEL
Anion gap: 10 (ref 5–15)
BUN: 18 mg/dL (ref 8–23)
CO2: 22 mmol/L (ref 22–32)
Calcium: 8.3 mg/dL — ABNORMAL LOW (ref 8.9–10.3)
Chloride: 107 mmol/L (ref 98–111)
Creatinine, Ser: 1.32 mg/dL — ABNORMAL HIGH (ref 0.61–1.24)
GFR, Estimated: 59 mL/min — ABNORMAL LOW (ref >60)
Glucose, Bld: 98 mg/dL (ref 70–99)
Potassium: 3.9 mmol/L (ref 3.5–5.1)
Sodium: 139 mmol/L (ref 135–145)

## 2021-02-17 LAB — URINE CULTURE: Culture: NO GROWTH

## 2021-02-17 LAB — MAGNESIUM: Magnesium: 2 mg/dL (ref 1.7–2.4)

## 2021-02-17 LAB — C-REACTIVE PROTEIN: CRP: 23.1 mg/dL — ABNORMAL HIGH (ref <1.0)

## 2021-02-17 LAB — MRSA NEXT GEN BY PCR, NASAL: MRSA by PCR Next Gen: NOT DETECTED

## 2021-02-17 LAB — PROCALCITONIN: Procalcitonin: 0.59 ng/mL

## 2021-02-17 LAB — BRAIN NATRIURETIC PEPTIDE: B Natriuretic Peptide: 494.4 pg/mL — ABNORMAL HIGH (ref 0.0–100.0)

## 2021-02-17 LAB — D-DIMER, QUANTITATIVE: D-Dimer, Quant: 0.85 ug/mL-FEU — ABNORMAL HIGH (ref 0.00–0.50)

## 2021-02-17 MED ORDER — LACTATED RINGERS IV SOLN: INTRAVENOUS | Status: DC

## 2021-02-17 MED ORDER — MORPHINE SULFATE (PF) 2 MG/ML IV SOLN
1.0000 mg | Freq: Once | INTRAVENOUS | Status: AC | PRN
Start: 2021-02-17 — End: 2021-02-17
  Administered 2021-02-17: 1 mg via INTRAVENOUS
  Filled 2021-02-17: qty 1

## 2021-02-17 NOTE — Evaluation (Signed)
Clinical/Bedside Swallow Evaluation Patient Details  Name: Abenezer Odonell MRN: [id number] Date of Birth: [birth year]  Today's Date: 02/17/2021 Time: SLP Start Time (ACUTE ONLY): 64 SLP Stop Time (ACUTE ONLY): 1230 SLP Time Calculation (min) (ACUTE ONLY): 14 min  Past Medical History:  Past Medical History:  Diagnosis Date   Arthritis    Cervical disc herniation    COPD (chronic obstructive pulmonary disease) ([place])    Headache    " due to antibiotics"   Hepatitis C    Hypertension    Past Surgical History:  Past Surgical History:  Procedure Laterality Date   APPLICATION OF A-CELL OF EXTREMITY Left 12/05/2016   Procedure: APPLICATION OF A-CELL OF EXTREMITY;  Surgeon: Loel Lofty Dillingham, DO;  Location: [place];  Service: Plastics;  Laterality: Left;   APPLICATION OF WOUND VAC Left 12/05/2016   Procedure: APPLICATION OF WOUND VAC;  Surgeon: Loel Lofty Dillingham, DO;  Location: [facility];  Service: Plastics;  Laterality: Left;   I & D EXTREMITY Left 11/03/2016   Procedure: IRRIGATION AND DEBRIDEMENT EXTREMITY;  Surgeon: Leandrew Koyanagi, MD;  Location: [facility];  Service: Orthopedics;  Laterality: Left;   I & D EXTREMITY Left 12/05/2016   Procedure: IRRIGATION AND DEBRIDEMENT EXTREMITY;  Surgeon: Loel Lofty Dillingham, DO;  Location: [facility];  Service: Plastics;  Laterality: Left;   INCISION AND DRAINAGE ABSCESS Right 11/12/2013   Procedure: INCISION AND DRAINAGE AND OPEN PACKING OF RIGHT CALF  ABSCESS;  Surgeon: Earnstine Regal, MD;  [facility];  Service: General;  Laterality: Right;   INCISION AND DRAINAGE OF WOUND Left 01/29/2017   Procedure: IRRIGATION AND DEBRIDEMENT OF LEFT LEG WOUND WITH ABRA PLACEMENT AND PLACEMENT OF WOUND VAC;  Surgeon: Wallace Going, DO;  [facility];  Service: Plastics;  Laterality: Left;   KNEE SURGERY     HPI:  Joshva Labreck is a 70 y.o. male with medical history significant for COPD, substance use disorder (cocaine), HTN, history of CVA,  depression/anxiety, chronic pain who presented to the ED for evaluation of upper back pain, he had Covid 3.5 weeks ago, he was diagnosed now with RUL Pneumonia and admitted.   Assessment / Plan / Recommendation Clinical Impression  Patient presents with what appears to be normal oropharyngeal swallowing function without overt indication of aspiration. Denies swallowing difficulty in the past. Patient is lethargic, falling asleep quickly after being aroused which does increase aspiration risk. Recommend pos only when fully alert. No further SLP f/u indicated at this time, . SLP Visit Diagnosis: Dysphagia, unspecified (R13.10)    Aspiration Risk  Mild aspiration risk    Diet Recommendation Regular;Thin liquid   Liquid Administration via: Straw;Cup Medication Administration: Whole meds with liquid Supervision: Patient able to self feed Compensations: Small sips/bites Postural Changes: Seated upright at 90 degrees    Other  Recommendations Oral Care Recommendations: Oral care BID   Follow up Recommendations None        Swallow Study   General HPI: Mukesh Kornegay is a 70 y.o. male with medical history significant for COPD, substance use disorder (cocaine), HTN, history of CVA, depression/anxiety, chronic pain who presented to the ED for evaluation of upper back pain, he had Covid 3.5 weeks ago, he was diagnosed now with RUL Pneumonia and admitted. Type of Study: Bedside Swallow Evaluation Previous Swallow Assessment: FE[facility] no record found Diet Prior to this Study: Regular;Thin liquids Temperature Spikes Noted: No Respiratory Status:  Nasal cannula History of Recent Intubation: No Behavior/Cognition: Cooperative;Pleasant mood;Lethargic/Drowsy;Requires cueing Oral Cavity Assessment: Within Functional Limits Oral Care Completed by SLP: No Oral Cavity - Dentition: Adequate natural dentition Vision: Functional for self-feeding Self-Feeding  Abilities: Able to feed self Patient Positioning: Upright in bed Baseline Vocal Quality: Normal Volitional Cough: Strong Volitional Swallow: Able to elicit    Oral/Motor/Sensory Function Overall Oral Motor/Sensory Function: Within functional limits   Ice Chips Ice chips: Not tested   Thin Liquid Thin Liquid: Within functional limits Presentation: Cup;Self Fed;Straw    Nectar Thick Nectar Thick Liquid: Not tested   Honey Thick Honey Thick Liquid: Not tested   Puree Puree: Within functional limits Presentation: Self Fed;Spoon   Solid     Solid: Within functional limits Presentation: Richardson MA, CCC-SLP  Francile Woolford Meryl 02/17/2021,12:36 PM

## 2021-02-17 NOTE — Progress Notes (Signed)
PROGRESS NOTE                                                                                                                                                                                                             Patient Demographics:    Travis Salinas, is a 70 y.o. male, DOB - [birth year], WHQ:[id number]  Outpatient Primary MD for the patient is Kathyrn Lass, MD    LOS - 1  Admit date - 02/16/2021    Chief Complaint  Patient presents with   Back Pain       Brief Narrative (HPI from H&P) - Travis Salinas is a 70 y.o. male with medical history significant for COPD, substance use disorder (cocaine), HTN, history of CVA, depression/anxiety, chronic pain who presented to the ED for evaluation of upper back pain, he had Covid 3.5 weeks ago, he was diagnosed now with RUL Pneumonia and admitted.    Subjective:    Travis Salinas today has, No headache, No chest pain, No abdominal pain - No Nausea, No new weakness tingling or numbness, +ve cough and mild SOB.   Assessment  & Plan :     Severe sepsis due to right upper lobe pneumonia - Sepsis physiology has resolved, continue IVF and IV ABX, monitor cultures, SLP eval.   Acute kidney injury: Secondary to sepsis. Hold home losartan, AKI almost resolved.   COPD: Stable without evidence of acute exacerbation.  Continue Anora Ellipta and DuoNeb as needed.   History of CVA: Continue aspirin and atorvastatin.   Hypertension: Holding home losartan and Toprol-XL for now. Continue IVF.   Depression/anxiety: Continue home Abilify, Prozac, Lamictal, topiramate.  Continue home alprazolam 3 times daily as needed with hold parameters.   Chronic pain: Continue home oxycodone 20 mg 3 times daily as needed with hold parameters.  [place] substance database is reviewed and shows that he is filling oxycodone and alprazolam consistently.   Substance use disorder : UDS positive for  cocaine.  Patient states he does not want his wife notified regarding his substance use.  BPH - flomax.      Condition - Extremely Guarded  Family Communication  :  None  Code Status :  Fair  Consults  :  None  PUD Prophylaxis :    Procedures  :  Disposition Plan  :    Status is: Inpatient  Remains inpatient appropriate because:IV treatments appropriate due to intensity of illness or inability to take PO  Dispo: The patient is from: Home              Anticipated d/c is to: Home              Patient currently is not medically stable to d/c.   Difficult to place patient No  DVT Prophylaxis  :    enoxaparin (LOVENOX) injection 40 mg Start: 02/16/21 2100   Lab Results  Component Value Date   PLT 221 02/17/2021    Diet :  Diet Order             Diet Heart Room service appropriate? Yes; Fluid consistency: Thin  Diet effective now                    Inpatient Medications  Scheduled Meds:  ARIPiprazole  10 mg Oral Daily   aspirin EC  81 mg Oral Daily   atorvastatin  40 mg Oral Daily   enoxaparin (LOVENOX) injection  40 mg Subcutaneous Q24H   FLUoxetine  10 mg Oral Daily   fluticasone  1 spray Each Nare Daily   guaiFENesin  600 mg Oral BID   lamoTRIgine  200 mg Oral Daily   tamsulosin  0.4 mg Oral QHS   topiramate  25 mg Oral Daily   umeclidinium-vilanterol  1 puff Inhalation Daily   Continuous Infusions:  sodium chloride 250 mL (02/16/21 1312)   sodium chloride 250 mL (02/16/21 1414)   azithromycin 500 mg (02/16/21 2038)   cefTRIAXone (ROCEPHIN)  IV 2 g (02/16/21 2037)   PRN Meds:.sodium chloride, sodium chloride, acetaminophen **OR** acetaminophen, alprazolam, ipratropium-albuterol, ondansetron **OR** ondansetron (ZOFRAN) IV, oxyCODONE, polyethylene glycol, senna-docusate  Antibiotics  :    Anti-infectives (From admission, onward)    Start     Dose/Rate Route Frequency Ordered Stop   02/17/21 1000  vancomycin (VANCOCIN) IVPB 1000  mg/200 mL premix  Status:  Discontinued        1,000 mg 200 mL/hr over 60 Minutes Intravenous Every 24 hours 02/16/21 1408 02/16/21 1928   02/17/21 0100  ceFEPIme (MAXIPIME) 2 g in sodium chloride 0.9 % 100 mL IVPB  Status:  Discontinued        2 g 200 mL/hr over 30 Minutes Intravenous Every 12 hours 02/16/21 1408 02/16/21 1928   02/16/21 2015  cefTRIAXone (ROCEPHIN) 2 g in sodium chloride 0.9 % 100 mL IVPB        2 g 200 mL/hr over 30 Minutes Intravenous Every 24 hours 02/16/21 1928     02/16/21 2015  azithromycin (ZITHROMAX) 500 mg in sodium chloride 0.9 % 250 mL IVPB        500 mg 250 mL/hr over 60 Minutes Intravenous Every 24 hours 02/16/21 1928     02/16/21 1245  ceFEPIme (MAXIPIME) 2 g in sodium chloride 0.9 % 100 mL IVPB        2 g 200 mL/hr over 30 Minutes Intravenous  Once 02/16/21 1243 02/16/21 1505   02/16/21 1245  metroNIDAZOLE (FLAGYL) IVPB 500 mg        500 mg 100 mL/hr over 60 Minutes Intravenous  Once 02/16/21 1244 02/16/21 1452   02/16/21 1245  vancomycin (VANCOCIN) IVPB 1000 mg/200 mL premix        1,000 mg 200 mL/hr over 60 Minutes Intravenous  Once 02/16/21 1244 02/16/21 1516  Time Spent in minutes  30   Lala Lund M.D on 02/17/2021 at 9:50 AM  To page go to [email or web address]   [facility] -  Office  [phone]   See all Orders from today for further details    Objective:   Vitals:   02/17/21 0000 02/17/21 0400 02/17/21 0712 02/17/21 0755  BP: (!) 127/58 120/63 98/66   Pulse: 71 82 84   Resp: 17 18 20    Temp: 98.2 F (36.8 C) 98.4 F (36.9 C) 98.2 F (36.8 C)   TempSrc: Oral Axillary Oral   SpO2: 96% 91% 93% 94%  Weight:      Height:        Wt Readings from Last 3 Encounters:  02/16/21 75 kg  01/09/21 65.8 kg  01/08/21 66.7 kg     Intake/Output Summary (Last 24 hours) at 02/17/2021 0950 Last data filed at 02/17/2021 0932 Gross per 24 hour  Intake 2652.56 ml  Output 1040 ml  Net 1612.56 ml     Physical Exam  Awake  Alert, No new F.N deficits, Normal affect [place].AT,PERRAL Supple Neck,No JVD, No cervical lymphadenopathy appriciated.  Symmetrical Chest wall movement, Good air movement bilaterally, R. Sided rales RRR,No Gallops,Rubs or new Murmurs, No Parasternal Heave +ve B.Sounds, Abd Soft, No tenderness, No organomegaly appriciated, No rebound - guarding or rigidity. No Cyanosis, Clubbing or edema, No new Rash or bruise    RN pressure injury documentation:     Data Review:    CBC Recent Labs  Lab 02/16/21 1237 02/17/21 0025  WBC 18.9* 29.1*  HGB 11.5* 10.6*  HCT 37.4* 34.5*  PLT 251 221  MCV 92.8 94.0  MCH 28.5 28.9  MCHC 30.7 30.7  RDW 13.3 13.2  LYMPHSABS 0.8  --   MONOABS 1.8*  --   EOSABS 0.1  --   BASOSABS 0.0  --     Recent Labs  Lab 02/16/21 1237 02/16/21 1456 02/17/21 0025 02/17/21 0756  NA 139  --  139  --   K 3.9  --  3.9  --   CL 103  --  107  --   CO2 24  --  22  --   GLUCOSE 118*  --  98  --   BUN 23  --  18  --   CREATININE 1.60*  --  1.32*  --   CALCIUM 9.1  --  8.3*  --   AST 25  --   --   --   ALT 19  --   --   --   ALKPHOS 90  --   --   --   BILITOT 0.9  --   --   --   ALBUMIN 3.8  --   --   --   MG  --   --   --  2.0  CRP  --   --   --  23.1*  DDIMER  --   --   --  0.85*  PROCALCITON  --   --  0.59  --   LATICACIDVEN 1.4 0.9  --   --   INR 1.0  --   --   --   AMMONIA  --  33  --   --   BNP  --   --   --  494.4*    ------------------------------------------------------------------------------------------------------------------ No results for input(s): CHOL, HDL, LDLCALC, TRIG, CHOLHDL, LDLDIRECT in the last 72 hours.  Lab Results  Component Value Date   HGBA1C 5.2  01/02/2020   ------------------------------------------------------------------------------------------------------------------ No results for input(s): TSH, T4TOTAL, T3FREE, THYROIDAB in the last 72 hours.  Invalid input(s): FREET3  Cardiac Enzymes No results for input(s):  CKMB, TROPONINI, MYOGLOBIN in the last 168 hours.  Invalid input(s): CK ------------------------------------------------------------------------------------------------------------------    Component Value Date/Time   BNP 494.4 (H) 02/17/2021 0756    Micro Results Recent Results (from the past 240 hour(s))  Blood Culture (routine x 2)     Status: None (Preliminary result)   Collection Time: 02/16/21  1:00 PM   Specimen: BLOOD  Result Value Ref Range Status   Specimen Description   Final    BLOOD BLOOD LEFT FOREARM Performed at [facility], [address], [place], [place] [address]    Special Requests   Final    Blood Culture adequate volume BOTTLES DRAWN AEROBIC AND ANAEROBIC Performed at [facility], [address] [place], [place] [address]    Culture   Final    NO GROWTH < 24 HOURS Performed at [facility], [place] [address], [place], [place] [address]    Report Status PENDING  Incomplete  Blood Culture (routine x 2)     Status: None (Preliminary result)   Collection Time: 02/16/21  1:00 PM   Specimen: BLOOD  Result Value Ref Range Status   Specimen Description   Final    BLOOD BLOOD RIGHT FOREARM Performed at [facility], [address] [place], [place] [address]    Special Requests   Final    Blood Culture adequate volume BOTTLES DRAWN AEROBIC AND ANAEROBIC Performed at [facility], [address], [place], [place] [address]    Culture   Final    NO GROWTH < 24 HOURS Performed at [facility], [place] [address] [place], [place] [address]    Report Status PENDING  Incomplete  Resp Panel by RT-PCR (Flu A&B, Covid) Nasopharyngeal Swab     Status: None   Collection Time: 02/16/21 11:11 PM   Specimen: Nasopharyngeal Swab; Nasopharyngeal(NP) swabs in vial transport medium  Result Value Ref Range Status   SARS Coronavirus 2 by RT PCR NEGATIVE NEGATIVE Final    Comment:  (NOTE) SARS-CoV-2 target nucleic acids are NOT DETECTED.  The SARS-CoV-2 RNA is generally detectable in upper respiratory specimens during the acute phase of infection. The lowest concentration of SARS-CoV-2 viral copies this assay can detect is 138 copies/mL. A negative result does not preclude SARS-Cov-2 infection and should not be used as the sole basis for treatment or other patient management decisions. A negative result may occur with  improper specimen collection/handling, submission of specimen other than nasopharyngeal swab, presence of viral mutation(s) within the areas targeted by this assay, and inadequate number of viral copies(<138 copies/mL). A negative result must be combined with clinical observations, patient history, and epidemiological information. The expected result is Negative.  Fact Sheet for Patients:  EntrepreneurPulse.com.au  Fact Sheet for Healthcare Providers:  IncredibleEmployment.be  This test is no t yet approved or cleared by the [place] FDA and  has been authorized for detection and/or diagnosis of SARS-CoV-2 by FDA under an Emergency Use Authorization (EUA). This EUA will remain  in effect (meaning this test can be used) for the duration of the COVID-19 declaration under Section 564(b)(1) of the Act, 21 U.S.C.section 360bbb-3(b)(1), unless the authorization is terminated  or revoked sooner.       Influenza A by PCR NEGATIVE NEGATIVE Final   Influenza B by PCR NEGATIVE NEGATIVE  Final    Comment: (NOTE) The Xpert Xpress SARS-CoV-2/FLU/RSV plus assay is intended as an aid in the diagnosis of influenza from Nasopharyngeal swab specimens and should not be used as a sole basis for treatment. Nasal washings and aspirates are unacceptable for Xpert Xpress SARS-CoV-2/FLU/RSV testing.  Fact Sheet for Patients: EntrepreneurPulse.com.au  Fact Sheet for Healthcare  Providers: IncredibleEmployment.be  This test is not yet approved or cleared by the [place] FDA and has been authorized for detection and/or diagnosis of SARS-CoV-2 by FDA under an Emergency Use Authorization (EUA). This EUA will remain in effect (meaning this test can be used) for the duration of the COVID-19 declaration under Section 564(b)(1) of the Act, 21 U.S.C. section 360bbb-3(b)(1), unless the authorization is terminated or revoked.  Performed at [facility], [place] [address], [place], [place] [address]     Radiology Reports DG Wrist Complete Right  Result Date: 02/04/2021 CLINICAL DATA:  [place] 3 days ago.  Right wrist pain and swelling. EXAM: RIGHT WRIST - COMPLETE 3+ VIEW COMPARISON:  None. FINDINGS: No fracture or bone lesion. Wrist joints are normally spaced and aligned. No arthropathic changes. There is soft tissue swelling that predominates over the radial aspect of the wrist. IMPRESSION: 1. No fracture or dislocation. 2. Soft tissue swelling. Electronically Signed   By: Lajean Manes M.D.   On: 02/04/2021 16:53   DG Chest Port 1 View  Result Date: 02/16/2021 CLINICAL DATA:  Questionable sepsis.  Mid upper back pain. EXAM: PORTABLE CHEST 1 VIEW COMPARISON:  Rib x-ray June 23, 2020.  Chest CT June 23, 2020. FINDINGS: There is new infiltrate in the right upper lobe compared to previous studies. Emphysematous changes are seen in the lungs. The heart, hila, mediastinum are unremarkable. No pneumothorax. No other acute abnormalities. IMPRESSION: New infiltrate in the right upper lobe worrisome for pneumonia given history. Recommend short-term follow-up imaging after treatment to ensure resolution. Electronically Signed   By: Dorise Bullion III M.D   On: 02/16/2021 13:58

## 2021-02-18 LAB — COMPREHENSIVE METABOLIC PANEL
ALT: 18 U/L (ref 0–44)
AST: 26 U/L (ref 15–41)
Albumin: 2.7 g/dL — ABNORMAL LOW (ref 3.5–5.0)
Alkaline Phosphatase: 108 U/L (ref 38–126)
Anion gap: 7 (ref 5–15)
BUN: 13 mg/dL (ref 8–23)
CO2: 24 mmol/L (ref 22–32)
Calcium: 8.4 mg/dL — ABNORMAL LOW (ref 8.9–10.3)
Chloride: 106 mmol/L (ref 98–111)
Creatinine, Ser: 1.24 mg/dL (ref 0.61–1.24)
GFR, Estimated: >60 mL/min (ref >60)
Glucose, Bld: 102 mg/dL — ABNORMAL HIGH (ref 70–99)
Potassium: 3.5 mmol/L (ref 3.5–5.1)
Sodium: 137 mmol/L (ref 135–145)
Total Bilirubin: 0.7 mg/dL (ref 0.3–1.2)
Total Protein: 5.8 g/dL — ABNORMAL LOW (ref 6.5–8.1)

## 2021-02-18 LAB — CBC WITH DIFFERENTIAL/PLATELET
Abs Immature Granulocytes: 0.13 10*3/uL — ABNORMAL HIGH (ref 0.00–0.07)
Basophils Absolute: 0.1 10*3/uL (ref 0.0–0.1)
Basophils Relative: 0 %
Eosinophils Absolute: 0.1 10*3/uL (ref 0.0–0.5)
Eosinophils Relative: 1 %
HCT: 35.2 % — ABNORMAL LOW (ref 39.0–52.0)
Hemoglobin: 10.9 g/dL — ABNORMAL LOW (ref 13.0–17.0)
Immature Granulocytes: 1 %
Lymphocytes Relative: 9 %
Lymphs Abs: 1.8 10*3/uL (ref 0.7–4.0)
MCH: 28.7 pg (ref 26.0–34.0)
MCHC: 31 g/dL (ref 30.0–36.0)
MCV: 92.6 fL (ref 80.0–100.0)
Monocytes Absolute: 2.4 10*3/uL — ABNORMAL HIGH (ref 0.1–1.0)
Monocytes Relative: 12 %
Neutro Abs: 15.4 10*3/uL — ABNORMAL HIGH (ref 1.7–7.7)
Neutrophils Relative %: 77 %
Platelets: 277 10*3/uL (ref 150–400)
RBC: 3.8 MIL/uL — ABNORMAL LOW (ref 4.22–5.81)
RDW: 13.2 % (ref 11.5–15.5)
WBC: 20 10*3/uL — ABNORMAL HIGH (ref 4.0–10.5)
nRBC: 0 % (ref 0.0–0.2)

## 2021-02-18 LAB — MAGNESIUM: Magnesium: 2 mg/dL (ref 1.7–2.4)

## 2021-02-18 LAB — PROCALCITONIN: Procalcitonin: 0.46 ng/mL

## 2021-02-18 LAB — BRAIN NATRIURETIC PEPTIDE: B Natriuretic Peptide: 354.5 pg/mL — ABNORMAL HIGH (ref 0.0–100.0)

## 2021-02-18 LAB — C-REACTIVE PROTEIN: CRP: 23.1 mg/dL — ABNORMAL HIGH (ref <1.0)

## 2021-02-18 LAB — D-DIMER, QUANTITATIVE: D-Dimer, Quant: 1.37 ug/mL-FEU — ABNORMAL HIGH (ref 0.00–0.50)

## 2021-02-18 MED ORDER — AMLODIPINE BESYLATE 10 MG PO TABS
10.0000 mg | ORAL_TABLET | Freq: Every day | ORAL | Status: DC
  Administered 2021-02-18 – 2021-02-19 (×2): 10 mg via ORAL
  Filled 2021-02-18 (×2): qty 1

## 2021-02-18 MED ORDER — METOPROLOL SUCCINATE ER 25 MG PO TB24
25.0000 mg | ORAL_TABLET | Freq: Every day | ORAL | Status: DC
  Administered 2021-02-18 – 2021-02-19 (×2): 25 mg via ORAL
  Filled 2021-02-18 (×2): qty 1

## 2021-02-18 MED ORDER — SODIUM CHLORIDE 0.9 % IV SOLN
3.0000 g | Freq: Four times a day (QID) | INTRAVENOUS | Status: DC
  Administered 2021-02-18 – 2021-02-19 (×6): 3 g via INTRAVENOUS
  Filled 2021-02-18: qty 8
  Filled 2021-02-18: qty 3
  Filled 2021-02-18 (×5): qty 8
  Filled 2021-02-18: qty 3
  Filled 2021-02-18: qty 8

## 2021-02-18 NOTE — Evaluation (Signed)
Physical Therapy Evaluation Patient Details Name: Travis Salinas MRN: [id number] DOB: 1951/06/06 Today's Date: 02/18/2021   History of Present Illness  Pt is a 70 y/o male admitted secondary to upper back pain and found to have severe sepsis secondary to R UL PNA. PMH including but not limited to COPD, substance use disorder (cocaine), HTN, history of CVA, depression/anxiety and chronic pain.  Clinical Impression  Pt presented supine in bed with HOB elevated, initially awake but lethargic throughout and willing to participate in therapy session. Prior to admission, pt reported that he was independent with all functional mobility and ADLs. Pt lives with a roommate in a single level condo with three flights of stairs to enter. He reported that he has difficulties with going up and down those stairs at baseline due to fatigue. Additionally, he stated that he wears supplemental O2 at home (2L) PRN. Pt initially on 3L of O2 with SpO2 in the mid 90's. He was able to ambulate a short distance in his room on RA but his SpO2 decreased to as low as 86% with a quick recovery to >70% with reapplication of supplemental O2. Pt would continue to benefit from skilled physical therapy services at this time while admitted and after d/c to address the below listed limitations in order to improve overall safety and independence with functional mobility.     Follow Up Recommendations Home health PT;Supervision/Assistance - 24 hour    Equipment Recommendations  None recommended by PT (pt has RW at home if needed)    Recommendations for Other Services       Precautions / Restrictions Precautions Precautions: Fall Precaution Comments: monitor SpO2 Restrictions Weight Bearing Restrictions: No      Mobility  Bed Mobility Overal bed mobility: Needs Assistance Bed Mobility: Supine to Sit;Sit to Supine     Supine to sit: Supervision Sit to supine: Supervision   General bed mobility comments: for safety,  no physical assist needed    Transfers Overall transfer level: Needs assistance Equipment used: None Transfers: Sit to/from Stand Sit to Stand: Supervision         General transfer comment: steady with transitional movement  Ambulation/Gait Ambulation/Gait assistance: Min guard;Min assist Gait Distance (Feet): 50 Feet Assistive device: 1 person hand held assist Gait Pattern/deviations: Step-through pattern;Decreased stride length Gait velocity: decreased   General Gait Details: pt with slow, guarded gait with one minor LOB with directional change that required min A to recover, otherwise pt steady without physical assistance needed  Stairs            Wheelchair Mobility    Modified Rankin (Stroke Patients Only)       Balance Overall balance assessment: Needs assistance Sitting-balance support: Feet supported Sitting balance-Leahy Scale: Good     Standing balance support: During functional activity;No upper extremity supported Standing balance-Leahy Scale: Fair                               Pertinent Vitals/Pain Pain Assessment: Faces Faces Pain Scale: Hurts little more Pain Location: back Pain Descriptors / Indicators: Guarding Pain Intervention(s): Monitored during session;Repositioned    Home Living Family/patient expects to be discharged to:: Private residence Living Arrangements: Non-relatives/Friends Available Help at Discharge: Friend(s);Available PRN/intermittently Type of Home: Other(Comment) (condo) Home Access: Stairs to enter Entrance Stairs-Rails: Right;Left Entrance Stairs-Number of Steps: 4 flights Home Layout: One level Home Equipment: Environmental consultant - 2 wheels;Cane - single point  Prior Function Level of Independence: Independent         Comments: uses home O2 PRN (2L when he uses it)     Hand Dominance        Extremity/Trunk Assessment   Upper Extremity Assessment Upper Extremity Assessment: Defer to OT  evaluation    Lower Extremity Assessment Lower Extremity Assessment: Generalized weakness       Communication   Communication: HOH  Cognition Arousal/Alertness: Lethargic Behavior During Therapy: WFL for tasks assessed/performed Overall Cognitive Status: Impaired/Different from baseline Area of Impairment: Safety/judgement                         Safety/Judgement: Decreased awareness of safety;Decreased awareness of deficits            General Comments      Exercises     Assessment/Plan    PT Assessment Patient needs continued PT services  PT Problem List Decreased strength;Decreased activity tolerance;Decreased balance;Decreased mobility;Decreased coordination;Decreased knowledge of use of DME;Decreased safety awareness;Decreased knowledge of precautions;Cardiopulmonary status limiting activity       PT Treatment Interventions DME instruction;Gait training;Stair training;Functional mobility training;Therapeutic activities;Therapeutic exercise;Neuromuscular re-education;Balance training;Patient/family education    PT Goals (Current goals can be found in the Care Plan section)  Acute Rehab PT Goals Patient Stated Goal: decrease pain PT Goal Formulation: With patient Time For Goal Achievement: 03/04/21 Potential to Achieve Goals: Good    Frequency Min 3X/week   Barriers to discharge        Co-evaluation               AM-PAC PT "6 Clicks" Mobility  Outcome Measure Help needed turning from your back to your side while in a flat bed without using bedrails?: None Help needed moving from lying on your back to sitting on the side of a flat bed without using bedrails?: None Help needed moving to and from a bed to a chair (including a wheelchair)?: None Help needed standing up from a chair using your arms (e.g., wheelchair or bedside chair)?: None Help needed to walk in hospital room?: A Little Help needed climbing 3-5 steps with a railing? : A  Little 6 Click Score: 22    End of Session Equipment Utilized During Treatment: Oxygen Activity Tolerance: Patient tolerated treatment well Patient left: in bed;with call bell/phone within reach;with bed alarm set Nurse Communication: Mobility status PT Visit Diagnosis: Other abnormalities of gait and mobility (R26.89)    Time: 1610-9604 PT Time Calculation (min) (ACUTE ONLY): 16 min   Charges:   PT Evaluation $PT Eval Moderate Complexity: 1 Mod          Eduard Clos, PT, DPT  Acute Rehabilitation Services Pager [phone] Office [place] 02/18/2021, 11:30 AM

## 2021-02-18 NOTE — Progress Notes (Addendum)
Pharmacy Antibiotic Note  Travis Salinas is a 70 y.o. male admitted on 02/16/2021 with back pain, found to be febrile and tachycardic. Patient was diagnosed with COVID in May and was treated with steroids recently. Initially stared on vancomycin, cefepime, and metronidazole but was switched to azithromycin and ceftriaxone. Pharmacy has now been consulted for Unasyn dosing for aspiration pneumonia.  Today, patient is afebrile with WBC elevated at 20. Scr 1.24. No growth on cultures to date. Continues on 10 L of HFNC.   Plan: Stop ceftriaxone Continue azithromycin Start Unasyn 3g IV q6h  - Monitor for clinical improvement, LOT, and deescalation   Height: 5\' 10"  (177.8 cm) Weight: 75 kg (165 lb 5.5 oz) IBW/kg (Calculated) : 73  Temp (24hrs), Avg:99.1 F (37.3 C), Min:98 F (36.7 C), Max:101.1 F (38.4 C)  Recent Labs  Lab 02/16/21 1237 02/16/21 1456 02/17/21 0025 02/18/21 0040  WBC 18.9*  --  29.1* 20.0*  CREATININE 1.60*  --  1.32* 1.24  LATICACIDVEN 1.4 0.9  --   --      Estimated Creatinine Clearance: 58.9 mL/min (by C-G formula based on SCr of 1.24 mg/dL).    Allergies  Allergen Reactions   Propoxyphene Nausea And Vomiting   Ciprofloxacin Nausea Only   Depakote [Divalproex Sodium] Other (See Comments)    hallucinations   Amoxicillin Nausea And Vomiting    Tolerated cefazolin    Antimicrobials this admission: Vanc 6/10  Cefepime 6/10  Metronidazole 6/10 CTX 6/10 > 6/12 Azith 6/10 >> Unasyn 6/12 >>   Microbiology results: 6/10 BCx: ngtd  6/10 UCx: negative    Thank you for allowing pharmacy to be a part of this patient's care.  Claudina Lick, PharmD PGY1 Acute Care Pharmacy Resident 02/18/2021 7:25 AM  Please check [email or web address] for unit-specific pharmacy phone numbers.

## 2021-02-18 NOTE — Progress Notes (Signed)
PROGRESS NOTE                                                                                                                                                                                                             Patient Demographics:    Travis Salinas, is a 70 y.o. male, DOB - [birth year], UEA:[id number]  Outpatient Primary MD for the patient is Kathyrn Lass, MD    LOS - 2  Admit date - 02/16/2021    Chief Complaint  Patient presents with   Back Pain       Brief Narrative (HPI from H&P) - Travis Salinas is a 70 y.o. male with medical history significant for COPD, substance use disorder (cocaine), HTN, history of CVA, depression/anxiety, chronic pain who presented to the ED for evaluation of upper back pain, he had Covid 3.5 weeks ago, he was diagnosed now with RUL Pneumonia and admitted.    Subjective:   Patient in bed, appears comfortable, denies any headache, no fever, no chest pain or pressure, no shortness of breath , no abdominal pain. No new focal weakness.   Assessment  & Plan :     Severe sepsis due to right upper lobe pneumonia - Sepsis physiology has resolved, continue azithromycin and switch Rocephin to Unasyn, cleared by speech, I think he has aspirated due to excessive narcotic use at home, strictly counseled not to overuse or abuse his narcotics and sedative medications.  Encouraged to sit up in chair and use flutter valve every 30 minutes while awake for pulmonary toiletry.   Acute kidney injury: Secondary to sepsis. Hold home losartan, AKI almost resolved.   COPD: Stable without evidence of acute exacerbation.  Continue Anora Ellipta and DuoNeb as needed.   History of CVA: Continue aspirin and atorvastatin.   Hypertension: Placed on Norvasc and home dose Toprol.   Depression/anxiety: Continue home Abilify, Prozac, Lamictal, topiramate.  Continue home alprazolam 3 times daily as needed with  hold parameters.   Chronic pain: Continue home oxycodone 20 mg 3 times daily as needed with hold parameters.  [place] substance database is reviewed and shows that he is filling oxycodone and alprazolam consistently.   Substance use disorder : UDS positive for cocaine.  Patient states he does not want his wife notified regarding his substance use.  BPH - flomax.  Condition - Extremely Guarded  Family Communication  :  None  Code Status :  Fair  Consults  :  None  PUD Prophylaxis :    Procedures  :           Disposition Plan  :    Status is: Inpatient  Remains inpatient appropriate because:IV treatments appropriate due to intensity of illness or inability to take PO  Dispo: The patient is from: Home              Anticipated d/c is to: Home              Patient currently is not medically stable to d/c.   Difficult to place patient No  DVT Prophylaxis  :    enoxaparin (LOVENOX) injection 40 mg Start: 02/16/21 2100   Lab Results  Component Value Date   PLT 277 02/18/2021    Diet :  Diet Order             Diet Heart Room service appropriate? Yes; Fluid consistency: Thin  Diet effective now                    Inpatient Medications  Scheduled Meds:  amLODipine  10 mg Oral Daily   ARIPiprazole  10 mg Oral Daily   aspirin EC  81 mg Oral Daily   atorvastatin  40 mg Oral Daily   enoxaparin (LOVENOX) injection  40 mg Subcutaneous Q24H   FLUoxetine  10 mg Oral Daily   fluticasone  1 spray Each Nare Daily   guaiFENesin  600 mg Oral BID   lamoTRIgine  200 mg Oral Daily   tamsulosin  0.4 mg Oral QHS   topiramate  25 mg Oral Daily   umeclidinium-vilanterol  1 puff Inhalation Daily   Continuous Infusions:  sodium chloride 250 mL (02/16/21 1312)   ampicillin-sulbactam (UNASYN) IV 3 g (02/18/21 0847)   azithromycin 500 mg (02/17/21 2201)   PRN Meds:.sodium chloride, acetaminophen **OR** acetaminophen, alprazolam, ipratropium-albuterol, [DISCONTINUED]  ondansetron **OR** ondansetron (ZOFRAN) IV, oxyCODONE, polyethylene glycol, senna-docusate  Antibiotics  :    Anti-infectives (From admission, onward)    Start     Dose/Rate Route Frequency Ordered Stop   02/18/21 0830  Ampicillin-Sulbactam (UNASYN) 3 g in sodium chloride 0.9 % 100 mL IVPB        3 g 200 mL/hr over 30 Minutes Intravenous Every 6 hours 02/18/21 0728     02/17/21 1000  vancomycin (VANCOCIN) IVPB 1000 mg/200 mL premix  Status:  Discontinued        1,000 mg 200 mL/hr over 60 Minutes Intravenous Every 24 hours 02/16/21 1408 02/16/21 1928   02/17/21 0100  ceFEPIme (MAXIPIME) 2 g in sodium chloride 0.9 % 100 mL IVPB  Status:  Discontinued        2 g 200 mL/hr over 30 Minutes Intravenous Every 12 hours 02/16/21 1408 02/16/21 1928   02/16/21 2015  cefTRIAXone (ROCEPHIN) 2 g in sodium chloride 0.9 % 100 mL IVPB  Status:  Discontinued        2 g 200 mL/hr over 30 Minutes Intravenous Every 24 hours 02/16/21 1928 02/18/21 0711   02/16/21 2015  azithromycin (ZITHROMAX) 500 mg in sodium chloride 0.9 % 250 mL IVPB        500 mg 250 mL/hr over 60 Minutes Intravenous Every 24 hours 02/16/21 1928     02/16/21 1245  ceFEPIme (MAXIPIME) 2 g in sodium chloride 0.9 % 100 mL IVPB  2 g 200 mL/hr over 30 Minutes Intravenous  Once 02/16/21 1243 02/16/21 1505   02/16/21 1245  metroNIDAZOLE (FLAGYL) IVPB 500 mg        500 mg 100 mL/hr over 60 Minutes Intravenous  Once 02/16/21 1244 02/16/21 1452   02/16/21 1245  vancomycin (VANCOCIN) IVPB 1000 mg/200 mL premix        1,000 mg 200 mL/hr over 60 Minutes Intravenous  Once 02/16/21 1244 02/16/21 1516        Time Spent in minutes  30   Lala Lund M.D on 02/18/2021 at 11:11 AM  To page go to [email or web address]   [facility] -  Office  [phone]   See all Orders from today for further details    Objective:   Vitals:   02/17/21 2043 02/17/21 2113 02/18/21 0327 02/18/21 0854  BP:   (!) 163/83   Pulse:   83   Resp:   13    Temp: (!) 101.1 F (38.4 C) 98.7 F (37.1 C) 98.5 F (36.9 C)   TempSrc:   Axillary   SpO2:   97% 97%  Weight:      Height:        Wt Readings from Last 3 Encounters:  02/16/21 75 kg  01/09/21 65.8 kg  01/08/21 66.7 kg     Intake/Output Summary (Last 24 hours) at 02/18/2021 1111 Last data filed at 02/18/2021 0300 Gross per 24 hour  Intake 180 ml  Output 600 ml  Net -420 ml     Physical Exam  Somnolent but arousable, alert, No new F.N deficits, Normal affect [place].AT,PERRAL Supple Neck,No JVD, No cervical lymphadenopathy appriciated.  Symmetrical Chest wall movement, Good air movement bilaterally, CTAB RRR,No Gallops, Rubs or new Murmurs, No Parasternal Heave +ve B.Sounds, Abd Soft, No tenderness, No organomegaly appriciated, No rebound - guarding or rigidity. No Cyanosis, Clubbing or edema, No new Rash or bruise    Data Review:    CBC Recent Labs  Lab 02/16/21 1237 02/17/21 0025 02/18/21 0040  WBC 18.9* 29.1* 20.0*  HGB 11.5* 10.6* 10.9*  HCT 37.4* 34.5* 35.2*  PLT 251 221 277  MCV 92.8 94.0 92.6  MCH 28.5 28.9 28.7  MCHC 30.7 30.7 31.0  RDW 13.3 13.2 13.2  LYMPHSABS 0.8  --  1.8  MONOABS 1.8*  --  2.4*  EOSABS 0.1  --  0.1  BASOSABS 0.0  --  0.1    Recent Labs  Lab 02/16/21 1237 02/16/21 1456 02/17/21 0025 02/17/21 0756 02/18/21 0040  NA 139  --  139  --  137  K 3.9  --  3.9  --  3.5  CL 103  --  107  --  106  CO2 24  --  22  --  24  GLUCOSE 118*  --  98  --  102*  BUN 23  --  18  --  13  CREATININE 1.60*  --  1.32*  --  1.24  CALCIUM 9.1  --  8.3*  --  8.4*  AST 25  --   --   --  26  ALT 19  --   --   --  18  ALKPHOS 90  --   --   --  108  BILITOT 0.9  --   --   --  0.7  ALBUMIN 3.8  --   --   --  2.7*  MG  --   --   --  2.0 2.0  CRP  --   --   --  23.1* 23.1*  DDIMER  --   --   --  0.85* 1.37*  PROCALCITON  --   --  0.59  --  0.46  LATICACIDVEN 1.4 0.9  --   --   --   INR 1.0  --   --   --   --   AMMONIA  --  33  --   --   --    BNP  --   --   --  494.4* 354.5*    ------------------------------------------------------------------------------------------------------------------ No results for input(s): CHOL, HDL, LDLCALC, TRIG, CHOLHDL, LDLDIRECT in the last 72 hours.  Lab Results  Component Value Date   HGBA1C 5.2 01/02/2020   ------------------------------------------------------------------------------------------------------------------ No results for input(s): TSH, T4TOTAL, T3FREE, THYROIDAB in the last 72 hours.  Invalid input(s): FREET3  Cardiac Enzymes No results for input(s): CKMB, TROPONINI, MYOGLOBIN in the last 168 hours.  Invalid input(s): CK ------------------------------------------------------------------------------------------------------------------    Component Value Date/Time   BNP 354.5 (H) 02/18/2021 0040    Micro Results Recent Results (from the past 240 hour(s))  Blood Culture (routine x 2)     Status: None (Preliminary result)   Collection Time: 02/16/21  1:00 PM   Specimen: BLOOD  Result Value Ref Range Status   Specimen Description   Final    BLOOD BLOOD LEFT FOREARM Performed at [facility], [address] [place], [place] [address]    Special Requests   Final    Blood Culture adequate volume BOTTLES DRAWN AEROBIC AND ANAEROBIC Performed at [facility], [address] [place], [place] [address]    Culture   Final    NO GROWTH 2 DAYS Performed at [facility], [place] [address], [place], [place] [address]    Report Status PENDING  Incomplete  Blood Culture (routine x 2)     Status: None (Preliminary result)   Collection Time: 02/16/21  1:00 PM   Specimen: BLOOD  Result Value Ref Range Status   Specimen Description   Final    BLOOD BLOOD RIGHT FOREARM Performed at [facility], [address], [place], [place] [address]    Special Requests   Final    Blood Culture adequate volume BOTTLES DRAWN  AEROBIC AND ANAEROBIC Performed at [facility], [address], , [place] [address]    Culture   Final    NO GROWTH 2 DAYS Performed at [facility], [place] [address], [place], [place] [address]    Report Status PENDING  Incomplete  Urine culture     Status: None   Collection Time: 02/16/21  2:45 PM   Specimen: In/Out Cath Urine  Result Value Ref Range Status   Specimen Description   Final    IN/OUT CATH URINE Performed at [facility], [address] [place], [place] [address]    Special Requests   Final    NONE Performed at [facility], [address] [place], [place] [address]    Culture   Final    NO GROWTH Performed at [facility], [place] [address], [place], [place] [address]    Report Status 02/17/2021 FINAL  Final  Resp Panel by RT-PCR (Flu A&B, Covid) Nasopharyngeal Swab     Status: None   Collection Time: 02/16/21 11:11 PM   Specimen: Nasopharyngeal Swab; Nasopharyngeal(NP) swabs in vial transport medium  Result Value Ref Range Status   SARS Coronavirus 2 by RT PCR NEGATIVE NEGATIVE Final    Comment: (NOTE)  SARS-CoV-2 target nucleic acids are NOT DETECTED.  The SARS-CoV-2 RNA is generally detectable in upper respiratory specimens during the acute phase of infection. The lowest concentration of SARS-CoV-2 viral copies this assay can detect is 138 copies/mL. A negative result does not preclude SARS-Cov-2 infection and should not be used as the sole basis for treatment or other patient management decisions. A negative result may occur with  improper specimen collection/handling, submission of specimen other than nasopharyngeal swab, presence of viral mutation(s) within the areas targeted by this assay, and inadequate number of viral copies(<138 copies/mL). A negative result must be combined with clinical observations, patient history, and epidemiological information. The expected result  is Negative.  Fact Sheet for Patients:  EntrepreneurPulse.com.au  Fact Sheet for Healthcare Providers:  IncredibleEmployment.be  This test is no t yet approved or cleared by the [place] FDA and  has been authorized for detection and/or diagnosis of SARS-CoV-2 by FDA under an Emergency Use Authorization (EUA). This EUA will remain  in effect (meaning this test can be used) for the duration of the COVID-19 declaration under Section 564(b)(1) of the Act, 21 U.S.C.section 360bbb-3(b)(1), unless the authorization is terminated  or revoked sooner.       Influenza A by PCR NEGATIVE NEGATIVE Final   Influenza B by PCR NEGATIVE NEGATIVE Final    Comment: (NOTE) The Xpert Xpress SARS-CoV-2/FLU/RSV plus assay is intended as an aid in the diagnosis of influenza from Nasopharyngeal swab specimens and should not be used as a sole basis for treatment. Nasal washings and aspirates are unacceptable for Xpert Xpress SARS-CoV-2/FLU/RSV testing.  Fact Sheet for Patients: EntrepreneurPulse.com.au  Fact Sheet for Healthcare Providers: IncredibleEmployment.be  This test is not yet approved or cleared by the [place] FDA and has been authorized for detection and/or diagnosis of SARS-CoV-2 by FDA under an Emergency Use Authorization (EUA). This EUA will remain in effect (meaning this test can be used) for the duration of the COVID-19 declaration under Section 564(b)(1) of the Act, 21 U.S.C. section 360bbb-3(b)(1), unless the authorization is terminated or revoked.  Performed at [facility], [place] [address], [place], [place] [address]   MRSA Next Gen by PCR, Nasal     Status: None   Collection Time: 02/17/21  7:25 AM  Result Value Ref Range Status   MRSA by PCR Next Gen NOT DETECTED NOT DETECTED Final    Comment: (NOTE) The GeneXpert MRSA Assay (FDA approved for NASAL specimens only), is one component of a  comprehensive MRSA colonization surveillance program. It is not intended to diagnose MRSA infection nor to guide or monitor treatment for MRSA infections. Test performance is not FDA approved in patients less than 69 years old. Performed at [facility], [place] [address], [place],  [address]     Radiology Reports DG Wrist Complete Right  Result Date: 02/04/2021 CLINICAL DATA:  [place] 3 days ago.  Right wrist pain and swelling. EXAM: RIGHT WRIST - COMPLETE 3+ VIEW COMPARISON:  None. FINDINGS: No fracture or bone lesion. Wrist joints are normally spaced and aligned. No arthropathic changes. There is soft tissue swelling that predominates over the radial aspect of the wrist. IMPRESSION: 1. No fracture or dislocation. 2. Soft tissue swelling. Electronically Signed   By: Lajean Manes M.D.   On: 02/04/2021 16:53   DG Chest Port 1 View  Result Date: 02/17/2021 CLINICAL DATA:  Shortness of breath. EXAM: PORTABLE CHEST 1 VIEW COMPARISON:  Chest x-ray 02/16/2021 and prior chest CT 06/23/2020  FINDINGS: The cardiac silhouette, mediastinal and hilar contours are within normal limits and stable. There is tortuosity and calcification of the thoracic aorta. Stable significant underlying emphysematous changes and pulmonary scarring. Slight increased opacity in the right upper lobe. Could not exclude superimposed right upper lobe infiltrate. No pleural effusions or pulmonary edema. IMPRESSION: 1. Chronic emphysematous changes and pulmonary scarring. 2. Possible superimposed right upper lobe infiltrate. Electronically Signed   By: Marijo Sanes M.D.   On: 02/17/2021 10:16   DG Chest Port 1 View  Result Date: 02/16/2021 CLINICAL DATA:  Questionable sepsis.  Mid upper back pain. EXAM: PORTABLE CHEST 1 VIEW COMPARISON:  Rib x-ray June 23, 2020.  Chest CT June 23, 2020. FINDINGS: There is new infiltrate in the right upper lobe compared to previous studies. Emphysematous changes are seen in the lungs.  The heart, hila, mediastinum are unremarkable. No pneumothorax. No other acute abnormalities. IMPRESSION: New infiltrate in the right upper lobe worrisome for pneumonia given history. Recommend short-term follow-up imaging after treatment to ensure resolution. Electronically Signed   By: Dorise Bullion III M.D   On: 02/16/2021 13:58

## 2021-02-18 NOTE — Evaluation (Signed)
Occupational Therapy Evaluation Patient Details Name: Nisaiah Bechtol MRN: [id number] DOB: 23-Jul-1951 Today's Date: 02/18/2021    History of Present Illness Pt is a 70 y/o male admitted secondary to upper back pain and found to have severe sepsis secondary to R UL PNA. PMH including but not limited to COPD, substance use disorder (cocaine), HTN, history of CVA, depression/anxiety and chronic pain.   Clinical Impression   PTA, pt reports he was independent and lived with a roommate. Currently, pt performing standing ADLs and functional mobility with min guard and increased time. Pt educated and demonstrated flutter valve (x5) and incentive spirometer (x5). Pt presenting with decreased activity tolerance, strength, and cognition. Recommend discharge home with [place], and will continue to follow acutely to optimize activity tolerance and indepedence in ADLs.     Follow Up Recommendations  Home health OT    Equipment Recommendations  3 in 1 bedside commode    Recommendations for Other Services PT consult     Precautions / Restrictions Precautions Precautions: Fall Precaution Comments: monitor SpO2 Restrictions Weight Bearing Restrictions: No      Mobility Bed Mobility Overal bed mobility: Needs Assistance Bed Mobility: Supine to Sit;Sit to Supine     Supine to sit: Supervision Sit to supine: Supervision   General bed mobility comments: for safety, no physical assist needed    Transfers Overall transfer level: Needs assistance Equipment used: None Transfers: Sit to/from Stand Sit to Stand: Supervision         General transfer comment: supervision for safety    Balance Overall balance assessment: Needs assistance Sitting-balance support: Feet supported;No upper extremity supported Sitting balance-Leahy Scale: Good Sitting balance - Comments: Pt able to lift foot to knee   Standing balance support: During functional activity;No upper extremity supported Standing  balance-Leahy Scale: Fair                             ADL either performed or assessed with clinical judgement   ADL Overall ADL's : Needs assistance/impaired Eating/Feeding: Set up;Sitting   Grooming: Oral care;Standing;Min guard Grooming Details (indicate cue type and reason): Supporting self on sink. Noting pt closing eyes during performance oral care. Upper Body Bathing: Sitting;Supervision/ safety;Set up   Lower Body Bathing: Sit to/from stand;Min guard   Upper Body Dressing : Set up;Sitting;Supervision/safety   Lower Body Dressing: Min guard;Sit to/from stand   Toilet Transfer: Min guard;Ambulation;Regular Museum/gallery exhibitions officer and Hygiene: Min guard;Sit to/from stand       Functional mobility during ADLs: Min guard;Cueing for safety General ADL Comments: Pt requiring min cues for safety during functional mobility not to hold onto O2 tank as a support. Pt requiring min cues to follow commands to go to sink and complete oral hygiene.     Vision Baseline Vision/History: Wears glasses Vision Assessment?: No apparent visual deficits     Perception Perception Perception Tested?: No Comments: no apparent difficulty with perception   Praxis Praxis Praxis tested?: Not tested Praxis-Other Comments: no apparent motor planning difficulties    Pertinent Vitals/Pain Pain Assessment: Faces Faces Pain Scale: Hurts little more Pain Location: back Pain Descriptors / Indicators: Guarding Pain Intervention(s): Monitored during session     Hand Dominance Right   Extremity/Trunk Assessment Upper Extremity Assessment Upper Extremity Assessment: Generalized weakness   Lower Extremity Assessment Lower Extremity Assessment: Defer to PT evaluation       Communication Communication Communication: [facility]   Cognition Arousal/Alertness:  Awake/alert Behavior During Therapy: WFL for tasks assessed/performed Overall Cognitive Status:  Impaired/Different from baseline Area of Impairment: Safety/judgement;Awareness;Problem solving;Attention;Following commands;Memory                   Current Attention Level: Sustained;Selective Memory: Decreased short-term memory Following Commands: Follows one step commands consistently;Follows one step commands with increased time Safety/Judgement: Decreased awareness of safety;Decreased awareness of deficits Awareness: Intellectual;Emergent Problem Solving: Slow processing;Requires verbal cues General Comments: Pt with difficulty understanding why he needs to use flutter valve and incentive spirometer. Demonstrating decreased safety awareness as indicated by moving away from O2 tank to go to the bed. Decreased awareness of water filling up in the sink when completing oral care, requiring min cues to turn water off.   General Comments  Pt educated and demonstrated use of flutter valve and incentive spirometer. Pt requiring verbal cues to use incentive spirometer and stating he "does not know why he has to use these". Inconsistent with correct use of both, requring min-mod cues. VSS throughout, SpO2 remaining above 90    Exercises Exercises: Other exercises Other Exercises Other Exercises: Incentive spirometer x5 with cuing for technique. Pulling 750 Other Exercises: Flutter valve x5 with cuing for technique   Shoulder Instructions      Home Living Family/patient expects to be discharged to:: Private residence Living Arrangements: Non-relatives/Friends Available Help at Discharge: Friend(s);Available PRN/intermittently Type of Home: Other(Comment) (condo) Home Access: Stairs to enter Entrance Stairs-Number of Steps: 3 flights Entrance Stairs-Rails: Right;Left Home Layout: One level     Bathroom Shower/Tub: Walk-in shower;Tub/shower unit         Home Equipment: Walker - 2 wheels;Cane - single point          Prior Functioning/Environment Level of Independence:  Independent        Comments: uses home O2 PRN (2L when he uses it)        OT Problem List: Decreased strength;Decreased activity tolerance;Impaired balance (sitting and/or standing);Decreased cognition;Decreased safety awareness;Pain;Cardiopulmonary status limiting activity      OT Treatment/Interventions: Self-care/ADL training;Therapeutic exercise;Energy conservation;Therapeutic activities;Cognitive remediation/compensation;Patient/family education    OT Goals(Current goals can be found in the care plan section) Acute Rehab OT Goals Patient Stated Goal: decrease pain OT Goal Formulation: With patient Time For Goal Achievement: 03/04/21 Potential to Achieve Goals: Good ADL Goals Pt Will Perform Grooming: with modified independence;standing Pt Will Perform Lower Body Dressing: with modified independence;sit to/from stand Pt Will Transfer to Toilet: with modified independence;ambulating;regular height toilet Additional ADL Goal #1: Pt will demonstrate correct use of flutter valve and incentive spirometer with 1-2 verbal cues.  OT Frequency: Min 2X/week   Barriers to D/C:            Co-evaluation              AM-PAC OT "6 Clicks" Daily Activity     Outcome Measure Help from another person eating meals?: A Little Help from another person taking care of personal grooming?: A Little Help from another person toileting, which includes using toliet, bedpan, or urinal?: A Little Help from another person bathing (including washing, rinsing, drying)?: A Little Help from another person to put on and taking off regular upper body clothing?: A Little Help from another person to put on and taking off regular lower body clothing?: A Little 6 Click Score: 18   End of Session Equipment Utilized During Treatment: Gait belt;Oxygen (4L) Nurse Communication: Mobility status  Activity Tolerance: Patient tolerated treatment well Patient left: with call bell/phone within  reach;in bed;with  bed alarm set  OT Visit Diagnosis: Unsteadiness on feet (R26.81);Muscle weakness (generalized) (M62.81);Other symptoms and signs involving cognitive function;Pain Pain - Right/Left: Right Pain - part of body:  (ribs)                Time: 9373-4287 OT Time Calculation (min): 32 min Charges:  OT General Charges $OT Visit: 1 Visit OT Evaluation $OT Eval Moderate Complexity: 1 Mod OT Treatments $Self Care/Home Management : 8-22 mins  Shanda Howells, OTDS   Shanda Howells 02/18/2021, 2:16 PM

## 2021-02-19 LAB — CBC WITH DIFFERENTIAL/PLATELET
Abs Immature Granulocytes: 0.1 10*3/uL — ABNORMAL HIGH (ref 0.00–0.07)
Basophils Absolute: 0.1 10*3/uL (ref 0.0–0.1)
Basophils Relative: 0 %
Eosinophils Absolute: 0.3 10*3/uL (ref 0.0–0.5)
Eosinophils Relative: 2 %
HCT: 34.9 % — ABNORMAL LOW (ref 39.0–52.0)
Hemoglobin: 10.9 g/dL — ABNORMAL LOW (ref 13.0–17.0)
Immature Granulocytes: 1 %
Lymphocytes Relative: 14 %
Lymphs Abs: 2.1 10*3/uL (ref 0.7–4.0)
MCH: 28.7 pg (ref 26.0–34.0)
MCHC: 31.2 g/dL (ref 30.0–36.0)
MCV: 91.8 fL (ref 80.0–100.0)
Monocytes Absolute: 1.8 10*3/uL — ABNORMAL HIGH (ref 0.1–1.0)
Monocytes Relative: 12 %
Neutro Abs: 10.2 10*3/uL — ABNORMAL HIGH (ref 1.7–7.7)
Neutrophils Relative %: 71 %
Platelets: 286 10*3/uL (ref 150–400)
RBC: 3.8 MIL/uL — ABNORMAL LOW (ref 4.22–5.81)
RDW: 13.3 % (ref 11.5–15.5)
WBC: 14.4 10*3/uL — ABNORMAL HIGH (ref 4.0–10.5)
nRBC: 0 % (ref 0.0–0.2)

## 2021-02-19 LAB — COMPREHENSIVE METABOLIC PANEL
ALT: 20 U/L (ref 0–44)
AST: 27 U/L (ref 15–41)
Albumin: 2.7 g/dL — ABNORMAL LOW (ref 3.5–5.0)
Alkaline Phosphatase: 147 U/L — ABNORMAL HIGH (ref 38–126)
Anion gap: 6 (ref 5–15)
BUN: 9 mg/dL (ref 8–23)
CO2: 26 mmol/L (ref 22–32)
Calcium: 8.7 mg/dL — ABNORMAL LOW (ref 8.9–10.3)
Chloride: 105 mmol/L (ref 98–111)
Creatinine, Ser: 1.01 mg/dL (ref 0.61–1.24)
GFR, Estimated: >60 mL/min (ref >60)
Glucose, Bld: 89 mg/dL (ref 70–99)
Potassium: 3.9 mmol/L (ref 3.5–5.1)
Sodium: 137 mmol/L (ref 135–145)
Total Bilirubin: 0.8 mg/dL (ref 0.3–1.2)
Total Protein: 6.3 g/dL — ABNORMAL LOW (ref 6.5–8.1)

## 2021-02-19 LAB — D-DIMER, QUANTITATIVE: D-Dimer, Quant: 1.58 ug/mL-FEU — ABNORMAL HIGH (ref 0.00–0.50)

## 2021-02-19 LAB — BRAIN NATRIURETIC PEPTIDE: B Natriuretic Peptide: 306.9 pg/mL — ABNORMAL HIGH (ref 0.0–100.0)

## 2021-02-19 LAB — MAGNESIUM: Magnesium: 1.9 mg/dL (ref 1.7–2.4)

## 2021-02-19 LAB — PROCALCITONIN: Procalcitonin: 0.33 ng/mL

## 2021-02-19 LAB — C-REACTIVE PROTEIN: CRP: 20.1 mg/dL — ABNORMAL HIGH (ref <1.0)

## 2021-02-19 MED ORDER — AZITHROMYCIN 500 MG PO TABS: 500.0000 mg | ORAL_TABLET | Freq: Every day | ORAL | 0 refills | Status: AC

## 2021-02-19 MED ORDER — AMOXICILLIN-POT CLAVULANATE 875-125 MG PO TABS: 1.0000 | ORAL_TABLET | Freq: Two times a day (BID) | ORAL | 0 refills | Status: AC

## 2021-02-19 MED ORDER — ASPIRIN 81 MG PO TBEC: 81.0000 mg | DELAYED_RELEASE_TABLET | Freq: Every morning | ORAL | Status: AC

## 2021-02-19 NOTE — Progress Notes (Signed)
SATURATION QUALIFICATIONS: (This note is used to comply with regulatory documentation for home oxygen)  Patient Saturations on Room Air at Rest = 79%  Patient Saturations on Room Air while Ambulating = %  Patient Saturations on 3 Liters of oxygen while Ambulating = 92%  Please briefly explain why patient needs home oxygen: Patient sitting in chair on room air 79%. Placed on 3l saturation came up to 95%. Patient walked on 3l saturation stayed above 90%

## 2021-02-19 NOTE — Discharge Instructions (Signed)
Follow with Primary MD Travis Lass, MD in 7 days   Get CBC, CMP, 2 view Chest X ray -  checked next visit within 1 week by Primary MD    Activity: As tolerated with Full fall precautions use walker/cane & assistance as needed  Disposition Home    Diet: Heart Healthy with aspiration precautions.    Special Instructions: If you have smoked or chewed Tobacco  in the last 2 yrs please stop smoking, stop any regular Alcohol  and or any Recreational drug use.  On your next visit with your primary care physician please Get Medicines reviewed and adjusted.  Please request your Prim.MD to go over all Hospital Tests and Procedure/Radiological results at the follow up, please get all Hospital records sent to your Prim MD by signing hospital release before you go home.  If you experience worsening of your admission symptoms, develop shortness of breath, life threatening emergency, suicidal or homicidal thoughts you must seek medical attention immediately by calling 911 or calling your MD immediately  if symptoms less severe.  You Must read complete instructions/literature along with all the possible adverse reactions/side effects for all the Medicines you take and that have been prescribed to you. Take any new Medicines after you have completely understood and accpet all the possible adverse reactions/side effects.   Do not drive, operate heavy machinery, perform activities at heights, swimming or participation in water activities or provide baby sitting services if your were admitted for syncope or siezures until you have seen by Primary MD or a Neurologist and advised to do so again.  Do not drive when taking Pain medications.  Do not take more than prescribed Pain, Sleep and Anxiety Medications  Wear Seat belts while driving.   Please note  You were cared for by a hospitalist during your hospital stay. If you have any questions about your discharge medications or the care you received while you  were in the hospital after you are discharged, you can call the unit and asked to speak with the hospitalist on call if the hospitalist that took care of you is not available. Once you are discharged, your primary care physician will handle any further medical issues. Please note that NO REFILLS for any discharge medications will be authorized once you are discharged, as it is imperative that you return to your primary care physician (or establish a relationship with a primary care physician if you do not have one) for your aftercare needs so that they can reassess your need for medications and monitor your lab values.

## 2021-02-19 NOTE — TOC Initial Note (Addendum)
Transition of Care [facility]) - Initial/Assessment Note    Patient Details  Name: Travis Salinas MRN: [id number] Date of Birth: [birth year]  [facility]) CM/SW Contact:    Ninfa Meeker, RN Phone Number: 02/19/2021, 10:05 AM  Clinical Narrative:   Case manager spoke with patient concerning discharge needs. Patient asked that I contact his wife. CM contacted Mrs. St.Clair-[phone], explained that patient will need Home Health therapies at discharge. Permission received to call referral to [facility]. CM has requested 3in1 to be delivered to room from [employer].   2:59pm  Notified patient will need home oxygen. Oxygen will be delivered by [employer]. CM called request to Lucretia with [employer].  Expected Discharge Plan: [place]  Barriers to Discharge: No Barriers Identified   Patient Goals and CMS Choice     Choice offered to / list presented to : Spouse  Expected Discharge Plan and Services Expected Discharge Plan: [place] In-house Referral: NA Discharge Planning Services: CM Consult Post Acute Care Choice: Home Health, Durable Medical Equipment Living arrangements for the past 2 months: Single Family Home Expected Discharge Date: 02/19/21               DME Arranged: 3-N-1 DME Agency: [employer] Date DME Agency Contacted: 02/19/21 Time DME Agency Contacted: [phone] Representative spoke with at DME Agency: [place]: PT, OT [place] Agency: [place] Date [place]: 02/19/21 Time HH Agency Contacted: 0920 Representative spoke with at [place]: [place] Arrangements/Services Living arrangements for the past 2 months: [place] Lives with:: Spouse Patient language and need for interpreter reviewed:: Yes Do you feel safe going back to the place where you live?: Yes      Need for Family Participation in Patient Care: Yes  (Comment) Care giver support system in place?: Yes (comment)   Criminal Activity/Legal Involvement Pertinent to Current Situation/Hospitalization: No - Comment as needed  Activities of Daily Living Home Assistive Devices/Equipment: Cane (specify quad or straight), Walker (specify type) ADL Screening (condition at time of admission) Patient's cognitive ability adequate to safely complete daily activities?: Yes Is the patient deaf or have difficulty hearing?: No Does the patient have difficulty seeing, even when wearing glasses/contacts?: No Does the patient have difficulty concentrating, remembering, or making decisions?: No Patient able to express need for assistance with ADLs?: Yes Does the patient have difficulty dressing or bathing?: No Independently performs ADLs?: Yes (appropriate for developmental age) Does the patient have difficulty walking or climbing stairs?: Yes Weakness of Legs: Both Weakness of Arms/Hands: None  Permission Sought/Granted         Permission granted to share info w AGENCY: [id number]        Emotional Assessment       Orientation: : Oriented to Self, Oriented to Place, Oriented to  Time Alcohol / Substance Use: Not Applicable Psych Involvement: No (comment)  Admission diagnosis:  Pneumonia [J18.9] Community acquired pneumonia of right upper lobe of lung [J18.9] Patient Active Problem List   Diagnosis Date Noted   Severe sepsis with acute organ dysfunction ([place]) 02/16/2021   Right upper lobe pneumonia 02/16/2021   History of CVA (cerebrovascular accident) 02/16/2021   Rib fractures 06/23/2020   Chronic pain syndrome    Hypoalbuminemia due to protein-calorie malnutrition (HCC)    Acute blood loss anemia    Benign prostatic hyperplasia    Vascular headache    Acute on chronic combined systolic  and diastolic heart failure (HCC)    Sleep disturbance    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   AKI (acute kidney injury) ([place])    Left leg  weakness    Ventricular tachyarrhythmia ([place])    Stroke (cerebrum) ([place]) 01/01/2020   Normocytic anemia 06/04/2017   COPD (chronic obstructive pulmonary disease) ([place]) 06/04/2017   Pneumococcal bacteremia 05/28/2017   Pneumococcal pneumonia ([place]) 05/27/2017   IVDU (intravenous drug user)    Liver fibrosis 04/24/2016   Chronic hepatitis C without hepatic coma ([place]) 04/19/2015   Substance abuse ([place]) 04/19/2015   HTN (hypertension) 11/12/2013   Depression with anxiety 11/12/2013   PCP:  Kathyrn Lass, MD Pharmacy:   [address] [place] [address] Phone: [phone] Fax: [phone]  [id number] - [place], [place]. [address] [place] [address] Phone: [phone] Fax: [phone]     Social Determinants of Health (SDOH) Interventions    Readmission Risk Interventions No flowsheet data found.

## 2021-02-19 NOTE — Discharge Summary (Signed)
Travis Salinas YIF:[id number] DOB: 15-Feb-1951 DOA: 02/16/2021  PCP: Kathyrn Lass, MD  Admit date: 02/16/2021  Discharge date: 02/19/2021  Admitted From: Home  Disposition:  Home   Recommendations for Outpatient Follow-up:   Follow up with PCP in 1-2 weeks  PCP Please obtain BMP/CBC, 2 view CXR in 1week,  (see Discharge instructions)   PCP Please follow up on the following pending results: Kindly monitor narcotic and benzodiazepine intake closely, patient at very high risk of overdosing.  Check CBC, CMP, 2 view chest x-ray in 7 to 10 days.   Home Health: PT,OT,RN, SLP Equipment/Devices: has Home o2  Consultations: None  Discharge Condition: Stable    CODE STATUS: Full    Diet Recommendation: Heart Healthy   Diet Order             Diet Heart Room service appropriate? Yes; Fluid consistency: Thin  Diet effective now                    Chief Complaint  Patient presents with   Back Pain     Brief history of present illness from the day of admission and additional interim summary    Travis Salinas Travis Salinas is a 70 y.o. male with medical history significant for COPD, substance use disorder (cocaine), HTN, history of CVA, depression/anxiety, chronic pain who presented to the ED for evaluation of upper back pain, he had Covid 3.5 weeks ago, he was diagnosed now with RUL Pneumonia and admitted.                                                                 Hospital Course      Severe sepsis due to right upper lobe pneumonia causing acute on chronic hypoxic respiratory failure in a patient who uses 2 L nasal cannula oxygen at home - Sepsis physiology has resolved, was treated with azithromycin along with Rocephin and later Unasyn, clinically much improved and close to his baseline, cleared by speech, I think  he has aspirated due to excessive narcotic use at home, strictly counseled not to overuse or abuse his narcotics and sedative medications.  Encouraged to sit up in chair and use flutter valve every 30 minutes while awake for pulmonary toiletry.  Counseled him to gradually titrate down his sedating medications will also request PCP to monitor.  He will be discharged home on 5 more days of antibiotics, home oxygen to continue currently on 3 L and satting around 95 to 97%.  With PCP within a week of discharge.   Acute kidney injury: Secondary to sepsis. Held home losartan, AKI almost resolved.   COPD on 2 L nasal cannula oxygen at home: Stable without evidence of acute exacerbation.  Continue Anora Ellipta and DuoNeb as needed.   History of  CVA: Continue aspirin and atorvastatin.   Hypertension: Continue home regimen.   Depression/anxiety: Continue home Abilify, Prozac, Lamictal, topiramate.  Continue home alprazolam 3 times daily as needed with hold parameters.   Chronic pain: Continue home oxycodone 20 mg 3 times daily as needed with hold parameters.  [place] substance database is reviewed and shows that he is filling oxycodone and alprazolam consistently.   Substance use disorder : UDS positive for cocaine.  Patient states he does not want his wife notified regarding his substance use.   BPH - flomax.   Discharge diagnosis     Principal Problem:   Severe sepsis with acute organ dysfunction (HCC) Active Problems:   HTN (hypertension)   Depression with anxiety   Substance abuse ([place])   COPD (chronic obstructive pulmonary disease) (HCC)   AKI (acute kidney injury) ([place])   Right upper lobe pneumonia   History of CVA (cerebrovascular accident)    Discharge instructions    Discharge Instructions     Discharge instructions   Complete by: As directed    Follow with Primary MD Kathyrn Lass, MD in 7 days   Get CBC, CMP, 2 view Chest X ray -  checked next visit within 1 week by Primary MD     Activity: As tolerated with Full fall precautions use walker/cane & assistance as needed  Disposition Home    Diet: Heart Healthy with aspiration precautions.    Special Instructions: If you have smoked or chewed Tobacco  in the last 2 yrs please stop smoking, stop any regular Alcohol  and or any Recreational drug use.  On your next visit with your primary care physician please Get Medicines reviewed and adjusted.  Please request your Prim.MD to go over all Hospital Tests and Procedure/Radiological results at the follow up, please get all Hospital records sent to your Prim MD by signing hospital release before you go home.  If you experience worsening of your admission symptoms, develop shortness of breath, life threatening emergency, suicidal or homicidal thoughts you must seek medical attention immediately by calling 911 or calling your MD immediately  if symptoms less severe.  You Must read complete instructions/literature along with all the possible adverse reactions/side effects for all the Medicines you take and that have been prescribed to you. Take any new Medicines after you have completely understood and accpet all the possible adverse reactions/side effects.   Do not drive, operate heavy machinery, perform activities at heights, swimming or participation in water activities or provide baby sitting services if your were admitted for syncope or siezures until you have seen by Primary MD or a Neurologist and advised to do so again.  Do not drive when taking Pain medications.  Do not take more than prescribed Pain, Sleep and Anxiety Medications  Wear Seat belts while driving.   Please note  You were cared for by a hospitalist during your hospital stay. If you have any questions about your discharge medications or the care you received while you were in the hospital after you are discharged, you can call the unit and asked to speak with the hospitalist on call if the hospitalist  that took care of you is not available. Once you are discharged, your primary care physician will handle any further medical issues. Please note that NO REFILLS for any discharge medications will be authorized once you are discharged, as it is imperative that you return to your primary care physician (or establish a relationship with a primary care physician if  you do not have one) for your aftercare needs so that they can reassess your need for medications and monitor your lab values.   Increase activity slowly   Complete by: As directed        Discharge Medications   Allergies as of 02/19/2021       Reactions   Propoxyphene Nausea And Vomiting   Ciprofloxacin Nausea Only   Depakote [divalproex Sodium] Other (See Comments)   hallucinations   Amoxicillin Nausea And Vomiting   Tolerated cefazolin        Medication List     STOP taking these medications    alprazolam 2 MG tablet Commonly known as: XANAX   Amantadine HCl 100 MG tablet   Oxycodone HCl 20 MG Tabs   tamsulosin 0.4 MG Caps capsule Commonly known as: FLOMAX       TAKE these medications    amoxicillin-clavulanate 875-125 MG tablet Commonly known as: Augmentin Take 1 tablet by mouth 2 (two) times daily for 5 days.   Anoro Ellipta 62.5-25 MCG/INH Aepb Generic drug: umeclidinium-vilanterol Inhale 1 puff into the lungs daily.   aspirin 81 MG EC tablet Take 1 tablet (81 mg total) by mouth every morning.   atorvastatin 40 MG tablet Commonly known as: LIPITOR TAKE 1 TABLET BY MOUTH ONCE DAILY What changed:  how much to take when to take this   azithromycin 500 MG tablet Commonly known as: Zithromax Take 1 tablet (500 mg total) by mouth daily.   clonazePAM 1 MG tablet Commonly known as: KLONOPIN Take 0.5 mg by mouth 2 (two) times daily as needed for anxiety.   fluticasone 50 MCG/ACT nasal spray Commonly known as: FLONASE USE 1 SPRAY IN EACH NOSTRIL ONCE DAILY   multivitamin with minerals  tablet Take 1 tablet by mouth every morning.   naproxen sodium 220 MG tablet Commonly known as: ALEVE Take 220-440 mg by mouth 2 (two) times daily as needed (pain/headache).   polyethylene glycol 17 g packet Commonly known as: MIRALAX / GLYCOLAX Take 17 g by mouth daily as needed for mild constipation.       ASK your doctor about these medications    Abilify 10 MG tablet Generic drug: ARIPiprazole TAKE 1 TABLET BY MOUTH EVERY MORNING   albuterol 108 (90 Base) MCG/ACT inhaler Commonly known as: VENTOLIN HFA Inhale 2 puffs into the lungs every 4-6 hours as needed   clonazePAM 0.5 MG tablet Commonly known as: KLONOPIN TAKE 1 TABLET BY MOUTH 2 TIMES DAILY AS NEEDED   FLUoxetine 10 MG capsule Commonly known as: PROZAC TAKE 1 CAPSULE BY MOUTH EVERY MORNING   ipratropium-albuterol 0.5-2.5 (3) MG/3ML Soln Commonly known as: DUONEB Take 3 mLs by nebulization every 4 (four) hours as needed (sob).   lamoTRIgine 200 MG tablet Commonly known as: LAMICTAL TAKE 1 TABLET BY MOUTH EVERY MORNING   losartan 50 MG tablet Commonly known as: COZAAR TAKE 1 TABLET (50 MG TOTAL) BY MOUTH DAILY.   metoprolol succinate 25 MG 24 hr tablet Commonly known as: TOPROL-XL TAKE 1 TABLET BY MOUTH ONCE A DAY   Oxycodone HCl 20 MG Tabs Take 1 tablet by mouth 3 times a day as needed for pain. (monthly contract   Follow up with Dr Alfonso Ramus in July 2022)   tamsulosin 0.4 MG Caps capsule Commonly known as: FLOMAX Take 1 capsule (0.4 mg total) by mouth at bedtime.   topiramate 25 MG tablet Commonly known as: TOPAMAX TAKE 1 TABLET BY MOUTH ONCE DAILY  Durable Medical Equipment  (From admission, onward)           Start     Ordered   02/19/21 0923  For home use only DME 3 n 1  Once        02/19/21 9798             Follow-up Information     Kathyrn Lass, MD. Schedule an appointment as soon as possible for a visit in 1 week(s).   Specialty: Family Medicine Contact  information: [place] [address] [phone]         Nahser, Wonda Cheng, MD. Schedule an appointment as soon as possible for a visit in 1 week(s).   Specialty: Cardiology Contact information: [address] [place] [address] [phone]                 Major procedures and Radiology Reports - PLEASE review detailed and final reports thoroughly  -       DG Wrist Complete Right  Result Date: 02/04/2021 CLINICAL DATA:  [place] 3 days ago.  Right wrist pain and swelling. EXAM: RIGHT WRIST - COMPLETE 3+ VIEW COMPARISON:  None. FINDINGS: No fracture or bone lesion. Wrist joints are normally spaced and aligned. No arthropathic changes. There is soft tissue swelling that predominates over the radial aspect of the wrist. IMPRESSION: 1. No fracture or dislocation. 2. Soft tissue swelling. Electronically Signed   By: Lajean Manes M.D.   On: 02/04/2021 16:53   DG Chest Port 1 View  Result Date: 02/17/2021 CLINICAL DATA:  Shortness of breath. EXAM: PORTABLE CHEST 1 VIEW COMPARISON:  Chest x-ray 02/16/2021 and prior chest CT 06/23/2020 FINDINGS: The cardiac silhouette, mediastinal and hilar contours are within normal limits and stable. There is tortuosity and calcification of the thoracic aorta. Stable significant underlying emphysematous changes and pulmonary scarring. Slight increased opacity in the right upper lobe. Could not exclude superimposed right upper lobe infiltrate. No pleural effusions or pulmonary edema. IMPRESSION: 1. Chronic emphysematous changes and pulmonary scarring. 2. Possible superimposed right upper lobe infiltrate. Electronically Signed   By: Marijo Sanes M.D.   On: 02/17/2021 10:16   DG Chest Port 1 View  Result Date: 02/16/2021 CLINICAL DATA:  Questionable sepsis.  Mid upper back pain. EXAM: PORTABLE CHEST 1 VIEW COMPARISON:  Rib x-ray June 23, 2020.  Chest CT June 23, 2020. FINDINGS: There is new infiltrate in the right  upper lobe compared to previous studies. Emphysematous changes are seen in the lungs. The heart, hila, mediastinum are unremarkable. No pneumothorax. No other acute abnormalities. IMPRESSION: New infiltrate in the right upper lobe worrisome for pneumonia given history. Recommend short-term follow-up imaging after treatment to ensure resolution. Electronically Signed   By: Dorise Bullion III M.D   On: 02/16/2021 13:58    Micro Results     Recent Results (from the past 240 hour(s))  Blood Culture (routine x 2)     Status: None (Preliminary result)   Collection Time: 02/16/21  1:00 PM   Specimen: BLOOD  Result Value Ref Range Status   Specimen Description   Final    BLOOD BLOOD LEFT FOREARM Performed at [facility], [address], [place], [place] [address]    Special Requests   Final    Blood Culture adequate volume BOTTLES DRAWN AEROBIC AND ANAEROBIC Performed at [facility], [address] [place], [place] [address]    Culture   Final  NO GROWTH 3 DAYS Performed at [facility], [place] [address] [place], [place] [address]    Report Status PENDING  Incomplete  Blood Culture (routine x 2)     Status: None (Preliminary result)   Collection Time: 02/16/21  1:00 PM   Specimen: BLOOD  Result Value Ref Range Status   Specimen Description   Final    BLOOD BLOOD RIGHT FOREARM Performed at [facility], [address] [place], [place] [address]    Special Requests   Final    Blood Culture adequate volume BOTTLES DRAWN AEROBIC AND ANAEROBIC Performed at [facility], [address] [place], [place] [address]    Culture   Final    NO GROWTH 3 DAYS Performed at [facility], [place] [address], [place], [place] [address]    Report Status PENDING  Incomplete  Urine culture     Status: None   Collection Time: 02/16/21  2:45 PM   Specimen: In/Out Cath Urine  Result Value Ref Range Status    Specimen Description   Final    IN/OUT CATH URINE Performed at [facility], [address], [place], [place] [address]    Special Requests   Final    NONE Performed at [facility], [address], [place], [place] [address]    Culture   Final    NO GROWTH Performed at [facility], [place] [address], [place],  [address]    Report Status 02/17/2021 FINAL  Final  Resp Panel by RT-PCR (Flu A&B, Covid) Nasopharyngeal Swab     Status: None   Collection Time: 02/16/21 11:11 PM   Specimen: Nasopharyngeal Swab; Nasopharyngeal(NP) swabs in vial transport medium  Result Value Ref Range Status   SARS Coronavirus 2 by RT PCR NEGATIVE NEGATIVE Final    Comment: (NOTE) SARS-CoV-2 target nucleic acids are NOT DETECTED.  The SARS-CoV-2 RNA is generally detectable in upper respiratory specimens during the acute phase of infection. The lowest concentration of SARS-CoV-2 viral copies this assay can detect is 138 copies/mL. A negative result does not preclude SARS-Cov-2 infection and should not be used as the sole basis for treatment or other patient management decisions. A negative result may occur with  improper specimen collection/handling, submission of specimen other than nasopharyngeal swab, presence of viral mutation(s) within the areas targeted by this assay, and inadequate number of viral copies(<138 copies/mL). A negative result must be combined with clinical observations, patient history, and epidemiological information. The expected result is Negative.  Fact Sheet for Patients:  EntrepreneurPulse.com.au  Fact Sheet for Healthcare Providers:  IncredibleEmployment.be  This test is no t yet approved or cleared by the [place] FDA and  has been authorized for detection and/or diagnosis of SARS-CoV-2 by FDA under an Emergency Use Authorization (EUA). This EUA will remain  in effect (meaning this  test can be used) for the duration of the COVID-19 declaration under Section 564(b)(1) of the Act, 21 U.S.C.section 360bbb-3(b)(1), unless the authorization is terminated  or revoked sooner.       Influenza A by PCR NEGATIVE NEGATIVE Final   Influenza B by PCR NEGATIVE NEGATIVE Final    Comment: (NOTE) The Xpert Xpress SARS-CoV-2/FLU/RSV plus assay is intended as an aid in the diagnosis of influenza from Nasopharyngeal swab specimens and should not be used as a sole basis for treatment. Nasal washings and aspirates are unacceptable for Xpert Xpress SARS-CoV-2/FLU/RSV testing.  Fact Sheet for Patients: EntrepreneurPulse.com.au  Fact  Sheet for Healthcare Providers: IncredibleEmployment.be  This test is not yet approved or cleared by the [place] and has been authorized for detection and/or diagnosis of SARS-CoV-2 by FDA under an Emergency Use Authorization (EUA). This EUA will remain in effect (meaning this test can be used) for the duration of the COVID-19 declaration under Section 564(b)(1) of the Act, 21 U.S.C. section 360bbb-3(b)(1), unless the authorization is terminated or revoked.  Performed at [facility], [place] [address] [place], [place] [address]   MRSA Next Gen by PCR, Nasal     Status: None   Collection Time: 02/17/21  7:25 AM  Result Value Ref Range Status   MRSA by PCR Next Gen NOT DETECTED NOT DETECTED Final    Comment: (NOTE) The GeneXpert MRSA Assay (FDA approved for NASAL specimens only), is one component of a comprehensive MRSA colonization surveillance program. It is not intended to diagnose MRSA infection nor to guide or monitor treatment for MRSA infections. Test performance is not FDA approved in patients less than 68 years old. Performed at [facility], [place] [address] [place], [place] [address]     Today   [place] today has no headache,no chest abdominal pain,no  new weakness tingling or numbness, feels much better wants to go home today.    Objective   Blood pressure (!) 147/72, pulse 83, temperature 98.2 F (36.8 C), temperature source Axillary, resp. rate 15, height 5\' 10"  (1.778 m), weight 75 kg, SpO2 97 %.   Intake/Output Summary (Last 24 hours) at 02/19/2021 0946 Last data filed at 02/19/2021 0329 Gross per 24 hour  Intake 707.89 ml  Output 650 ml  Net 57.89 ml    Exam  Awake Alert, No new F.N deficits, Normal affect [place].AT,PERRAL Supple Neck,No JVD, No cervical lymphadenopathy appriciated.  Symmetrical Chest wall movement, Good air movement bilaterally, few coarse rales RRR,No Gallops,Rubs or new Murmurs, No Parasternal Heave +ve B.Sounds, Abd Soft, Non tender, No organomegaly appriciated, No rebound -guarding or rigidity. No Cyanosis, Clubbing or edema, No new Rash or bruise   Data Review   CBC w Diff:  Lab Results  Component Value Date   WBC 14.4 (H) 02/19/2021   HGB 10.9 (L) 02/19/2021   HCT 34.9 (L) 02/19/2021   PLT 286 02/19/2021   LYMPHOPCT 14 02/19/2021   MONOPCT 12 02/19/2021   EOSPCT 2 02/19/2021   BASOPCT 0 02/19/2021    CMP:  Lab Results  Component Value Date   NA 137 02/19/2021   NA 142 01/02/2021   K 3.9 02/19/2021   CL 105 02/19/2021   CO2 26 02/19/2021   BUN 9 02/19/2021   BUN 15 01/02/2021   CREATININE 1.01 02/19/2021   CREATININE 1.22 11/25/2016   PROT 6.3 (L) 02/19/2021   PROT 6.3 11/14/2020   ALBUMIN 2.7 (L) 02/19/2021   ALBUMIN 4.3 11/14/2020   BILITOT 0.8 02/19/2021   BILITOT 0.4 11/14/2020   ALKPHOS 147 (H) 02/19/2021   AST 27 02/19/2021   ALT 20 02/19/2021  .   Total Time in preparing paper work, data evaluation and todays exam - 19 minutes  Lala Lund M.D on 02/19/2021 at 9:46 AM  [facility]

## 2021-02-19 NOTE — Progress Notes (Signed)
Physical Therapy Treatment Patient Details Name: Travis Salinas MRN: [id number] DOB: 1951-07-09 Today's Date: 02/19/2021    History of Present Illness Pt is a 70 y/o male admitted secondary to upper back pain and found to have severe sepsis secondary to R UL PNA. PMH including but not limited to COPD, substance use disorder (cocaine), HTN, history of CVA, depression/anxiety and chronic pain.    PT Comments    Patient received in recliner with nasal cannula off and SpO2 78% on room air. Replaced O2 at 4LPM and sats rapidly improved to 99%. He had clearly altered cognition this morning- slow to respond, responses not always on same topic as question PT asked, difficulty with cue following, impulsive. He was able to tell me "if I seem funny, its because those pain medications mess with my head". Needed frequent, concrete but simple cues for safety/technique/navigation with mobility as well as intermittent MinA for balance due to having posterior LOB when trying to avoid housekeeping cart in hallway. VSS on 4LPM with activity. Left up in recliner with RN present and attending, aware of patient status. Did not attempt stairs this morning due to safety concerns but do feel that physically he will be able to do them without too much of an issue once cognition clears.    Follow Up Recommendations  Home health PT;Supervision/Assistance - 24 hour     Equipment Recommendations  None recommended by PT (has RW at home if needed)    Recommendations for Other Services       Precautions / Restrictions Precautions Precautions: Fall Precaution Comments: monitor SpO2 Restrictions Weight Bearing Restrictions: No    Mobility  Bed Mobility               General bed mobility comments: OOB in recliner    Transfers Overall transfer level: Needs assistance Equipment used: Rolling walker (2 wheeled) Transfers: Sit to/from Stand Sit to Stand: Min assist         General transfer comment: on  first attempts, ignored PT cues for hand placement/sequencing/safety and pulled up on RW/kept falling back into chair.  Needed specific hand over hand assist and repeated reminders for correct/safe technique.  Ambulation/Gait Ambulation/Gait assistance: Min assist Gait Distance (Feet): 100 Feet Assistive device: Rolling walker (2 wheeled) Gait Pattern/deviations: Step-through pattern;Decreased stride length;Drifts right/left;Trunk flexed Gait velocity: decreased   General Gait Details: slow gait, again needed simple repeated concrete cues for safety and avoiding obstacles (bumped head on TV after PT literally told him 3 times in a row to mind the TV); had a hard time avoiding obstacles on the left and at one point needed MinA to correct posterior LOB when trying to avoid housekeeping in hallway   Stairs Stairs:  (did not attempt stairs today- safety concerns!!)           Wheelchair Mobility    Modified Rankin (Stroke Patients Only)       Balance Overall balance assessment: Needs assistance Sitting-balance support: Feet supported;No upper extremity supported Sitting balance-Leahy Scale: Good     Standing balance support: During functional activity;No upper extremity supported Standing balance-Leahy Scale: Fair                              Cognition Arousal/Alertness: Awake/alert;Suspect due to medications Behavior During Therapy: Flat affect Overall Cognitive Status: Impaired/Different from baseline Area of Impairment: Safety/judgement;Awareness;Problem solving;Attention;Following commands;Memory  Current Attention Level: Sustained Memory: Decreased short-term memory;Decreased recall of precautions Following Commands: Follows one step commands inconsistently;Follows one step commands with increased time Safety/Judgement: Decreased awareness of safety;Decreased awareness of deficits Awareness: Intellectual Problem Solving: Slow  processing;Requires verbal cues;Difficulty sequencing General Comments: suspect that pain meds were significantly affecting cognition today- patient with delayed and inconsistent, sometimes not related answers and impulsive requiring repeated, simple, concrete cues. No memory/awareness of having taken his O2 off before PT entered room      Exercises      General Comments General comments (skin integrity, edema, etc.): VSS on 4LPM      Pertinent Vitals/Pain Pain Assessment: Faces Faces Pain Scale: No hurt Pain Intervention(s): Limited activity within patient's tolerance;Monitored during session    Home Living                      Prior Function            PT Goals (current goals can now be found in the care plan section) Acute Rehab PT Goals Patient Stated Goal: decrease pain PT Goal Formulation: With patient Time For Goal Achievement: 03/04/21 Potential to Achieve Goals: Good Progress towards PT goals: Progressing toward goals    Frequency    Min 3X/week      PT Plan Current plan remains appropriate    Co-evaluation              AM-PAC PT "6 Clicks" Mobility   Outcome Measure  Help needed turning from your back to your side while in a flat bed without using bedrails?: None Help needed moving from lying on your back to sitting on the side of a flat bed without using bedrails?: None Help needed moving to and from a bed to a chair (including a wheelchair)?: A Little Help needed standing up from a chair using your arms (e.g., wheelchair or bedside chair)?: A Little Help needed to walk in hospital room?: A Little Help needed climbing 3-5 steps with a railing? : A Little 6 Click Score: 20    End of Session Equipment Utilized During Treatment: Oxygen;Gait belt Activity Tolerance: Patient tolerated treatment well Patient left: in chair;with call bell/phone within reach;with nursing/sitter in room Nurse Communication: Mobility status;Other (comment)  (altered cognition/safety possibly suspect due to medications?) PT Visit Diagnosis: Other abnormalities of gait and mobility (R26.89)     Time: 6811-5726 PT Time Calculation (min) (ACUTE ONLY): 29 min  Charges:  $Gait Training: 8-22 mins $Therapeutic Activity: 8-22 mins                    Windell Norfolk, DPT, PN1   Supplemental Physical Therapist [place]    Pager [phone] Acute Rehab Office [phone]

## 2021-02-19 NOTE — Plan of Care (Signed)
  Problem: Coping: Goal: Level of anxiety will decrease Outcome: Progressing   Problem: Pain Managment: Goal: General experience of comfort will improve Outcome: Progressing   Problem: Safety: Goal: Ability to remain free from injury will improve Outcome: Progressing   Problem: Respiratory: Goal: Ability to maintain adequate ventilation will improve Outcome: Progressing

## 2021-02-20 DIAGNOSIS — F3341 Major depressive disorder, recurrent, in partial remission: Secondary | ICD-10-CM | POA: Diagnosis not present

## 2021-02-20 DIAGNOSIS — F341 Dysthymic disorder: Secondary | ICD-10-CM | POA: Diagnosis not present

## 2021-02-20 DIAGNOSIS — F411 Generalized anxiety disorder: Secondary | ICD-10-CM | POA: Diagnosis not present

## 2021-02-20 NOTE — Progress Notes (Signed)
Discharge Progress Report  Patient Details  Name: Travis Salinas MRN: [id number] Date of Birth: 1951-05-08 Referring Provider:   April Manson Pulmonary Rehab Walk Test from 11/21/2020 in [place]  Referring Provider Dr. Chase Caller        Number of Visits: 16  Reason for Discharge:  Patient reached a stable level of exercise. Patient independent in their exercise. Patient has met program and personal goals.  Smoking History:  Social History   Tobacco Use  Smoking Status Former   Packs/day: 1.00   Years: 32.00   Pack years: 32.00   Types: Cigarettes   Start date: 1968   Quit date: 2002   Years since quitting: 20.4  Smokeless Tobacco Never    Diagnosis:  Other emphysema ([place])  ADL UCSD:  Pulmonary Assessment Scores     Row Name 11/17/20 1308 11/21/20 1602       ADL UCSD   ADL Phase Entry --    SOB Score total 35 --         CAT Score      CAT Score 13 --         mMRC Score      mMRC Score -- 2            Initial Exercise Prescription:  Initial Exercise Prescription - 11/21/20 1600       Date of Initial Exercise RX and Referring Provider   Date 11/21/20    Referring Provider Dr. Chase Caller    Expected Discharge Date 01/23/21      NuStep   Level 1    SPM 80    Minutes 30      Prescription Details   Frequency (times per week) 2    Duration Progress to 30 minutes of continuous aerobic without signs/symptoms of physical distress      Intensity   THRR 40-80% of Max Heartrate 61-122    Ratings of Perceived Exertion 11-13    Perceived Dyspnea 0-4      Progression   Progression Continue to progress workloads to maintain intensity without signs/symptoms of physical distress.      Resistance Training   Training Prescription Yes    Weight orange bands    Reps 10-15             Discharge Exercise Prescription (Final Exercise Prescription Changes):  Exercise Prescription Changes - 01/09/21 1400        Response to Exercise   Blood Pressure (Admit) 140/80    Blood Pressure (Exercise) 170/88    Blood Pressure (Exit) 128/84    Heart Rate (Admit) 76 bpm    Heart Rate (Exercise) 91 bpm    Heart Rate (Exit) 75 bpm    Oxygen Saturation (Admit) 94 %    Oxygen Saturation (Exercise) 93 %    Oxygen Saturation (Exit) 96 %    Rating of Perceived Exertion (Exercise) 11    Perceived Dyspnea (Exercise) 1.5    Duration Continue with 30 min of aerobic exercise without signs/symptoms of physical distress.    Intensity THRR unchanged      Progression   Progression Continue to progress workloads to maintain intensity without signs/symptoms of physical distress.      Resistance Training   Training Prescription Yes    Weight orange bands    Reps 10-15    Time 10 Minutes      Recumbant Bike   Level 2.5    Minutes 15    METs  2.5      NuStep   Level 4    SPM 80    Minutes 15    METs 2.2      Home Exercise Plan   Plans to continue exercise at Home (comment)    Frequency Add 2 additional days to program exercise sessions.    Initial Home Exercises Provided 01/09/21             Functional Capacity:  6 Minute Walk     Row Name 11/21/20 1557         6 Minute Walk   Phase Initial     Distance 1420 feet     Walk Time 6 minutes     # of Rest Breaks 0     MPH 2.69     METS 3.73     RPE 11     Perceived Dyspnea  1     VO2 Peak 13.06     Symptoms Yes (comment)     Comments Hip pain rate 3/10     Resting HR 67 bpm     Resting BP 140/68     Resting Oxygen Saturation  96 %     Exercise Oxygen Saturation  during 6 min walk 94 %     Max Ex. HR 86 bpm     Max Ex. BP 164/70     2 Minute Post BP 156/78           Interval HR     1 Minute HR 77     2 Minute HR 78     3 Minute HR 81     4 Minute HR 85     5 Minute HR 86     6 Minute HR 85     2 Minute Post HR 68     Interval Heart Rate? Yes           Interval Oxygen     Interval Oxygen? Yes     Baseline Oxygen Saturation %  96 %     1 Minute Oxygen Saturation % 95 %     1 Minute Liters of Oxygen 0 L     2 Minute Oxygen Saturation % 94 %     2 Minute Liters of Oxygen 0 L     3 Minute Oxygen Saturation % 95 %     3 Minute Liters of Oxygen 0 L     4 Minute Oxygen Saturation % 95 %     4 Minute Liters of Oxygen 0 L     5 Minute Oxygen Saturation % 95 %     5 Minute Liters of Oxygen 0 L     6 Minute Oxygen Saturation % 95 %     6 Minute Liters of Oxygen 0 L     2 Minute Post Oxygen Saturation % 98 %     2 Minute Post Liters of Oxygen 0 L             Psychological, QOL, Others - Outcomes: PHQ 2/9: Depression screen [facility] 2/9 11/17/2020 11/17/2020 02/03/2020 07/21/2018 04/06/2018  Decreased Interest 0 0 _0 Down, Depressed, Hopeless _1 PHQ - 2 Score _2 Altered sleeping 3 - 2 0 1  Tired, decreased energy 3 - _3 Change in appetite 3 - 0 0 0  Feeling bad or failure about yourself  0 - 2 0 1  Trouble concentrating 1 - 0 0 0  Moving slowly or fidgety/restless 3 - _0 Suicidal thoughts 0 - 0 0 0  PHQ-9 Score 14 - _1 Difficult doing work/chores - - Somewhat difficult Somewhat difficult Somewhat difficult  Some recent data might be hidden    Quality of Life:   Personal Goals: Goals established at orientation with interventions provided to work toward goal.  Personal Goals and Risk Factors at Admission - 11/17/20 1116       Core Components/Risk Factors/Patient Goals on Admission    Weight Management Weight Gain;Yes    Expected Outcomes Weight Gain: Understanding of general recommendations for a high calorie, high protein meal plan that promotes weight gain by distributing calorie intake throughout the day with the consumption for 4-5 meals, snacks, and/or supplements;Short Term: Continue to assess and modify interventions until short term weight is achieved;Long Term: Adherence to nutrition and physical activity/exercise program aimed toward attainment of established weight  goal;Understanding recommendations for meals to include 15-35% energy as protein, 25-35% energy from fat, 35-60% energy from carbohydrates, less than 258m of dietary cholesterol, 20-35 gm of total fiber daily;Understanding of distribution of calorie intake throughout the day with the consumption of 4-5 meals/snacks    Improve shortness of breath with ADL's Yes    Intervention Provide education, individualized exercise plan and daily activity instruction to help decrease symptoms of SOB with activities of daily living.    Expected Outcomes Short Term: Improve cardiorespiratory fitness to achieve a reduction of symptoms when performing ADLs;Long Term: Be able to perform more ADLs without symptoms or delay the onset of symptoms    Hypertension Yes    Intervention Provide education on lifestyle modifcations including regular physical activity/exercise, weight management, moderate sodium restriction and increased consumption of fresh fruit, vegetables, and low fat dairy, alcohol moderation, and smoking cessation.;Monitor prescription use compliance.    Expected Outcomes Short Term: Continued assessment and intervention until BP is < 140/911mHG in hypertensive participants. < 130/8037mG in hypertensive participants with diabetes, heart failure or chronic kidney disease.;Long Term: Maintenance of blood pressure at goal levels.    Stress Yes    Intervention Offer individual and/or small group education and counseling on adjustment to heart disease, stress management and health-related lifestyle change. Teach and support self-help strategies.              Personal Goals Discharge:  Goals and Risk Factor Review     Row Name 12/04/20 1415 12/28/20 0901           Core Components/Risk Factors/Patient Goals Review   Personal Goals Review Develop more efficient breathing techniques such as purse lipped breathing and diaphragmatic breathing and practicing self-pacing with activity.;Increase knowledge of  respiratory medications and ability to use respiratory devices properly.;Improve shortness of breath with ADL's Develop more efficient breathing techniques such as purse lipped breathing and diaphragmatic breathing and practicing self-pacing with activity.;Increase knowledge of respiratory medications and ability to use respiratory devices properly.;Improve shortness of breath with ADL's      Review [name] just started the program, has exercised 3 times in pulmonary rehab and is very weak and deconditioned.  He is exercising @ 1.8 mets on the nustep for 30 minutes. He does seem stronger since being in pulmonary rehab.  His balance has improved and his leg strength.  He is doing well with workload increases.      Expected Outcomes See admission goals. See admission goals.  Exercise Goals and Review:  Exercise Goals     Row Name 11/17/20 1100             Exercise Goals   Increase Physical Activity Yes       Intervention Provide advice, education, support and counseling about physical activity/exercise needs.;Develop an individualized exercise prescription for aerobic and resistive training based on initial evaluation findings, risk stratification, comorbidities and participant's personal goals.       Expected Outcomes Short Term: Attend rehab on a regular basis to increase amount of physical activity.;Long Term: Add in home exercise to make exercise part of routine and to increase amount of physical activity.;Long Term: Exercising regularly at least 3-5 days a week.       Increase Strength and Stamina Yes       Intervention Provide advice, education, support and counseling about physical activity/exercise needs.;Develop an individualized exercise prescription for aerobic and resistive training based on initial evaluation findings, risk stratification, comorbidities and participant's personal goals.       Expected Outcomes Short Term: Increase workloads from initial exercise  prescription for resistance, speed, and METs.;Short Term: Perform resistance training exercises routinely during rehab and add in resistance training at home;Long Term: Improve cardiorespiratory fitness, muscular endurance and strength as measured by increased METs and functional capacity (6MWT)       Able to understand and use rate of perceived exertion (RPE) scale Yes       Intervention Provide education and explanation on how to use RPE scale       Expected Outcomes Short Term: Able to use RPE daily in rehab to express subjective intensity level;Long Term:  Able to use RPE to guide intensity level when exercising independently       Able to understand and use Dyspnea scale Yes       Intervention Provide education and explanation on how to use Dyspnea scale       Expected Outcomes Short Term: Able to use Dyspnea scale daily in rehab to express subjective sense of shortness of breath during exertion;Long Term: Able to use Dyspnea scale to guide intensity level when exercising independently       Knowledge and understanding of Target Heart Rate Range (THRR) Yes       Intervention Provide education and explanation of THRR including how the numbers were predicted and where they are located for reference       Expected Outcomes Short Term: Able to state/look up THRR;Short Term: Able to use daily as guideline for intensity in rehab;Long Term: Able to use THRR to govern intensity when exercising independently       Understanding of Exercise Prescription Yes       Intervention Provide education, explanation, and written materials on patient's individual exercise prescription       Expected Outcomes Short Term: Able to explain program exercise prescription;Long Term: Able to explain home exercise prescription to exercise independently                Exercise Goals Re-Evaluation:  Exercise Goals Re-Evaluation     Row Name 12/05/20 1528 01/01/21 1626           Exercise Goal Re-Evaluation    Exercise Goals Review Increase Physical Activity;Increase Strength and Stamina;Able to understand and use rate of perceived exertion (RPE) scale;Able to understand and use Dyspnea scale;Knowledge and understanding of Target Heart Rate Range (THRR);Understanding of Exercise Prescription Increase Physical Activity;Increase Strength and Stamina;Able to understand and use rate of perceived exertion (RPE)  scale;Able to understand and use Dyspnea scale;Knowledge and understanding of Target Heart Rate Range (THRR);Understanding of Exercise Prescription      Comments Patient has completed 4 exercise sessions and has tolerated the Nustep fairly well. Pt has been in program before but has had a stroke since last time. He wants to focus on walking but he is very unsteady on the track even with a rolator. Pt does not want to use a rolator and I am not comfortable with him walking the track right now even with a rolator. We will aim to have him walk the track for a small amount of time with the rolator and see how it tolerates it. He is currently exercising at 2.5 METS on the Nustep. Will continue to monitor and progress as he is able. Octavia Bruckner has completed 11 exercise sessions and has been consistent with workload and MET level increases. We have still not had him walk on the track due to balance concerns but he has progressed to riding a bike for 15 minutes in addition to 15 minutes on the Nustep. He also wanted to work with some hand weights in addition to the resistance bands so we have been using hand weights with him the last week and he has tolerated that well. His goal is to gain weight and also to gain muscle mass. He has succcesfully gained a little weight and he has made progression with resistance training. He is currently exercising at 2.7 METS on the Nustep and 2.6 METS on the bike. Will continue to monitor and progress as he is able.      Expected Outcomes Through exercise at rehab and home, the patient will  decrease shortness of breath with daily activities and will confident in carrying out an exercise regimn at home. Also, have patient understand his balance issues and limitations and agree to use assistive device when needed. Through exercise at rehab and home, the patient will decrease shortness of breath with daily activities and will confident in carrying out an exercise regimn at home. Also, have patient understand his balance issues and limitations and agree to use assistive device when needed.               Nutrition & Weight - Outcomes:  Pre Biometrics - 11/21/20 1557       Pre Biometrics   Grip Strength 30 kg              Nutrition:  Nutrition Therapy & Goals - 12/12/20 1443       Nutrition Therapy   Diet Low Sodium       Personal Nutrition Goals   Nutrition Goal Pt to build a healthy plate including vegetables, fruits, whole grains, and low-fat dairy products in a heart healthy meal plan.    Personal Goal #2 Pt to watch out for sweets and added sugar    Personal Goal #3 Pt to read labels to reduce sugar and sodium      Intervention Plan   Intervention Prescribe, educate and counsel regarding individualized specific dietary modifications aiming towards targeted core components such as weight, hypertension, lipid management, diabetes, heart failure and other comorbidities.;Nutrition handout(s) given to patient.    Expected Outcomes Short Term Goal: Understand basic principles of dietary content, such as calories, fat, sodium, cholesterol and nutrients.;Long Term Goal: Adherence to prescribed nutrition plan.             Nutrition Discharge:   Education Questionnaire Score:  Knowledge Questionnaire Score - 11/17/20  1210       Knowledge Questionnaire Score   Pre Score 15/18             Goals reviewed with patient; copy given to patient.

## 2021-02-20 NOTE — Addendum Note (Signed)
Encounter addended by: Lance Morin, RN on: 02/20/2021 10:09 AM  Actions taken: Clinical Note Signed, Episode resolved

## 2021-02-21 ENCOUNTER — Other Ambulatory Visit (HOSPITAL_COMMUNITY): Payer: Self-pay

## 2021-02-21 LAB — CULTURE, BLOOD (ROUTINE X 2)
Culture: NO GROWTH
Culture: NO GROWTH
Special Requests: ADEQUATE
Special Requests: ADEQUATE

## 2021-02-21 MED ORDER — ARIPIPRAZOLE 10 MG PO TABS
ORAL_TABLET | ORAL | 1 refills | Status: AC
  Filled 2021-02-21: qty 90, 90d supply, fill #0

## 2021-02-21 MED ORDER — AMANTADINE HCL 100 MG PO TABS
ORAL_TABLET | ORAL | 1 refills | Status: AC
  Filled 2021-02-21: qty 180, 90d supply, fill #0

## 2021-02-21 MED ORDER — FLUOXETINE HCL 10 MG PO CAPS
10.0000 mg | ORAL_CAPSULE | Freq: Every day | ORAL | 1 refills | Status: AC
  Filled 2021-02-21: qty 90, 90d supply, fill #0

## 2021-02-22 ENCOUNTER — Other Ambulatory Visit (HOSPITAL_COMMUNITY): Payer: Self-pay

## 2021-02-27 DIAGNOSIS — Z09 Encounter for follow-up examination after completed treatment for conditions other than malignant neoplasm: Secondary | ICD-10-CM | POA: Diagnosis not present

## 2021-02-27 DIAGNOSIS — I10 Essential (primary) hypertension: Secondary | ICD-10-CM | POA: Diagnosis not present

## 2021-03-01 ENCOUNTER — Other Ambulatory Visit: Payer: Self-pay

## 2021-03-01 ENCOUNTER — Other Ambulatory Visit: Payer: Self-pay | Admitting: Family Medicine

## 2021-03-01 ENCOUNTER — Ambulatory Visit
Admission: RE | Admit: 2021-03-01 | Discharge: 2021-03-01 | Disposition: A | Discharge status: Home / Self Care | Payer: Medicare Other | Source: Ambulatory Visit | Attending: Family Medicine | Admitting: Family Medicine

## 2021-03-01 DIAGNOSIS — R918 Other nonspecific abnormal finding of lung field: Secondary | ICD-10-CM | POA: Diagnosis not present

## 2021-03-01 DIAGNOSIS — J189 Pneumonia, unspecified organism: Secondary | ICD-10-CM

## 2021-03-01 DIAGNOSIS — Z8701 Personal history of pneumonia (recurrent): Secondary | ICD-10-CM | POA: Diagnosis not present

## 2021-03-01 DIAGNOSIS — J439 Emphysema, unspecified: Secondary | ICD-10-CM | POA: Diagnosis not present

## 2021-03-01 DIAGNOSIS — J9811 Atelectasis: Secondary | ICD-10-CM | POA: Diagnosis not present

## 2021-03-02 ENCOUNTER — Other Ambulatory Visit (HOSPITAL_COMMUNITY): Payer: Self-pay

## 2021-03-06 ENCOUNTER — Other Ambulatory Visit (HOSPITAL_COMMUNITY): Payer: Self-pay

## 2021-03-14 ENCOUNTER — Other Ambulatory Visit (HOSPITAL_COMMUNITY): Payer: Self-pay

## 2021-03-14 ENCOUNTER — Other Ambulatory Visit: Payer: Self-pay | Admitting: Cardiovascular Disease

## 2021-03-14 MED FILL — Losartan Potassium Tab 50 MG: ORAL | 90 days supply | Qty: 90 | Fill #1 | Status: AC

## 2021-03-14 MED FILL — Lamotrigine Tab 200 MG: ORAL | 90 days supply | Qty: 90 | Fill #0 | Status: AC

## 2021-03-14 MED FILL — Fluoxetine HCl Cap 10 MG: ORAL | 90 days supply | Qty: 90 | Fill #0 | Status: AC

## 2021-03-14 MED FILL — Aripiprazole Tab 10 MG: ORAL | 90 days supply | Qty: 90 | Fill #0 | Status: AC

## 2021-03-15 ENCOUNTER — Other Ambulatory Visit (HOSPITAL_COMMUNITY): Payer: Self-pay

## 2021-03-15 MED ORDER — OXYCODONE HCL 20 MG PO TABS
1.0000 | ORAL_TABLET | Freq: Three times a day (TID) | ORAL | 0 refills | Status: AC
  Filled 2021-03-15: qty 90, 30d supply, fill #0

## 2021-03-16 ENCOUNTER — Other Ambulatory Visit (HOSPITAL_COMMUNITY): Payer: Self-pay

## 2021-03-16 MED ORDER — ATORVASTATIN CALCIUM 40 MG PO TABS
ORAL_TABLET | Freq: Every day | ORAL | 3 refills | Status: AC
  Filled 2021-03-16: qty 90, 90d supply, fill #0

## 2021-03-21 ENCOUNTER — Emergency Department (HOSPITAL_COMMUNITY): Payer: Medicare Other

## 2021-03-21 ENCOUNTER — Inpatient Hospital Stay (HOSPITAL_COMMUNITY): Payer: Medicare Other

## 2021-03-21 ENCOUNTER — Other Ambulatory Visit: Payer: Self-pay

## 2021-03-21 ENCOUNTER — Inpatient Hospital Stay (HOSPITAL_COMMUNITY)
Admission: EM | Admit: 2021-03-21 | Discharge: 2021-04-09 | DRG: 917 | Disposition: E | Payer: Medicare Other | Attending: Pulmonary Disease | Admitting: Pulmonary Disease

## 2021-03-21 DIAGNOSIS — T68XXXA Hypothermia, initial encounter: Secondary | ICD-10-CM | POA: Diagnosis present

## 2021-03-21 DIAGNOSIS — Z8673 Personal history of transient ischemic attack (TIA), and cerebral infarction without residual deficits: Secondary | ICD-10-CM | POA: Diagnosis not present

## 2021-03-21 DIAGNOSIS — T424X1A Poisoning by benzodiazepines, accidental (unintentional), initial encounter: Secondary | ICD-10-CM | POA: Diagnosis present

## 2021-03-21 DIAGNOSIS — T401X1A Poisoning by heroin, accidental (unintentional), initial encounter: Secondary | ICD-10-CM | POA: Diagnosis present

## 2021-03-21 DIAGNOSIS — G928 Other toxic encephalopathy: Secondary | ICD-10-CM | POA: Diagnosis present

## 2021-03-21 DIAGNOSIS — J439 Emphysema, unspecified: Secondary | ICD-10-CM | POA: Diagnosis present

## 2021-03-21 DIAGNOSIS — R579 Shock, unspecified: Secondary | ICD-10-CM | POA: Diagnosis present

## 2021-03-21 DIAGNOSIS — B182 Chronic viral hepatitis C: Secondary | ICD-10-CM | POA: Diagnosis not present

## 2021-03-21 DIAGNOSIS — E872 Acidosis, unspecified: Secondary | ICD-10-CM | POA: Diagnosis present

## 2021-03-21 DIAGNOSIS — E785 Hyperlipidemia, unspecified: Secondary | ICD-10-CM | POA: Diagnosis present

## 2021-03-21 DIAGNOSIS — E7220 Disorder of urea cycle metabolism, unspecified: Secondary | ICD-10-CM | POA: Diagnosis present

## 2021-03-21 DIAGNOSIS — R4020 Unspecified coma: Secondary | ICD-10-CM | POA: Diagnosis not present

## 2021-03-21 DIAGNOSIS — E46 Unspecified protein-calorie malnutrition: Secondary | ICD-10-CM | POA: Diagnosis present

## 2021-03-21 DIAGNOSIS — Z66 Do not resuscitate: Secondary | ICD-10-CM | POA: Diagnosis not present

## 2021-03-21 DIAGNOSIS — G9341 Metabolic encephalopathy: Secondary | ICD-10-CM | POA: Diagnosis present

## 2021-03-21 DIAGNOSIS — R4182 Altered mental status, unspecified: Secondary | ICD-10-CM | POA: Diagnosis not present

## 2021-03-21 DIAGNOSIS — G936 Cerebral edema: Secondary | ICD-10-CM | POA: Diagnosis present

## 2021-03-21 DIAGNOSIS — T50904A Poisoning by unspecified drugs, medicaments and biological substances, undetermined, initial encounter: Secondary | ICD-10-CM | POA: Diagnosis not present

## 2021-03-21 DIAGNOSIS — G931 Anoxic brain damage, not elsewhere classified: Secondary | ICD-10-CM | POA: Diagnosis present

## 2021-03-21 DIAGNOSIS — N179 Acute kidney failure, unspecified: Secondary | ICD-10-CM | POA: Diagnosis present

## 2021-03-21 DIAGNOSIS — J9600 Acute respiratory failure, unspecified whether with hypoxia or hypercapnia: Secondary | ICD-10-CM | POA: Diagnosis present

## 2021-03-21 DIAGNOSIS — T405X1A Poisoning by cocaine, accidental (unintentional), initial encounter: Principal | ICD-10-CM | POA: Diagnosis present

## 2021-03-21 DIAGNOSIS — F319 Bipolar disorder, unspecified: Secondary | ICD-10-CM | POA: Diagnosis present

## 2021-03-21 DIAGNOSIS — Z87891 Personal history of nicotine dependence: Secondary | ICD-10-CM

## 2021-03-21 DIAGNOSIS — K7400 Hepatic fibrosis, unspecified: Secondary | ICD-10-CM | POA: Diagnosis present

## 2021-03-21 DIAGNOSIS — I110 Hypertensive heart disease with heart failure: Secondary | ICD-10-CM | POA: Diagnosis present

## 2021-03-21 DIAGNOSIS — Z20822 Contact with and (suspected) exposure to covid-19: Secondary | ICD-10-CM | POA: Diagnosis present

## 2021-03-21 DIAGNOSIS — J9601 Acute respiratory failure with hypoxia: Secondary | ICD-10-CM | POA: Diagnosis not present

## 2021-03-21 DIAGNOSIS — I468 Cardiac arrest due to other underlying condition: Secondary | ICD-10-CM | POA: Diagnosis present

## 2021-03-21 DIAGNOSIS — N400 Enlarged prostate without lower urinary tract symptoms: Secondary | ICD-10-CM | POA: Diagnosis present

## 2021-03-21 DIAGNOSIS — I499 Cardiac arrhythmia, unspecified: Secondary | ICD-10-CM | POA: Diagnosis not present

## 2021-03-21 DIAGNOSIS — J811 Chronic pulmonary edema: Secondary | ICD-10-CM | POA: Diagnosis not present

## 2021-03-21 DIAGNOSIS — F1410 Cocaine abuse, uncomplicated: Secondary | ICD-10-CM | POA: Diagnosis present

## 2021-03-21 DIAGNOSIS — Z8249 Family history of ischemic heart disease and other diseases of the circulatory system: Secondary | ICD-10-CM

## 2021-03-21 DIAGNOSIS — F418 Other specified anxiety disorders: Secondary | ICD-10-CM | POA: Diagnosis present

## 2021-03-21 DIAGNOSIS — D649 Anemia, unspecified: Secondary | ICD-10-CM | POA: Diagnosis present

## 2021-03-21 DIAGNOSIS — E8809 Other disorders of plasma-protein metabolism, not elsewhere classified: Secondary | ICD-10-CM | POA: Diagnosis present

## 2021-03-21 DIAGNOSIS — R404 Transient alteration of awareness: Secondary | ICD-10-CM | POA: Diagnosis not present

## 2021-03-21 DIAGNOSIS — S0003XA Contusion of scalp, initial encounter: Secondary | ICD-10-CM | POA: Diagnosis not present

## 2021-03-21 DIAGNOSIS — Z79899 Other long term (current) drug therapy: Secondary | ICD-10-CM

## 2021-03-21 DIAGNOSIS — D509 Iron deficiency anemia, unspecified: Secondary | ICD-10-CM | POA: Diagnosis present

## 2021-03-21 DIAGNOSIS — G40901 Epilepsy, unspecified, not intractable, with status epilepticus: Secondary | ICD-10-CM | POA: Diagnosis present

## 2021-03-21 DIAGNOSIS — Z7982 Long term (current) use of aspirin: Secondary | ICD-10-CM

## 2021-03-21 DIAGNOSIS — I469 Cardiac arrest, cause unspecified: Secondary | ICD-10-CM | POA: Diagnosis present

## 2021-03-21 DIAGNOSIS — J449 Chronic obstructive pulmonary disease, unspecified: Secondary | ICD-10-CM | POA: Diagnosis present

## 2021-03-21 DIAGNOSIS — I5042 Chronic combined systolic (congestive) and diastolic (congestive) heart failure: Secondary | ICD-10-CM | POA: Diagnosis present

## 2021-03-21 DIAGNOSIS — T40601A Poisoning by unspecified narcotics, accidental (unintentional), initial encounter: Secondary | ICD-10-CM | POA: Diagnosis present

## 2021-03-21 DIAGNOSIS — Z01818 Encounter for other preprocedural examination: Secondary | ICD-10-CM

## 2021-03-21 DIAGNOSIS — Z823 Family history of stroke: Secondary | ICD-10-CM

## 2021-03-21 DIAGNOSIS — R092 Respiratory arrest: Secondary | ICD-10-CM

## 2021-03-21 DIAGNOSIS — Z7951 Long term (current) use of inhaled steroids: Secondary | ICD-10-CM

## 2021-03-21 DIAGNOSIS — D72829 Elevated white blood cell count, unspecified: Secondary | ICD-10-CM

## 2021-03-21 DIAGNOSIS — G894 Chronic pain syndrome: Secondary | ICD-10-CM | POA: Diagnosis present

## 2021-03-21 LAB — COMPREHENSIVE METABOLIC PANEL
ALT: 42 U/L (ref 0–44)
AST: 79 U/L — ABNORMAL HIGH (ref 15–41)
Albumin: 3.1 g/dL — ABNORMAL LOW (ref 3.5–5.0)
Alkaline Phosphatase: 109 U/L (ref 38–126)
Anion gap: 12 (ref 5–15)
BUN: 24 mg/dL — ABNORMAL HIGH (ref 8–23)
CO2: 21 mmol/L — ABNORMAL LOW (ref 22–32)
Calcium: 8.5 mg/dL — ABNORMAL LOW (ref 8.9–10.3)
Chloride: 108 mmol/L (ref 98–111)
Creatinine, Ser: 2.13 mg/dL — ABNORMAL HIGH (ref 0.61–1.24)
GFR, Estimated: 33 mL/min — ABNORMAL LOW (ref >60)
Glucose, Bld: 130 mg/dL — ABNORMAL HIGH (ref 70–99)
Potassium: 5 mmol/L (ref 3.5–5.1)
Sodium: 141 mmol/L (ref 135–145)
Total Bilirubin: 0.7 mg/dL (ref 0.3–1.2)
Total Protein: 5.8 g/dL — ABNORMAL LOW (ref 6.5–8.1)

## 2021-03-21 LAB — RESP PANEL BY RT-PCR (FLU A&B, COVID) ARPGX2
Influenza A by PCR: NEGATIVE
Influenza B by PCR: NEGATIVE
SARS Coronavirus 2 by RT PCR: NEGATIVE

## 2021-03-21 LAB — LACTIC ACID, PLASMA
Lactic Acid, Venous: 8.7 mmol/L (ref 0.5–1.9)
Lactic Acid, Venous: 5.9 mmol/L (ref 0.5–1.9)

## 2021-03-21 LAB — URINALYSIS, ROUTINE W REFLEX MICROSCOPIC
Bilirubin Urine: NEGATIVE
Glucose, UA: NEGATIVE mg/dL
Hgb urine dipstick: NEGATIVE
Ketones, ur: NEGATIVE mg/dL
Leukocytes,Ua: NEGATIVE
Nitrite: NEGATIVE
Protein, ur: NEGATIVE mg/dL
Specific Gravity, Urine: 1.019 (ref 1.005–1.030)
pH: 5 (ref 5.0–8.0)

## 2021-03-21 LAB — CBC WITH DIFFERENTIAL/PLATELET
Abs Immature Granulocytes: 2.06 10*3/uL — ABNORMAL HIGH (ref 0.00–0.07)
Basophils Absolute: 0.2 10*3/uL — ABNORMAL HIGH (ref 0.0–0.1)
Basophils Relative: 1 %
Eosinophils Absolute: 0.1 10*3/uL (ref 0.0–0.5)
Eosinophils Relative: 0 %
HCT: 39.5 % (ref 39.0–52.0)
Hemoglobin: 10.7 g/dL — ABNORMAL LOW (ref 13.0–17.0)
Immature Granulocytes: 9 %
Lymphocytes Relative: 16 %
Lymphs Abs: 3.6 10*3/uL (ref 0.7–4.0)
MCH: 27.4 pg (ref 26.0–34.0)
MCHC: 27.1 g/dL — ABNORMAL LOW (ref 30.0–36.0)
MCV: 101 fL — ABNORMAL HIGH (ref 80.0–100.0)
Monocytes Absolute: 1.1 10*3/uL — ABNORMAL HIGH (ref 0.1–1.0)
Monocytes Relative: 5 %
Neutro Abs: 15.7 10*3/uL — ABNORMAL HIGH (ref 1.7–7.7)
Neutrophils Relative %: 69 %
Platelets: 257 10*3/uL (ref 150–400)
RBC: 3.91 MIL/uL — ABNORMAL LOW (ref 4.22–5.81)
RDW: 13.3 % (ref 11.5–15.5)
WBC: 22.7 10*3/uL — ABNORMAL HIGH (ref 4.0–10.5)
nRBC: 0.1 % (ref 0.0–0.2)

## 2021-03-21 LAB — GLUCOSE, CAPILLARY
Glucose-Capillary: 125 mg/dL — ABNORMAL HIGH (ref 70–99)
Glucose-Capillary: 119 mg/dL — ABNORMAL HIGH (ref 70–99)

## 2021-03-21 LAB — MAGNESIUM: Magnesium: 2 mg/dL (ref 1.7–2.4)

## 2021-03-21 LAB — PROTIME-INR
INR: 1.4 — ABNORMAL HIGH (ref 0.8–1.2)
Prothrombin Time: 16.9 seconds — ABNORMAL HIGH (ref 11.4–15.2)

## 2021-03-21 LAB — I-STAT ARTERIAL BLOOD GAS, ED
Acid-base deficit: 3 mmol/L — ABNORMAL HIGH (ref 0.0–2.0)
Bicarbonate: 22.3 mmol/L (ref 20.0–28.0)
Calcium, Ion: 1.11 mmol/L — ABNORMAL LOW (ref 1.15–1.40)
HCT: 37 % — ABNORMAL LOW (ref 39.0–52.0)
Hemoglobin: 12.6 g/dL — ABNORMAL LOW (ref 13.0–17.0)
O2 Saturation: 100 %
Patient temperature: 94.7
Potassium: 5.1 mmol/L (ref 3.5–5.1)
Sodium: 139 mmol/L (ref 135–145)
TCO2: 23 mmol/L (ref 22–32)
pCO2 arterial: 34.9 mmHg (ref 32.0–48.0)
pH, Arterial: 7.403 (ref 7.350–7.450)
pO2, Arterial: 538 mmHg — ABNORMAL HIGH (ref 83.0–108.0)

## 2021-03-21 LAB — CBG MONITORING, ED: Glucose-Capillary: 93 mg/dL (ref 70–99)

## 2021-03-21 LAB — D-DIMER, QUANTITATIVE: D-Dimer, Quant: >20 ug/mL-FEU — ABNORMAL HIGH (ref 0.00–0.50)

## 2021-03-21 LAB — PROCALCITONIN: Procalcitonin: 0.2 ng/mL

## 2021-03-21 LAB — AMMONIA: Ammonia: 43 umol/L — ABNORMAL HIGH (ref 9–35)

## 2021-03-21 LAB — TROPONIN I (HIGH SENSITIVITY): Troponin I (High Sensitivity): 35 ng/L — ABNORMAL HIGH (ref <18)

## 2021-03-21 MED ORDER — HEPARIN SODIUM (PORCINE) 5000 UNIT/ML IJ SOLN
5000.0000 [IU] | Freq: Three times a day (TID) | INTRAMUSCULAR | Status: DC
  Administered 2021-03-21 – 2021-03-23 (×5): 5000 [IU] via SUBCUTANEOUS
  Filled 2021-03-21 (×5): qty 1

## 2021-03-21 MED ORDER — MIDAZOLAM HCL 2 MG/2ML IJ SOLN
INTRAMUSCULAR | Status: AC
  Filled 2021-03-21: qty 2

## 2021-03-21 MED ORDER — ETOMIDATE 2 MG/ML IV SOLN
INTRAVENOUS | Status: AC
  Filled 2021-03-21: qty 20

## 2021-03-21 MED ORDER — SODIUM CHLORIDE 0.9 % IV SOLN
3.0000 g | Freq: Three times a day (TID) | INTRAVENOUS | Status: DC
  Filled 2021-03-21 (×2): qty 8

## 2021-03-21 MED ORDER — NOREPINEPHRINE 4 MG/250ML-% IV SOLN
INTRAVENOUS | Status: AC
  Administered 2021-03-21: 8 ug/kg/min
  Filled 2021-03-21: qty 250

## 2021-03-21 MED ORDER — LAMOTRIGINE 100 MG PO TABS
200.0000 mg | ORAL_TABLET | Freq: Every morning | ORAL | Status: DC
  Administered 2021-03-22 – 2021-03-23 (×2): 200 mg
  Filled 2021-03-21 (×2): qty 2

## 2021-03-21 MED ORDER — NOREPINEPHRINE 4 MG/250ML-% IV SOLN: 0.0000 ug/min | INTRAVENOUS | Status: DC

## 2021-03-21 MED ORDER — DOCUSATE SODIUM 100 MG PO CAPS: 100.0000 mg | ORAL_CAPSULE | Freq: Two times a day (BID) | ORAL | Status: DC | PRN

## 2021-03-21 MED ORDER — CHLORHEXIDINE GLUCONATE CLOTH 2 % EX PADS
6.0000 | MEDICATED_PAD | Freq: Every day | CUTANEOUS | Status: DC
  Administered 2021-03-21 – 2021-03-23 (×2): 6 via TOPICAL

## 2021-03-21 MED ORDER — FENTANYL CITRATE (PF) 100 MCG/2ML IJ SOLN
25.0000 ug | INTRAMUSCULAR | Status: DC | PRN
  Administered 2021-03-22: 25 ug via INTRAVENOUS

## 2021-03-21 MED ORDER — SODIUM CHLORIDE 0.9 % IV SOLN
2.0000 g | Freq: Two times a day (BID) | INTRAVENOUS | Status: DC
  Administered 2021-03-21 – 2021-03-22 (×3): 2 g via INTRAVENOUS
  Filled 2021-03-21 (×3): qty 2

## 2021-03-21 MED ORDER — ARIPIPRAZOLE 10 MG PO TABS
10.0000 mg | ORAL_TABLET | Freq: Every morning | ORAL | Status: DC
  Administered 2021-03-22 – 2021-03-23 (×2): 10 mg
  Filled 2021-03-21 (×3): qty 1

## 2021-03-21 MED ORDER — LACTATED RINGERS IV SOLN: INTRAVENOUS | Status: DC

## 2021-03-21 MED ORDER — SODIUM CHLORIDE 0.9% FLUSH
10.0000 mL | Freq: Two times a day (BID) | INTRAVENOUS | Status: DC
Start: 2021-03-21 — End: 2021-03-23
  Administered 2021-03-21 – 2021-03-23 (×4): 10 mL

## 2021-03-21 MED ORDER — SUCCINYLCHOLINE CHLORIDE 200 MG/10ML IV SOSY
PREFILLED_SYRINGE | INTRAVENOUS | Status: AC
  Filled 2021-03-21: qty 10

## 2021-03-21 MED ORDER — FLUOXETINE HCL 10 MG PO CAPS: 10.0000 mg | ORAL_CAPSULE | Freq: Every morning | ORAL | Status: DC

## 2021-03-21 MED ORDER — PROPOFOL 1000 MG/100ML IV EMUL
INTRAVENOUS | Status: AC
  Filled 2021-03-21: qty 100

## 2021-03-21 MED ORDER — EPINEPHRINE HCL 5 MG/250ML IV SOLN IN NS
0.5000 ug/min | INTRAVENOUS | Status: DC
  Administered 2021-03-21: 0.5 ug/min via INTRAVENOUS

## 2021-03-21 MED ORDER — LEVOFLOXACIN IN D5W 750 MG/150ML IV SOLN: 750.0000 mg | Freq: Once | INTRAVENOUS | Status: DC

## 2021-03-21 MED ORDER — POLYETHYLENE GLYCOL 3350 17 G PO PACK: 17.0000 g | PACK | Freq: Every day | ORAL | Status: DC

## 2021-03-21 MED ORDER — LINEZOLID 600 MG/300ML IV SOLN
600.0000 mg | Freq: Two times a day (BID) | INTRAVENOUS | Status: DC
  Administered 2021-03-21 – 2021-03-22 (×3): 600 mg via INTRAVENOUS
  Filled 2021-03-21 (×4): qty 300

## 2021-03-21 MED ORDER — ACETAMINOPHEN 325 MG PO TABS: 650.0000 mg | ORAL_TABLET | ORAL | Status: DC | PRN

## 2021-03-21 MED ORDER — PROPOFOL 1000 MG/100ML IV EMUL
5.0000 ug/kg/min | INTRAVENOUS | Status: DC
  Administered 2021-03-21: 30 ug/kg/min via INTRAVENOUS
  Administered 2021-03-22: 80 ug/kg/min via INTRAVENOUS
  Administered 2021-03-22 (×2): 40 ug/kg/min via INTRAVENOUS
  Administered 2021-03-22: 30 ug/kg/min via INTRAVENOUS
  Administered 2021-03-23 (×4): 70 ug/kg/min via INTRAVENOUS
  Administered 2021-03-23: 60 ug/kg/min via INTRAVENOUS
  Filled 2021-03-21 (×2): qty 100
  Filled 2021-03-21: qty 200
  Filled 2021-03-21 (×7): qty 100

## 2021-03-21 MED ORDER — IPRATROPIUM-ALBUTEROL 0.5-2.5 (3) MG/3ML IN SOLN: 3.0000 mL | RESPIRATORY_TRACT | Status: DC | PRN

## 2021-03-21 MED ORDER — ROCURONIUM BROMIDE 10 MG/ML (PF) SYRINGE
PREFILLED_SYRINGE | INTRAVENOUS | Status: AC
  Filled 2021-03-21: qty 10

## 2021-03-21 MED ORDER — DOCUSATE SODIUM 50 MG/5ML PO LIQD
100.0000 mg | Freq: Two times a day (BID) | ORAL | Status: DC
  Administered 2021-03-21 – 2021-03-23 (×4): 100 mg
  Filled 2021-03-21 (×4): qty 10

## 2021-03-21 MED ORDER — EPINEPHRINE 1 MG/10ML IJ SOSY
PREFILLED_SYRINGE | INTRAMUSCULAR | Status: AC | PRN
  Administered 2021-03-21: 1 mg via INTRAVENOUS

## 2021-03-21 MED ORDER — ATORVASTATIN CALCIUM 10 MG PO TABS
20.0000 mg | ORAL_TABLET | Freq: Every day | ORAL | Status: DC
  Administered 2021-03-22 – 2021-03-23 (×2): 20 mg
  Filled 2021-03-21 (×3): qty 2

## 2021-03-21 MED ORDER — METHYLPREDNISOLONE SODIUM SUCC 125 MG IJ SOLR
125.0000 mg | Freq: Once | INTRAMUSCULAR | Status: DC
  Filled 2021-03-21: qty 2

## 2021-03-21 MED ORDER — FENTANYL CITRATE (PF) 100 MCG/2ML IJ SOLN
25.0000 ug | INTRAMUSCULAR | Status: DC | PRN
  Administered 2021-03-22: 25 ug via INTRAVENOUS
  Filled 2021-03-21 (×2): qty 2

## 2021-03-21 MED ORDER — MIDAZOLAM HCL 5 MG/5ML IJ SOLN
INTRAMUSCULAR | Status: DC | PRN
  Administered 2021-03-21: 2 mg via INTRAVENOUS

## 2021-03-21 MED ORDER — LEVETIRACETAM IN NACL 1000 MG/100ML IV SOLN
1000.0000 mg | Freq: Two times a day (BID) | INTRAVENOUS | Status: DC
  Administered 2021-03-21: 1000 mg via INTRAVENOUS
  Filled 2021-03-21: qty 100

## 2021-03-21 MED ORDER — ARFORMOTEROL TARTRATE 15 MCG/2ML IN NEBU
15.0000 ug | INHALATION_SOLUTION | Freq: Two times a day (BID) | RESPIRATORY_TRACT | Status: DC
  Administered 2021-03-22 – 2021-03-23 (×3): 15 ug via RESPIRATORY_TRACT
  Filled 2021-03-21 (×6): qty 2

## 2021-03-21 MED ORDER — REVEFENACIN 175 MCG/3ML IN SOLN
175.0000 ug | Freq: Every day | RESPIRATORY_TRACT | Status: DC
  Administered 2021-03-22: 175 ug via RESPIRATORY_TRACT
  Filled 2021-03-21 (×2): qty 3

## 2021-03-21 MED ORDER — PANTOPRAZOLE SODIUM 40 MG PO PACK
40.0000 mg | PACK | Freq: Every day | ORAL | Status: DC
  Administered 2021-03-22 – 2021-03-23 (×2): 40 mg
  Filled 2021-03-21 (×3): qty 20

## 2021-03-21 MED ORDER — SODIUM CHLORIDE 0.9 % IV SOLN: INTRAVENOUS | Status: DC | PRN

## 2021-03-21 MED ORDER — ALBUTEROL (5 MG/ML) CONTINUOUS INHALATION SOLN
10.0000 mg/h | INHALATION_SOLUTION | RESPIRATORY_TRACT | Status: AC
  Administered 2021-03-21: 10 mg/h via RESPIRATORY_TRACT
  Filled 2021-03-21: qty 20

## 2021-03-21 MED ORDER — SODIUM CHLORIDE 0.9 % IV BOLUS
1000.0000 mL | Freq: Once | INTRAVENOUS | Status: AC
  Administered 2021-03-21: 1000 mL via INTRAVENOUS

## 2021-03-21 MED ORDER — SODIUM CHLORIDE 0.9% FLUSH
10.0000 mL | INTRAVENOUS | Status: DC | PRN
  Administered 2021-03-22 (×2): 10 mL

## 2021-03-21 MED ORDER — POLYETHYLENE GLYCOL 3350 17 G PO PACK: 17.0000 g | PACK | Freq: Every day | ORAL | Status: DC | PRN

## 2021-03-21 NOTE — Discharge Planning (Signed)
RNCM met with pt ex-wife to provide comfort and answer questions.  TOC following for disposition needs. Cliffton Spradley J. Clydene Laming, [place], [place], [place]

## 2021-03-21 NOTE — Assessment & Plan Note (Signed)
Plan Hold losartan and lopressor Tele  Treat shock

## 2021-03-21 NOTE — Assessment & Plan Note (Addendum)
Rapidly weaning off pressors. Suspect this was vasoplegia from acidosis  Plan Ensure euvolemic  Cont IVFs Hold home antihypertensives  Titrate norepi for MAP > 65 Cycle CEs

## 2021-03-21 NOTE — Assessment & Plan Note (Signed)
Place foley Resume flomax when able

## 2021-03-21 NOTE — Assessment & Plan Note (Addendum)
Etiology not clear. H/o polysubstance abuse so this is highest on ddx. Given underlying COPD hypercarbia also likely contributing factor. Also given he is s/p cardiac arrest can't exclude anoxic brain injury ->initial CT brain neg Plan Ck UDS Ck ammonia  Get EEG PAD protocol RASS 0 to -1 Change Prop to PRN fent  If MS does not improve will need MRI brain at 72 hrs Hold clonazepam and oxy Cont lamictal and prozac

## 2021-03-21 NOTE — Assessment & Plan Note (Addendum)
No evidence of acute exacerbation Plan Scheduled BDs

## 2021-03-21 NOTE — Assessment & Plan Note (Addendum)
S/p cardiac arrest No clear infiltrate on CXR.  Plan Full vent support  PAD protocol Advance ETT 2 cm  Respiratory culture  Empiric unasyn
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
## 2021-03-21 NOTE — Assessment & Plan Note (Signed)
Plan Supportive care Dietary consult

## 2021-03-21 NOTE — Progress Notes (Signed)
EEG completed, results pending. 

## 2021-03-21 NOTE — Progress Notes (Signed)
[facility] Progress Note Patient Name: Travis Salinas DOB: April 09, 1951 MRN: [id number]   Date of Service  04/03/2021  HPI/Events of Note  Patient with intermittent myoclonic jerking. He needs an order for Propofol which he is already on.  eICU Interventions  Propofol ordered, Keppra ordered, cEEG ordered, Neurology consultation ordered.        Frederik Pear 03/29/2021, 9:42 PM

## 2021-03-21 NOTE — Assessment & Plan Note (Signed)
S/p cardiac arrest and CPR Plan Cont treat shock Ensure euvolemic Repeat chemistry

## 2021-03-21 NOTE — ED Notes (Signed)
Help get patient on the cpr pads on the monitor

## 2021-03-21 NOTE — Progress Notes (Signed)
Patient transported to 2M09 from ED without complications. RN at bedside.

## 2021-03-21 NOTE — Assessment & Plan Note (Addendum)
Plan Ensure euvolemia w/ IVFs Strict I&O Serial chems Renal dose meds

## 2021-03-21 NOTE — Consult Note (Addendum)
NEURO HOSPITALIST CONSULT NOTE   Requestig physician: Dr. Erin Fulling  Reason for Consult: Myoclonus after cardiac arrest  History obtained from:  Chart     HPI:                                                                                                                                          Travis Salinas is an 70 y.o. male with a PMHx of COPD, hepatitis C, prior stroke, chronic pain syndrome (on opiate medication) and HTN who presented to [facility] on 713 after cardiac arrest at home. Roommate performed CPR for about 5 minutes prior to arrival of first responders. ACLS was continued for an additional 15 minutes prior to ROSC. Pt rearrested twice more in route. Arrived to the ED unresponsive, being paced and on epi drip with assisted ventilations via BVM. Edison Pace was switched to ETT. Late Wednesday night, intermittent limb jerking was noted, suggestive of post-anoxic myoclonus. Neurology was consulted to further evaluate.   Past Medical History:  Diagnosis Date   Arthritis    Cervical disc herniation    COPD (chronic obstructive pulmonary disease) ([place])    Headache    " due to antibiotics"   Hepatitis C    Hypertension     Past Surgical History:  Procedure Laterality Date   APPLICATION OF A-CELL OF EXTREMITY Left 12/05/2016   Procedure: APPLICATION OF A-CELL OF EXTREMITY;  Surgeon: Loel Lofty Dillingham, DO;  Location: [place];  Service: Plastics;  Laterality: Left;   APPLICATION OF WOUND VAC Left 12/05/2016   Procedure: APPLICATION OF WOUND VAC;  Surgeon: Loel Lofty Dillingham, DO;  Location: [place];  Service: Plastics;  Laterality: Left;   I & D EXTREMITY Left 11/03/2016   Procedure: IRRIGATION AND DEBRIDEMENT EXTREMITY;  Surgeon: Leandrew Koyanagi, MD;  Location: [place];  Service: Orthopedics;  Laterality: Left;   I & D EXTREMITY Left 12/05/2016   Procedure: IRRIGATION AND DEBRIDEMENT EXTREMITY;  Surgeon: Loel Lofty Dillingham, DO;  Location: [place];  Service: Plastics;  Laterality:  Left;   INCISION AND DRAINAGE ABSCESS Right 11/12/2013   Procedure: INCISION AND DRAINAGE AND OPEN PACKING OF RIGHT CALF  ABSCESS;  Surgeon: Earnstine Regal, MD;  [facility];  Service: General;  Laterality: Right;   INCISION AND DRAINAGE OF WOUND Left 01/29/2017   Procedure: IRRIGATION AND DEBRIDEMENT OF LEFT LEG WOUND WITH ABRA PLACEMENT AND PLACEMENT OF WOUND VAC;  Surgeon: Wallace Going, DO;  [facility];  Service: Plastics;  Laterality: Left;   KNEE SURGERY      Family History  Problem Relation Age of Onset   Heart disease Mother    Stroke Mother    Heart disease Father    Stroke Father  Social History:  reports that he quit smoking about 20 years ago. His smoking use included cigarettes. He started smoking about 54 years ago. He has a 32.00 pack-year smoking history. He has never used smokeless tobacco. He reports previous drug use. Drugs: Heroin and Cocaine. He reports that he does not drink alcohol.  Allergies  Allergen Reactions   Propoxyphene Nausea And Vomiting   Ciprofloxacin Nausea Only   Depakote [Divalproex Sodium] Other (See Comments)    hallucinations   Amoxicillin Nausea And Vomiting    Tolerated cefazolin    MEDICATIONS:                                                                                                                     Scheduled:  arformoterol  15 mcg Nebulization BID   ARIPiprazole  10 mg Per Tube q morning   atorvastatin  20 mg Per Tube Daily   Chlorhexidine Gluconate Cloth  6 each Topical Daily   docusate  100 mg Per Tube BID   etomidate       FLUoxetine  10 mg Per Tube q morning   heparin  5,000 Units Subcutaneous Q8H   lamoTRIgine  200 mg Per Tube q morning   methylPREDNISolone (SOLU-MEDROL) injection  125 mg Intravenous Once   pantoprazole sodium  40 mg Per Tube Daily   revefenacin  175 mcg Nebulization Daily   rocuronium bromide       sodium chloride flush  10-40 mL Intracatheter Q12H   succinylcholine        Continuous:  sodium chloride 10 mL/hr at 03/22/21 0100   ceFEPime (MAXIPIME) IV Stopped (03/22/21 0004)   epinephrine Stopped (03/27/2021 1512)   lactated ringers 75 mL/hr at 03/22/21 0100   levETIRAcetam Stopped (03/26/2021 2251)   linezolid (ZYVOX) IV Stopped (03/15/2021 2015)   norepinephrine (LEVOPHED) Adult infusion Stopped (04/04/2021 1620)   propofol (DIPRIVAN) infusion 40 mcg/kg/min (03/22/21 0100)     ROS:                                                                                                                                       Unable to obtain due to AMS.    Blood pressure (!) 166/77, pulse 89, temperature 98.78 F (37.1 C), resp. rate 18, height '5\' 10"'  (1.778 m), weight 67.9 kg, SpO2 99 %.   General Examination:  Physical Exam  HEENT-  /AT   Lungs- Intubated Extremities- No edema  Neurological Examination Mental Status: Propofol sedation at a rate of 30 was stopped prior to examination. 10 minutes after holding propofol, patient without evidence for cortically mediated activity; not following commands, not opening eyes to any stimuli, no volitional movements, no attempts to communicate. GCS 3t.  Cranial Nerves: II: Pupils sluggishly reactive to 2 mm bilaterally. No blink to threat.   III,IV, VI: Weak oculocephalic reflex.  V,VII: No corneal reflexes. Face flaccidly symmetric. Intermittent subtle twitching of right temporalis muscle.  VIII: No response to auditory stimuli IX,X: Overbreathed vent 1x. No cough with inline suctioning. No gag.  XI: Head is midline XII: Unable to assess Motor: Limbs flaccid x 4. No posturing, withdrawal to noxious or volitional movement.  Intermittent twitching of right thigh muscles which worsens with stimulation.  As propofol wears off, bilateral leg jerks were noted intermittently.  Sensory: No movement to noxious x 4.  Deep  Tendon Reflexes: Hypoactive x 4  Plantars: Mute bilaterally  Cerebellar/Gait: Unable to assess   Lab Results: Basic Metabolic Panel: Recent Labs  Lab 03/09/2021 1411 03/14/2021 1519 03/12/2021 1713  NA 141 139  --   K 5.0 5.1  --   CL 108  --   --   CO2 21*  --   --   GLUCOSE 130*  --   --   BUN 24*  --   --   CREATININE 2.13*  --   --   CALCIUM 8.5*  --   --   MG  --   --  2.0    CBC: Recent Labs  Lab 03/13/2021 1411  1519  WBC 22.7*  --   NEUTROABS 15.7*  --   HGB 10.7* 12.6*  HCT 39.5 37.0*  MCV 101.0*  --   PLT 257  --     Cardiac Enzymes: No results for input(s): CKTOTAL, CKMB, CKMBINDEX, TROPONINI in the last 168 hours.  Lipid Panel: No results for input(s): CHOL, TRIG, HDL, CHOLHDL, VLDL, LDLCALC in the last 168 hours.  Imaging: CT Head Wo Contrast  Result Date: 03/27/2021 CLINICAL DATA:  Mental status change. EXAM: CT HEAD WITHOUT CONTRAST TECHNIQUE: Contiguous axial images were obtained from the base of the skull through the vertex without intravenous contrast. COMPARISON:  06/23/2020 FINDINGS: Brain: The ventricles are in the midline without mass effect or shift. No extra-axial fluid collections are identified. No CT findings for acute intracranial process such as hemispheric infarction or intracranial hemorrhage. The gray-white differentiation is maintained. No mass lesions are identified. The brainstem and cerebellum are grossly normal in stable. Vascular: Age advanced vascular calcifications but no aneurysm or hyperdense vessels. Skull: No skull fracture or bone lesions. Sinuses/Orbits: Mucous retention cyst or polyp in the left maxillary sinus. The other paranasal sinuses and mastoid air cells are clear. Other: No scalp lesions or scalp hematoma. IMPRESSION: No acute intracranial findings or mass lesions. Electronically Signed   By: Marijo Sanes M.D.   On: 03/16/2021 15:38   DG Chest Port 1 View  Result Date: 03/09/2021 CLINICAL DATA:  Unresponsive.   Status post CPR.  Intubation. EXAM: PORTABLE CHEST 1 VIEW COMPARISON:  Chest x-ray 03/01/2021 FINDINGS: Heart size is normal. Gastric tube extends off the inferior course the film. Endotracheal tube terminates just above the clavicles approximately 10 cm from the carina. Heart size is normal. Atherosclerotic changes are noted at the aortic arch. Moderate pulmonary vascular congestion is present without frank edema.  IMPRESSION: 1. Endotracheal tube terminates just above the clavicles approximately 10 cm from the carina. 2. Moderate pulmonary vascular congestion without frank edema. 3. Atherosclerosis of the aortic arch. Electronically Signed   By: San Morelle M.D.   On: 04/07/2021 15:19     Assessment: 70 y.o. male with a PMHx of COPD, hepatitis C, prior stroke, chronic pain syndrome (on opiate medication) and HTN who presented to [facility] on 713 after cardiac arrest at home. Roommate performed CPR for about 5 minutes prior to arrival of first responders. ACLS was continued for an additional 15 minutes prior to ROSC. He was unresponsive on arrival to the ED with [facility] airway in place, which was switched to ETT. Late Wednesday night, intermittent limb jerking was noted, suggestive of post-anoxic myoclonus. Neurology was consulted to further evaluate.  1. Exam after 10 minutes of sedation reveals GCS of 3t with intermittent myoclonic jerks of BLE as well as right thigh muscle and temporalis muscle twitching.  2. Overall neurological exam findings and history are most consistent with diffuse anoxic brain injury versus residual sedation versus a combination.  3. Home medications include clonazepam and lamotrigine. Uncertain what the indications for these medications are. He has no seizure disorder listed in Epic.   Recommendations: 1. Ceribell unit applied for EEG monitoring. No myoclonic discharges seen. The tracings are nearly flat with intermittent low amplitude activity.  2. LTM EEG ordered for tomorrow  AM 3. Titrate propofol to resolution of myoclonus and right thigh/temporalis muscle twitching.  4. Continue Lamictal 200 mg daily per tube.  5. eGFR is 33. Maximum dose of Keppra with the patient's renal dysfunction is 750 mg BID. Decreasing Keppra to 750 mg IV BID.  6. Alternative best next anticonvulsant to use for myoclonus in the setting of anoxic brain injury is valproic acid. However, the patient has a listed allergy to this medication (hallucinations). Given that no myoclonic discharges are seen on Ceribell portable EEG , risks of adding VPA are felt to outweigh benefits. Will use propofol titration towards goal of eliminating myoclonic activity.  7. Contact PCP or family to determine if the patient has a history of seizures and if not, to determine the indication for his home Lamictal.   40 minutes spent in the neurological evaluation and management of this critically ill patient.   Electronically signed: Dr. Kerney Elbe 03/31/2021, 9:53 PM

## 2021-03-21 NOTE — H&P (Addendum)
NAME:  Travis Salinas, MRN:  [id number], DOB:  1950/12/09, LOS: 0 ADMISSION DATE:  04/06/2021, CONSULTATION DATE:  7/13 REFERRING MD:  Dr. Sabra Heck, CHIEF COMPLAINT:  post cardiac arrest   History of Present Illness:  Patient is a 70 yo M w/ pertinent PMH of COPD, bipolar, HTN, hepatitis c, hx of prior stroke, chronic pain syndrome (takes oxy) presenting to [facility] on 7/13 post cardiac arrest.  Patient recently admitted to [facility] 1 month ago for CAP. Been in pulm rehab multiple times for his COPD. Patient also has chronic pain syndrome and arthritis which he takes oxy for. He also has history of bipolar disorder on several medications including xanax. History of substance abuse for heroin and cocaine. Last echo 04/2020 EF 60-65%.  ON 7/13, patient woke up earlier in the morning. Went back to his room  for a nap and was found unresponsive at 1255 by his roommate. He was breathing but no pulse. CPR started by roommate. Paramedics arrived and patient not breathing, continued CPR, rhythm asystole. 3 doses of epi given. Pulse returned with a bradycardic rhythm. Placed on temporary transcutaneous pacer and started on epi drip.   ED course 7/13: pulses present but non responsive. King airway in place and switched out for ETT. Fem line placed. Epi drip stopped and started on Levo for hypotension. Pupils 5 mm and symmetrical, non-reactive. Eyelid fasciculations present. Cough/gag reflex present. Breathing spontaneously. Biting on ET tube. Started on propofol. HR 80s and regular. WBC 22,000. Lactic acid over 8.   PCCM consulted for admit to ICU and medical management.   Pertinent  Medical History   Past Medical History:  Diagnosis Date   Arthritis    Cervical disc herniation    COPD (chronic obstructive pulmonary disease) ([place])    Headache    " due to antibiotics"   Hepatitis C    Hypertension      Significant Hospital Events: Including procedures, antibiotic start and stop dates in addition to other  pertinent events   7/13: admitted to [facility] post cardiac arrest; on levo  Interim History / Subjective:  Patient intubated on PRVC 40% +5 On propofol: non responsive; pupil 3 mm bilaterally; sluggish responsive to light; breathing spontaneously; cough gag reflex present Levo at 2; BP stable  Objective   Blood pressure (!) 142/72, pulse 82, temperature (!) 94.5 F (34.7 C), resp. rate (!) 29, height 5\' 10"  (1.778 m), SpO2 100 %.    Vent Mode: PRVC FiO2 (%):  [50 %-100 %] 50 % Set Rate:  [18 bmp] 18 bmp Vt Set:  [580 mL] 580 mL PEEP:  [5 cmH20-8 cmH20] 5 cmH20 Plateau Pressure:  [23 cmH20-26 cmH20] 23 cmH20  No intake or output data in the 24 hours ending 03/23/2021 1610 There were no vitals filed for this visit.  Examination: General:  critically ill appearing male on mech vent HEENT: MM pink/moist; ETT in place Neuro: sedated on propofol; cough/gag reflex present; pupils equal and sluggish to light CV: s1s2, RRR, no m/r/g PULM:  dim clear bs bilaterally; on mech vent prvc 40% 5 peep GI: soft, bsx4 active  Extremities: warm/dry, no edema  Skin: no rashes or lesions   Resolved Hospital Problem list     Assessment & Plan:  S/p cardiac arrest: likely respiratory suppression related due to over medication; UDS pending P: -admit to ICU for telemetry monitoring -EEG ordered -follow CT head results -limit sedation for neuro checks -wean levo for MAP >65 -avoid fevers -IV  fluids -trend troponin and lactate -UDS sent -trend electroyltes  Acute respiratory failure with hypoxia requiring mechanical ventilation COPD Possible aspiration pneumonia P: -continue mech vent support 8cc/kg -wean sedation for RASS 0 to -1 -prn fentanyl for sedation -takes anoro at home: yupelri and brovana ordered -prn duoneb for wheezing -start unasyn for aspiration ppx -trach aspirate ordered  AKI P: -renal dose meds; avoid nephrotoxic agents -IV fluids -trend bmp and uop  Chronic pain  syndrome P: -hold home oxy; prn fentanyl  Bipolar disorder P: -continue abilify, lamictal, prozac -hold topiramate and klonopin  HTN, HLD P:  -restart statin -hold home bp meds  Hepatitis C P: -supportive care -trend lfts -ammonia sent   Best Practice (right click and "Reselect all SmartList Selections" daily)   Diet/type: NPO w/ meds via tube DVT prophylaxis: prophylactic heparin  GI prophylaxis: PPI Lines: Central line Foley:  Yes, and it is still needed Code Status:  full code Last date of multidisciplinary goals of care discussion [pending]  Labs   CBC: Recent Labs  Lab 04/01/2021 1411 04/04/2021 1519  WBC 22.7*  --   NEUTROABS 15.7*  --   HGB 10.7* 12.6*  HCT 39.5 37.0*  MCV 101.0*  --   PLT 257  --     Basic Metabolic Panel: Recent Labs  Lab 03/13/2021 1411 03/28/2021 1519  NA 141 139  K 5.0 5.1  CL 108  --   CO2 21*  --   GLUCOSE 130*  --   BUN 24*  --   CREATININE 2.13*  --   CALCIUM 8.5*  --    GFR: CrCl cannot be calculated (Unknown ideal weight.). Recent Labs  Lab 03/28/2021 1411  WBC 22.7*  LATICACIDVEN 8.7*    Liver Function Tests: Recent Labs  Lab 03/28/2021 1411  AST 79*  ALT 42  ALKPHOS 109  BILITOT 0.7  PROT 5.8*  ALBUMIN 3.1*   No results for input(s): LIPASE, AMYLASE in the last 168 hours. No results for input(s): AMMONIA in the last 168 hours.  ABG    Component Value Date/Time   PHART 7.403 04/07/2021 1519   PCO2ART 34.9 03/30/2021 1519   PO2ART 538 (H) 04/02/2021 1519   HCO3 22.3 03/17/2021 1519   TCO2 23 03/22/2021 1519   ACIDBASEDEF 3.0 (H) 03/22/2021 1519   O2SAT 100.0 03/20/2021 1519     Coagulation Profile: Recent Labs  Lab 03/25/2021 1411  INR 1.4*    Cardiac Enzymes: No results for input(s): CKTOTAL, CKMB, CKMBINDEX, TROPONINI in the last 168 hours.  HbA1C: Hgb A1c MFr Bld  Date/Time Value Ref Range Status  01/02/2020 05:31 PM 5.2 4.8 - 5.6 % Final    Comment:    (NOTE)         Prediabetes: 5.7  - 6.4         Diabetes: >6.4         Glycemic control for adults with diabetes: <7.0   06/14/2017 05:28 AM 5.9 (H) 4.8 - 5.6 % Final    Comment:    (NOTE) Pre diabetes:          5.7%-6.4% Diabetes:              >6.4% Glycemic control for   <7.0% adults with diabetes     CBG: Recent Labs  Lab 04/02/2021 1409  GLUCAP 93    Review of Systems:   Unable to obtain. Patient intubated. Retrieved info from chart and nurse  Past Medical History:  He,  has a  past medical history of Arthritis, Cervical disc herniation, COPD (chronic obstructive pulmonary disease) ([place]), Headache, Hepatitis C, and Hypertension.   Surgical History:   Past Surgical History:  Procedure Laterality Date   APPLICATION OF A-CELL OF EXTREMITY Left 12/05/2016   Procedure: APPLICATION OF A-CELL OF EXTREMITY;  Surgeon: Loel Lofty Dillingham, DO;  Location: [place];  Service: Plastics;  Laterality: Left;   APPLICATION OF WOUND VAC Left 12/05/2016   Procedure: APPLICATION OF WOUND VAC;  Surgeon: Loel Lofty Dillingham, DO;  Location: [place];  Service: Plastics;  Laterality: Left;   I & D EXTREMITY Left 11/03/2016   Procedure: IRRIGATION AND DEBRIDEMENT EXTREMITY;  Surgeon: Leandrew Koyanagi, MD;  Location: [place];  Service: Orthopedics;  Laterality: Left;   I & D EXTREMITY Left 12/05/2016   Procedure: IRRIGATION AND DEBRIDEMENT EXTREMITY;  Surgeon: Loel Lofty Dillingham, DO;  Location: [place];  Service: Plastics;  Laterality: Left;   INCISION AND DRAINAGE ABSCESS Right 11/12/2013   Procedure: INCISION AND DRAINAGE AND OPEN PACKING OF RIGHT CALF  ABSCESS;  Surgeon: Earnstine Regal, MD;  [facility];  Service: General;  Laterality: Right;   INCISION AND DRAINAGE OF WOUND Left 01/29/2017   Procedure: IRRIGATION AND DEBRIDEMENT OF LEFT LEG WOUND WITH ABRA PLACEMENT AND PLACEMENT OF WOUND VAC;  Surgeon: Wallace Going, DO;  [facility];  Service: Plastics;  Laterality: Left;   KNEE SURGERY       Social History:   reports that he  quit smoking about 20 years ago. His smoking use included cigarettes. He started smoking about 54 years ago. He has a 32.00 pack-year smoking history. He has never used smokeless tobacco. He reports previous drug use. Drugs: Heroin and Cocaine. He reports that he does not drink alcohol.   Family History:  His family history includes Heart disease in his father and mother; Stroke in his father and mother.   Allergies Allergies  Allergen Reactions   Propoxyphene Nausea And Vomiting   Ciprofloxacin Nausea Only   Depakote [Divalproex Sodium] Other (See Comments)    hallucinations   Amoxicillin Nausea And Vomiting    Tolerated cefazolin     Home Medications  Prior to Admission medications   Medication Sig Start Date End Date Taking? Authorizing Provider  albuterol (VENTOLIN HFA) 108 (90 Base) MCG/ACT inhaler Inhale 2 puffs into the lungs every 4-6 hours as needed Patient taking differently: Inhale 2 puffs into the lungs every 4 (four) hours as needed for wheezing or shortness of breath. 01/11/21     Amantadine HCl 100 MG tablet Take 1/2 - 1 tablet by mouth twice daily. 02/20/21     ARIPiprazole (ABILIFY) 10 MG tablet TAKE 1 TABLET BY MOUTH EVERY MORNING Patient taking differently: Take 10 mg by mouth every morning. 10/25/20 10/25/21  Pearson Grippe, MD  ARIPiprazole (ABILIFY) 10 MG tablet Take tablet by mouth once daily every morning. 02/20/21     aspirin 81 MG EC tablet Take 1 tablet (81 mg total) by mouth every morning. 02/19/21   Thurnell Lose, MD  atorvastatin (LIPITOR) 40 MG tablet Take 1 tablet by mouth daily. 03/16/21 03/16/22  Richardson Dopp T, PA-C  azithromycin (ZITHROMAX) 500 MG tablet Take 1 tablet (500 mg total) by mouth daily. 02/19/21   Thurnell Lose, MD  clonazePAM (KLONOPIN) 0.5 MG tablet TAKE 1 TABLET BY MOUTH 2 TIMES DAILY AS NEEDED Patient not taking: No sig reported 10/25/20 04/23/21  Pearson Grippe, MD  clonazePAM (KLONOPIN) 1 MG tablet Take  0.5 mg by mouth 2 (two) times daily  as needed for anxiety.    [provider]  FLUoxetine (PROZAC) 10 MG capsule TAKE 1 CAPSULE BY MOUTH EVERY MORNING Patient taking differently: Take 10 mg by mouth every morning. 10/25/20 10/25/21  Pearson Grippe, MD  FLUoxetine (PROZAC) 10 MG capsule Take 1 capsule (10 mg total) by mouth daily every morning. 02/20/21     fluticasone (FLONASE) 50 MCG/ACT nasal spray USE 1 SPRAY IN EACH NOSTRIL ONCE DAILY 05/25/20 05/25/21  Kristen Loader, FNP  ipratropium-albuterol (DUONEB) 0.5-2.5 (3) MG/3ML SOLN Take 3 mLs by nebulization every 4 (four) hours as needed (sob). Patient not taking: No sig reported    [provider]  lamoTRIgine (LAMICTAL) 200 MG tablet TAKE 1 TABLET BY MOUTH EVERY MORNING Patient taking differently: Take 200 mg by mouth every morning. 10/25/20 10/25/21  Pearson Grippe, MD  losartan (COZAAR) 50 MG tablet TAKE 1 TABLET (50 MG TOTAL) BY MOUTH DAILY. Patient taking differently: Take 50 mg by mouth daily. 12/04/20 12/04/21  Richardson Dopp T, PA-C  metoprolol succinate (TOPROL-XL) 25 MG 24 hr tablet TAKE 1 TABLET BY MOUTH ONCE A DAY Patient taking differently: Take 25 mg by mouth daily. 11/01/20 11/01/21  Richardson Dopp T, PA-C  Multiple Vitamins-Minerals (MULTIVITAMIN WITH MINERALS) tablet Take 1 tablet by mouth every morning.    [provider]  naproxen sodium (ALEVE) 220 MG tablet Take 220-440 mg by mouth 2 (two) times daily as needed (pain/headache).    [provider]  Oxycodone HCl 20 MG TABS Take 1 tablet by mouth three times a day as needed for pain 03/15/21     polyethylene glycol (MIRALAX / GLYCOLAX) 17 g packet Take 17 g by mouth daily as needed for mild constipation.    [provider]  tamsulosin (FLOMAX) 0.4 MG CAPS capsule Take 1 capsule (0.4 mg total) by mouth at bedtime. Patient taking differently: Take 0.4 mg by mouth every morning. 01/24/21     topiramate (TOPAMAX) 25 MG tablet TAKE 1 TABLET BY MOUTH ONCE DAILY Patient taking differently:  Take 25 mg by mouth every evening. 01/08/21 01/08/22  Frann Rider, NP  umeclidinium-vilanterol (ANORO ELLIPTA) 62.5-25 MCG/INH AEPB Inhale 1 puff into the lungs daily. 01/25/21 09/22/21  Brand Males, MD  amiodarone (PACERONE) 100 MG tablet TAKE 1 TABLET BY MOUTH DAILY 11/02/20 12/04/20  Nahser, Wonda Cheng, MD  carvedilol (COREG) 3.125 MG tablet Take 1 tablet (3.125 mg total) by mouth 2 (two) times daily with a meal. 01/25/20 11/01/20  Nahser, Wonda Cheng, MD     Critical care time: 44    JD Rexene Agent [facility] 04/01/2021, 4:10 PM  Please see [email or web address] for pager details.  From 7A-7P if no response, please call [phone]. After hours, please call ELink [phone].

## 2021-03-21 NOTE — Assessment & Plan Note (Signed)
Plan eeg Respiratory support  Treat shock  Cycle CEs

## 2021-03-21 NOTE — Assessment & Plan Note (Signed)
paln Cont prozac Cont lamictal

## 2021-03-21 NOTE — Assessment & Plan Note (Signed)
Plan resp culture IV unasyn  PCT

## 2021-03-21 NOTE — Assessment & Plan Note (Signed)
Plan Trend LFTs

## 2021-03-21 NOTE — Assessment & Plan Note (Signed)
Plan PAD protocol

## 2021-03-21 NOTE — Assessment & Plan Note (Signed)
Plan Repeat am LFTs Contact precautions

## 2021-03-21 NOTE — Assessment & Plan Note (Signed)
S/p cardiac arrest Plan Cont supportive care

## 2021-03-21 NOTE — Progress Notes (Signed)
Patient transported to CT and back without complications. RN at bedside.  

## 2021-03-21 NOTE — Assessment & Plan Note (Signed)
Plan Trend cbc  

## 2021-03-21 NOTE — ED Provider Notes (Signed)
[facility] EMERGENCY DEPARTMENT Provider Note   CSN: [id number] Arrival date & time: 04/07/2021  1401     History No chief complaint on file.   Travis Salinas [name] is a 70 y.o. male.  HPI  This patient is a critically ill-appearing 70 year old male with a history of COPD, he has had a history of a prior stroke, he has a chronic pain syndrome, he has a known history of hepatitis C.  He was in his usual state of health this morning according to the paramedics who got the information from the roommate.  He would gone back into his room and a short time later the roommate went into the room and found him unresponsive at 1255.  He was breathing but had no pulses so the CPR was started by the roommate.  The paramedics found the patient to be not breathing, continued CPR, ultimately the patient appeared to be in asystole so they continued CPR, gave 3 doses of epinephrine after which they had a bradycardic rhythm which returned, they placed him on a temporary transcutaneous pacer and started an epi drip.  He arrives at the hospital with pulses, he has mechanical capture, the patient is not able to speak, he is being ventilated with a King airway.  He has an IV in his left anterior tibia intraosseous access.  Level 5 caveat applies due to the critical nature of the illness  Past Medical History:  Diagnosis Date   Arthritis    Cervical disc herniation    COPD (chronic obstructive pulmonary disease) ([place])    Headache    " due to antibiotics"   Hepatitis C    Hypertension     Patient Active Problem List   Diagnosis Date Noted   Severe sepsis with acute organ dysfunction ([place]) 02/16/2021   Right upper lobe pneumonia 02/16/2021   History of CVA (cerebrovascular accident) 02/16/2021   Rib fractures 06/23/2020   Chronic pain syndrome    Hypoalbuminemia due to protein-calorie malnutrition (HCC)    Acute blood loss anemia    Benign prostatic hyperplasia    Vascular headache     Acute on chronic combined systolic and diastolic heart failure (HCC)    Sleep disturbance    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   AKI (acute kidney injury) ([place])    Left leg weakness    Ventricular tachyarrhythmia ([place])    Stroke (cerebrum) ([place]) 01/01/2020   Normocytic anemia 06/04/2017   COPD (chronic obstructive pulmonary disease) ([place]) 06/04/2017   Pneumococcal bacteremia 05/28/2017   Pneumococcal pneumonia ([place]) 05/27/2017   IVDU (intravenous drug user)    Liver fibrosis 04/24/2016   Chronic hepatitis C without hepatic coma ([place]) 04/19/2015   Substance abuse ([place]) 04/19/2015   HTN (hypertension) 11/12/2013   Depression with anxiety 11/12/2013    Past Surgical History:  Procedure Laterality Date   APPLICATION OF A-CELL OF EXTREMITY Left 12/05/2016   Procedure: APPLICATION OF A-CELL OF EXTREMITY;  Surgeon: Loel Lofty Dillingham, DO;  Location: [facility];  Service: Plastics;  Laterality: Left;   APPLICATION OF WOUND VAC Left 12/05/2016   Procedure: APPLICATION OF WOUND VAC;  Surgeon: Loel Lofty Dillingham, DO;  Location: [facility];  Service: Plastics;  Laterality: Left;   I & D EXTREMITY Left 11/03/2016   Procedure: IRRIGATION AND DEBRIDEMENT EXTREMITY;  Surgeon: Leandrew Koyanagi, MD;  Location: [facility];  Service: Orthopedics;  Laterality: Left;   I & D EXTREMITY Left 12/05/2016   Procedure: IRRIGATION AND DEBRIDEMENT  EXTREMITY;  Surgeon: Loel Lofty Dillingham, DO;  Location: [facility];  Service: Plastics;  Laterality: Left;   INCISION AND DRAINAGE ABSCESS Right 11/12/2013   Procedure: INCISION AND DRAINAGE AND OPEN PACKING OF RIGHT CALF  ABSCESS;  Surgeon: Earnstine Regal, MD;  Location: [facility];  Service: General;  Laterality: Right;   INCISION AND DRAINAGE OF WOUND Left 01/29/2017   Procedure: IRRIGATION AND DEBRIDEMENT OF LEFT LEG WOUND WITH ABRA PLACEMENT AND PLACEMENT OF WOUND VAC;  Surgeon: Wallace Going, DO;  Location: [facility];  Service: Plastics;  Laterality: Left;   KNEE SURGERY          Family History  Problem Relation Age of Onset   Heart disease Mother    Stroke Mother    Heart disease Father    Stroke Father     Social History   Tobacco Use   Smoking status: Former    Packs/day: 1.00    Years: 32.00    Pack years: 32.00    Types: Cigarettes    Start date: 1968    Quit date: 2002    Years since quitting: 20.5   Smokeless tobacco: Never  Vaping Use   Vaping Use: Never used  Substance Use Topics   Alcohol use: No    Alcohol/week: 0.0 standard drinks   Drug use: Not Currently    Types: Heroin, Cocaine    Comment: " relapsed once this year " 2018    Home Medications Prior to Admission medications   Medication Sig Start Date End Date Taking? Authorizing Provider  albuterol (VENTOLIN HFA) 108 (90 Base) MCG/ACT inhaler Inhale 2 puffs into the lungs every 4-6 hours as needed Patient taking differently: Inhale 2 puffs into the lungs every 4 (four) hours as needed for wheezing or shortness of breath. 01/11/21     Amantadine HCl 100 MG tablet Take 1/2 - 1 tablet by mouth twice daily. 02/20/21     ARIPiprazole (ABILIFY) 10 MG tablet TAKE 1 TABLET BY MOUTH EVERY MORNING Patient taking differently: Take 10 mg by mouth every morning. 10/25/20 10/25/21  Pearson Grippe, MD  ARIPiprazole (ABILIFY) 10 MG tablet Take tablet by mouth once daily every morning. 02/20/21     aspirin 81 MG EC tablet Take 1 tablet (81 mg total) by mouth every morning. 02/19/21   Thurnell Lose, MD  atorvastatin (LIPITOR) 40 MG tablet Take 1 tablet by mouth daily. 03/16/21 03/16/22  Richardson Dopp T, PA-C  azithromycin (ZITHROMAX) 500 MG tablet Take 1 tablet (500 mg total) by mouth daily. 02/19/21   Thurnell Lose, MD  clonazePAM (KLONOPIN) 0.5 MG tablet TAKE 1 TABLET BY MOUTH 2 TIMES DAILY AS NEEDED Patient not taking: No sig reported 10/25/20 04/23/21  Pearson Grippe, MD  clonazePAM (KLONOPIN) 1 MG tablet Take 0.5 mg by mouth 2 (two) times daily as needed for anxiety.    [provider]  FLUoxetine (PROZAC) 10 MG capsule TAKE 1 CAPSULE BY MOUTH EVERY MORNING Patient taking differently: Take 10 mg by mouth every morning. 10/25/20 10/25/21  Pearson Grippe, MD  FLUoxetine (PROZAC) 10 MG capsule Take 1 capsule (10 mg total) by mouth daily every morning. 02/20/21     fluticasone (FLONASE) 50 MCG/ACT nasal spray USE 1 SPRAY IN EACH NOSTRIL ONCE DAILY 05/25/20 05/25/21  Kristen Loader, FNP  ipratropium-albuterol (DUONEB) 0.5-2.5 (3) MG/3ML SOLN Take 3 mLs by nebulization every 4 (four) hours as needed (sob). Patient not taking: No sig reported    [provider]  lamoTRIgine (LAMICTAL) 200 MG tablet TAKE 1 TABLET BY MOUTH EVERY MORNING Patient taking differently: Take 200 mg by mouth every morning. 10/25/20 10/25/21  Pearson Grippe, MD  losartan (COZAAR) 50 MG tablet TAKE 1 TABLET (50 MG TOTAL) BY MOUTH DAILY. Patient taking differently: Take 50 mg by mouth daily. 12/04/20 12/04/21  Richardson Dopp T, PA-C  metoprolol succinate (TOPROL-XL) 25 MG 24 hr tablet TAKE 1 TABLET BY MOUTH ONCE A DAY Patient taking differently: Take 25 mg by mouth daily. 11/01/20 11/01/21  Richardson Dopp T, PA-C  Multiple Vitamins-Minerals (MULTIVITAMIN WITH MINERALS) tablet Take 1 tablet by mouth every morning.    [provider]  naproxen sodium (ALEVE) 220 MG tablet Take 220-440 mg by mouth 2 (two) times daily as needed (pain/headache).    [provider]  Oxycodone HCl 20 MG TABS Take 1 tablet by mouth three times a day as needed for pain 03/15/21     polyethylene glycol (MIRALAX / GLYCOLAX) 17 g packet Take 17 g by mouth daily as needed for mild constipation.    [provider]  tamsulosin (FLOMAX) 0.4 MG CAPS capsule Take 1 capsule (0.4 mg total) by mouth at bedtime. Patient taking differently: Take 0.4 mg by mouth every morning. 01/24/21     topiramate (TOPAMAX) 25 MG tablet TAKE 1 TABLET BY MOUTH ONCE DAILY Patient taking differently: Take 25 mg by mouth every evening. 01/08/21 01/08/22   Frann Rider, NP  umeclidinium-vilanterol (ANORO ELLIPTA) 62.5-25 MCG/INH AEPB Inhale 1 puff into the lungs daily. 01/25/21 09/22/21  Brand Males, MD  amiodarone (PACERONE) 100 MG tablet TAKE 1 TABLET BY MOUTH DAILY 11/02/20 12/04/20  Nahser, Wonda Cheng, MD  carvedilol (COREG) 3.125 MG tablet Take 1 tablet (3.125 mg total) by mouth 2 (two) times daily with a meal. 01/25/20 11/01/20  Nahser, Wonda Cheng, MD    Allergies    Propoxyphene, Ciprofloxacin, Depakote [divalproex sodium], and Amoxicillin  Review of Systems   Review of Systems  Unable to perform ROS: Patient unresponsive   Physical Exam Updated Vital Signs BP (!) 142/72   Pulse 82   Temp (!) 94.5 F (34.7 C)   Resp (!) 29   Ht 1.778 m (_0 )   SpO2 100%   BMI 23.72 kg/m   Physical Exam Vitals and nursing note reviewed.  Constitutional:      General: He is in acute distress.     Appearance: He is well-developed. He is ill-appearing.  HENT:     Head: Normocephalic and atraumatic.     Mouth/Throat:     Mouth: Mucous membranes are moist.     Pharynx: No oropharyngeal exudate.  Eyes:     General: No scleral icterus.       Right eye: No discharge.        Left eye: No discharge.     Conjunctiva/sclera: Conjunctivae normal.     Comments: Pupils are 5 mm and symmetrical, nonreactive  Neck:     Thyroid: No thyromegaly.     Vascular: No JVD.  Cardiovascular:     Rate and Rhythm: Regular rhythm. Bradycardia present.     Heart sounds: Normal heart sounds. No murmur heard.   No friction rub. No gallop.  Pulmonary:     Effort: No respiratory distress.     Breath sounds: Normal breath sounds. No wheezing or rales.     Comments: With assisted respirations the patient has adequate lung sounds bilaterally, no asymmetry, no rales, he is not breathing by himself Abdominal:  General: Bowel sounds are normal. There is no distension.     Palpations: Abdomen is soft. There is no mass.     Tenderness: There is no abdominal  tenderness.  Musculoskeletal:        General: No tenderness. Normal range of motion.     Cervical back: Normal range of motion and neck supple.  Lymphadenopathy:     Cervical: No cervical adenopathy.  Skin:    General: Skin is warm and dry.     Findings: No erythema or rash.  Neurological:     Comments: GCS of 3    ED Results / Procedures / Treatments   Labs (all labs ordered are listed, but only abnormal results are displayed) Labs Reviewed  COMPREHENSIVE METABOLIC PANEL - Abnormal; Notable for the following components:      Result Value   CO2 21 (*)    Glucose, Bld 130 (*)    BUN 24 (*)    Creatinine, Ser 2.13 (*)    Calcium 8.5 (*)    Total Protein 5.8 (*)    Albumin 3.1 (*)    AST 79 (*)    GFR, Estimated 33 (*)    All other components within normal limits  LACTIC ACID, PLASMA - Abnormal; Notable for the following components:   Lactic Acid, Venous 8.7 (*)    All other components within normal limits  CBC WITH DIFFERENTIAL/PLATELET - Abnormal; Notable for the following components:   WBC 22.7 (*)    RBC 3.91 (*)    Hemoglobin 10.7 (*)    MCV 101.0 (*)    MCHC 27.1 (*)    Neutro Abs 15.7 (*)    Monocytes Absolute 1.1 (*)    Basophils Absolute 0.2 (*)    Abs Immature Granulocytes 2.06 (*)    All other components within normal limits  PROTIME-INR - Abnormal; Notable for the following components:   Prothrombin Time 16.9 (*)    INR 1.4 (*)    All other components within normal limits  URINALYSIS, ROUTINE W REFLEX MICROSCOPIC - Abnormal; Notable for the following components:   Color, Urine AMBER (*)    All other components within normal limits  I-STAT ARTERIAL BLOOD GAS, ED - Abnormal; Notable for the following components:   pO2, Arterial 538 (*)    Acid-base deficit 3.0 (*)    Calcium, Ion 1.11 (*)    HCT 37.0 (*)    Hemoglobin 12.6 (*)    All other components within normal limits  TROPONIN I (HIGH SENSITIVITY) - Abnormal; Notable for the following components:    Troponin I (High Sensitivity) 35 (*)    All other components within normal limits  RESP PANEL BY RT-PCR (FLU A&B, COVID) [id number]  LACTIC ACID, PLASMA  BLOOD GAS, ARTERIAL  CBG MONITORING, ED    EKG EKG Interpretation  Date/Time:  Wednesday March 21 2021 14:07:35 EDT Ventricular Rate:  102 PR Interval:  231 QRS Duration: 125 QT Interval:  391 QTC Calculation: 510 R Axis:   0 Text Interpretation: Sinus tachycardia Prolonged PR interval IVCD, consider atypical RBBB Nonspecific T abnormalities, lateral leads Baseline wander in lead(s) V1 Since last tracing QT has lengthened Abnormal ekg Confirmed by Noemi Chapel [phone]) on 03/23/2021 2:42:33 PM  Radiology DG Chest Port 1 View  Result Date: 03/28/2021 CLINICAL DATA:  Unresponsive.  Status post CPR.  Intubation. EXAM: PORTABLE CHEST 1 VIEW COMPARISON:  Chest x-ray 03/01/2021 FINDINGS: Heart size is normal. Gastric tube extends off the inferior course the film. Endotracheal  tube terminates just above the clavicles approximately 10 cm from the carina. Heart size is normal. Atherosclerotic changes are noted at the aortic arch. Moderate pulmonary vascular congestion is present without frank edema. IMPRESSION: 1. Endotracheal tube terminates just above the clavicles approximately 10 cm from the carina. 2. Moderate pulmonary vascular congestion without frank edema. 3. Atherosclerosis of the aortic arch. Electronically Signed   By: San Morelle M.D.   On: 03/10/2021 15:19    Procedures Procedure Name: Intubation Date/Time: 03/31/2021 2:30 PM Performed by: Noemi Chapel, MD Pre-anesthesia Checklist: Patient identified, Patient being monitored, Emergency Drugs available, Timeout performed and Suction available Oxygen Delivery Method: Non-rebreather mask Preoxygenation: Pre-oxygenation with 100% oxygen Ventilation: Mask ventilation without difficulty Laryngoscope Size: Mac and 4 Tube size: 7.5 mm Number of attempts: 1 Airway Equipment and  Method: Stylet Placement Confirmation: ETT inserted through vocal cords under direct vision, CO2 detector, Breath sounds checked- equal and bilateral and Positive ETCO2 Secured at: 25 cm Tube secured with: ETT holder Dental Injury: Teeth and Oropharynx as per pre-operative assessment  Difficulty Due To: Difficulty was unanticipated Comments:      .Central Line  Date/Time: 03/29/2021 2:31 PM Performed by: Noemi Chapel, MD Authorized by: Noemi Chapel, MD   Consent:    Consent obtained:  Emergent situation Universal protocol:    Immediately prior to procedure, a time out was called: yes     Patient identity confirmed:  Arm band Pre-procedure details:    Indication(s): central venous access, hemodynamic monitoring and insufficient peripheral access     Hand hygiene: Hand hygiene performed prior to insertion     Sterile barrier technique: All elements of maximal sterile technique followed     Skin preparation:  Chlorhexidine   Skin preparation agent: Skin preparation agent completely dried prior to procedure   Sedation:    Sedation type:  None Anesthesia:    Anesthesia method:  None Procedure details:    Location:  L femoral   Patient position:  Supine   Procedural supplies:  Triple lumen   Catheter size:  7 Fr   Landmarks identified: yes     Ultrasound guidance: no     Number of attempts:  1 Post-procedure details:    Post-procedure:  Dressing applied and line sutured   Assessment:  Blood return through all ports and free fluid flow   Procedure completion:  Tolerated Comments:       .Critical Care  Date/Time: 03/31/2021 2:32 PM Performed by: Noemi Chapel, MD Authorized by: Noemi Chapel, MD   Critical care provider statement:    Critical care time (minutes):  35   Critical care time was exclusive of:  Separately billable procedures and treating other patients and teaching time   Critical care was necessary to treat or prevent imminent or life-threatening  deterioration of the following conditions:  Cardiac failure   Critical care was time spent personally by me on the following activities:  Blood draw for specimens, development of treatment plan with patient or surrogate, discussions with consultants, evaluation of patient's response to treatment, examination of patient, obtaining history from patient or surrogate, ordering and performing treatments and interventions, ordering and review of laboratory studies, ordering and review of radiographic studies, pulse oximetry, re-evaluation of patient's condition and review of old charts Comments:         Medications Ordered in ED Medications  rocuronium bromide 100 MG/10ML SOSY (has no administration in time range)  etomidate (AMIDATE) 2 MG/ML injection (has no administration in time range)  succinylcholine (ANECTINE) 200 MG/10ML syringe (has no administration in time range)  EPINEPHrine (ADRENALIN) 5 mg in NS 250 mL (0.02 mg/mL) premix infusion (0 mcg/min Intravenous Paused 03/30/2021 1512)  albuterol (PROVENTIL,VENTOLIN) solution continuous neb (10 mg/hr Nebulization New Bag/Given 03/26/2021 1526)  methylPREDNISolone sodium succinate (SOLU-MEDROL) 125 mg/2 mL injection 125 mg (has no administration in time range)  norepinephrine (LEVOPHED) 4m in 2592mpremix infusion (2 mcg/min Intravenous Rate/Dose Change 03/11/2021 1512)  midazolam (VERSED) 5 MG/5ML injection ( Intravenous Canceled Entry 04/04/2021 1500)  levofloxacin (LEVAQUIN) IVPB 750 mg (has no administration in time range)  EPINEPHrine (ADRENALIN) 1 MG/10ML injection (1 mg Intravenous Given 04/02/2021 1404)  propofol (DIPRIVAN) 1000 MG/100ML infusion (  New Bag/Given 03/25/2021 1520)  sodium chloride 0.9 % bolus 1,000 mL (1,000 mLs Intravenous New Bag/Given 03/27/2021 1523)    ED Course  I have reviewed the triage vital signs and the nursing notes.  Pertinent labs & imaging results that were available during my care of the patient were reviewed by me and  considered in my medical decision making (see chart for details).    MDM Rules/Calculators/A&P                          Ultimately this patient comes from home with CPR, cardiac arrest of unknown etiology.  CPR was finished in the field as the patient had return of spontaneous circulation but did require transvenous pacing.  On arrival to turn the transvenous pacer off, the epinephrine drip was continued, he was also given a single dose of 1 mg of IV epinephrine followed by a Levophed drip and is maintained pulses in the 70s, current blood pressure is 124, he is oxygenating well after I switched the peripheral esophageal airway with an endotracheal airway here.  We will discussed the case with critical care he will likely need ICU admit if he survives however his nature of his illness is quite critical.  Review of the medical record shows that the patient had recently been seen about 1 month ago with a community-acquired pneumonia for which she was admitted to the hospital.  He has been in pulmonary rehab multiple times likely from his underlying COPD.  At this time we will give him albuterol treatments, steroids  I have now spoken with the patient's brother and his ex-wife, the latter who has come to the bedside and states that he does have a history of substance abuse including heroin and cocaine, currently on pain pills and Xanax for chronic arthritis pain.  He was admitted to the hospital recently for pneumonia about a month ago, he is starting to have more responsiveness, he is on a ventilator and starting to be irritated chewing on the tube, requiring some sedation.  His chest x-ray does not show any obvious pneumonia, his white blood cell count is elevated at 22,000, his lactic acid is over 8, antibiotics ordered, ICU paged.  ICU to admit  Final Clinical Impression(s) / ED Diagnoses Final diagnoses:  [facility]iac [facility] ([facility] AKI (acute kidney injury) ([facility]     MiNoemi ChapelMD 04/04/2021 1530

## 2021-03-22 ENCOUNTER — Inpatient Hospital Stay (HOSPITAL_COMMUNITY): Payer: Medicare Other

## 2021-03-22 DIAGNOSIS — I469 Cardiac arrest, cause unspecified: Secondary | ICD-10-CM | POA: Diagnosis not present

## 2021-03-22 DIAGNOSIS — J9601 Acute respiratory failure with hypoxia: Secondary | ICD-10-CM | POA: Diagnosis not present

## 2021-03-22 DIAGNOSIS — N179 Acute kidney failure, unspecified: Secondary | ICD-10-CM | POA: Diagnosis not present

## 2021-03-22 DIAGNOSIS — G9341 Metabolic encephalopathy: Secondary | ICD-10-CM | POA: Diagnosis not present

## 2021-03-22 LAB — CBC
HCT: 35.8 % — ABNORMAL LOW (ref 39.0–52.0)
Hemoglobin: 11 g/dL — ABNORMAL LOW (ref 13.0–17.0)
MCH: 28.2 pg (ref 26.0–34.0)
MCHC: 30.7 g/dL (ref 30.0–36.0)
MCV: 91.8 fL (ref 80.0–100.0)
Platelets: 225 10*3/uL (ref 150–400)
RBC: 3.9 MIL/uL — ABNORMAL LOW (ref 4.22–5.81)
RDW: 13.5 % (ref 11.5–15.5)
WBC: 25.7 10*3/uL — ABNORMAL HIGH (ref 4.0–10.5)
nRBC: 0 % (ref 0.0–0.2)

## 2021-03-22 LAB — RAPID URINE DRUG SCREEN, HOSP PERFORMED
Amphetamines: NOT DETECTED
Barbiturates: NOT DETECTED
Benzodiazepines: POSITIVE — AB
Cocaine: POSITIVE — AB
Opiates: NOT DETECTED
Tetrahydrocannabinol: NOT DETECTED

## 2021-03-22 LAB — ECHOCARDIOGRAM COMPLETE
Area-P 1/2: 4.68 cm2
Height: 70 in
S' Lateral: 3.3 cm
Weight: 2395.08 oz

## 2021-03-22 LAB — PROCALCITONIN: Procalcitonin: 0.33 ng/mL

## 2021-03-22 LAB — BASIC METABOLIC PANEL
Anion gap: 7 (ref 5–15)
BUN: 28 mg/dL — ABNORMAL HIGH (ref 8–23)
CO2: 25 mmol/L (ref 22–32)
Calcium: 8.1 mg/dL — ABNORMAL LOW (ref 8.9–10.3)
Chloride: 108 mmol/L (ref 98–111)
Creatinine, Ser: 1.53 mg/dL — ABNORMAL HIGH (ref 0.61–1.24)
GFR, Estimated: 49 mL/min — ABNORMAL LOW (ref >60)
Glucose, Bld: 120 mg/dL — ABNORMAL HIGH (ref 70–99)
Potassium: 4.2 mmol/L (ref 3.5–5.1)
Sodium: 140 mmol/L (ref 135–145)

## 2021-03-22 LAB — GLUCOSE, CAPILLARY
Glucose-Capillary: 108 mg/dL — ABNORMAL HIGH (ref 70–99)
Glucose-Capillary: 118 mg/dL — ABNORMAL HIGH (ref 70–99)
Glucose-Capillary: 127 mg/dL — ABNORMAL HIGH (ref 70–99)
Glucose-Capillary: 143 mg/dL — ABNORMAL HIGH (ref 70–99)
Glucose-Capillary: 187 mg/dL — ABNORMAL HIGH (ref 70–99)
Glucose-Capillary: 99 mg/dL (ref 70–99)

## 2021-03-22 LAB — LACTIC ACID, PLASMA: Lactic Acid, Venous: 0.7 mmol/L (ref 0.5–1.9)

## 2021-03-22 LAB — MAGNESIUM: Magnesium: 1.9 mg/dL (ref 1.7–2.4)

## 2021-03-22 LAB — TROPONIN I (HIGH SENSITIVITY): Troponin I (High Sensitivity): 126 ng/L (ref <18)

## 2021-03-22 LAB — TRIGLYCERIDES: Triglycerides: 68 mg/dL (ref <150)

## 2021-03-22 MED ORDER — HYDRALAZINE HCL 20 MG/ML IJ SOLN
10.0000 mg | INTRAMUSCULAR | Status: DC | PRN
  Administered 2021-03-22: 40 mg via INTRAVENOUS
  Administered 2021-03-22: 10 mg via INTRAVENOUS
  Administered 2021-03-23 (×2): 40 mg via INTRAVENOUS
  Administered 2021-03-23: 20 mg via INTRAVENOUS
  Filled 2021-03-22 (×2): qty 1
  Filled 2021-03-22: qty 2
  Filled 2021-03-22 (×4): qty 1

## 2021-03-22 MED ORDER — ORAL CARE MOUTH RINSE
15.0000 mL | OROMUCOSAL | Status: DC
  Administered 2021-03-22 – 2021-03-23 (×13): 15 mL via OROMUCOSAL

## 2021-03-22 MED ORDER — CHLORHEXIDINE GLUCONATE 0.12% ORAL RINSE (MEDLINE KIT)
15.0000 mL | Freq: Two times a day (BID) | OROMUCOSAL | Status: DC
  Administered 2021-03-22 – 2021-03-23 (×4): 15 mL via OROMUCOSAL

## 2021-03-22 MED ORDER — LEVETIRACETAM IN NACL 1000 MG/100ML IV SOLN
1000.0000 mg | Freq: Two times a day (BID) | INTRAVENOUS | Status: DC
  Administered 2021-03-22 – 2021-03-23 (×3): 1000 mg via INTRAVENOUS
  Filled 2021-03-22 (×4): qty 100

## 2021-03-22 MED ORDER — LEVETIRACETAM IN NACL 1500 MG/100ML IV SOLN: 1500.0000 mg | Freq: Two times a day (BID) | INTRAVENOUS | Status: DC

## 2021-03-22 MED ORDER — LACTULOSE 10 GM/15ML PO SOLN
10.0000 g | Freq: Three times a day (TID) | ORAL | Status: DC
  Administered 2021-03-22 – 2021-03-23 (×4): 10 g
  Filled 2021-03-22 (×4): qty 15

## 2021-03-22 MED ORDER — SODIUM CHLORIDE 0.9 % IV SOLN: 3000.0000 mg | Freq: Once | INTRAVENOUS | Status: DC

## 2021-03-22 MED ORDER — SODIUM CHLORIDE 0.9 % IV SOLN
750.0000 mg | Freq: Two times a day (BID) | INTRAVENOUS | Status: DC
  Administered 2021-03-22: 750 mg via INTRAVENOUS
  Filled 2021-03-22 (×2): qty 7.5

## 2021-03-22 NOTE — Progress Notes (Signed)
Dr. Erin Fulling talking with ex-wife, Bevely Palmer present in the room and brother Laverna Peace via phone on patients status and plan of care and status of health

## 2021-03-22 NOTE — Progress Notes (Addendum)
Subjective: Was started on Keppra and propofol overnight for suspected myoclonic seizures.  Patient's ex-wife and caretaker at bedside.  ROS: Unable to obtain due to poor mental status  Examination  Vital signs in last 24 hours: Temp:  [94.1 F (34.5 C)-99.5 F (37.5 C)] 99.5 F (37.5 C) (07/14 1000) Pulse Rate:  [73-104] 81 (07/14 1000) Resp:  [2-30] 14 (07/14 1000) BP: (61-188)/(38-113) 140/67 (07/14 1000) SpO2:  [93 %-100 %] 97 % (07/14 1000) FiO2 (%):  [50 %-100 %] 50 % (07/14 0900) Weight:  [67.9 kg] 67.9 kg (07/14 0404)  General: lying in bed, not in apparent distress CVS: pulse-normal rate and rhythm RS: Intubated, initiating breaths on vent Extremities: normal, warm Neuro: On propofol at 40 MCG per hour, doesnt opens eyes to noxious stimuli, does not follow commands, PERRLA, no gaze deviation, corneal reflex absent, cough reflex absent, withdraws to noxious stimuli in bilateral upper extremities, 1/5 movement in bilateral lower extremities, hyporeflexic with mute plantars  Basic Metabolic Panel: Recent Labs  Lab 03/31/2021 1411 03/09/2021 1519 03/16/2021 1713 03/22/21 0335  NA 141 139  --  140  K 5.0 5.1  --  4.2  CL 108  --   --  108  CO2 21*  --   --  25  GLUCOSE 130*  --   --  120*  BUN 24*  --   --  28*  CREATININE 2.13*  --   --  1.53*  CALCIUM 8.5*  --   --  8.1*  MG  --   --  2.0 1.9    CBC: Recent Labs  Lab 04/07/2021 1411 03/13/2021 1519 03/22/21 0335  WBC 22.7*  --  25.7*  NEUTROABS 15.7*  --   --   HGB 10.7* 12.6* 11.0*  HCT 39.5 37.0* 35.8*  MCV 101.0*  --  91.8  PLT 257  --  225     Coagulation Studies: Recent Labs    03/11/2021 1411  LABPROT 16.9*  INR 1.4*    Imaging   CT head without contrast 03/10/2021: No acute abnormality   ASSESSMENT AND PLAN: 70 year old male with history of bipolar disorder on lamotrigine, prior stroke, chronic pain syndrome on opioids who presented after cardiorespiratory arrest.  He was noted to have  intermittent leg jerking concerning for posterior and started on Keppra and propofol.  Cardiorespiratory arrest Suspected anoxic/hypoxic brain injury Suspected postanoxic myoclonic seizures Acute encephalopathy, likely post anoxic AKI Leukocytosis Microcytic anemia Cocaine use disorder Hyperammonemia -LTM EEG continues to show background attenuation  Recommendations -We will reduce propofol to 30 MCG per hour, continue to wean if no seizures noted -Continue Keppra 750 mg twice daily and lamotrigine 200 mg every morning -However, if seizures recur, continue propofol and increase Keppra -Continue LTM EEG to assess for seizures -Plan for MRI brain in another 24 to 48 hours -Prolonged downtime and myoclonic seizures in the first 24 hours can be suggestive of significant brain injury with poor chances of neurological recovery.  However, patient did have cocaine in his urine drug screen, pupils are reacting to light and is withdrawing to noxious stimulation.  Therefore we will plan to slowly wean sedation and obtain MRI brain to help with neuro prognostication -I spoke with patient's ex-wife at bedside and patient's brother and his wife on phone who live in [place].  Family states patient has clearly mentioned in the past that he would not want to be in a long-term course facility for a long time, would not want trach/PEG.  They also want to transition patient to DNR -Plan communicated with critical care team -Management of rest of comorbidities per primary team  CRITICAL CARE Performed by: Lora Havens   Total critical care time: 52minutes  Critical care time was exclusive of separately billable procedures and treating other patients.  Critical care was necessary to treat or prevent imminent or life-threatening deterioration.  Critical care was time spent personally by me on the following activities: development of treatment plan with patient and/or surrogate as well as nursing,  discussions with consultants, evaluation of patient's response to treatment, examination of patient, obtaining history from patient or surrogate, ordering and performing treatments and interventions, ordering and review of laboratory studies, ordering and review of radiographic studies, pulse oximetry and re-evaluation of patient's condition.   Zeb Comfort Epilepsy [facility] For questions after 5pm please refer to AMION to reach the Neurologist on call

## 2021-03-22 NOTE — Progress Notes (Signed)
vLTM EEG started. Neuro notified

## 2021-03-22 NOTE — Progress Notes (Addendum)
NAME:  Travis Salinas, MRN:  [id number], DOB:  Jan 17, 1951, LOS: 1 ADMISSION DATE:  03/17/2021, CONSULTATION DATE:  7/13 REFERRING MD:  Dr. Sabra Heck, CHIEF COMPLAINT:  post cardiac arrest   History of Present Illness:  Patient is a 70 yo M w/ pertinent PMH of COPD, bipolar, HTN, hepatitis c, hx of prior stroke, chronic pain syndrome (takes oxy) presenting to [facility] on 7/13 post cardiac arrest.  Patient recently admitted to [facility] 1 month ago for CAP. Been in pulm rehab multiple times for his COPD. Patient also has chronic pain syndrome and arthritis which he takes oxy for. He also has history of bipolar disorder on several medications including xanax. History of substance abuse for heroin and cocaine. Last echo 04/2020 EF 60-65%.  ON 7/13, patient woke up earlier in the morning. Went back to his room  for a nap and was found unresponsive at 1255 by his roommate. He was breathing but no pulse. CPR started by roommate. Paramedics arrived and patient not breathing, continued CPR, rhythm asystole. 3 doses of epi given. Pulse returned with a bradycardic rhythm. Placed on temporary transcutaneous pacer and started on epi drip.   ED course 7/13: pulses present but non responsive. King airway in place and switched out for ETT. Fem line placed. Epi drip stopped and started on Levo for hypotension. Pupils 5 mm and symmetrical, non-reactive. Eyelid fasciculations present. Cough/gag reflex present. Breathing spontaneously. Biting on ET tube. Started on propofol. HR 80s and regular. WBC 22,000. Lactic acid over 8.   PCCM consulted for admit to ICU and medical management.   Pertinent  Medical History   Past Medical History:  Diagnosis Date   Arthritis    Cervical disc herniation    COPD (chronic obstructive pulmonary disease) ([place])    Headache    " due to antibiotics"   Hepatitis C    Hypertension      Significant Hospital Events: Including procedures, antibiotic start and stop dates in addition to other  pertinent events   7/13: admitted to [facility] post cardiac arrest; weaned off levo; intubated; CT head negative 7/14: EEG in place; Echo pending  Interim History / Subjective:   Intermittent myoclonic jerking overnight: Keppra and propofol ordered; neuro consult sent; EEG in place (likely diffuse anoxic brain injury; no seizures seen).  Patient remains intubated on PRVC RR set at 18; patient not breathing over On propofol and keppra; some mild myoclonic jerking on BLE; Nonresponsive to painful stimuli; no corneal reflex; pupils 2 mm bilaterally with sluggish response to light; no cough/gag reflex BP stable to mildly hypertensive overnight; off levo yesterday Afebrile; elevated WBC Creat improving; UOP -1 L last 24 hours   Objective   Blood pressure 137/66, pulse 76, temperature 99.32 F (37.4 C), resp. rate 18, height 5\' 10"  (1.778 m), weight 67.9 kg, SpO2 99 %.    Vent Mode: PRVC FiO2 (%):  [50 %-100 %] 50 % Set Rate:  [18 bmp] 18 bmp Vt Set:  [580 mL] 580 mL PEEP:  [5 cmH20-8 cmH20] 5 cmH20 Plateau Pressure:  [15 cmH20-26 cmH20] 15 cmH20   Intake/Output Summary (Last 24 hours) at 03/22/2021 0738 Last data filed at 03/22/2021 0700 Gross per 24 hour  Intake 1632.05 ml  Output 1000 ml  Net 632.05 ml   Filed Weights   03/20/2021 2000 03/22/21 0404  Weight: 67.9 kg 67.9 kg    Examination: General:  critically ill appearing male on vent HEENT: MM pink/moist; ETT in place Neuro: propofol  paused for neuro exam: patient not breathing over; some mild myoclonic jerking on BLE; Nonresponsive to painful stimuli; no corneal reflex; pupils 2 mm bilaterally with sluggish response to light; no cough/gag reflex CV: s1s2, RRR, no m/r/g PULM:  dim clear bs bilaterally; on PRVC rate 18 not breathing over vent GI: soft, bsx4 active  Extremities: warm/dry, no edema  Skin: no rashes or lesions  Labs Creat 1.53 from 2.13 WBC 25.7 from 22.7 CXR: ett in better position UDS: positive for cocaine and  benzos Ammonia: 43  Resolved Hospital Problem list     Assessment & Plan:  S/p cardiac arrest: likely respiratory suppression related due to over medication; UDS positive for benzos and cocaine. No documented down time. P: -continuous telemetry monitoring -Bcx2 pending -trend labs -EEG results pending -avoid fevers -continue IV fluids -echo pending  Acute respiratory failure with hypoxia requiring mechanical ventilation COPD Possible aspiration pneumonia P: -continue mech vent support 8cc/kg -wean sedation for RASS 0 to -1 -Went apneic when switched to PSV; decreased RR to 14 on PRVC; consider ABG if still not breathing over vent today -continue propofol; prn fentanyl for sedation -takes anoro at home: yupelri and brovana ordered -prn duoneb for wheezing -stopped unasyn and broadened to cefepime and linezolid; BC pending -trach aspirate pending  Myoclonic jerking: CT head 7/13 negative; EEG ? P: -EEG in place; read pending -CT head negative -consider MRI -neuro consulted -continue propofol and keppra -neuro checks  AKI P: -renal dose meds; avoid nephrotoxic agents -continue IV fluids -trend bmp and uop  Metabolic encephalopathy: CT head negative; EEG results pending; could be anoxic injury Hyperammonemia P: -limit sedation for neuro checks -lactulose ordered  Leukocytosis P: -trend CBC/fever -continue cefepime/linezolid -Bcx2 and trach culture pending  Chronic pain syndrome P: -hold home oxy; prn fentanyl  Bipolar disorder present on admissin P: -continue abilify, lamictal, prozac -hold topiramate and klonopin  Chronic HTN, HLD P:  -restart statin -prn hydralazine -hold home bp meds -avoid beta blockers due to cocaine use  Chronic Hepatitis C P: -supportive care -trend lfts    Best Practice (right click and "Reselect all SmartList Selections" daily)   Diet/type: NPO w/ meds via tube DVT prophylaxis: prophylactic heparin  GI  prophylaxis: PPI Lines: Central line Foley:  Yes, and it is still needed Code Status:  full code Last date of multidisciplinary goals of care discussion [pending]   Critical care time: 27    JD Rexene Agent [facility] 03/22/2021, 7:38 AM  Please see [email or web address] for pager details.  From 7A-7P if no response, please call [phone]. After hours, please call ELink [phone].

## 2021-03-22 NOTE — Progress Notes (Signed)
  Echocardiogram 2D Echocardiogram has been performed.  Fidel Levy 03/22/2021, 10:50 AM

## 2021-03-22 NOTE — Procedures (Signed)
Patient Name: Travis Salinas  MRN: [id number]  Epilepsy Attending: Lora Havens  Referring Physician/Provider: Dr Kerney Elbe Duration: 03/22/2021 0031 to 03/22/2021 7282   Patient history: 70 year old male presented after cardiac arrest.  EEG to evaluate for seizures.   Level of alertness: Comatose   AEDs during EEG study: Propofol   Technical aspects: This EEG was obtained using a 10 lead EEG system positioned circumferentially without any parasagittal coverage (rapid EEG). Computer selected EEG is reviewed as  well as background features and all clinically significant events.   Description: EEG showed continuous generalized background attenuation.  Hyperventilation and photic stimulation were not performed.      ABNORMALITY - Background attenuation, generalized   IMPRESSION: This limited ceribell EEG is suggestive of profound diffuse encephalopathy, nonspecific to etiology but could be secondary to sedation, anoxic/hypoxic brain injury.  No seizures or epileptiform discharges were seen throughout the recording.  If concern for ictal-interictal activity persists, consider traditional EEG.    Darchelle Nunes Barbra Sarks

## 2021-03-22 NOTE — Procedures (Signed)
Patient Name: Travis Salinas  MRN: [id number]  Epilepsy Attending: Lora Havens  Referring Physician/Provider: Andres Labrum, PA Date:03/22/2021 Duration: 22.58 mins  Patient history: 70 year old male presented after cardiac arrest.  EEG to evaluate for seizures.  Level of alertness: Comatose  AEDs during EEG study: Propofol  Technical aspects: This EEG study was done with scalp electrodes positioned according to the 10-20 International system of electrode placement. Electrical activity was acquired at a sampling rate of 500Hz  and reviewed with a high frequency filter of 70Hz  and a low frequency filter of 1Hz . EEG data were recorded continuously and digitally stored.   Description: EEG showed continuous generalized background attenuation.  EEG was not reactive to noxious stimulation.  Hyperventilation and photic stimulation were not performed.     ABNORMALITY - Background attenuation, generalized  IMPRESSION: This study is suggestive of profound diffuse encephalopathy, nonspecific to etiology but could be secondary to sedation, anoxic/hypoxic brain injury.  No seizures or epileptiform discharges were seen throughout the recording.  Travis Salinas Travis Salinas

## 2021-03-23 ENCOUNTER — Inpatient Hospital Stay (HOSPITAL_COMMUNITY): Payer: Medicare Other

## 2021-03-23 DIAGNOSIS — G9341 Metabolic encephalopathy: Secondary | ICD-10-CM | POA: Diagnosis not present

## 2021-03-23 DIAGNOSIS — I469 Cardiac arrest, cause unspecified: Secondary | ICD-10-CM | POA: Diagnosis not present

## 2021-03-23 DIAGNOSIS — J9601 Acute respiratory failure with hypoxia: Secondary | ICD-10-CM | POA: Diagnosis not present

## 2021-03-23 LAB — CBC
HCT: 36.9 % — ABNORMAL LOW (ref 39.0–52.0)
Hemoglobin: 11.4 g/dL — ABNORMAL LOW (ref 13.0–17.0)
MCH: 27.9 pg (ref 26.0–34.0)
MCHC: 30.9 g/dL (ref 30.0–36.0)
MCV: 90.4 fL (ref 80.0–100.0)
Platelets: 203 10*3/uL (ref 150–400)
RBC: 4.08 MIL/uL — ABNORMAL LOW (ref 4.22–5.81)
RDW: 13.8 % (ref 11.5–15.5)
WBC: 26.6 10*3/uL — ABNORMAL HIGH (ref 4.0–10.5)
nRBC: 0 % (ref 0.0–0.2)

## 2021-03-23 LAB — MAGNESIUM: Magnesium: 2.1 mg/dL (ref 1.7–2.4)

## 2021-03-23 LAB — GLUCOSE, CAPILLARY
Glucose-Capillary: 146 mg/dL — ABNORMAL HIGH (ref 70–99)
Glucose-Capillary: 126 mg/dL — ABNORMAL HIGH (ref 70–99)
Glucose-Capillary: 123 mg/dL — ABNORMAL HIGH (ref 70–99)

## 2021-03-23 LAB — BASIC METABOLIC PANEL
Anion gap: 7 (ref 5–15)
BUN: 22 mg/dL (ref 8–23)
CO2: 24 mmol/L (ref 22–32)
Calcium: 8.7 mg/dL — ABNORMAL LOW (ref 8.9–10.3)
Chloride: 108 mmol/L (ref 98–111)
Creatinine, Ser: 1.06 mg/dL (ref 0.61–1.24)
GFR, Estimated: >60 mL/min (ref >60)
Glucose, Bld: 138 mg/dL — ABNORMAL HIGH (ref 70–99)
Potassium: 3.6 mmol/L (ref 3.5–5.1)
Sodium: 139 mmol/L (ref 135–145)

## 2021-03-23 LAB — PROCALCITONIN: Procalcitonin: 0.13 ng/mL

## 2021-03-23 LAB — TRIGLYCERIDES: Triglycerides: 72 mg/dL (ref <150)

## 2021-03-23 LAB — TROPONIN I (HIGH SENSITIVITY)
Troponin I (High Sensitivity): 57 ng/L — ABNORMAL HIGH (ref <18)
Troponin I (High Sensitivity): 66 ng/L — ABNORMAL HIGH (ref <18)

## 2021-03-23 MED ORDER — HYDRALAZINE HCL 20 MG/ML IJ SOLN
10.0000 mg | INTRAMUSCULAR | Status: DC | PRN
  Administered 2021-03-23: 10 mg via INTRAVENOUS
  Filled 2021-03-23: qty 1

## 2021-03-23 MED ORDER — MORPHINE BOLUS VIA INFUSION
5.0000 mg | INTRAVENOUS | Status: DC | PRN
  Administered 2021-03-23 (×8): 5 mg via INTRAVENOUS
  Filled 2021-03-23: qty 5

## 2021-03-23 MED ORDER — GLYCOPYRROLATE 1 MG PO TABS: 1.0000 mg | ORAL_TABLET | ORAL | Status: DC | PRN

## 2021-03-23 MED ORDER — GLYCOPYRROLATE 0.2 MG/ML IJ SOLN
0.2000 mg | INTRAMUSCULAR | Status: DC | PRN
  Administered 2021-03-23: 0.2 mg via INTRAVENOUS
  Filled 2021-03-23: qty 1

## 2021-03-23 MED ORDER — GLYCOPYRROLATE 0.2 MG/ML IJ SOLN: 0.2000 mg | INTRAMUSCULAR | Status: DC | PRN

## 2021-03-23 MED ORDER — SODIUM CHLORIDE 0.9 % IV SOLN
2.0000 g | Freq: Three times a day (TID) | INTRAVENOUS | Status: DC
  Administered 2021-03-23 (×2): 2 g via INTRAVENOUS
  Filled 2021-03-23 (×2): qty 2

## 2021-03-23 MED ORDER — MORPHINE 100MG IN NS 100ML (1MG/ML) PREMIX INFUSION
0.0000 mg/h | INTRAVENOUS | Status: DC
  Administered 2021-03-23: 5 mg/h via INTRAVENOUS
  Administered 2021-03-23: 20 mg/h via INTRAVENOUS
  Filled 2021-03-23 (×2): qty 100

## 2021-03-23 MED ORDER — POLYVINYL ALCOHOL 1.4 % OP SOLN
1.0000 [drp] | Freq: Four times a day (QID) | OPHTHALMIC | Status: DC | PRN
  Administered 2021-03-23: 1 [drp] via OPHTHALMIC
  Filled 2021-03-23: qty 15

## 2021-03-23 MED ORDER — POTASSIUM CHLORIDE 20 MEQ PO PACK
40.0000 meq | PACK | Freq: Once | ORAL | Status: AC
  Administered 2021-03-23: 40 meq
  Filled 2021-03-23: qty 2

## 2021-03-23 MED ORDER — HYDRALAZINE HCL 10 MG PO TABS
10.0000 mg | ORAL_TABLET | Freq: Three times a day (TID) | ORAL | Status: DC
  Administered 2021-03-23 (×2): 10 mg
  Filled 2021-03-23 (×2): qty 1

## 2021-03-23 MED ORDER — DEXTROSE 5 % IV SOLN: INTRAVENOUS | Status: DC

## 2021-03-23 MED ORDER — ACETAMINOPHEN 325 MG PO TABS: 650.0000 mg | ORAL_TABLET | Freq: Four times a day (QID) | ORAL | Status: DC | PRN

## 2021-03-23 MED ORDER — DIPHENHYDRAMINE HCL 50 MG/ML IJ SOLN: 25.0000 mg | INTRAMUSCULAR | Status: DC | PRN

## 2021-03-23 MED ORDER — MORPHINE SULFATE (PF) 2 MG/ML IV SOLN
2.0000 mg | INTRAVENOUS | Status: AC | PRN
  Administered 2021-03-23 (×3): 2 mg via INTRAVENOUS

## 2021-03-23 MED ORDER — ACETAMINOPHEN 650 MG RE SUPP: 650.0000 mg | Freq: Four times a day (QID) | RECTAL | Status: DC | PRN

## 2021-03-23 NOTE — Progress Notes (Signed)
PHARMACY NOTE:  ANTIMICROBIAL RENAL DOSAGE ADJUSTMENT  Current antimicrobial regimen includes a mismatch between antimicrobial dosage and estimated renal function.  As per policy approved by the Pharmacy & Therapeutics and Medical Executive Committees, the antimicrobial dosage will be adjusted accordingly.  Current antimicrobial dosage: Cefepime 2g IV q12h  Indication: Empiric abx, fever to 100.4 past 24h  Renal Function:  Estimated Creatinine Clearance: 65.2 mL/min (by C-G formula based on SCr of 1.06 mg/dL). []      On intermittent HD, scheduled: []      On CRRT    Antimicrobial dosage has been changed to:  Cefepime 2g IV q8h since Cr >37ml/min  Thank you for allowing pharmacy to be a part of this patient's care.  Joetta Manners, PharmD, [facility] Emergency [facility] Clinical Pharmacist ED RPh Phone: [place]: [phone]

## 2021-03-23 NOTE — Procedures (Addendum)
Patient Name: Martie Fulgham  MRN: [id number]  Epilepsy Attending: Lora Havens  Referring Physician/Provider: Dr Kerney Elbe Duration: 03/22/2021 9432 03/23/2021 0956   Patient history: 70 year old male presented after cardiac arrest.  EEG to evaluate for seizures.   Level of alertness: Comatose   AEDs during EEG study: Propofol   Technical aspects: This EEG study was done with scalp electrodes positioned according to the 10-20 International system of electrode placement. Electrical activity was acquired at a sampling rate of 500Hz  and reviewed with a high frequency filter of 70Hz  and a low frequency filter of 1Hz . EEG data were recorded continuously and digitally stored.   Description: EEG showed near continuous generalized background attenuation.  At the beginning of the study brief intermittent bifrontal 3 to 5 Hz theta-delta slowing was also noted.  EEG was not reactive to noxious stimulation.  Hyperventilation and photic stimulation were not performed.      ABNORMALITY - Background attenuation, generalized   IMPRESSION: This study is suggestive of profound diffuse encephalopathy, nonspecific to etiology but could be secondary to sedation, anoxic/hypoxic brain injury.  No seizures or epileptiform discharges were seen throughout the recording.   Meshell Abdulaziz Barbra Sarks

## 2021-03-23 NOTE — Progress Notes (Signed)
Pt transported on vent. to MRI and back to 8E82, without complications. RN at bedside, RT will continue to monitor.

## 2021-03-23 NOTE — Progress Notes (Signed)
LTM EEG discontinued - no skin breakdown at [facility]. Mild skin irritation.

## 2021-03-23 NOTE — Progress Notes (Signed)
Nutrition Brief Note  Chart reviewed d/t vent status. Patient with extensive hypoxemic/ischemic brain injury per MRI. Plans to withdraw care later today. Nutrition interventions not indicated. Please consult nutrition if overall plans change and nutrition needs are identified.   Lucas Mallow, RD, LDN, CNSC Please refer to [facility] for contact information.

## 2021-03-23 NOTE — Progress Notes (Signed)
ETT pulled by RT for one-way extubation. Morphine drip started. Ex-wife [name] at bedside with sister, Olegario Shearer. Continuing to monitor patient.

## 2021-03-23 NOTE — Procedures (Signed)
Extubation Procedure Note  Patient Details:   Name: Travis Salinas DOB: 12/05/1950 MRN: [id number]   Airway Documentation:    Vent end date: 03/23/21 Vent end time: 2150   Evaluation  O2 sats: stable throughout Complications: No apparent complications Patient did tolerate procedure well. Bilateral Breath Sounds: Diminished, Rhonchi   No  [place] G Doha Boling 03/23/2021, 10:02 PM

## 2021-03-23 NOTE — Progress Notes (Signed)
Chaplain Lovelle prayed with this patient a short time ago.    Upon change of shift, RN called with request for a priest to visit because their parish priest ([place]) was not responding.  This chaplain contacted [facility] [place] regarding the request. Fr. Antonio had already received the family's request.  He had just returned from a funeral in [place] and was preparing to do mass.  He will come to provide Commendation of the Dying (aka Last Rites) after his mass sometime after 6 PM.    Please contact our office for ongoing support.  Luana Shu [phone]       03/23/21 1700  Clinical Encounter Type  Visited With Other (Comment) (Chaplain Lovelle with pt/family)  Referral From Nurse  Consult/Referral To Chaplain  Spiritual Encounters  Spiritual Needs Prayer;Ritual  Stress Factors  Patient Stress Factors Health changes  Family Stress Factors Major life changes;Loss of control

## 2021-03-23 NOTE — Progress Notes (Addendum)
NAME:  Travis Salinas, MRN:  [id number], DOB:  Apr 28, 1951, LOS: 2 ADMISSION DATE:  04/02/2021, CONSULTATION DATE:  7/13 REFERRING MD:  Dr. Sabra Heck, CHIEF COMPLAINT:  post cardiac arrest   History of Present Illness:  Patient is a 70 yo M w/ pertinent PMH of COPD, bipolar, HTN, hepatitis c, hx of prior stroke, chronic pain syndrome (takes oxy) presenting to [facility] on 7/13 post cardiac arrest.  Patient recently admitted to [facility] 1 month ago for CAP. Been in pulm rehab multiple times for his COPD. Patient also has chronic pain syndrome and arthritis which he takes oxy for. He also has history of bipolar disorder on several medications including xanax. History of substance abuse with heroin and cocaine. Last echo 04/2020 EF 60-65%.  On 7/13, patient woke up earlier in the morning. Went back to his room for a nap and was found unresponsive at 1255 by his roommate. He was breathing but no pulse. CPR started by roommate. Paramedics arrived and patient not breathing, continued CPR, rhythm asystole. 3 doses of epi given. Pulse returned with a bradycardic rhythm. Placed on temporary transcutaneous pacer and started on epi drip.   ED course 7/13: pulses present but non responsive. King airway in place and switched out for ETT. Fem line placed. Epi drip stopped and started on Levo for hypotension. Pupils 5 mm and symmetrical, non-reactive. Eyelid fasciculations present. Cough/gag reflex present. Breathing spontaneously. Biting on ET tube. Started on propofol. HR 80s and regular. WBC 22,000. Lactic acid over 8.   PCCM consulted for admit to ICU and medical management.   Pertinent  Medical History   Past Medical History:  Diagnosis Date   Arthritis    Cervical disc herniation    COPD (chronic obstructive pulmonary disease) ([place])    Headache    " due to antibiotics"   Hepatitis C    Hypertension    Significant Hospital Events: Including procedures, antibiotic start and stop dates in addition to other  pertinent events   7/13: admitted to [facility] post cardiac arrest; weaned off levo; intubated; CT head negative. Keppra and propofol for suspected myoclonic seizures.  Neurology consulted. On Unasyn.  7/14: EEG in place; Echo EF 55-60%, mild LVH, Grade I DD, elevated pulmonary artery pressures. Abx changed and broadened to Cefepime and Linezolid.  7/15: MRI Brain for prognostication. D/c Linezolid.  Interim History / Subjective:   Intermittent myoclonic jerks. Neurology on board, EEG with profound diffuse encephalopathy. Febrile to 100.5 yesterday, Blood cx collected on admission negative thus far.   Objective   Blood pressure (!) 164/64, pulse (!) 119, temperature 98.42 F (36.9 C), resp. rate (!) 21, height 5\' 10"  (1.778 m), weight 69.1 kg, SpO2 96 %.    Vent Mode: PRVC FiO2 (%):  [40 %-50 %] 40 % Set Rate:  [14 bmp-18 bmp] 14 bmp Vt Set:  [580 mL] 580 mL PEEP:  [5 cmH20] 5 cmH20 Plateau Pressure:  [9 cmH20-16 cmH20] 16 cmH20   Intake/Output Summary (Last 24 hours) at 03/23/2021 0720 Last data filed at 03/23/2021 0631 Gross per 24 hour  Intake 3325.54 ml  Output 1970 ml  Net 1355.54 ml   Filed Weights   03/28/2021 2000 03/22/21 0404 03/23/21 0530  Weight: 67.9 kg 67.9 kg 69.1 kg    Examination:  General:  critically ill appearing male on vent, slightly responsive to noxious-stimuli HEENT: MM pink/moist; ETT in place Neuro: propofol paused for neuro exam: patient appears to be initiating breaths; mild myoclonic jerking noted  left lower extremity and left eyelid; withdrawals to noxious stimuli on right-side more than left, +upwards Bells phenomenon, mild corneal reflex only on right-side; pupils 2 mm bilaterally with sluggish response to light; mild gag reflex apparent, negative dolls eye, no plantar reflex  CV: Tachycardic PULM:  PRVC with PEEP 5, rate 14.  Extremities: warm/dry, no edema  Skin: no rashes or lesions  Labs Cr 1.06, electrolytes stable WBC 26.6, Hgb  11.4 Procalcitonin 0.13 CXR: Endotracheal tube and orogastric or nasogastric tube well positioned. Chronic lung disease without acute focal finding Echo: EF 55-60%, mild LVH, elevated arterial systolic pressure EEG: with profound diffuse encephalopathy   Resolved Hospital Problem list   AKI  Assessment & Plan:  S/p cardiac arrest Anoxic Brain Injury  Likely respiratory suppression related due to over medication; UDS positive for benzos and cocaine. No documented down time. P: -continuous telemetry monitoring -continue IV fluids -MRI brain for prognostication -Continue to discuss [place] with family pending MRI results -Per family, keep Full Code until MRI results return; likely to withdrawal care if imaging shows diffuse and severe damage  Acute respiratory failure with hypoxia requiring mechanical ventilation COPD P: -continue full mech vent support 8cc/kg -No plan for extubation given critical state -RASS goal 0 to -1 -On PRVC, previously went apneic when switched to PSV -continue propofol; prn fentanyl for sedation -takes anoro at home: yupelri and brovana ordered -prn duoneb for wheezing  Myoclonic Jerking: CT head 7/13 negative; EEG with profound diffuse encephalopathy, without seizures or epileptiform discharges seen. P: -Neurology following, appreciate care and recommendations -Lamictal 200 mg daily per tube -Keppra 1000 mg IV BID -Propofol titration to resolution of myoclonus  -neuro checks  Elevated Troponin Trop 35>126. Likely demand from cardiac arrest. -Trend Trop q4h to peak   Hyperammonemia Ammonia slightly elevated to 43 on 7/13.  P: -Continue Lactulose, can increase tomorrow if no BM   Leukocytosis WBC 25.7>26.6. Likely stress reaction s/p arrest. Procalcitonin is WNL thus less-likely bacterial infection. Febrile last night, Tmax 100.5, could be autonomic dysregulation. Blood cultures with no growth to date.  P: -Trend CBC/fever -Discontinue  linezolid -Continue Cefepime -Continue to follow blood cultures -Consider repeat blood cx/CXR if patient has repeat fever  Chronic pain syndrome P: -Hold home oxy -PRN fentanyl  Bipolar disorder present on admissin P: -continue Abilify, Lamictal -hold topiramate and klonopin  Chronic HTN, HLD BP ranging 138-195/55-97. Home medications include Losartan 50 mg daily, Metoprolol-XL 25 mg daily P:  -Scheduled Hydralazine 10 mg TID  -PRN hydralazine 10-20 mg q4h PRN with goal SPB <170 -Hold Losartan given recent AKI  -Hold Metoprolol given hx of cocaine use  Chronic Hepatitis C AST slightly elevated to 79 on admission. ALT 42.  P: -supportive care -Consider holding statin  Best Practice (right click and "Reselect all SmartList Selections" daily)   Diet/type: NPO w/ meds via tube DVT prophylaxis: prophylactic heparin  GI prophylaxis: PPI Lines: Central line Foley:  Yes, and it is still needed Code Status:  full code Last date of multidisciplinary goals of care discussion: 7/15. Keep full code until MRI results. Per brother, patient would not want feeding tube/trach. If MRI shows diffuse and severe brain damage will likely switch to DNR and comfort care but family would like to see results prior to making any decisions.  Critical care time:    Sharion Settler PGY-2 Family Medicine   Please see [email or web address] for pager details.  From 7A-7P if no response, please call [phone]. After hours, please call  Warren Lacy [phone].

## 2021-03-23 NOTE — Progress Notes (Signed)
Pharmacy Antibiotic Note  Travis Salinas Travis Salinas is a 70 y.o. male admitted on 03/26/2021 with  s/p cardiac arrest .  Pharmacy has been consulted for cefepime dosing.  Plan: Cefepime 2g IV q8h  Height: 5\' 10"  (177.8 cm) Weight: 69.1 kg (152 lb 5.4 oz) IBW/kg (Calculated) : 73  Temp (24hrs), Avg:98.9 F (37.2 C), Min:97.16 F (36.2 C), Max:100.58 F (38.1 C)  Recent Labs  Lab 03/30/2021 1411 04/08/2021 1811 03/22/21 0335 03/22/21 2036 03/23/21 0400  WBC 22.7*  --  25.7*  --  26.6*  CREATININE 2.13*  --  1.53*  --  1.06  LATICACIDVEN 8.7* 5.9*  --  0.7  --     Estimated Creatinine Clearance: 65.2 mL/min (by C-G formula based on SCr of 1.06 mg/dL).    Allergies  Allergen Reactions   Propoxyphene Nausea And Vomiting   Ciprofloxacin Nausea Only   Depakote [Divalproex Sodium] Other (See Comments)    hallucinations   Amoxicillin Nausea And Vomiting    Tolerated cefazolin    Antimicrobials this admission: Cefepime 7/13 >>  Linezolid 7/13 >> 7/14  Dose adjustments this admission: Cefepime 2g q12h > 2g q8h  Microbiology results: 7/13 BCx: NGTD  Thank you for allowing pharmacy to be a part of this patient's care.  Joetta Manners, PharmD, [facility] Emergency Medicine Clinical Pharmacist ED RPh Phone: [place]: [phone]

## 2021-03-23 NOTE — Progress Notes (Addendum)
Spoke with [employer] with [facility] and she informed me that patient will not be a donor d/t his age and other medical factors. Family is aware. Prince Rome, another [employer], is en route to hospital to prepare hand molds for family. Family will let me know when to proceed with extubation.

## 2021-03-23 NOTE — Progress Notes (Signed)
PCCM Interval Note  MRI Brain with changes suggestive of extensive hypoxemic/ischemic injury associated with parenchymal swelling. At this point, meaningful recovery is unlikely. Discussed imaging with patient's brother his 79. Brother states patient would not want to be on prolonged life support. He wishes to withdraw care after he speaks to patient's ex-wife. Agrees to DNR status and plan for withdrawal of care later today.  Rodman Pickle, M.D. [facility] Pulmonary/Critical Care Medicine 03/23/2021 2:39 PM   See Amion for personal pager For hours between 7 PM to 7 AM, please call Elink for urgent questions

## 2021-03-23 NOTE — Progress Notes (Signed)
Subjective: had rhyhtmiuc left foot twitching after reducing propofol. Ex wife at bedside.  ROS: Unable to obtain due to poor mental status  Examination  Vital signs in last 24 hours: Temp:  [97.16 F (36.2 C)-100.58 F (38.1 C)] 97.88 F (36.6 C) (07/15 1406) Pulse Rate:  [87-126] 110 (07/15 1406) Resp:  [17-28] 17 (07/15 1406) BP: (145-200)/(55-97) 188/81 (07/15 1200) SpO2:  [95 %-98 %] 95 % (07/15 1406) FiO2 (%):  [40 %] 40 % (07/15 1113) Weight:  [69.1 kg] 69.1 kg (07/15 0530)  General: lying in bed, not in apparent distress CVS: pulse-normal rate and rhythm RS: Intubated, initiating breaths on vent Extremities: normal, warm Neuro: On propofol at 70 MCG per hour, doesnt opens eyes to noxious stimuli, does not follow commands, PERRLA, no forced gaze deviation, corneal reflex absent on left, present on right, gag  reflex present, withdraws to noxious stimuli in bilateral upper extremities, no movement in bilateral lower extremities, hyporeflexic with mute plantars, left foot rhythmic twitching consistent with myoclonic seizure after holding propofol  Basic Metabolic Panel: Recent Labs  Lab 04/01/2021 1411 03/25/2021 1519 04/03/2021 1713 03/22/21 0335 03/23/21 0400  NA 141 139  --  140 139  K 5.0 5.1  --  4.2 3.6  CL 108  --   --  108 108  CO2 21*  --   --  25 24  GLUCOSE 130*  --   --  120* 138*  BUN 24*  --   --  28* 22  CREATININE 2.13*  --   --  1.53* 1.06  CALCIUM 8.5*  --   --  8.1* 8.7*  MG  --   --  2.0 1.9 2.1    CBC: Recent Labs  Lab 03/27/2021 1411 03/20/2021 1519 03/22/21 0335 03/23/21 0400  WBC 22.7*  --  25.7* 26.6*  NEUTROABS 15.7*  --   --   --   HGB 10.7* 12.6* 11.0* 11.4*  HCT 39.5 37.0* 35.8* 36.9*  MCV 101.0*  --  91.8 90.4  PLT 257  --  225 203     Coagulation Studies: Recent Labs    03/26/2021 1411  LABPROT 16.9*  INR 1.4*    Imaging  MRI brain wo contrast 03/23/2021: Extensive diffusion-weighted and T2/FLAIR hyperintense signal  abnormality within the supratentorial and infratentorial brain, compatible with acute hypoxic/ischemic injury. Associated parenchymal swelling, most notably involving the deep gray nuclei and cerebellum. Partial effacement of the lateral and third ventricles without midline shift. The basal cisterns remain patent. Posterior fossa mass effect with ventral displacement of the brainstem. No cerebellar tonsillar herniation at this time.       ASSESSMENT AND PLAN: 70 year old male with history of bipolar disorder on lamotrigine, prior stroke, chronic pain syndrome on opioids who presented after cardiorespiratory arrest.  He was noted to have intermittent leg jerking concerning for posterior and started on Keppra and propofol.   Cardiorespiratory arrest Anoxic/hypoxic brain injury Postanoxic myoclonic status epilepticus Acute encephalopathy, post anoxic Cocaine use disorder -Has myoclonic seizures after stopping propofol  Recommendations -Continue propofol for seizure supppression -Continue Keppra 1000mg  twice daily and lamotrigine 200 mg every morning -Prolonged downtime, myoclonic status epilepticus and significant injury on MRI brain are suggestive of significant irreversible brain injury with minimal to no chances of meaningful neurological recovery.   -I spoke with patient's ex-wife at bedside and patient's brother on phone who live in [place]. Family states patient has clearly mentioned in the past that he would not want to be in  a long-term course facility for a long time, would not want trach/PEG.   -Critical care team will discuss mri results and prognosis with family  this afternoon. From my previous conversations, I anticipate Family will most likely consider transitioning to comfort care  -Management of rest of comorbidities per primary team   CRITICAL CARE Performed by: Lora Havens     Total critical care time: 61minutes   Critical care time was exclusive of separately  billable procedures and treating other patients.   Critical care was necessary to treat or prevent imminent or life-threatening deterioration.   Critical care was time spent personally by me on the following activities: development of treatment plan with patient and/or surrogate as well as nursing, discussions with consultants, evaluation of patient's response to treatment, examination of patient, obtaining history from patient or surrogate, ordering and performing treatments and interventions, ordering and review of laboratory studies, ordering and review of radiographic studies, pulse oximetry and re-evaluation of patient's condition.   Zeb Com[facility] Epile[facility] For questions after 5pm please refer to AMION to reach the Neurologist on call

## 2021-03-24 ENCOUNTER — Other Ambulatory Visit (HOSPITAL_COMMUNITY): Payer: Self-pay

## 2021-03-26 LAB — CULTURE, BLOOD (ROUTINE X 2)
Culture: NO GROWTH
Culture: NO GROWTH
Special Requests: ADEQUATE

## 2021-04-09 NOTE — Death Summary Note (Addendum)
DEATH SUMMARY   Patient Details  Name: Travis Salinas MRN: [id number] DOB: [birth year]  Admission/Discharge Information   Admit Date:  04/15/2021  Date of Death: Date of Death: 18-Apr-2021  Time of Death: Time of Death: 12-20-40  Length of Stay: 3  Referring Physician: Kathyrn Lass, MD   Reason(s) for Hospitalization  Cardiac arrest  Diagnoses  Preliminary cause of death:  Secondary Diagnoses (including complications and co-morbidities):  Principal Problem:   Cardiac arrest with successful resuscitation [facility]) Active Problems:   Depression with anxiety   Chronic hepatitis C without hepatic coma ([place])   Liver fibrosis   Leukocytosis   Acute respiratory failure (HCC)   Normocytic anemia   COPD (chronic obstructive pulmonary disease) ([place])   AKI (acute kidney injury) ([place])   Hypoalbuminemia due to protein-calorie malnutrition ([place])   Benign prostatic hyperplasia   Chronic combined systolic (congestive) and diastolic (congestive) heart failure (HCC)   Chronic pain syndrome   History of CVA (cerebrovascular accident)   Shock, unspecified ([place])   Acute metabolic encephalopathy   Lactic acidosis   Hypothermia Septic shock - ruled out  [place] [facility] Course (including significant findings, care, treatment, and services provided and events leading to death)  Travis Salinas is a 70 y.o. year old male with COPD, bipolar, hepatitis C, CVA, hx polysubstance abuse and chronic pain syndrome admitted for post-cardiac arrest on 04-16-23 after being found unresponsive by his roommate. He was found pulseless and underwent 3 rounds of CPR with ROSC. UDS + cocaine, opiates and benzos. He was intubated in the field and started on temporary transcutaneous pacer for bradycardia. In the ED his airway was exchanged for endotracheal tube. Physical exam concerning for anoxic brain injury. MRI Brain demonstrated extensive hypoxemic brain injury with parenchymal swelling. Discussed imaging with patient's  brother his 80. Brother states patient would not want to be on prolonged life support. He decided to withdraw care. Pt expired on 04/18/2021 at 0042.  Cause of death: Cerebral edema secondary to anoxic brain injury secondary in setting cardiac arrest secondary to hypoxemia secondary to cocaine/narcotic use  Pertinent Labs and Studies  Significant Diagnostic Studies DG Chest 2 View  Result Date: 03/02/2021 CLINICAL DATA:  Follow-up right upper lobe pneumonia. EXAM: CHEST - 2 VIEW COMPARISON:  February 17, 2021 FINDINGS: Very mild, residual right upper lobe infiltrate is seen. This is decreased in severity when compared to the prior study. Emphysematous lung disease and mild left suprahilar atelectasis are also noted. There is no evidence of a pleural effusion or pneumothorax. The heart size and mediastinal contours are within normal limits. The visualized skeletal structures are unremarkable. IMPRESSION: Very mild residual right upper lobe infiltrate. Electronically Signed   By: Virgina Norfolk M.D.   On: 03/02/2021 23:12   CT Head Wo Contrast  Result Date: 2021-04-15 CLINICAL DATA:  Mental status change. EXAM: CT HEAD WITHOUT CONTRAST TECHNIQUE: Contiguous axial images were obtained from the base of the skull through the vertex without intravenous contrast. COMPARISON:  06/23/2020 FINDINGS: Brain: The ventricles are in the midline without mass effect or shift. No extra-axial fluid collections are identified. No CT findings for acute intracranial process such as hemispheric infarction or intracranial hemorrhage. The gray-white differentiation is maintained. No mass lesions are identified. The brainstem and cerebellum are grossly normal in stable. Vascular: Age advanced vascular calcifications but no aneurysm or hyperdense vessels. Skull: No skull fracture or bone lesions. Sinuses/Orbits: Mucous retention cyst or polyp in the left maxillary sinus. The other  paranasal sinuses and mastoid air cells are  clear. Other: No scalp lesions or scalp hematoma. IMPRESSION: No acute intracranial findings or mass lesions. Electronically Signed   By: Marijo Sanes M.D.   On: 03/31/2021 15:38   MR BRAIN WO CONTRAST  Result Date: 03/23/2021 CLINICAL DATA:  Anoxic brain damage. EXAM: MRI HEAD WITHOUT CONTRAST TECHNIQUE: Multiplanar, multiecho pulse sequences of the brain and surrounding structures were obtained without intravenous contrast. COMPARISON:  Prior head CT examinations 03/23/2021 and earlier. MRI/MRA head 01/03/2020. FINDINGS: Brain: Mild generalized cerebral atrophy. There is extensive diffusion-weighted and T2/FLAIR hyperintense signal abnormality within the bilateral cerebral cortex, deep gray nuclei, hippocampi and cerebellum. Additionally, diffusion-weighted and T2/FLAIR hyperintense signal abnormality is present within the white matter tracts within the brainstem and within the periaqueductal gray matter. Associated parenchymal swelling, most notably affecting the deep gray nuclei and cerebellum. Findings are compatible with acute hypoxic/ischemic injury. Partial effacement of the lateral and third ventricles without midline shift. The basal cisterns remain patent. Posterior fossa mass effect with ventral displacement of the brainstem. No cerebellar tonsillar herniation at this time. Mild multifocal T2/FLAIR hyperintensity within the cerebral white matter, nonspecific but compatible with chronic small vessel ischemic disease. Chronic lacunar infarcts within the right lentiform nucleus and left thalamus. Chronic lacunar infarct within the right frontal lobe periventricular white matter/callosal body. No evidence of an intracranial mass. No chronic intracranial blood products. No extra-axial fluid collection. Vascular: Expected proximal arterial flow voids. Skull and upper cervical spine: No focal marrow lesion. Sinuses/Orbits: Visualized orbits show no acute finding. 17 mm left maxillary sinus mucous retention  cyst. These results will be called to the ordering clinician or representative by the Radiologist Assistant, and communication documented in the PACS or [employer]. IMPRESSION: Extensive diffusion-weighted and T2/FLAIR hyperintense signal abnormality within the supratentorial and infratentorial brain, compatible with acute hypoxic/ischemic injury. Associated parenchymal swelling, most notably involving the deep gray nuclei and cerebellum. Partial effacement of the lateral and third ventricles without midline shift. The basal cisterns remain patent. Posterior fossa mass effect with ventral displacement of the brainstem. No cerebellar tonsillar herniation at this time. Electronically Signed   By: Kellie Simmering DO   On: 03/23/2021 13:56   DG Chest Port 1 View  Result Date: 03/22/2021 CLINICAL DATA:  Acute respiratory failure with hypoxia. EXAM: PORTABLE CHEST 1 VIEW COMPARISON:  03/11/2021 FINDINGS: Endotracheal tube tip 4.5 cm above the carina. Orogastric or nasogastric tube enters the stomach. Chronic lung disease with emphysema and interstitial scarring as seen previously. No consolidation, collapse or effusion. IMPRESSION: Endotracheal tube and orogastric or nasogastric tube well positioned. Chronic lung disease without acute focal finding. Electronically Signed   By: Nelson Chimes M.D.   On: 03/22/2021 07:38   DG Chest Port 1 View  Result Date: 03/11/2021 CLINICAL DATA:  Unresponsive.  Status post CPR.  Intubation. EXAM: PORTABLE CHEST 1 VIEW COMPARISON:  Chest x-ray 03/01/2021 FINDINGS: Heart size is normal. Gastric tube extends off the inferior course the film. Endotracheal tube terminates just above the clavicles approximately 10 cm from the carina. Heart size is normal. Atherosclerotic changes are noted at the aortic arch. Moderate pulmonary vascular congestion is present without frank edema. IMPRESSION: 1. Endotracheal tube terminates just above the clavicles approximately 10 cm from the carina.  2. Moderate pulmonary vascular congestion without frank edema. 3. Atherosclerosis of the aortic arch. Electronically Signed   By: San Morelle M.D.   On: 03/09/2021 15:19   EEG adult  Result Date: 03/22/2021 Zeb Comfort  Jenetta Downer, MD     03/22/2021  8:21 AM Patient Name: Merek Niu MRN: [id number] Epilepsy Attending: Lora Havens Referring Physician/Provider: Andres Labrum, PA Date:03/22/2021 Duration: 22.58 mins Patient history: 70 year old male presented after cardiac arrest.  EEG to evaluate for seizures. Level of alertness: Comatose AEDs during EEG study: Propofol Technical aspects: This EEG study was done with scalp electrodes positioned according to the 10-20 International system of electrode placement. Electrical activity was acquired at a sampling rate of 500Hz  and reviewed with a high frequency filter of 70Hz  and a low frequency filter of 1Hz . EEG data were recorded continuously and digitally stored. Description: EEG showed continuous generalized background attenuation.  EEG was not reactive to noxious stimulation.  Hyperventilation and photic stimulation were not performed.   ABNORMALITY - Background attenuation, generalized IMPRESSION: This study is suggestive of profound diffuse encephalopathy, nonspecific to etiology but could be secondary to sedation, anoxic/hypoxic brain injury.  No seizures or epileptiform discharges were seen throughout the recording. Priyanka Barbra Sarks   Overnight EEG with video  Result Date: 03/23/2021 Lora Havens, MD     03/23/2021  2:10 PM Patient Name: Kanishk Stroebel MRN: [id number] Epilepsy Attending: Lora Havens Referring Physician/Provider: Dr Kerney Elbe Duration: 03/22/2021 0321 03/23/2021 0956  Patient history: 70 year old male presented after cardiac arrest.  EEG to evaluate for seizures.  Level of alertness: Comatose  AEDs during EEG study: Propofol  Technical aspects: This EEG study was done with scalp electrodes positioned according to the  10-20 International system of electrode placement. Electrical activity was acquired at a sampling rate of 500Hz  and reviewed with a high frequency filter of 70Hz  and a low frequency filter of 1Hz . EEG data were recorded continuously and digitally stored.  Description: EEG showed near continuous generalized background attenuation.  At the beginning of the study brief intermittent bifrontal 3 to 5 Hz theta-delta slowing was also noted.  EEG was not reactive to noxious stimulation.  Hyperventilation and photic stimulation were not performed.    ABNORMALITY - Background attenuation, generalized  IMPRESSION: This study is suggestive of profound diffuse encephalopathy, nonspecific to etiology but could be secondary to sedation, anoxic/hypoxic brain injury.  No seizures or epileptiform discharges were seen throughout the recording.  Lora Havens   ECHOCARDIOGRAM COMPLETE  Result Date: 03/22/2021    ECHOCARDIOGRAM REPORT   Patient Name:   MARCY BOGOSIAN ST CLAIR Date of Exam: 03/22/2021 Medical Rec #:  [id number]          Height:       70.0 in Accession #:    [id number]         Weight:       149.7 lb Date of Birth:  [birth year]          BSA:          1.845 m Patient Age:    83 years           BP:           137/66 mmHg Patient Gender: M                  HR:           84 bpm. Exam Location:  Inpatient Procedure: 2D Echo, Cardiac Doppler and Color Doppler Indications:    Cardiac arrest I46.9  History:        Patient has prior history of Echocardiogram examinations, most  recent 04/19/2020. COPD; Risk Factors:Hypertension.  Sonographer:    Bernadene Person RDCS Referring Phys: [id number] Freddi Starr  Sonographer Comments: Echo performed with patient supine and on artificial respirator. IMPRESSIONS  1. Left ventricular ejection fraction, by estimation, is 55 to 60%. The left ventricle has normal function. The left ventricle has no regional wall motion abnormalities. There is mild left ventricular hypertrophy. Left  ventricular diastolic parameters are consistent with Grade I diastolic dysfunction (impaired relaxation).  2. Right ventricular systolic function is normal. The right ventricular size is normal. There is mildly elevated pulmonary artery systolic pressure. The estimated right ventricular systolic pressure is 25.9 mmHg.  3. The mitral valve is normal in structure. No evidence of mitral valve regurgitation. No evidence of mitral stenosis.  4. The aortic valve is tricuspid. Aortic valve regurgitation is not visualized. No aortic stenosis is present.  5. Aortic dilatation noted. There is mild dilatation of the aortic root, measuring 39 mm.  6. The inferior vena cava is normal in size with <50% respiratory variability, suggesting right atrial pressure of 8 mmHg. FINDINGS  Left Ventricle: Left ventricular ejection fraction, by estimation, is 55 to 60%. The left ventricle has normal function. The left ventricle has no regional wall motion abnormalities. The left ventricular internal cavity size was normal in size. There is  mild left ventricular hypertrophy. Left ventricular diastolic parameters are consistent with Grade I diastolic dysfunction (impaired relaxation). Right Ventricle: The right ventricular size is normal. No increase in right ventricular wall thickness. Right ventricular systolic function is normal. There is mildly elevated pulmonary artery systolic pressure. The tricuspid regurgitant velocity is 2.89  m/s, and with an assumed right atrial pressure of 8 mmHg, the estimated right ventricular systolic pressure is 56.3 mmHg. Left Atrium: Left atrial size was normal in size. Right Atrium: Right atrial size was normal in size. Pericardium: There is no evidence of pericardial effusion. Mitral Valve: The mitral valve is normal in structure. There is mild calcification of the mitral valve leaflet(s). Mild mitral annular calcification. No evidence of mitral valve regurgitation. No evidence of mitral valve stenosis.  Tricuspid Valve: The tricuspid valve is normal in structure. Tricuspid valve regurgitation is trivial. Aortic Valve: The aortic valve is tricuspid. Aortic valve regurgitation is not visualized. No aortic stenosis is present. Pulmonic Valve: The pulmonic valve was normal in structure. Pulmonic valve regurgitation is trivial. Aorta: Aortic dilatation noted. There is mild dilatation of the aortic root, measuring 39 mm. Venous: The inferior vena cava is normal in size with less than 50% respiratory variability, suggesting right atrial pressure of 8 mmHg. IAS/Shunts: No atrial level shunt detected by color flow Doppler.  LEFT VENTRICLE PLAX 2D LVIDd:         4.50 cm  Diastology LVIDs:         3.30 cm  LV e' medial:    6.31 cm/s LV PW:         0.80 cm  LV E/e' medial:  9.2 LV IVS:        0.90 cm  LV e' lateral:   8.32 cm/s LVOT diam:     2.20 cm  LV E/e' lateral: 6.9 LV SV:         61 LV SV Index:   33 LVOT Area:     3.80 cm  RIGHT VENTRICLE RV S prime:     15.60 cm/s TAPSE (M-mode): 2.1 cm LEFT ATRIUM           Index  RIGHT ATRIUM           Index LA diam:      2.90 cm 1.57 cm/m  RA Area:     16.60 cm LA Vol (A2C): 40.8 ml 22.11 ml/m RA Volume:   43.40 ml  23.52 ml/m LA Vol (A4C): 39.2 ml 21.24 ml/m  AORTIC VALVE LVOT Vmax:   82.90 cm/s LVOT Vmean:  51.600 cm/s LVOT VTI:    0.161 m  AORTA Ao Root diam: 3.90 cm Ao Asc diam:  3.20 cm MITRAL VALVE               TRICUSPID VALVE MV Area (PHT): 4.68 cm    TR Peak grad:   33.4 mmHg MV Decel Time: 162 msec    TR Vmax:        289.00 cm/s MV E velocity: 57.80 cm/s MV A velocity: 81.00 cm/s  SHUNTS MV E/A ratio:  0.71        Systemic VTI:  0.16 m                            Systemic Diam: 2.20 cm Loralie Champagne MD Electronically signed by Loralie Champagne MD Signature Date/Time: 03/22/2021/4:09:38 PM    Final     Microbiology Recent Results (from the past 240 hour(s))  Resp Panel by RT-PCR (Flu A&B, Covid) Nasopharyngeal Swab     Status: None   Collection Time: 03/18/2021   2:35 PM   Specimen: Nasopharyngeal Swab; Nasopharyngeal(NP) swabs in vial transport medium  Result Value Ref Range Status   SARS Coronavirus 2 by RT PCR NEGATIVE NEGATIVE Final    Comment: (NOTE) SARS-CoV-2 target nucleic acids are NOT DETECTED.  The SARS-CoV-2 RNA is generally detectable in upper respiratory specimens during the acute phase of infection. The lowest concentration of SARS-CoV-2 viral copies this assay can detect is 138 copies/mL. A negative result does not preclude SARS-Cov-2 infection and should not be used as the sole basis for treatment or other patient management decisions. A negative result may occur with  improper specimen collection/handling, submission of specimen other than nasopharyngeal swab, presence of viral mutation(s) within the areas targeted by this assay, and inadequate number of viral copies(<138 copies/mL). A negative result must be combined with clinical observations, patient history, and epidemiological information. The expected result is Negative.  Fact Sheet for Patients:  EntrepreneurPulse.com.au  Fact Sheet for Healthcare Providers:  IncredibleEmployment.be  This test is no t yet approved or cleared by the [place] FDA and  has been authorized for detection and/or diagnosis of SARS-CoV-2 by FDA under an Emergency Use Authorization (EUA). This EUA will remain  in effect (meaning this test can be used) for the duration of the COVID-19 declaration under Section 564(b)(1) of the Act, 21 U.S.C.section 360bbb-3(b)(1), unless the authorization is terminated  or revoked sooner.       Influenza A by PCR NEGATIVE NEGATIVE Final   Influenza B by PCR NEGATIVE NEGATIVE Final    Comment: (NOTE) The Xpert Xpress SARS-CoV-2/FLU/RSV plus assay is intended as an aid in the diagnosis of influenza from Nasopharyngeal swab specimens and should not be used as a sole basis for treatment. Nasal washings and aspirates  are unacceptable for Xpert Xpress SARS-CoV-2/FLU/RSV testing.  Fact Sheet for Patients: EntrepreneurPulse.com.au  Fact Sheet for Healthcare Providers: IncredibleEmployment.be  This test is not yet approved or cleared by the [place] FDA and has been authorized for detection and/or diagnosis of SARS-CoV-2 by  FDA under an Emergency Use Authorization (EUA). This EUA will remain in effect (meaning this test can be used) for the duration of the COVID-19 declaration under Section 564(b)(1) of the Act, 21 U.S.C. section 360bbb-3(b)(1), unless the authorization is terminated or revoked.  Performed at [facility], [place] [address] [place], [place] [address]   Culture, blood (Routine X 2) w Reflex to ID Panel     Status: None (Preliminary result)   Collection Time: 04/07/2021  6:29 PM   Specimen: BLOOD LEFT WRIST  Result Value Ref Range Status   Specimen Description BLOOD LEFT WRIST  Final   Special Requests   Final    BOTTLES DRAWN AEROBIC AND ANAEROBIC Blood Culture results may not be optimal due to an inadequate volume of blood received in culture bottles   Culture   Final    NO GROWTH 2 DAYS Performed at [facility], [place] [address], [place], [place] [address]    Report Status PENDING  Incomplete  Culture, blood (Routine X 2) w Reflex to ID Panel     Status: None (Preliminary result)   Collection Time: 03/14/2021 10:53 PM   Specimen: BLOOD LEFT ARM  Result Value Ref Range Status   Specimen Description BLOOD LEFT ARM  Final   Special Requests   Final    BOTTLES DRAWN AEROBIC ONLY Blood Culture adequate volume   Culture   Final    NO GROWTH 2 DAYS Performed at [facility], [place] [address], [place], [place] [address]    Report Status PENDING  Incomplete    Lab Basic Metabolic Panel: Recent Labs  Lab 03/26/2021 1411 04/06/2021 1519 03/29/2021 1713 03/22/21 0335 03/23/21 0400  NA 141 139  --  140 139  K 5.0 5.1  --  4.2 3.6   CL 108  --   --  108 108  CO2 21*  --   --  25 24  GLUCOSE 130*  --   --  120* 138*  BUN 24*  --   --  28* 22  CREATININE 2.13*  --   --  1.53* 1.06  CALCIUM 8.5*  --   --  8.1* 8.7*  MG  --   --  2.0 1.9 2.1   Liver Function Tests: Recent Labs  Lab 04/05/2021 1411  AST 79*  ALT 42  ALKPHOS 109  BILITOT 0.7  PROT 5.8*  ALBUMIN 3.1*   No results for input(s): LIPASE, AMYLASE in the last 168 hours. Recent Labs  Lab 03/18/2021 1829  AMMONIA 43*   CBC: Recent Labs  Lab 04/06/2021 1411 03/15/2021 1519 03/22/21 0335 03/23/21 0400  WBC 22.7*  --  25.7* 26.6*  NEUTROABS 15.7*  --   --   --   HGB 10.7* 12.6* 11.0* 11.4*  HCT 39.5 37.0* 35.8* 36.9*  MCV 101.0*  --  91.8 90.4  PLT 257  --  225 203   Cardiac Enzymes: No results for input(s): CKTOTAL, CKMB, CKMBINDEX, TROPONINI in the last 168 hours. Sepsis Labs: Recent Labs  Lab 03/09/2021 1411 04/05/2021 1811 03/26/2021 1829 03/22/21 0335 03/22/21 2036 03/23/21 0400  PROCALCITON  --   --  0.20 0.33  --  0.13  WBC 22.7*  --   --  25.7*  --  26.6*  LATICACIDVEN 8.7* 5.9*  --   --  0.7  --     Procedures/Operations  ETT 04/02/2021   Jefferey Lippmann Rodman Pickle April 03, 2021, 6:59 AM

## 2021-04-09 NOTE — Progress Notes (Signed)
Pt cardiac time of death: 04-20-2021 @ 0042. Pt passed while accompanied by ex-spouse, brother notified via phone call. Pt appeared comfortable at time of death. Family grieving appropriately and bathed pt before departing. All questions answered. Post-mortem checklist completed.

## 2021-04-09 DEATH — deceased

## 2021-06-01 ENCOUNTER — Ambulatory Visit: Payer: Medicare Other | Admitting: Cardiovascular Disease

## 2021-07-16 ENCOUNTER — Ambulatory Visit: Payer: Medicare Other | Admitting: Adult Health
